# Patient Record
Sex: Female | Born: 1935
Health system: Southern US, Community
[De-identification: ages and names within clinical notes are randomized; demographics above are authoritative.]

## PROBLEM LIST (undated history)

## (undated) DIAGNOSIS — IMO0002 Reserved for concepts with insufficient information to code with codable children: Secondary | ICD-10-CM

## (undated) DIAGNOSIS — E079 Disorder of thyroid, unspecified: Secondary | ICD-10-CM

## (undated) DIAGNOSIS — H409 Unspecified glaucoma: Secondary | ICD-10-CM

## (undated) DIAGNOSIS — B019 Varicella without complication: Secondary | ICD-10-CM

## (undated) DIAGNOSIS — I10 Essential (primary) hypertension: Secondary | ICD-10-CM

## (undated) DIAGNOSIS — G709 Myoneural disorder, unspecified: Secondary | ICD-10-CM

## (undated) DIAGNOSIS — H35039 Hypertensive retinopathy, unspecified eye: Secondary | ICD-10-CM

## (undated) DIAGNOSIS — E119 Type 2 diabetes mellitus without complications: Secondary | ICD-10-CM

## (undated) HISTORY — PX: CATARACT EXTRACTION: SUR2

## (undated) HISTORY — DX: Hypertensive retinopathy, unspecified eye: H35.039

## (undated) HISTORY — PX: EYE SURGERY: SHX253

## (undated) HISTORY — DX: Essential (primary) hypertension: I10

## (undated) HISTORY — DX: Unspecified glaucoma: H40.9

## (undated) HISTORY — DX: Varicella without complication: B01.9

## (undated) HISTORY — DX: Reserved for concepts with insufficient information to code with codable children: IMO0002

## (undated) HISTORY — DX: Type 2 diabetes mellitus without complications: E11.9

## (undated) HISTORY — DX: Myoneural disorder, unspecified: G70.9

## (undated) HISTORY — PX: OOPHORECTOMY: SHX86

## (undated) HISTORY — DX: Disorder of thyroid, unspecified: E07.9

## (undated) HISTORY — PX: OTHER SURGICAL HISTORY: SHX169

---

## 1988-12-28 HISTORY — PX: ABDOMINAL HYSTERECTOMY: SHX81

## 1998-07-17 ENCOUNTER — Other Ambulatory Visit: Admission: RE | Admit: 1998-07-17 | Discharge: 1998-07-17 | Payer: Self-pay | Admitting: Internal Medicine

## 1998-09-24 ENCOUNTER — Other Ambulatory Visit: Admission: RE | Admit: 1998-09-24 | Discharge: 1998-09-24 | Payer: Self-pay | Admitting: Obstetrics and Gynecology

## 1999-09-26 ENCOUNTER — Other Ambulatory Visit: Admission: RE | Admit: 1999-09-26 | Discharge: 1999-09-26 | Payer: Self-pay | Admitting: Obstetrics and Gynecology

## 2000-10-07 ENCOUNTER — Other Ambulatory Visit: Admission: RE | Admit: 2000-10-07 | Discharge: 2000-10-07 | Payer: Self-pay | Admitting: Obstetrics and Gynecology

## 2001-06-23 ENCOUNTER — Ambulatory Visit (HOSPITAL_COMMUNITY): Admission: RE | Admit: 2001-06-23 | Discharge: 2001-06-23 | Payer: Self-pay | Admitting: Internal Medicine

## 2001-06-23 ENCOUNTER — Encounter: Payer: Self-pay | Admitting: Internal Medicine

## 2001-10-20 ENCOUNTER — Other Ambulatory Visit: Admission: RE | Admit: 2001-10-20 | Discharge: 2001-10-20 | Payer: Self-pay | Admitting: Obstetrics and Gynecology

## 2002-06-01 ENCOUNTER — Emergency Department (HOSPITAL_COMMUNITY): Admission: EM | Admit: 2002-06-01 | Discharge: 2002-06-01 | Payer: Self-pay | Admitting: Emergency Medicine

## 2002-09-25 ENCOUNTER — Encounter: Payer: Self-pay | Admitting: Internal Medicine

## 2002-09-25 ENCOUNTER — Ambulatory Visit (HOSPITAL_COMMUNITY): Admission: RE | Admit: 2002-09-25 | Discharge: 2002-09-25 | Payer: Self-pay | Admitting: Internal Medicine

## 2002-10-23 ENCOUNTER — Other Ambulatory Visit: Admission: RE | Admit: 2002-10-23 | Discharge: 2002-10-23 | Payer: Self-pay | Admitting: Obstetrics and Gynecology

## 2003-10-31 ENCOUNTER — Ambulatory Visit (HOSPITAL_COMMUNITY): Admission: RE | Admit: 2003-10-31 | Discharge: 2003-10-31 | Payer: Self-pay | Admitting: Internal Medicine

## 2005-01-07 ENCOUNTER — Other Ambulatory Visit: Admission: RE | Admit: 2005-01-07 | Discharge: 2005-01-07 | Payer: Self-pay | Admitting: Obstetrics and Gynecology

## 2006-08-17 ENCOUNTER — Ambulatory Visit (HOSPITAL_COMMUNITY): Admission: RE | Admit: 2006-08-17 | Discharge: 2006-08-17 | Payer: Self-pay | Admitting: Internal Medicine

## 2008-01-04 ENCOUNTER — Other Ambulatory Visit: Admission: RE | Admit: 2008-01-04 | Discharge: 2008-01-04 | Payer: Self-pay | Admitting: Obstetrics and Gynecology

## 2009-04-29 ENCOUNTER — Ambulatory Visit: Payer: Self-pay | Admitting: Obstetrics and Gynecology

## 2009-09-19 ENCOUNTER — Emergency Department (HOSPITAL_BASED_OUTPATIENT_CLINIC_OR_DEPARTMENT_OTHER): Admission: EM | Admit: 2009-09-19 | Discharge: 2009-09-19 | Payer: Self-pay | Admitting: Emergency Medicine

## 2009-09-19 ENCOUNTER — Ambulatory Visit: Payer: Self-pay | Admitting: Radiology

## 2010-01-24 ENCOUNTER — Ambulatory Visit (HOSPITAL_COMMUNITY): Admission: RE | Admit: 2010-01-24 | Discharge: 2010-01-24 | Payer: Self-pay | Admitting: Internal Medicine

## 2010-05-07 ENCOUNTER — Ambulatory Visit: Payer: Self-pay | Admitting: Obstetrics and Gynecology

## 2010-05-07 ENCOUNTER — Other Ambulatory Visit: Admission: RE | Admit: 2010-05-07 | Discharge: 2010-05-07 | Payer: Self-pay | Admitting: Obstetrics and Gynecology

## 2011-04-03 LAB — URINE MICROSCOPIC-ADD ON

## 2011-04-03 LAB — URINALYSIS, ROUTINE W REFLEX MICROSCOPIC
Glucose, UA: NEGATIVE mg/dL
Hgb urine dipstick: NEGATIVE
Protein, ur: 100 mg/dL — AB
Specific Gravity, Urine: 1.015 (ref 1.005–1.030)
Urobilinogen, UA: 0.2 mg/dL (ref 0.0–1.0)

## 2011-04-03 LAB — BASIC METABOLIC PANEL
BUN: 14 mg/dL (ref 6–23)
Calcium: 10.3 mg/dL (ref 8.4–10.5)
Creatinine, Ser: 0.9 mg/dL (ref 0.4–1.2)
GFR calc Af Amer: >60 mL/min (ref >60)

## 2011-04-03 LAB — DIFFERENTIAL
Basophils Relative: 1 % (ref 0–1)
Eosinophils Absolute: 0.1 10*3/uL (ref 0.0–0.7)
Lymphs Abs: 1.8 10*3/uL (ref 0.7–4.0)
Monocytes Relative: 4 % (ref 3–12)
Neutro Abs: 8.4 10*3/uL — ABNORMAL HIGH (ref 1.7–7.7)
Neutrophils Relative %: 77 % (ref 43–77)

## 2011-04-03 LAB — CBC
Platelets: 166 10*3/uL (ref 150–400)
RBC: 5.21 MIL/uL — ABNORMAL HIGH (ref 3.87–5.11)
WBC: 10.8 10*3/uL — ABNORMAL HIGH (ref 4.0–10.5)

## 2011-05-28 ENCOUNTER — Encounter (INDEPENDENT_AMBULATORY_CARE_PROVIDER_SITE_OTHER): Payer: Medicare Other | Admitting: Obstetrics and Gynecology

## 2011-05-28 DIAGNOSIS — R82998 Other abnormal findings in urine: Secondary | ICD-10-CM

## 2011-05-28 DIAGNOSIS — N8111 Cystocele, midline: Secondary | ICD-10-CM

## 2011-05-28 DIAGNOSIS — N952 Postmenopausal atrophic vaginitis: Secondary | ICD-10-CM

## 2011-05-28 DIAGNOSIS — R350 Frequency of micturition: Secondary | ICD-10-CM

## 2011-06-30 ENCOUNTER — Encounter: Payer: Self-pay | Admitting: *Deleted

## 2012-01-11 ENCOUNTER — Encounter: Payer: Self-pay | Admitting: Obstetrics and Gynecology

## 2012-03-15 ENCOUNTER — Ambulatory Visit (INDEPENDENT_AMBULATORY_CARE_PROVIDER_SITE_OTHER): Payer: Medicare Other | Admitting: Obstetrics and Gynecology

## 2012-03-15 DIAGNOSIS — N898 Other specified noninflammatory disorders of vagina: Secondary | ICD-10-CM

## 2012-03-15 DIAGNOSIS — D4959 Neoplasm of unspecified behavior of other genitourinary organ: Secondary | ICD-10-CM

## 2012-03-15 NOTE — Progress Notes (Signed)
Patient came to see me today with a two-day history of brown spotting from her vagina. She noticed it when she wiped. She is status post TAH/BSO. She does not take HRT.  Exam: Kennon Portela present. External: Within normal limits. BUS: Within normal limits. Vaginal exam: There is a large 3 cm lesion attached to the top of the vagina on the right lateral wall. It is attached by a pedicle. It is brownish and almost looks like an old blood clot but is clearly a lesion which is firmly attached. Cervix and uterus are absent. Bimanual fails to reveal masses.  Assessment: The lesion was grasped with a sponge stick and put on tension. There is really no way to anesthetize the pedicle. The pedicle was cut with a scissors. The patient had no discomfort. There was bleeding. It was controlled with Monsel's and pressure. The lesion was sent to pathology for tissue diagnosis. We will call the patient with the results.

## 2012-06-07 ENCOUNTER — Ambulatory Visit (INDEPENDENT_AMBULATORY_CARE_PROVIDER_SITE_OTHER): Payer: Medicare Other | Admitting: Obstetrics and Gynecology

## 2012-06-07 ENCOUNTER — Encounter: Payer: Self-pay | Admitting: Obstetrics and Gynecology

## 2012-06-07 VITALS — BP 130/78 | Ht 63.0 in | Wt 142.0 lb

## 2012-06-07 DIAGNOSIS — D219 Benign neoplasm of connective and other soft tissue, unspecified: Secondary | ICD-10-CM | POA: Insufficient documentation

## 2012-06-07 DIAGNOSIS — I10 Essential (primary) hypertension: Secondary | ICD-10-CM | POA: Insufficient documentation

## 2012-06-07 DIAGNOSIS — E079 Disorder of thyroid, unspecified: Secondary | ICD-10-CM | POA: Insufficient documentation

## 2012-06-07 DIAGNOSIS — Z780 Asymptomatic menopausal state: Secondary | ICD-10-CM

## 2012-06-07 DIAGNOSIS — M8580 Other specified disorders of bone density and structure, unspecified site: Secondary | ICD-10-CM | POA: Insufficient documentation

## 2012-06-07 DIAGNOSIS — N8111 Cystocele, midline: Secondary | ICD-10-CM

## 2012-06-07 DIAGNOSIS — M899 Disorder of bone, unspecified: Secondary | ICD-10-CM

## 2012-06-07 DIAGNOSIS — R3915 Urgency of urination: Secondary | ICD-10-CM

## 2012-06-07 DIAGNOSIS — IMO0002 Reserved for concepts with insufficient information to code with codable children: Secondary | ICD-10-CM

## 2012-06-07 DIAGNOSIS — R351 Nocturia: Secondary | ICD-10-CM

## 2012-06-07 NOTE — Progress Notes (Signed)
Patient came to see me today for further followup. We have been watching her with a large cystocele. She is aware of it but does not feel require surgical intervention. She does have some urgency of urination. She does have nocturia x2. She does not have incontinence. She does not have dysuria. She still gets hot flashes. She feels there are acceptable without treatment. She is having no vaginal bleeding. She is having no pelvic pain. She does have atrophic vaginitis but once again does not need intervention. She is up-to-date on her mammograms. She does her lab through PCP. She does have osteopenia. Her last bone density was stable without an elevated FRAX risk. She has had no fractures.  ROS: 12 system review done. Pertinent positives above. Other positives are Hypothyroidism and hypertension.  HEENT: Within normal limits. Neck: No masses. Supraclavicular lymph nodes: Not enlarged. Breasts: Examined in both sitting and lying position. Symmetrical without skin changes or masses. Abdomen: Soft no masses guarding or rebound. No hernias. Pelvic: External within normal limits. BUS within normal limits. Vaginal examination shows poor estrogen effect, 2 and a half degree cystocele. Cervix and uterus absent. Adnexa within normal limits. Rectovaginal confirmatory. Extremities within normal limits. Skin: many moles on anterior chest wall. One is elevated and black.  Assessment: #1. Menopausal symptoms #2. Osteopenia #3. Cystocele #4. Atrophic vaginitis #5. Urinary urgency and frequency. #6.moles  of chest wall.  Plan: Continue yearly mammograms. Bone density. Observation of bladder and bladder symptoms. Asked patient to see a dermatologist for moles.

## 2013-01-10 ENCOUNTER — Ambulatory Visit (INDEPENDENT_AMBULATORY_CARE_PROVIDER_SITE_OTHER): Payer: Medicare Other | Admitting: Gynecology

## 2013-01-10 ENCOUNTER — Encounter: Payer: Self-pay | Admitting: Gynecology

## 2013-01-10 DIAGNOSIS — N952 Postmenopausal atrophic vaginitis: Secondary | ICD-10-CM

## 2013-01-10 DIAGNOSIS — N8111 Cystocele, midline: Secondary | ICD-10-CM

## 2013-01-10 NOTE — Patient Instructions (Signed)
Monitor symptoms. If acceptable then follow up in June when you're due for your annual exam. If unacceptable then follow up sooner to consider surgery or pessary use.

## 2013-01-10 NOTE — Progress Notes (Signed)
Patient presents complaining of something bulging from her vagina. Notes that over the last month or so. Does not seem to be getting worse. She does note that she is helping to lift her 120 pound granddaughter and needs help at home due to her sickle cell crises and that seems to coincide with this. No urinary symptoms such as incontinence frequency dysuria or urgency.  She does have noted in her chart from Dr. Eda Paschal a cystocele initially in 2012 and again in 2013 were it's described as a second degree. Per her history she has never really discussed this with Dr. Eda Paschal before.  Exam with kim assistant External BUS vagina with second degree cystocele to the introital opening. Vaginal cuff appears well supported. First degree rectocele. Generalized atrophic changes noted. Bimanual without masses or tenderness. Rectovaginal exam confirms above.  Assessment and plan: Cystocele symptomatic to the patient for the last month following episodes of heavy lifting. I reviewed the pictures with involved with a cystocele. Cuff appears well supported. Does have mild rectocele. Options for management include observation, pessary, surgical repair possible mesh discussed. At this point recommended observation as her symptoms are recent and we'll see how she does over the next month or 2. If she wants to consider pessary she'll represent for fitting. If she's interested in surgery I'm going to refer to urology to consider mesh. She does not have associated urinary symptoms such as incontinence or urgency.

## 2013-06-29 ENCOUNTER — Ambulatory Visit (INDEPENDENT_AMBULATORY_CARE_PROVIDER_SITE_OTHER): Payer: Medicare Other | Admitting: Gynecology

## 2013-06-29 ENCOUNTER — Encounter: Payer: Self-pay | Admitting: Gynecology

## 2013-06-29 VITALS — BP 120/70 | Ht 64.0 in | Wt 136.0 lb

## 2013-06-29 DIAGNOSIS — N816 Rectocele: Secondary | ICD-10-CM

## 2013-06-29 DIAGNOSIS — M8580 Other specified disorders of bone density and structure, unspecified site: Secondary | ICD-10-CM

## 2013-06-29 DIAGNOSIS — M899 Disorder of bone, unspecified: Secondary | ICD-10-CM

## 2013-06-29 DIAGNOSIS — N8111 Cystocele, midline: Secondary | ICD-10-CM

## 2013-06-29 NOTE — Patient Instructions (Signed)
Followup for bone density as scheduled. Call me if you want to pursue treatment for the cystocele.

## 2013-06-29 NOTE — Progress Notes (Signed)
Alejandra Davis 11-10-1936 [id number]        77 y.o.  [id number] for followup exam.    Past medical history,surgical history, medications, allergies, family history and social history were all reviewed and documented in the EPIC chart.  ROS:  Performed and pertinent positives and negatives are included in the history, assessment and plan .  Exam: Kim assistant Filed Vitals:   06/29/13 1439  BP: 120/70  Height: 5\' 4"  (1.626 m)  Weight: 136 lb (61.689 kg)   General appearance  Normal Skin grossly normal Head/Neck normal with no cervical or supraclavicular adenopathy thyroid normal Lungs  clear Cardiac RR, without RMG Abdominal  soft, nontender, without masses, organomegaly or hernia Breasts  examined lying and sitting without masses, retractions, discharge or axillary adenopathy. Pelvic  Ext/BUS/vagina  with atrophic changes. Large cystocele 2 introital opening. Vaginal cuff appears well supported. Slight rectocele on digital exam.  Adnexa  Without masses or tenderness    Anus and perineum  normal   Rectovaginal  normal sphincter tone without palpated masses or tenderness.    Assessment/Plan:  77 y.o. [id number] female for followup exam.   1. Cystocele. Involving the whole anterior vaginal wall. The level of the introitus. Does not appear significantly worse than last exam. Patient still is bothered somewhat with it but not overly bothered. Not having significant urinary symptoms. I again reviewed options of observation, pessary trial and surgery. Recommendation to be evaluated for possible mesh with referral to another physician also discussed. Patient wants to wait and observe at this time. She will call me she was to pursue either pessary or surgical referral. 2. Atrophic vaginal changes. Asymptomatic from an irritated standpoint or dyspareunia. We'll continue to monitor. 3. Osteopenia. DEXA 2008 with T score -1.5. FRAX 6.1%/0.6%. Recommend repeat DEXA now she agrees to schedule. Increase  calcium and vitamin D discussed. 4. Mammography reported January 2014. We do not have a copy of that report and we'll try to get one. Patient clearly murmurs having it done this year. SBE monthly reviewed. Continue with annual mammography. 5. Pap smear 2011. No Pap smear done today. No history of significant abnormal Pap smears. Reviewed current screening guidelines. She's at age 57 and status post hysterectomy for benign indications we both agree on stop screening and she is comfortable with this. 6. Colonoscopy 6 years ago. Plan repeat at 10 year interval. 7. Health maintenance. No blood work done this is all done through her primary physician's office. Followup for bone density. Followup if she wants to pursue treatment of her cystocele. Otherwise followup in 1 year.  Note: This document was prepared with digital dictation and possible smart phrase technology. Any transcriptional errors that result from this process are unintentional.   Dara Lords MD, 3:12 PM 06/29/2013   Her

## 2013-06-30 LAB — URINALYSIS W MICROSCOPIC + REFLEX CULTURE
Bacteria, UA: NONE SEEN
Bilirubin Urine: NEGATIVE
Glucose, UA: NEGATIVE mg/dL
Hgb urine dipstick: NEGATIVE
Protein, ur: NEGATIVE mg/dL
Urobilinogen, UA: 1 mg/dL (ref 0.0–1.0)

## 2013-07-11 ENCOUNTER — Encounter: Payer: Self-pay | Admitting: Gynecology

## 2013-07-11 ENCOUNTER — Ambulatory Visit (INDEPENDENT_AMBULATORY_CARE_PROVIDER_SITE_OTHER): Payer: Medicare Other

## 2013-07-11 DIAGNOSIS — M8580 Other specified disorders of bone density and structure, unspecified site: Secondary | ICD-10-CM

## 2013-07-11 DIAGNOSIS — Z780 Asymptomatic menopausal state: Secondary | ICD-10-CM

## 2013-07-26 ENCOUNTER — Telehealth: Payer: Self-pay | Admitting: *Deleted

## 2013-07-26 NOTE — Telephone Encounter (Signed)
Pt called stating she would like # of urologist per TF note # given for [facility] for pt to make appt. I faxed notes to office.

## 2013-12-18 ENCOUNTER — Other Ambulatory Visit: Payer: Self-pay | Admitting: Gastroenterology

## 2013-12-18 DIAGNOSIS — R109 Unspecified abdominal pain: Secondary | ICD-10-CM

## 2013-12-19 ENCOUNTER — Ambulatory Visit
Admission: RE | Admit: 2013-12-19 | Discharge: 2013-12-19 | Disposition: A | Discharge status: Home / Self Care | Payer: Medicare Other | Source: Ambulatory Visit | Attending: Gastroenterology | Admitting: Gastroenterology

## 2013-12-19 DIAGNOSIS — R109 Unspecified abdominal pain: Secondary | ICD-10-CM

## 2014-08-17 ENCOUNTER — Ambulatory Visit (INDEPENDENT_AMBULATORY_CARE_PROVIDER_SITE_OTHER): Payer: Medicare Other | Admitting: Family Medicine

## 2014-08-17 VITALS — BP 122/80 | HR 64 | Temp 97.4°F | Resp 16 | Ht 63.0 in | Wt 142.4 lb

## 2014-08-17 DIAGNOSIS — R040 Epistaxis: Secondary | ICD-10-CM

## 2014-08-17 LAB — POCT CBC
GRANULOCYTE PERCENT: 71.3 % (ref 37–80)
HCT, POC: 45.3 % (ref 37.7–47.9)
Hemoglobin: 14.6 g/dL (ref 12.2–16.2)
Lymph, poc: 1.8 (ref 0.6–3.4)
MCH, POC: 26.6 pg — AB (ref 27–31.2)
MCHC: 32.2 g/dL (ref 31.8–35.4)
MCV: 82.6 fL (ref 80–97)
MID (CBC): 0.3 (ref 0–0.9)
MPV: 7.8 fL (ref 0–99.8)
PLATELET COUNT, POC: 247 10*3/uL (ref 142–424)
POC Granulocyte: 5.2 (ref 2–6.9)
POC LYMPH PERCENT: 25 %L (ref 10–50)
POC MID %: 3.7 % (ref 0–12)
RBC: 5.49 M/uL — AB (ref 4.04–5.48)
RDW, POC: 16.1 %
WBC: 7.3 10*3/uL (ref 4.6–10.2)

## 2014-08-17 NOTE — Progress Notes (Signed)
Subjective:  Pleasant 78 year old lady who lives alone. Today she had picked up a few branches in the yard. She was indoors getting right to clean her bathroom, had not yet started bending over or anything. She began to notice her nose was running, and then realized it was blood coming from the left nostril. She put some wet tissue up in the nose, and after 5 or 10 minutes the bleeding ceased. She is on high blood pressure medication which she takes regularly. She is not taking aspirin.  Objective: Blood pressure is good. TMs normal. Nose clear except for tiny specks of red on the left septum, no apparent site of bleeding could be noted. Throat clear. Chest clear. Heart regular without murmurs.  Assessment: Left-sided epistaxis History of hypertension, well-controlled  Plan: A couple times daily for a few days. Return if further bleeding. CBC Results for orders placed in visit on 08/17/14  POCT CBC      Result Value Ref Range   WBC 7.3  4.6 - 10.2 K/uL   Lymph, poc 1.8  0.6 - 3.4   POC LYMPH PERCENT 25.0  10 - 50 %L   MID (cbc) 0.3  0 - 0.9   POC MID % 3.7  0 - 12 %M   POC Granulocyte 5.2  2 - 6.9   Granulocyte percent 71.3  37 - 80 %G   RBC 5.49 (*) 4.04 - 5.48 M/uL   Hemoglobin 14.6  12.2 - 16.2 g/dL   HCT, POC 45.3  37.7 - 47.9 %   MCV 82.6  80 - 97 fL   MCH, POC 26.6 (*) 27 - 31.2 pg   MCHC 32.2  31.8 - 35.4 g/dL   RDW, POC 16.1     Platelet Count, POC 247  142 - 424 K/uL   MPV 7.8  0 - 99.8 fL   Platelets were normal. Patient reassured

## 2014-08-17 NOTE — Patient Instructions (Signed)
In the event of bleeding of the nose apply pressure pinching the nose as demonstrated. If bleeding persists after 10 minutes, then consider getting checked.  Avoid aspirin because of the blood thinner effect  Use a tiny bit of Vaseline in the medicines as directed    Nosebleed Nosebleeds can be caused by many conditions, including trauma, infections, polyps, foreign bodies, dry mucous membranes or climate, medicines, and air conditioning. Most nosebleeds occur in the front of the nose. Because of this location, most nosebleeds can be controlled by pinching the nostrils gently and continuously for at least 10 to 20 minutes. The long, continuous pressure allows enough time for the blood to clot. If pressure is released during that 10 to 20 minute time period, the process may have to be started again. The nosebleed may stop by itself or quit with pressure, or it may need concentrated heating (cautery) or pressure from packing. HOME CARE INSTRUCTIONS   If your nose was packed, try to maintain the pack inside until your health care provider removes it. If a gauze pack was used and it starts to fall out, gently replace it or cut the end off. Do not cut if a balloon catheter was used to pack the nose. Otherwise, do not remove unless instructed.  Avoid blowing your nose for 12 hours after treatment. This could dislodge the pack or clot and start the bleeding again.  If the bleeding starts again, sit up and bend forward, gently pinching the front half of your nose continuously for 20 minutes.  If bleeding was caused by dry mucous membranes, use over-the-counter saline nasal spray or gel. This will keep the mucous membranes moist and allow them to heal. If you must use a lubricant, choose the water-soluble variety. Use it only sparingly and not within several hours of lying down.  Do not use petroleum jelly or mineral oil, as these may drip into the lungs and cause serious problems.  Maintain humidity in  your home by using less air conditioning or by using a humidifier.  Do not use aspirin or medicines which make bleeding more likely. Your health care provider can give you recommendations on this.  Resume normal activities as you are able, but try to avoid straining, lifting, or bending at the waist for several days.  If the nosebleeds become recurrent and the cause is unknown, your health care provider may suggest laboratory tests. SEEK MEDICAL CARE IF: You have a fever. SEEK IMMEDIATE MEDICAL CARE IF:   Bleeding recurs and cannot be controlled.  There is unusual bleeding from or bruising on other parts of the body.  Nosebleeds continue.  There is any worsening of the condition which originally brought you in.  You become light-headed, feel faint, become sweaty, or vomit blood. MAKE SURE YOU:   Understand these instructions.  Will watch your condition.  Will get help right away if you are not doing well or get worse. Document Released: 09/23/2005 Document Revised: 04/30/2014 Document Reviewed: 11/14/2009 [facility] Patient Information 2015 [place], [place]. This information is not intended to replace advice given to you by your health care provider. Make sure you discuss any questions you have with your health care provider.

## 2014-09-17 ENCOUNTER — Other Ambulatory Visit: Payer: Self-pay | Admitting: Internal Medicine

## 2014-09-17 DIAGNOSIS — R109 Unspecified abdominal pain: Secondary | ICD-10-CM

## 2014-09-21 ENCOUNTER — Ambulatory Visit (HOSPITAL_COMMUNITY): Payer: Medicare Other

## 2014-09-24 ENCOUNTER — Ambulatory Visit (HOSPITAL_COMMUNITY)
Admission: RE | Admit: 2014-09-24 | Discharge: 2014-09-24 | Disposition: A | Discharge status: Home / Self Care | Payer: Medicare Other | Source: Ambulatory Visit | Attending: Diagnostic Radiology | Admitting: Diagnostic Radiology

## 2014-09-24 DIAGNOSIS — R109 Unspecified abdominal pain: Secondary | ICD-10-CM | POA: Insufficient documentation

## 2014-09-24 DIAGNOSIS — Q618 Other cystic kidney diseases: Secondary | ICD-10-CM | POA: Diagnosis not present

## 2014-10-05 ENCOUNTER — Encounter: Payer: Self-pay | Admitting: Gynecology

## 2014-10-05 ENCOUNTER — Ambulatory Visit (INDEPENDENT_AMBULATORY_CARE_PROVIDER_SITE_OTHER): Payer: Medicare Other | Admitting: Gynecology

## 2014-10-05 VITALS — BP 124/80 | Ht 63.0 in | Wt 143.0 lb

## 2014-10-05 DIAGNOSIS — N8111 Cystocele, midline: Secondary | ICD-10-CM

## 2014-10-05 DIAGNOSIS — N952 Postmenopausal atrophic vaginitis: Secondary | ICD-10-CM

## 2014-10-05 DIAGNOSIS — M8580 Other specified disorders of bone density and structure, unspecified site: Secondary | ICD-10-CM

## 2014-10-05 NOTE — Progress Notes (Signed)
Alejandra Davis 1936-11-06 [id number]        77 y.o.  [id number] for follow up exam. Several issues noted below.  Past medical history,surgical history, problem list, medications, allergies, family history and social history were all reviewed and documented as reviewed in the EPIC chart.  ROS:  12 system ROS performed with pertinent positives and negatives included in the history, assessment and plan.   Additional significant findings :  none   Exam: Kim Counsellor Vitals:   10/05/14 0953  BP: 124/80  Height: 5\' 3"  (1.6 m)  Weight: 143 lb (64.864 kg)   General appearance:  Normal affect, orientation and appearance. Skin: Grossly normal HEENT: Without gross lesions.  No cervical or supraclavicular adenopathy. Thyroid normal.  Lungs:  Clear without wheezing, rales or rhonchi Cardiac: RR, without RMG Abdominal:  Soft, nontender, without masses, guarding, rebound, organomegaly or hernia Breasts:  Examined lying and sitting without masses, retractions, discharge or axillary adenopathy. Pelvic:  Ext/BUS/vagina with generalized atrophic changes. Second degree cystocele to the level of the introitus. Cuff well supported. No significant rectocele  Adnexa  Without masses or tenderness    Anus and perineum  Normal   Rectovaginal  Normal sphincter tone without palpated masses or tenderness.    Assessment/Plan:  78 y.o. [id number] female for follow up exam.   1. Postmenopausal/atrophic genital changes. Status post TAH/BSO.  Patient without significant symptoms of hot flushes, night sweats, vaginal dryness. Not sexually active.  Continue to monitor. 2. Cystocele. Second degree cystocele stable over serial exams. Patient not overly bothered by this. Options of observation versus pessary versus surgery reviewed. Patient not interested in intervention at this time. We'll follow up becomes an issue. 3. Lower abdominal discomfort times several days. No nausea vomiting diarrhea constipation. No urinary  symptoms such as frequency dysuria or urgency. Exam is normal. Recommended patient follow up with her primary physician if this pain continues for further evaluation. She is status post TAH/BSO and do not feel further GYN evaluation necessary.  Check urinalysis today. 4. Osteopenia. History of osteopenia with DEXA 2008 T score -1.5. Follow up DEXA 2014 normal. Plan expectant management with repeat DEXA at 3-5 year interval. 5. Pap smear 2011. No Pap smear done today. No history of abnormal Pap smears. Status post TAH for benign indications. Over the age of 55. We both agree to stop screening and she is comfortable with this per current screening guidelines. 6. Colonoscopy 8 years ago.  Recommended repeat interval reported 10 years. 7. Mammography 2013.  Strongly recommended patient schedule now and she agrees to do so. SBE monthly reviewed. 8. Health maintenance. No routine blood work done as she reports this done through her primary physician's office. Follow up one year, sooner as needed.     Anastasio Auerbach MD, 10:18 AM 10/05/2014

## 2014-10-05 NOTE — Patient Instructions (Signed)
Call to Schedule your mammogram  Facilities in [place]: 1)  [facility], [address] Phone: [phone] 2)  [facility]. [facility], [address] Phone: [phone] 3)  Dr[facility] Bertrand at [facility]  [address] Phone: [phone]     Mammogram A mammogram is an X-ray test to find changes in a woman's breast. You should get a mammogram if:  You are 40 years of age or older  You have risk factors.   Your doctor recommends that you have one.  BEFORE THE TEST  Do not schedule the test the week before your period, especially if your breasts are sore during this time.  On the day of your mammogram:  Wash your breasts and armpits well. After washing, do not put on any deodorant or talcum powder on until after your test.   Eat and drink as you usually do.   Take your medicines as usual.   If you are diabetic and take insulin, make sure you:   Eat before coming for your test.   Take your insulin as usual.   If you cannot keep your appointment, call before the appointment to cancel. Schedule another appointment.  TEST  You will need to undress from the waist up. You will put on a hospital gown.   Your breast will be put on the mammogram machine, and it will press firmly on your breast with a piece of plastic called a compression paddle. This will make your breast flatter so that the machine can X-ray all parts of your breast.   Both breasts will be X-rayed. Each breast will be X-rayed from above and from the side. An X-ray might need to be taken again if the picture is not good enough.   The mammogram will last about 15 to 30 minutes.  AFTER THE TEST Finding out the results of your test Ask when your test results will be ready. Make sure you get your test results.  Document Released: 03/12/2009 Document Revised: 12/03/2011 Document Reviewed: 03/12/2009 Exit[employer] Patient  Information 2012 [employer].  You may obtain a copy of any labs that were done today by logging onto MyChart as outlined in the instructions provided with your AVS (after visit summary). The office will not call with normal lab results but certainly if there are any significant abnormalities then we will contact you.   Health Maintenance, Female A healthy lifestyle and preventative care can promote health and wellness.  Maintain regular health, dental, and eye exams.  Eat a healthy diet. Foods like vegetables, fruits, whole grains, low-fat dairy products, and lean protein foods contain the nutrients you need without too many calories. Decrease your intake of foods high in solid fats, added sugars, and salt. Get information about a proper diet from your caregiver, if necessary.  Regular physical exercise is one of the most important things you can do for your health. Most adults should get at least 150 minutes of moderate-intensity exercise (any activity that increases your heart rate and causes you to sweat) each week. In addition, most adults need muscle-strengthening exercises on 2 or more days a week.   Maintain a healthy weight. The body mass index (BMI) is a screening tool to identify possible weight problems. It provides an estimate of body fat based on height and weight. Your caregiver can help determine your BMI, and can help you achieve or maintain a healthy weight. For adults   20 years and older:  A BMI below 18.5 is considered underweight.  A BMI of 18.5 to 24.9 is normal.  A BMI of 25 to 29.9 is considered overweight.  A BMI of 30 and above is considered obese.  Maintain normal blood lipids and cholesterol by exercising and minimizing your intake of saturated fat. Eat a balanced diet with plenty of fruits and vegetables. Blood tests for lipids and cholesterol should begin at age 20 and be repeated every 5 years. If your lipid or cholesterol levels are high, you are over 50, or  you are a high risk for heart disease, you may need your cholesterol levels checked more frequently.Ongoing high lipid and cholesterol levels should be treated with medicines if diet and exercise are not effective.  If you smoke, find out from your caregiver how to quit. If you do not use tobacco, do not start.  Lung cancer screening is recommended for adults aged 55 80 years who are at high risk for developing lung cancer because of a history of smoking. Yearly low-dose computed tomography (CT) is recommended for people who have at least a 30-pack-year history of smoking and are a current smoker or have quit within the past 15 years. A pack year of smoking is smoking an average of 1 pack of cigarettes a day for 1 year (for example: 1 pack a day for 30 years or 2 packs a day for 15 years). Yearly screening should continue until the smoker has stopped smoking for at least 15 years. Yearly screening should also be stopped for people who develop a health problem that would prevent them from having lung cancer treatment.  If you are pregnant, do not drink alcohol. If you are breastfeeding, be very cautious about drinking alcohol. If you are not pregnant and choose to drink alcohol, do not exceed 1 drink per day. One drink is considered to be 12 ounces (355 mL) of beer, 5 ounces (148 mL) of wine, or 1.5 ounces (44 mL) of liquor.  Avoid use of street drugs. Do not share needles with anyone. Ask for help if you need support or instructions about stopping the use of drugs.  High blood pressure causes heart disease and increases the risk of stroke. Blood pressure should be checked at least every 1 to 2 years. Ongoing high blood pressure should be treated with medicines, if weight loss and exercise are not effective.  If you are 55 to 76 years old, ask your caregiver if you should take aspirin to prevent strokes.  Diabetes screening involves taking a blood sample to check your fasting blood sugar level. This  should be done once every 3 years, after age 45, if you are within normal weight and without risk factors for diabetes. Testing should be considered at a younger age or be carried out more frequently if you are overweight and have at least 1 risk factor for diabetes.  Breast cancer screening is essential preventative care for women. You should practice "breast self-awareness." This means understanding the normal appearance and feel of your breasts and may include breast self-examination. Any changes detected, no matter how small, should be reported to a caregiver. Women in their 20s and 30s should have a clinical breast exam (CBE) by a caregiver as part of a regular health exam every 1 to 3 years. After age 40, women should have a CBE every year. Starting at age 40, women should consider having a mammogram (breast X-ray) every year. Women who have a   family history of breast cancer should talk to their caregiver about genetic screening. Women at a high risk of breast cancer should talk to their caregiver about having an MRI and a mammogram every year.  Breast cancer gene (BRCA)-related cancer risk assessment is recommended for women who have family members with BRCA-related cancers. BRCA-related cancers include breast, ovarian, tubal, and peritoneal cancers. Having family members with these cancers may be associated with an increased risk for harmful changes (mutations) in the breast cancer genes BRCA1 and BRCA2. Results of the assessment will determine the need for genetic counseling and BRCA1 and BRCA2 testing.  The Pap test is a screening test for cervical cancer. Women should have a Pap test starting at age 21. Between ages 21 and 29, Pap tests should be repeated every 2 years. Beginning at age 30, you should have a Pap test every 3 years as long as the past 3 Pap tests have been normal. If you had a hysterectomy for a problem that was not cancer or a condition that could lead to cancer, then you no longer  need Pap tests. If you are between ages 65 and 70, and you have had normal Pap tests going back 10 years, you no longer need Pap tests. If you have had past treatment for cervical cancer or a condition that could lead to cancer, you need Pap tests and screening for cancer for at least 20 years after your treatment. If Pap tests have been discontinued, risk factors (such as a new sexual partner) need to be reassessed to determine if screening should be resumed. Some women have medical problems that increase the chance of getting cervical cancer. In these cases, your caregiver may recommend more frequent screening and Pap tests.  The human papillomavirus (HPV) test is an additional test that may be used for cervical cancer screening. The HPV test looks for the virus that can cause the cell changes on the cervix. The cells collected during the Pap test can be tested for HPV. The HPV test could be used to screen women aged 30 years and older, and should be used in women of any age who have unclear Pap test results. After the age of 30, women should have HPV testing at the same frequency as a Pap test.  Colorectal cancer can be detected and often prevented. Most routine colorectal cancer screening begins at the age of 50 and continues through age 75. However, your caregiver may recommend screening at an earlier age if you have risk factors for colon cancer. On a yearly basis, your caregiver may provide home test kits to check for hidden blood in the stool. Use of a small camera at the end of a tube, to directly examine the colon (sigmoidoscopy or colonoscopy), can detect the earliest forms of colorectal cancer. Talk to your caregiver about this at age 50, when routine screening begins. Direct examination of the colon should be repeated every 5 to 10 years through age 75, unless early forms of pre-cancerous polyps or small growths are found.  Hepatitis C blood testing is recommended for all people born from 1945  through 1965 and any individual with known risks for hepatitis C.  Practice safe sex. Use condoms and avoid high-risk sexual practices to reduce the spread of sexually transmitted infections (STIs). Sexually active women aged 25 and younger should be checked for Chlamydia, which is a common sexually transmitted infection. Older women with new or multiple partners should also be tested for Chlamydia. Testing for   other STIs is recommended if you are sexually active and at increased risk.  Osteoporosis is a disease in which the bones lose minerals and strength with aging. This can result in serious bone fractures. The risk of osteoporosis can be identified using a bone density scan. Women ages 65 and over and women at risk for fractures or osteoporosis should discuss screening with their caregivers. Ask your caregiver whether you should be taking a calcium supplement or vitamin D to reduce the rate of osteoporosis.  Menopause can be associated with physical symptoms and risks. Hormone replacement therapy is available to decrease symptoms and risks. You should talk to your caregiver about whether hormone replacement therapy is right for you.  Use sunscreen. Apply sunscreen liberally and repeatedly throughout the day. You should seek shade when your shadow is shorter than you. Protect yourself by wearing long sleeves, pants, a wide-brimmed hat, and sunglasses year round, whenever you are outdoors.  Notify your caregiver of new moles or changes in moles, especially if there is a change in shape or color. Also notify your caregiver if a mole is larger than the size of a pencil eraser.  Stay current with your immunizations. Document Released: 06/29/2011 Document Revised: 04/10/2013 Document Reviewed: 06/29/2011 Exit[employer] Patient Information 2014 [employer].   

## 2014-10-06 LAB — URINALYSIS W MICROSCOPIC + REFLEX CULTURE
BACTERIA UA: NONE SEEN
Bilirubin Urine: NEGATIVE
CRYSTALS: NONE SEEN
Casts: NONE SEEN
Glucose, UA: NEGATIVE mg/dL
Hgb urine dipstick: NEGATIVE
KETONES UR: NEGATIVE mg/dL
Leukocytes, UA: NEGATIVE
NITRITE: NEGATIVE
Protein, ur: NEGATIVE mg/dL
SPECIFIC GRAVITY, URINE: 1.011 (ref 1.005–1.030)
UROBILINOGEN UA: 0.2 mg/dL (ref 0.0–1.0)
pH: 6 (ref 5.0–8.0)

## 2014-10-19 ENCOUNTER — Other Ambulatory Visit: Payer: Self-pay | Admitting: Otolaryngology

## 2014-10-29 ENCOUNTER — Encounter: Payer: Self-pay | Admitting: Gynecology

## 2015-01-11 ENCOUNTER — Ambulatory Visit (INDEPENDENT_AMBULATORY_CARE_PROVIDER_SITE_OTHER): Payer: Medicare Other | Admitting: Women's Health

## 2015-01-11 ENCOUNTER — Encounter: Payer: Self-pay | Admitting: Women's Health

## 2015-01-11 VITALS — BP 134/70 | Ht 63.0 in | Wt 142.0 lb

## 2015-01-11 DIAGNOSIS — N898 Other specified noninflammatory disorders of vagina: Secondary | ICD-10-CM

## 2015-01-11 DIAGNOSIS — B3731 Acute candidiasis of vulva and vagina: Secondary | ICD-10-CM

## 2015-01-11 DIAGNOSIS — R350 Frequency of micturition: Secondary | ICD-10-CM

## 2015-01-11 DIAGNOSIS — B373 Candidiasis of vulva and vagina: Secondary | ICD-10-CM

## 2015-01-11 LAB — WET PREP FOR TRICH, YEAST, CLUE
CLUE CELLS WET PREP: NONE SEEN
TRICH WET PREP: NONE SEEN

## 2015-01-11 MED ORDER — FLUCONAZOLE 150 MG PO TABS: 150.0000 mg | ORAL_TABLET | Freq: Once | ORAL | Status: DC

## 2015-01-11 NOTE — Progress Notes (Signed)
Patient ID: Alejandra Davis, female   DOB: [birth year], 79 y.o.   MRN: [id number] Presents with complaint of increased vaginal discharge with itching. Reports slight burning upon initiation of urination, denies pain at end of stream of urination or increased frequency. Denies abdominal pain or fever . TAH with BSO.  Exam: Appears well. External genitalia +2 cystocele, reports mostly asymptomatic. Wet prep positive for yeast.  Yeast vaginitis Cystocele  Plan: Diflucan 150 by mouth 1 dose prescription, proper use given and reviewed. Briefly reviewed pessaries, declines. Instructed to call if no relief of discharge with itching. UA pending.

## 2015-01-11 NOTE — Patient Instructions (Signed)

## 2015-01-12 LAB — URINALYSIS W MICROSCOPIC + REFLEX CULTURE
BILIRUBIN URINE: NEGATIVE
CRYSTALS: NONE SEEN
Casts: NONE SEEN
GLUCOSE, UA: NEGATIVE mg/dL
HGB URINE DIPSTICK: NEGATIVE
KETONES UR: NEGATIVE mg/dL
NITRITE: NEGATIVE
PROTEIN: NEGATIVE mg/dL
SPECIFIC GRAVITY, URINE: 1.015 (ref 1.005–1.030)
UROBILINOGEN UA: 0.2 mg/dL (ref 0.0–1.0)
pH: 5.5 (ref 5.0–8.0)

## 2015-01-13 LAB — URINE CULTURE

## 2015-08-23 ENCOUNTER — Other Ambulatory Visit: Payer: Self-pay | Admitting: Gastroenterology

## 2015-11-11 ENCOUNTER — Encounter: Payer: Medicare Other | Admitting: Gynecology

## 2016-05-30 ENCOUNTER — Encounter (HOSPITAL_COMMUNITY): Payer: Self-pay | Admitting: *Deleted

## 2016-05-30 ENCOUNTER — Ambulatory Visit (HOSPITAL_COMMUNITY)
Admission: EM | Admit: 2016-05-30 | Discharge: 2016-05-30 | Disposition: A | Discharge status: Home / Self Care | Payer: Medicare Other | Attending: Family Medicine | Admitting: Family Medicine

## 2016-05-30 DIAGNOSIS — T148 Other injury of unspecified body region: Secondary | ICD-10-CM

## 2016-05-30 DIAGNOSIS — W57XXXA Bitten or stung by nonvenomous insect and other nonvenomous arthropods, initial encounter: Secondary | ICD-10-CM | POA: Diagnosis not present

## 2016-05-30 MED ORDER — FLUTICASONE PROPIONATE 0.05 % EX CREA: TOPICAL_CREAM | Freq: Two times a day (BID) | CUTANEOUS | Status: DC

## 2016-05-30 NOTE — ED Notes (Signed)
Assessment per Dr. Kindl. 

## 2016-05-30 NOTE — ED Provider Notes (Signed)
CSN: [id number]     Arrival date & time 05/30/16  1844 History   First MD Initiated Contact with Patient 05/30/16 1900     Chief Complaint  Patient presents with  . Rash   (Consider location/radiation/quality/duration/timing/severity/associated sxs/prior Treatment) Patient is a 80 y.o. female presenting with rash. The history is provided by the patient and a relative.  Rash Location:  Shoulder/arm Shoulder/arm rash location:  L wrist, R wrist, L forearm and R forearm Quality: blistering and itchiness   Severity:  Mild Onset quality:  Sudden Duration:  5 hours Progression:  Spreading Chronicity:  New Context: insect bite/sting   Relieved by:  None tried Worsened by:  Nothing tried Ineffective treatments:  None tried Associated symptoms: no fever     Past Medical History  Diagnosis Date  . Cystocele   . Thyroid disease     hypothyroidism  . Hypertension    Past Surgical History  Procedure Laterality Date  . Abdominal hysterectomy  1990    TAH BSO  . Oophorectomy      BSO  . Vaginal bx      Papilloma   Family History  Problem Relation Age of Onset  . Hypertension Mother   . Cancer Father     LIVER  . Heart disease Brother   . Cancer Brother     STOMACH  . Sickle cell anemia Other    Social History  Substance Use Topics  . Smoking status: Never Smoker   . Smokeless tobacco: None  . Alcohol Use: No   OB History    Gravida Para Term Preterm AB TAB SAB Ectopic Multiple Living   3 2 2  1  1   2      Review of Systems  Constitutional: Negative.  Negative for fever.  Skin: Positive for rash.  All other systems reviewed and are negative.   Allergies  Review of patient's allergies indicates no known allergies.  Home Medications   Prior to Admission medications   Medication Sig Start Date End Date Taking? Authorizing Provider  amLODipine (NORVASC) 10 MG tablet Take 10 mg by mouth daily.     Yes Historical Provider, MD  aspirin 81 MG tablet Take 81 mg by  mouth daily.   Yes Historical Provider, MD  Cholecalciferol (VITAMIN D PO) Take 1 tablet by mouth daily.    Yes Historical Provider, MD  levothyroxine (SYNTHROID, LEVOTHROID) 75 MCG tablet Take 75 mcg by mouth daily.     Yes Historical Provider, MD  metoprolol succinate (TOPROL-XL) 50 MG 24 hr tablet Take 1 tablet by mouth daily. 12/13/14  Yes Historical Provider, MD  Multiple Vitamin (MULTIVITAMIN) tablet Take 1 tablet by mouth daily.     Yes Historical Provider, MD  spironolactone (ALDACTONE) 25 MG tablet Take 25 mg by mouth daily.   Yes Historical Provider, MD  fluconazole (DIFLUCAN) 150 MG tablet Take 1 tablet (150 mg total) by mouth once. 01/11/15   Huel Cote, NP  fluticasone (CUTIVATE) 0.05 % cream Apply topically 2 (two) times daily. 05/30/16   Billy Fischer, MD  omeprazole (PRILOSEC) 40 MG capsule Take 40 mg by mouth daily.    Historical Provider, MD   Meds Ordered and Administered this Visit  Medications - No data to display  BP 167/88 mmHg  Pulse 65  Temp(Src) 98.2 F (36.8 C) (Oral)  SpO2 97% No data found.   Physical Exam  Constitutional: She appears well-developed and well-nourished. No distress.  Skin: Skin is warm and  dry. Rash noted.  Scattered papulovesicular lesions on bilat wrists/forearms.nonpustular, nontender.  Nursing note and vitals reviewed.   ED Course  Procedures (including critical care time)  Labs Review Labs Reviewed - No data to display  Imaging Review No results found.   Visual Acuity Review  Right Eye Distance:   Left Eye Distance:   Bilateral Distance:    Right Eye Near:   Left Eye Near:    Bilateral Near:         MDM   1. Multiple insect bites        Billy Fischer, MD 05/30/16 [phone]

## 2016-11-10 ENCOUNTER — Encounter: Payer: Self-pay | Admitting: *Deleted

## 2016-11-10 ENCOUNTER — Encounter: Payer: Medicare Other | Attending: Internal Medicine | Admitting: *Deleted

## 2016-11-10 DIAGNOSIS — Z713 Dietary counseling and surveillance: Secondary | ICD-10-CM | POA: Diagnosis not present

## 2016-11-10 DIAGNOSIS — I10 Essential (primary) hypertension: Secondary | ICD-10-CM

## 2016-11-10 DIAGNOSIS — E119 Type 2 diabetes mellitus without complications: Secondary | ICD-10-CM | POA: Diagnosis not present

## 2016-11-10 NOTE — Progress Notes (Signed)
Diabetes Self-Management Education  Visit Type: First/Initial  Appt. Start Time: 0900 Appt. End Time: 1000  11/10/2016  [name], identified by name and date of birth, is a 80 y.o. female with a diagnosis of Diabetes: Type 2.   ASSESSMENT       Diabetes Self-Management Education - 11/10/16 0904      Visit Information   Visit Type First/Initial     Initial Visit   Diabetes Type Type 2   Are you currently following a meal plan? No   Are you taking your medications as prescribed? Yes     Health Coping   How would you rate your overall health? Good     Psychosocial Assessment   Patient Belief/Attitude about Diabetes Motivated to manage diabetes   Self-care barriers None   Self-management support Doctor's office   Other persons present Patient   Patient Concerns Nutrition/Meal planning;Medication;Monitoring;Glycemic Control;Support   Special Needs None   Preferred Learning Style No preference indicated   Learning Readiness Ready     Pre-Education Assessment   Patient understands the diabetes disease and treatment process. Needs Instruction   Patient understands incorporating nutritional management into lifestyle. Needs Instruction   Patient undertands incorporating physical activity into lifestyle. Needs Instruction   Patient understands using medications safely. Needs Instruction   Patient understands monitoring blood glucose, interpreting and using results Needs Instruction   Patient understands prevention, detection, and treatment of acute complications. Needs Instruction   Patient understands prevention, detection, and treatment of chronic complications. Needs Instruction   Patient understands how to develop strategies to address psychosocial issues. Needs Instruction   Patient understands how to develop strategies to promote health/change behavior. Needs Instruction     Complications   Last HgB A1C per patient/outside source 7.8 %   How often do you check  your blood sugar? 0 times/day (not testing)   Number of hypoglycemic episodes per month 0   Have you had a dilated eye exam in the past 12 months? No   Have you had a dental exam in the past 12 months? No   Are you checking your feet? No     Dietary Intake   Breakfast bacon and eggs, toast, coffee, OJ   Lunch chick fil a sandwich, lemonade   Dinner green beans, rice, ribs     Exercise   Exercise Type ADL's     Patient Education   Previous Diabetes Education No   Disease state  Definition of diabetes, type 1 and 2, and the diagnosis of diabetes;Factors that contribute to the development of diabetes;Explored patient's options for treatment of their diabetes   Nutrition management  Role of diet in the treatment of diabetes and the relationship between the three main macronutrients and blood glucose level;Reviewed blood glucose goals for pre and post meals and how to evaluate the patients' food intake on their blood glucose level.   Physical activity and exercise  Role of exercise on diabetes management, blood pressure control and cardiac health.   Medications Other (comment)  role of medication   Monitoring Purpose and frequency of SMBG.;Taught/discussed recording of test results and interpretation of SMBG.;Identified appropriate SMBG and/or A1C goals.;Daily foot exams;Yearly dilated eye exam   Acute complications Taught treatment of hypoglycemia - the 15 rule.;Covered sick day management with medication and food.   Chronic complications Relationship between chronic complications and blood glucose control;Assessed and discussed foot care and prevention of foot problems;Retinopathy and reason for yearly dilated eye exams;Reviewed with patient heart disease,  higher risk of, and prevention;Lipid levels, blood glucose control and heart disease;Dental care   Psychosocial adjustment Role of stress on diabetes     Individualized Goals (developed by patient)   Nutrition General guidelines for healthy  choices and portions discussed   Physical Activity Exercise 3-5 times per week   Monitoring  test my blood glucose as discussed   Reducing Risk examine blood glucose patterns     Post-Education Assessment   Patient understands the diabetes disease and treatment process. Needs Review   Patient understands incorporating nutritional management into lifestyle. Needs Review   Patient undertands incorporating physical activity into lifestyle. Needs Review   Patient understands using medications safely. Needs Review   Patient understands monitoring blood glucose, interpreting and using results Needs Review   Patient understands prevention, detection, and treatment of acute complications. Needs Review   Patient understands prevention, detection, and treatment of chronic complications. Needs Review   Patient understands how to develop strategies to address psychosocial issues. Needs Review   Patient understands how to develop strategies to promote health/change behavior. Needs Review     Outcomes   Expected Outcomes Demonstrated interest in learning. Expect positive outcomes   Future DMSE PRN   Program Status Completed      Individualized Plan for Diabetes Self-Management Training:   Learning Objective:  Patient will have a greater understanding of diabetes self-management. Patient education plan is to attend individual and/or group sessions per assessed needs and concerns.   Plan:   There are no Patient Instructions on file for this visit.  Expected Outcomes:  Demonstrated interest in learning. Expect positive outcomes  Education material provided: Living Well with Diabetes and Meal plan card  If problems or questions, patient to contact team via:  Phone  Future DSME appointment: PRN

## 2016-12-03 ENCOUNTER — Encounter: Payer: Self-pay | Admitting: Internal Medicine

## 2017-07-12 ENCOUNTER — Ambulatory Visit (HOSPITAL_COMMUNITY)
Admission: EM | Admit: 2017-07-12 | Discharge: 2017-07-12 | Disposition: A | Discharge status: Home / Self Care | Payer: Medicare Other | Attending: Internal Medicine | Admitting: Internal Medicine

## 2017-07-12 ENCOUNTER — Encounter (HOSPITAL_COMMUNITY): Payer: Self-pay | Admitting: *Deleted

## 2017-07-12 DIAGNOSIS — B029 Zoster without complications: Secondary | ICD-10-CM | POA: Diagnosis not present

## 2017-07-12 MED ORDER — ACYCLOVIR 800 MG PO TABS: 800.0000 mg | ORAL_TABLET | Freq: Every day | ORAL | 0 refills | Status: DC

## 2017-07-12 NOTE — ED Triage Notes (Signed)
Pt  Has  A  Rash  On l  Upper  Chest   That is  Not  painfull        Perhaps  A  Slight itch      Pt  Is  In no  Acute   Distress

## 2017-07-12 NOTE — Discharge Instructions (Signed)
Take one tablet of acyclovir 5 times a day for 1 week. If you start having any pain, contact us as soon as possible. If your rash persists, follow up with your regular doctor in one to two weeks.

## 2017-07-12 NOTE — ED Provider Notes (Signed)
CSN: [id number]     Arrival date & time 07/12/17  1631 History   First MD Initiated Contact with Patient 07/12/17 1719     Chief Complaint  Patient presents with  . Rash   (Consider location/radiation/quality/duration/timing/severity/associated sxs/prior Treatment) The history is provided by the patient.  Rash  Location:  Shoulder/arm Shoulder/arm rash location:  L shoulder Quality: blistering, burning (slightly burning and tingling), itchiness and redness   Quality: not painful   Severity:  Mild Onset quality:  Gradual (woke up with the rash this morning) Duration:  1 day Timing:  Constant Progression:  Unchanged Chronicity:  New Context: not animal contact, not exposure to similar rash, not hot tub use, not insect bite/sting, not new detergent/soap, not sick contacts and not sun exposure   Relieved by:  None tried Worsened by:  Nothing Ineffective treatments:  None tried Associated symptoms: no fatigue, no fever, no myalgias, no nausea, no sore throat and no URI     Past Medical History:  Diagnosis Date  . Cystocele   . Hypertension   . Thyroid disease    hypothyroidism   Past Surgical History:  Procedure Laterality Date  . ABDOMINAL HYSTERECTOMY  1990   TAH BSO  . OOPHORECTOMY     BSO  . Vaginal Bx     Papilloma   Family History  Problem Relation Age of Onset  . Sickle cell anemia Other   . Hypertension Mother   . Cancer Father        LIVER  . Heart disease Brother   . Cancer Brother        STOMACH   Social History  Substance Use Topics  . Smoking status: Never Smoker  . Smokeless tobacco: Not on file  . Alcohol use No   OB History    Gravida Para Term Preterm AB Living   3 2 2   1 2    SAB TAB Ectopic Multiple Live Births   1             Review of Systems  Constitutional: Negative for fatigue and fever.  HENT: Negative for congestion, sinus pain, sinus pressure and sore throat.   Respiratory: Negative.   Cardiovascular: Negative.     Gastrointestinal: Negative for nausea.  Musculoskeletal: Negative for myalgias.  Skin: Positive for rash.  Neurological: Negative.     Allergies  Patient has no known allergies.  Home Medications   Prior to Admission medications   Medication Sig Start Date End Date Taking? Authorizing Provider  acyclovir (ZOVIRAX) 800 MG tablet Take 1 tablet (800 mg total) by mouth 5 (five) times daily. 07/12/17   Barnet Glasgow, NP  amLODipine (NORVASC) 10 MG tablet Take 10 mg by mouth daily.      [provider]  aspirin 81 MG tablet Take 81 mg by mouth daily.    [provider]  Cholecalciferol (VITAMIN D PO) Take 1 tablet by mouth daily.     [provider]  levothyroxine (SYNTHROID, LEVOTHROID) 75 MCG tablet Take 75 mcg by mouth daily.      [provider]  metFORMIN (GLUCOPHAGE) 500 MG tablet Take by mouth 2 (two) times daily with a meal.    [provider]  metoprolol succinate (TOPROL-XL) 50 MG 24 hr tablet Take 1 tablet by mouth daily. 12/13/14   [provider]  Multiple Vitamin (MULTIVITAMIN) tablet Take 1 tablet by mouth daily.      [provider]  omeprazole (PRILOSEC) 40 MG capsule Take  40 mg by mouth daily.    [provider]  spironolactone (ALDACTONE) 25 MG tablet Take 25 mg by mouth daily.    [provider]   Meds Ordered and Administered this Visit  Medications - No data to display  BP (!) 147/73 (BP Location: Left Arm) Comment: rn notified  Pulse 81   Temp 98.5 F (36.9 C) (Oral)   Resp 14   SpO2 98%  No data found.   Physical Exam  Constitutional: She is oriented to person, place, and time. She appears well-developed and well-nourished. No distress.  HENT:  Head: Normocephalic and atraumatic.  Right Ear: External ear normal.  Left Ear: External ear normal.  Eyes: Conjunctivae are normal.  Cardiovascular: Normal rate and regular rhythm.   Pulmonary/Chest: Effort normal and breath  sounds normal.  Neurological: She is alert and oriented to person, place, and time.  Skin: Skin is warm and dry. Capillary refill takes less than 2 seconds. Rash noted. Rash is vesicular. She is not diaphoretic.     3 distinct patches of vesicular, erythemic lesions each patch ranging 5-7 vesicular lesions.  Psychiatric: She has a normal mood and affect. Her behavior is normal.  Nursing note and vitals reviewed.   Urgent Care Course     Procedures (including critical care time)  Labs Review Labs Reviewed - No data to display  Imaging Review No results found.     MDM   1. Herpes zoster without complication    Starting on acyclovir, follow-up with primary care in 1-2 weeks as needed if rash persists, otherwise return to clinic as needed.     Barnet Glasgow, NP 07/12/17 1736

## 2017-08-31 ENCOUNTER — Other Ambulatory Visit: Payer: Self-pay | Admitting: Gastroenterology

## 2017-08-31 DIAGNOSIS — R1033 Periumbilical pain: Secondary | ICD-10-CM

## 2017-08-31 DIAGNOSIS — R1030 Lower abdominal pain, unspecified: Secondary | ICD-10-CM

## 2017-09-02 ENCOUNTER — Ambulatory Visit
Admission: RE | Admit: 2017-09-02 | Discharge: 2017-09-02 | Disposition: A | Discharge status: Home / Self Care | Payer: Medicare Other | Source: Ambulatory Visit | Attending: Gastroenterology | Admitting: Gastroenterology

## 2017-09-02 DIAGNOSIS — R1033 Periumbilical pain: Secondary | ICD-10-CM

## 2017-09-02 DIAGNOSIS — R1030 Lower abdominal pain, unspecified: Secondary | ICD-10-CM

## 2017-09-02 MED ORDER — IOPAMIDOL (ISOVUE-300) INJECTION 61%
100.0000 mL | Freq: Once | INTRAVENOUS | Status: AC | PRN
  Administered 2017-09-02: 100 mL via INTRAVENOUS

## 2018-01-16 ENCOUNTER — Emergency Department (HOSPITAL_COMMUNITY)
Admission: EM | Admit: 2018-01-16 | Discharge: 2018-01-16 | Disposition: A | Discharge status: Home / Self Care | Payer: Medicare Other | Attending: Emergency Medicine | Admitting: Emergency Medicine

## 2018-01-16 ENCOUNTER — Encounter (HOSPITAL_COMMUNITY): Payer: Self-pay | Admitting: Emergency Medicine

## 2018-01-16 DIAGNOSIS — I10 Essential (primary) hypertension: Secondary | ICD-10-CM | POA: Diagnosis not present

## 2018-01-16 DIAGNOSIS — Y999 Unspecified external cause status: Secondary | ICD-10-CM | POA: Insufficient documentation

## 2018-01-16 DIAGNOSIS — R51 Headache: Secondary | ICD-10-CM | POA: Diagnosis not present

## 2018-01-16 DIAGNOSIS — Y929 Unspecified place or not applicable: Secondary | ICD-10-CM | POA: Diagnosis not present

## 2018-01-16 DIAGNOSIS — Z7984 Long term (current) use of oral hypoglycemic drugs: Secondary | ICD-10-CM | POA: Diagnosis not present

## 2018-01-16 DIAGNOSIS — S0990XA Unspecified injury of head, initial encounter: Secondary | ICD-10-CM | POA: Diagnosis present

## 2018-01-16 DIAGNOSIS — Z7982 Long term (current) use of aspirin: Secondary | ICD-10-CM | POA: Insufficient documentation

## 2018-01-16 DIAGNOSIS — Z79899 Other long term (current) drug therapy: Secondary | ICD-10-CM | POA: Diagnosis not present

## 2018-01-16 DIAGNOSIS — Y9301 Activity, walking, marching and hiking: Secondary | ICD-10-CM | POA: Insufficient documentation

## 2018-01-16 DIAGNOSIS — W2209XA Striking against other stationary object, initial encounter: Secondary | ICD-10-CM | POA: Insufficient documentation

## 2018-01-16 NOTE — ED Provider Notes (Signed)
[facility] EMERGENCY DEPARTMENT Provider Note   CSN: [id number] Arrival date & time: 01/16/18  1615     History   Chief Complaint Chief Complaint  Patient presents with  . Head Injury    HPI Alejandra Davis is a 82 y.o. female.  HPI chief complaint is hit head  82 year old female was walking back from her neighbors house when she struck her head against the metal flat edge of a carport.  She states that she had to put her hands on her knees for a second but then was fine.  She walked to her house.  She has a mild frontal headache.  Is not anticoagulated.  Did not fall to ground.  No symptoms remain.  Past Medical History:  Diagnosis Date  . Cystocele   . Hypertension   . Thyroid disease    hypothyroidism    Patient Active Problem List   Diagnosis Date Noted  . Cystocele   . Osteopenia   . Thyroid disease   . Hypertension     Past Surgical History:  Procedure Laterality Date  . ABDOMINAL HYSTERECTOMY  1990   TAH BSO  . OOPHORECTOMY     BSO  . Vaginal Bx     Papilloma    OB History    Gravida Para Term Preterm AB Living   3 2 2   1 2    SAB TAB Ectopic Multiple Live Births   1               Home Medications    Prior to Admission medications   Medication Sig Start Date End Date Taking? Authorizing Provider  acyclovir (ZOVIRAX) 800 MG tablet Take 1 tablet (800 mg total) by mouth 5 (five) times daily. 07/12/17   Barnet Glasgow, NP  amLODipine (NORVASC) 10 MG tablet Take 10 mg by mouth daily.      [provider]  aspirin 81 MG tablet Take 81 mg by mouth daily.    [provider]  Cholecalciferol (VITAMIN D PO) Take 1 tablet by mouth daily.     [provider]  levothyroxine (SYNTHROID, LEVOTHROID) 75 MCG tablet Take 75 mcg by mouth daily.      [provider]  metFORMIN (GLUCOPHAGE) 500 MG tablet Take by mouth 2 (two) times daily with a meal.    [provider]  metoprolol succinate  (TOPROL-XL) 50 MG 24 hr tablet Take 1 tablet by mouth daily. 12/13/14   [provider]  Multiple Vitamin (MULTIVITAMIN) tablet Take 1 tablet by mouth daily.      [provider]  omeprazole (PRILOSEC) 40 MG capsule Take 40 mg by mouth daily.    [provider]  spironolactone (ALDACTONE) 25 MG tablet Take 25 mg by mouth daily.    [provider]    Family History Family History  Problem Relation Age of Onset  . Sickle cell anemia Other   . Hypertension Mother   . Cancer Father        LIVER  . Heart disease Brother   . Cancer Brother        STOMACH    Social History Social History   Tobacco Use  . Smoking status: Never Smoker  Substance Use Topics  . Alcohol use: No  . Drug use: No     Allergies   Patient has no known allergies.   Review of Systems Review of Systems  Constitutional: Negative for appetite change, chills, diaphoresis, fatigue and fever.  HENT: Negative for mouth sores, sore throat and trouble swallowing.   Eyes: Negative for visual disturbance.  Respiratory: Negative for cough, chest tightness, shortness of breath and wheezing.   Cardiovascular: Negative for chest pain.  Gastrointestinal: Negative for abdominal distention, abdominal pain, diarrhea, nausea and vomiting.  Endocrine: Negative for polydipsia, polyphagia and polyuria.  Genitourinary: Negative for dysuria, frequency and hematuria.  Musculoskeletal: Negative for gait problem.  Skin: Negative for color change, pallor and rash.  Neurological: Positive for headaches. Negative for dizziness, syncope and light-headedness.  Hematological: Does not bruise/bleed easily.  Psychiatric/Behavioral: Negative for behavioral problems and confusion.     Physical Exam Updated Vital Signs BP (!) 172/80   Pulse 64   Temp 98 F (36.7 C)   Resp 18   SpO2 100%   Physical Exam  Constitutional: She is oriented to person, place, and time. She appears well-developed and  well-nourished. No distress.  HENT:  Head: Normocephalic.  Points to her mid forehead as area of impact.  No erythema soft tissue swelling pain or tenderness.  No blood of the TMs, mastoids, or from ears nose or mouth.  Extraocular movements full and intact normal reactive pupils.  No proptosis or enophthalmos.  No septal hematoma nontender the remainder of the nasal bones and normal sensation to the face V1 to V3.  Eyes: Conjunctivae are normal. Pupils are equal, round, and reactive to light. No scleral icterus.  Neck: Normal range of motion. Neck supple. No thyromegaly present.  Cardiovascular: Normal rate and regular rhythm. Exam reveals no gallop and no friction rub.  No murmur heard. Pulmonary/Chest: Effort normal and breath sounds normal. No respiratory distress. She has no wheezes. She has no rales.  Abdominal: Soft. Bowel sounds are normal. She exhibits no distension. There is no tenderness. There is no rebound.  Musculoskeletal: Normal range of motion.  Neurological: She is alert and oriented to person, place, and time.  Skin: Skin is warm and dry. No rash noted.  Psychiatric: She has a normal mood and affect. Her behavior is normal.     ED Treatments / Results  Labs (all labs ordered are listed, but only abnormal results are displayed) Labs Reviewed - No data to display  EKG  EKG Interpretation None       Radiology No results found.  Procedures Procedures (including critical care time)  Medications Ordered in ED Medications - No data to display   Initial Impression / Assessment and Plan / ED Course  I have reviewed the triage vital signs and the nursing notes.  Pertinent labs & imaging results that were available during my care of the patient were reviewed by me and considered in my medical decision making (see chart for details).     Not anticoagulated.  No indications for imaging.  No symptoms.  Plan discharge home.  Expectant management.  Ice and Tylenol as  needed.  Final Clinical Impressions(s) / ED Diagnoses   Final diagnoses:  Injury of head, initial encounter    ED Discharge Orders    None       Tanna Furry, MD 01/16/18 [phone]

## 2018-01-16 NOTE — ED Triage Notes (Addendum)
Pt states she was walking in the car port when she hit her head on an "aluminum" bar. Pt did not lose consciousness, did not have visual changes, does not currently have a headache. Did not fall. Did drop to her knees. No knee pain. Ambulatory, no acute distress. Pt is not on blood thinners. Alert and oriented.

## 2018-01-16 NOTE — Discharge Instructions (Signed)
Tylenol and/or ice pack as needed for headache.

## 2018-01-27 NOTE — ED Notes (Signed)
Pt. Called for follow-up information, Questions answered, pt. Verbalized understanding and very grateful for the call.  01/27/2018, 17:41

## 2018-02-01 ENCOUNTER — Ambulatory Visit: Payer: Medicare Other | Admitting: Family Medicine

## 2018-02-07 ENCOUNTER — Ambulatory Visit (INDEPENDENT_AMBULATORY_CARE_PROVIDER_SITE_OTHER): Payer: Medicare Other | Admitting: Family Medicine

## 2018-02-07 ENCOUNTER — Encounter: Payer: Self-pay | Admitting: Family Medicine

## 2018-02-07 VITALS — BP 128/76 | HR 71 | Ht 63.0 in | Wt 130.0 lb

## 2018-02-07 DIAGNOSIS — E119 Type 2 diabetes mellitus without complications: Secondary | ICD-10-CM

## 2018-02-07 DIAGNOSIS — Z0000 Encounter for general adult medical examination without abnormal findings: Secondary | ICD-10-CM | POA: Diagnosis not present

## 2018-02-07 DIAGNOSIS — M8580 Other specified disorders of bone density and structure, unspecified site: Secondary | ICD-10-CM

## 2018-02-07 DIAGNOSIS — E079 Disorder of thyroid, unspecified: Secondary | ICD-10-CM

## 2018-02-07 DIAGNOSIS — I10 Essential (primary) hypertension: Secondary | ICD-10-CM

## 2018-02-07 NOTE — Progress Notes (Signed)
Subjective:  Patient ID: Alejandra Davis, female    DOB: [birth year]  Age: 82 y.o. MRN: [id number]  CC: Establish Care   HPI Alejandra Davis presents for establishment of care and follow-up of her diabetes hypertension and hypothyroidism.  She is accompanied by her's son and daughter.  Her son lives close by but her daughter lives in [place]  She currently lives with her 29 year old granddaughter who has sickle cell disease.  She is divorced and her ex lives in [place].  She is retired from [employer] where she worked in [employer].  She is living independently and is managing all of her activities of daily living.  This includes her checkbook and the upkeep of her home.  She is active in her church and volunteers there often.  Chart review shows that she saw her GYN provider last year.  She had a colonoscopy 8 years ago and was told that everything normal.  She is having no symptoms referrable to the gastrointestinal tract.  Last hemoglobin A1c on record was 7.82 years ago.  Chart review also shows that she has osteopenia.  She has not seen an eye doctor since she can remember.  She will scheduled to see a dentist soon.  She is nonfasting today.  She has not been exercising recently because she says I am just too lazy right now.  She does not smoke, drink alcohol or use illicit drugs. History Alejandra Davis has a past medical history of Chicken pox, Cystocele, Hypertension, and Thyroid disease.   She has a past surgical history that includes Abdominal hysterectomy (1990); Oophorectomy; and Vaginal Bx.   Her family history includes Cancer in her brother and father; Heart disease in her brother; Hypertension in her mother; Sickle cell anemia in her other.She reports that  has never smoked. she has never used smokeless tobacco. She reports that she does not drink alcohol or use drugs.  Outpatient Medications Prior to Visit  Medication Sig Dispense Refill  . amLODipine (NORVASC) 10 MG tablet Take 10 mg by  mouth daily.      Marland Kitchen aspirin 81 MG tablet Take 81 mg by mouth daily.    . Cholecalciferol (VITAMIN D PO) Take 1 tablet by mouth daily.     Marland Kitchen levothyroxine (SYNTHROID, LEVOTHROID) 75 MCG tablet Take 75 mcg by mouth daily.      . metFORMIN (GLUCOPHAGE) 500 MG tablet Take 500 mg by mouth daily with breakfast.     . metoprolol succinate (TOPROL-XL) 50 MG 24 hr tablet Take 1 tablet by mouth daily.  0  . spironolactone (ALDACTONE) 25 MG tablet Take 25 mg by mouth daily.    Marland Kitchen acyclovir (ZOVIRAX) 800 MG tablet Take 1 tablet (800 mg total) by mouth 5 (five) times daily. 35 tablet 0  . Multiple Vitamin (MULTIVITAMIN) tablet Take 1 tablet by mouth daily.      Marland Kitchen omeprazole (PRILOSEC) 40 MG capsule Take 40 mg by mouth daily.     No facility-administered medications prior to visit.     ROS Review of Systems  Constitutional: Negative.   HENT: Negative.   Eyes: Negative.   Respiratory: Negative.   Cardiovascular: Negative.   Gastrointestinal: Negative.   Endocrine: Negative for polyphagia and polyuria.  Genitourinary: Negative.   Skin: Negative.   Allergic/Immunologic: Negative for immunocompromised state.  Neurological: Negative.   Hematological: Negative.   Psychiatric/Behavioral: Negative.     Objective:  BP 128/76 (BP Location: Left Arm, Patient Position: Sitting, Cuff Size:  Normal)   Pulse 71   Ht 5\' 3"  (1.6 m)   Wt 130 lb (59 kg)   SpO2 98%   BMI 23.03 kg/m   Physical Exam  Constitutional: She is oriented to person, place, and time. She appears well-developed and well-nourished. No distress.  HENT:  Head: Normocephalic and atraumatic.  Right Ear: External ear normal.  Left Ear: External ear normal.  Mouth/Throat: Oropharynx is clear and moist. No oropharyngeal exudate.  Eyes: Conjunctivae and EOM are normal. Pupils are equal, round, and reactive to light. Right eye exhibits no discharge. Left eye exhibits no discharge. No scleral icterus.  Neck: Neck supple. No JVD present. No  tracheal deviation present. No thyromegaly present.  Cardiovascular: Normal rate, regular rhythm and normal heart sounds.  Pulmonary/Chest: Effort normal and breath sounds normal. No stridor.  Abdominal: Soft. Bowel sounds are normal. She exhibits no distension and no mass. There is no tenderness. There is no rebound and no guarding.  Lymphadenopathy:    She has no cervical adenopathy.  Neurological: She is alert and oriented to person, place, and time.  Skin: Skin is warm and dry. She is not diaphoretic.  Psychiatric: She has a normal mood and affect. Her behavior is normal.      Assessment & Plan:   [name] was seen today for establish care.  Diagnoses and all orders for this visit:  Essential hypertension -     CBC; Future -     Comprehensive metabolic panel; Future -     Urinalysis, Routine w reflex microscopic; Future  Thyroid disease -     TSH; Future  Osteopenia, unspecified location -     VITAMIN D 25 Hydroxy (Vit-D Deficiency, Fractures); Future  Health care maintenance -     CBC; Future -     Comprehensive metabolic panel; Future -     Hepatitis C antibody; Future -     HIV antibody; Future -     Lipid panel; Future -     Ambulatory referral to Ophthalmology  Controlled type 2 diabetes mellitus without complication, without long-term current use of insulin (HCC) -     CBC; Future -     Comprehensive metabolic panel; Future -     Urinalysis, Routine w reflex microscopic; Future -     Microalbumin / creatinine urine ratio; Future -     Hemoglobin A1c; Future -     Ambulatory referral to Ophthalmology   I have discontinued Alejandra Davis. Alejandra Davis's multivitamin, omeprazole, and acyclovir. I am also having her maintain her amLODipine, levothyroxine, spironolactone, Cholecalciferol (VITAMIN D PO), aspirin, metoprolol succinate, and metFORMIN.  No orders of the defined types were placed in this encounter.  This patient go to the pharmacy get herself shingles vaccine.  She  will return fasting for above ordered blood work.  The four of [place] agreed to reconvene shortly after that blood work.    Follow-up: No Follow-up on file.  Libby Maw, MD

## 2018-02-08 ENCOUNTER — Ambulatory Visit: Payer: Medicare Other | Admitting: Family Medicine

## 2018-02-08 ENCOUNTER — Ambulatory Visit (INDEPENDENT_AMBULATORY_CARE_PROVIDER_SITE_OTHER): Payer: Medicare Other | Admitting: Family Medicine

## 2018-02-08 ENCOUNTER — Encounter: Payer: Self-pay | Admitting: Family Medicine

## 2018-02-08 VITALS — BP 128/78 | HR 70 | Temp 97.8°F | Ht 63.0 in | Wt 130.0 lb

## 2018-02-08 DIAGNOSIS — T50Z95A Adverse effect of other vaccines and biological substances, initial encounter: Secondary | ICD-10-CM | POA: Diagnosis not present

## 2018-02-08 DIAGNOSIS — L858 Other specified epidermal thickening: Secondary | ICD-10-CM | POA: Insufficient documentation

## 2018-02-08 NOTE — Progress Notes (Signed)
Subjective:  Patient ID: Alejandra Davis, female    DOB: 09-15-1936  Age: 82 y.o. MRN: [id number]  CC: back itching   HPI Alejandra Davis presents for evaluation of a possible rash and itching in her upper back status post receiving her first shingles vaccine yesterday.  Patient is here with her son who tells me that his sister has been concerned about a rash on her upper back that was there before the vaccine.  She has no history of reactions to vaccines.  She has never had hives.  She denies swelling in her throat or shortness of breath.  History Alejandra Davis has a past medical history of Chicken pox, Cystocele, Hypertension, and Thyroid disease.   She has a past surgical history that includes Abdominal hysterectomy (1990); Oophorectomy; and Vaginal Bx.   Her family history includes Cancer in her brother and father; Heart disease in her brother; Hypertension in her mother; Sickle cell anemia in her other.She reports that  has never smoked. she has never used smokeless tobacco. She reports that she does not drink alcohol or use drugs.  Outpatient Medications Prior to Visit  Medication Sig Dispense Refill  . amLODipine (NORVASC) 10 MG tablet Take 10 mg by mouth daily.      Marland Kitchen aspirin 81 MG tablet Take 81 mg by mouth daily.    . Cholecalciferol (VITAMIN D PO) Take 1 tablet by mouth daily.     Marland Kitchen levothyroxine (SYNTHROID, LEVOTHROID) 75 MCG tablet Take 75 mcg by mouth daily.      . metFORMIN (GLUCOPHAGE) 500 MG tablet Take 500 mg by mouth daily with breakfast.     . metoprolol succinate (TOPROL-XL) 50 MG 24 hr tablet Take 1 tablet by mouth daily.  0  . spironolactone (ALDACTONE) 25 MG tablet Take 25 mg by mouth daily.     No facility-administered medications prior to visit.     ROS Review of Systems  Constitutional: Negative.   HENT: Negative for congestion, trouble swallowing and voice change.   Respiratory: Negative for chest tightness, shortness of breath and wheezing.   Cardiovascular:  Negative.   Gastrointestinal: Negative.   Skin: Positive for rash. Negative for color change and wound.  Neurological: Negative for headaches.  Psychiatric/Behavioral: Negative.     Objective:  BP 128/78 (BP Location: Right Arm, Patient Position: Sitting, Cuff Size: Normal)   Pulse 70   Temp 97.8 F (36.6 C) (Oral)   Ht 5\' 3"  (1.6 m)   Wt 130 lb (59 kg)   SpO2 100%   BMI 23.03 kg/m   Physical Exam  Constitutional: She is oriented to person, place, and time. She appears well-developed and well-nourished. No distress.  HENT:  Head: Normocephalic and atraumatic.  Right Ear: External ear normal.  Left Ear: External ear normal.  Mouth/Throat: Oropharynx is clear and moist. No oropharyngeal exudate.  Eyes: Conjunctivae are normal. Pupils are equal, round, and reactive to light. Right eye exhibits no discharge. Left eye exhibits no discharge. No scleral icterus.  Neck: Neck supple. No JVD present. No tracheal deviation present. No thyromegaly present.  Cardiovascular: Normal rate, regular rhythm and normal heart sounds.  Pulmonary/Chest: Effort normal and breath sounds normal. No stridor.  Abdominal: Bowel sounds are normal.  Lymphadenopathy:    She has no cervical adenopathy.  Neurological: She is alert and oriented to person, place, and time.  Skin: Skin is warm and dry. No rash noted. She is not diaphoretic. No erythema. No pallor.  Upper back skin with  xerosis. Somewhat pronounced dermal papillae. Occasional waxy stuck on lesions.   Psychiatric: She has a normal mood and affect. Her behavior is normal.      Assessment & Plan:   Zaiah was seen today for back itching.  Diagnoses and all orders for this visit:  Adverse effect of vaccine, initial encounter  Keratosis pilaris   I am having Alejandra Davis. Camp maintain her amLODipine, levothyroxine, spironolactone, Cholecalciferol (VITAMIN D PO), aspirin, metoprolol succinate, and metFORMIN.  No orders of the defined types were  placed in this encounter.  I do not believe that patient is having a reaction to the vaccine.  Keratosis pilaris would be a consideration.  She will return for fasting blood work and to see me in 2 weeks.  Follow-up: Return if symptoms worsen or fail to improve.  Libby Maw, MD

## 2018-02-25 ENCOUNTER — Other Ambulatory Visit (INDEPENDENT_AMBULATORY_CARE_PROVIDER_SITE_OTHER): Payer: Medicare Other

## 2018-02-25 ENCOUNTER — Encounter: Payer: Self-pay | Admitting: Family Medicine

## 2018-02-25 ENCOUNTER — Other Ambulatory Visit: Payer: Medicare Other

## 2018-02-25 ENCOUNTER — Ambulatory Visit (INDEPENDENT_AMBULATORY_CARE_PROVIDER_SITE_OTHER): Payer: Medicare Other | Admitting: Family Medicine

## 2018-02-25 VITALS — BP 110/80 | HR 59 | Wt 132.4 lb

## 2018-02-25 DIAGNOSIS — I10 Essential (primary) hypertension: Secondary | ICD-10-CM | POA: Diagnosis not present

## 2018-02-25 DIAGNOSIS — L853 Xerosis cutis: Secondary | ICD-10-CM | POA: Diagnosis not present

## 2018-02-25 DIAGNOSIS — E119 Type 2 diabetes mellitus without complications: Secondary | ICD-10-CM

## 2018-02-25 DIAGNOSIS — E079 Disorder of thyroid, unspecified: Secondary | ICD-10-CM

## 2018-02-25 DIAGNOSIS — M8580 Other specified disorders of bone density and structure, unspecified site: Secondary | ICD-10-CM

## 2018-02-25 DIAGNOSIS — Z0000 Encounter for general adult medical examination without abnormal findings: Secondary | ICD-10-CM | POA: Diagnosis not present

## 2018-02-25 LAB — URINALYSIS, ROUTINE W REFLEX MICROSCOPIC
BILIRUBIN URINE: NEGATIVE
Hgb urine dipstick: NEGATIVE
Ketones, ur: NEGATIVE
Leukocytes, UA: NEGATIVE
NITRITE: NEGATIVE
PH: 6 (ref 5.0–8.0)
RBC / HPF: NONE SEEN (ref 0–?)
Specific Gravity, Urine: 1.015 (ref 1.000–1.030)
Total Protein, Urine: NEGATIVE
Urine Glucose: NEGATIVE
Urobilinogen, UA: 0.2 (ref 0.0–1.0)

## 2018-02-25 LAB — COMPREHENSIVE METABOLIC PANEL
ALT: 12 U/L (ref 0–35)
AST: 17 U/L (ref 0–37)
Albumin: 3.9 g/dL (ref 3.5–5.2)
Alkaline Phosphatase: 50 U/L (ref 39–117)
BUN: 19 mg/dL (ref 6–23)
CHLORIDE: 105 meq/L (ref 96–112)
CO2: 28 meq/L (ref 19–32)
Calcium: 10.4 mg/dL (ref 8.4–10.5)
Creatinine, Ser: 1.16 mg/dL (ref 0.40–1.20)
GFR: 57.9 mL/min — AB (ref >60.00)
GLUCOSE: 120 mg/dL — AB (ref 70–99)
POTASSIUM: 4.8 meq/L (ref 3.5–5.1)
SODIUM: 141 meq/L (ref 135–145)
TOTAL PROTEIN: 7.9 g/dL (ref 6.0–8.3)
Total Bilirubin: 0.4 mg/dL (ref 0.2–1.2)

## 2018-02-25 LAB — VITAMIN D 25 HYDROXY (VIT D DEFICIENCY, FRACTURES): VITD: 48.68 ng/mL (ref 30.00–100.00)

## 2018-02-25 LAB — HEMOGLOBIN A1C: HEMOGLOBIN A1C: 6.5 % (ref 4.6–6.5)

## 2018-02-25 LAB — LIPID PANEL
CHOL/HDL RATIO: 3
Cholesterol: 197 mg/dL (ref 0–200)
HDL: 62.8 mg/dL (ref >39.00)
LDL CALC: 118 mg/dL — AB (ref 0–99)
NONHDL: 134.66
Triglycerides: 82 mg/dL (ref 0.0–149.0)
VLDL: 16.4 mg/dL (ref 0.0–40.0)

## 2018-02-25 LAB — MICROALBUMIN / CREATININE URINE RATIO
CREATININE, U: 83.5 mg/dL
MICROALB/CREAT RATIO: 3.5 mg/g (ref 0.0–30.0)
Microalb, Ur: 2.9 mg/dL — ABNORMAL HIGH (ref 0.0–1.9)

## 2018-02-25 LAB — TSH: TSH: 1.84 u[IU]/mL (ref 0.35–4.50)

## 2018-02-25 MED ORDER — METFORMIN HCL ER 500 MG PO TB24: 500.0000 mg | ORAL_TABLET | Freq: Every day | ORAL | 1 refills | Status: DC

## 2018-02-25 MED ORDER — CALCIUM CARBONATE-VITAMIN D 500-200 MG-UNIT PO TABS: 1.0000 | ORAL_TABLET | Freq: Two times a day (BID) | ORAL | 1 refills | Status: DC

## 2018-02-25 NOTE — Progress Notes (Signed)
Subjective:  Patient ID: Alejandra Davis, female    DOB: [birth year]  Age: 82 y.o. MRN: [id number]  CC: Follow-up   HPI Alejandra Davis presents for follow-up of her hypertension, hypothyroidism diabetes osteopenia and dry skin.  These remain well controlled on given medicines.  Vitamin D levels are good but she is not taking any calcium.  We will switch her over to Os-Cal today.  She is exercising by walking.  She is accompanied today by her son and niece.  She continues to live with her granddaughter who has sickle cell and is studying to become a CMA.  She is taking her Glucophage in the morning with breakfast.  Outpatient Medications Prior to Visit  Medication Sig Dispense Refill  . amLODipine (NORVASC) 10 MG tablet Take 10 mg by mouth daily.      Marland Kitchen aspirin 81 MG tablet Take 81 mg by mouth daily.    Marland Kitchen levothyroxine (SYNTHROID, LEVOTHROID) 75 MCG tablet Take 75 mcg by mouth daily.      . metoprolol succinate (TOPROL-XL) 50 MG 24 hr tablet Take 1 tablet by mouth daily.  0  . spironolactone (ALDACTONE) 25 MG tablet Take 25 mg by mouth daily.    . Cholecalciferol (VITAMIN D PO) Take 1 tablet by mouth daily.     . metFORMIN (GLUCOPHAGE) 500 MG tablet Take 500 mg by mouth daily with breakfast.      No facility-administered medications prior to visit.     ROS Review of Systems  Constitutional: Negative.   HENT: Negative.   Eyes: Negative.   Respiratory: Negative.   Cardiovascular: Negative.   Gastrointestinal: Negative.   Endocrine: Negative for polyphagia and polyuria.  Genitourinary: Negative.   Skin: Negative for color change and rash.  Allergic/Immunologic: Negative for immunocompromised state.  Neurological: Negative for weakness and headaches.  Hematological: Does not bruise/bleed easily.  Psychiatric/Behavioral: Negative.     Objective:  BP 110/80 (BP Location: Right Arm, Patient Position: Sitting, Cuff Size: Normal)   Pulse (!) 59   Wt 132 lb 6.4 oz (60.1 kg)   BMI 23.45  kg/m   BP Readings from Last 3 Encounters:  02/25/18 110/80  02/08/18 128/78  02/07/18 128/76    Wt Readings from Last 3 Encounters:  02/25/18 132 lb 6.4 oz (60.1 kg)  02/08/18 130 lb (59 kg)  02/07/18 130 lb (59 kg)    Physical Exam  Constitutional: She is oriented to person, place, and time. She appears well-developed and well-nourished. No distress.  HENT:  Head: Normocephalic and atraumatic.  Right Ear: External ear normal.  Left Ear: External ear normal.  Eyes: Right eye exhibits no discharge. Left eye exhibits no discharge. No scleral icterus.  Neck: No JVD present. No tracheal deviation present.  Pulmonary/Chest: Effort normal. No stridor.  Neurological: She is alert and oriented to person, place, and time.  Skin: Skin is warm and dry. She is not diaphoretic.     Psychiatric: She has a normal mood and affect. Her behavior is normal.    Lab Results  Component Value Date   WBC 7.3 08/17/2014   HGB 14.6 08/17/2014   HCT 45.3 08/17/2014   PLT 166 09/19/2009   GLUCOSE 120 (H) 02/25/2018   CHOL 197 02/25/2018   TRIG 82.0 02/25/2018   HDL 62.80 02/25/2018   LDLCALC 118 (H) 02/25/2018   ALT 12 02/25/2018   AST 17 02/25/2018   NA 141 02/25/2018   K 4.8 02/25/2018   CL 105 02/25/2018  CREATININE 1.16 02/25/2018   BUN 19 02/25/2018   CO2 28 02/25/2018   TSH 1.84 02/25/2018   HGBA1C 6.5 02/25/2018   MICROALBUR 2.9 (H) 02/25/2018    No results found.  Assessment & Plan:   [name] was seen today for follow-up.  Diagnoses and all orders for this visit:  Essential hypertension  Thyroid disease  Osteopenia, unspecified location -     calcium-vitamin D (OSCAL 500/200 D-3) 500-200 MG-UNIT tablet; Take 1 tablet by mouth 2 (two) times daily.  Controlled type 2 diabetes mellitus without complication, without long-term current use of insulin (HCC) -     metFORMIN (GLUCOPHAGE-XR) 500 MG 24 hr tablet; Take 1 tablet (500 mg total) by mouth at bedtime.  Xerosis  cutis   I have discontinued Alejandra Davis. Alejandra Davis's Cholecalciferol (VITAMIN D PO) and metFORMIN. I am also having her start on calcium-vitamin D and metFORMIN. Additionally, I am having her maintain her amLODipine, levothyroxine, spironolactone, aspirin, and metoprolol succinate.  Meds ordered this encounter  Medications  . calcium-vitamin D (OSCAL 500/200 D-3) 500-200 MG-UNIT tablet    Sig: Take 1 tablet by mouth 2 (two) times daily.    Dispense:  180 tablet    Refill:  1  . metFORMIN (GLUCOPHAGE-XR) 500 MG 24 hr tablet    Sig: Take 1 tablet (500 mg total) by mouth at bedtime.    Dispense:  90 tablet    Refill:  1     Follow-up: Return in about 6 months (around 08/28/2018), or if symptoms worsen or fail to improve. Encouraged continued use of Dove.  Suggested that she purchase Lubriderm or Eucerin and apply to skin liberally.  She will take the Glucophage at night.  Have switched her to Os-Cal with vitamin D needed to be taken twice a day.  We discontinued her current dose of vitamin D.  Follow-up in 6 months or as needed. Libby Maw, MD

## 2018-02-25 NOTE — Patient Instructions (Addendum)
Diabetes Mellitus and Skin Care Diabetes (diabetes mellitus) can lead to health problems over time, including skin problems. People with diabetes have a higher risk for many types of skin complications. This is because having poorly controlled blood sugar (glucose) levels can:  Damage nerves and blood vessels. This can result in decreased feeling in your legs and feet, which means you may not notice minor skin injuries that could lead to serious problems.  Reduce blood flow (circulation), which makes wounds heal more slowly and increases your risk of infection.  Cause areas of skin to become thick or discolored.  What are some common skin conditions that affect people with diabetes? Diabetes often causes dry skin. It can also cause the skin on the feet to get thinner, break more easily, and heal more slowly. There are certain skin conditions that commonly affect people who have diabetes, such as:  Bacterial skin infections, such as styes, boils, infected hair follicles, and infections of the skin around the nails.  Fungal skin infections. These are most common in areas where skin rubs together, such as in the armpits or under the breasts.  Open sores, especially on the feet.  Tissue death (gangrene). This can happen on your feet if a serious infection does not heal properly. Gangrene can cause the need for a foot or leg to be surgically removed (amputated).  Diabetes can also cause the skin to change. You may develop:  Dark, velvety markings on the skin that usually appear on the face, neck, armpits, inner thighs, and groin (acanthosis nigricans). This typically affects people of African-American and American-Indian descent.  Red, raised, scar-like tissue that may itch, feel painful, or develop into a wound (necrobiosis lipoidica).  Blisters on feet, toes, hands, or fingers.  Thickened, wax-like areas of skin that usually occur on the hands, forehead, or toes (digital sclerosis).  Brown  or red ring-shaped or half-ring-shaped patches of skin on the ears or fingers (disseminated granuloma).  Pea-shaped yellow bumps that may be itchy and surrounded by a red ring (eruptive xanthomatosis). This usually affects the arms, feet, buttocks, and the top of the hands.  Round, discolored patches of tan skin that do not hurt or itch (diabetic dermopathy). These may look like age spots.  What do I need to know about itchy skin? It is common for people with diabetes to have itchy skin caused by dryness. Frequent high blood glucose levels can cause itchiness, and poor circulation and certain skin infections can make dry, itchy skin worse. If you have itchy skin that is red or covered in a rash, this could be a sign of an allergic reaction to a medicine. If you have a rash or if your skin is very itchy, contact your health care provider. You may need help to manage your diabetes better, or you may need treatment for an infection. How can I prevent skin breakdown? When you have diabetes and you get a badly infected ulcer or sore that does not heal, your skin can break down, especially if you have poor circulation or are on bed rest. To prevent skin breakdown:  Keep your skin clean and dry. Wash your skin often. Do not use hot water.  Do not use any products that contain nicotine or tobacco, such as cigarettes and e-cigarettes. Smoking affects the body's ability to heal. If you need help quitting, ask your health care provider.  Check your skin every day for cuts, bruises, redness, blisters, or sores, especially on your feet. Tell your  health care provider about any cuts, wounds, or sores you have, especially if they are healing slowly.  If you are on bed rest, try to change positions often.  What else do I need to know about taking care of my skin?   To relieve dry skin and itching: ? Limit baths and showers to 5-10 minutes. ? Bathe with lukewarm water instead of hot water. ? Use mild soap  and gentle skin cleansers. Do not use soap that is perfumed or harsh or dries your skin. ? Put on lotion as soon as you finish bathing.  Make sure that your health care provider performs a visual foot exam at every medical visit.  Schedule a foot exam with your health care provider once every year. This exam includes an inspection of the structure and skin of your feet.  If you get a skin injury, such as a cut, blister, or sore, check the area every day for signs of infection. Check for: ? More redness, swelling, or pain. ? More fluid or blood. ? Warmth. ? Pus or a bad smell. Contact a health care provider if:  You develop a cut or sore, especially on your feet.  You develop signs of infection after a skin injury.  Your blood glucose level is higher than 240 mg/dL (13.3 mmol/L) for 2 days in a row.  You have itchy skin that develops redness or a rash.  You have discolored areas of skin.  You have areas where your skin is changing, such as thickening or appearing shiny. This information is not intended to replace advice given to you by your health care provider. Make sure you discuss any questions you have with your health care provider. Document Released: 05/26/2016 Document Revised: 07/03/2016 Document Reviewed: 05/26/2016 Elsevier Interactive Patient Education  2018 [employer].  Type 2 Diabetes Mellitus, Self Care, Adult When you have type 2 diabetes (type 2 diabetes mellitus), you must keep your blood sugar (glucose) under control. You can do this with:  Nutrition.  Exercise.  Lifestyle changes.  Medicines or insulin, if needed.  Support from your doctors and others.  How do I manage my blood sugar?  Check your blood sugar level every day, as often as told.  Call your doctor if your blood sugar is above your goal numbers for 2 tests in a row.  Have your A1c (hemoglobin A1c) level checked at least two times a year. Have it checked more often if your doctor tells  you to. Your doctor will set treatment goals for you. Generally, you should have these blood sugar levels:  Before meals (preprandial): 80-130 mg/dL (4.4-7.2 mmol/L).  After meals (postprandial): lower than 180 mg/dL (10 mmol/L).  A1c level: less than 7%.  What do I need to know about high blood sugar? High blood sugar is called hyperglycemia. Know the signs of high blood sugar. Signs may include:  Feeling: ? Thirsty. ? Hungry. ? Very tired.  Needing to pee (urinate) more than usual.  Blurry vision.  What do I need to know about low blood sugar? Low blood sugar is called hypoglycemia. This is when blood sugar is at or below 70 mg/dL (3.9 mmol/L). Symptoms may include:  Feeling: ? Hungry. ? Worried or nervous (anxious). ? Sweaty and clammy. ? Confused. ? Dizzy. ? Sleepy. ? Sick to your stomach (nauseous).  Having: ? A fast heartbeat (palpitations). ? A headache. ? A change in your vision. ? Jerky movements that you cannot control (seizure). ? Nightmares. ?  Tingling or no feeling (numbness) around the mouth, lips, or tongue.  Having trouble with: ? Talking. ? Paying attention (concentrating). ? Moving (coordination). ? Sleeping.  Shaking.  Passing out (fainting).  Getting upset easily (irritability).  Treating low blood sugar  To treat low blood sugar, eat or drink something sugary right away. If you can think clearly and swallow safely, follow the 15:15 rule:  Take 15 grams of a fast-acting carb (carbohydrate). Some fast-acting carbs are: ? 1 tube of glucose gel. ? 3 sugar tablets (glucose pills). ? 6-8 pieces of hard candy. ? 4 oz (120 mL) of fruit juice. ? 4 oz (120 mL) regular (not diet) soda.  Check your blood sugar 15 minutes after you take the carb.  If your blood sugar is still at or below 70 mg/dL (3.9 mmol/L), take 15 grams of a carb again.  If your blood sugar does not go above 70 mg/dL (3.9 mmol/L) after 3 tries, get help right  away.  After your blood sugar goes back to normal, eat a meal or a snack within 1 hour.  Treating very low blood sugar If your blood sugar is at or below 54 mg/dL (3 mmol/L), you have very low blood sugar (severe hypoglycemia). This is an emergency. Do not wait to see if the symptoms will go away. Get medical help right away. Call your local emergency services (911 in the [place] Do not drive yourself to the hospital. If you have very low blood sugar and you cannot eat or drink, you may need a glucagon shot (injection). A family member or friend should learn how to check your blood sugar and how to give you a glucagon shot. Ask your doctor if you need to have a glucagon shot kit at home. What else is important to manage my diabetes? Medicine Follow these instructions about insulin and diabetes medicines:  Take them as told by your doctor.  Adjust them as told by your doctor.  Do not run out of them.  Having diabetes can raise your risk for other long-term conditions. These include heart or kidney disease. Your doctor may prescribe medicines to help prevent problems from diabetes. Food   Make healthy food choices. These include: ? Chicken, fish, egg whites, and beans. ? Oats, whole wheat, bulgur, brown rice, quinoa, and millet. ? Fresh fruits and vegetables. ? Low-fat dairy products. ? Nuts, avocado, olive oil, and canola oil.  Make a food plan with a specialist (dietitian).  Follow instructions from your doctor about what you cannot eat or drink.  Drink enough fluid to keep your pee (urine) clear or pale yellow.  Eat healthy snacks between healthy meals.  Keep track of carbs that you eat. Read food labels. Learn food serving sizes.  Follow your sick day plan when you cannot eat or drink normally. Make this plan with your doctor so it is ready to use. Activity  Exercise at least 3 times a week.  Do not go more than 2 days without exercising.  Talk with your doctor before  you start a new exercise. Your doctor may need to adjust your insulin, medicines, or food. Lifestyle   Do not use any tobacco products. These include cigarettes, chewing tobacco, and e-cigarettes.If you need help quitting, ask your doctor.  Ask your doctor how much alcohol is safe for you.  Learn to deal with stress. If you need help with this, ask your doctor. Body care  Stay up to date with your shots (immunizations).  Have your eyes and feet checked by a doctor as often as told.  Check your skin and feet every day. Check for cuts, bruises, redness, blisters, or sores.  Brush your teeth and gums two times a day.  Floss at least one time a day.  Go to the dentist least one time every 6 months.  Stay at a healthy weight. General instructions   Take over-the-counter and prescription medicines only as told by your doctor.  Share your diabetes care plan with: ? Your work or school. ? People you live with.  Check your pee (urine) for ketones: ? When you are sick. ? As told by your doctor.  Carry a card or wear jewelry that says that you have diabetes.  Ask your doctor: ? Do I need to meet with a diabetes educator? ? Where can I find a support group for people with diabetes?  Keep all follow-up visits as told by your doctor. This is important. Where to find more information: To learn more about diabetes, visit:  American Diabetes Association: www.diabetes.org  American Association of Diabetes Educators: www.diabeteseducator.org/patient-resources  This information is not intended to replace advice given to you by your health care provider. Make sure you discuss any questions you have with your health care provider. Document Released: 04/06/2016 Document Revised: 05/21/2016 Document Reviewed: 01/17/2016 Elsevier Interactive Patient Education  [employer].

## 2018-02-26 LAB — HIV ANTIBODY (ROUTINE TESTING W REFLEX): HIV 1&2 Ab, 4th Generation: NONREACTIVE

## 2018-02-26 LAB — CBC
HCT: 41.8 % (ref 35.0–45.0)
HEMOGLOBIN: 13.4 g/dL (ref 11.7–15.5)
MCH: 26.4 pg — AB (ref 27.0–33.0)
MCHC: 32.1 g/dL (ref 32.0–36.0)
MCV: 82.4 fL (ref 80.0–100.0)
MPV: 10.4 fL (ref 7.5–12.5)
Platelets: 286 10*3/uL (ref 140–400)
RBC: 5.07 10*6/uL (ref 3.80–5.10)
RDW: 14.7 % (ref 11.0–15.0)
WBC: 6.1 10*3/uL (ref 3.8–10.8)

## 2018-02-26 LAB — HEPATITIS C ANTIBODY
HEP C AB: NONREACTIVE
SIGNAL TO CUT-OFF: 0.14 (ref <1.00)

## 2018-03-04 NOTE — Progress Notes (Signed)
[facility] Note  03/07/2018     CHIEF COMPLAINT Patient presents for Diabetic Eye Exam (Pt referred for DEE by [facility].  LEE x 3 yrs.  Current glasses x 4 yrs.  Vision constantly blurred.  Nothing makes it better.  Denies pain, flashes, floaters.  No hx of ocular disease or sx.  Pt states she is borderline diabetic, but does not check blood sugar.  Unsure of last HgA1C.)   HISTORY OF PRESENT ILLNESS: Alejandra Davis is a 82 y.o. female who presents to the clinic today for:   HPI    Diabetic Eye Exam    Vision is blurred for distance.  Associated Symptoms Glare.  Negative for Flashes, Distortion, Pain, Photophobia, Trauma, Jaw Claudication, Fever, Fatigue, Weight Loss, Shoulder/Hip pain, Scalp Tenderness, Redness, Blind Spot and Floaters.  Diabetes characteristics include controlled with diet and taking oral medications.  Blood sugar level is controlled.  I, the attending physician,  performed the HPI with the patient and updated documentation appropriately. Additional comments: Pt referred for DEE by [facility].  LEE x 3 yrs.  Current glasses x 4 yrs.  Vision constantly blurred.  Nothing makes it better.  Denies pain, flashes, floaters.  No hx of ocular disease or sx.  Pt states she is borderline diabetic, but does not check blood sugar.  Unsure of last HgA1C.          Comments    Pt referred for DEE by [facility].  LEE x 3 yrs.  Glasses x 4 yrs.  C/o constant blur ou x 2 wks.  Nothing makes vision better.  Denies pain, flashes, floaters, but c/o occassional glare OU.  Pt is borderling diabetic, and does not check blood sugar.  Unaware of last HgA1C.       Last edited by [name], MD on 03/07/2018 12:52 PM. (History)    Pt states she is not having any issues with OU VA; Pt states she is slightly diabetic; Pt reports she does not have a regular eye doctor; Pt denies any ocular sx, denies floaters, denies flashes;   Lab  Results  Component Value Date   HGBA1C 6.5 02/25/2018     Referring physician: Libby Maw, MD [place],  [id number]  HISTORICAL INFORMATION:   Selected notes from the MEDICAL RECORD NUMBER Referred by [name] for DM exam LEE-  Ocular Hx-  PMH- HTN, thyroid disease, DM (A1C- 6.5)    CURRENT MEDICATIONS: No current outpatient medications on file. (Ophthalmic Drugs)   No current facility-administered medications for this visit.  (Ophthalmic Drugs)   Current Outpatient Medications (Other)  Medication Sig  . amLODipine (NORVASC) 10 MG tablet Take 10 mg by mouth daily.    Marland Kitchen aspirin 81 MG tablet Take 81 mg by mouth daily.  . calcium-vitamin D (OSCAL 500/200 D-3) 500-200 MG-UNIT tablet Take 1 tablet by mouth 2 (two) times daily.  Marland Kitchen levothyroxine (SYNTHROID, LEVOTHROID) 75 MCG tablet Take 75 mcg by mouth daily.    . metFORMIN (GLUCOPHAGE-XR) 500 MG 24 hr tablet Take 1 tablet (500 mg total) by mouth at bedtime.  . metoprolol succinate (TOPROL-XL) 50 MG 24 hr tablet Take 1 tablet by mouth daily.  Marland Kitchen spironolactone (ALDACTONE) 25 MG tablet Take 25 mg by mouth daily.   No current facility-administered medications for this visit.  (Other)      REVIEW OF SYSTEMS: ROS    Positive for: Eyes  Last edited by Roselee Nova D on 03/07/2018 10:46 AM. (History)       ALLERGIES No Known Allergies  PAST MEDICAL HISTORY Past Medical History:  Diagnosis Date  . Chicken pox   . Cystocele   . Hypertension   . Thyroid disease    hypothyroidism   Past Surgical History:  Procedure Laterality Date  . ABDOMINAL HYSTERECTOMY  1990   TAH BSO  . OOPHORECTOMY     BSO  . Vaginal Bx     Papilloma    FAMILY HISTORY Family History  Problem Relation Age of Onset  . Sickle cell anemia Other   . Hypertension Mother   . Cancer Father        LIVER  . Heart disease Brother   . Cancer Brother        STOMACH    SOCIAL HISTORY Social History    Tobacco Use  . Smoking status: Never Smoker  . Smokeless tobacco: Never Used  Substance Use Topics  . Alcohol use: No  . Drug use: No         OPHTHALMIC EXAM:  Base Eye Exam    Visual Acuity (Snellen - Linear)      Right Left   Dist cc 20/30 -1 20/40 -2   Dist ph cc NI 20/30   Correction:  Glasses       Tonometry (Tonopen, 10:38 AM)      Right Left   Pressure 27 20       Pupils      Dark Light Shape React APD   Right 3 2 Round Brisk None   Left 3 2 Round Brisk None       Visual Fields (Counting fingers)      Left Right    Full Full       Extraocular Movement      Right Left    Full, Ortho Full, Ortho       Neuro/Psych    Oriented x3:  Yes   Mood/Affect:  Normal       Dilation    Both eyes:  1.0% Mydriacyl, 2.5% Phenylephrine @ 10:25 AM        Slit Lamp and Fundus Exam    Slit Lamp Exam      Right Left   Lids/Lashes Dermatochalasis - upper lid, Meibomian gland dysfunction Dermatochalasis - upper lid, Meibomian gland dysfunction   Conjunctiva/Sclera Melanosis Melanosis   Cornea Arcus, 1+ Punctate epithelial erosions Arcus, 1+ Punctate epithelial erosions   Anterior Chamber Moderate depth Moderate depth   Iris Round and dilated, No NVI Round and dilated, No NVI   Lens 2+ Nuclear sclerosis, 3-4+ Cortical cataract 3+ Nuclear sclerosis, 3-4+ Cortical cataract   Vitreous Vitreous syneresis Vitreous syneresis       Fundus Exam      Right Left   Disc Normal Normal   C/D Ratio 0.3 0.4   Macula Good foveal reflex, No heme or edema Good foveal reflex, No heme or edema   Vessels Mild Vascular attenuation Vascular attenuation   Periphery Attached, Temporal Lattice degeneration Attached, scattered Reticular degeneration        Refraction    Wearing Rx      Sphere Cylinder Axis Add   Right -0.75 +1.25 170 +2.50   Left -0.75 +1.00 003 +2.50   Age:  4 yrs   Type:  PAL       Manifest Refraction      Sphere Cylinder Axis Dist VA Add  Right -0.75  +0.25 030 20/30 +2.50   Left -1.00 +2.00 005 20/30 +2.50          IMAGING AND PROCEDURES  Imaging and Procedures for 03/07/18  OCT, Retina - OU - Both Eyes     Right Eye Quality was good. Central Foveal Thickness: 262. Progression has no prior data. Findings include abnormal foveal contour, no SRF, epiretinal membrane, intraretinal fluid.   Left Eye Quality was good. Central Foveal Thickness: 252. Progression has no prior data. Findings include abnormal foveal contour, epiretinal membrane, no IRF, no SRF.   Notes *Images captured and stored on drive  Diagnosis / Impression:  ERM OU OD: with abnormal contour and IRF OS: with mild cystic changes  Clinical management:  See below  Abbreviations: NFP - Normal foveal profile. CME - cystoid macular edema. PED - pigment epithelial detachment. IRF - intraretinal fluid. SRF - subretinal fluid. EZ - ellipsoid zone. ERM - epiretinal membrane. ORA - outer retinal atrophy. ORT - outer retinal tubulation. SRHM - subretinal hyper-reflective material                  ASSESSMENT/PLAN:    ICD-10-CM   1. Diabetes mellitus type 2 without retinopathy ([facility]) E11.9   2. Epiretinal membrane (ERM) of both eyes H35.373   3. Retinal edema H35.81 OCT, Retina - OU - Both Eyes  4. Combined forms of age-related cataract of both eyes H25.813     1. Diabetes mellitus, type 2 without retinopathy - The incidence, risk factors for progression, natural history and treatment options for diabetic retinopathy  were discussed with patient.   - The need for close monitoring of blood glucose, blood pressure, and serum lipids, avoiding cigarette or any type of tobacco, and the need for long term follow up was also discussed with patient. - f/u in 1 year, sooner prn  2,3. Epiretinal membrane, OU- The natural history, anatomy, potential for loss of vision, and treatment options including vitrectomy techniques and the complications of endophthalmitis, retinal  detachment, vitreous hemorrhage, cataract progression and permanent vision loss discussed with the patient. - relatively mild ERM OU -- blunting central foveal depression - OD with mild cystic changes; OS without cystic changes or edema - discussed findings and prognosis - recommend monitoring for now -- biggest issue is cataracts OU - F/u 4 months  3. Combined form age-related cataract OU-  - The symptoms of cataract, surgical options, and treatments and risks were discussed with patient. - discussed diagnosis and progression - visually significant -- reports significant glare symptoms and trouble driving at night - will refer to Dr. Eulas Post at [facility] for cataract evaluation   Ophthalmic Meds Ordered this visit:  No orders of the defined types were placed in this encounter.      Return in about 4 months (around 07/07/2018) for F/U ERM OU.  There are no Patient Instructions on file for this visit.   Explained the diagnoses, plan, and follow up with the patient and they expressed understanding.  Patient expressed understanding of the importance of proper follow up care.   This document serves as a record of services personally performed by Gardiner Sleeper, MD, PhD. It was created on their behalf by Catha Brow, [place], a certified ophthalmic assistant. The creation of this record is the provider's dictation and/or activities during the visit.  Electronically signed by: Catha Brow, [place]  03/07/18 12:53 PM   Gardiner Sleeper, M.D., Ph.D. Diseases & Surgery of the Retina and  Vitreous [facility] 03/07/18   I have reviewed the above documentation for accuracy and completeness, and I agree with the above. Gardiner Sleeper, M.D., Ph.D. 03/07/18 12:53 PM     Abbreviations: M myopia (nearsighted); A astigmatism; H hyperopia (farsighted); P presbyopia; Mrx spectacle prescription;  CTL contact lenses; OD right eye; OS left eye; OU both eyes  XT  exotropia; ET esotropia; PEK punctate epithelial keratitis; PEE punctate epithelial erosions; DES dry eye syndrome; MGD meibomian gland dysfunction; ATs artificial tears; PFAT's preservative free artificial tears; [place] nuclear sclerotic cataract; PSC posterior subcapsular cataract; ERM epi-retinal membrane; PVD posterior vitreous detachment; RD retinal detachment; DM diabetes mellitus; DR diabetic retinopathy; NPDR non-proliferative diabetic retinopathy; PDR proliferative diabetic retinopathy; CSME clinically significant macular edema; DME diabetic macular edema; dbh dot blot hemorrhages; CWS cotton wool spot; POAG primary open angle glaucoma; C/D cup-to-disc ratio; HVF humphrey visual field; GVF goldmann visual field; OCT optical coherence tomography; IOP intraocular pressure; BRVO Branch retinal vein occlusion; CRVO central retinal vein occlusion; CRAO central retinal artery occlusion; BRAO branch retinal artery occlusion; RT retinal tear; SB scleral buckle; PPV pars plana vitrectomy; VH Vitreous hemorrhage; PRP panretinal laser photocoagulation; IVK intravitreal kenalog; VMT vitreomacular traction; MH Macular hole;  NVD neovascularization of the disc; NVE neovascularization elsewhere; AREDS age related eye disease study; ARMD age related macular degeneration; POAG primary open angle glaucoma; EBMD epithelial/anterior basement membrane dystrophy; ACIOL anterior chamber intraocular lens; IOL intraocular lens; PCIOL posterior chamber intraocular lens; Phaco/IOL phacoemulsification with intraocular lens placement; [place] photorefractive keratectomy; LASIK laser assisted in situ keratomileusis; HTN hypertension; DM diabetes mellitus; COPD chronic obstructive pulmonary disease

## 2018-03-07 ENCOUNTER — Ambulatory Visit (INDEPENDENT_AMBULATORY_CARE_PROVIDER_SITE_OTHER): Payer: Medicare Other | Admitting: Ophthalmology

## 2018-03-07 ENCOUNTER — Encounter (INDEPENDENT_AMBULATORY_CARE_PROVIDER_SITE_OTHER): Payer: Self-pay | Admitting: Ophthalmology

## 2018-03-07 DIAGNOSIS — H25813 Combined forms of age-related cataract, bilateral: Secondary | ICD-10-CM | POA: Diagnosis not present

## 2018-03-07 DIAGNOSIS — H35373 Puckering of macula, bilateral: Secondary | ICD-10-CM | POA: Diagnosis not present

## 2018-03-07 DIAGNOSIS — H3581 Retinal edema: Secondary | ICD-10-CM

## 2018-03-07 DIAGNOSIS — E119 Type 2 diabetes mellitus without complications: Secondary | ICD-10-CM

## 2018-03-22 ENCOUNTER — Other Ambulatory Visit: Payer: Self-pay

## 2018-03-22 MED ORDER — METOPROLOL SUCCINATE ER 50 MG PO TB24: 50.0000 mg | ORAL_TABLET | Freq: Every day | ORAL | 1 refills | Status: DC

## 2018-03-31 ENCOUNTER — Ambulatory Visit (INDEPENDENT_AMBULATORY_CARE_PROVIDER_SITE_OTHER): Payer: Medicare Other | Admitting: Family Medicine

## 2018-03-31 ENCOUNTER — Encounter: Payer: Self-pay | Admitting: Family Medicine

## 2018-03-31 VITALS — BP 130/70 | HR 92 | Ht 63.0 in | Wt 130.2 lb

## 2018-03-31 DIAGNOSIS — L853 Xerosis cutis: Secondary | ICD-10-CM

## 2018-03-31 DIAGNOSIS — L309 Dermatitis, unspecified: Secondary | ICD-10-CM | POA: Insufficient documentation

## 2018-03-31 DIAGNOSIS — E079 Disorder of thyroid, unspecified: Secondary | ICD-10-CM | POA: Diagnosis not present

## 2018-03-31 DIAGNOSIS — E119 Type 2 diabetes mellitus without complications: Secondary | ICD-10-CM

## 2018-03-31 DIAGNOSIS — L659 Nonscarring hair loss, unspecified: Secondary | ICD-10-CM

## 2018-03-31 NOTE — Progress Notes (Signed)
Subjective:  Patient ID: Alejandra Davis, female    DOB: 1936-10-26  Age: 82 y.o. MRN: [id number]  CC: breaking out on arms/back (x a week)   HPI Alejandra Davis presents for evaluation of an itchy scalp with signet hair loss over the last several months.  She is compliant with her levothyroxine and TSH has been normal.  She is also compliant with her spironolactone.  She denies significant stressful events over the last year.  She denies a past medical history of eczema or asthma.  She continues to have the eruption of mostly asymptomatic scattered firm papules on her arms and legs.  These do not drain or become inflamed.  They do not seem to be migratory.  Outpatient Medications Prior to Visit  Medication Sig Dispense Refill  . amLODipine (NORVASC) 10 MG tablet Take 10 mg by mouth daily.      . calcium-vitamin D (OSCAL 500/200 D-3) 500-200 MG-UNIT tablet Take 1 tablet by mouth 2 (two) times daily. 180 tablet 1  . levothyroxine (SYNTHROID, LEVOTHROID) 75 MCG tablet Take 75 mcg by mouth daily.      . metFORMIN (GLUCOPHAGE-XR) 500 MG 24 hr tablet Take 1 tablet (500 mg total) by mouth at bedtime. 90 tablet 1  . metoprolol succinate (TOPROL-XL) 50 MG 24 hr tablet Take 1 tablet (50 mg total) by mouth daily. 90 tablet 1  . spironolactone (ALDACTONE) 25 MG tablet Take 25 mg by mouth daily.    Marland Kitchen aspirin 81 MG tablet Take 81 mg by mouth daily.     No facility-administered medications prior to visit.     ROS Review of Systems  Constitutional: Negative for chills, fatigue and fever.  HENT: Negative.   Eyes: Negative.   Respiratory: Negative.   Cardiovascular: Negative.   Gastrointestinal: Negative.   Endocrine: Negative for cold intolerance and heat intolerance.  Skin: Positive for rash. Negative for color change, pallor and wound.  Allergic/Immunologic: Negative for immunocompromised state.  Hematological: Does not bruise/bleed easily.  Psychiatric/Behavioral: Negative.     Objective:  BP  130/70 (BP Location: Right Arm, Patient Position: Sitting, Cuff Size: Normal)   Pulse 92   Ht 5\' 3"  (1.6 m)   Wt 130 lb 4 oz (59.1 kg)   SpO2 99%   BMI 23.07 kg/m   BP Readings from Last 3 Encounters:  03/31/18 130/70  02/25/18 110/80  02/08/18 128/78    Wt Readings from Last 3 Encounters:  03/31/18 130 lb 4 oz (59.1 kg)  02/25/18 132 lb 6.4 oz (60.1 kg)  02/08/18 130 lb (59 kg)    Physical Exam  Constitutional: She is oriented to person, place, and time. She appears well-developed and well-nourished. No distress.  HENT:  Head: Normocephalic and atraumatic.  Right Ear: External ear normal.  Left Ear: External ear normal.  Eyes: Pupils are equal, round, and reactive to light. Right eye exhibits no discharge. Left eye exhibits no discharge. No scleral icterus.  Neck: Neck supple. No JVD present. No tracheal deviation present. No thyromegaly present.  Pulmonary/Chest: Effort normal. No stridor.  Lymphadenopathy:    She has no cervical adenopathy.  Neurological: She is alert and oriented to person, place, and time.  Skin: She is not diaphoretic.  Scaling circular patch in anterior scalp.   Few scattered 2 -3 mm firm papules without umbilication on arms and legs.   Psychiatric: She has a normal mood and affect. Her behavior is normal.    Lab Results  Component Value Date  WBC 6.1 02/25/2018   HGB 13.4 02/25/2018   HCT 41.8 02/25/2018   PLT 286 02/25/2018   GLUCOSE 120 (H) 02/25/2018   CHOL 197 02/25/2018   TRIG 82.0 02/25/2018   HDL 62.80 02/25/2018   LDLCALC 118 (H) 02/25/2018   ALT 12 02/25/2018   AST 17 02/25/2018   NA 141 02/25/2018   K 4.8 02/25/2018   CL 105 02/25/2018   CREATININE 1.16 02/25/2018   BUN 19 02/25/2018   CO2 28 02/25/2018   TSH 1.84 02/25/2018   HGBA1C 6.5 02/25/2018   MICROALBUR 2.9 (H) 02/25/2018    No results found.  Assessment & Plan:   Alejandra Davis was seen today for breaking out on arms/back.  Diagnoses and all orders for this  visit:  Thyroid disease  Controlled type 2 diabetes mellitus without complication, without long-term current use of insulin ([place])  Xerosis cutis -     Ambulatory referral to Dermatology  Eczema of scalp -     Ambulatory referral to Dermatology -     RPR  Hair loss disorder -     Ambulatory referral to Dermatology -     RPR   I have discontinued Alejandra Eden. Davis's aspirin. I am also having her maintain her amLODipine, levothyroxine, spironolactone, calcium-vitamin D, metFORMIN, and metoprolol succinate.  No orders of the defined types were placed in this encounter.  Recommended continued therapy for her skin with moisturizers.  Also suggested the use of DHS shampoo or [place] Blue shampoo for scalp.  She can consider minoxidil for women.  Thyroid and diabetes remained under control.  She is compliant with her levothyroxine and spironolactone.  Follow-up: No follow-ups on file.  Alejandra Maw, MD

## 2018-04-01 ENCOUNTER — Encounter: Payer: Self-pay | Admitting: Family Medicine

## 2018-04-05 ENCOUNTER — Other Ambulatory Visit: Payer: Self-pay | Admitting: Family Medicine

## 2018-04-05 MED ORDER — LEVOTHYROXINE SODIUM 75 MCG PO TABS: 75.0000 ug | ORAL_TABLET | Freq: Every day | ORAL | 1 refills | Status: DC

## 2018-04-05 NOTE — Telephone Encounter (Signed)
Copied from [place] [phone]. Topic: Quick Communication - Rx Refill/Question >> Apr 05, 2018 11:38 AM Lennox Solders wrote: Medication new rx levothyroxine .075 mcg.#90  [facility] [address] [phone]

## 2018-04-05 NOTE — Telephone Encounter (Signed)
Rx has been sent in. 

## 2018-05-04 ENCOUNTER — Telehealth: Payer: Self-pay | Admitting: Family Medicine

## 2018-05-04 NOTE — Telephone Encounter (Signed)
Copied from [place] [phone]. Topic: Quick Communication - See Telephone Encounter >> May 04, 2018  4:53 PM Bea Graff, NT wrote: CRM for notification. See Telephone encounter for: 05/04/18. Pts daughter calling and would like to speak with Dr. Ethelene Hal regarding her moms blood work. She would like the results and the explanation for her moms short term memory loss. And does her mom need to have her thyroid rechecked and if so what are the steps for that? Also, pt went to the dermatologist that was referred by Dr. Ethelene Hal and that dr wrote an rx for iron and they don't know why. Is her mom anemic?

## 2018-05-05 NOTE — Telephone Encounter (Signed)
Noted, thank you

## 2018-05-05 NOTE — Telephone Encounter (Signed)
° °  Daughter scheduled an appt for 06/07/18

## 2018-05-05 NOTE — Telephone Encounter (Signed)
I left a voicemail for patient's daughter to schedule an appointment at a time & date that would work for both her & the patient.

## 2018-05-05 NOTE — Telephone Encounter (Signed)
Have patient rtc with concerned family member.

## 2018-05-31 ENCOUNTER — Ambulatory Visit: Payer: Medicare Other | Admitting: Family Medicine

## 2018-05-31 ENCOUNTER — Encounter: Payer: Self-pay | Admitting: Family Medicine

## 2018-05-31 VITALS — BP 122/80 | HR 70 | Ht 63.0 in | Wt 130.0 lb

## 2018-05-31 DIAGNOSIS — L309 Dermatitis, unspecified: Secondary | ICD-10-CM

## 2018-05-31 DIAGNOSIS — L853 Xerosis cutis: Secondary | ICD-10-CM

## 2018-05-31 NOTE — Progress Notes (Signed)
Subjective:  Patient ID: Alejandra Davis, female    DOB: 1936-04-22  Age: 82 y.o. MRN: [id number]  CC: Rash (received a shot last week, started to break out with rash )   HPI Alejandra Davis presents for follow-up of her dry skin and itchy scalp.  She was able to see dermatology and they prescribed 2% Nizoral shampoo to be used daily for 2 weeks.  She then is to apply fluorinated lotion to her scalp after showering.  She has been instructed to use a skin moisturizer promptly directly after finishing her shower.  She is accompanied by her son.  Her granddaughter continues to live with her.  I asked her about her vision.  Her son said that she does have cataracts and extraction has been recommended.  Outpatient Medications Prior to Visit  Medication Sig Dispense Refill  . amLODipine (NORVASC) 10 MG tablet Take 10 mg by mouth daily.      . calcium-vitamin D (OSCAL 500/200 D-3) 500-200 MG-UNIT tablet Take 1 tablet by mouth 2 (two) times daily. 180 tablet 1  . levothyroxine (SYNTHROID, LEVOTHROID) 75 MCG tablet Take 1 tablet (75 mcg total) by mouth daily. 90 tablet 1  . metFORMIN (GLUCOPHAGE-XR) 500 MG 24 hr tablet Take 1 tablet (500 mg total) by mouth at bedtime. 90 tablet 1  . metoprolol succinate (TOPROL-XL) 50 MG 24 hr tablet Take 1 tablet (50 mg total) by mouth daily. 90 tablet 1  . spironolactone (ALDACTONE) 25 MG tablet Take 25 mg by mouth daily.     No facility-administered medications prior to visit.     ROS Review of Systems  Constitutional: Negative.   Eyes: Positive for visual disturbance.  Respiratory: Negative.   Cardiovascular: Negative.   Gastrointestinal: Negative.   Endocrine: Negative for cold intolerance.  Genitourinary: Negative.   Skin: Positive for rash. Negative for color change, pallor and wound.  Allergic/Immunologic: Negative for immunocompromised state.  Neurological: Negative for weakness and headaches.  Hematological: Does not bruise/bleed easily.    Psychiatric/Behavioral: Negative.     Objective:  BP 122/80   Pulse 70   Ht 5\' 3"  (1.6 m)   Wt 130 lb (59 kg)   SpO2 99%   BMI 23.03 kg/m   BP Readings from Last 3 Encounters:  05/31/18 122/80  03/31/18 130/70  02/25/18 110/80    Wt Readings from Last 3 Encounters:  05/31/18 130 lb (59 kg)  03/31/18 130 lb 4 oz (59.1 kg)  02/25/18 132 lb 6.4 oz (60.1 kg)    Physical Exam  Lab Results  Component Value Date   WBC 6.1 02/25/2018   HGB 13.4 02/25/2018   HCT 41.8 02/25/2018   PLT 286 02/25/2018   GLUCOSE 120 (H) 02/25/2018   CHOL 197 02/25/2018   TRIG 82.0 02/25/2018   HDL 62.80 02/25/2018   LDLCALC 118 (H) 02/25/2018   ALT 12 02/25/2018   AST 17 02/25/2018   NA 141 02/25/2018   K 4.8 02/25/2018   CL 105 02/25/2018   CREATININE 1.16 02/25/2018   BUN 19 02/25/2018   CO2 28 02/25/2018   TSH 1.84 02/25/2018   HGBA1C 6.5 02/25/2018   MICROALBUR 2.9 (H) 02/25/2018    No results found.  Assessment & Plan:   [name] was seen today for rash.  Diagnoses and all orders for this visit:  Xerosis cutis  Eczema of scalp   I am having Alejandra Davis. [name] maintain her amLODipine, spironolactone, calcium-vitamin D, metFORMIN, metoprolol succinate, and levothyroxine.  No orders of the defined types were placed in this encounter.  Stressed the importance of applying moisturizer directly after showering.  Also encouraged her to use gentle soap such as dove or [place].  Continue using Nizoral shampoo and fluorinated lotion on her scalp as directed. Encouraged fu with opthalmology.  Follow-up: Return if symptoms worsen or fail to improve.  Libby Maw, MD

## 2018-06-07 ENCOUNTER — Ambulatory Visit: Payer: Medicare Other | Admitting: Family Medicine

## 2018-06-07 ENCOUNTER — Encounter: Payer: Self-pay | Admitting: Family Medicine

## 2018-06-07 VITALS — BP 126/80 | HR 76 | Ht 63.0 in | Wt 130.0 lb

## 2018-06-07 DIAGNOSIS — R413 Other amnesia: Secondary | ICD-10-CM

## 2018-06-07 DIAGNOSIS — L650 Telogen effluvium: Secondary | ICD-10-CM

## 2018-06-07 DIAGNOSIS — L309 Dermatitis, unspecified: Secondary | ICD-10-CM

## 2018-06-07 DIAGNOSIS — L858 Other specified epidermal thickening: Secondary | ICD-10-CM | POA: Diagnosis not present

## 2018-06-07 DIAGNOSIS — L853 Xerosis cutis: Secondary | ICD-10-CM | POA: Diagnosis not present

## 2018-06-07 NOTE — Progress Notes (Signed)
Subjective:  Patient ID: Alejandra Davis, female    DOB: August 16, 1936  Age: 82 y.o. MRN: [id number]  CC: No chief complaint on file.   HPI CHIYO FAY presents for follow-up of her xerosis cutis and eczema of scalp status post visit with dermatology.  She is here with her daughter who does not feel that these conditions are improving with the treatment that she had received.  This daughter is also concerned about her mother short-term memory loss that she feels is worsening.  Outpatient Medications Prior to Visit  Medication Sig Dispense Refill  . amLODipine (NORVASC) 10 MG tablet Take 10 mg by mouth daily.      . calcium-vitamin D (OSCAL 500/200 D-3) 500-200 MG-UNIT tablet Take 1 tablet by mouth 2 (two) times daily. 180 tablet 1  . levothyroxine (SYNTHROID, LEVOTHROID) 75 MCG tablet Take 1 tablet (75 mcg total) by mouth daily. 90 tablet 1  . metFORMIN (GLUCOPHAGE-XR) 500 MG 24 hr tablet Take 1 tablet (500 mg total) by mouth at bedtime. 90 tablet 1  . metoprolol succinate (TOPROL-XL) 50 MG 24 hr tablet Take 1 tablet (50 mg total) by mouth daily. 90 tablet 1  . spironolactone (ALDACTONE) 25 MG tablet Take 25 mg by mouth daily.     No facility-administered medications prior to visit.     ROS Review of Systems  Constitutional: Negative for chills, fatigue, fever and unexpected weight change.  HENT: Negative.   Respiratory: Negative.   Cardiovascular: Negative.   Gastrointestinal: Negative.   Endocrine: Negative for cold intolerance, heat intolerance, polyphagia and polyuria.  Skin: Positive for color change and rash. Negative for pallor.  Neurological: Negative for weakness and headaches.  Hematological: Does not bruise/bleed easily.  Psychiatric/Behavioral: Negative for agitation and behavioral problems.    Objective:  BP 126/80   Pulse 76   Ht 5\' 3"  (1.6 m)   Wt 130 lb (59 kg)   SpO2 99%   BMI 23.03 kg/m   BP Readings from Last 3 Encounters:  06/07/18 126/80  05/31/18  122/80  03/31/18 130/70    Wt Readings from Last 3 Encounters:  06/07/18 130 lb (59 kg)  05/31/18 130 lb (59 kg)  03/31/18 130 lb 4 oz (59.1 kg)    Physical Exam  Constitutional: She appears well-developed and well-nourished. No distress.  HENT:  Head: Normocephalic and atraumatic.  Right Ear: External ear normal.  Left Ear: External ear normal.  Eyes: Right eye exhibits no discharge. Left eye exhibits no discharge. No scleral icterus.  Neck: No JVD present. No tracheal deviation present.  Pulmonary/Chest: Effort normal.  Skin: Rash noted. She is not diaphoretic. No erythema. No pallor.  Psychiatric: She has a normal mood and affect. Her behavior is normal.    Lab Results  Component Value Date   WBC 6.1 02/25/2018   HGB 13.4 02/25/2018   HCT 41.8 02/25/2018   PLT 286 02/25/2018   GLUCOSE 120 (H) 02/25/2018   CHOL 197 02/25/2018   TRIG 82.0 02/25/2018   HDL 62.80 02/25/2018   LDLCALC 118 (H) 02/25/2018   ALT 12 02/25/2018   AST 17 02/25/2018   NA 141 02/25/2018   K 4.8 02/25/2018   CL 105 02/25/2018   CREATININE 1.16 02/25/2018   BUN 19 02/25/2018   CO2 28 02/25/2018   TSH 1.84 02/25/2018   HGBA1C 6.5 02/25/2018   MICROALBUR 2.9 (H) 02/25/2018    No results found.  Assessment & Plan:   Diagnoses and all orders for  this visit:  Eczema of scalp -     Ambulatory referral to Dermatology  Keratosis pilaris  Xerosis cutis -     Ambulatory referral to Dermatology  Telogen effluvium  Short-term memory loss -     Ambulatory referral to Neurology   I am having Hans Eden. Alvelo maintain her amLODipine, spironolactone, calcium-vitamin D, metFORMIN, metoprolol succinate, and levothyroxine.  No orders of the defined types were placed in this encounter.  Daughter requests referral to another dermatologist for second opinion.  I stressed the importance of using a gentle soap like Dove [place] and immediately applying moisturizer after drying after showering.  I  suggested that they  consider changing laundry detergents.  Discussed in importance of taking her thyroid medicine on a fasting stomach.  Her TSH has been in the normal range.  Do not believe that hypothyroidism is a cause of her dry skin or hair loss.  Discussed the nature of telogen effluvium that the hair loss can come up to 6 months after a stressful event.  An RPR is being drawn today. Follow-up: Return in about 3 months (around 09/07/2018).  Libby Maw, MD

## 2018-06-08 LAB — RPR: RPR Ser Ql: NONREACTIVE

## 2018-06-29 ENCOUNTER — Ambulatory Visit: Payer: Medicare Other | Admitting: Family Medicine

## 2018-06-29 ENCOUNTER — Encounter: Payer: Self-pay | Admitting: Family Medicine

## 2018-06-29 VITALS — BP 110/70 | HR 65 | Temp 98.2°F | Ht 63.0 in | Wt 132.0 lb

## 2018-06-29 DIAGNOSIS — L300 Nummular dermatitis: Secondary | ICD-10-CM

## 2018-06-29 MED ORDER — TRIAMCINOLONE ACETONIDE 0.1 % EX OINT: 1.0000 "application " | TOPICAL_OINTMENT | Freq: Two times a day (BID) | CUTANEOUS | 0 refills | Status: DC

## 2018-06-29 NOTE — Progress Notes (Signed)
Subjective:  Patient ID: Alejandra Davis, female    DOB: 08-22-1936  Age: 82 y.o. MRN: [id number]  CC: bumps on inner thighs   HPI Alejandra Davis presents for evaluation of bumps on her thighs that have erupted over the last week or so.  They are intermittently pruritic.  There is been no discharge streaking or pain.  Outpatient Medications Prior to Visit  Medication Sig Dispense Refill  . amLODipine (NORVASC) 10 MG tablet Take 10 mg by mouth daily.      . calcium-vitamin D (OSCAL 500/200 D-3) 500-200 MG-UNIT tablet Take 1 tablet by mouth 2 (two) times daily. 180 tablet 1  . levothyroxine (SYNTHROID, LEVOTHROID) 75 MCG tablet Take 1 tablet (75 mcg total) by mouth daily. 90 tablet 1  . metFORMIN (GLUCOPHAGE-XR) 500 MG 24 hr tablet Take 1 tablet (500 mg total) by mouth at bedtime. 90 tablet 1  . metoprolol succinate (TOPROL-XL) 50 MG 24 hr tablet Take 1 tablet (50 mg total) by mouth daily. 90 tablet 1  . spironolactone (ALDACTONE) 25 MG tablet Take 25 mg by mouth daily.     No facility-administered medications prior to visit.     ROS Review of Systems  Constitutional: Negative for chills, fatigue, fever and unexpected weight change.  HENT: Negative.   Eyes: Negative.   Respiratory: Negative.   Cardiovascular: Negative.   Gastrointestinal: Negative.   Genitourinary: Negative.   Skin: Positive for rash. Negative for color change and wound.  Hematological: Negative for adenopathy.    Objective:  BP 110/70   Pulse 65   Temp 98.2 F (36.8 C)   Ht 5\' 3"  (1.6 m)   Wt 132 lb (59.9 kg)   SpO2 97%   BMI 23.38 kg/m   BP Readings from Last 3 Encounters:  06/29/18 110/70  06/07/18 126/80  05/31/18 122/80    Wt Readings from Last 3 Encounters:  06/29/18 132 lb (59.9 kg)  06/07/18 130 lb (59 kg)  05/31/18 130 lb (59 kg)    Physical Exam  Constitutional: She is oriented to person, place, and time. She appears well-developed and well-nourished. No distress.  HENT:  Head:  Normocephalic and atraumatic.  Right Ear: External ear normal.  Left Ear: External ear normal.  Eyes: Right eye exhibits no discharge. Left eye exhibits no discharge. No scleral icterus.  Neck: No JVD present. No tracheal deviation present.  Pulmonary/Chest: Effort normal.  Neurological: She is alert and oriented to person, place, and time.  Skin: Skin is warm and dry. Rash noted. She is not diaphoretic.     Psychiatric: She has a normal mood and affect. Her behavior is normal.    Lab Results  Component Value Date   WBC 6.1 02/25/2018   HGB 13.4 02/25/2018   HCT 41.8 02/25/2018   PLT 286 02/25/2018   GLUCOSE 120 (H) 02/25/2018   CHOL 197 02/25/2018   TRIG 82.0 02/25/2018   HDL 62.80 02/25/2018   LDLCALC 118 (H) 02/25/2018   ALT 12 02/25/2018   AST 17 02/25/2018   NA 141 02/25/2018   K 4.8 02/25/2018   CL 105 02/25/2018   CREATININE 1.16 02/25/2018   BUN 19 02/25/2018   CO2 28 02/25/2018   TSH 1.84 02/25/2018   HGBA1C 6.5 02/25/2018   MICROALBUR 2.9 (H) 02/25/2018    No results found.  Assessment & Plan:   Shandel was seen today for bumps on inner thighs.  Diagnoses and all orders for this visit:  Nummular eczema -  Discontinue: triamcinolone ointment (KENALOG) 0.1 %; Apply 1 application topically 2 (two) times daily. -     triamcinolone ointment (KENALOG) 0.1 %; Apply 1 application topically 2 (two) times daily. For rash on thighs and arms.   I have changed Hans Eden. Bissonnette's triamcinolone ointment. I am also having her maintain her amLODipine, spironolactone, calcium-vitamin D, metFORMIN, metoprolol succinate, and levothyroxine.  Meds ordered this encounter  Medications  . DISCONTD: triamcinolone ointment (KENALOG) 0.1 %    Sig: Apply 1 application topically 2 (two) times daily.    Dispense:  30 g    Refill:  0  . triamcinolone ointment (KENALOG) 0.1 %    Sig: Apply 1 application topically 2 (two) times daily. For rash on thighs and arms.    Dispense:  30 g     Refill:  0     Follow-up: Return in about 2 weeks (around 07/13/2018).  Libby Maw, MD

## 2018-06-29 NOTE — Patient Instructions (Signed)
Nummular Eczema Nummular eczema is a common disease that causes red, circular, crusted (plaque) lesions that may be itchy. It most commonly affects the lower legs and the backs of hands. Men tend to get their first outbreak between 7 and 80 years of age, and women tend to get their first outbreak during their teen or young adult years. What are the causes? The cause of this condition is not known. It may be related to certain skin sensitivities, such as sensitivity to:  Metals such as nickel and, rarely, mercury.  Formaldehyde.  Antibiotic medicine that is applied to the skin, such as neomycin.  What increases the risk? You are more likely to develop this condition if:  You have very dry skin.  You live in a place with dry and cold weather.  You have a personal or family history of eczema, asthma, or allergies.  You drink alcohol.  You have poor blood flow (circulation).  What are the signs or symptoms? Symptoms most commonly affect the lower legs, but may also affect the hands, torso, arms, or feet. Symptoms include:  Groups of tiny, red spots.  Blister-like sores that leak fluid. These sores may grow together and form circular patches. After a long time, they may become crusty and then scaly.  Well-defined patches of pink, red, or brown skin.  Itchiness and burning, ranging from mild to severe. Itchiness may be worse at night and may cause trouble sleeping. Scratching lesions can cause bleeding.  How is this diagnosed? This condition is diagnosed based on a physical exam and your medical history. Usually more tests are not needed, but you may need a swab test to check for skin infection. This test involves swabbing an affected area and testing the sample for bacteria (culture). You may work with a health care provider who specializes in the skin (dermatologist) to help diagnose and treat this condition. How is this treated? There is no cure for this condition, but treatment  can help relieve symptoms. Depending on how severe your symptoms are, your healthcare provider may suggest:  Medicine applied to the skin to reduce swelling and irritation (topical corticosteroids).  Medicine taken by mouth to reduce itching (oral antihistamines).  Antibiotic medicine to take by mouth (oral antibiotic) or to apply to your skin (topical antibiotic), if you have a skin infection.  Light therapy (phototherapy). This involves shining ultraviolet (UV) light on affected skin to reduce itchiness and inflammation.  Soaking in a bath that contains a type of salt that dries out blisters (potassium permanganate soaks).  Follow these instructions at home: Medicines  Take over-the-counter and prescription medicines only as told by your health care provider.  If you were prescribed an antibiotic, take or apply it as told by your health care provider. Do not stop using the antibiotic even if you start to feel better. Skin Care  Keep your fingernails short to avoid breaking open the skin when you scratch.  Wash your hands with mild soap and water to avoid infection.  Pat your skin dry after bathing or washing your hands. Avoid rubbing your skin.  Keep your skin hydrated. To do this: ? Avoid very hot water. Take lukewarm baths or showers. ? Apply moisturizer within three minutes of bathing. This locks in moisture. ? Use a humidifier when you have the heating or air conditioning on. This will add moisture to the air.  Identify and avoid things that trigger symptoms or irritate your skin. Triggers may include taking long, hot showers  or baths, or not using creams or ointments to moisturize. Certain soaps may also trigger this condition. General instructions  Dress in clothes made of cotton or cotton blends. Avoid wearing clothes with wool fabric.  Avoid activities that may cause skin injury. Wear protective clothing when doing outdoor activities such as gardening or hiking. Cuts,  scrapes, and insect bites can make symptoms worse.  Keep all follow-up visits as told by your health care provider. This is important. Contact a health care provider if:  You develop a yellowish crust on an area of affected skin.  You have symptoms that do not go away with treatment or home care methods. Get help right away if:  You have more redness, pain, pus, or swelling. Summary  Nummular eczema is a common disease that causes red, circular, crusted (plaque) lesions that may be itchy.  The cause of this condition is not known. It may be related to certain skin sensitivities.  Treatments may include medicines to reduce swelling and irritation, avoiding triggers, and keeping your skin hydrated. This information is not intended to replace advice given to you by your health care provider. Make sure you discuss any questions you have with your health care provider. Document Released: 04/29/2017 Document Revised: 04/29/2017 Document Reviewed: 04/29/2017 Elsevier Interactive Patient Education  2018 [employer].

## 2018-07-05 NOTE — Progress Notes (Signed)
[facility] Note  07/06/2018     CHIEF COMPLAINT Patient presents for Retina Follow Up   HISTORY OF PRESENT ILLNESS: Alejandra Davis is a 82 y.o. female who presents to the clinic today for:   HPI    Retina Follow Up    Patient presents with  Diabetic Retinopathy.  In both eyes.  Severity is moderate.  Duration of 4 months.  Since onset it is stable.  I, the attending physician,  performed the HPI with the patient and updated documentation appropriately.          Comments    Pt presents for DM exam, pt states VA has been stable since last visit, pt denies flashes, floaters, pain or wavy vision, pt denies the use of gtts, pt does not check blood sugar at home and does not know last A1C       Last edited by [name], MD on 07/06/2018  1:34 PM. (History)      Lab Results  Component Value Date   HGBA1C 6.5 02/25/2018     Referring physician: Libby Maw, MD [place], [place] [address]  HISTORICAL INFORMATION:   Selected notes from the MEDICAL RECORD NUMBER Referred by [name] for DM exam LEE-  Ocular Hx-  PMH- HTN, thyroid disease, DM (A1C- 6.5)    CURRENT MEDICATIONS: Current Outpatient Medications (Ophthalmic Drugs)  Medication Sig  . dorzolamide-timolol (COSOPT) 22.3-6.8 MG/ML ophthalmic solution Place 1 drop into the right eye 2 (two) times daily.   No current facility-administered medications for this visit.  (Ophthalmic Drugs)   Current Outpatient Medications (Other)  Medication Sig  . amLODipine (NORVASC) 10 MG tablet Take 10 mg by mouth daily.    . calcium-vitamin D (OSCAL 500/200 D-3) 500-200 MG-UNIT tablet Take 1 tablet by mouth 2 (two) times daily.  Marland Kitchen levothyroxine (SYNTHROID, LEVOTHROID) 75 MCG tablet Take 1 tablet (75 mcg total) by mouth daily.  . metFORMIN (GLUCOPHAGE-XR) 500 MG 24 hr tablet Take 1 tablet (500 mg total) by mouth at bedtime.  . metoprolol succinate (TOPROL-XL) 50 MG  24 hr tablet Take 1 tablet (50 mg total) by mouth daily.  Marland Kitchen spironolactone (ALDACTONE) 25 MG tablet Take 25 mg by mouth daily.  Marland Kitchen triamcinolone ointment (KENALOG) 0.1 % Apply 1 application topically 2 (two) times daily. For rash on thighs and arms.   No current facility-administered medications for this visit.  (Other)      REVIEW OF SYSTEMS: ROS    Positive for: Endocrine, Cardiovascular, Eyes   Negative for: Constitutional, Gastrointestinal, Neurological, Skin, Genitourinary, Musculoskeletal, HENT, Respiratory, Psychiatric, Allergic/Imm, Heme/Lymph   Last edited by Debbrah Alar, COT on 07/06/2018  1:15 PM. (History)       ALLERGIES No Known Allergies  PAST MEDICAL HISTORY Past Medical History:  Diagnosis Date  . Chicken pox   . Cystocele   . Diabetes mellitus without complication ([place])   . Hypertension   . Thyroid disease    hypothyroidism   Past Surgical History:  Procedure Laterality Date  . ABDOMINAL HYSTERECTOMY  1990   TAH BSO  . OOPHORECTOMY     BSO  . Vaginal Bx     Papilloma    FAMILY HISTORY Family History  Problem Relation Age of Onset  . Sickle cell anemia Other   . Hypertension Mother   . Cancer Father        LIVER  . Heart disease Brother   .  Cancer Brother        STOMACH    SOCIAL HISTORY Social History   Tobacco Use  . Smoking status: Never Smoker  . Smokeless tobacco: Never Used  Substance Use Topics  . Alcohol use: No  . Drug use: No         OPHTHALMIC EXAM:  Base Eye Exam    Visual Acuity (Snellen - Linear)      Right Left   Dist [place] 20/50 -1 20/60   Dist ph [place] 20/40 -2 20/30 -1       Tonometry (Tonopen, 1:22 PM)      Right Left   Pressure 26 20       Tonometry #2 (Tonopen, 1:49 PM)      Right Left   Pressure 24        Pupils      Dark Light Shape React APD   Right 3 2 Round Slow None   Left 3 2 Round Slow None       Visual Fields (Counting fingers)      Left Right    Full Full       Extraocular  Movement      Right Left    Full, Ortho Full, Ortho       Neuro/Psych    Oriented x3:  Yes   Mood/Affect:  Normal       Dilation    Both eyes:  1.0% Mydriacyl, 2.5% Phenylephrine @ 1:22 PM        Slit Lamp and Fundus Exam    Slit Lamp Exam      Right Left   Lids/Lashes Dermatochalasis - upper lid, Meibomian gland dysfunction Dermatochalasis - upper lid, Meibomian gland dysfunction   Conjunctiva/Sclera Melanosis Melanosis   Cornea Arcus, 1+ Punctate epithelial erosions, mild Anterior basement membrane dystrophy Arcus, Inferior 1+ Punctate epithelial erosions   Anterior Chamber Moderate depth Moderate depth   Iris Round and dilated, No NVI Round and dilated, No NVI   Lens 2-3+ Nuclear sclerosis, 3-4+ Cortical cataract 3+ Nuclear sclerosis, 3-4+ Cortical cataract   Vitreous Vitreous syneresis Vitreous syneresis       Fundus Exam      Right Left   Disc Sharp rim, inferior notch Normal   C/D Ratio 0.5 0.5   Macula Blunted foveal reflex, single Microaneurysms superior to fovea, Epiretinal membrane Blunted foveal reflex, Epiretinal membrane, No heme or edema   Vessels Mild Vascular attenuation Vascular attenuation   Periphery Attached, Temporal Lattice degeneration Attached, scattered Reticular degeneration          IMAGING AND PROCEDURES  Imaging and Procedures for 03/07/18  OCT, Retina - OU - Both Eyes       Right Eye Quality was good. Central Foveal Thickness: 264. Progression has been stable. Findings include abnormal foveal contour, no SRF, epiretinal membrane, intraretinal fluid (Stable ERM with cystic changes).   Left Eye Quality was good. Central Foveal Thickness: 253. Progression has been stable. Findings include abnormal foveal contour, epiretinal membrane, no IRF, no SRF (Stable ERM).   Notes *Images captured and stored on drive  Diagnosis / Impression:  ERM OU [id number] ERM with cystic changes OS: interval improvement in cystic changes  Clinical  management:  See below  Abbreviations: NFP - Normal foveal profile. CME - cystoid macular edema. PED - pigment epithelial detachment. IRF - intraretinal fluid. SRF - subretinal fluid. EZ - ellipsoid zone. ERM - epiretinal membrane. ORA - outer retinal atrophy. ORT - outer retinal tubulation. SRHM -  subretinal hyper-reflective material                  ASSESSMENT/PLAN:    ICD-10-CM   1. Diabetes mellitus type 2 without retinopathy ([place]) E11.9   2. Epiretinal membrane (ERM) of both eyes H35.373   3. Retinal edema H35.81 OCT, Retina - OU - Both Eyes  4. Combined forms of age-related cataract of both eyes H25.813   5. Ocular hypertension of right eye H40.051   6. Glaucoma suspect of right eye H40.001     1. Diabetes mellitus, type 2 without retinopathy - The incidence, risk factors for progression, natural history and treatment options for diabetic retinopathy  were discussed with patient.   - The need for close monitoring of blood glucose, blood pressure, and serum lipids, avoiding cigarette or any type of tobacco, and the need for long term follow up was also discussed with patient. - f/u in 1 year  2,3. Epiretinal membrane, OU- The natural history, anatomy, potential for loss of vision, and treatment options including vitrectomy techniques and the complications of endophthalmitis, retinal detachment, vitreous hemorrhage, cataract progression and permanent vision loss discussed with the patient. - relatively mild ERM OU -- blunted central foveal depression - OD with mild cystic changes; OS without cystic changes or edema - discussed findings and prognosis - recommend monitoring for now -- biggest issue is cataracts OU - F/U 3-4 months  4. Combined form age-related cataract OU-  - The symptoms of cataract, surgical options, and treatments and risks were discussed with patient. - discussed diagnosis and progression - visually significant -- reports significant glare symptoms and  trouble driving at night - will refer to Dr. [name] for cataract eval  5, 6. Ocular hypertension / Glaucoma suspect OD>OS-  - IOP ~25 OD, 20 OS at both clinic visits - denies any family hx of glaucoma - start cosopt BID OU - will refer to Dr. [name] for full glaucoma evaluation and management  Ophthalmic Meds Ordered this visit:  Meds ordered this encounter  Medications  . dorzolamide-timolol (COSOPT) 22.3-6.8 MG/ML ophthalmic solution    Sig: Place 1 drop into the right eye 2 (two) times daily.    Dispense:  10 mL    Refill:  1       Return in about 4 months (around 11/06/2018) for F/U ERM OU, DFE, OCT.  There are no Patient Instructions on file for this visit.   Explained the diagnoses, plan, and follow up with the patient and they expressed understanding.  Patient expressed understanding of the importance of proper follow up care.   This document serves as a record of services personally performed by Gardiner Sleeper, MD, PhD. It was created on their behalf by Catha Brow, [place], a certified ophthalmic assistant. The creation of this record is the provider's dictation and/or activities during the visit.  Electronically signed by: Catha Brow, COA  07.10.19 10:53 PM    Gardiner Sleeper, M.D., Ph.D. Diseases & Surgery of the Retina and Vitreous [facility]   I have reviewed the above documentation for accuracy and completeness, and I agree with the above. Gardiner Sleeper, M.D., Ph.D. 07/06/18 10:57 PM    Abbreviations: M myopia (nearsighted); A astigmatism; H hyperopia (farsighted); P presbyopia; Mrx spectacle prescription;  CTL contact lenses; OD right eye; OS left eye; OU both eyes  XT exotropia; ET esotropia; PEK punctate epithelial keratitis; PEE punctate epithelial erosions; DES dry eye syndrome; MGD meibomian gland dysfunction; ATs  artificial tears; PFAT's preservative free artificial tears; [place] nuclear sclerotic cataract; PSC posterior  subcapsular cataract; ERM epi-retinal membrane; PVD posterior vitreous detachment; RD retinal detachment; DM diabetes mellitus; DR diabetic retinopathy; NPDR non-proliferative diabetic retinopathy; PDR proliferative diabetic retinopathy; CSME clinically significant macular edema; DME diabetic macular edema; dbh dot blot hemorrhages; CWS cotton wool spot; POAG primary open angle glaucoma; C/D cup-to-disc ratio; HVF humphrey visual field; GVF goldmann visual field; OCT optical coherence tomography; IOP intraocular pressure; BRVO Branch retinal vein occlusion; CRVO central retinal vein occlusion; CRAO central retinal artery occlusion; BRAO branch retinal artery occlusion; RT retinal tear; SB scleral buckle; PPV pars plana vitrectomy; VH Vitreous hemorrhage; PRP panretinal laser photocoagulation; IVK intravitreal kenalog; VMT vitreomacular traction; MH Macular hole;  NVD neovascularization of the disc; NVE neovascularization elsewhere; AREDS age related eye disease study; ARMD age related macular degeneration; POAG primary open angle glaucoma; EBMD epithelial/anterior basement membrane dystrophy; ACIOL anterior chamber intraocular lens; IOL intraocular lens; PCIOL posterior chamber intraocular lens; Phaco/IOL phacoemulsification with intraocular lens placement; [place] photorefractive keratectomy; LASIK laser assisted in situ keratomileusis; HTN hypertension; DM diabetes mellitus; COPD chronic obstructive pulmonary disease

## 2018-07-06 ENCOUNTER — Encounter (INDEPENDENT_AMBULATORY_CARE_PROVIDER_SITE_OTHER): Payer: Self-pay | Admitting: Ophthalmology

## 2018-07-06 ENCOUNTER — Ambulatory Visit (INDEPENDENT_AMBULATORY_CARE_PROVIDER_SITE_OTHER): Payer: Medicare Other | Admitting: Ophthalmology

## 2018-07-06 DIAGNOSIS — H3581 Retinal edema: Secondary | ICD-10-CM | POA: Diagnosis not present

## 2018-07-06 DIAGNOSIS — H35373 Puckering of macula, bilateral: Secondary | ICD-10-CM | POA: Diagnosis not present

## 2018-07-06 DIAGNOSIS — H40051 Ocular hypertension, right eye: Secondary | ICD-10-CM

## 2018-07-06 DIAGNOSIS — H25813 Combined forms of age-related cataract, bilateral: Secondary | ICD-10-CM | POA: Diagnosis not present

## 2018-07-06 DIAGNOSIS — E119 Type 2 diabetes mellitus without complications: Secondary | ICD-10-CM

## 2018-07-06 DIAGNOSIS — H40001 Preglaucoma, unspecified, right eye: Secondary | ICD-10-CM

## 2018-07-06 MED ORDER — DORZOLAMIDE HCL-TIMOLOL MAL 2-0.5 % OP SOLN: 1.0000 [drp] | Freq: Two times a day (BID) | OPHTHALMIC | 1 refills | Status: DC

## 2018-08-01 ENCOUNTER — Other Ambulatory Visit: Payer: Self-pay | Admitting: Family Medicine

## 2018-08-17 ENCOUNTER — Ambulatory Visit: Payer: Medicare Other | Admitting: Neurology

## 2018-08-28 ENCOUNTER — Encounter (INDEPENDENT_AMBULATORY_CARE_PROVIDER_SITE_OTHER): Payer: Self-pay | Admitting: Ophthalmology

## 2018-08-31 ENCOUNTER — Ambulatory Visit: Payer: Medicare Other | Admitting: Family Medicine

## 2018-09-01 ENCOUNTER — Ambulatory Visit: Payer: Medicare Other | Admitting: Family Medicine

## 2018-09-01 ENCOUNTER — Encounter: Payer: Self-pay | Admitting: Family Medicine

## 2018-09-01 VITALS — BP 128/90 | HR 73 | Ht 63.0 in | Wt 130.0 lb

## 2018-09-01 DIAGNOSIS — E119 Type 2 diabetes mellitus without complications: Secondary | ICD-10-CM | POA: Diagnosis not present

## 2018-09-01 DIAGNOSIS — M8580 Other specified disorders of bone density and structure, unspecified site: Secondary | ICD-10-CM | POA: Diagnosis not present

## 2018-09-01 DIAGNOSIS — E079 Disorder of thyroid, unspecified: Secondary | ICD-10-CM

## 2018-09-01 DIAGNOSIS — R011 Cardiac murmur, unspecified: Secondary | ICD-10-CM | POA: Insufficient documentation

## 2018-09-01 DIAGNOSIS — I10 Essential (primary) hypertension: Secondary | ICD-10-CM | POA: Diagnosis not present

## 2018-09-01 DIAGNOSIS — R413 Other amnesia: Secondary | ICD-10-CM

## 2018-09-01 MED ORDER — CALCIUM CARBONATE-VITAMIN D 500-200 MG-UNIT PO TABS: 1.0000 | ORAL_TABLET | Freq: Two times a day (BID) | ORAL | 1 refills | Status: DC

## 2018-09-01 MED ORDER — AMLODIPINE BESYLATE 10 MG PO TABS: 10.0000 mg | ORAL_TABLET | Freq: Every day | ORAL | 1 refills | Status: DC

## 2018-09-01 MED ORDER — LEVOTHYROXINE SODIUM 75 MCG PO TABS: 75.0000 ug | ORAL_TABLET | Freq: Every day | ORAL | 1 refills | Status: DC

## 2018-09-01 MED ORDER — METFORMIN HCL ER 500 MG PO TB24: 500.0000 mg | ORAL_TABLET | Freq: Every day | ORAL | 1 refills | Status: DC

## 2018-09-01 MED ORDER — METOPROLOL SUCCINATE ER 50 MG PO TB24: 50.0000 mg | ORAL_TABLET | Freq: Every day | ORAL | 1 refills | Status: DC

## 2018-09-01 MED ORDER — SPIRONOLACTONE 25 MG PO TABS: 25.0000 mg | ORAL_TABLET | Freq: Every day | ORAL | 0 refills | Status: DC

## 2018-09-01 NOTE — Patient Instructions (Signed)
Heart Murmur A heart murmur is an extra sound that is caused by chaotic blood flow. The murmur can be heard as a "hum" or "whoosh" sound when blood flows through the heart. The heart has four areas called chambers. Valves separate the upper and lower chambers from each other (tricuspid valve and mitral valve) and separate the lower chambers of the heart from pathways that lead away from the heart (aortic valve and pulmonary valve). Normally, the valves open to let blood flow through or out of your heart, and then they shut to keep the blood from flowing backward. There are two types of heart murmurs:  Innocent murmurs. Most people with this type of heart murmur do not have a heart problem. Many children have innocent heart murmurs. Your health care provider may suggest some basic testing to find out whether your murmur is an innocent murmur. If an innocent heart murmur is found, there is no need for further tests or treatment and no need to restrict activities or stop playing sports.  Abnormal murmurs. These types of murmurs can occur in children and adults. Abnormal murmurs may be a sign of a more serious heart condition, such as a heart defect present at birth (congenital defect) or heart valve disease.  What are the causes? This condition is caused by heart valves that are not working properly. In children, abnormal heart murmurs are typically caused by congenital defects. In adults, abnormal murmurs are usually from heart valve problems caused by disease, infection, or aging. Three types of heart valve defects can cause a murmur:  Regurgitation. This is when blood leaks back through the valve in the wrong direction.  Mitral valve prolapse. This is when the mitral valve of the heart has a loose flap and does not close tightly.  Stenosis. This is when a valve does not open enough and blocks blood flow.  This condition may also be caused by:  Pregnancy.  Fever.  Overactive thyroid  gland.  Anemia.  Exercise.  Rapid growth spurts (in children).  What are the signs or symptoms? Innocent murmurs do not cause symptoms, and many people with abnormal murmurs may or may not have symptoms. If symptoms do develop, they may include:  Shortness of breath.  Blue coloring of the skin, especially on the fingertips.  Chest pain.  Palpitations, or feeling a fluttering or skipped heartbeat.  Fainting.  Persistent cough.  Getting tired much faster than expected.  Swelling in the abdomen, feet, or ankles.  How is this diagnosed? This condition may be diagnosed during a routine physical or other exam. If your health care provider hears a murmur with a stethoscope, he or she will listen for:  Where the murmur is located in your heart.  How long the murmur lasts (duration).  When the murmur is heard during the heartbeat.  How loud the murmur is. This may help the health care provider figure out what is causing the murmur.  You may be referred to a heart specialist (cardiologist). You may also have other tests, including:  Electrocardiogram (ECG or EKG). This test measures the electrical activity of your heart.  Echocardiogram. This test uses high frequency sound waves to make pictures of your heart.  MRI or chest X-ray.  Cardiac catheterization. This test looks at blood flow through the heart.  For children and adults who have an abnormal heart murmur and want to stay active, it is important to complete testing, review test results, and receive recommendations from your health care   provider. If heart disease is present, it may not be safe to play or be active. How is this treated? Heart murmurs themselves do not need treatment. In some cases, a heart murmur may go away on its own. If an underlying problem or disease is causing the murmur, you may need treatment. If treatment is needed, it will depend on the type and severity of the disease or heart problem causing  the murmur. Treatment may include:  Medicine.  Surgery.  Dietary and lifestyle changes.  Follow these instructions at home:  Talk with your health care provider before participating in sports or other activities that require a lot of effort and energy (are strenuous).  Learn as much as possible about your condition and any related diseases. Ask your health care provider if you may at risk for any medical emergencies.  Talk with your health care provider about what symptoms you should look out for.  It is up to you to get your test results. Ask your health care provider, or the department that is doing the test, when your results will be ready.  Keep all follow-up visits as told by your health care provider. This is important. Contact a health care provider if:  You feel light-headed.  You are frequently short of breath.  You feel more tired than usual.  You are having a hard time keeping up with normal activities or fitness routines.  You have swelling in your ankles or feet.  You have chest pain.  You notice that your heart often beats irregularly.  You develop any new symptoms. Get help right away if:  You develop severe chest pain.  You are having trouble breathing.  You have fainting spells.  Your symptoms suddenly get worse. These symptoms may represent a serious problem that is an emergency. Do not wait to see if the symptoms will go away. Get medical help right away. Call your local emergency services (911 in the [place] Do not drive yourself to the hospital. Summary  Normally, the heart valves open to let blood flow through or out of your heart, and then they shut to keep the blood from flowing backward.  Heart murmur is caused by heart valves that are not working properly.  You may need treatment if an underlying problem or disease is causing the heart murmur. Treatment may include medicine, surgery, or dietary and lifestyle changes.  Talk with your  health care provider before participating in sports or other activities that require a lot of effort and energy (are strenuous).  Talk with your health care provider about what symptoms you should watch out for. This information is not intended to replace advice given to you by your health care provider. Make sure you discuss any questions you have with your health care provider. Document Released: 01/21/2005 Document Revised: 12/02/2016 Document Reviewed: 12/02/2016 Elsevier Interactive Patient Education  2018 [employer]  

## 2018-09-01 NOTE — Progress Notes (Signed)
Subjective:  Patient ID: Alejandra Davis, female    DOB: 1936/03/03  Age: 82 y.o. MRN: [id number]  CC: Follow-up   HPI Alejandra Davis presents for follow-up of her hypertension, hypothyroidism and type 2 diabetes.  Blood pressure is well controlled with her metoprolol Aldactone and amlodipine.  She is having no issues with any of these medicines.  Diabetes is controlled is well with low-dose metformin taken at bedtime.  She is status post retina check.  She has been referred for cataract extraction.  Has upcoming appointment with neurology for memory assessment.  She is taking her Synthroid on a fasting stomach an hour before he eating morning.  She is nonfasting today.  She is accompanied by her daughter.  He has no history of heart murmur  Outpatient Medications Prior to Visit  Medication Sig Dispense Refill  . dorzolamide-timolol (COSOPT) 22.3-6.8 MG/ML ophthalmic solution Place 1 drop into the right eye 2 (two) times daily. 10 mL 1  . triamcinolone ointment (KENALOG) 0.1 % Apply 1 application topically 2 (two) times daily. For rash on thighs and arms. 30 g 0  . amLODipine (NORVASC) 10 MG tablet TAKE 1 TABLET BY MOUTH ONCE DAILY 90 tablet 1  . calcium-vitamin D (OSCAL 500/200 D-3) 500-200 MG-UNIT tablet Take 1 tablet by mouth 2 (two) times daily. 180 tablet 1  . levothyroxine (SYNTHROID, LEVOTHROID) 75 MCG tablet Take 1 tablet (75 mcg total) by mouth daily. 90 tablet 1  . metFORMIN (GLUCOPHAGE-XR) 500 MG 24 hr tablet Take 1 tablet (500 mg total) by mouth at bedtime. 90 tablet 1  . metoprolol succinate (TOPROL-XL) 50 MG 24 hr tablet Take 1 tablet (50 mg total) by mouth daily. 90 tablet 1  . spironolactone (ALDACTONE) 25 MG tablet Take 25 mg by mouth daily.     No facility-administered medications prior to visit.     ROS Review of Systems  Constitutional: Negative.   HENT: Negative.   Eyes: Negative for photophobia.  Respiratory: Negative.  Negative for choking and shortness of breath.     Cardiovascular: Negative.  Negative for chest pain.  Gastrointestinal: Negative.   Endocrine: Negative for polyphagia and polyuria.  Genitourinary: Negative.   Skin: Negative for pallor and wound.  Allergic/Immunologic: Negative for immunocompromised state.  Neurological: Negative for weakness and headaches.  Hematological: Does not bruise/bleed easily.  Psychiatric/Behavioral: Negative.     Objective:  BP 128/90   Pulse 73   Ht 5\' 3"  (1.6 m)   Wt 130 lb (59 kg)   SpO2 98%   BMI 23.03 kg/m   BP Readings from Last 3 Encounters:  09/01/18 128/90  06/29/18 110/70  06/07/18 126/80    Wt Readings from Last 3 Encounters:  09/01/18 130 lb (59 kg)  06/29/18 132 lb (59.9 kg)  06/07/18 130 lb (59 kg)    Physical Exam  Constitutional: She is oriented to person, place, and time. She appears well-developed and well-nourished. No distress.  HENT:  Head: Normocephalic and atraumatic.  Right Ear: External ear normal.  Left Ear: External ear normal.  Mouth/Throat: Oropharynx is clear and moist.  Eyes: Pupils are equal, round, and reactive to light. Conjunctivae and EOM are normal. Right eye exhibits no discharge. Left eye exhibits no discharge. No scleral icterus.  Neck: Neck supple. No JVD present. No tracheal deviation present. No thyromegaly present.  Cardiovascular: Normal rate and regular rhythm.  Murmur heard.  Systolic murmur is present with a grade of 2/6. Pulmonary/Chest: Effort normal and breath sounds  normal.  Abdominal: Bowel sounds are normal.  Lymphadenopathy:    She has no cervical adenopathy.  Neurological: She is alert and oriented to person, place, and time.  Skin: Skin is warm and dry. She is not diaphoretic.  Psychiatric: She has a normal mood and affect. Her behavior is normal.    Lab Results  Component Value Date   WBC 6.1 02/25/2018   HGB 13.4 02/25/2018   HCT 41.8 02/25/2018   PLT 286 02/25/2018   GLUCOSE 120 (H) 02/25/2018   CHOL 197 02/25/2018    TRIG 82.0 02/25/2018   HDL 62.80 02/25/2018   LDLCALC 118 (H) 02/25/2018   ALT 12 02/25/2018   AST 17 02/25/2018   NA 141 02/25/2018   K 4.8 02/25/2018   CL 105 02/25/2018   CREATININE 1.16 02/25/2018   BUN 19 02/25/2018   CO2 28 02/25/2018   TSH 1.84 02/25/2018   HGBA1C 6.5 02/25/2018   MICROALBUR 2.9 (H) 02/25/2018   Flowsheets    09/01/18 1421  Last Filed Value        Three Word Registration  Was the patient able to repeat memory words in 3 tries? yes yes  Which version was used? Version1 : banana, sunrise, chair Version1 : banana, sunrise, chair  Clock Drawing  Clock numbers correct? yes yes  Clock time correct (11:10)? yes yes  Normal clock drawing test? no no  Three Word Recall   How many words correct? 2 2  Which version was used? Version 1: banana, sunrise, chair Version 1: banana, sunrise, chair  Mini-Cog Scoring  Mini-Cog Scoring 2 (calculated) 2 (calculated)      No results found.  Assessment & Plan:   Alejandra Davis was seen today for follow-up.  Diagnoses and all orders for this visit:  Essential hypertension -     Cancel: Basic metabolic panel -     Cancel: Urinalysis, Routine w reflex microscopic -     amLODipine (NORVASC) 10 MG tablet; Take 1 tablet (10 mg total) by mouth daily. -     metoprolol succinate (TOPROL-XL) 50 MG 24 hr tablet; Take 1 tablet (50 mg total) by mouth daily. -     spironolactone (ALDACTONE) 25 MG tablet; Take 1 tablet (25 mg total) by mouth daily. -     CBC; Future -     Comprehensive metabolic panel; Future -     Urinalysis, Routine w reflex microscopic; Future -     Microalbumin / creatinine urine ratio; Future  Thyroid disease -     Cancel: TSH -     levothyroxine (SYNTHROID, LEVOTHROID) 75 MCG tablet; Take 1 tablet (75 mcg total) by mouth daily. -     CBC; Future -     TSH; Future  Controlled type 2 diabetes mellitus without complication, without long-term current use of insulin (HCC) -     Cancel: Microalbumin /  creatinine urine ratio -     Cancel: Hemoglobin A1c -     metFORMIN (GLUCOPHAGE-XR) 500 MG 24 hr tablet; Take 1 tablet (500 mg total) by mouth at bedtime. -     CBC; Future -     Comprehensive metabolic panel; Future -     Hemoglobin A1c; Future -     Lipid panel; Future -     Urinalysis, Routine w reflex microscopic; Future -     Microalbumin / creatinine urine ratio; Future  Osteopenia, unspecified location -     calcium-vitamin D (OSCAL 500/200 D-3) 500-200 MG-UNIT tablet; Take 1  tablet by mouth 2 (two) times daily.  Short-term memory loss  Heart murmur -     Ambulatory referral to Cardiology   I have changed Hans Eden. Devore's amLODipine and spironolactone. I am also having her maintain her triamcinolone ointment, dorzolamide-timolol, calcium-vitamin D, levothyroxine, metFORMIN, and metoprolol succinate.  Meds ordered this encounter  Medications  . amLODipine (NORVASC) 10 MG tablet    Sig: Take 1 tablet (10 mg total) by mouth daily.    Dispense:  90 tablet    Refill:  1  . calcium-vitamin D (OSCAL 500/200 D-3) 500-200 MG-UNIT tablet    Sig: Take 1 tablet by mouth 2 (two) times daily.    Dispense:  180 tablet    Refill:  1  . levothyroxine (SYNTHROID, LEVOTHROID) 75 MCG tablet    Sig: Take 1 tablet (75 mcg total) by mouth daily.    Dispense:  90 tablet    Refill:  1  . metFORMIN (GLUCOPHAGE-XR) 500 MG 24 hr tablet    Sig: Take 1 tablet (500 mg total) by mouth at bedtime.    Dispense:  90 tablet    Refill:  1  . metoprolol succinate (TOPROL-XL) 50 MG 24 hr tablet    Sig: Take 1 tablet (50 mg total) by mouth daily.    Dispense:  90 tablet    Refill:  1  . spironolactone (ALDACTONE) 25 MG tablet    Sig: Take 1 tablet (25 mg total) by mouth daily.    Dispense:  90 tablet    Refill:  0   Labs are pending.  Cardiology referral for echocardiogram to assess systolic murmur.  Follow-up will be in 6 months pending stable lab values.  Follow-up: Return in about 6 months  (around 03/02/2019), or if symptoms worsen or fail to improve.  Libby Maw, MD

## 2018-09-06 ENCOUNTER — Other Ambulatory Visit (INDEPENDENT_AMBULATORY_CARE_PROVIDER_SITE_OTHER): Payer: Medicare Other

## 2018-09-06 DIAGNOSIS — I10 Essential (primary) hypertension: Secondary | ICD-10-CM

## 2018-09-06 DIAGNOSIS — E079 Disorder of thyroid, unspecified: Secondary | ICD-10-CM | POA: Diagnosis not present

## 2018-09-06 DIAGNOSIS — E119 Type 2 diabetes mellitus without complications: Secondary | ICD-10-CM

## 2018-09-06 LAB — URINALYSIS, ROUTINE W REFLEX MICROSCOPIC
BILIRUBIN URINE: NEGATIVE
Hgb urine dipstick: NEGATIVE
Ketones, ur: NEGATIVE
Nitrite: NEGATIVE
PH: 7 (ref 5.0–8.0)
RBC / HPF: NONE SEEN (ref 0–?)
Specific Gravity, Urine: 1.01 (ref 1.000–1.030)
URINE GLUCOSE: NEGATIVE
Urobilinogen, UA: 0.2 (ref 0.0–1.0)

## 2018-09-06 LAB — LIPID PANEL
Cholesterol: 180 mg/dL (ref 0–200)
HDL: 74.6 mg/dL (ref >39.00)
LDL CALC: 90 mg/dL (ref 0–99)
NonHDL: 105.29
Total CHOL/HDL Ratio: 2
Triglycerides: 76 mg/dL (ref 0.0–149.0)
VLDL: 15.2 mg/dL (ref 0.0–40.0)

## 2018-09-06 LAB — COMPREHENSIVE METABOLIC PANEL
ALK PHOS: 54 U/L (ref 39–117)
ALT: 12 U/L (ref 0–35)
AST: 17 U/L (ref 0–37)
Albumin: 4.1 g/dL (ref 3.5–5.2)
BUN: 12 mg/dL (ref 6–23)
CHLORIDE: 105 meq/L (ref 96–112)
CO2: 28 mEq/L (ref 19–32)
CREATININE: 0.99 mg/dL (ref 0.40–1.20)
Calcium: 10.1 mg/dL (ref 8.4–10.5)
GFR: 69.43 mL/min (ref >60.00)
GLUCOSE: 132 mg/dL — AB (ref 70–99)
POTASSIUM: 4.9 meq/L (ref 3.5–5.1)
SODIUM: 141 meq/L (ref 135–145)
TOTAL PROTEIN: 7.8 g/dL (ref 6.0–8.3)
Total Bilirubin: 0.5 mg/dL (ref 0.2–1.2)

## 2018-09-06 LAB — CBC
HEMATOCRIT: 40.3 % (ref 36.0–46.0)
Hemoglobin: 13.2 g/dL (ref 12.0–15.0)
MCHC: 32.8 g/dL (ref 30.0–36.0)
MCV: 81.7 fl (ref 78.0–100.0)
Platelets: 263 10*3/uL (ref 150.0–400.0)
RBC: 4.93 Mil/uL (ref 3.87–5.11)
RDW: 15.9 % — AB (ref 11.5–15.5)
WBC: 5.4 10*3/uL (ref 4.0–10.5)

## 2018-09-06 LAB — HEMOGLOBIN A1C: HEMOGLOBIN A1C: 6.5 % (ref 4.6–6.5)

## 2018-09-06 LAB — TSH: TSH: 2.29 u[IU]/mL (ref 0.35–4.50)

## 2018-09-07 LAB — MICROALBUMIN / CREATININE URINE RATIO
CREATININE, U: 61.2 mg/dL
MICROALB/CREAT RATIO: 18.3 mg/g (ref 0.0–30.0)
Microalb, Ur: 11.2 mg/dL — ABNORMAL HIGH (ref 0.0–1.9)

## 2018-09-22 ENCOUNTER — Ambulatory Visit: Payer: Medicare Other | Admitting: Internal Medicine

## 2018-09-22 ENCOUNTER — Encounter: Payer: Self-pay | Admitting: Family Medicine

## 2018-09-22 ENCOUNTER — Encounter: Payer: Self-pay | Admitting: Internal Medicine

## 2018-09-22 VITALS — BP 151/79 | HR 66 | Ht 62.5 in | Wt 128.6 lb

## 2018-09-22 DIAGNOSIS — I10 Essential (primary) hypertension: Secondary | ICD-10-CM

## 2018-09-22 DIAGNOSIS — E119 Type 2 diabetes mellitus without complications: Secondary | ICD-10-CM

## 2018-09-22 DIAGNOSIS — R011 Cardiac murmur, unspecified: Secondary | ICD-10-CM

## 2018-09-22 NOTE — Patient Instructions (Signed)
Medication Instructions:   Continue current medications  Labwork:  None   Testing/Procedures:  Your physician has requested that you have an echocardiogram. Echocardiography is a painless test that uses sound waves to create images of your heart. It provides your doctor with information about the size and shape of your heart and how well your heart's chambers and valves are working. This procedure takes approximately one hour. There are no restrictions for this procedure. -- done at [address] - [address]  Follow-Up:  Your physician recommends that you schedule a follow-up appointment with Dr. Debara Pickett after your echo (first available)    If you need a refill on your cardiac medications before your next appointment, please call your pharmacy.  Any Other Special Instructions Will Be Listed Below (If Applicable).

## 2018-09-22 NOTE — Progress Notes (Signed)
OFFICE CONSULT NOTE  Chief Complaint:  Number  Primary Care Physician: Libby Maw, MD  HPI:  Alejandra Davis is a 82 y.o. female who is being seen today for the evaluation of murmur at the request of Libby Maw,*.  This is a pleasant 82 year old female was kindly referred to me for evaluation of murmur.  She has a past medical history significant for type 2 diabetes, hypertension and hypothyroidism.  She has been asymptomatic and it was apparently noted that she had murmur on a recent physical exam.  She denies cardiac history.  Family history is not significant for heart disease in her parents however hypertension is in the family and she does have a brother who had heart disease.  No significant valvular heart disease is known.  She denies chest pain or worsening shortness of breath.  PMHx:  Past Medical History:  Diagnosis Date  . Chicken pox   . Cystocele   . Diabetes mellitus without complication ([place])   . Hypertension   . Thyroid disease    hypothyroidism    Past Surgical History:  Procedure Laterality Date  . ABDOMINAL HYSTERECTOMY  1990   TAH BSO  . OOPHORECTOMY     BSO  . Vaginal Bx     Papilloma    FAMHx:  Family History  Problem Relation Age of Onset  . Sickle cell anemia Other   . Hypertension Mother   . Cancer Father        LIVER  . Heart disease Brother   . Cancer Brother        STOMACH    SOCHx:   reports that she has never smoked. She has never used smokeless tobacco. She reports that she does not drink alcohol or use drugs.  ALLERGIES:  No Known Allergies  ROS: Pertinent items noted in HPI and remainder of comprehensive ROS otherwise negative.  HOME MEDS: Current Outpatient Medications on File Prior to Visit  Medication Sig Dispense Refill  . amLODipine (NORVASC) 10 MG tablet Take 1 tablet (10 mg total) by mouth daily. 90 tablet 1  . calcium-vitamin D (OSCAL 500/200 D-3) 500-200 MG-UNIT tablet Take 1 tablet by mouth 2  (two) times daily. 180 tablet 1  . dorzolamide-timolol (COSOPT) 22.3-6.8 MG/ML ophthalmic solution Place 1 drop into the right eye 2 (two) times daily. 10 mL 1  . levothyroxine (SYNTHROID, LEVOTHROID) 75 MCG tablet Take 1 tablet (75 mcg total) by mouth daily. 90 tablet 1  . metFORMIN (GLUCOPHAGE-XR) 500 MG 24 hr tablet Take 1 tablet (500 mg total) by mouth at bedtime. 90 tablet 1  . metoprolol succinate (TOPROL-XL) 50 MG 24 hr tablet Take 1 tablet (50 mg total) by mouth daily. 90 tablet 1  . spironolactone (ALDACTONE) 25 MG tablet Take 1 tablet (25 mg total) by mouth daily. 90 tablet 0  . triamcinolone ointment (KENALOG) 0.1 % Apply 1 application topically 2 (two) times daily. For rash on thighs and arms. 30 g 0   No current facility-administered medications on file prior to visit.     LABS/IMAGING: No results found for this or any previous visit (from the past 48 hour(s)). No results found.  LIPID PANEL:    Component Value Date/Time   CHOL 180 09/06/2018 0935   TRIG 76.0 09/06/2018 0935   HDL 74.60 09/06/2018 0935   CHOLHDL 2 09/06/2018 0935   VLDL 15.2 09/06/2018 0935   LDLCALC 90 09/06/2018 0935    WEIGHTS: Wt Readings from Last 3 Encounters:  09/22/18 128 lb 9.6 oz (58.3 kg)  09/01/18 130 lb (59 kg)  06/29/18 132 lb (59.9 kg)    VITALS: BP (!) 151/79   Pulse 66   Ht 5' 2.5" (1.588 m)   Wt 128 lb 9.6 oz (58.3 kg)   BMI 23.15 kg/m   EXAM: General appearance: alert and no distress Neck: no carotid bruit, no JVD and thyroid not enlarged, symmetric, no tenderness/mass/nodules Lungs: clear to auscultation bilaterally Heart: regular rate and rhythm, S1, S2 normal and systolic murmur: systolic ejection 3/6, crescendo at 2nd right intercostal space Abdomen: soft, non-tender; bowel sounds normal; no masses,  no organomegaly Extremities: extremities normal, atraumatic, no cyanosis or edema Pulses: 2+ and symmetric Skin: Skin color, texture, turgor normal. No rashes or  lesions Neurologic: Grossly normal : Pleasant  EKG: Sinus rhythm with PACs at 66- personally reviewed  ASSESSMENT: 1. Murmur 2. Hypertension 3. Type 2 diabetes  PLAN: 1.   [name] has a systolic murmur which could suggest aortic sclerosis or mild aortic stenosis.  It is early peaking.  I like to get an echocardiogram to further differentiate this and service a baseline study.  Blood pressure was elevated a little today but is generally been well controlled.  She is advised to monitor that at home and we will reassess it when she returns to discuss her echo findings.  Thanks again for the kind referral.  Pixie Casino, MD, FACC, [place] Director of the [facility] Lip[facility]  Cardiovascular Risk Reduction [facility] Diplomate of the American Board of Clinical Lipidology Attending Cardiologist  Direct Dial: [phone]  Fax: [phone]  Website:  [email or web address] 09/22/2018, 4:27 PM

## 2018-09-29 ENCOUNTER — Telehealth: Payer: Self-pay | Admitting: Family Medicine

## 2018-09-29 NOTE — Telephone Encounter (Signed)
Copied from [place] [id number]. Topic: Quick Communication - See Telephone Encounter >> Sep 29, 2018  3:28 PM Rutherford Nail, NT wrote: CRM for notification. See Telephone encounter for: 09/29/18. Diane, RN with [facility] calling and states the following: 1) quantoflo study that showed moderate Periferal vascular disease in both left and right foot. Anything greater that 0.99 shows there is an issue. Left foot was 0.87. Right foot was 0.72. 2) failed mini cognitive test. Very concerned about her memory. States that patient kept repeating that she did not know or did not remember. May need further work up for dementia. CB#:[phone]

## 2018-09-30 ENCOUNTER — Other Ambulatory Visit: Payer: Self-pay

## 2018-09-30 ENCOUNTER — Telehealth: Payer: Self-pay | Admitting: Emergency Medicine

## 2018-09-30 ENCOUNTER — Ambulatory Visit (HOSPITAL_COMMUNITY): Payer: Medicare Other | Attending: Cardiology

## 2018-09-30 DIAGNOSIS — R011 Cardiac murmur, unspecified: Secondary | ICD-10-CM | POA: Diagnosis present

## 2018-09-30 DIAGNOSIS — E119 Type 2 diabetes mellitus without complications: Secondary | ICD-10-CM | POA: Diagnosis not present

## 2018-09-30 DIAGNOSIS — I361 Nonrheumatic tricuspid (valve) insufficiency: Secondary | ICD-10-CM | POA: Diagnosis not present

## 2018-09-30 DIAGNOSIS — I10 Essential (primary) hypertension: Secondary | ICD-10-CM | POA: Insufficient documentation

## 2018-09-30 NOTE — Telephone Encounter (Signed)
Copied from [place] [id number]. Topic: General - Call Back - No Documentation >> Sep 30, 2018  1:02 PM Selinda Flavin B, NT wrote: Reason for CRM: Patient received a phone call. Unsure what it was regarding. No voicemail left. Please advise.

## 2018-10-03 NOTE — Telephone Encounter (Signed)
Has appointment with neurology.

## 2018-10-03 NOTE — Telephone Encounter (Signed)
I called and made patient aware that I do not see where we contacted her or why we would have contacted her. Patient verbalized understanding and did not remember calling us. We went over her future appointments and patient verbalized understanding.

## 2018-10-04 ENCOUNTER — Encounter: Payer: Self-pay | Admitting: Family Medicine

## 2018-10-11 ENCOUNTER — Encounter: Payer: Self-pay | Admitting: Internal Medicine

## 2018-10-11 ENCOUNTER — Ambulatory Visit: Payer: Medicare Other | Admitting: Internal Medicine

## 2018-10-11 VITALS — BP 156/85 | HR 63 | Ht 62.0 in | Wt 131.8 lb

## 2018-10-11 DIAGNOSIS — E119 Type 2 diabetes mellitus without complications: Secondary | ICD-10-CM

## 2018-10-11 DIAGNOSIS — I10 Essential (primary) hypertension: Secondary | ICD-10-CM

## 2018-10-11 DIAGNOSIS — R011 Cardiac murmur, unspecified: Secondary | ICD-10-CM | POA: Diagnosis not present

## 2018-10-11 NOTE — Progress Notes (Signed)
OFFICE CONSULT NOTE  Chief Complaint:  Follow-up echo  Primary Care Physician: Libby Maw, MD  HPI:  Alejandra Davis is a 82 y.o. female who is being seen today for the evaluation of murmur at the request of Libby Maw,*.  This is a pleasant 82 year old female was kindly referred to me for evaluation of murmur.  She has a past medical history significant for type 2 diabetes, hypertension and hypothyroidism.  She has been asymptomatic and it was apparently noted that she had murmur on a recent physical exam.  She denies cardiac history.  Family history is not significant for heart disease in her parents however hypertension is in the family and she does have a brother who had heart disease.  No significant valvular heart disease is known.  She denies chest pain or worsening shortness of breath.  10/11/2018  Alejandra Davis returns today for follow-up of her echo.  Please report that she does have some mild aortic sclerosis but no significant stenosis with normal systolic function.  Based on these findings and less concerned that she will have progressive worsening valvular heart disease however one should consider repeat study if she does developing worsening murmur, shortness of breath or at least a repeat study in 3 years.  PMHx:  Past Medical History:  Diagnosis Date  . Chicken pox   . Cystocele   . Diabetes mellitus without complication ([place])   . Hypertension   . Thyroid disease    hypothyroidism    Past Surgical History:  Procedure Laterality Date  . ABDOMINAL HYSTERECTOMY  1990   TAH BSO  . OOPHORECTOMY     BSO  . Vaginal Bx     Papilloma    FAMHx:  Family History  Problem Relation Age of Onset  . Sickle cell anemia Other   . Hypertension Mother   . Cancer Father        LIVER  . Heart disease Brother   . Cancer Brother        STOMACH    SOCHx:   reports that she has never smoked. She has never used smokeless tobacco. She reports that she does  not drink alcohol or use drugs.  ALLERGIES:  No Known Allergies  ROS: Pertinent items noted in HPI and remainder of comprehensive ROS otherwise negative.  HOME MEDS: Current Outpatient Medications on File Prior to Visit  Medication Sig Dispense Refill  . amLODipine (NORVASC) 10 MG tablet Take 1 tablet (10 mg total) by mouth daily. 90 tablet 1  . calcium-vitamin D (OSCAL 500/200 D-3) 500-200 MG-UNIT tablet Take 1 tablet by mouth 2 (two) times daily. 180 tablet 1  . dorzolamide-timolol (COSOPT) 22.3-6.8 MG/ML ophthalmic solution Place 1 drop into the right eye 2 (two) times daily. 10 mL 1  . levothyroxine (SYNTHROID, LEVOTHROID) 75 MCG tablet Take 1 tablet (75 mcg total) by mouth daily. 90 tablet 1  . metFORMIN (GLUCOPHAGE-XR) 500 MG 24 hr tablet Take 1 tablet (500 mg total) by mouth at bedtime. 90 tablet 1  . metoprolol succinate (TOPROL-XL) 50 MG 24 hr tablet Take 1 tablet (50 mg total) by mouth daily. 90 tablet 1  . triamcinolone ointment (KENALOG) 0.1 % Apply 1 application topically 2 (two) times daily. For rash on thighs and arms. 30 g 0   No current facility-administered medications on file prior to visit.     LABS/IMAGING: No results found for this or any previous visit (from the past 48 hour(s)). No results found.  LIPID PANEL:    Component Value Date/Time   CHOL 180 09/06/2018 0935   TRIG 76.0 09/06/2018 0935   HDL 74.60 09/06/2018 0935   CHOLHDL 2 09/06/2018 0935   VLDL 15.2 09/06/2018 0935   LDLCALC 90 09/06/2018 0935    WEIGHTS: Wt Readings from Last 3 Encounters:  10/11/18 131 lb 12.8 oz (59.8 kg)  09/22/18 128 lb 9.6 oz (58.3 kg)  09/01/18 130 lb (59 kg)    VITALS: BP (!) 156/85   Pulse 63   Ht 5\' 2"  (1.575 m)   Wt 131 lb 12.8 oz (59.8 kg)   BMI 24.11 kg/m   EXAM: Deferred  EKG: Deferred  ASSESSMENT: 1. Murmur - echo shows aortic sclerosis, EF 60-65% (09/2018) 2. Hypertension 3. Type 2 diabetes  PLAN: 1.   Alejandra Davis has aortic sclerosis but  no stenosis on her echo.  EF is normal.  Blood pressure is well controlled at home.  She should continue aggressive risk factor modification including good control of diabetes and blood pressure as well as cholesterol management.  Otherwise I am happy to see her back as needed.  Her PCP could consider follow-up echo in 3 years to assess for any changes in valve architecture or if she becomes symptomatic or has a louder murmur.  Thanks again for the kind referral.  Pixie Casino, MD, FACC, [place] Director of the Advanced Lipid Disorders &  Cardiovascular Risk Reduction Clinic Diplomate of the American Board of Clinical Lipidology Attending Cardiologist  Direct Dial: [phone]  Fax: [phone]  Website:  [email or web address] 10/11/2018, 10:24 AM

## 2018-10-11 NOTE — Patient Instructions (Signed)
Your physician recommends that you schedule a follow-up appointment as needed with Dr. Hilty.  

## 2018-10-13 NOTE — Progress Notes (Addendum)
[facility] Note  10/14/2018     CHIEF COMPLAINT Patient presents for Retina Follow Up   HISTORY OF PRESENT ILLNESS: Alejandra Davis is a 82 y.o. female who presents to the clinic today for:   HPI    Retina Follow Up    Patient presents with  Other.  In both eyes.  This started 8 months ago.  Severity is mild.  Since onset it is stable.  I, the attending physician,  performed the HPI with the patient and updated documentation appropriately.          Comments    F/U DM 2 w/o retinopathy. Patient states she is for cataract sx clearance scheduled for November and December.Pt states she is having occasional blurred VA, denies floaters,flashes and ocular pain. Pt is using CoSopt gtt's OU Bid. BS (unknown),denies Hypo/hyperglycemic episodes.        Last edited by [name], MD on 10/14/2018  1:43 PM. (History)      Lab Results  Component Value Date   HGBA1C 6.5 09/06/2018     Referring physician: Libby Maw, MD [place], [place] [address]  HISTORICAL INFORMATION:   Selected notes from the MEDICAL RECORD NUMBER Referred by [name] for DM exam LEE-  Ocular Hx-  PMH- HTN, thyroid disease, DM (A1C- 6.5)    CURRENT MEDICATIONS: Current Outpatient Medications (Ophthalmic Drugs)  Medication Sig  . dorzolamide-timolol (COSOPT) 22.3-6.8 MG/ML ophthalmic solution Place 1 drop into the right eye 2 (two) times daily.   No current facility-administered medications for this visit.  (Ophthalmic Drugs)   Current Outpatient Medications (Other)  Medication Sig  . amLODipine (NORVASC) 10 MG tablet Take 1 tablet (10 mg total) by mouth daily.  . calcium-vitamin D (OSCAL 500/200 D-3) 500-200 MG-UNIT tablet Take 1 tablet by mouth 2 (two) times daily.  Marland Kitchen levothyroxine (SYNTHROID, LEVOTHROID) 75 MCG tablet Take 1 tablet (75 mcg total) by mouth daily.  . metFORMIN (GLUCOPHAGE-XR) 500 MG 24 hr tablet Take 1 tablet (500 mg  total) by mouth at bedtime.  . metoprolol succinate (TOPROL-XL) 50 MG 24 hr tablet Take 1 tablet (50 mg total) by mouth daily.  Marland Kitchen triamcinolone ointment (KENALOG) 0.1 % Apply 1 application topically 2 (two) times daily. For rash on thighs and arms.   Current Facility-Administered Medications (Other)  Medication Route  . Bevacizumab (AVASTIN) SOLN 1.25 mg Intravitreal      REVIEW OF SYSTEMS: ROS    Positive for: Endocrine, Eyes   Negative for: Constitutional, Gastrointestinal, Neurological, Skin, Genitourinary, Musculoskeletal, HENT, Cardiovascular, Respiratory, Psychiatric, Allergic/Imm, Heme/Lymph   Last edited by Zenovia Jordan, LPN on 63/12/6008  9:32 PM. (History)       ALLERGIES No Known Allergies  PAST MEDICAL HISTORY Past Medical History:  Diagnosis Date  . Chicken pox   . Cystocele   . Diabetes mellitus without complication ([place])   . Hypertension   . Thyroid disease    hypothyroidism   Past Surgical History:  Procedure Laterality Date  . ABDOMINAL HYSTERECTOMY  1990   TAH BSO  . OOPHORECTOMY     BSO  . Vaginal Bx     Papilloma    FAMILY HISTORY Family History  Problem Relation Age of Onset  . Sickle cell anemia Other   . Hypertension Mother   . Cancer Father        LIVER  . Heart disease Brother   . Cancer Brother  STOMACH    SOCIAL HISTORY Social History   Tobacco Use  . Smoking status: Never Smoker  . Smokeless tobacco: Never Used  Substance Use Topics  . Alcohol use: No  . Drug use: No         OPHTHALMIC EXAM:  Base Eye Exam    Visual Acuity (Snellen - Linear)      Right Left   Dist cc 20/60 +2 20/40   Dist ph cc 20/60 +1 20/30 +1       Tonometry (Tonopen, 1:18 PM)      Right Left   Pressure 15 13       Pupils      Dark Light Shape React APD   Right 4 3 Round Brisk None   Left 4 3 Round Brisk None       Visual Fields (Counting fingers)      Left Right    Full Full       Extraocular Movement      Right  Left    Full, Ortho Full, Ortho       Neuro/Psych    Oriented x3:  Yes   Mood/Affect:  Normal       Dilation    Both eyes:  1.0% Mydriacyl, 2.5% Phenylephrine @ 1:18 PM        Slit Lamp and Fundus Exam    Slit Lamp Exam      Right Left   Lids/Lashes Dermatochalasis - upper lid, Meibomian gland dysfunction Dermatochalasis - upper lid, Meibomian gland dysfunction   Conjunctiva/Sclera Melanosis Melanosis   Cornea Arcus, 1+ Punctate epithelial erosions, mild Anterior basement membrane dystrophy Arcus, Inferior 1+ Punctate epithelial erosions   Anterior Chamber Moderate depth, narrow angle Moderate depth   Iris Round and dilated, No NVI Round and dilated, No NVI   Lens 2-3+ Nuclear sclerosis, 3-4+ Cortical cataract 3+ Nuclear sclerosis, 3-4+ Cortical cataract   Vitreous Vitreous syneresis Vitreous syneresis       Fundus Exam      Right Left   Disc Sharp rim, inferior notch, 1+ pallor, inf. Rim thinning Normal   C/D Ratio 0.7 0.6   Macula Blunted foveal reflex, Microaneurysms superior to fovea, Epiretinal membrane , positive edema, dense IRH's superior macula Blunted foveal reflex, flat, RPE mottling, Epiretinal membrane, No heme or edema   Vessels Mild Vascular attenuation, copper wiring, dilated superior veins, positive A/V Crossing changes. Superior BVO OD. Vascular attenuation, AV crossing changes   Periphery Attached, Temporal Lattice degeneration, scattered IRH superior hemisphere.  Attached, scattered Reticular degeneration          IMAGING AND PROCEDURES  Imaging and Procedures for 03/07/18  OCT, Retina - OU - Both Eyes       Right Eye Quality was good. Central Foveal Thickness: 352. Progression has worsened. Findings include abnormal foveal contour, no SRF, epiretinal membrane, intraretinal fluid, intraretinal hyper-reflective material (New onset IRF and IRHM in superior macula. Consistent with BRVO. ).   Left Eye Quality was good. Central Foveal Thickness: 258.  Progression has been stable. Findings include abnormal foveal contour, epiretinal membrane, no IRF, no SRF (Blunted foveal reflex).   Notes *Images captured and stored on drive  Diagnosis / Impression:  ERM OU OD:New onset IRF and IRHM in superior macula with CME. Consistent with BRVO.  OS: very mild ERM with blunted foveal reflex  Clinical management:  See below  Abbreviations: NFP - Normal foveal profile. CME - cystoid macular edema. PED - pigment epithelial detachment. IRF -  intraretinal fluid. SRF - subretinal fluid. EZ - ellipsoid zone. ERM - epiretinal membrane. ORA - outer retinal atrophy. ORT - outer retinal tubulation. SRHM - subretinal hyper-reflective material         Intravitreal Injection, Pharmacologic Agent - OD - Right Eye       Time Out 10/14/2018. 2:06 PM. Confirmed correct patient, procedure, site, and patient consented.   Anesthesia Topical anesthesia was used. Anesthetic medications included Lidocaine 2%, Tetracaine 0.5%.   Procedure Preparation included 5% betadine to ocular surface. A 30 gauge needle was used.   Injection:  1.25 mg Bevacizumab 1.25mg /0.27ml   NDC: 50242-060-01, Lot: [id number] , Expiration date: 12/08/2018   Route: Intravitreal, Site: Right Eye, Waste: 0 mL  Post-op Post injection exam found visual acuity of at least counting fingers. The patient tolerated the procedure well. There were no complications. The patient received written and verbal post procedure care education.                 ASSESSMENT/PLAN:    ICD-10-CM   1. Branch retinal vein occlusion of right eye with macular edema H34.8310 Intravitreal Injection, Pharmacologic Agent - OD - Right Eye    Bevacizumab (AVASTIN) SOLN 1.25 mg  2. Diabetes mellitus type 2 without retinopathy ([place]) E11.9   3. Epiretinal membrane (ERM) of both eyes H35.373   4. Retinal edema H35.81 OCT, Retina - OU - Both Eyes  5. Combined forms of age-related cataract of both eyes H25.813    6. Ocular hypertension of right eye H40.051   7. Glaucoma suspect of right eye H40.001    1. BRVO with CME OD - The natural history of retinal vein occlusion and macular edema and treatment options including observation, laser photocoagulation, and intravitreal antiVEGF injection with Avastin and Lucentis and Eylea and intravitreal injection of steroids with triamcinolone and Ozurdex and the complications of these procedures including loss of vision, infection, cataract, glaucoma, and retinal detachment were discussed with patient. - Specifically discussed findings from [place] / [place] study regarding patient stabilization with anti-VEGF agents and increased potential for visual improvements.  Also discussed need for frequent follow up and potentially multiple injections given the chronic nature of the disease process - BCVA 20/60 OD -- down from 20/30 - OCT shows CME superior macula - recommend IVA OD #1 today, 10.18.19 - RBA of procedure discussed, questions answered - informed consent obtained and signed - see procedure note - F/U 4 weeks -- DFE/OCT/possible injection  2. Diabetes mellitus, type 2 without retinopathy - The incidence, risk factors for progression, natural history and treatment options for diabetic retinopathy  were discussed with patient.   - The need for close monitoring of blood glucose, blood pressure, and serum lipids, avoiding cigarette or any type of tobacco, and the need for long term follow up was also discussed with patient. - f/u in 1 year  3,4. Epiretinal membrane, OU- The natural history, anatomy, potential for loss of vision, and treatment options including vitrectomy techniques and the complications of endophthalmitis, retinal detachment, vitreous hemorrhage, cataract progression and permanent vision loss discussed with the patient. - relatively mild ERM OU -- blunted central foveal depression - OD with mild cystic changes 2/2 BRVO as above; OS without cystic  changes or edema - discussed findings and prognosis - recommend monitoring for now -- biggest issue is cataracts OU - F/U 3-4 months  5. Combined form age-related cataract OU-  - The symptoms of cataract, surgical options, and treatments and risks were discussed with patient. -  discussed diagnosis and progression - visually significant -- reports significant glare symptoms and trouble driving at night - clear from a retina standpoint to proceed with cataract surgery OS -- OD will be cleared once BRVO w/ CME a little more stable - recommend timing anti-VEGF injection 1-2 wks prior to cataract surgery OD  6,7. Ocular hypertension / Glaucoma suspect OD>OS-  - IOP ~25 OD, 20 OS at both prior clinic visits - today IOP improved to 15 and 13 on cosopt BID OU - denies any family hx of glaucoma - under the expert care of Dr. [name] who is actively managing with possible goniotomy - cont cosopt BID OU  Ophthalmic Meds Ordered this visit:  Meds ordered this encounter  Medications  . Bevacizumab (AVASTIN) SOLN 1.25 mg       Return in about 4 weeks (around 11/11/2018) for F/u BRVO OD: DFE, OCT.  There are no Patient Instructions on file for this visit.   Explained the diagnoses, plan, and follow up with the patient and they expressed understanding.  Patient expressed understanding of the importance of proper follow up care.   This document serves as a record of services personally performed by Gardiner Sleeper, MD, PhD. It was created on their behalf by Ernest Mallick, OA, an ophthalmic assistant. The creation of this record is the provider's dictation and/or activities during the visit.    Electronically signed by: Ernest Mallick, OA  10.17.19 11:36 PM    Gardiner Sleeper, M.D., Ph.D. Diseases & Surgery of the Retina and Vitreous [facility]   I have reviewed the above documentation for accuracy and completeness, and I agree with the above. Gardiner Sleeper, M.D.,  Ph.D. 10/16/18 11:36 PM    Abbreviations: M myopia (nearsighted); A astigmatism; H hyperopia (farsighted); P presbyopia; Mrx spectacle prescription;  CTL contact lenses; OD right eye; OS left eye; OU both eyes  XT exotropia; ET esotropia; PEK punctate epithelial keratitis; PEE punctate epithelial erosions; DES dry eye syndrome; MGD meibomian gland dysfunction; ATs artificial tears; PFAT's preservative free artificial tears; [place] nuclear sclerotic cataract; PSC posterior subcapsular cataract; ERM epi-retinal membrane; PVD posterior vitreous detachment; RD retinal detachment; DM diabetes mellitus; DR diabetic retinopathy; NPDR non-proliferative diabetic retinopathy; PDR proliferative diabetic retinopathy; CSME clinically significant macular edema; DME diabetic macular edema; dbh dot blot hemorrhages; CWS cotton wool spot; POAG primary open angle glaucoma; C/D cup-to-disc ratio; HVF humphrey visual field; GVF goldmann visual field; OCT optical coherence tomography; IOP intraocular pressure; BRVO Branch retinal vein occlusion; CRVO central retinal vein occlusion; CRAO central retinal artery occlusion; BRAO branch retinal artery occlusion; RT retinal tear; SB scleral buckle; PPV pars plana vitrectomy; VH Vitreous hemorrhage; PRP panretinal laser photocoagulation; IVK intravitreal kenalog; VMT vitreomacular traction; MH Macular hole;  NVD neovascularization of the disc; NVE neovascularization elsewhere; AREDS age related eye disease study; ARMD age related macular degeneration; POAG primary open angle glaucoma; EBMD epithelial/anterior basement membrane dystrophy; ACIOL anterior chamber intraocular lens; IOL intraocular lens; PCIOL posterior chamber intraocular lens; Phaco/IOL phacoemulsification with intraocular lens placement; [place] photorefractive keratectomy; LASIK laser assisted in situ keratomileusis; HTN hypertension; DM diabetes mellitus; COPD chronic obstructive pulmonary disease

## 2018-10-14 ENCOUNTER — Encounter (INDEPENDENT_AMBULATORY_CARE_PROVIDER_SITE_OTHER): Payer: Self-pay | Admitting: Ophthalmology

## 2018-10-14 ENCOUNTER — Ambulatory Visit (INDEPENDENT_AMBULATORY_CARE_PROVIDER_SITE_OTHER): Payer: Medicare Other | Admitting: Ophthalmology

## 2018-10-14 DIAGNOSIS — E119 Type 2 diabetes mellitus without complications: Secondary | ICD-10-CM

## 2018-10-14 DIAGNOSIS — H25813 Combined forms of age-related cataract, bilateral: Secondary | ICD-10-CM

## 2018-10-14 DIAGNOSIS — H35373 Puckering of macula, bilateral: Secondary | ICD-10-CM

## 2018-10-14 DIAGNOSIS — H40001 Preglaucoma, unspecified, right eye: Secondary | ICD-10-CM

## 2018-10-14 DIAGNOSIS — H34831 Tributary (branch) retinal vein occlusion, right eye, with macular edema: Secondary | ICD-10-CM | POA: Diagnosis not present

## 2018-10-14 DIAGNOSIS — H3581 Retinal edema: Secondary | ICD-10-CM | POA: Diagnosis not present

## 2018-10-14 DIAGNOSIS — H40051 Ocular hypertension, right eye: Secondary | ICD-10-CM

## 2018-10-16 DIAGNOSIS — H34831 Tributary (branch) retinal vein occlusion, right eye, with macular edema: Secondary | ICD-10-CM | POA: Diagnosis not present

## 2018-10-16 MED ORDER — BEVACIZUMAB CHEMO INJECTION 1.25MG/0.05ML SYRINGE FOR KALEIDOSCOPE
1.2500 mg | INTRAVITREAL | Status: DC
  Administered 2018-10-16: 1.25 mg via INTRAVITREAL

## 2018-10-31 ENCOUNTER — Encounter (INDEPENDENT_AMBULATORY_CARE_PROVIDER_SITE_OTHER): Payer: Self-pay | Admitting: Ophthalmology

## 2018-10-31 ENCOUNTER — Encounter: Payer: Self-pay | Admitting: Family Medicine

## 2018-11-01 ENCOUNTER — Encounter (INDEPENDENT_AMBULATORY_CARE_PROVIDER_SITE_OTHER): Payer: Self-pay | Admitting: Ophthalmology

## 2018-11-03 ENCOUNTER — Telehealth: Payer: Self-pay | Admitting: Family Medicine

## 2018-11-03 NOTE — Telephone Encounter (Signed)
Alejandra Davis came by and dropped off DMV forms for patient. Please call Robbie Nangle at [phone] and she will have Alejandra Davis to come and to pick forms up once they are ready.

## 2018-11-03 NOTE — Telephone Encounter (Signed)
Copied from [place] [id number]. Topic: Quick Communication - See Telephone Encounter >> Nov 03, 2018  4:05 PM Blase Mess A wrote: CRM for notification. See Telephone encounter for: 11/03/18.  Patient's niece is calling to drop off a document from the [facility] She will will drop it off.  The patient was last in the office 09/01/18.

## 2018-11-03 NOTE — Telephone Encounter (Signed)
Noted  

## 2018-11-04 NOTE — Telephone Encounter (Signed)
PCP portion completed & placed on provider's desk to be signed. Will notify Sherlyn Hay once paperwork is signed.

## 2018-11-07 ENCOUNTER — Encounter (INDEPENDENT_AMBULATORY_CARE_PROVIDER_SITE_OTHER): Payer: Self-pay | Admitting: Ophthalmology

## 2018-11-08 NOTE — Telephone Encounter (Signed)
Alejandra Davis is aware that papers are ready and up front for pick up.

## 2018-11-10 ENCOUNTER — Encounter (INDEPENDENT_AMBULATORY_CARE_PROVIDER_SITE_OTHER): Payer: Self-pay | Admitting: Ophthalmology

## 2018-11-14 NOTE — Progress Notes (Addendum)
[facility] Note  11/15/2018     CHIEF COMPLAINT Patient presents for Retina Follow Up   HISTORY OF PRESENT ILLNESS: Alejandra Davis is a 82 y.o. female who presents to the clinic today for:   HPI    Retina Follow Up    Patient presents with  CRVO/BRVO.  In right eye.  This started 4 months ago.  Severity is mild.  Since onset it is stable.  I, the attending physician,  performed the HPI with the patient and updated documentation appropriately.          Comments    F/UBRVO OD. Patient states her vision is "about the same", denies new visual onsets/issues. Patient is using Cosopt gtt's as directed. Pt is ready for [place] today if indicted.        Last edited by [name], MD on 11/15/2018  8:39 AM. (History)    Underwent cataract surgery OS with Dr. [name] last Wednesday 11.13.19. States taking blue and yellow cap drops.    Lab Results  Component Value Date   HGBA1C 6.5 09/06/2018     Referring physician: Libby Maw, MD [place], [place] [address]  HISTORICAL INFORMATION:   Selected notes from the MEDICAL RECORD NUMBER Referred by [name] for DM exam LEE-  Ocular Hx-  PMH- HTN, thyroid disease, DM (A1C- 6.5)    CURRENT MEDICATIONS: Current Outpatient Medications (Ophthalmic Drugs)  Medication Sig  . dorzolamide-timolol (COSOPT) 22.3-6.8 MG/ML ophthalmic solution Place 1 drop into the right eye 2 (two) times daily.   No current facility-administered medications for this visit.  (Ophthalmic Drugs)   Current Outpatient Medications (Other)  Medication Sig  . amLODipine (NORVASC) 10 MG tablet Take 1 tablet (10 mg total) by mouth daily.  . calcium-vitamin D (OSCAL 500/200 D-3) 500-200 MG-UNIT tablet Take 1 tablet by mouth 2 (two) times daily.  Marland Kitchen levothyroxine (SYNTHROID, LEVOTHROID) 75 MCG tablet Take 1 tablet (75 mcg total) by mouth daily.  . metFORMIN (GLUCOPHAGE-XR) 500 MG 24 hr tablet Take 1  tablet (500 mg total) by mouth at bedtime.  . metoprolol succinate (TOPROL-XL) 50 MG 24 hr tablet Take 1 tablet (50 mg total) by mouth daily.  Marland Kitchen triamcinolone ointment (KENALOG) 0.1 % Apply 1 application topically 2 (two) times daily. For rash on thighs and arms.   Current Facility-Administered Medications (Other)  Medication Route  . Bevacizumab (AVASTIN) SOLN 1.25 mg Intravitreal  . Bevacizumab (AVASTIN) SOLN 1.25 mg Intravitreal      REVIEW OF SYSTEMS: ROS    Positive for: Eyes   Negative for: Constitutional, Gastrointestinal, Neurological, Skin, Genitourinary, Musculoskeletal, HENT, Endocrine, Cardiovascular, Respiratory, Psychiatric, Allergic/Imm, Heme/Lymph   Last edited by Zenovia Jordan, LPN on 74/25/9563  8:75 AM. (History)       ALLERGIES No Known Allergies  PAST MEDICAL HISTORY Past Medical History:  Diagnosis Date  . Chicken pox   . Cystocele   . Diabetes mellitus without complication ([place])   . Hypertension   . Thyroid disease    hypothyroidism   Past Surgical History:  Procedure Laterality Date  . ABDOMINAL HYSTERECTOMY  1990   TAH BSO  . OOPHORECTOMY     BSO  . Vaginal Bx     Papilloma    FAMILY HISTORY Family History  Problem Relation Age of Onset  . Sickle cell anemia Other   . Hypertension Mother   . Cancer Father        LIVER  .  Heart disease Brother   . Cancer Brother        STOMACH    SOCIAL HISTORY Social History   Tobacco Use  . Smoking status: Never Smoker  . Smokeless tobacco: Never Used  Substance Use Topics  . Alcohol use: No  . Drug use: No         OPHTHALMIC EXAM:  Base Eye Exam    Visual Acuity (Snellen - Linear)      Right Left   Dist cc 20/50 20/40   Dist ph cc NI NI   Correction:  Glasses       Tonometry (Tonopen, 8:10 AM)      Right Left   Pressure 16 15       Pupils      Dark Light Shape React APD   Right 3 2 Round Brisk None   Left 3 2 Round Brisk None       Visual Fields (Counting fingers)       Left Right    Full Full       Extraocular Movement      Right Left    Full, Ortho Full, Ortho       Neuro/Psych    Oriented x3:  Yes   Mood/Affect:  Normal       Dilation    Both eyes:  1.0% Mydriacyl, 2.5% Phenylephrine @ 8:09 AM        Slit Lamp and Fundus Exam    Slit Lamp Exam      Right Left   Lids/Lashes Dermatochalasis - upper lid, Meibomian gland dysfunction Dermatochalasis - upper lid, Meibomian gland dysfunction   Conjunctiva/Sclera Melanosis Melanosis   Cornea Arcus, 1+ Punctate epithelial erosions, mild Anterior basement membrane dystrophy Arcus, Inferior 1+ Punctate epithelial erosions; 2-3+ microcystic edema temporal with 2+ descemet folds   Anterior Chamber Moderate depth, narrow angle Deep; 3+ cell/pigment   Iris Round and dilated, No NVI Round and dilated, No NVI   Lens 2-3+ Nuclear sclerosis, 3-4+ Cortical cataract Posterior chamber intraocular lens in good position   Vitreous Vitreous syneresis Vitreous syneresis       Fundus Exam      Right Left   Disc Sharp rim, inferior notch, 1+ pallor, inf. Rim thinning Normal   C/D Ratio 0.7 0.6   Macula Blunted foveal reflex, Microaneurysms superior to fovea, Epiretinal membrane, edema improved, IRHs superior macula improving Blunted foveal reflex, flat, RPE mottling, Epiretinal membrane, No heme or edema   Vessels Mild Vascular attenuation, copper wiring, dilated superior veins, positive A/V Crossing changes. Superior BVO OD. Vascular attenuation, AV crossing changes   Periphery Attached, Temporal Lattice degeneration, scattered IRH superior hemisphere.  Attached, scattered Reticular degeneration          IMAGING AND PROCEDURES  Imaging and Procedures for 03/07/18  OCT, Retina - OU - Both Eyes       Right Eye Quality was good. Central Foveal Thickness: 267. Progression has improved. Findings include abnormal foveal contour, no SRF, epiretinal membrane, intraretinal fluid (Interval improvement in IRF  and IRHM in superior macula. ).   Left Eye Quality was good. Central Foveal Thickness: 265. Progression has been stable. Findings include abnormal foveal contour, epiretinal membrane, no IRF, no SRF, vitreomacular adhesion  (Blunted foveal reflex).   Notes *Images captured and stored on drive  Diagnosis / Impression:  ERM OU OD: Interval improvement IRF and IRHM in superior macula--now trace cystic changes OS: very mild ERM with blunted foveal reflex -- stable; mild  VMA  Clinical management:  See below  Abbreviations: NFP - Normal foveal profile. CME - cystoid macular edema. PED - pigment epithelial detachment. IRF - intraretinal fluid. SRF - subretinal fluid. EZ - ellipsoid zone. ERM - epiretinal membrane. ORA - outer retinal atrophy. ORT - outer retinal tubulation. SRHM - subretinal hyper-reflective material         Intravitreal Injection, Pharmacologic Agent - OD - Right Eye       Time Out 11/15/2018. 9:00 AM. Confirmed correct patient, procedure, site, and patient consented.   Anesthesia Topical anesthesia was used. Anesthetic medications included Lidocaine 2%, Proparacaine 0.5%.   Procedure Preparation included 5% betadine to ocular surface, eyelid speculum. A supplied needle was used.   Injection:  1.25 mg Bevacizumab 1.25mg /0.46ml   NDC: 50242-060-01, Lot: [id number] , Expiration date: 12/08/2018   Route: Intravitreal, Site: Right Eye, Waste: 0 mg  Post-op Post injection exam found visual acuity of at least counting fingers. The patient tolerated the procedure well. There were no complications. The patient received written and verbal post procedure care education.                 ASSESSMENT/PLAN:    ICD-10-CM   1. Branch retinal vein occlusion of right eye with macular edema H34.8310 Intravitreal Injection, Pharmacologic Agent - OD - Right Eye    Bevacizumab (AVASTIN) SOLN 1.25 mg  2. Diabetes mellitus type 2 without retinopathy ([place]) E11.9   3.  Epiretinal membrane (ERM) of both eyes H35.373   4. Retinal edema H35.81 OCT, Retina - OU - Both Eyes  5. Combined forms of age-related cataract of both eyes H25.813   6. Ocular hypertension of right eye H40.051   7. Glaucoma suspect of right eye H40.001    1. BRVO with CME OD - at presentation, BCVA 20/60 OD -- down from 20/30 - initial OCT w/ CME superior macula - s/p IVA OD #1 (10.18.19) - today, BCVA 20/50 OD and OCT w/ interval improvement in IRF - recommend IVA OD #2 today, 11.19.19 - RBA of procedure discussed, questions answered - informed consent obtained and signed - see procedure note - F/U 4 weeks -- DFE/OCT/possible injection  2. Diabetes mellitus, type 2 without retinopathy - The incidence, risk factors for progression, natural history and treatment options for diabetic retinopathy  were discussed with patient.   - The need for close monitoring of blood glucose, blood pressure, and serum lipids, avoiding cigarette or any type of tobacco, and the need for long term follow up was also discussed with patient. - f/u in 1 year  3,4. Epiretinal membrane, OU- The natural history, anatomy, potential for loss of vision, and treatment options including vitrectomy techniques and the complications of endophthalmitis, retinal detachment, vitreous hemorrhage, cataract progression and permanent vision loss discussed with the patient. - relatively mild ERM OU -- blunted central foveal depression - OD with mild cystic changes 2/2 BRVO as above; OS without cystic changes or edema - discussed findings and prognosis - recommend monitoring for now  5. Pseudophakia OS  - s/p CE/IOL 11.13.19 w/ Dr. 11.26.19  - beautiful surgery -- IOL in perfect position  - mild corneal edema and 3+ cell OS  - continue post op drops per [name]   6. Combined form age-related cataract OD-  - The symptoms of cataract, surgical options, and treatments and risks were discussed with patient. - discussed diagnosis  and progression - visually significant -- reports significant glare symptoms and trouble driving at night - clear from a  retina standpoint to proceed with cataract surgery OD - recommend timing anti-VEGF injection 1-2 wks prior to cataract surgery OD  6,7. Ocular hypertension / Glaucoma suspect OD>OS-  - IOP ~25 OD, 20 OS at both prior clinic visits - today IOP improved to 16 and 15 on cosopt BID OU - denies any family hx of glaucoma - under the expert care of Dr. [name] who is actively managing with possible goniotomy - cont cosopt BID OU  Ophthalmic Meds Ordered this visit:  Meds ordered this encounter  Medications  . Bevacizumab (AVASTIN) SOLN 1.25 mg       Return in about 4 weeks (around 12/13/2018) for f/u BRVO OD -- Dilated Exam, OCT, Possible Injxn -.  There are no Patient Instructions on file for this visit.   Explained the diagnoses, plan, and follow up with the patient and they expressed understanding.  Patient expressed understanding of the importance of proper follow up care.   This document serves as a record of services personally performed by Gardiner Sleeper, MD, PhD. It was created on their behalf by Ernest Mallick, OA, an ophthalmic assistant. The creation of this record is the provider's dictation and/or activities during the visit.    Electronically signed by: Ernest Mallick, OA  11.18.19 8:30 AM    Gardiner Sleeper, M.D., Ph.D. Diseases & Surgery of the Retina and Vitreous [facility]   I have reviewed the above documentation for accuracy and completeness, and I agree with the above. Gardiner Sleeper, M.D., Ph.D. 11/16/18 8:34 AM     Abbreviations: M myopia (nearsighted); A astigmatism; H hyperopia (farsighted); P presbyopia; Mrx spectacle prescription;  CTL contact lenses; OD right eye; OS left eye; OU both eyes  XT exotropia; ET esotropia; PEK punctate epithelial keratitis; PEE punctate epithelial erosions; DES dry eye syndrome; MGD  meibomian gland dysfunction; ATs artificial tears; PFAT's preservative free artificial tears; [place] nuclear sclerotic cataract; PSC posterior subcapsular cataract; ERM epi-retinal membrane; PVD posterior vitreous detachment; RD retinal detachment; DM diabetes mellitus; DR diabetic retinopathy; NPDR non-proliferative diabetic retinopathy; PDR proliferative diabetic retinopathy; CSME clinically significant macular edema; DME diabetic macular edema; dbh dot blot hemorrhages; CWS cotton wool spot; POAG primary open angle glaucoma; C/D cup-to-disc ratio; HVF humphrey visual field; GVF goldmann visual field; OCT optical coherence tomography; IOP intraocular pressure; BRVO Branch retinal vein occlusion; CRVO central retinal vein occlusion; CRAO central retinal artery occlusion; BRAO branch retinal artery occlusion; RT retinal tear; SB scleral buckle; PPV pars plana vitrectomy; VH Vitreous hemorrhage; PRP panretinal laser photocoagulation; IVK intravitreal kenalog; VMT vitreomacular traction; MH Macular hole;  NVD neovascularization of the disc; NVE neovascularization elsewhere; AREDS age related eye disease study; ARMD age related macular degeneration; POAG primary open angle glaucoma; EBMD epithelial/anterior basement membrane dystrophy; ACIOL anterior chamber intraocular lens; IOL intraocular lens; PCIOL posterior chamber intraocular lens; Phaco/IOL phacoemulsification with intraocular lens placement; [place] photorefractive keratectomy; LASIK laser assisted in situ keratomileusis; HTN hypertension; DM diabetes mellitus; COPD chronic obstructive pulmonary disease

## 2018-11-15 ENCOUNTER — Ambulatory Visit (INDEPENDENT_AMBULATORY_CARE_PROVIDER_SITE_OTHER): Payer: Medicare Other | Admitting: Ophthalmology

## 2018-11-15 ENCOUNTER — Encounter (INDEPENDENT_AMBULATORY_CARE_PROVIDER_SITE_OTHER): Payer: Self-pay | Admitting: Ophthalmology

## 2018-11-15 DIAGNOSIS — H34831 Tributary (branch) retinal vein occlusion, right eye, with macular edema: Secondary | ICD-10-CM

## 2018-11-15 DIAGNOSIS — H40001 Preglaucoma, unspecified, right eye: Secondary | ICD-10-CM

## 2018-11-15 DIAGNOSIS — H35373 Puckering of macula, bilateral: Secondary | ICD-10-CM

## 2018-11-15 DIAGNOSIS — E119 Type 2 diabetes mellitus without complications: Secondary | ICD-10-CM

## 2018-11-15 DIAGNOSIS — H25813 Combined forms of age-related cataract, bilateral: Secondary | ICD-10-CM

## 2018-11-15 DIAGNOSIS — H40051 Ocular hypertension, right eye: Secondary | ICD-10-CM

## 2018-11-15 DIAGNOSIS — H3581 Retinal edema: Secondary | ICD-10-CM | POA: Diagnosis not present

## 2018-11-15 MED ORDER — BEVACIZUMAB CHEMO INJECTION 1.25MG/0.05ML SYRINGE FOR KALEIDOSCOPE
1.2500 mg | INTRAVITREAL | Status: DC
  Administered 2018-11-15: 1.25 mg via INTRAVITREAL

## 2018-11-16 ENCOUNTER — Encounter (INDEPENDENT_AMBULATORY_CARE_PROVIDER_SITE_OTHER): Payer: Self-pay | Admitting: Ophthalmology

## 2018-12-12 NOTE — Progress Notes (Signed)
[facility] Note  12/13/2018     CHIEF COMPLAINT Patient presents for Retina Follow Up   HISTORY OF PRESENT ILLNESS: Alejandra Davis is a 82 y.o. female who presents to the clinic today for:   HPI    Retina Follow Up    Patient presents with  Other.  In right eye.  Severity is moderate.  Duration of 4 weeks.  Since onset it is stable.  I, the attending physician,  performed the HPI with the patient and updated documentation appropriately.          Comments    4 week Retina follow up for Brvo/Cme OD, Patient states vision is about the same.        Last edited by [name], MD on 12/13/2018  3:08 PM. (History)        Lab Results  Component Value Date   HGBA1C 6.5 09/06/2018   Pt doing well.  Has had cat sx OS, and having cat sx OD in the near future.  Doing well with injections.   Referring physician: Libby Maw, MD [place], [place] [address]  HISTORICAL INFORMATION:   Selected notes from the MEDICAL RECORD NUMBER Referred by [name] for DM exam LEE-  Ocular Hx-  PMH- HTN, thyroid disease, DM (A1C- 6.5)    CURRENT MEDICATIONS: Current Outpatient Medications (Ophthalmic Drugs)  Medication Sig  . dorzolamide-timolol (COSOPT) 22.3-6.8 MG/ML ophthalmic solution Place 1 drop into the right eye 2 (two) times daily.   No current facility-administered medications for this visit.  (Ophthalmic Drugs)   Current Outpatient Medications (Other)  Medication Sig  . amLODipine (NORVASC) 10 MG tablet Take 1 tablet (10 mg total) by mouth daily.  . calcium-vitamin D (OSCAL 500/200 D-3) 500-200 MG-UNIT tablet Take 1 tablet by mouth 2 (two) times daily.  Marland Kitchen levothyroxine (SYNTHROID, LEVOTHROID) 75 MCG tablet Take 1 tablet (75 mcg total) by mouth daily.  . metFORMIN (GLUCOPHAGE-XR) 500 MG 24 hr tablet Take 1 tablet (500 mg total) by mouth at bedtime.  . metoprolol succinate (TOPROL-XL) 50 MG 24 hr tablet Take 1  tablet (50 mg total) by mouth daily.  Marland Kitchen triamcinolone ointment (KENALOG) 0.1 % Apply 1 application topically 2 (two) times daily. For rash on thighs and arms.   Current Facility-Administered Medications (Other)  Medication Route  . Bevacizumab (AVASTIN) SOLN 1.25 mg Intravitreal  . Bevacizumab (AVASTIN) SOLN 1.25 mg Intravitreal  . Bevacizumab (AVASTIN) SOLN 1.25 mg Intravitreal      REVIEW OF SYSTEMS: ROS    Positive for: Eyes   Negative for: Constitutional, Gastrointestinal, Neurological, Skin, Genitourinary, Musculoskeletal, HENT, Endocrine, Cardiovascular, Respiratory, Psychiatric, Allergic/Imm, Heme/Lymph   Last edited by Elmore Guise on 12/13/2018  2:42 PM. (History)       ALLERGIES No Known Allergies  PAST MEDICAL HISTORY Past Medical History:  Diagnosis Date  . Chicken pox   . Cystocele   . Diabetes mellitus without complication ([place])   . Hypertension   . Thyroid disease    hypothyroidism   Past Surgical History:  Procedure Laterality Date  . ABDOMINAL HYSTERECTOMY  1990   TAH BSO  . OOPHORECTOMY     BSO  . Vaginal Bx     Papilloma    FAMILY HISTORY Family History  Problem Relation Age of Onset  . Sickle cell anemia Other   . Hypertension Mother   . Cancer Father        LIVER  .  Heart disease Brother   . Cancer Brother        STOMACH    SOCIAL HISTORY Social History   Tobacco Use  . Smoking status: Never Smoker  . Smokeless tobacco: Never Used  Substance Use Topics  . Alcohol use: No  . Drug use: No         OPHTHALMIC EXAM:  Base Eye Exam    Visual Acuity (Snellen - Linear)      Right Left   Dist cc 20/50 20/40   Dist ph cc 20/40-3 20/25-2       Tonometry (Tonopen, 2:45 PM)      Right Left   Pressure 21 14       Pupils      Dark Light Shape React APD   Right 3 2 Round Brisk None   Left 3 2 Round Brisk None       Visual Fields (Counting fingers)      Left Right    Full Full       Extraocular Movement      Right  Left    Full, Ortho Full, Ortho       Neuro/Psych    Oriented x3:  Yes   Mood/Affect:  Normal       Dilation    Both eyes:  1.0% Mydriacyl, 2.5% Phenylephrine @ 2:45 PM        Slit Lamp and Fundus Exam    Slit Lamp Exam      Right Left   Lids/Lashes Dermatochalasis - upper lid, Meibomian gland dysfunction Dermatochalasis - upper lid, Meibomian gland dysfunction   Conjunctiva/Sclera Melanosis Melanosis   Cornea Arcus, 2+ Punctate epithelial erosions, mild Anterior basement membrane dystrophy Arcus, Inferior 1+ Punctate epithelial erosions; 2-3+ microcystic edema temporal with 2+ descemet folds, Keratic precipitates.  Well healed temporal cat sx incision.   Anterior Chamber Moderate depth, narrow angle Deep; 1/2+ pigment; no cell   Iris Round and dilated, No NVI Round and dilated, No NVI   Lens 2-3+ Nuclear sclerosis, 3-4+ Cortical cataract Posterior chamber intraocular lens in good position.  1+ PCO.   Vitreous Vitreous syneresis Vitreous syneresis       Fundus Exam      Right Left   Disc Sharp rim, inferior notch, 1+ pallor, inf. Rim thinning Mild pallor.   C/D Ratio 0.7 0.6   Macula Blunted foveal reflex, Microaneurysms superior to fovea, Epiretinal membrane, edema improved, IRHs superior macula improving Blunted foveal reflex, flat, RPE mottling, Epiretinal membrane, No heme or edema   Vessels Severe vascular attenuation superiorly, copper wiring, dilated superior veins, positive A/V Crossing changes. Superior BVO OD. Mild vascular attenuation, AV crossing changes   Periphery Attached, Temporal Lattice degeneration, scattered IRH superior hemisphere.  Scattered MAs and DBHs superiorly. Attached, scattered Reticular degeneration        Refraction    Wearing Rx      Sphere Cylinder Axis Add   Right -0.75 +1.25 170 +2.50   Left -0.75 +1.00 003 +2.50   Type:  PAL          IMAGING AND PROCEDURES  Imaging and Procedures for 03/07/18  OCT, Retina - OU - Both Eyes        Right Eye Quality was good. Central Foveal Thickness: 266. Progression has been stable. Findings include abnormal foveal contour, no SRF, epiretinal membrane, intraretinal fluid (Interval improvement in IRF and IRHM in superior macula. ).   Left Eye Quality was good. Central Foveal Thickness: 259. Progression  has been stable. Findings include abnormal foveal contour, epiretinal membrane, no IRF, no SRF, vitreomacular adhesion  (Blunted foveal reflex).   Notes *Images captured and stored on drive  Diagnosis / Impression:  ERM OU OD: Stable interval improvement in IRF and IRHM in superior macula (BRVO)--stable, slightly improved trace cystic changes. OS: very mild ERM with blunted foveal reflex -- stable; mild VMA  Clinical management:  See below  Abbreviations: NFP - Normal foveal profile. CME - cystoid macular edema. PED - pigment epithelial detachment. IRF - intraretinal fluid. SRF - subretinal fluid. EZ - ellipsoid zone. ERM - epiretinal membrane. ORA - outer retinal atrophy. ORT - outer retinal tubulation. SRHM - subretinal hyper-reflective material         Intravitreal Injection, Pharmacologic Agent - OD - Right Eye       Time Out 12/13/2018. 3:42 PM. Confirmed correct patient, procedure, site, and patient consented.   Anesthesia Topical anesthesia was used. Anesthetic medications included Lidocaine 2%, Proparacaine 0.5%.   Procedure Preparation included 5% betadine to ocular surface, eyelid speculum. A supplied needle was used.   Injection:  1.25 mg Bevacizumab (AVASTIN) SOLN   NDC: 73419-379-02, Lot: [id number] , Expiration date: 10/27/2019   Route: Intravitreal, Site: Right Eye, Waste: 0 mL  Post-op Post injection exam found visual acuity of at least counting fingers. The patient tolerated the procedure well. There were no complications. The patient received written and verbal post procedure care education.                 ASSESSMENT/PLAN:     ICD-10-CM   1. Branch retinal vein occlusion of right eye with macular edema H34.8310 Intravitreal Injection, Pharmacologic Agent - OD - Right Eye    Bevacizumab (AVASTIN) SOLN 1.25 mg  2. Diabetes mellitus type 2 without retinopathy ([place]) E11.9   3. Epiretinal membrane (ERM) of both eyes H35.373   4. Retinal edema H35.81 OCT, Retina - OU - Both Eyes  5. Combined forms of age-related cataract of both eyes H25.813   6. Ocular hypertension of right eye H40.051   7. Glaucoma suspect of right eye H40.001    1. BRVO with CME OD - at presentation, BCVA 20/60 OD -- down from 20/30 - initial OCT w/ CME superior macula - s/p IVA OD #1 (10.18.19), #2 (11.19.19) - good response to medications - today, BCVA 20/40-3 OD and OCT w/ stable improvement in IRF - recommend IVA OD #3 today, 12.17.19, with extension of interval to 5-6 wks - RBA of procedure discussed, questions answered - informed consent obtained and signed - see procedure note - F/U 5-6 weeks -- DFE/OCT/possible injection  2. Diabetes mellitus, type 2 without retinopathy - The incidence, risk factors for progression, natural history and treatment options for diabetic retinopathy  were discussed with patient.   - The need for close monitoring of blood glucose, blood pressure, and serum lipids, avoiding cigarette or any type of tobacco, and the need for long term follow up was also discussed with patient. - f/u in 1 year  3,4. Epiretinal membrane, OU- The natural history, anatomy, potential for loss of vision, and treatment options including vitrectomy techniques and the complications of endophthalmitis, retinal detachment, vitreous hemorrhage, cataract progression and permanent vision loss discussed with the patient. - relatively mild ERM OU -- blunted central foveal depression - OD with mild cystic changes 2/2 BRVO as above; OS without cystic changes or edema - discussed findings and prognosis - recommend monitoring for now  5.  Pseudophakia OS  -  s/p CE/IOL 11.13.19 w/ Dr. [name]  - beautiful surgery -- IOL in perfect position  - healing well post-operatively  - continue post op drops per [name]   6. Combined form age-related cataract OD-  - The symptoms of cataract, surgical options, and treatments and risks were discussed with patient. - discussed diagnosis and progression - visually significant -- reports significant glare symptoms and trouble driving at night - clear from a retina standpoint to proceed with cataract surgery OD - recommend timing anti-VEGF injection 1-2 wks prior to cataract surgery OD  6,7. Ocular hypertension / Glaucoma suspect OD>OS-  - IOP ~25 OD, 20 OS at both prior clinic visits - today IOP improved to 15 OS, OD is 21-- on cosopt BID OU - denies any family hx of glaucoma - under the expert care of Dr. [name] who is actively managing with possible goniotomy - cont cosopt BID OU  Ophthalmic Meds Ordered this visit:  Meds ordered this encounter  Medications  . Bevacizumab (AVASTIN) SOLN 1.25 mg       Return in about 5 weeks (around 01/17/2019) for 5-6 wk f/u for BRVO w/CME OD.  DFE, OCT OU.  Possible injection..  There are no Patient Instructions on file for this visit.   Explained the diagnoses, plan, and follow up with the patient and they expressed understanding.  Patient expressed understanding of the importance of proper follow up care.   This document serves as a record of services personally performed by Gardiner Sleeper, MD, PhD. It was created on their behalf by Ernest Mallick, OA, an ophthalmic assistant. The creation of this record is the provider's dictation and/or activities during the visit.    Electronically signed by: Ernest Mallick, OA  12.16.19 3:42 PM    Gardiner Sleeper, M.D., Ph.D. Diseases & Surgery of the Retina and Vitreous [facility]  I have reviewed the above documentation for accuracy and completeness, and I agree with the above.  Gardiner Sleeper, M.D., Ph.D. 12/13/18 3:45 PM   Abbreviations: M myopia (nearsighted); A astigmatism; H hyperopia (farsighted); P presbyopia; Mrx spectacle prescription;  CTL contact lenses; OD right eye; OS left eye; OU both eyes  XT exotropia; ET esotropia; PEK punctate epithelial keratitis; PEE punctate epithelial erosions; DES dry eye syndrome; MGD meibomian gland dysfunction; ATs artificial tears; PFAT's preservative free artificial tears; [place] nuclear sclerotic cataract; PSC posterior subcapsular cataract; ERM epi-retinal membrane; PVD posterior vitreous detachment; RD retinal detachment; DM diabetes mellitus; DR diabetic retinopathy; NPDR non-proliferative diabetic retinopathy; PDR proliferative diabetic retinopathy; CSME clinically significant macular edema; DME diabetic macular edema; dbh dot blot hemorrhages; CWS cotton wool spot; POAG primary open angle glaucoma; C/D cup-to-disc ratio; HVF humphrey visual field; GVF goldmann visual field; OCT optical coherence tomography; IOP intraocular pressure; BRVO Branch retinal vein occlusion; CRVO central retinal vein occlusion; CRAO central retinal artery occlusion; BRAO branch retinal artery occlusion; RT retinal tear; SB scleral buckle; PPV pars plana vitrectomy; VH Vitreous hemorrhage; PRP panretinal laser photocoagulation; IVK intravitreal kenalog; VMT vitreomacular traction; MH Macular hole;  NVD neovascularization of the disc; NVE neovascularization elsewhere; AREDS age related eye disease study; ARMD age related macular degeneration; POAG primary open angle glaucoma; EBMD epithelial/anterior basement membrane dystrophy; ACIOL anterior chamber intraocular lens; IOL intraocular lens; PCIOL posterior chamber intraocular lens; Phaco/IOL phacoemulsification with intraocular lens placement; [place] photorefractive keratectomy; LASIK laser assisted in situ keratomileusis; HTN hypertension; DM diabetes mellitus; COPD chronic obstructive pulmonary disease

## 2018-12-13 ENCOUNTER — Ambulatory Visit (INDEPENDENT_AMBULATORY_CARE_PROVIDER_SITE_OTHER): Payer: Medicare Other | Admitting: Ophthalmology

## 2018-12-13 ENCOUNTER — Encounter (INDEPENDENT_AMBULATORY_CARE_PROVIDER_SITE_OTHER): Payer: Self-pay | Admitting: Ophthalmology

## 2018-12-13 DIAGNOSIS — H3581 Retinal edema: Secondary | ICD-10-CM | POA: Diagnosis not present

## 2018-12-13 DIAGNOSIS — H34831 Tributary (branch) retinal vein occlusion, right eye, with macular edema: Secondary | ICD-10-CM

## 2018-12-13 DIAGNOSIS — H35373 Puckering of macula, bilateral: Secondary | ICD-10-CM

## 2018-12-13 DIAGNOSIS — E119 Type 2 diabetes mellitus without complications: Secondary | ICD-10-CM

## 2018-12-13 DIAGNOSIS — H25813 Combined forms of age-related cataract, bilateral: Secondary | ICD-10-CM

## 2018-12-13 DIAGNOSIS — H40001 Preglaucoma, unspecified, right eye: Secondary | ICD-10-CM

## 2018-12-13 DIAGNOSIS — H40051 Ocular hypertension, right eye: Secondary | ICD-10-CM

## 2018-12-13 MED ORDER — BEVACIZUMAB CHEMO INJECTION 1.25MG/0.05ML SYRINGE FOR KALEIDOSCOPE
1.2500 mg | INTRAVITREAL | Status: DC
  Administered 2018-12-13: 1.25 mg via INTRAVITREAL

## 2018-12-29 ENCOUNTER — Other Ambulatory Visit: Payer: Self-pay | Admitting: Family Medicine

## 2018-12-29 DIAGNOSIS — I10 Essential (primary) hypertension: Secondary | ICD-10-CM

## 2019-01-01 ENCOUNTER — Other Ambulatory Visit: Payer: Self-pay | Admitting: Family Medicine

## 2019-01-01 DIAGNOSIS — E079 Disorder of thyroid, unspecified: Secondary | ICD-10-CM

## 2019-01-09 ENCOUNTER — Encounter: Payer: Self-pay | Admitting: Family Medicine

## 2019-01-09 DIAGNOSIS — R413 Other amnesia: Secondary | ICD-10-CM

## 2019-01-16 NOTE — Progress Notes (Signed)
[facility] Note  01/17/2019     CHIEF COMPLAINT Patient presents for Retina Follow Up   HISTORY OF PRESENT ILLNESS: Alejandra Davis is a 83 y.o. female who presents to the clinic today for:   HPI    Retina Follow Up    Patient presents with  Other.  In right eye.  This started 5 weeks ago.  Severity is moderate.  Duration of 5 weeks.  Since onset it is stable.  I, the attending physician,  performed the HPI with the patient and updated documentation appropriately.          Comments    Patient here for 5 weeks follow up for BRVO with CME OD. Patient states Vision doing ok. No eye pain. Without glasses eye blurry then clears up.  Patient saw Dr [name] this morning. He saw swelling in eye. Eye pressure better. Eyes a little dry. Recommended to use lubricating drops. He said healing pretty good.        Last edited by [name], MD on 01/20/2019  4:43 PM. (History)    pt states she had cataract sx on January 16th,     Lab Results  Component Value Date   HGBA1C 6.5 09/06/2018     Referring physician: Libby Maw, MD [place], [place] [address]  HISTORICAL INFORMATION:   Selected notes from the MEDICAL RECORD NUMBER Referred by [name] for DM exam LEE-  Ocular Hx-  PMH- HTN, thyroid disease, DM (A1C- 6.5)    CURRENT MEDICATIONS: Current Outpatient Medications (Ophthalmic Drugs)  Medication Sig  . dorzolamide-timolol (COSOPT) 22.3-6.8 MG/ML ophthalmic solution Place 1 drop into the right eye 2 (two) times daily.   No current facility-administered medications for this visit.  (Ophthalmic Drugs)   Current Outpatient Medications (Other)  Medication Sig  . amLODipine (NORVASC) 10 MG tablet TAKE 1 TABLET BY MOUTH ONCE DAILY  . calcium-vitamin D (OSCAL 500/200 D-3) 500-200 MG-UNIT tablet Take 1 tablet by mouth 2 (two) times daily.  Marland Kitchen levothyroxine (SYNTHROID, LEVOTHROID) 75 MCG tablet TAKE 1 TABLET(75 MCG)  BY MOUTH DAILY  . metFORMIN (GLUCOPHAGE-XR) 500 MG 24 hr tablet Take 1 tablet (500 mg total) by mouth at bedtime.  . metoprolol succinate (TOPROL-XL) 50 MG 24 hr tablet Take 1 tablet (50 mg total) by mouth daily.  Marland Kitchen triamcinolone ointment (KENALOG) 0.1 % Apply 1 application topically 2 (two) times daily. For rash on thighs and arms.   Current Facility-Administered Medications (Other)  Medication Route  . Bevacizumab (AVASTIN) SOLN 1.25 mg Intravitreal  . Bevacizumab (AVASTIN) SOLN 1.25 mg Intravitreal  . Bevacizumab (AVASTIN) SOLN 1.25 mg Intravitreal      REVIEW OF SYSTEMS: ROS    Positive for: Endocrine, Cardiovascular, Eyes   Negative for: Constitutional, Gastrointestinal, Neurological, Skin, Genitourinary, Musculoskeletal, HENT, Respiratory, Psychiatric, Allergic/Imm, Heme/Lymph   Last edited by [name] on 01/17/2019  3:23 PM. (History)       ALLERGIES No Known Allergies  PAST MEDICAL HISTORY Past Medical History:  Diagnosis Date  . Chicken pox   . Cystocele   . Diabetes mellitus without complication ([place])   . Hypertension   . Thyroid disease    hypothyroidism   Past Surgical History:  Procedure Laterality Date  . ABDOMINAL HYSTERECTOMY  1990   TAH BSO  . OOPHORECTOMY     BSO  . Vaginal Bx     Papilloma    FAMILY HISTORY Family History  Problem  Relation Age of Onset  . Sickle cell anemia Other   . Hypertension Mother   . Cancer Father        LIVER  . Heart disease Brother   . Cancer Brother        STOMACH    SOCIAL HISTORY Social History   Tobacco Use  . Smoking status: Never Smoker  . Smokeless tobacco: Never Used  Substance Use Topics  . Alcohol use: No  . Drug use: No         OPHTHALMIC EXAM:  Base Eye Exam    Visual Acuity (Snellen - Linear)      Right Left   Dist cc 20/40 20/30   Dist ph cc NI 20/20 -1   Correction:  Glasses       Tonometry (Tonopen, 3:17 PM)      Right Left   Pressure 14 11       Pupils       Dark Light Shape React APD   Right 3 2 Round Brisk None   Left 3 2 Round Brisk None       Visual Fields (Counting fingers)      Left Right    Full Full       Extraocular Movement      Right Left    Full, Ortho Full, Ortho       Neuro/Psych    Oriented x3:  Yes   Mood/Affect:  Normal       Dilation    Both eyes:  1.0% Mydriacyl, 2.5% Phenylephrine @ 3:17 PM        Slit Lamp and Fundus Exam    Slit Lamp Exam      Right Left   Lids/Lashes Dermatochalasis - upper lid, Meibomian gland dysfunction Dermatochalasis - upper lid, Meibomian gland dysfunction   Conjunctiva/Sclera Melanosis Melanosis   Cornea Arcus, 2-3+ Punctate epithelial erosions, mild Anterior basement membrane dystrophy, Well healed cataract wounds, 1+ Descemet's folds tempoally Arcus, Inferior 1+ Punctate epithelial erosions; 2-3+ microcystic edema temporal with 2+ descemet folds, Keratic precipitates.  Well healed temporal cat sx incision.   Anterior Chamber Deep, 1+cell/pigment Deep; 1/2+ pigment; no cell   Iris Round and dilated, No NVI Round and dilated, No NVI   Lens PC IOL in good position Posterior chamber intraocular lens in good position.  1+ PCO.   Vitreous Vitreous syneresis Vitreous syneresis       Fundus Exam      Right Left   Disc Sharp rim, inferior notch, 1+ pallor, inf. Rim thinning Mild pallor.   C/D Ratio 0.7 0.6   Macula Blunted foveal reflex, scattered DBH S and T macula -- improved from prior Blunted foveal reflex, flat, RPE mottling, Epiretinal membrane, No heme or edema   Vessels Severe vascular attenuation superiorly, copper wiring, dilated superior veins, positive A/V Crossing changes. Superior BVO OD. Mild vascular attenuation, AV crossing changes   Periphery Attached, superior DBH Attached, scattered Reticular degeneration        Refraction    Wearing Rx      Sphere Cylinder Axis Add   Right -0.75 +1.25 170 +2.50   Left -0.75 +1.00 003 +2.50   Type:  PAL          IMAGING  AND PROCEDURES  Imaging and Procedures for 03/07/18  OCT, Retina - OU - Both Eyes       Right Eye Quality was borderline. Central Foveal Thickness: 258. Progression has been stable. Findings include abnormal foveal contour,  no SRF, epiretinal membrane, intraretinal fluid (Blunted foveal reflex -- trace cystic changes v schisis temporal macula caught on widefield).   Left Eye Quality was borderline. Central Foveal Thickness: 260. Progression has been stable. Findings include abnormal foveal contour, epiretinal membrane, no IRF, no SRF, vitreomacular adhesion  (Blunted foveal reflex).   Notes *Images captured and stored on drive  Diagnosis / Impression:  ERM OU OD: Blunted foveal reflex -- trace cystic changes v schisis temporal macula caught on widefield OS: very mild ERM with blunted foveal reflex -- stable; mild VMA  Clinical management:  See below  Abbreviations: NFP - Normal foveal profile. CME - cystoid macular edema. PED - pigment epithelial detachment. IRF - intraretinal fluid. SRF - subretinal fluid. EZ - ellipsoid zone. ERM - epiretinal membrane. ORA - outer retinal atrophy. ORT - outer retinal tubulation. SRHM - subretinal hyper-reflective material                  ASSESSMENT/PLAN:    ICD-10-CM   1. Branch retinal vein occlusion of right eye with macular edema H34.8310 CANCELED: Intravitreal Injection, Pharmacologic Agent - OD - Right Eye  2. Diabetes mellitus type 2 without retinopathy ([place]) E11.9   3. Epiretinal membrane (ERM) of both eyes H35.373   4. Retinal edema H35.81 OCT, Retina - OU - Both Eyes  5. Pseudophakia of both eyes Z96.1   6. Ocular hypertension of right eye H40.051   7. Glaucoma suspect of right eye H40.001    1. BRVO with CME OD - at presentation, BCVA 20/60 OD -- down from 20/30 - initial OCT w/ CME superior macula - s/p IVA OD #1 (10.18.19), #2 (11.19.19), #3 (12.17.19) - good response to medications - today, BCVA 20/40 OD and OCT w/  stable improvement in IRF - recommend holding off on injection today, 01.21.20 as just had cataract surgery OD last week - F/U 4 weeks -- DFE/OCT/possible injection  2. Diabetes mellitus, type 2 without retinopathy - The incidence, risk factors for progression, natural history and treatment options for diabetic retinopathy  were discussed with patient.   - The need for close monitoring of blood glucose, blood pressure, and serum lipids, avoiding cigarette or any type of tobacco, and the need for long term follow up was also discussed with patient.  3,4. Epiretinal membrane, OU- The natural history, anatomy, potential for loss of vision, and treatment options including vitrectomy techniques and the complications of endophthalmitis, retinal detachment, vitreous hemorrhage, cataract progression and permanent vision loss discussed with the patient. - relatively mild ERM OU -- blunted central foveal depression - OD with mild cystic changes 2/2 BRVO as above; OS without cystic changes or edema - discussed findings and prognosis - recommend monitoring for now  5. Pseudophakia OU  - s/p CE/IOL OS 11.13.19 w/ Dr. [name]  - s/p CE/IOL OD 01.16.20 w/ Dr. [name]  - beautiful surgeries -- IOLs in perfect position  - healing well post-operatively  - continue post op drops per [name]  6,7. Ocular hypertension / Glaucoma suspect OD>OS-  - IOP ~25 OD, 20 OS at both prior clinic visits - today IOP improved to 11 OS, OD is 14-- on cosopt BID OU - denies any family hx of glaucoma - under the expert care of Dr. [name] who is actively managing - cont cosopt BID OU  Ophthalmic Meds Ordered this visit:  No orders of the defined types were placed in this encounter.      Return in about 4 weeks (around 02/14/2019)  for f/u BRVO OD, DFE, OCT.  There are no Patient Instructions on file for this visit.   Explained the diagnoses, plan, and follow up with the patient and they expressed understanding.  Patient  expressed understanding of the importance of proper follow up care.   This document serves as a record of services personally performed by Gardiner Sleeper, MD, PhD. It was created on their behalf by Ernest Mallick, OA, an ophthalmic assistant. The creation of this record is the provider's dictation and/or activities during the visit.    Electronically signed by: Ernest Mallick, OA  01.20.2020 4:44 PM     Gardiner Sleeper, M.D., Ph.D. Diseases & Surgery of the Retina and Vitreous [facility]  I have reviewed the above documentation for accuracy and completeness, and I agree with the above. Gardiner Sleeper, M.D., Ph.D. 01/20/19 4:46 PM     Abbreviations: M myopia (nearsighted); A astigmatism; H hyperopia (farsighted); P presbyopia; Mrx spectacle prescription;  CTL contact lenses; OD right eye; OS left eye; OU both eyes  XT exotropia; ET esotropia; PEK punctate epithelial keratitis; PEE punctate epithelial erosions; DES dry eye syndrome; MGD meibomian gland dysfunction; ATs artificial tears; PFAT's preservative free artificial tears; [place] nuclear sclerotic cataract; PSC posterior subcapsular cataract; ERM epi-retinal membrane; PVD posterior vitreous detachment; RD retinal detachment; DM diabetes mellitus; DR diabetic retinopathy; NPDR non-proliferative diabetic retinopathy; PDR proliferative diabetic retinopathy; CSME clinically significant macular edema; DME diabetic macular edema; dbh dot blot hemorrhages; CWS cotton wool spot; POAG primary open angle glaucoma; C/D cup-to-disc ratio; HVF humphrey visual field; GVF goldmann visual field; OCT optical coherence tomography; IOP intraocular pressure; BRVO Branch retinal vein occlusion; CRVO central retinal vein occlusion; CRAO central retinal artery occlusion; BRAO branch retinal artery occlusion; RT retinal tear; SB scleral buckle; PPV pars plana vitrectomy; VH Vitreous hemorrhage; PRP panretinal laser photocoagulation; IVK intravitreal  kenalog; VMT vitreomacular traction; MH Macular hole;  NVD neovascularization of the disc; NVE neovascularization elsewhere; AREDS age related eye disease study; ARMD age related macular degeneration; POAG primary open angle glaucoma; EBMD epithelial/anterior basement membrane dystrophy; ACIOL anterior chamber intraocular lens; IOL intraocular lens; PCIOL posterior chamber intraocular lens; Phaco/IOL phacoemulsification with intraocular lens placement; [place] photorefractive keratectomy; LASIK laser assisted in situ keratomileusis; HTN hypertension; DM diabetes mellitus; COPD chronic obstructive pulmonary disease

## 2019-01-17 ENCOUNTER — Ambulatory Visit (INDEPENDENT_AMBULATORY_CARE_PROVIDER_SITE_OTHER): Payer: Medicare Other | Admitting: Ophthalmology

## 2019-01-17 DIAGNOSIS — H34831 Tributary (branch) retinal vein occlusion, right eye, with macular edema: Secondary | ICD-10-CM

## 2019-01-17 DIAGNOSIS — H3581 Retinal edema: Secondary | ICD-10-CM | POA: Diagnosis not present

## 2019-01-17 DIAGNOSIS — E119 Type 2 diabetes mellitus without complications: Secondary | ICD-10-CM

## 2019-01-17 DIAGNOSIS — H40051 Ocular hypertension, right eye: Secondary | ICD-10-CM

## 2019-01-17 DIAGNOSIS — H35373 Puckering of macula, bilateral: Secondary | ICD-10-CM | POA: Diagnosis not present

## 2019-01-17 DIAGNOSIS — Z961 Presence of intraocular lens: Secondary | ICD-10-CM

## 2019-01-17 DIAGNOSIS — H40001 Preglaucoma, unspecified, right eye: Secondary | ICD-10-CM

## 2019-01-20 ENCOUNTER — Encounter: Payer: Self-pay | Admitting: Family Medicine

## 2019-01-20 ENCOUNTER — Encounter (INDEPENDENT_AMBULATORY_CARE_PROVIDER_SITE_OTHER): Payer: Self-pay | Admitting: Ophthalmology

## 2019-02-04 ENCOUNTER — Encounter: Payer: Self-pay | Admitting: Family Medicine

## 2019-02-05 ENCOUNTER — Encounter: Payer: Self-pay | Admitting: Family Medicine

## 2019-02-14 NOTE — Progress Notes (Signed)
[facility] Note  02/15/2019     CHIEF COMPLAINT Patient presents for Retina Follow Up   HISTORY OF PRESENT ILLNESS: Alejandra Davis is a 83 y.o. female who presents to the clinic today for:   HPI    Retina Follow Up    Patient presents with  CRVO/BRVO.  In right eye.  This started 4 weeks ago.  Severity is moderate.  Duration of 4 weeks.  Since onset it is stable.  I, the attending physician,  performed the HPI with the patient and updated documentation appropriately.          Comments    Patient here for 4 weeks follow up for BRVO with CME OD. Patient states vision coming along. No eye pain       Last edited by [name], MD on 02/15/2019  4:11 PM. (History)    pt states she had cataract sx on January 16th,     Lab Results  Component Value Date   HGBA1C 6.5 09/06/2018     Referring physician: Libby Maw, MD [place], [place] [address]  HISTORICAL INFORMATION:   Selected notes from the MEDICAL RECORD NUMBER Referred by [name] for DM exam LEE-  Ocular Hx-  PMH- HTN, thyroid disease, DM (A1C- 6.5)    CURRENT MEDICATIONS: Current Outpatient Medications (Ophthalmic Drugs)  Medication Sig  . dorzolamide-timolol (COSOPT) 22.3-6.8 MG/ML ophthalmic solution Place 1 drop into the right eye 2 (two) times daily.   No current facility-administered medications for this visit.  (Ophthalmic Drugs)   Current Outpatient Medications (Other)  Medication Sig  . amLODipine (NORVASC) 10 MG tablet TAKE 1 TABLET BY MOUTH ONCE DAILY  . calcium-vitamin D (OSCAL 500/200 D-3) 500-200 MG-UNIT tablet Take 1 tablet by mouth 2 (two) times daily.  Marland Kitchen levothyroxine (SYNTHROID, LEVOTHROID) 75 MCG tablet TAKE 1 TABLET(75 MCG) BY MOUTH DAILY  . metFORMIN (GLUCOPHAGE-XR) 500 MG 24 hr tablet Take 1 tablet (500 mg total) by mouth at bedtime.  . metoprolol succinate (TOPROL-XL) 50 MG 24 hr tablet Take 1 tablet (50 mg total) by  mouth daily.  Marland Kitchen triamcinolone ointment (KENALOG) 0.1 % Apply 1 application topically 2 (two) times daily. For rash on thighs and arms.   Current Facility-Administered Medications (Other)  Medication Route  . Bevacizumab (AVASTIN) SOLN 1.25 mg Intravitreal  . Bevacizumab (AVASTIN) SOLN 1.25 mg Intravitreal  . Bevacizumab (AVASTIN) SOLN 1.25 mg Intravitreal  . Bevacizumab (AVASTIN) SOLN 1.25 mg Intravitreal      REVIEW OF SYSTEMS: ROS    Positive for: Endocrine, Cardiovascular, Eyes   Negative for: Constitutional, Gastrointestinal, Neurological, Skin, Genitourinary, Musculoskeletal, HENT, Respiratory, Psychiatric, Allergic/Imm, Heme/Lymph   Last edited by [name] on 02/15/2019  3:16 PM. (History)       ALLERGIES No Known Allergies  PAST MEDICAL HISTORY Past Medical History:  Diagnosis Date  . Chicken pox   . Cystocele   . Diabetes mellitus without complication ([place])   . Hypertension   . Thyroid disease    hypothyroidism   Past Surgical History:  Procedure Laterality Date  . ABDOMINAL HYSTERECTOMY  1990   TAH BSO  . OOPHORECTOMY     BSO  . Vaginal Bx     Papilloma    FAMILY HISTORY Family History  Problem Relation Age of Onset  . Sickle cell anemia Other   . Hypertension Mother   . Cancer Father        LIVER  .  Heart disease Brother   . Cancer Brother        STOMACH    SOCIAL HISTORY Social History   Tobacco Use  . Smoking status: Never Smoker  . Smokeless tobacco: Never Used  Substance Use Topics  . Alcohol use: No  . Drug use: No         OPHTHALMIC EXAM:  Base Eye Exam    Visual Acuity (Snellen - Linear)      Right Left   Dist cc 20/40 -1 20/25   Dist ph cc NI 20/20 -1   Correction:  Glasses       Tonometry (Tonopen, 3:14 PM)      Right Left   Pressure 12 11       Pupils      Dark Light Shape React APD   Right 3 2 Round Brisk None   Left 3 2 Round Brisk None       Visual Fields (Counting fingers)      Left Right     Full Full       Extraocular Movement      Right Left    Full, Ortho Full, Ortho       Neuro/Psych    Oriented x3:  Yes   Mood/Affect:  Normal       Dilation    Both eyes:  1.0% Mydriacyl, 2.5% Phenylephrine @ 3:14 PM        Slit Lamp and Fundus Exam    Slit Lamp Exam      Right Left   Lids/Lashes Dermatochalasis - upper lid, Meibomian gland dysfunction Dermatochalasis - upper lid, Meibomian gland dysfunction   Conjunctiva/Sclera Melanosis Melanosis   Cornea Arcus, 2-3+ Punctate epithelial erosions, mild Anterior basement membrane dystrophy, Well healed cataract wounds, 1+ Descemet's folds tempoally Arcus, Inferior 1+ Punctate epithelial erosions; 2-3+ microcystic edema temporal with 2+ descemet folds, Keratic precipitates.  Well healed temporal cat sx incision.   Anterior Chamber Deep, no cell or flare Deep;  no cell or flare   Iris Round and dilated, No NVI Round and dilated, No NVI   Lens PC IOL in good position Posterior chamber intraocular lens in good position.  1+ PCO.   Vitreous Vitreous syneresis Vitreous syneresis       Fundus Exam      Right Left   Disc Sharp rim, inferior notch, 1+ pallor, inf. Rim thinning, mild disc heme at 10:30 trace pallor   C/D Ratio 0.7 0.6   Macula Blunted foveal reflex, scattered DBH S and T macula -- improved from prior, interval recurrence of central IRF Blunted foveal reflex, flat, RPE mottling, Epiretinal membrane, No heme or edema   Vessels Severe vascular attenuation superiorly, copper wiring, dilated superior veins, positive A/V Crossing changes. Superior BVO OD. Mild vascular attenuation, AV crossing changes   Periphery Attached, superior DBH Attached, scattered Reticular degeneration        Refraction    Wearing Rx      Sphere Cylinder Axis Add   Right -0.75 +1.25 170 +2.50   Left -0.75 +1.00 003 +2.50   Type:  PAL          IMAGING AND PROCEDURES  Imaging and Procedures for 03/07/18  OCT, Retina - OU - Both Eyes        Right Eye Quality was borderline. Central Foveal Thickness: 278. Progression has worsened. Findings include abnormal foveal contour, no SRF, epiretinal membrane, intraretinal fluid (Interval increase in central IRF).   Left Eye Quality  was borderline. Central Foveal Thickness: 267. Progression has been stable. Findings include abnormal foveal contour, epiretinal membrane, no IRF, no SRF, vitreomacular adhesion  (Blunted foveal reflex).   Notes *Images captured and stored on drive  Diagnosis / Impression:  ERM OU OD: interval increase in central IRF OS: very mild ERM with blunted foveal reflex -- stable; mild VMA  Clinical management:  See below  Abbreviations: NFP - Normal foveal profile. CME - cystoid macular edema. PED - pigment epithelial detachment. IRF - intraretinal fluid. SRF - subretinal fluid. EZ - ellipsoid zone. ERM - epiretinal membrane. ORA - outer retinal atrophy. ORT - outer retinal tubulation. SRHM - subretinal hyper-reflective material         Intravitreal Injection, Pharmacologic Agent - OD - Right Eye       Time Out 02/15/2019. 4:54 PM. Confirmed correct patient, procedure, site, and patient consented.   Anesthesia Topical anesthesia was used. Anesthetic medications included Lidocaine 2%, Proparacaine 0.5%.   Procedure Preparation included 5% betadine to ocular surface, eyelid speculum. A supplied needle was used.   Injection:  1.25 mg Bevacizumab (AVASTIN) SOLN   NDC: 54656-812-75, Lot: [id number] , Expiration date: 04/19/2019   Route: Intravitreal, Site: Right Eye, Waste: 0 mg  Post-op Post injection exam found visual acuity of at least counting fingers. The patient tolerated the procedure well. There were no complications. The patient received written and verbal post procedure care education.                 ASSESSMENT/PLAN:    ICD-10-CM   1. Branch retinal vein occlusion of right eye with macular edema H34.8310 Intravitreal Injection,  Pharmacologic Agent - OD - Right Eye    Bevacizumab (AVASTIN) SOLN 1.25 mg  2. Diabetes mellitus type 2 without retinopathy ([place]) E11.9   3. Epiretinal membrane (ERM) of both eyes H35.373   4. Retinal edema H35.81 OCT, Retina - OU - Both Eyes  5. Pseudophakia of both eyes Z96.1   6. Ocular hypertension of right eye H40.051   7. Glaucoma suspect of right eye H40.001   8. Combined forms of age-related cataract of both eyes H25.813    1. BRVO with CME OD - at presentation, BCVA 20/60 OD -- down from 20/30 - initial OCT w/ CME superior macula - s/p IVA OD #1 (10.18.19), #2 (11.19.19), #3 (12.17.19) - good response to medications, but held IVA in January due to recent cataract surgery - today, BCVA 20/40 OD and OCT w/ interval increase in central IRF - recommend IVA OD #4 today (02/15/2019) - RBA of procedure discussed, questions answered - informed consent obtained and signed - see procedure note - F/U 4 -5 weeks -- DFE/OCT/possible injection  2. Diabetes mellitus, type 2 without retinopathy - The incidence, risk factors for progression, natural history and treatment options for diabetic retinopathy  were discussed with patient.   - The need for close monitoring of blood glucose, blood pressure, and serum lipids, avoiding cigarette or any type of tobacco, and the need for long term follow up was also discussed with patient.  3,4. Epiretinal membrane, OU- The natural history, anatomy, potential for loss of vision, and treatment options including vitrectomy techniques and the complications of endophthalmitis, retinal detachment, vitreous hemorrhage, cataract progression and permanent vision loss discussed with the patient. - relatively mild ERM OU -- blunted central foveal depression - OD with mild cystic changes 2/2 BRVO as above; OS without cystic changes or edema - discussed findings and prognosis - recommend monitoring for now  5. Pseudophakia OU  - s/p CE/IOL OS 11.13.19 w/ Dr.  [name]  - s/p CE/IOL OD 01.16.20 w/ Dr. [name]  - beautiful surgeries -- IOLs in perfect position  - healing well post-operatively  - completing post op drops OD per [name]  6,7. Ocular hypertension / Glaucoma suspect OD>OS-  - IOP ~25 OD, 20 OS at both prior clinic visits - today IOP improved to 11 OS, OD is 14-- on cosopt BID OU - denies any family hx of glaucoma - under the expert care of Dr. [name] who is actively managing - cont cosopt BID OU  Ophthalmic Meds Ordered this visit:  Meds ordered this encounter  Medications  . Bevacizumab (AVASTIN) SOLN 1.25 mg       Return 4-5 weeks, for DFE, OCT.  There are no Patient Instructions on file for this visit.   Explained the diagnoses, plan, and follow up with the patient and they expressed understanding.  Patient expressed understanding of the importance of proper follow up care.   This document serves as a record of services personally performed by Gardiner Sleeper, MD, PhD. It was created on their behalf by Ernest Mallick, OA, an ophthalmic assistant. The creation of this record is the provider's dictation and/or activities during the visit.    Electronically signed by: Ernest Mallick, OA  02.18.2020 10:05 AM    Gardiner Sleeper, M.D., Ph.D. Diseases & Surgery of the Retina and Vitreous [facility]  I have reviewed the above documentation for accuracy and completeness, and I agree with the above. Gardiner Sleeper, M.D., Ph.D. 02/16/19 10:08 AM      Abbreviations: M myopia (nearsighted); A astigmatism; H hyperopia (farsighted); P presbyopia; Mrx spectacle prescription;  CTL contact lenses; OD right eye; OS left eye; OU both eyes  XT exotropia; ET esotropia; PEK punctate epithelial keratitis; PEE punctate epithelial erosions; DES dry eye syndrome; MGD meibomian gland dysfunction; ATs artificial tears; PFAT's preservative free artificial tears; Arnold nuclear sclerotic cataract; PSC posterior subcapsular  cataract; ERM epi-retinal membrane; PVD posterior vitreous detachment; RD retinal detachment; DM diabetes mellitus; DR diabetic retinopathy; NPDR non-proliferative diabetic retinopathy; PDR proliferative diabetic retinopathy; CSME clinically significant macular edema; DME diabetic macular edema; dbh dot blot hemorrhages; CWS cotton wool spot; POAG primary open angle glaucoma; C/D cup-to-disc ratio; HVF humphrey visual field; GVF goldmann visual field; OCT optical coherence tomography; IOP intraocular pressure; BRVO Branch retinal vein occlusion; CRVO central retinal vein occlusion; CRAO central retinal artery occlusion; BRAO branch retinal artery occlusion; RT retinal tear; SB scleral buckle; PPV pars plana vitrectomy; VH Vitreous hemorrhage; PRP panretinal laser photocoagulation; IVK intravitreal kenalog; VMT vitreomacular traction; MH Macular hole;  NVD neovascularization of the disc; NVE neovascularization elsewhere; AREDS age related eye disease study; ARMD age related macular degeneration; POAG primary open angle glaucoma; EBMD epithelial/anterior basement membrane dystrophy; ACIOL anterior chamber intraocular lens; IOL intraocular lens; PCIOL posterior chamber intraocular lens; Phaco/IOL phacoemulsification with intraocular lens placement; Trigg photorefractive keratectomy; LASIK laser assisted in situ keratomileusis; HTN hypertension; DM diabetes mellitus; COPD chronic obstructive pulmonary disease

## 2019-02-15 ENCOUNTER — Ambulatory Visit (INDEPENDENT_AMBULATORY_CARE_PROVIDER_SITE_OTHER): Payer: Medicare Other | Admitting: Ophthalmology

## 2019-02-15 ENCOUNTER — Encounter (INDEPENDENT_AMBULATORY_CARE_PROVIDER_SITE_OTHER): Payer: Self-pay | Admitting: Ophthalmology

## 2019-02-15 DIAGNOSIS — H3581 Retinal edema: Secondary | ICD-10-CM

## 2019-02-15 DIAGNOSIS — H40051 Ocular hypertension, right eye: Secondary | ICD-10-CM

## 2019-02-15 DIAGNOSIS — H34831 Tributary (branch) retinal vein occlusion, right eye, with macular edema: Secondary | ICD-10-CM | POA: Diagnosis not present

## 2019-02-15 DIAGNOSIS — H35373 Puckering of macula, bilateral: Secondary | ICD-10-CM

## 2019-02-15 DIAGNOSIS — E119 Type 2 diabetes mellitus without complications: Secondary | ICD-10-CM | POA: Diagnosis not present

## 2019-02-15 DIAGNOSIS — H40001 Preglaucoma, unspecified, right eye: Secondary | ICD-10-CM

## 2019-02-15 DIAGNOSIS — Z961 Presence of intraocular lens: Secondary | ICD-10-CM

## 2019-02-15 MED ORDER — BEVACIZUMAB CHEMO INJECTION 1.25MG/0.05ML SYRINGE FOR KALEIDOSCOPE
1.2500 mg | INTRAVITREAL | Status: DC
  Administered 2019-02-15: 1.25 mg via INTRAVITREAL

## 2019-02-28 ENCOUNTER — Encounter: Payer: Self-pay | Admitting: Family Medicine

## 2019-03-13 ENCOUNTER — Ambulatory Visit: Payer: Medicare Other | Admitting: Neurology

## 2019-03-13 ENCOUNTER — Other Ambulatory Visit: Payer: Self-pay

## 2019-03-13 ENCOUNTER — Ambulatory Visit: Payer: Medicare Other | Admitting: Family Medicine

## 2019-03-13 ENCOUNTER — Encounter: Payer: Self-pay | Admitting: Family Medicine

## 2019-03-13 ENCOUNTER — Telehealth: Payer: Self-pay

## 2019-03-13 VITALS — BP 120/78 | HR 63 | Temp 97.4°F | Ht 62.0 in | Wt 129.5 lb

## 2019-03-13 DIAGNOSIS — E039 Hypothyroidism, unspecified: Secondary | ICD-10-CM | POA: Diagnosis not present

## 2019-03-13 DIAGNOSIS — E079 Disorder of thyroid, unspecified: Secondary | ICD-10-CM | POA: Diagnosis not present

## 2019-03-13 DIAGNOSIS — E119 Type 2 diabetes mellitus without complications: Secondary | ICD-10-CM

## 2019-03-13 DIAGNOSIS — I10 Essential (primary) hypertension: Secondary | ICD-10-CM | POA: Diagnosis not present

## 2019-03-13 LAB — URINALYSIS, ROUTINE W REFLEX MICROSCOPIC
Bilirubin Urine: NEGATIVE
HGB URINE DIPSTICK: NEGATIVE
Ketones, ur: NEGATIVE
Leukocytes,Ua: NEGATIVE
Nitrite: NEGATIVE
RBC / HPF: NONE SEEN (ref 0–?)
Specific Gravity, Urine: 1.015 (ref 1.000–1.030)
Total Protein, Urine: 30 — AB
Urine Glucose: NEGATIVE
Urobilinogen, UA: 0.2 (ref 0.0–1.0)
pH: 8 (ref 5.0–8.0)

## 2019-03-13 LAB — COMPREHENSIVE METABOLIC PANEL
ALBUMIN: 4.2 g/dL (ref 3.5–5.2)
ALT: 13 U/L (ref 0–35)
AST: 18 U/L (ref 0–37)
Alkaline Phosphatase: 71 U/L (ref 39–117)
BUN: 18 mg/dL (ref 6–23)
CO2: 27 mEq/L (ref 19–32)
CREATININE: 1.18 mg/dL (ref 0.40–1.20)
Calcium: 10.2 mg/dL (ref 8.4–10.5)
Chloride: 103 mEq/L (ref 96–112)
GFR: 53.27 mL/min — ABNORMAL LOW (ref >60.00)
Glucose, Bld: 138 mg/dL — ABNORMAL HIGH (ref 70–99)
Potassium: 5 mEq/L (ref 3.5–5.1)
Sodium: 140 mEq/L (ref 135–145)
Total Bilirubin: 0.3 mg/dL (ref 0.2–1.2)
Total Protein: 7.9 g/dL (ref 6.0–8.3)

## 2019-03-13 LAB — CBC
HCT: 41.6 % (ref 36.0–46.0)
Hemoglobin: 13.7 g/dL (ref 12.0–15.0)
MCHC: 33 g/dL (ref 30.0–36.0)
MCV: 81.8 fl (ref 78.0–100.0)
Platelets: 263 10*3/uL (ref 150.0–400.0)
RBC: 5.09 Mil/uL (ref 3.87–5.11)
RDW: 16.8 % — ABNORMAL HIGH (ref 11.5–15.5)
WBC: 5.9 10*3/uL (ref 4.0–10.5)

## 2019-03-13 LAB — HEMOGLOBIN A1C: Hgb A1c MFr Bld: 6.9 % — ABNORMAL HIGH (ref 4.6–6.5)

## 2019-03-13 LAB — TSH: TSH: 1.32 u[IU]/mL (ref 0.35–4.50)

## 2019-03-13 MED ORDER — METFORMIN HCL ER 500 MG PO TB24: 500.0000 mg | ORAL_TABLET | Freq: Every day | ORAL | 1 refills | Status: DC

## 2019-03-13 MED ORDER — LEVOTHYROXINE SODIUM 75 MCG PO TABS: ORAL_TABLET | ORAL | 1 refills | Status: DC

## 2019-03-13 MED ORDER — AMLODIPINE BESYLATE 10 MG PO TABS: 10.0000 mg | ORAL_TABLET | Freq: Every day | ORAL | 1 refills | Status: DC

## 2019-03-13 MED ORDER — METOPROLOL SUCCINATE ER 50 MG PO TB24: 50.0000 mg | ORAL_TABLET | Freq: Every day | ORAL | 1 refills | Status: DC

## 2019-03-13 NOTE — Progress Notes (Signed)
Established Patient Office Visit  Subjective:  Patient ID: Alejandra Davis, female    DOB: [birth year]  Age: 83 y.o. MRN: [id number]  CC:  Chief Complaint  Patient presents with  . Follow-up    HPI Alejandra Davis presents for follow-up of her hypertension is been controlled well with the amlodipine and metoprolol.  Continues to take Glucophage with excellent control of her diabetes.  Hypothyroidism controlled with levothyroid 75 mcg.  Patient is nonfasting today.  [facility]  Past Medical History:  Diagnosis Date  . Chicken pox   . Cystocele   . Diabetes mellitus without complication ([place])   . Hypertension   . Thyroid disease    hypothyroidism    Past Surgical History:  Procedure Laterality Date  . ABDOMINAL HYSTERECTOMY  1990   TAH BSO  . OOPHORECTOMY     BSO  . Vaginal Bx     Papilloma    Family History  Problem Relation Age of Onset  . Sickle cell anemia Other   . Hypertension Mother   . Cancer Father        LIVER  . Heart disease Brother   . Cancer Brother        STOMACH    Social History   Socioeconomic History  . Marital status: Divorced    Spouse name: Not on file  . Number of children: Not on file  . Years of education: Not on file  . Highest education level: Not on file  Occupational History  . Not on file  Social Needs  . Financial resource strain: Not on file  . Food insecurity:    Worry: Not on file    Inability: Not on file  . Transportation needs:    Medical: Not on file    Non-medical: Not on file  Tobacco Use  . Smoking status: Never Smoker  . Smokeless tobacco: Never Used  Substance and Sexual Activity  . Alcohol use: No  . Drug use: No  . Sexual activity: Not on file  Lifestyle  . Physical activity:    Days per week: Not on file    Minutes per session: Not on file  . Stress: Not on file  Relationships  . Social connections:    Talks on phone: Not on file    Gets together: Not on  file    Attends religious service: Not on file    Active member of club or organization: Not on file    Attends meetings of clubs or organizations: Not on file    Relationship status: Not on file  . Intimate partner violence:    Fear of current or ex partner: Not on file    Emotionally abused: Not on file    Physically abused: Not on file    Forced sexual activity: Not on file  Other Topics Concern  . Not on file  Social History Narrative  . Not on file    Outpatient Medications Prior to Visit  Medication Sig Dispense Refill  . calcium-vitamin D (OSCAL 500/200 D-3) 500-200 MG-UNIT tablet Take 1 tablet by mouth 2 (two) times daily. 180 tablet 1  . dorzolamide-timolol (COSOPT) 22.3-6.8 MG/ML ophthalmic solution Place 1 drop into the right eye 2 (two) times daily. 10 mL 1  . triamcinolone ointment (KENALOG) 0.1 % Apply 1 application topically 2 (two) times daily. For rash on thighs and arms. 30 g 0  . amLODipine (NORVASC) 10 MG  tablet TAKE 1 TABLET BY MOUTH ONCE DAILY 90 tablet 1  . levothyroxine (SYNTHROID, LEVOTHROID) 75 MCG tablet TAKE 1 TABLET(75 MCG) BY MOUTH DAILY 90 tablet 1  . metFORMIN (GLUCOPHAGE-XR) 500 MG 24 hr tablet Take 1 tablet (500 mg total) by mouth at bedtime. 90 tablet 1  . metoprolol succinate (TOPROL-XL) 50 MG 24 hr tablet Take 1 tablet (50 mg total) by mouth daily. 90 tablet 1   Facility-Administered Medications Prior to Visit  Medication Dose Route Frequency Provider Last Rate Last Dose  . Bevacizumab (AVASTIN) SOLN 1.25 mg  1.25 mg Intravitreal  [name], MD   1.25 mg at 10/16/18 2335  . Bevacizumab (AVASTIN) SOLN 1.25 mg  1.25 mg Intravitreal  [name], MD   1.25 mg at 11/15/18 0939  . Bevacizumab (AVASTIN) SOLN 1.25 mg  1.25 mg Intravitreal  [name], MD   1.25 mg at 12/13/18 1542  . Bevacizumab (AVASTIN) SOLN 1.25 mg  1.25 mg Intravitreal  [name], MD   1.25 mg at 02/15/19 1725    No Known Allergies  ROS Review of Systems   Constitutional: Negative.   HENT: Negative.   Eyes: Negative for photophobia and visual disturbance.  Respiratory: Negative.   Cardiovascular: Negative.   Gastrointestinal: Negative.   Endocrine: Negative for polyphagia and polyuria.  Genitourinary: Negative.   Musculoskeletal: Negative for gait problem and joint swelling.  Skin: Negative for pallor and rash.  Allergic/Immunologic: Negative for immunocompromised state.  Neurological: Negative for light-headedness and headaches.  Hematological: Does not bruise/bleed easily.  Psychiatric/Behavioral: Negative.       Objective:    Physical Exam  Constitutional: She is oriented to person, place, and time. She appears well-developed and well-nourished. No distress.  HENT:  Head: Normocephalic and atraumatic.  Right Ear: External ear normal.  Left Ear: External ear normal.  Mouth/Throat: Oropharynx is clear and moist. No oropharyngeal exudate.  Eyes: Pupils are equal, round, and reactive to light. Conjunctivae are normal. Right eye exhibits no discharge. Left eye exhibits no discharge. No scleral icterus.  Neck: Neck supple. No JVD present. No tracheal deviation present. No thyromegaly present.  Cardiovascular: Normal rate, regular rhythm and normal heart sounds.  Pulmonary/Chest: Effort normal and breath sounds normal. No stridor.  Lymphadenopathy:    She has no cervical adenopathy.  Neurological: She is alert and oriented to person, place, and time.  Skin: Skin is warm and dry. She is not diaphoretic.  Psychiatric: She has a normal mood and affect. Her behavior is normal.    BP 120/78   Pulse 63   Temp (!) 97.4 F (36.3 C) (Oral)   Ht 5\' 2"  (1.575 m)   Wt 129 lb 8 oz (58.7 kg)   SpO2 98%   BMI 23.69 kg/m  Wt Readings from Last 3 Encounters:  03/13/19 129 lb 8 oz (58.7 kg)  10/11/18 131 lb 12.8 oz (59.8 kg)  09/22/18 128 lb 9.6 oz (58.3 kg)   BP Readings from Last 3 Encounters:  03/13/19 120/78  10/11/18 (!) 156/85   09/22/18 (!) 151/79   Guideline developer:  UpToDate (see UpToDate for funding source) Date Released: June 2014  Health Maintenance Due  Topic Date Due  . FOOT EXAM  10/22/1948  . OPHTHALMOLOGY EXAM  10/22/1948  . TETANUS/TDAP  10/22/1957  . PNA vac Low Risk Adult (1 of 2 - PCV13) 10/23/2003  . INFLUENZA VACCINE  07/28/2018  . HEMOGLOBIN A1C  03/07/2019    There are no preventive care reminders to  display for this patient.  Lab Results  Component Value Date   TSH 2.29 09/06/2018   Lab Results  Component Value Date   WBC 5.4 09/06/2018   HGB 13.2 09/06/2018   HCT 40.3 09/06/2018   MCV 81.7 09/06/2018   PLT 263.0 09/06/2018   Lab Results  Component Value Date   NA 141 09/06/2018   K 4.9 09/06/2018   CO2 28 09/06/2018   GLUCOSE 132 (H) 09/06/2018   BUN 12 09/06/2018   CREATININE 0.99 09/06/2018   BILITOT 0.5 09/06/2018   ALKPHOS 54 09/06/2018   AST 17 09/06/2018   ALT 12 09/06/2018   PROT 7.8 09/06/2018   ALBUMIN 4.1 09/06/2018   CALCIUM 10.1 09/06/2018   GFR 69.43 09/06/2018   Lab Results  Component Value Date   CHOL 180 09/06/2018   Lab Results  Component Value Date   HDL 74.60 09/06/2018   Lab Results  Component Value Date   LDLCALC 90 09/06/2018   Lab Results  Component Value Date   TRIG 76.0 09/06/2018   Lab Results  Component Value Date   CHOLHDL 2 09/06/2018   Lab Results  Component Value Date   HGBA1C 6.5 09/06/2018      Assessment & Plan:   Problem List Items Addressed This Visit      Cardiovascular and Mediastinum   Hypertension - Primary   Relevant Medications   amLODipine (NORVASC) 10 MG tablet   metoprolol succinate (TOPROL-XL) 50 MG 24 hr tablet   Other Relevant Orders   CBC   Comprehensive metabolic panel   Urinalysis, Routine w reflex microscopic     Endocrine   Thyroid disease   Relevant Medications   levothyroxine (SYNTHROID, LEVOTHROID) 75 MCG tablet   metoprolol succinate (TOPROL-XL) 50 MG 24 hr tablet    Controlled type 2 diabetes mellitus without complication, without long-term current use of insulin ([facility])   Relevant Medications   metFORMIN (GLUCOPHAGE-XR) 500 MG 24 hr tablet   Other Relevant Orders   Comprehensive metabolic panel   Hemoglobin A1c   Urinalysis, Routine w reflex microscopic   Acquired hypothyroidism   Relevant Medications   levothyroxine (SYNTHROID, LEVOTHROID) 75 MCG tablet   metoprolol succinate (TOPROL-XL) 50 MG 24 hr tablet   Other Relevant Orders   TSH      Meds ordered this encounter  Medications  . amLODipine (NORVASC) 10 MG tablet    Sig: Take 1 tablet (10 mg total) by mouth daily.    Dispense:  90 tablet    Refill:  1  . levothyroxine (SYNTHROID, LEVOTHROID) 75 MCG tablet    Sig: TAKE 1 TABLET(75 MCG) BY MOUTH DAILY    Dispense:  90 tablet    Refill:  1  . metFORMIN (GLUCOPHAGE-XR) 500 MG 24 hr tablet    Sig: Take 1 tablet (500 mg total) by mouth at bedtime.    Dispense:  90 tablet    Refill:  1  . metoprolol succinate (TOPROL-XL) 50 MG 24 hr tablet    Sig: Take 1 tablet (50 mg total) by mouth daily.    Dispense:  90 tablet    Refill:  1    Follow-up: Return in about 6 months (around 09/13/2019).

## 2019-03-13 NOTE — Telephone Encounter (Signed)
I called pt yesterday and advised her that our office will be closed today and we will call her back to r/s when we are open. Pt verbalized understanding.

## 2019-03-13 NOTE — Telephone Encounter (Signed)
I called pt to get her rescheduled for a later day, possibly May, no answer on home line, just keeps ringing, will try again later. Mobile number is incorrect.

## 2019-03-14 ENCOUNTER — Encounter: Payer: Self-pay | Admitting: Family Medicine

## 2019-03-15 ENCOUNTER — Encounter: Payer: Self-pay | Admitting: Neurology

## 2019-03-15 NOTE — Telephone Encounter (Signed)
I called pt to get her rescheduled from 03/13/19. Pt is agreeable to an appt on 05/09/2019 at 10:00am, check in at 9:30am. Pt verbalized understanding of new appt date and time.

## 2019-03-19 NOTE — Progress Notes (Signed)
[facility] Note  03/20/2019     CHIEF COMPLAINT Patient presents for Retina Follow Up   HISTORY OF PRESENT ILLNESS: Alejandra Davis is a 83 y.o. female who presents to the clinic today for:   HPI    Retina Follow Up    Patient presents with  CRVO/BRVO.  In right eye.  Severity is moderate.  Duration of 4.5 weeks.  Since onset it is gradually improving.  I, the attending physician,  performed the HPI with the patient and updated documentation appropriately.          Comments    Patient states vision still blurred OD, but some overall improvement. Hasn't checked BS recently.        Last edited by [name], MD on 03/20/2019 10:35 PM. (History)    pt states she has not been using any drops for the past week    Lab Results  Component Value Date   HGBA1C 6.9 (H) 03/13/2019     Referring physician: Libby Maw, MD [place], [place] [id number]  HISTORICAL INFORMATION:   Selected notes from the MEDICAL RECORD NUMBER Referred by [name] for DM exam LEE-  Ocular Hx-  PMH- HTN, thyroid disease, DM (A1C- 6.5)    CURRENT MEDICATIONS: Current Outpatient Medications (Ophthalmic Drugs)  Medication Sig  . dorzolamide-timolol (COSOPT) 22.3-6.8 MG/ML ophthalmic solution Place 1 drop into the right eye 2 (two) times daily.  Marland Kitchen ketorolac (ACULAR) 0.5 % ophthalmic solution Place 1 drop into the right eye 4 (four) times daily.  . prednisoLONE acetate (PRED FORTE) 1 % ophthalmic suspension Place 1 drop into the right eye 4 (four) times daily.   No current facility-administered medications for this visit.  (Ophthalmic Drugs)   Current Outpatient Medications (Other)  Medication Sig  . amLODipine (NORVASC) 10 MG tablet Take 1 tablet (10 mg total) by mouth daily.  . calcium-vitamin D (OSCAL 500/200 D-3) 500-200 MG-UNIT tablet Take 1 tablet by mouth 2 (two) times daily.  Marland Kitchen levothyroxine (SYNTHROID, LEVOTHROID) 75 MCG  tablet TAKE 1 TABLET(75 MCG) BY MOUTH DAILY  . metFORMIN (GLUCOPHAGE-XR) 500 MG 24 hr tablet Take 1 tablet (500 mg total) by mouth at bedtime.  . metoprolol succinate (TOPROL-XL) 50 MG 24 hr tablet Take 1 tablet (50 mg total) by mouth daily.  Marland Kitchen triamcinolone ointment (KENALOG) 0.1 % Apply 1 application topically 2 (two) times daily. For rash on thighs and arms.   Current Facility-Administered Medications (Other)  Medication Route  . Bevacizumab (AVASTIN) SOLN 1.25 mg Intravitreal  . Bevacizumab (AVASTIN) SOLN 1.25 mg Intravitreal  . Bevacizumab (AVASTIN) SOLN 1.25 mg Intravitreal  . Bevacizumab (AVASTIN) SOLN 1.25 mg Intravitreal      REVIEW OF SYSTEMS: ROS    Positive for: Endocrine, Cardiovascular, Eyes   Negative for: Constitutional, Gastrointestinal, Neurological, Skin, Genitourinary, Musculoskeletal, HENT, Respiratory, Psychiatric, Allergic/Imm, Heme/Lymph   Last edited by Roselee Nova D on 03/20/2019  9:14 AM. (History)       ALLERGIES No Known Allergies  PAST MEDICAL HISTORY Past Medical History:  Diagnosis Date  . Chicken pox   . Cystocele   . Diabetes mellitus without complication ([place])   . Hypertension   . Thyroid disease    hypothyroidism   Past Surgical History:  Procedure Laterality Date  . ABDOMINAL HYSTERECTOMY  1990   TAH BSO  . OOPHORECTOMY     BSO  . Vaginal Bx     Papilloma  FAMILY HISTORY Family History  Problem Relation Age of Onset  . Sickle cell anemia Other   . Hypertension Mother   . Cancer Father        LIVER  . Heart disease Brother   . Cancer Brother        STOMACH    SOCIAL HISTORY Social History   Tobacco Use  . Smoking status: Never Smoker  . Smokeless tobacco: Never Used  Substance Use Topics  . Alcohol use: No  . Drug use: No         OPHTHALMIC EXAM:  Base Eye Exam    Visual Acuity (Snellen - Linear)      Right Left   Dist cc 20/50 -2 20/30 -2   Dist ph cc 20/30 -2 20/25   Correction:  Glasses        Tonometry (Tonopen, 9:25 AM)      Right Left   Pressure 17 13       Pupils      Dark Light Shape React APD   Right 3 2 Round Brisk None   Left 3 2 Round Brisk None       Visual Fields (Counting fingers)      Left Right    Full Full       Extraocular Movement      Right Left    Full, Ortho Full, Ortho       Neuro/Psych    Oriented x3:  Yes   Mood/Affect:  Normal       Dilation    Both eyes:  1.0% Mydriacyl, 2.5% Phenylephrine @ 9:25 AM        Slit Lamp and Fundus Exam    Slit Lamp Exam      Right Left   Lids/Lashes Dermatochalasis - upper lid, Meibomian gland dysfunction Dermatochalasis - upper lid, Meibomian gland dysfunction   Conjunctiva/Sclera Melanosis Melanosis   Cornea Arcus, 2+ Punctate epithelial erosions, mild Anterior basement membrane dystrophy, Well healed cataract wounds, tr Descemet's folds tempoally Arcus, Inferior 1+ Punctate epithelial erosions; 2-3+ microcystic edema temporal with 2+ descemet folds, Keratic precipitates.  Well healed temporal cat sx incision.   Anterior Chamber Deep, no cell or flare Deep;  no cell or flare   Iris Round and dilated, No NVI Round and dilated, No NVI   Lens PC IOL in good position Posterior chamber intraocular lens in good position.  1+ PCO.   Vitreous Vitreous syneresis Vitreous syneresis       Fundus Exam      Right Left   Disc Sharp rim, inferior notch, 1+ pallor, inf. Rim thinning, mild disc heme at 10:30 trace pallor   C/D Ratio 0.7 0.6   Macula Blunted foveal reflex, IRH superior macula Blunted foveal reflex, flat, RPE mottling, Epiretinal membrane, No heme or edema   Vessels Severe vascular attenuation superiorly, copper wiring, dilated superior veins, positive A/V Crossing changes. Superior BVO OD. Mild vascular attenuation, AV crossing changes   Periphery Attached, superior DBH Attached, scattered Reticular degeneration          IMAGING AND PROCEDURES  Imaging and Procedures for 03/07/18  OCT, Retina  - OU - Both Eyes       Right Eye Quality was borderline. Central Foveal Thickness: 324. Progression has worsened. Findings include abnormal foveal contour, no SRF, epiretinal membrane, intraretinal fluid (Interval increase in IRF).   Left Eye Quality was borderline. Central Foveal Thickness: 262. Progression has been stable. Findings include abnormal foveal contour, epiretinal membrane,  no IRF, no SRF, vitreomacular adhesion  (Blunted foveal reflex).   Notes *Images captured and stored on drive  Diagnosis / Impression:  ERM OU OD: interval increase in central IRF/CME OS: very mild ERM with blunted foveal reflex -- stable; mild VMA  Clinical management:  See below  Abbreviations: NFP - Normal foveal profile. CME - cystoid macular edema. PED - pigment epithelial detachment. IRF - intraretinal fluid. SRF - subretinal fluid. EZ - ellipsoid zone. ERM - epiretinal membrane. ORA - outer retinal atrophy. ORT - outer retinal tubulation. SRHM - subretinal hyper-reflective material         Intravitreal Injection, Pharmacologic Agent - OD - Right Eye       Time Out 03/20/2019. 10:42 AM. Confirmed correct patient, procedure, site, and patient consented.   Anesthesia Topical anesthesia was used. Anesthetic medications included Tetracaine 0.5%, Lidocaine 2%.   Procedure Preparation included 5% betadine to ocular surface, eyelid speculum. A 30 gauge needle was used.   Injection:  1.25 mg Bevacizumab (AVASTIN) SOLN   NDC: 09323-557-32, Lot: [id number] , Expiration date: 04/26/2019   Route: Intravitreal, Site: Right Eye, Waste: 0 mL  Post-op Post injection exam found visual acuity of at least counting fingers. The patient tolerated the procedure well. There were no complications. The patient received written and verbal post procedure care education.                 ASSESSMENT/PLAN:    ICD-10-CM   1. Branch retinal vein occlusion of right eye with macular edema H34.8310  Intravitreal Injection, Pharmacologic Agent - OD - Right Eye    Bevacizumab (AVASTIN) SOLN 1.25 mg  2. Diabetes mellitus type 2 without retinopathy ([place]) E11.9   3. Epiretinal membrane (ERM) of both eyes H35.373   4. Retinal edema H35.81 OCT, Retina - OU - Both Eyes  5. Pseudophakia of both eyes Z96.1   6. Ocular hypertension of right eye H40.051   7. Glaucoma suspect of right eye H40.001    1. BRVO with CME OD - at presentation, BCVA 20/60 OD -- down from 20/30 - initial OCT w/ CME superior macula - s/p IVA OD #1 (10.18.19), #2 (11.19.19), #3 (12.17.19), #4 (02.19.20) - good response to medications, but held IVA in January due to recent cataract surgery - today, BCVA 20/30 OD and OCT w/ interval increase in central IRF - recommend IVA OD #5 today (03.23.2020) - RBA of procedure discussed, questions answered - informed consent obtained and signed - see procedure note - F/U 4 -5 weeks -- DFE/OCT/possible injection  2. Diabetes mellitus, type 2 without retinopathy - The incidence, risk factors for progression, natural history and treatment options for diabetic retinopathy  were discussed with patient.   - The need for close monitoring of blood glucose, blood pressure, and serum lipids, avoiding cigarette or any type of tobacco, and the need for long term follow up was also discussed with patient.  3,4. Epiretinal membrane, OU- The natural history, anatomy, potential for loss of vision, and treatment options including vitrectomy techniques and the complications of endophthalmitis, retinal detachment, vitreous hemorrhage, cataract progression and permanent vision loss discussed with the patient. - relatively mild ERM OU -- blunted central foveal depression - OD with mild cystic changes 2/2 BRVO as above; OS without cystic changes or edema - discussed findings and prognosis - recommend monitoring for now  5. Pseudophakia OU  - s/p CE/IOL OS 11.13.19 w/ Dr. 11.26.19  - s/p CE/IOL OD  01.16.20 w/ Dr. 01.29.20  - beautiful  surgeries -- IOLs in perfect position  - healing well post-operatively  - increase in IRF/CME OD today may be partly due to post op CME ([place]) in addition to BRVO  - recommend restarting PF and ketorolac QID OD  6,7. Ocular hypertension / Glaucoma suspect OD>OS-  - IOP ~25 OD, 20 OS at both prior clinic visits - today IOP improved to 11 OS, OD is 17-- on cosopt BID OU - denies any family hx of glaucoma - under the expert care of Dr. [name] who is actively managing - cont Cosopt BID OU  Ophthalmic Meds Ordered this visit:  Meds ordered this encounter  Medications  . prednisoLONE acetate (PRED FORTE) 1 % ophthalmic suspension    Sig: Place 1 drop into the right eye 4 (four) times daily.    Dispense:  10 mL    Refill:  2  . ketorolac (ACULAR) 0.5 % ophthalmic solution    Sig: Place 1 drop into the right eye 4 (four) times daily.    Dispense:  10 mL    Refill:  2  . Bevacizumab (AVASTIN) SOLN 1.25 mg       Return in about 4 weeks (around 04/17/2019) for f/u BRVO OD, DFE, OCT.  There are no Patient Instructions on file for this visit.   Explained the diagnoses, plan, and follow up with the patient and they expressed understanding.  Patient expressed understanding of the importance of proper follow up care.   This document serves as a record of services personally performed by Gardiner Sleeper, MD, PhD. It was created on their behalf by Ernest Mallick, OA, an ophthalmic assistant. The creation of this record is the provider's dictation and/or activities during the visit.    Electronically signed by: Ernest Mallick, OA  03.22.2020 10:42 PM    Gardiner Sleeper, M.D., Ph.D. Diseases & Surgery of the Retina and Vitreous [facility]  I have reviewed the above documentation for accuracy and completeness, and I agree with the above. Gardiner Sleeper, M.D., Ph.D. 03/20/19 10:42 PM    Abbreviations: M myopia (nearsighted); A  astigmatism; H hyperopia (farsighted); P presbyopia; Mrx spectacle prescription;  CTL contact lenses; OD right eye; OS left eye; OU both eyes  XT exotropia; ET esotropia; PEK punctate epithelial keratitis; PEE punctate epithelial erosions; DES dry eye syndrome; MGD meibomian gland dysfunction; ATs artificial tears; PFAT's preservative free artificial tears; [place] nuclear sclerotic cataract; PSC posterior subcapsular cataract; ERM epi-retinal membrane; PVD posterior vitreous detachment; RD retinal detachment; DM diabetes mellitus; DR diabetic retinopathy; NPDR non-proliferative diabetic retinopathy; PDR proliferative diabetic retinopathy; CSME clinically significant macular edema; DME diabetic macular edema; dbh dot blot hemorrhages; CWS cotton wool spot; POAG primary open angle glaucoma; C/D cup-to-disc ratio; HVF humphrey visual field; GVF goldmann visual field; OCT optical coherence tomography; IOP intraocular pressure; BRVO Branch retinal vein occlusion; CRVO central retinal vein occlusion; CRAO central retinal artery occlusion; BRAO branch retinal artery occlusion; RT retinal tear; SB scleral buckle; PPV pars plana vitrectomy; VH Vitreous hemorrhage; PRP panretinal laser photocoagulation; IVK intravitreal kenalog; VMT vitreomacular traction; MH Macular hole;  NVD neovascularization of the disc; NVE neovascularization elsewhere; AREDS age related eye disease study; ARMD age related macular degeneration; POAG primary open angle glaucoma; EBMD epithelial/anterior basement membrane dystrophy; ACIOL anterior chamber intraocular lens; IOL intraocular lens; PCIOL posterior chamber intraocular lens; Phaco/IOL phacoemulsification with intraocular lens placement; [place] photorefractive keratectomy; LASIK laser assisted in situ keratomileusis; HTN hypertension; DM diabetes mellitus; COPD chronic obstructive pulmonary  disease

## 2019-03-20 ENCOUNTER — Ambulatory Visit (INDEPENDENT_AMBULATORY_CARE_PROVIDER_SITE_OTHER): Payer: Medicare Other | Admitting: Ophthalmology

## 2019-03-20 ENCOUNTER — Other Ambulatory Visit: Payer: Self-pay

## 2019-03-20 ENCOUNTER — Encounter (INDEPENDENT_AMBULATORY_CARE_PROVIDER_SITE_OTHER): Payer: Medicare Other | Admitting: Ophthalmology

## 2019-03-20 ENCOUNTER — Encounter (INDEPENDENT_AMBULATORY_CARE_PROVIDER_SITE_OTHER): Payer: Self-pay | Admitting: Ophthalmology

## 2019-03-20 DIAGNOSIS — H35373 Puckering of macula, bilateral: Secondary | ICD-10-CM | POA: Diagnosis not present

## 2019-03-20 DIAGNOSIS — E119 Type 2 diabetes mellitus without complications: Secondary | ICD-10-CM | POA: Diagnosis not present

## 2019-03-20 DIAGNOSIS — H40051 Ocular hypertension, right eye: Secondary | ICD-10-CM

## 2019-03-20 DIAGNOSIS — H3581 Retinal edema: Secondary | ICD-10-CM

## 2019-03-20 DIAGNOSIS — H34831 Tributary (branch) retinal vein occlusion, right eye, with macular edema: Secondary | ICD-10-CM

## 2019-03-20 DIAGNOSIS — Z961 Presence of intraocular lens: Secondary | ICD-10-CM

## 2019-03-20 DIAGNOSIS — H40001 Preglaucoma, unspecified, right eye: Secondary | ICD-10-CM

## 2019-03-20 MED ORDER — BEVACIZUMAB CHEMO INJECTION 1.25MG/0.05ML SYRINGE FOR KALEIDOSCOPE
1.2500 mg | INTRAVITREAL | Status: AC | PRN
  Administered 2019-03-20: 1.25 mg via INTRAVITREAL

## 2019-03-20 MED ORDER — PREDNISOLONE ACETATE 1 % OP SUSP: 1.0000 [drp] | Freq: Four times a day (QID) | OPHTHALMIC | 2 refills | Status: DC

## 2019-03-20 MED ORDER — KETOROLAC TROMETHAMINE 0.5 % OP SOLN: 1.0000 [drp] | Freq: Four times a day (QID) | OPHTHALMIC | 2 refills | Status: DC

## 2019-03-31 ENCOUNTER — Ambulatory Visit (INDEPENDENT_AMBULATORY_CARE_PROVIDER_SITE_OTHER): Payer: Medicare Other | Admitting: Family Medicine

## 2019-03-31 ENCOUNTER — Other Ambulatory Visit: Payer: Self-pay

## 2019-03-31 ENCOUNTER — Encounter: Payer: Self-pay | Admitting: Family Medicine

## 2019-03-31 ENCOUNTER — Telehealth: Payer: Self-pay | Admitting: Behavioral Health

## 2019-03-31 VITALS — BP 140/86 | HR 80 | Temp 97.9°F | Ht 62.0 in | Wt 131.0 lb

## 2019-03-31 DIAGNOSIS — R600 Localized edema: Secondary | ICD-10-CM | POA: Diagnosis not present

## 2019-03-31 DIAGNOSIS — R238 Other skin changes: Secondary | ICD-10-CM | POA: Diagnosis not present

## 2019-03-31 NOTE — Progress Notes (Signed)
Alejandra Davis is a 83 y.o. female  Chief Complaint  Patient presents with  . Cyst    lower back found knot hard/ found yesterday    HPI: Alejandra Davis is a 83 y.o. female who is a patient of Dr. Ethelene Hal and complains of a "small hard knot" on her lower back that she noticed yesterday. She is unsure of how long it has been there. No pain. No itch. No fever, chills. No drainage from area that she is aware of.   She also notes some swelling in both feet/ankles x 2-3 days. No SOB, CP, palpitations, DOE, orthopnea. She states she has not been walking her normal 3-4x/wk at the [facility] like she usually does/ she admits to sitting and watching tv much more than normal and has been eating more soup (higher sodium) than she normally does. No pain. No redness. No warmth. No issues walking.  Past Medical History:  Diagnosis Date  . Chicken pox   . Cystocele   . Diabetes mellitus without complication ([place])   . Hypertension   . Thyroid disease    hypothyroidism    Past Surgical History:  Procedure Laterality Date  . ABDOMINAL HYSTERECTOMY  1990   TAH BSO  . OOPHORECTOMY     BSO  . Vaginal Bx     Papilloma    Social History   Socioeconomic History  . Marital status: Divorced    Spouse name: Not on file  . Number of children: Not on file  . Years of education: Not on file  . Highest education level: Not on file  Occupational History  . Not on file  Social Needs  . Financial resource strain: Not on file  . Food insecurity:    Worry: Not on file    Inability: Not on file  . Transportation needs:    Medical: Not on file    Non-medical: Not on file  Tobacco Use  . Smoking status: Never Smoker  . Smokeless tobacco: Never Used  Substance and Sexual Activity  . Alcohol use: No  . Drug use: No  . Sexual activity: Not on file  Lifestyle  . Physical activity:    Days per week: Not on file    Minutes per session: Not on file  . Stress: Not on file  Relationships  . Social  connections:    Talks on phone: Not on file    Gets together: Not on file    Attends religious service: Not on file    Active member of club or organization: Not on file    Attends meetings of clubs or organizations: Not on file    Relationship status: Not on file  . Intimate partner violence:    Fear of current or ex partner: Not on file    Emotionally abused: Not on file    Physically abused: Not on file    Forced sexual activity: Not on file  Other Topics Concern  . Not on file  Social History Narrative  . Not on file    Family History  Problem Relation Age of Onset  . Sickle cell anemia Other   . Hypertension Mother   . Cancer Father        LIVER  . Heart disease Brother   . Cancer Brother        STOMACH     Immunization History  Administered Date(s) Administered  . Influenza-Unspecified 12/20/2017  . Zoster Recombinat (Shingrix) 02/07/2018    Outpatient  Encounter Medications as of 03/31/2019  Medication Sig  . amLODipine (NORVASC) 10 MG tablet Take 1 tablet (10 mg total) by mouth daily.  . calcium-vitamin D (OSCAL 500/200 D-3) 500-200 MG-UNIT tablet Take 1 tablet by mouth 2 (two) times daily.  . dorzolamide-timolol (COSOPT) 22.3-6.8 MG/ML ophthalmic solution Place 1 drop into the right eye 2 (two) times daily.  Marland Kitchen ketorolac (ACULAR) 0.5 % ophthalmic solution Place 1 drop into the right eye 4 (four) times daily.  Marland Kitchen levothyroxine (SYNTHROID, LEVOTHROID) 75 MCG tablet TAKE 1 TABLET(75 MCG) BY MOUTH DAILY  . metFORMIN (GLUCOPHAGE-XR) 500 MG 24 hr tablet Take 1 tablet (500 mg total) by mouth at bedtime.  . metoprolol succinate (TOPROL-XL) 50 MG 24 hr tablet Take 1 tablet (50 mg total) by mouth daily.  . prednisoLONE acetate (PRED FORTE) 1 % ophthalmic suspension Place 1 drop into the right eye 4 (four) times daily.  Marland Kitchen triamcinolone ointment (KENALOG) 0.1 % Apply 1 application topically 2 (two) times daily. For rash on thighs and arms.   Facility-Administered Encounter  Medications as of 03/31/2019  Medication  . Bevacizumab (AVASTIN) SOLN 1.25 mg  . Bevacizumab (AVASTIN) SOLN 1.25 mg  . Bevacizumab (AVASTIN) SOLN 1.25 mg  . Bevacizumab (AVASTIN) SOLN 1.25 mg     ROS: Pertinent positives and negatives noted in HPI. Remainder of ROS non-contributory    No Known Allergies  BP 140/86   Pulse 80   Temp 97.9 F (36.6 C) (Oral)   Ht 5\' 2"  (1.575 m)   Wt 131 lb (59.4 kg)   SpO2 97%   BMI 23.96 kg/m   BP Readings from Last 3 Encounters:  03/31/19 140/86  03/13/19 120/78  10/11/18 (!) 156/85     Physical Exam  Constitutional: She is oriented to person, place, and time. She appears well-developed and well-nourished. She appears distressed.  Neck: Neck supple. No JVD present.  Cardiovascular: Normal rate and normal heart sounds.  Pulmonary/Chest: Effort normal and breath sounds normal. She has no wheezes. She has no rhonchi. She has no rales.  Musculoskeletal:        General: Edema (trace b/l pedal edema) present. No tenderness.  Neurological: She is alert and oriented to person, place, and time.  Skin:  Lt lower back with solitary, small papule; non-tender, no erythema, no warmth to touch, no fluctuance or induration. Measures approx 0.4cm x 0.8cm.   Psychiatric: She has a normal mood and affect. Her behavior is normal.     A/P:  1. Papule of skin - no sign of infection, no concerning findings on exam - no erythema, TTP, warmth, drainage - looks like a small pimple or similar that will resolve spontaneously  - advised pt to use warm compress BID x a few days  - f/u if symptoms worsen or do not improve/resolve in 2 wks  2. Bilateral lower extremity edema - trace pedal edema - advised pt to keep feet/legs elevated, decrease sodium intake, walk outside (she was previously walking 3-4x/wk at the [facility] but has not been able to x 2 wks) - f/u if swelling worsens pt will f/u with PCP or myself

## 2019-03-31 NOTE — Patient Instructions (Signed)
For your back, you can do nothing or can use a warm compress 2x/day for 10-15 min for a few days  For your feet, try to get back to walking more and also keep your feet elevated when sitting and watching tv and cut back on salt/sodium intake

## 2019-03-31 NOTE — Telephone Encounter (Signed)
Questions for Screening COVID-19  Symptom onset: None  Travel or Contacts: No recent travel  During this illness, did/does the patient experience any of the following symptoms? Fever >100.40F []   Yes [x]   No []   Unknown Subjective fever (felt feverish) []   Yes [x]   No []   Unknown Chills []   Yes [x]   No []   Unknown Muscle aches (myalgia) []   Yes [x]   No []   Unknown Runny nose (rhinorrhea) []   Yes [x]   No []   Unknown Sore throat []   Yes [x]   No []   Unknown Cough (new onset or worsening of chronic cough) []   Yes [x]   No []   Unknown Shortness of breath (dyspnea) []   Yes [x]   No []   Unknown Nausea or vomiting []   Yes [x]   No []   Unknown Headache []   Yes []   No [x]   Unknown Abdominal pain  []   Yes [x]   No []   Unknown Diarrhea (?3 loose/looser than normal stools/24hr period) []   Yes [x]   No []   Unknown Other, specify:  Patient risk factors: Smoker? []   Current []   Former []   Never If female, currently pregnant? []   Yes [x]   No  Patient Active Problem List   Diagnosis Date Noted  . Acquired hypothyroidism 03/13/2019  . Murmur 09/01/2018  . Telogen effluvium 06/07/2018  . Eczema of scalp 03/31/2018  . Xerosis cutis 02/25/2018  . Immunization reaction 02/08/2018  . Keratosis pilaris 02/08/2018  . Health care maintenance 02/07/2018  . Controlled type 2 diabetes mellitus without complication, without long-term current use of insulin ([place]) 02/07/2018  . Cystocele   . Osteopenia   . Thyroid disease   . Hypertension     Plan:  []   High risk for COVID-19 with red flags go to ED (with CP, SOB, weak/lightheaded, or fever > 101.5). Call ahead.  []   High risk for COVID-19 but stable. Inform provider and coordinate time for [facility] visit.   []   No red flags but URI signs or symptoms okay for [facility] visit.

## 2019-04-01 ENCOUNTER — Encounter (HOSPITAL_COMMUNITY): Payer: Self-pay | Admitting: Emergency Medicine

## 2019-04-01 ENCOUNTER — Emergency Department (HOSPITAL_COMMUNITY)
Admission: EM | Admit: 2019-04-01 | Discharge: 2019-04-01 | Disposition: A | Discharge status: Home / Self Care | Payer: Medicare Other | Attending: Emergency Medicine | Admitting: Emergency Medicine

## 2019-04-01 ENCOUNTER — Other Ambulatory Visit: Payer: Self-pay

## 2019-04-01 DIAGNOSIS — J392 Other diseases of pharynx: Secondary | ICD-10-CM | POA: Diagnosis not present

## 2019-04-01 DIAGNOSIS — I10 Essential (primary) hypertension: Secondary | ICD-10-CM | POA: Insufficient documentation

## 2019-04-01 DIAGNOSIS — J029 Acute pharyngitis, unspecified: Secondary | ICD-10-CM | POA: Diagnosis present

## 2019-04-01 DIAGNOSIS — Z79899 Other long term (current) drug therapy: Secondary | ICD-10-CM | POA: Insufficient documentation

## 2019-04-01 DIAGNOSIS — E119 Type 2 diabetes mellitus without complications: Secondary | ICD-10-CM | POA: Diagnosis not present

## 2019-04-01 DIAGNOSIS — Z7984 Long term (current) use of oral hypoglycemic drugs: Secondary | ICD-10-CM | POA: Insufficient documentation

## 2019-04-01 DIAGNOSIS — E039 Hypothyroidism, unspecified: Secondary | ICD-10-CM | POA: Insufficient documentation

## 2019-04-01 NOTE — ED Triage Notes (Signed)
Pt has had sore throat for 1 hour while working in yard. Denies fever SOB or sick contacts.

## 2019-04-01 NOTE — ED Notes (Signed)
Patient verbalizes understanding of discharge instructions. Opportunity for questioning and answers were provided. Armband removed by staff, pt discharged from ED.  

## 2019-04-02 NOTE — ED Provider Notes (Signed)
[facility] EMERGENCY DEPARTMENT Provider Note   CSN: [id number] Arrival date & time: 04/01/19  1405    History   Chief Complaint Chief Complaint  Patient presents with   Sore Throat    HPI Alejandra Davis is a 83 y.o. female.     HPI   82yF with scratchy throat. Onset 1 hour ago. Not really painful. Was out in yard when she noticed it. No new exposures that she is aware of. No cough. No dyspnea. No fever.   Past Medical History:  Diagnosis Date   Chicken pox    Cystocele    Diabetes mellitus without complication ([place])    Hypertension    Thyroid disease    hypothyroidism    Patient Active Problem List   Diagnosis Date Noted   Acquired hypothyroidism 03/13/2019   Murmur 09/01/2018   Telogen effluvium 06/07/2018   Eczema of scalp 03/31/2018   Xerosis cutis 02/25/2018   Immunization reaction 02/08/2018   Keratosis pilaris 02/08/2018   Health care maintenance 02/07/2018   Controlled type 2 diabetes mellitus without complication, without long-term current use of insulin ([place]) 02/07/2018   Cystocele    Osteopenia    Thyroid disease    Hypertension     Past Surgical History:  Procedure Laterality Date   ABDOMINAL HYSTERECTOMY  1990   TAH BSO   OOPHORECTOMY     BSO   Vaginal Bx     Papilloma     OB History    Gravida  3   Para  2   Term  2   Preterm      AB  1   Living  2     SAB  1   TAB      Ectopic      Multiple      Live Births               Home Medications    Prior to Admission medications   Medication Sig Start Date End Date Taking? Authorizing Provider  amLODipine (NORVASC) 10 MG tablet Take 1 tablet (10 mg total) by mouth daily. 03/13/19   Libby Maw, MD  calcium-vitamin D (OSCAL 500/200 D-3) 500-200 MG-UNIT tablet Take 1 tablet by mouth 2 (two) times daily. 09/01/18   Libby Maw, MD  dorzolamide-timolol (COSOPT) 22.3-6.8 MG/ML ophthalmic solution Place 1 drop  into the right eye 2 (two) times daily. 07/06/18 07/06/19  [name], MD  ketorolac (ACULAR) 0.5 % ophthalmic solution Place 1 drop into the right eye 4 (four) times daily. 03/20/19   [name], MD  levothyroxine (SYNTHROID, LEVOTHROID) 75 MCG tablet TAKE 1 TABLET(75 MCG) BY MOUTH DAILY 03/13/19   Libby Maw, MD  metFORMIN (GLUCOPHAGE-XR) 500 MG 24 hr tablet Take 1 tablet (500 mg total) by mouth at bedtime. 03/13/19   Libby Maw, MD  metoprolol succinate (TOPROL-XL) 50 MG 24 hr tablet Take 1 tablet (50 mg total) by mouth daily. 03/13/19   Libby Maw, MD  prednisoLONE acetate (PRED FORTE) 1 % ophthalmic suspension Place 1 drop into the right eye 4 (four) times daily. 03/20/19   [name], MD  triamcinolone ointment (KENALOG) 0.1 % Apply 1 application topically 2 (two) times daily. For rash on thighs and arms. 06/29/18   Libby Maw, MD    Family History Family History  Problem Relation Age of Onset   Sickle cell anemia Other    Hypertension Mother    Cancer Father  LIVER   Heart disease Brother    Cancer Brother        STOMACH    Social History Social History   Tobacco Use   Smoking status: Never Smoker   Smokeless tobacco: Never Used  Substance Use Topics   Alcohol use: No   Drug use: No     Allergies   Patient has no known allergies.   Review of Systems Review of Systems  All systems reviewed and negative, other than as noted in HPI.  Physical Exam Updated Vital Signs BP (!) 159/110 (BP Location: Right Arm)    Pulse (!) 103    Temp 97.9 F (36.6 C) (Oral)    Resp 18    SpO2 99%   Physical Exam Vitals signs and nursing note reviewed.  Constitutional:      General: She is not in acute distress.    Appearance: She is well-developed.  HENT:     Head: Normocephalic and atraumatic.     Mouth/Throat:     Mouth: Mucous membranes are moist. No oral lesions.     Pharynx: Oropharynx is clear. Uvula midline.  No pharyngeal swelling, oropharyngeal exudate, posterior oropharyngeal erythema or uvula swelling.  Eyes:     General:        Right eye: No discharge.        Left eye: No discharge.     Conjunctiva/sclera: Conjunctivae normal.  Neck:     Musculoskeletal: Neck supple.  Cardiovascular:     Rate and Rhythm: Normal rate and regular rhythm.     Heart sounds: Normal heart sounds. No murmur. No friction rub. No gallop.   Pulmonary:     Effort: Pulmonary effort is normal. No respiratory distress.     Breath sounds: Normal breath sounds.  Abdominal:     General: There is no distension.     Palpations: Abdomen is soft.     Tenderness: There is no abdominal tenderness.  Musculoskeletal:        General: No tenderness.  Skin:    General: Skin is warm and dry.  Neurological:     Mental Status: She is alert.  Psychiatric:        Behavior: Behavior normal.        Thought Content: Thought content normal.      ED Treatments / Results  Labs (all labs ordered are listed, but only abnormal results are displayed) Labs Reviewed - No data to display  EKG None  Radiology No results found.  Procedures Procedures (including critical care time)  Medications Ordered in ED Medications - No data to display   Initial Impression / Assessment and Plan / ED Course  I have reviewed the triage vital signs and the nursing notes.  Pertinent labs & imaging results that were available during my care of the patient were reviewed by me and considered in my medical decision making (see chart for details).        82yF with scratchy throat. Exam unremarkable. She is concerned about COVID-19. Clinically I doubt. More likely seasonal allergies. Reassured. Return precautions discussed.   Final Clinical Impressions(s) / ED Diagnoses   Final diagnoses:  Throat irritation    ED Discharge Orders    None       Virgel Manifold, MD 04/02/19 1726

## 2019-04-06 ENCOUNTER — Telehealth: Payer: Self-pay | Admitting: Neurology

## 2019-04-06 NOTE — Telephone Encounter (Signed)
Called son and requested he call back to discuss video visit.

## 2019-04-06 NOTE — Telephone Encounter (Addendum)
Called patient and advised Due to current COVID 19 pandemic, our office is severely reducing in person visits in order to minimize the risk to our patients and healthcare providers. We recommend to convert your appointment to a video visit. She stated she would need assistance. She asked me to call her son, Alroy Dust. She gave number and asked to wait until she calls him to make him aware.

## 2019-04-06 NOTE — Telephone Encounter (Signed)
Since this patient is a new patient and Dr. Rexene Alberts would like to see this type of patient in office, could you call her and see if she would like to be scheduled with Dr. Leta Baptist?

## 2019-04-07 ENCOUNTER — Encounter: Payer: Self-pay | Admitting: Neurology

## 2019-04-10 NOTE — Telephone Encounter (Signed)
LVM for pt's son, Alroy Dust advising him the patient's current appt with [name] in May will be left as is. Hopefully she can be seen in the office at that time. Left number for any questions.

## 2019-04-13 NOTE — Progress Notes (Signed)
[facility] Note  04/18/2019     CHIEF COMPLAINT Patient presents for Retina Follow Up   HISTORY OF PRESENT ILLNESS: Alejandra Davis is a 83 y.o. female who presents to the clinic today for:   HPI    Retina Follow Up    Patient presents with  CRVO/BRVO.  In right eye.  This started 4 weeks ago.  Severity is moderate.  Duration of 4 weeks.  Since onset it is gradually improving.  I, the attending physician,  performed the HPI with the patient and updated documentation appropriately.          Comments    Patient here for 4 weeks retina follow up for BRVO with CME OD. Patient states vision doing ok. No eye pain.       Last edited by [name], MD on 04/18/2019 10:05 AM. (History)    pt states she feels like her vision is doing well, she states she sees okay    Lab Results  Component Value Date   HGBA1C 6.9 (H) 03/13/2019     Referring physician: Libby Maw, MD [place], [place] [address]  HISTORICAL INFORMATION:   Selected notes from the MEDICAL RECORD NUMBER Referred by [name] for DM exam LEE-  Ocular Hx-  PMH- HTN, thyroid disease, DM (A1C- 6.5)    CURRENT MEDICATIONS: Current Outpatient Medications (Ophthalmic Drugs)  Medication Sig  . dorzolamide-timolol (COSOPT) 22.3-6.8 MG/ML ophthalmic solution Place 1 drop into the right eye 2 (two) times daily.  Marland Kitchen ketorolac (ACULAR) 0.5 % ophthalmic solution Place 1 drop into the right eye 4 (four) times daily.  . prednisoLONE acetate (PRED FORTE) 1 % ophthalmic suspension Place 1 drop into the right eye 4 (four) times daily.   No current facility-administered medications for this visit.  (Ophthalmic Drugs)   Current Outpatient Medications (Other)  Medication Sig  . amLODipine (NORVASC) 10 MG tablet Take 1 tablet (10 mg total) by mouth daily.  . calcium-vitamin D (OSCAL 500/200 D-3) 500-200 MG-UNIT tablet Take 1 tablet by mouth 2 (two) times daily.  Marland Kitchen  levothyroxine (SYNTHROID, LEVOTHROID) 75 MCG tablet TAKE 1 TABLET(75 MCG) BY MOUTH DAILY  . metFORMIN (GLUCOPHAGE-XR) 500 MG 24 hr tablet Take 1 tablet (500 mg total) by mouth at bedtime.  . metoprolol succinate (TOPROL-XL) 50 MG 24 hr tablet Take 1 tablet (50 mg total) by mouth daily.  Marland Kitchen triamcinolone ointment (KENALOG) 0.1 % Apply 1 application topically 2 (two) times daily. For rash on thighs and arms.   Current Facility-Administered Medications (Other)  Medication Route  . Bevacizumab (AVASTIN) SOLN 1.25 mg Intravitreal  . Bevacizumab (AVASTIN) SOLN 1.25 mg Intravitreal  . Bevacizumab (AVASTIN) SOLN 1.25 mg Intravitreal  . Bevacizumab (AVASTIN) SOLN 1.25 mg Intravitreal      REVIEW OF SYSTEMS: ROS    Positive for: Endocrine, Cardiovascular, Eyes   Negative for: Constitutional, Gastrointestinal, Neurological, Skin, Genitourinary, Musculoskeletal, HENT, Respiratory, Psychiatric, Allergic/Imm, Heme/Lymph   Last edited by [name] on 04/18/2019  9:43 AM. (History)       ALLERGIES No Known Allergies  PAST MEDICAL HISTORY Past Medical History:  Diagnosis Date  . Chicken pox   . Cystocele   . Diabetes mellitus without complication ([place])   . Hypertension   . Thyroid disease    hypothyroidism   Past Surgical History:  Procedure Laterality Date  . ABDOMINAL HYSTERECTOMY  1990   TAH BSO  . OOPHORECTOMY  BSO  . Vaginal Bx     Papilloma    FAMILY HISTORY Family History  Problem Relation Age of Onset  . Sickle cell anemia Other   . Hypertension Mother   . Cancer Father        LIVER  . Heart disease Brother   . Cancer Brother        STOMACH    SOCIAL HISTORY Social History   Tobacco Use  . Smoking status: Never Smoker  . Smokeless tobacco: Never Used  Substance Use Topics  . Alcohol use: No  . Drug use: No         OPHTHALMIC EXAM:  Base Eye Exam    Visual Acuity (Snellen - Linear)      Right Left   Dist cc 20/40 -2 20/20 -2   Dist ph cc  NI    Correction:  Glasses       Tonometry (Tonopen, 9:40 AM)      Right Left   Pressure 15 13       Pupils      Dark Light Shape React APD   Right 3 2 Round Brisk None   Left 3 2 Round Brisk None       Visual Fields (Counting fingers)      Left Right    Full Full       Extraocular Movement      Right Left    Full, Ortho Full, Ortho       Neuro/Psych    Oriented x3:  Yes   Mood/Affect:  Normal       Dilation    Both eyes:  1.0% Mydriacyl, 2.5% Phenylephrine @ 9:40 AM        Slit Lamp and Fundus Exam    Slit Lamp Exam      Right Left   Lids/Lashes Dermatochalasis - upper lid, Meibomian gland dysfunction Dermatochalasis - upper lid, Meibomian gland dysfunction   Conjunctiva/Sclera Melanosis Melanosis   Cornea Arcus, 2+ Punctate epithelial erosions, mild Anterior basement membrane dystrophy, Well healed cataract wounds, tr Descemet's folds tempoally Arcus, Inferior 1+ Punctate epithelial erosions; 2-3+ microcystic edema temporal with 2+ descemet folds, Keratic precipitates.  Well healed temporal cat sx incision.   Anterior Chamber Deep, no cell or flare Deep;  no cell or flare   Iris Round and dilated, No NVI Round and dilated, No NVI   Lens PC IOL in good position Posterior chamber intraocular lens in good position.  1+ PCO.   Vitreous Vitreous syneresis Vitreous syneresis       Fundus Exam      Right Left   Disc Sharp rim, inferior notch, 1+ pallor, inf. Rim thinning, mild disc heme at 10:30 trace pallor   C/D Ratio 0.7 0.6   Macula Improved foveal reflex, IRH superior macula - improved Blunted foveal reflex, flat, RPE mottling, Epiretinal membrane, No heme or edema   Vessels Severe vascular attenuation superiorly, BRVO Mild vascular attenuation, AV crossing changes   Periphery Attached, superior DBH Attached, scattered Reticular degeneration        Refraction    Wearing Rx      Sphere Cylinder Axis Add   Right -0.75 +1.25 170 +2.50   Left -0.75 +1.00 003  +2.50   Type:  PAL          IMAGING AND PROCEDURES  Imaging and Procedures for 03/07/18  OCT, Retina - OU - Both Eyes       Right Eye Quality was good.  Central Foveal Thickness: 260. Progression has improved. Findings include abnormal foveal contour, no SRF, epiretinal membrane, normal foveal contour, no IRF (Interval improvement in IRF, trace cystic changes, blunted foveal contour).   Left Eye Quality was good. Central Foveal Thickness: 262. Progression has been stable. Findings include abnormal foveal contour, epiretinal membrane, no IRF, no SRF, vitreomacular adhesion  (Blunted foveal reflex).   Notes *Images captured and stored on drive  Diagnosis / Impression:  ERM OU OD: interval improvement in central IRF/CME OS: very mild ERM with blunted foveal reflex -- stable; mild VMA  Clinical management:  See below  Abbreviations: NFP - Normal foveal profile. CME - cystoid macular edema. PED - pigment epithelial detachment. IRF - intraretinal fluid. SRF - subretinal fluid. EZ - ellipsoid zone. ERM - epiretinal membrane. ORA - outer retinal atrophy. ORT - outer retinal tubulation. SRHM - subretinal hyper-reflective material         Intravitreal Injection, Pharmacologic Agent - OD - Right Eye       Time Out 04/18/2019. 11:18 AM. Confirmed correct patient, procedure, site, and patient consented.   Anesthesia Topical anesthesia was used. Anesthetic medications included Lidocaine 2%, Proparacaine 0.5%.   Procedure Preparation included 5% betadine to ocular surface, eyelid speculum. A supplied needle was used.   Injection:  1.25 mg Bevacizumab (AVASTIN) SOLN   NDC: 27035-009-38, Lot: [id number] , Expiration date: 05/17/2019   Route: Intravitreal, Site: Right Eye, Waste: 0 mL  Post-op Post injection exam found visual acuity of at least counting fingers. The patient tolerated the procedure well. There were no complications. The patient received written and verbal post  procedure care education.                 ASSESSMENT/PLAN:    ICD-10-CM   1. Branch retinal vein occlusion of right eye with macular edema H34.8310 Intravitreal Injection, Pharmacologic Agent - OD - Right Eye    Bevacizumab (AVASTIN) SOLN 1.25 mg  2. Diabetes mellitus type 2 without retinopathy ([place]) E11.9   3. Epiretinal membrane (ERM) of both eyes H35.373   4. Retinal edema H35.81 OCT, Retina - OU - Both Eyes  5. Pseudophakia of both eyes Z96.1   6. Ocular hypertension of right eye H40.051   7. Glaucoma suspect of right eye H40.001    1. BRVO with CME OD  - at presentation, BCVA 20/60 OD -- down from 20/30  - initial OCT w/ CME superior macula  - s/p IVA OD #1 (10.18.19), #2 (11.19.19), #3 (12.17.19), #4 (02.19.20), #5 (03.23.20)  - good response to medications, but held IVA in January due to recent cataract surgery  - today, BCVA 20/40 OD and OCT w/ interval improvement in IRF  - recommend IVA OD #6 today (04.21.2020) for maintenance with extension to 5-6 wks  - RBA of procedure discussed, questions answered  - informed consent obtained and signed  - see procedure note  - F/U 5-6 weeks -- DFE/OCT/possible injection  2. Diabetes mellitus, type 2 without retinopathy  - The incidence, risk factors for progression, natural history and treatment options for diabetic retinopathy  were discussed with patient.    - The need for close monitoring of blood glucose, blood pressure, and serum lipids, avoiding cigarette or any type of tobacco, and the need for long term follow up was also discussed with patient.  3,4. Epiretinal membrane, OU-  - relatively mild ERM OU -- blunted central foveal depression  - OD with mild cystic changes 2/2 BRVO as above; OS without cystic  changes or edema  - discussed findings and prognosis  - recommend monitoring for now  5. Pseudophakia OU  - s/p CE/IOL OS 11.13.19 w/ Dr. [name]  - s/p CE/IOL OD 01.16.20 w/ Dr. [name]  - beautiful surgeries --  IOLs in perfect position  - healing well post-operatively  - IRF/CME OD may have been partly due to post op CME ([place]) in addition to BRVO -- improved today  - stop ketorolac   - start PF taper -- 4,3,2,1 drops daily, decrease every 2 weeks  6,7. Ocular hypertension / Glaucoma suspect OD>OS-   - IOP ~25 OD, 20 OS at both prior clinic visits  - today IOP improved to 13 OS, OD is 15-- on cosopt BID OU  - denies any family hx of glaucoma  - under the expert care of Dr. [name] who is actively managing  - cont Cosopt BID OU  Ophthalmic Meds Ordered this visit:  Meds ordered this encounter  Medications  . Bevacizumab (AVASTIN) SOLN 1.25 mg       Return for f/u 5-6 weeks, BRVO, DFE, OCT.  There are no Patient Instructions on file for this visit.   Explained the diagnoses, plan, and follow up with the patient and they expressed understanding.  Patient expressed understanding of the importance of proper follow up care.   This document serves as a record of services personally performed by Gardiner Sleeper, MD, PhD. It was created on their behalf by Ernest Mallick, OA, an ophthalmic assistant. The creation of this record is the provider's dictation and/or activities during the visit.    Electronically signed by: Ernest Mallick, OA  04.16.2020 12:44 PM     Gardiner Sleeper, M.D., Ph.D. Diseases & Surgery of the Retina and Vitreous [facility]  I have reviewed the above documentation for accuracy and completeness, and I agree with the above. Gardiner Sleeper, M.D., Ph.D. 04/18/19 12:44 PM     Abbreviations: M myopia (nearsighted); A astigmatism; H hyperopia (farsighted); P presbyopia; Mrx spectacle prescription;  CTL contact lenses; OD right eye; OS left eye; OU both eyes  XT exotropia; ET esotropia; PEK punctate epithelial keratitis; PEE punctate epithelial erosions; DES dry eye syndrome; MGD meibomian gland dysfunction; ATs artificial tears; PFAT's  preservative free artificial tears; [place] nuclear sclerotic cataract; PSC posterior subcapsular cataract; ERM epi-retinal membrane; PVD posterior vitreous detachment; RD retinal detachment; DM diabetes mellitus; DR diabetic retinopathy; NPDR non-proliferative diabetic retinopathy; PDR proliferative diabetic retinopathy; CSME clinically significant macular edema; DME diabetic macular edema; dbh dot blot hemorrhages; CWS cotton wool spot; POAG primary open angle glaucoma; C/D cup-to-disc ratio; HVF humphrey visual field; GVF goldmann visual field; OCT optical coherence tomography; IOP intraocular pressure; BRVO Branch retinal vein occlusion; CRVO central retinal vein occlusion; CRAO central retinal artery occlusion; BRAO branch retinal artery occlusion; RT retinal tear; SB scleral buckle; PPV pars plana vitrectomy; VH Vitreous hemorrhage; PRP panretinal laser photocoagulation; IVK intravitreal kenalog; VMT vitreomacular traction; MH Macular hole;  NVD neovascularization of the disc; NVE neovascularization elsewhere; AREDS age related eye disease study; ARMD age related macular degeneration; POAG primary open angle glaucoma; EBMD epithelial/anterior basement membrane dystrophy; ACIOL anterior chamber intraocular lens; IOL intraocular lens; PCIOL posterior chamber intraocular lens; Phaco/IOL phacoemulsification with intraocular lens placement; [place] photorefractive keratectomy; LASIK laser assisted in situ keratomileusis; HTN hypertension; DM diabetes mellitus; COPD chronic obstructive pulmonary disease

## 2019-04-18 ENCOUNTER — Ambulatory Visit (INDEPENDENT_AMBULATORY_CARE_PROVIDER_SITE_OTHER): Payer: Medicare Other | Admitting: Ophthalmology

## 2019-04-18 ENCOUNTER — Other Ambulatory Visit: Payer: Self-pay

## 2019-04-18 ENCOUNTER — Encounter (INDEPENDENT_AMBULATORY_CARE_PROVIDER_SITE_OTHER): Payer: Self-pay | Admitting: Ophthalmology

## 2019-04-18 DIAGNOSIS — H40001 Preglaucoma, unspecified, right eye: Secondary | ICD-10-CM

## 2019-04-18 DIAGNOSIS — H35373 Puckering of macula, bilateral: Secondary | ICD-10-CM

## 2019-04-18 DIAGNOSIS — Z961 Presence of intraocular lens: Secondary | ICD-10-CM

## 2019-04-18 DIAGNOSIS — H3581 Retinal edema: Secondary | ICD-10-CM

## 2019-04-18 DIAGNOSIS — H40051 Ocular hypertension, right eye: Secondary | ICD-10-CM

## 2019-04-18 DIAGNOSIS — H34831 Tributary (branch) retinal vein occlusion, right eye, with macular edema: Secondary | ICD-10-CM

## 2019-04-18 DIAGNOSIS — E119 Type 2 diabetes mellitus without complications: Secondary | ICD-10-CM

## 2019-04-18 MED ORDER — BEVACIZUMAB CHEMO INJECTION 1.25MG/0.05ML SYRINGE FOR KALEIDOSCOPE
1.2500 mg | INTRAVITREAL | Status: AC | PRN
  Administered 2019-04-18: 1.25 mg via INTRAVITREAL

## 2019-04-27 ENCOUNTER — Ambulatory Visit: Payer: Medicare Other | Admitting: Neurology

## 2019-05-01 ENCOUNTER — Emergency Department (HOSPITAL_COMMUNITY): Payer: Medicare Other

## 2019-05-01 ENCOUNTER — Other Ambulatory Visit: Payer: Self-pay

## 2019-05-01 ENCOUNTER — Emergency Department (HOSPITAL_COMMUNITY)
Admission: EM | Admit: 2019-05-01 | Discharge: 2019-05-02 | Disposition: A | Discharge status: Home / Self Care | Payer: Medicare Other | Attending: Emergency Medicine | Admitting: Emergency Medicine

## 2019-05-01 DIAGNOSIS — R51 Headache: Secondary | ICD-10-CM | POA: Insufficient documentation

## 2019-05-01 DIAGNOSIS — E119 Type 2 diabetes mellitus without complications: Secondary | ICD-10-CM | POA: Diagnosis not present

## 2019-05-01 DIAGNOSIS — I10 Essential (primary) hypertension: Secondary | ICD-10-CM | POA: Diagnosis not present

## 2019-05-01 DIAGNOSIS — R519 Headache, unspecified: Secondary | ICD-10-CM

## 2019-05-01 DIAGNOSIS — Z79899 Other long term (current) drug therapy: Secondary | ICD-10-CM | POA: Diagnosis not present

## 2019-05-01 DIAGNOSIS — E039 Hypothyroidism, unspecified: Secondary | ICD-10-CM | POA: Diagnosis not present

## 2019-05-01 DIAGNOSIS — Z7984 Long term (current) use of oral hypoglycemic drugs: Secondary | ICD-10-CM | POA: Insufficient documentation

## 2019-05-01 LAB — DIFFERENTIAL
Abs Immature Granulocytes: 0.02 10*3/uL (ref 0.00–0.07)
Basophils Absolute: 0 10*3/uL (ref 0.0–0.1)
Basophils Relative: 1 %
Eosinophils Absolute: 0.4 10*3/uL (ref 0.0–0.5)
Eosinophils Relative: 8 %
Immature Granulocytes: 0 %
Lymphocytes Relative: 19 %
Lymphs Abs: 1.1 10*3/uL (ref 0.7–4.0)
Monocytes Absolute: 0.6 10*3/uL (ref 0.1–1.0)
Monocytes Relative: 10 %
Neutro Abs: 3.6 10*3/uL (ref 1.7–7.7)
Neutrophils Relative %: 62 %

## 2019-05-01 LAB — PROTIME-INR
INR: 1 (ref 0.8–1.2)
Prothrombin Time: 13.4 seconds (ref 11.4–15.2)

## 2019-05-01 LAB — COMPREHENSIVE METABOLIC PANEL
ALT: 17 U/L (ref 0–44)
AST: 21 U/L (ref 15–41)
Albumin: 4 g/dL (ref 3.5–5.0)
Alkaline Phosphatase: 69 U/L (ref 38–126)
Anion gap: 11 (ref 5–15)
BUN: 14 mg/dL (ref 8–23)
CO2: 25 mmol/L (ref 22–32)
Calcium: 9.9 mg/dL (ref 8.9–10.3)
Chloride: 104 mmol/L (ref 98–111)
Creatinine, Ser: 1.13 mg/dL — ABNORMAL HIGH (ref 0.44–1.00)
GFR calc Af Amer: 53 mL/min — ABNORMAL LOW (ref >60)
GFR calc non Af Amer: 46 mL/min — ABNORMAL LOW (ref >60)
Glucose, Bld: 115 mg/dL — ABNORMAL HIGH (ref 70–99)
Potassium: 3.8 mmol/L (ref 3.5–5.1)
Sodium: 140 mmol/L (ref 135–145)
Total Bilirubin: 0.6 mg/dL (ref 0.3–1.2)
Total Protein: 7.8 g/dL (ref 6.5–8.1)

## 2019-05-01 LAB — CBC
HCT: 42.2 % (ref 36.0–46.0)
Hemoglobin: 13.6 g/dL (ref 12.0–15.0)
MCH: 26.7 pg (ref 26.0–34.0)
MCHC: 32.2 g/dL (ref 30.0–36.0)
MCV: 82.9 fL (ref 80.0–100.0)
Platelets: 271 10*3/uL (ref 150–400)
RBC: 5.09 MIL/uL (ref 3.87–5.11)
RDW: 16.5 % — ABNORMAL HIGH (ref 11.5–15.5)
WBC: 5.7 10*3/uL (ref 4.0–10.5)
nRBC: 0 % (ref 0.0–0.2)

## 2019-05-01 LAB — APTT: aPTT: 27 seconds (ref 24–36)

## 2019-05-01 LAB — I-STAT CREATININE, ED: Creatinine, Ser: 1.1 mg/dL — ABNORMAL HIGH (ref 0.44–1.00)

## 2019-05-01 LAB — CBG MONITORING, ED: Glucose-Capillary: 114 mg/dL — ABNORMAL HIGH (ref 70–99)

## 2019-05-01 MED ORDER — TETRACAINE HCL 0.5 % OP SOLN
2.0000 [drp] | Freq: Once | OPHTHALMIC | Status: AC
  Administered 2019-05-01: 2 [drp] via OPHTHALMIC
  Filled 2019-05-01: qty 4

## 2019-05-01 MED ORDER — SODIUM CHLORIDE 0.9% FLUSH
3.0000 mL | Freq: Once | INTRAVENOUS | Status: DC
Start: 2019-05-01 — End: 2019-05-02

## 2019-05-01 MED ORDER — IBUPROFEN 400 MG PO TABS
600.0000 mg | ORAL_TABLET | Freq: Once | ORAL | Status: AC
  Administered 2019-05-01: 600 mg via ORAL
  Filled 2019-05-01: qty 1

## 2019-05-01 MED ORDER — ACETAMINOPHEN 325 MG PO TABS: 650.0000 mg | ORAL_TABLET | Freq: Once | ORAL | Status: DC

## 2019-05-01 MED ORDER — FLUORESCEIN SODIUM 1 MG OP STRP
1.0000 | ORAL_STRIP | Freq: Once | OPHTHALMIC | Status: AC
  Administered 2019-05-01: 1 via OPHTHALMIC
  Filled 2019-05-01: qty 1

## 2019-05-01 NOTE — ED Notes (Signed)
Patient transported to MRI 

## 2019-05-01 NOTE — ED Notes (Signed)
Patient transported to CT 

## 2019-05-01 NOTE — ED Triage Notes (Signed)
Pt reports sudden headache x 1 hour. Reports feeling dizzy. Denies any vision changes, A&O x4 but states she is having difficulty thinking.  Niece brought pt in.

## 2019-05-01 NOTE — ED Provider Notes (Signed)
[facility] EMERGENCY DEPARTMENT Provider Note   CSN: [id number] Arrival date & time: 05/01/19  2146    History   Chief Complaint No chief complaint on file.   HPI Alejandra Davis is a 83 y.o. female.     Patient with history of diabetes and hypertension, BRVO on the right presenting with gradual onset frontal headache that onset while she was resting at home tonight watching television.  She reports gradual onset headache in the front of her head that was associated with some nausea and some dizziness which is since resolved.  She did not take any for the headache at home.  States at home she had some spinning with position change and "did not feel quite right in her head" which is what concerned her made her want to come in.  She has had similar headaches in the past for which she takes ibuprofen.  She denies any focal weakness, numbness or tingling.  No difficulty speaking or difficulty swallowing.  No chest pain or shortness of breath.  No cough or fever.  Denies any nausea or photophobia.  No change in her vision, blurry vision or double vision.  No eye pain.  She reports the headache has resolved and she is feeling better at this time.  She denies any dizziness or vertigo currently.  The history is provided by the patient.    Past Medical History:  Diagnosis Date  . Chicken pox   . Cystocele   . Diabetes mellitus without complication ([place])   . Hypertension   . Thyroid disease    hypothyroidism    Patient Active Problem List   Diagnosis Date Noted  . Acquired hypothyroidism 03/13/2019  . Murmur 09/01/2018  . Telogen effluvium 06/07/2018  . Eczema of scalp 03/31/2018  . Xerosis cutis 02/25/2018  . Immunization reaction 02/08/2018  . Keratosis pilaris 02/08/2018  . Health care maintenance 02/07/2018  . Controlled type 2 diabetes mellitus without complication, without long-term current use of insulin ([place]) 02/07/2018  . Cystocele   . Osteopenia   .  Thyroid disease   . Hypertension     Past Surgical History:  Procedure Laterality Date  . ABDOMINAL HYSTERECTOMY  1990   TAH BSO  . OOPHORECTOMY     BSO  . Vaginal Bx     Papilloma     OB History    Gravida  3   Para  2   Term  2   Preterm      AB  1   Living  2     SAB  1   TAB      Ectopic      Multiple      Live Births               Home Medications    Prior to Admission medications   Medication Sig Start Date End Date Taking? Authorizing Provider  amLODipine (NORVASC) 10 MG tablet Take 1 tablet (10 mg total) by mouth daily. 03/13/19   Libby Maw, MD  calcium-vitamin D (OSCAL 500/200 D-3) 500-200 MG-UNIT tablet Take 1 tablet by mouth 2 (two) times daily. 09/01/18   Libby Maw, MD  dorzolamide-timolol (COSOPT) 22.3-6.8 MG/ML ophthalmic solution Place 1 drop into the right eye 2 (two) times daily. 07/06/18 07/06/19  [name], MD  ketorolac (ACULAR) 0.5 % ophthalmic solution Place 1 drop into the right eye 4 (four) times daily. 03/20/19   [name], MD  levothyroxine ([place], [place])  75 MCG tablet TAKE 1 TABLET(75 MCG) BY MOUTH DAILY 03/13/19   Libby Maw, MD  metFORMIN (GLUCOPHAGE-XR) 500 MG 24 hr tablet Take 1 tablet (500 mg total) by mouth at bedtime. 03/13/19   Libby Maw, MD  metoprolol succinate (TOPROL-XL) 50 MG 24 hr tablet Take 1 tablet (50 mg total) by mouth daily. 03/13/19   Libby Maw, MD  prednisoLONE acetate (PRED FORTE) 1 % ophthalmic suspension Place 1 drop into the right eye 4 (four) times daily. 03/20/19   [name], MD  triamcinolone ointment (KENALOG) 0.1 % Apply 1 application topically 2 (two) times daily. For rash on thighs and arms. 06/29/18   Libby Maw, MD    Family History Family History  Problem Relation Age of Onset  . Sickle cell anemia Other   . Hypertension Mother   . Cancer Father        LIVER  . Heart disease Brother   . Cancer Brother         STOMACH    Social History Social History   Tobacco Use  . Smoking status: Never Smoker  . Smokeless tobacco: Never Used  Substance Use Topics  . Alcohol use: No  . Drug use: No     Allergies   Patient has no known allergies.   Review of Systems Review of Systems  Constitutional: Negative for activity change, appetite change and fever.  HENT: Negative for congestion and rhinorrhea.   Eyes: Negative for photophobia, pain and visual disturbance.  Respiratory: Negative for cough, chest tightness and shortness of breath.   Cardiovascular: Negative for chest pain and leg swelling.  Gastrointestinal: Positive for nausea. Negative for vomiting.  Genitourinary: Negative for dysuria.  Musculoskeletal: Negative for arthralgias and myalgias.  Skin: Negative for rash.  Neurological: Positive for dizziness, light-headedness and headaches. Negative for speech difficulty, weakness and numbness.    all other systems are negative except as noted in the HPI and PMH.    Physical Exam Updated Vital Signs BP (!) 175/80   Pulse 70   Temp 98.6 F (37 C) (Oral)   Resp 18   Ht 5\' 3"  (1.6 m)   Wt 61.2 kg   SpO2 99%   BMI 23.91 kg/m   Physical Exam Vitals signs and nursing note reviewed.  Constitutional:      General: She is not in acute distress.    Appearance: She is well-developed.  HENT:     Head: Normocephalic and atraumatic.     Comments: No temporal artery tenderness    Mouth/Throat:     Pharynx: No oropharyngeal exudate.  Eyes:     Conjunctiva/sclera: Conjunctivae normal.     Pupils: Pupils are equal, round, and reactive to light.     Comments: No pain to palpation of her orbits bilaterally, no nystagmus  No temporal artery tenderness. Intraocular pressure OD 17, OS 18  Neck:     Musculoskeletal: Normal range of motion and neck supple.     Comments: No meningismus. Cardiovascular:     Rate and Rhythm: Normal rate and regular rhythm.     Heart sounds: Normal heart  sounds. No murmur.  Pulmonary:     Effort: Pulmonary effort is normal. No respiratory distress.     Breath sounds: Normal breath sounds.  Abdominal:     Palpations: Abdomen is soft.     Tenderness: There is no abdominal tenderness. There is no guarding or rebound.  Musculoskeletal: Normal range of motion.  General: No tenderness.  Skin:    General: Skin is warm.     Capillary Refill: Capillary refill takes less than 2 seconds.  Neurological:     General: No focal deficit present.     Mental Status: She is alert and oriented to person, place, and time. Mental status is at baseline.     Cranial Nerves: No cranial nerve deficit.     Motor: No abnormal muscle tone.     Coordination: Coordination normal.     Comments: CN 2-12 intact, no ataxia on finger to nose, no nystagmus, 5/5 strength throughout, no pronator drift, Romberg negative, normal gait.  No ataxia in finger-to-nose, no nystagmus, negative Romberg, normal gait  Psychiatric:        Behavior: Behavior normal.      ED Treatments / Results  Labs (all labs ordered are listed, but only abnormal results are displayed) Labs Reviewed  COMPREHENSIVE METABOLIC PANEL - Abnormal; Notable for the following components:      Result Value   Glucose, Bld 115 (*)    Creatinine, Ser 1.13 (*)    GFR calc non Af Amer 46 (*)    GFR calc Af Amer 53 (*)    All other components within normal limits  CBC - Abnormal; Notable for the following components:   RDW 16.5 (*)    All other components within normal limits  CBG MONITORING, ED - Abnormal; Notable for the following components:   Glucose-Capillary 114 (*)    All other components within normal limits  I-STAT CREATININE, ED - Abnormal; Notable for the following components:   Creatinine, Ser 1.10 (*)    All other components within normal limits  PROTIME-INR  APTT  DIFFERENTIAL  SEDIMENTATION RATE  CBG MONITORING, ED    EKG EKG Interpretation  Date/Time:  Monday May 01 2019  23:39:50 EDT Ventricular Rate:  64 PR Interval:    QRS Duration: 82 QT Interval:  395 QTC Calculation: 408 R Axis:   51 Text Interpretation:  Sinus rhythm Consider left atrial enlargement No significant change was found Confirmed by Ezequiel Essex [phone]) on 05/01/2019 11:44:09 PM   Radiology Ct Head Wo Contrast  Result Date: 05/01/2019 CLINICAL DATA:  Sudden headache, altered level of consciousness, dizziness EXAM: CT HEAD WITHOUT CONTRAST TECHNIQUE: Contiguous axial images were obtained from the base of the skull through the vertex without intravenous contrast. COMPARISON:  10/31/2003 FINDINGS: Brain: There is atrophy and chronic small vessel disease changes. No acute intracranial abnormality. Specifically, no hemorrhage, hydrocephalus, mass lesion, acute infarction, or significant intracranial injury. Vascular: No hyperdense vessel or unexpected calcification. Skull: No acute calvarial abnormality. Sinuses/Orbits: Visualized paranasal sinuses and mastoids clear. Orbital soft tissues unremarkable. Other: None IMPRESSION: Atrophy, chronic microvascular disease. No acute intracranial abnormality. Electronically Signed   By: Rolm Baptise M.D.   On: 05/01/2019 22:57   Mr Brain Wo Contrast  Result Date: 05/02/2019 CLINICAL DATA:  83 y/o  F; 1 hour of sudden headache.  Dizziness. EXAM: MRI HEAD WITHOUT CONTRAST TECHNIQUE: Multiplanar, multiecho pulse sequences of the brain and surrounding structures were obtained without intravenous contrast. COMPARISON:  05/01/2019 CT head. FINDINGS: Brain: No acute infarction, hemorrhage, hydrocephalus, extra-axial collection or mass lesion. Small chronic infarct in right hemi pons. Early confluent nonspecific T2 FLAIR hyperintensities in subcortical and periventricular white matter are compatible with moderate chronic microvascular ischemic changes. Moderate volume loss of the brain. Vascular: Normal flow voids. Skull and upper cervical spine: Normal marrow signal.  Sinuses/Orbits: Negative. Other: Bilateral intra-ocular  lens replacement. IMPRESSION: 1. No acute intracranial abnormality identified. 2. Moderate chronic microvascular ischemic changes and volume loss of the brain. Small chronic infarct in right hemi pons. Electronically Signed   By: Kristine Garbe M.D.   On: 05/02/2019 00:43    Procedures Procedures (including critical care time)  Medications Ordered in ED Medications  sodium chloride flush (NS) 0.9 % injection 3 mL (has no administration in time range)  tetracaine (PONTOCAINE) 0.5 % ophthalmic solution 2 drop (has no administration in time range)  fluorescein ophthalmic strip 1 strip (has no administration in time range)  ibuprofen (ADVIL) tablet 600 mg (has no administration in time range)     Initial Impression / Assessment and Plan / ED Course  I have reviewed the triage vital signs and the nursing notes.  Pertinent labs & imaging results that were available during my care of the patient were reviewed by me and considered in my medical decision making (see chart for details).       Gradual onset headache associated with dizziness and nausea which has since resolved.  Neurological exam is nonfocal.  CT head is negative within 6 hours of headache onset.  Doubt subarachnoid hemorrhage, meningitis, temporal arteritis.  Visual acuity is normal.  Intraocular pressure is normal.  Low suspicion for glaucoma.  Patient reports improvement of her headache.  Given her persistent dizziness and perceived difficulty walking, MRI was obtained which shows no acute infarct.  Blood pressure is slightly elevated with history of the same. Patient reports resolution of her headache and is tolerating p.o. and ambulatory.  She appears stable for discharge.  Discussed with her niece Santiago Glad by phone. Return precautions discussed.   Final Clinical Impressions(s) / ED Diagnoses   Final diagnoses:  Headache, unspecified headache type     ED Discharge Orders    None       Kendarius Vigen, Annie Main, MD 05/02/19 [phone]

## 2019-05-01 NOTE — ED Notes (Signed)
ED Provider at bedside. 

## 2019-05-01 NOTE — ED Notes (Signed)
Jossie Ng pts niece, in parking lot wants to know an update soon as possible @ [phone]

## 2019-05-02 ENCOUNTER — Encounter: Payer: Self-pay | Admitting: Family Medicine

## 2019-05-02 ENCOUNTER — Ambulatory Visit (INDEPENDENT_AMBULATORY_CARE_PROVIDER_SITE_OTHER): Payer: Medicare Other | Admitting: Family Medicine

## 2019-05-02 ENCOUNTER — Telehealth: Payer: Self-pay | Admitting: Neurology

## 2019-05-02 VITALS — Ht 63.0 in

## 2019-05-02 DIAGNOSIS — I10 Essential (primary) hypertension: Secondary | ICD-10-CM

## 2019-05-02 LAB — SEDIMENTATION RATE: Sed Rate: 16 mm/hr (ref 0–22)

## 2019-05-02 NOTE — ED Notes (Signed)
Patient ambulated in the hallway with no assitance and no obvious distress noted-Monique,RN

## 2019-05-02 NOTE — Progress Notes (Signed)
Virtual Visit via Telephone Note  I connected with Alejandra Davis on 05/02/19 at  3:30 PM EDT by telephone and verified that I am speaking with the correct person using two identifiers.  Location: Patient: home Provider:   I discussed the limitations, risks, security and privacy concerns of performing an evaluation and management service by telephone and the availability of in person appointments. I also discussed with the patient that there may be a patient responsible charge related to this service. The patient expressed understanding and agreed to proceed.  Interactive video and audio telecommunications were attempted between myself and the patient. However they failed due to the patient having technical difficulties or not having access to video capability. We continued and completed with audio only.   Established Patient Office Visit  Subjective:  Patient ID: Alejandra Davis, female    DOB: 28-Apr-1936  Age: 83 y.o. MRN: [id number]  CC:  Chief Complaint  Patient presents with  . Follow-up    HPI Alejandra Davis presents for follow-up status post ER visit last evening for acute frontal headache.  The headache resolved while the patient was seen in the emergency room.  Patient's vital signs were normal except for blood pressure was 175/80.  Intraocular pressures were checked and were normal.  CBC and sed rate were normal.  CMP showed a GFR of 53.  MRI did show an old infarction in the pons area and chronic vascular disease.  Patient is without headache today.  She is doing well today through her daughter [name]'s report.  Past Medical History:  Diagnosis Date  . Chicken pox   . Cystocele   . Diabetes mellitus without complication ([place])   . Hypertension   . Thyroid disease    hypothyroidism    Past Surgical History:  Procedure Laterality Date  . ABDOMINAL HYSTERECTOMY  1990   TAH BSO  . OOPHORECTOMY     BSO  . Vaginal Bx     Papilloma    Family History  Problem Relation Age  of Onset  . Sickle cell anemia Other   . Hypertension Mother   . Cancer Father        LIVER  . Heart disease Brother   . Cancer Brother        STOMACH    Social History   Socioeconomic History  . Marital status: Divorced    Spouse name: Not on file  . Number of children: Not on file  . Years of education: Not on file  . Highest education level: Not on file  Occupational History  . Not on file  Social Needs  . Financial resource strain: Not on file  . Food insecurity:    Worry: Not on file    Inability: Not on file  . Transportation needs:    Medical: Not on file    Non-medical: Not on file  Tobacco Use  . Smoking status: Never Smoker  . Smokeless tobacco: Never Used  Substance and Sexual Activity  . Alcohol use: No  . Drug use: No  . Sexual activity: Not on file  Lifestyle  . Physical activity:    Days per week: Not on file    Minutes per session: Not on file  . Stress: Not on file  Relationships  . Social connections:    Talks on phone: Not on file    Gets together: Not on file    Attends religious service: Not on file    Active member of club  or organization: Not on file    Attends meetings of clubs or organizations: Not on file    Relationship status: Not on file  . Intimate partner violence:    Fear of current or ex partner: Not on file    Emotionally abused: Not on file    Physically abused: Not on file    Forced sexual activity: Not on file  Other Topics Concern  . Not on file  Social History Narrative  . Not on file    Outpatient Medications Prior to Visit  Medication Sig Dispense Refill  . amLODipine (NORVASC) 10 MG tablet Take 1 tablet (10 mg total) by mouth daily. 90 tablet 1  . calcium-vitamin D (OSCAL 500/200 D-3) 500-200 MG-UNIT tablet Take 1 tablet by mouth 2 (two) times daily. 180 tablet 1  . dorzolamide-timolol (COSOPT) 22.3-6.8 MG/ML ophthalmic solution Place 1 drop into the right eye 2 (two) times daily. 10 mL 1  . ketorolac (ACULAR)  0.5 % ophthalmic solution Place 1 drop into the right eye 4 (four) times daily. 10 mL 2  . levothyroxine (SYNTHROID, LEVOTHROID) 75 MCG tablet TAKE 1 TABLET(75 MCG) BY MOUTH DAILY (Patient taking differently: Take 75 mcg by mouth daily before breakfast. ) 90 tablet 1  . metFORMIN (GLUCOPHAGE-XR) 500 MG 24 hr tablet Take 1 tablet (500 mg total) by mouth at bedtime. 90 tablet 1  . metoprolol succinate (TOPROL-XL) 50 MG 24 hr tablet Take 1 tablet (50 mg total) by mouth daily. 90 tablet 1  . prednisoLONE acetate (PRED FORTE) 1 % ophthalmic suspension Place 1 drop into the right eye 4 (four) times daily. 10 mL 2  . triamcinolone ointment (KENALOG) 0.1 % Apply 1 application topically 2 (two) times daily. For rash on thighs and arms. (Patient not taking: Reported on 05/02/2019) 30 g 0   Facility-Administered Medications Prior to Visit  Medication Dose Route Frequency Provider Last Rate Last Dose  . Bevacizumab (AVASTIN) SOLN 1.25 mg  1.25 mg Intravitreal  [name], MD   1.25 mg at 10/16/18 2335  . Bevacizumab (AVASTIN) SOLN 1.25 mg  1.25 mg Intravitreal  [name], MD   1.25 mg at 11/15/18 0939  . Bevacizumab (AVASTIN) SOLN 1.25 mg  1.25 mg Intravitreal  [name], MD   1.25 mg at 12/13/18 1542  . Bevacizumab (AVASTIN) SOLN 1.25 mg  1.25 mg Intravitreal  [name], MD   1.25 mg at 02/15/19 1725    No Known Allergies  ROS Review of Systems  Constitutional: Negative.   Neurological: Negative.   Hematological: Negative.   Psychiatric/Behavioral: Negative.       Objective:    Physical Exam  Constitutional: She is oriented to person, place, and time. No distress.  Neurological: She is alert and oriented to person, place, and time.  Psychiatric: She has a normal mood and affect. Her behavior is normal.    Ht 5\' 3"  (1.6 m)   BMI 23.91 kg/m  Wt Readings from Last 3 Encounters:  05/01/19 135 lb (61.2 kg)  03/31/19 131 lb (59.4 kg)  03/13/19 129 lb 8 oz (58.7 kg)     Health  Maintenance Due  Topic Date Due  . FOOT EXAM  10/22/1948  . OPHTHALMOLOGY EXAM  10/22/1948  . TETANUS/TDAP  10/22/1957  . PNA vac Low Risk Adult (1 of 2 - PCV13) 10/23/2003    There are no preventive care reminders to display for this patient.  Lab Results  Component Value Date   TSH 1.32 03/13/2019  Lab Results  Component Value Date   WBC 5.7 05/01/2019   HGB 13.6 05/01/2019   HCT 42.2 05/01/2019   MCV 82.9 05/01/2019   PLT 271 05/01/2019   Lab Results  Component Value Date   NA 140 05/01/2019   K 3.8 05/01/2019   CO2 25 05/01/2019   GLUCOSE 115 (H) 05/01/2019   BUN 14 05/01/2019   CREATININE 1.10 (H) 05/01/2019   BILITOT 0.6 05/01/2019   ALKPHOS 69 05/01/2019   AST 21 05/01/2019   ALT 17 05/01/2019   PROT 7.8 05/01/2019   ALBUMIN 4.0 05/01/2019   CALCIUM 9.9 05/01/2019   ANIONGAP 11 05/01/2019   GFR 53.27 (L) 03/13/2019   Lab Results  Component Value Date   CHOL 180 09/06/2018   Lab Results  Component Value Date   HDL 74.60 09/06/2018   Lab Results  Component Value Date   LDLCALC 90 09/06/2018   Lab Results  Component Value Date   TRIG 76.0 09/06/2018   Lab Results  Component Value Date   CHOLHDL 2 09/06/2018   Lab Results  Component Value Date   HGBA1C 6.9 (H) 03/13/2019      Assessment & Plan:   Problem List Items Addressed This Visit      Cardiovascular and Mediastinum   Hypertension - Primary      No orders of the defined types were placed in this encounter.   Follow-up: Return in about 1 week (around 05/09/2019).    Libby Maw, MD   History of Present Illness:    Observations/Objective:   Assessment and Plan:   Follow Up Instructions:    I discussed the assessment and treatment plan with the patient. The patient was provided an opportunity to ask questions and all were answered. The patient agreed with the plan and demonstrated an understanding of the instructions.   The patient was advised to call  back or seek an in-person evaluation if the symptoms worsen or if the condition fails to improve as anticipated.  I provided 15 minutes of non-face-to-face time during this encounter.   Spoke with patient's daughter Ashby Dawes today mostly well and interpretation of what occurred in the emergency room last evening.  We discussed this.  Patient is doing better today.  Only thing of note her blood pressure was elevated in the emergency room last night.  Explained and this would not be an unusual finding.  Family will check her pressure over the next week and I will have on the follow-up visit with the patient next week.

## 2019-05-02 NOTE — ED Notes (Signed)
ED Provider at bedside. 

## 2019-05-02 NOTE — Telephone Encounter (Signed)
Called patient back and had to LVM again. I left my name and direct number for her to call back.

## 2019-05-02 NOTE — Telephone Encounter (Signed)
Patient would like a call back about her apt.

## 2019-05-02 NOTE — Discharge Instructions (Addendum)
Your testing today shows no evidence of stroke.  Take your blood pressure medications as prescribed and follow-up with your primary doctor.  Return to the ED with difficulty speaking, difficulty swallowing, focal weakness, numbness, tingling or other concerns.

## 2019-05-03 NOTE — Telephone Encounter (Signed)
Due to current COVID 19 pandemic, our office is severely reducing in office visits until further notice, in order to minimize the risk to our patients and healthcare providers.   Patient returned my call today and I offered her an in office visit per Dr. Rexene Alberts for her 5/12 appointment. Patient accepted and I explained to her that we are taking precaution against COVID-19 and when she arrives for her appointment she will need to park under the [place] at the entrance and a staff member will assist her. I advised that her temperature will be taken and she will be asked screening questions, as well as the one person allowed to accompany her. She verbalized understanding.

## 2019-05-09 ENCOUNTER — Encounter: Payer: Self-pay | Admitting: Neurology

## 2019-05-09 ENCOUNTER — Other Ambulatory Visit: Payer: Self-pay

## 2019-05-09 ENCOUNTER — Ambulatory Visit (INDEPENDENT_AMBULATORY_CARE_PROVIDER_SITE_OTHER): Payer: Medicare Other | Admitting: Neurology

## 2019-05-09 VITALS — BP 159/69 | HR 70 | Ht 62.0 in | Wt 125.0 lb

## 2019-05-09 DIAGNOSIS — R51 Headache: Secondary | ICD-10-CM

## 2019-05-09 DIAGNOSIS — R519 Headache, unspecified: Secondary | ICD-10-CM

## 2019-05-09 DIAGNOSIS — R0683 Snoring: Secondary | ICD-10-CM

## 2019-05-09 DIAGNOSIS — R413 Other amnesia: Secondary | ICD-10-CM

## 2019-05-09 DIAGNOSIS — I6381 Other cerebral infarction due to occlusion or stenosis of small artery: Secondary | ICD-10-CM

## 2019-05-09 MED ORDER — DONEPEZIL HCL 10 MG PO TABS: 5.0000 mg | ORAL_TABLET | Freq: Every day | ORAL | 3 refills | Status: DC

## 2019-05-09 NOTE — Patient Instructions (Signed)
You have complaints of memory loss: memory loss or changes in cognitive function can have many reasons and does not always mean you have dementia. Conditions that can contribute to subjective or objective memory loss include: depression, stress, poor sleep from insomnia or sleep apnea, dehydration, fluctuation in blood sugar values, thyroid or electrolyte dysfunction and certain vitamin deficiencies. Dementia can be caused by stroke, brain atherosclerosis or brain vascular disease due to vascular risk factors (smoking, high blood pressure, high cholesterol, obesity and uncontrolled diabetes), certain degenerative brain disorders (including Parkinson's disease and Multiple sclerosis) and by Alzheimer's disease or other, more rare and sometimes hereditary causes. We will do some additional testing:   1. Sleep study.  2. You have had recent blood work.  3. You have had a recent MRI brain.   We will start medication: Aricept (generic name: donepezil) 10 mg: take 1/2 pill each evening. Common side effects may include dry eyes, dry mouth, confusion, low pulse, low blood pressure, also GI related side effects (nausea, vomiting, diarrhea, constipation), headaches; rare side effects may include hallucinations and seizures.   I am worried about your driving, please have your family monitor it and I would suggest at this point only local roads, familiar routes, no nighttime and no highway driving.   Please start wearing your hearing aid or aids.  If you have sleep apnea, I will recommend treatment for this, likely in the form of CPAP.

## 2019-05-09 NOTE — Progress Notes (Signed)
Subjective:    Patient ID: Alejandra Davis is a 83 y.o. female.  HPI     Star Age, MD, PhD [facility] [address] [place], [place] [address]  Dear Dr. Ethelene Hal,  I saw your patient, Alejandra Davis, upon your kind request in the neurologic clinic today for initial consultation of her memory loss.  Patient is accompanied by her daughter today.  As you know, Alejandra Davis is an 83 year old right-handed woman with an underlying medical history of diabetes, hypertension, and hypothyroidism, who reports forgetfulness for the past few years.  Her daughter reports that she has had memory loss for the past 8 years, it started gradually, was slowly progressive but more noticeable in the past 2 years.  She has not had any major confusion or hallucinations or personality or mood changes.  She lives alone, she is divorced, has 1 son who lives close by and one niece who lives close by, daughter lives in [place].  She is a non-smoker and drinks caffeine and limitation, typically 1 cup of coffee in the morning, occasional tea, occasional soda, typically no milk, some orange juice with breakfast.  She does snore, she may have had some breathing pauses while asleep as witnessed by her daughter when they were on vacation once and shared room.  Patient denies any significant daytime somnolence.  She does not have night to night nocturia, does have recurrent headaches but is not sure if she wakes up with headaches in the middle of the night or first thing in the morning.  The first symptoms with regard to her memory have been repeating herself for asking the same questions over again.  This was first noticed by her niece during a family gathering some 8 years ago.  She has some family history of memory loss, mom had memory issues in her late years, died at late 9s, father lived also to be in the late 38s, may have had some minor memory issues but patient's daughter does not recall  grandfather to have major memory issues, she feels he was quite sharp.  She does remember that patient's mom had some memory issues.  Patient also reports maternal grandmother had memory loss.  Paternal grandmother and paternal aunt may have had some memory issues as well.  No actual diagnosis of Alzheimer's disease in the family.  She lives alone, she still drives, daughter is somewhat worried about her driving but patient limits her driving typically to local routes and familiar places.  She has not fallen recently.  She had a prior head CT with and without contrast on 10/31/2003 and the daughter brought a report.  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  I reviewed your office note from September 2019.  She has had recent blood work which I reviewed, she had CBC, CMP, INR, ESR on 05/01/2019 with largely unremarkable results.  She had a TSH and urinalysis on 03/13/2019, with unremarkable results, A1c in March 2020 is slightly higher than before but still below 7 at 6.9.  She had a recent brain MRI without contrast on 05/02/2019 and I reviewed the results: IMPRESSION: 1. No acute intracranial abnormality identified. 2. Moderate chronic microvascular ischemic changes and volume loss of the brain. Small chronic infarct in right hemi pons.  She had a head CT without contrast on 05/01/2019 and I reviewed the results: IMPRESSION: Atrophy, chronic microvascular disease.   No acute intracranial abnormality. Of note, she presented to the emergency room  on 05/01/2019 with new onset headache, has had a prior headache similar to the 1.  She was treated symptomatically, she had a head CT and brain MRI.  She had an elevated blood pressure.  Her Past Medical History Is Significant For: Past Medical History:  Diagnosis Date  . Chicken pox   . Cystocele   . Diabetes mellitus without complication ([place])   . Hypertension   . Thyroid disease    hypothyroidism    Her Past Surgical History Is  Significant For: Past Surgical History:  Procedure Laterality Date  . ABDOMINAL HYSTERECTOMY  1990   TAH BSO  . OOPHORECTOMY     BSO  . Vaginal Bx     Papilloma    Her Family History Is Significant For: Family History  Problem Relation Age of Onset  . Sickle cell anemia Other   . Hypertension Mother   . Cancer Father        LIVER  . Heart disease Brother   . Cancer Brother        STOMACH    Her Social History Is Significant For: Social History   Socioeconomic History  . Marital status: Divorced    Spouse name: Not on file  . Number of children: Not on file  . Years of education: Not on file  . Highest education level: Not on file  Occupational History  . Not on file  Social Needs  . Financial resource strain: Not on file  . Food insecurity:    Worry: Not on file    Inability: Not on file  . Transportation needs:    Medical: Not on file    Non-medical: Not on file  Tobacco Use  . Smoking status: Never Smoker  . Smokeless tobacco: Never Used  Substance and Sexual Activity  . Alcohol use: No  . Drug use: No  . Sexual activity: Not on file  Lifestyle  . Physical activity:    Days per week: Not on file    Minutes per session: Not on file  . Stress: Not on file  Relationships  . Social connections:    Talks on phone: Not on file    Gets together: Not on file    Attends religious service: Not on file    Active member of club or organization: Not on file    Attends meetings of clubs or organizations: Not on file    Relationship status: Not on file  Other Topics Concern  . Not on file  Social History Narrative  . Not on file    Her Allergies Are:  No Known Allergies:   Her Current Medications Are:  Outpatient Encounter Medications as of 05/09/2019  Medication Sig  . amLODipine (NORVASC) 10 MG tablet Take 1 tablet (10 mg total) by mouth daily.  . calcium-vitamin D (OSCAL 500/200 D-3) 500-200 MG-UNIT tablet Take 1 tablet by mouth 2 (two) times daily.  .  dorzolamide-timolol (COSOPT) 22.3-6.8 MG/ML ophthalmic solution Place 1 drop into the right eye 2 (two) times daily.  Marland Kitchen ketorolac (ACULAR) 0.5 % ophthalmic solution Place 1 drop into the right eye 4 (four) times daily.  Marland Kitchen levothyroxine (SYNTHROID, LEVOTHROID) 75 MCG tablet TAKE 1 TABLET(75 MCG) BY MOUTH DAILY (Patient taking differently: Take 75 mcg by mouth daily before breakfast. )  . metFORMIN (GLUCOPHAGE-XR) 500 MG 24 hr tablet Take 1 tablet (500 mg total) by mouth at bedtime.  . metoprolol succinate (TOPROL-XL) 50 MG 24 hr tablet Take 1 tablet (50 mg total)  by mouth daily.  . prednisoLONE acetate (PRED FORTE) 1 % ophthalmic suspension Place 1 drop into the right eye 4 (four) times daily.  Marland Kitchen triamcinolone ointment (KENALOG) 0.1 % Apply 1 application topically 2 (two) times daily. For rash on thighs and arms.   Facility-Administered Encounter Medications as of 05/09/2019  Medication  . Bevacizumab (AVASTIN) SOLN 1.25 mg  . Bevacizumab (AVASTIN) SOLN 1.25 mg  . Bevacizumab (AVASTIN) SOLN 1.25 mg  . Bevacizumab (AVASTIN) SOLN 1.25 mg  :   Review of Systems:  Out of a complete 14 point review of systems, all are reviewed and negative with the exception of these symptoms as listed below:   Review of Systems  Neurological:       Pt presents today to discuss her memory and headaches. Pt had imaging recently.     Objective:  Neurological Exam  Physical Exam Physical Examination:   Vitals:   05/09/19 0935  BP: (!) 159/69  Pulse: 70    General Examination: The patient is a very pleasant 83 y.o. female in no acute distress. She appears well-developed and well-nourished and well groomed.   HEENT: Normocephalic, atraumatic, pupils are equal, round and reactive to light and accommodation. She is status post bilateral cataract surgeries. Extraocular tracking is good without limitation to gaze excursion or nystagmus noted. Normal smooth pursuit is noted. Hearing is impaired, no hearing  aids. Face is symmetric with normal facial animation and normal facial sensation. Speech is clear with no dysarthria noted. There is no hypophonia. There is no lip, neck/head, jaw or voice tremor. Neck is supple with full range of passive and active motion. There are no carotid bruits on auscultation. Oropharynx exam reveals: mild mouth dryness, adequate dental hygiene and moderate airway crowding, due to Wider uvula, longer uvula, small airway entry, elongated tongue.  Tongue protrudes centrally in palate elevates symmetrically. Mallampati is class I. Tongue protrudes centrally and palate elevates symmetrically. Tonsils are not seen, ?absent, she has not had a TE. Neck size is 13 5/8 inches.   Chest: Clear to auscultation without wheezing, rhonchi or crackles noted.  Heart: S1+S2+0, regular and normal without murmurs, rubs or gallops noted.   Abdomen: Soft, non-tender and non-distended with normal bowel sounds appreciated on auscultation.  Extremities: There is no pitting edema in the distal lower extremities bilaterally.   Skin: Warm and dry without trophic changes noted.  Musculoskeletal: exam reveals no obvious joint deformities, tenderness or joint swelling or erythema.   Neurologically:  Mental status: The patient is awake, alert and paying good attention, on orientation, she is not correct with the exact date and month and "doctor".  Her memory, particularly her short-term memory is impaired.  On 05/09/2019: MMSE: 20/30, CDT: 4/4, AFT: 6/min.  There is no evidence of aphasia, agnosia, apraxia or anomia. Speech is clear with normal prosody and enunciation. Thought process is linear. Mood is normal and affect is normal.  Cranial nerves II - XII are as described above under HEENT exam. In addition: shoulder shrug is normal with equal shoulder height noted. Motor exam: Normal bulk, strength and tone is noted. There is no drift, tremor or rebound. Romberg is neg. Reflexes are 1+ throughout. Fine  motor skills and coordination: intact with normal finger taps, normal hand movements, normal rapid alternating patting, normal foot taps and normal foot agility.  Cerebellar testing: No dysmetria or intention tremor. There is no truncal or gait ataxia.  Sensory exam: intact to light touch.  Gait, station and balance: She  stands easily. No veering to one side is noted. No leaning to one side is noted. Posture is age-appropriate and stance is narrow based. Gait shows normal stride length and normal pace. No problems turning are noted.               Assessment and Plan:   In summary, Alejandra Davis is a very pleasant 83 y.o.-year old female with an underlying medical history of diabetes, hypertension, and hypothyroidism, Who presents for evaluation of her memory loss.  She has a several year history of forgetfulness primarily.  She has a moderately abnormal memory score today, her history and brain imaging results would support concern for vascular dementia, nevertheless, she also gives a family history of memory loss which would possibly put her higher risk for Alzheimer's disease, she may have a combination of issues.  We talked about the diagnosis of dementia, memory loss in general, the different types of dementia quite a bit today.  We had an extended visit.  We also talked about contributors to vascular disease, she has had a lacunar stroke in the past but no telltale symptoms in the past.  She would benefit from sleep evaluation for concern for underlying obstructive sleep apnea which can be a contributor to vascular disease and put her at higher risk for vascular dementia as well.  Therefore, we will proceed with sleep study testing.  She has had a recent brain scan and also lab work and other diagnostic tests are not warranted quite yet.  We talked about different treatment options and supportive measures, healthy lifestyle, good nutrition, good hydration, good sleep, exercise.  We talked about the  importance of making plans for long-term for her living situation as she is currently living alone.  We talked about the importance of monitoring driving.  I am somewhat worried about her driving.  Her daughter is encouraged to sit with her and monitor her driving to her local grocery store etc. I stressed the importance of regular exercise, within of course the patient's own mobility limitations. I encouraged the patient to keep up with current events by reading the news paper or watching the news and to do word puzzles, or if feasible, to go on [email or web address].   As far as further diagnostic testing is concerned, I suggested the following: sleep study. We may pursue formal memory testing in the form of neuropsychological evaluation.  As far as medications are concerned, I recommended the following at this time: start Aricept.I talked about different medication options, I suggested we start with low-dose Aricept, generic, 5 mg daily, we talked about expectations, limitations, benefits and side effects.  I answered all their questions today and the patient and Sherlyn Hay  were in agreement with the above outlined plan. I would like to see the patient back in 3 months, sooner if the need arises and encouraged them to call with any interim questions, concerns, problems, updates and refill requests and/or test results.   Thank you very much for allowing me to participate in the care of this nice patient. If I can be of any further assistance to you please do not hesitate to call me at [phone].  Sincerely,   Star Age, MD, PhD  I spent 60 minutes in total face-to-face time with the patient, more than 50% of which was spent in counseling and coordination of care, reviewing test results, reviewing medication and discussing or reviewing the diagnosis of dementia, its prognosis and treatment options. Pertinent laboratory and imaging test  results that were available during this visit with the patient were  reviewed by me and considered in my medical decision making (see chart for details).

## 2019-05-10 ENCOUNTER — Ambulatory Visit: Payer: Medicare Other | Admitting: Family Medicine

## 2019-05-11 ENCOUNTER — Encounter: Payer: Self-pay | Admitting: Family Medicine

## 2019-05-11 ENCOUNTER — Ambulatory Visit (INDEPENDENT_AMBULATORY_CARE_PROVIDER_SITE_OTHER): Payer: Medicare Other | Admitting: Family Medicine

## 2019-05-11 VITALS — BP 110/76 | HR 65 | Temp 97.5°F | Ht 62.0 in | Wt 129.2 lb

## 2019-05-11 DIAGNOSIS — H9113 Presbycusis, bilateral: Secondary | ICD-10-CM | POA: Diagnosis not present

## 2019-05-11 NOTE — Progress Notes (Signed)
Established Patient Office Visit  Subjective:  Patient ID: Alejandra Davis, female    DOB: Apr 05, 1936  Age: 83 y.o. MRN: [id number]  CC: No chief complaint on file.   HPI Alejandra Davis presents for follow-up of her near emergency room visit for headache and elevated blood pressure.  Headache has since resolved and her blood pressure is much improved today.  Patient's medication continue to be laid out in a pillbox and there is reason to believe the patient has been compliant with her medications.  She presents with 1 of her other daughters who is concerned about the patient's reticence to wear her hearing aids and the effect that that may be having on her memory and ability to live independently.  Patient does not always hear the phone ringing when they are calling to check on her.  Patient continues to work crossword puzzles and has been compliant with her Aricept.  Past Medical History:  Diagnosis Date  . Chicken pox   . Cystocele   . Diabetes mellitus without complication ([place])   . Hypertension   . Thyroid disease    hypothyroidism    Past Surgical History:  Procedure Laterality Date  . ABDOMINAL HYSTERECTOMY  1990   TAH BSO  . OOPHORECTOMY     BSO  . Vaginal Bx     Papilloma    Family History  Problem Relation Age of Onset  . Sickle cell anemia Other   . Hypertension Mother   . Cancer Father        LIVER  . Heart disease Brother   . Cancer Brother        STOMACH    Social History   Socioeconomic History  . Marital status: Divorced    Spouse name: Not on file  . Number of children: Not on file  . Years of education: Not on file  . Highest education level: Not on file  Occupational History  . Not on file  Social Needs  . Financial resource strain: Not on file  . Food insecurity:    Worry: Not on file    Inability: Not on file  . Transportation needs:    Medical: Not on file    Non-medical: Not on file  Tobacco Use  . Smoking status: Never Smoker  .  Smokeless tobacco: Never Used  Substance and Sexual Activity  . Alcohol use: No  . Drug use: No  . Sexual activity: Not on file  Lifestyle  . Physical activity:    Days per week: Not on file    Minutes per session: Not on file  . Stress: Not on file  Relationships  . Social connections:    Talks on phone: Not on file    Gets together: Not on file    Attends religious service: Not on file    Active member of club or organization: Not on file    Attends meetings of clubs or organizations: Not on file    Relationship status: Not on file  . Intimate partner violence:    Fear of current or ex partner: Not on file    Emotionally abused: Not on file    Physically abused: Not on file    Forced sexual activity: Not on file  Other Topics Concern  . Not on file  Social History Narrative  . Not on file    Outpatient Medications Prior to Visit  Medication Sig Dispense Refill  . amLODipine (NORVASC) 10 MG tablet Take 1  tablet (10 mg total) by mouth daily. 90 tablet 1  . calcium-vitamin D (OSCAL 500/200 D-3) 500-200 MG-UNIT tablet Take 1 tablet by mouth 2 (two) times daily. 180 tablet 1  . donepezil (ARICEPT) 10 MG tablet Take 0.5 tablets (5 mg total) by mouth at bedtime. 30 tablet 3  . dorzolamide-timolol (COSOPT) 22.3-6.8 MG/ML ophthalmic solution Place 1 drop into the right eye 2 (two) times daily. 10 mL 1  . ketorolac (ACULAR) 0.5 % ophthalmic solution Place 1 drop into the right eye 4 (four) times daily. 10 mL 2  . levothyroxine (SYNTHROID, LEVOTHROID) 75 MCG tablet TAKE 1 TABLET(75 MCG) BY MOUTH DAILY (Patient taking differently: Take 75 mcg by mouth daily before breakfast. ) 90 tablet 1  . metFORMIN (GLUCOPHAGE-XR) 500 MG 24 hr tablet Take 1 tablet (500 mg total) by mouth at bedtime. 90 tablet 1  . metoprolol succinate (TOPROL-XL) 50 MG 24 hr tablet Take 1 tablet (50 mg total) by mouth daily. 90 tablet 1  . prednisoLONE acetate (PRED FORTE) 1 % ophthalmic suspension Place 1 drop into  the right eye 4 (four) times daily. 10 mL 2  . triamcinolone ointment (KENALOG) 0.1 % Apply 1 application topically 2 (two) times daily. For rash on thighs and arms. 30 g 0   Facility-Administered Medications Prior to Visit  Medication Dose Route Frequency Provider Last Rate Last Dose  . Bevacizumab (AVASTIN) SOLN 1.25 mg  1.25 mg Intravitreal  [name], MD   1.25 mg at 10/16/18 2335  . Bevacizumab (AVASTIN) SOLN 1.25 mg  1.25 mg Intravitreal  [name], MD   1.25 mg at 11/15/18 0939  . Bevacizumab (AVASTIN) SOLN 1.25 mg  1.25 mg Intravitreal  [name], MD   1.25 mg at 12/13/18 1542  . Bevacizumab (AVASTIN) SOLN 1.25 mg  1.25 mg Intravitreal  [name], MD   1.25 mg at 02/15/19 1725    No Known Allergies  ROS Review of Systems  Constitutional: Negative.   HENT: Positive for hearing loss. Negative for ear discharge, ear pain and facial swelling.   Respiratory: Negative.   Cardiovascular: Negative.   Gastrointestinal: Negative.   Psychiatric/Behavioral: Negative.  Negative for agitation, behavioral problems and confusion.      Objective:    Physical Exam  Constitutional: She is oriented to person, place, and time. She appears well-developed and well-nourished. No distress.  HENT:  Head: Normocephalic and atraumatic.  Right Ear: External ear normal.  Left Ear: External ear normal.  Mouth/Throat: Oropharynx is clear and moist. No oropharyngeal exudate.  Eyes: Conjunctivae are normal. Right eye exhibits no discharge. Left eye exhibits no discharge. No scleral icterus.  Neck: Neck supple. No JVD present. No tracheal deviation present. No thyromegaly present.  Cardiovascular: Normal rate, regular rhythm and normal heart sounds.  Pulmonary/Chest: Effort normal and breath sounds normal. No stridor.  Lymphadenopathy:    She has no cervical adenopathy.  Neurological: She is alert and oriented to person, place, and time.  Skin: Skin is warm and dry. She is not  diaphoretic.  Psychiatric: She has a normal mood and affect. Her behavior is normal.    There were no vitals taken for this visit. Wt Readings from Last 3 Encounters:  05/09/19 125 lb (56.7 kg)  05/01/19 135 lb (61.2 kg)  03/31/19 131 lb (59.4 kg)   BP Readings from Last 3 Encounters:  05/09/19 (!) 159/69  05/01/19 (!) 179/90  04/01/19 (!) 159/110   Guideline developer:  UpToDate (see UpToDate for funding source)  Date Released: June 2014  Health Maintenance Due  Topic Date Due  . FOOT EXAM  10/22/1948  . OPHTHALMOLOGY EXAM  10/22/1948  . TETANUS/TDAP  10/22/1957  . PNA vac Low Risk Adult (1 of 2 - PCV13) 10/23/2003    There are no preventive care reminders to display for this patient.  Lab Results  Component Value Date   TSH 1.32 03/13/2019   Lab Results  Component Value Date   WBC 5.7 05/01/2019   HGB 13.6 05/01/2019   HCT 42.2 05/01/2019   MCV 82.9 05/01/2019   PLT 271 05/01/2019   Lab Results  Component Value Date   NA 140 05/01/2019   K 3.8 05/01/2019   CO2 25 05/01/2019   GLUCOSE 115 (H) 05/01/2019   BUN 14 05/01/2019   CREATININE 1.10 (H) 05/01/2019   BILITOT 0.6 05/01/2019   ALKPHOS 69 05/01/2019   AST 21 05/01/2019   ALT 17 05/01/2019   PROT 7.8 05/01/2019   ALBUMIN 4.0 05/01/2019   CALCIUM 9.9 05/01/2019   ANIONGAP 11 05/01/2019   GFR 53.27 (L) 03/13/2019   Lab Results  Component Value Date   CHOL 180 09/06/2018   Lab Results  Component Value Date   HDL 74.60 09/06/2018   Lab Results  Component Value Date   LDLCALC 90 09/06/2018   Lab Results  Component Value Date   TRIG 76.0 09/06/2018   Lab Results  Component Value Date   CHOLHDL 2 09/06/2018   Lab Results  Component Value Date   HGBA1C 6.9 (H) 03/13/2019      Assessment & Plan:   Problem List Items Addressed This Visit    None      No orders of the defined types were placed in this encounter.   Follow-up: No follow-ups on file.   Encouraged patient to wear her  hearing aids for her own safety and the peace of mind of her daughters to call frequently to check on her.  Continue all medicines as above.  Follow-up for previously scheduled visit.

## 2019-05-23 NOTE — Progress Notes (Addendum)
[facility] Note  05/24/2019     CHIEF COMPLAINT Patient presents for Retina Evaluation   HISTORY OF PRESENT ILLNESS: Alejandra Davis is a 83 y.o. female who presents to the clinic today for:   HPI    Retina Evaluation    In right eye.  This started weeks ago.  Duration of weeks.  Associated Symptoms Pain.  Context:  distance vision.  I, the attending physician,  performed the HPI with the patient and updated documentation appropriately.          Comments    Patient states her vision is doing well.  Patient complains of occasional aching pain in her right eye.  She denies any new or worsening floaters or fol.       Last edited by [name], MD on 05/24/2019 10:44 AM. (History)       Lab Results  Component Value Date   HGBA1C 6.9 (H) 03/13/2019     Referring physician: Libby Maw, MD [place], [place] [id number]  HISTORICAL INFORMATION:   Selected notes from the MEDICAL RECORD NUMBER Referred by [name] for DM exam LEE-  Ocular Hx-  PMH- HTN, thyroid disease, DM (A1C- 6.5)    CURRENT MEDICATIONS: Current Outpatient Medications (Ophthalmic Drugs)  Medication Sig  . dorzolamide-timolol (COSOPT) 22.3-6.8 MG/ML ophthalmic solution Place 1 drop into the right eye 2 (two) times daily.  Marland Kitchen ketorolac (ACULAR) 0.5 % ophthalmic solution Place 1 drop into the right eye 4 (four) times daily.  . prednisoLONE acetate (PRED FORTE) 1 % ophthalmic suspension Place 1 drop into the right eye 4 (four) times daily.   No current facility-administered medications for this visit.  (Ophthalmic Drugs)   Current Outpatient Medications (Other)  Medication Sig  . amLODipine (NORVASC) 10 MG tablet Take 1 tablet (10 mg total) by mouth daily.  . calcium-vitamin D (OSCAL 500/200 D-3) 500-200 MG-UNIT tablet Take 1 tablet by mouth 2 (two) times daily.  Marland Kitchen donepezil (ARICEPT) 10 MG tablet Take 0.5 tablets (5 mg total) by mouth at  bedtime.  Marland Kitchen levothyroxine (SYNTHROID, LEVOTHROID) 75 MCG tablet TAKE 1 TABLET(75 MCG) BY MOUTH DAILY (Patient taking differently: Take 75 mcg by mouth daily before breakfast. )  . metFORMIN (GLUCOPHAGE-XR) 500 MG 24 hr tablet Take 1 tablet (500 mg total) by mouth at bedtime.  . metoprolol succinate (TOPROL-XL) 50 MG 24 hr tablet Take 1 tablet (50 mg total) by mouth daily.  Marland Kitchen triamcinolone ointment (KENALOG) 0.1 % Apply 1 application topically 2 (two) times daily. For rash on thighs and arms.   Current Facility-Administered Medications (Other)  Medication Route  . Bevacizumab (AVASTIN) SOLN 1.25 mg Intravitreal  . Bevacizumab (AVASTIN) SOLN 1.25 mg Intravitreal  . Bevacizumab (AVASTIN) SOLN 1.25 mg Intravitreal  . Bevacizumab (AVASTIN) SOLN 1.25 mg Intravitreal      REVIEW OF SYSTEMS: ROS    Positive for: Endocrine, Cardiovascular, Eyes   Negative for: Constitutional, Gastrointestinal, Neurological, Skin, Genitourinary, Musculoskeletal, HENT, Respiratory, Psychiatric, Allergic/Imm, Heme/Lymph   Last edited by Doneen Poisson on 05/24/2019 10:10 AM. (History)       ALLERGIES No Known Allergies  PAST MEDICAL HISTORY Past Medical History:  Diagnosis Date  . Chicken pox   . Cystocele   . Diabetes mellitus without complication ([place])   . Hypertension   . Thyroid disease    hypothyroidism   Past Surgical History:  Procedure Laterality Date  . ABDOMINAL HYSTERECTOMY  1990  TAH BSO  . OOPHORECTOMY     BSO  . Vaginal Bx     Papilloma    FAMILY HISTORY Family History  Problem Relation Age of Onset  . Sickle cell anemia Other   . Hypertension Mother   . Cancer Father        LIVER  . Heart disease Brother   . Cancer Brother        STOMACH    SOCIAL HISTORY Social History   Tobacco Use  . Smoking status: Never Smoker  . Smokeless tobacco: Never Used  Substance Use Topics  . Alcohol use: No  . Drug use: No         OPHTHALMIC EXAM:  Base Eye Exam     Visual Acuity (Snellen - Linear)      Right Left   Dist cc 20/40 -2 20/25 -2   Dist ph cc 20/25 -3 NI       Tonometry (Tonopen, 10:14 AM)      Right Left   Pressure 15 14       Pupils      Dark Light Shape React APD   Right 3 2 Round Minimal 0   Left 3 2 Round Minimal 0       Extraocular Movement      Right Left    Full Full       Neuro/Psych    Oriented x3:  Yes   Mood/Affect:  Normal       Dilation    Both eyes:  1.0% Mydriacyl, 2.5% Phenylephrine @ 10:14 AM        Slit Lamp and Fundus Exam    Slit Lamp Exam      Right Left   Lids/Lashes Dermatochalasis - upper lid, Meibomian gland dysfunction Dermatochalasis - upper lid, Meibomian gland dysfunction   Conjunctiva/Sclera Melanosis Melanosis   Cornea Arcus, 2+ Punctate epithelial erosions, mild Anterior basement membrane dystrophy, Well healed cataract wounds, tr Descemet's folds tempoally Arcus, Inferior 1+ Punctate epithelial erosions; 2-3+ microcystic edema temporal with 2+ descemet folds, Keratic precipitates.  Well healed temporal cat sx incision.   Anterior Chamber Deep, no cell or flare Deep;  no cell or flare   Iris Round and dilated, No NVI Round and dilated, No NVI   Lens PC IOL in good position Posterior chamber intraocular lens in good position.  1+ PCO.   Vitreous Vitreous syneresis Vitreous syneresis       Fundus Exam      Right Left   Disc Sharp rim, inferior notch, 1+ pallor, inf. Rim thinning, mild disc heme at 10:30 trace pallor   C/D Ratio 0.7 0.6   Macula Improved foveal reflex, IRH superior macula - improved, residual dot hemes ST macula, trace cystic changes centrally good foveal reflex, flat, RPE mottling, Epiretinal membrane, No heme or edema   Vessels Severe vascular attenuation superiorly, BRVO Mild vascular attenuation, AV crossing changes   Periphery Attached, superior DBH Attached, scattered Reticular degeneration        Refraction    Wearing Rx      Sphere Cylinder Axis Add    Right -0.75 +1.25 170 +2.50   Left -0.75 +1.00 003 +2.50   Type:  PAL          IMAGING AND PROCEDURES  Imaging and Procedures for 03/07/18  OCT, Retina - OU - Both Eyes       Right Eye Quality was good. Central Foveal Thickness: 263. Progression has worsened. Findings include abnormal  foveal contour, no SRF, epiretinal membrane, normal foveal contour, intraretinal fluid (Mild Interval increase in IRF, trace cystic changes, blunted foveal contour).   Left Eye Quality was good. Central Foveal Thickness: 266. Progression has been stable. Findings include abnormal foveal contour, epiretinal membrane, no IRF, no SRF, vitreomacular adhesion  (Blunted foveal reflex).   Notes *Images captured and stored on drive  Diagnosis / Impression:  ERM OU OD: mild interval increase in central IRF/CME OS: very mild ERM with blunted foveal reflex -- stable; mild VMA  Clinical management:  See below  Abbreviations: NFP - Normal foveal profile. CME - cystoid macular edema. PED - pigment epithelial detachment. IRF - intraretinal fluid. SRF - subretinal fluid. EZ - ellipsoid zone. ERM - epiretinal membrane. ORA - outer retinal atrophy. ORT - outer retinal tubulation. SRHM - subretinal hyper-reflective material         Intravitreal Injection, Pharmacologic Agent - OD - Right Eye       Time Out 05/24/2019. 10:50 AM. Confirmed correct patient, procedure, site, and patient consented.   Anesthesia Topical anesthesia was used. Anesthetic medications included Lidocaine 2%, Tetracaine 0.5%.   Procedure Preparation included 5% betadine to ocular surface. A 30 gauge needle was used.   Injection:  1.25 mg Bevacizumab (AVASTIN) SOLN   NDC: 16109-604-54, Lot: [id number] , Expiration date: 06/09/2019   Route: Intravitreal, Site: Right Eye, Waste: 0 mL  Post-op Post injection exam found visual acuity of at least counting fingers. The patient tolerated the procedure well. There were no  complications. The patient received written and verbal post procedure care education.                 ASSESSMENT/PLAN:    ICD-10-CM   1. Branch retinal vein occlusion of right eye with macular edema H34.8310 Intravitreal Injection, Pharmacologic Agent - OD - Right Eye    Bevacizumab (AVASTIN) SOLN 1.25 mg  2. Diabetes mellitus type 2 without retinopathy ([place]) E11.9   3. Epiretinal membrane (ERM) of both eyes H35.373   4. Retinal edema H35.81 OCT, Retina - OU - Both Eyes  5. Pseudophakia of both eyes Z96.1   6. Ocular hypertension of right eye H40.051   7. Glaucoma suspect of right eye H40.001    1. BRVO with CME OD  - at presentation, BCVA 20/60 OD -- down from 20/30  - initial OCT w/ CME superior macula  - s/p IVA OD #1 (10.18.19), #2 (11.19.19), #3 (12.17.19), #4 (02.19.20), #5 (03.23.20), #6 (04.21.20)  - good response to medications, but held IVA in January due to recent cataract surgery  - today, BCVA 20/25 OD, OCT w/ interval increase in IRF/cystic changes  - recommend IVA OD #7 today (05.27.20)    - RBA of procedure discussed, questions answered  - informed consent obtained and signed  - see procedure note  - F/U 5 weeks -- DFE/OCT/possible injection  2. Diabetes mellitus, type 2 without retinopathy  - The incidence, risk factors for progression, natural history and treatment options for diabetic retinopathy  were discussed with patient.    - The need for close monitoring of blood glucose, blood pressure, and serum lipids, avoiding cigarette or any type of tobacco, and the need for long term follow up was also discussed with patient.  3,4. Epiretinal membrane, OU  - relatively mild ERM OU -- blunted central foveal depression  - OD with mild cystic changes 2/2 BRVO as above; OS without cystic changes or edema  - discussed findings and prognosis  - recommend  monitoring for now  5. Pseudophakia OU  - s/p CE/IOL OS 11.13.19 w/ Dr. [name]  - s/p CE/IOL OD 01.16.20 w/  Dr. [name]  - beautiful surgeries -- IOLs in perfect position  - healing well post-operatively  - IRF/CME OD may have been partly due to post op CME ([place]) in addition to BRVO  - cont PF taper -- 1 drop daily for 2 weeks, then stop  6,7. Ocular hypertension / Glaucoma suspect OD>OS-   - IOP ~25 OD, 20 OS at both prior clinic visits  - today IOP improved to 14 OS, OD is 15-- on cosopt BID OU  - denies any family hx of glaucoma  - under the expert care of Dr. [name] who is actively managing  - cont Cosopt BID OU  Ophthalmic Meds Ordered this visit:  Meds ordered this encounter  Medications  . dorzolamide-timolol (COSOPT) 22.3-6.8 MG/ML ophthalmic solution    Sig: Place 1 drop into the right eye 2 (two) times daily.    Dispense:  10 mL    Refill:  4  . Bevacizumab (AVASTIN) SOLN 1.25 mg       Return in about 5 weeks (around 06/28/2019) for f/u BRVO OD, DFE, OCT.  There are no Patient Instructions on file for this visit.   Explained the diagnoses, plan, and follow up with the patient and they expressed understanding.  Patient expressed understanding of the importance of proper follow up care.   This document serves as a record of services personally performed by Gardiner Sleeper, MD, PhD. It was created on their behalf by Ernest Mallick, OA, an ophthalmic assistant. The creation of this record is the provider's dictation and/or activities during the visit.    Electronically signed by: Ernest Mallick, OA  05.26.2020 1:30 PM     Gardiner Sleeper, M.D., Ph.D. Diseases & Surgery of the Retina and Vitreous [facility]  I have reviewed the above documentation for accuracy and completeness, and I agree with the above. Gardiner Sleeper, M.D., Ph.D. 05/24/19 1:30 PM   Abbreviations: M myopia (nearsighted); A astigmatism; H hyperopia (farsighted); P presbyopia; Mrx spectacle prescription;  CTL contact lenses; OD right eye; OS left eye; OU both eyes  XT exotropia;  ET esotropia; PEK punctate epithelial keratitis; PEE punctate epithelial erosions; DES dry eye syndrome; MGD meibomian gland dysfunction; ATs artificial tears; PFAT's preservative free artificial tears; [place] nuclear sclerotic cataract; PSC posterior subcapsular cataract; ERM epi-retinal membrane; PVD posterior vitreous detachment; RD retinal detachment; DM diabetes mellitus; DR diabetic retinopathy; NPDR non-proliferative diabetic retinopathy; PDR proliferative diabetic retinopathy; CSME clinically significant macular edema; DME diabetic macular edema; dbh dot blot hemorrhages; CWS cotton wool spot; POAG primary open angle glaucoma; C/D cup-to-disc ratio; HVF humphrey visual field; GVF goldmann visual field; OCT optical coherence tomography; IOP intraocular pressure; BRVO Branch retinal vein occlusion; CRVO central retinal vein occlusion; CRAO central retinal artery occlusion; BRAO branch retinal artery occlusion; RT retinal tear; SB scleral buckle; PPV pars plana vitrectomy; VH Vitreous hemorrhage; PRP panretinal laser photocoagulation; IVK intravitreal kenalog; VMT vitreomacular traction; MH Macular hole;  NVD neovascularization of the disc; NVE neovascularization elsewhere; AREDS age related eye disease study; ARMD age related macular degeneration; POAG primary open angle glaucoma; EBMD epithelial/anterior basement membrane dystrophy; ACIOL anterior chamber intraocular lens; IOL intraocular lens; PCIOL posterior chamber intraocular lens; Phaco/IOL phacoemulsification with intraocular lens placement; [place] photorefractive keratectomy; LASIK laser assisted in situ keratomileusis; HTN hypertension; DM diabetes mellitus; COPD chronic obstructive pulmonary disease

## 2019-05-24 ENCOUNTER — Telehealth: Payer: Self-pay

## 2019-05-24 ENCOUNTER — Other Ambulatory Visit: Payer: Self-pay

## 2019-05-24 ENCOUNTER — Encounter (INDEPENDENT_AMBULATORY_CARE_PROVIDER_SITE_OTHER): Payer: Self-pay | Admitting: Ophthalmology

## 2019-05-24 ENCOUNTER — Ambulatory Visit (INDEPENDENT_AMBULATORY_CARE_PROVIDER_SITE_OTHER): Payer: Medicare Other | Admitting: Ophthalmology

## 2019-05-24 DIAGNOSIS — H35373 Puckering of macula, bilateral: Secondary | ICD-10-CM

## 2019-05-24 DIAGNOSIS — H34831 Tributary (branch) retinal vein occlusion, right eye, with macular edema: Secondary | ICD-10-CM

## 2019-05-24 DIAGNOSIS — E119 Type 2 diabetes mellitus without complications: Secondary | ICD-10-CM | POA: Diagnosis not present

## 2019-05-24 DIAGNOSIS — H40001 Preglaucoma, unspecified, right eye: Secondary | ICD-10-CM

## 2019-05-24 DIAGNOSIS — H3581 Retinal edema: Secondary | ICD-10-CM

## 2019-05-24 DIAGNOSIS — H40051 Ocular hypertension, right eye: Secondary | ICD-10-CM

## 2019-05-24 DIAGNOSIS — Z961 Presence of intraocular lens: Secondary | ICD-10-CM

## 2019-05-24 MED ORDER — DORZOLAMIDE HCL-TIMOLOL MAL 2-0.5 % OP SOLN: 1.0000 [drp] | Freq: Two times a day (BID) | OPHTHALMIC | 4 refills | Status: DC

## 2019-05-24 MED ORDER — BEVACIZUMAB CHEMO INJECTION 1.25MG/0.05ML SYRINGE FOR KALEIDOSCOPE
1.2500 mg | INTRAVITREAL | Status: AC | PRN
  Administered 2019-05-24: 1.25 mg via INTRAVITREAL

## 2019-05-24 NOTE — Telephone Encounter (Signed)
Spoke with daughter, Sherlyn Hay, about scheduling patient for in lab sleep study. She states that they want to hold off scheduling right now, that it is not an emergency. They will wait for next visit with MD to talk about scheduling.

## 2019-05-30 ENCOUNTER — Encounter: Payer: Self-pay | Admitting: Family Medicine

## 2019-05-30 ENCOUNTER — Ambulatory Visit (INDEPENDENT_AMBULATORY_CARE_PROVIDER_SITE_OTHER): Payer: Medicare Other | Admitting: Family Medicine

## 2019-05-30 DIAGNOSIS — L2089 Other atopic dermatitis: Secondary | ICD-10-CM | POA: Diagnosis not present

## 2019-05-30 DIAGNOSIS — L300 Nummular dermatitis: Secondary | ICD-10-CM

## 2019-05-30 MED ORDER — TRIAMCINOLONE ACETONIDE 0.1 % EX OINT: 1.0000 "application " | TOPICAL_OINTMENT | Freq: Two times a day (BID) | CUTANEOUS | 0 refills | Status: DC

## 2019-05-30 NOTE — Progress Notes (Signed)
Virtual Visit via Telephone Note  I connected with Alejandra Davis on 05/30/19 at  2:30 PM EDT by telephone and verified that I am speaking with the correct person using two identifiers.  Location: Patient: home Provider:    Established Patient Office Visit  Subjective:  Patient ID: Alejandra Davis, female    DOB: 1936-05-04  Age: 83 y.o. MRN: [id number]  CC:  Chief Complaint  Patient presents with  . Rash    on right arm & back    HPI Alejandra Davis presents for evaluation and treatment of a 2 to 3-day history of palpable, pruritic, fixed and nonpainful small bumps on the volar surface of her right forearm.  Denies fever chills antecedent pain, chemical exposure or recent insect bites.  Predominant symptom is pruritus.   Past Medical History:  Diagnosis Date  . Chicken pox   . Cystocele   . Diabetes mellitus without complication ([place])   . Hypertension   . Thyroid disease    hypothyroidism    Past Surgical History:  Procedure Laterality Date  . ABDOMINAL HYSTERECTOMY  1990   TAH BSO  . OOPHORECTOMY     BSO  . Vaginal Bx     Papilloma    Family History  Problem Relation Age of Onset  . Sickle cell anemia Other   . Hypertension Mother   . Cancer Father        LIVER  . Heart disease Brother   . Cancer Brother        STOMACH    Social History   Socioeconomic History  . Marital status: Divorced    Spouse name: Not on file  . Number of children: Not on file  . Years of education: Not on file  . Highest education level: Not on file  Occupational History  . Not on file  Social Needs  . Financial resource strain: Not on file  . Food insecurity:    Worry: Not on file    Inability: Not on file  . Transportation needs:    Medical: Not on file    Non-medical: Not on file  Tobacco Use  . Smoking status: Never Smoker  . Smokeless tobacco: Never Used  Substance and Sexual Activity  . Alcohol use: No  . Drug use: No  . Sexual activity: Not on file  Lifestyle   . Physical activity:    Days per week: Not on file    Minutes per session: Not on file  . Stress: Not on file  Relationships  . Social connections:    Talks on phone: Not on file    Gets together: Not on file    Attends religious service: Not on file    Active member of club or organization: Not on file    Attends meetings of clubs or organizations: Not on file    Relationship status: Not on file  . Intimate partner violence:    Fear of current or ex partner: Not on file    Emotionally abused: Not on file    Physically abused: Not on file    Forced sexual activity: Not on file  Other Topics Concern  . Not on file  Social History Narrative  . Not on file    Outpatient Medications Prior to Visit  Medication Sig Dispense Refill  . amLODipine (NORVASC) 10 MG tablet Take 1 tablet (10 mg total) by mouth daily. 90 tablet 1  . calcium-vitamin D (OSCAL 500/200 D-3) 500-200 MG-UNIT tablet Take  1 tablet by mouth 2 (two) times daily. 180 tablet 1  . donepezil (ARICEPT) 10 MG tablet Take 0.5 tablets (5 mg total) by mouth at bedtime. 30 tablet 3  . dorzolamide-timolol (COSOPT) 22.3-6.8 MG/ML ophthalmic solution Place 1 drop into the right eye 2 (two) times daily. 10 mL 4  . ketorolac (ACULAR) 0.5 % ophthalmic solution Place 1 drop into the right eye 4 (four) times daily. 10 mL 2  . levothyroxine (SYNTHROID, LEVOTHROID) 75 MCG tablet TAKE 1 TABLET(75 MCG) BY MOUTH DAILY (Patient taking differently: Take 75 mcg by mouth daily before breakfast. ) 90 tablet 1  . metFORMIN (GLUCOPHAGE-XR) 500 MG 24 hr tablet Take 1 tablet (500 mg total) by mouth at bedtime. 90 tablet 1  . metoprolol succinate (TOPROL-XL) 50 MG 24 hr tablet Take 1 tablet (50 mg total) by mouth daily. 90 tablet 1  . prednisoLONE acetate (PRED FORTE) 1 % ophthalmic suspension Place 1 drop into the right eye 4 (four) times daily. 10 mL 2  . triamcinolone ointment (KENALOG) 0.1 % Apply 1 application topically 2 (two) times daily. For  rash on thighs and arms. 30 g 0   Facility-Administered Medications Prior to Visit  Medication Dose Route Frequency Provider Last Rate Last Dose  . Bevacizumab (AVASTIN) SOLN 1.25 mg  1.25 mg Intravitreal  [name], MD   1.25 mg at 10/16/18 2335  . Bevacizumab (AVASTIN) SOLN 1.25 mg  1.25 mg Intravitreal  [name], MD   1.25 mg at 11/15/18 0939  . Bevacizumab (AVASTIN) SOLN 1.25 mg  1.25 mg Intravitreal  [name], MD   1.25 mg at 12/13/18 1542  . Bevacizumab (AVASTIN) SOLN 1.25 mg  1.25 mg Intravitreal  [name], MD   1.25 mg at 02/15/19 1725    No Known Allergies  ROS Review of Systems  Constitutional: Negative for diaphoresis, fatigue, fever and unexpected weight change.  HENT: Negative.   Eyes: Negative for photophobia and visual disturbance.  Respiratory: Negative.   Cardiovascular: Negative.   Gastrointestinal: Negative.   Musculoskeletal: Negative for arthralgias and myalgias.  Skin: Positive for rash.  Neurological: Negative for weakness, numbness and headaches.  Hematological: Does not bruise/bleed easily.  Psychiatric/Behavioral: Negative.       Objective:    Physical Exam  Constitutional: She is oriented to person, place, and time. No distress.  Pulmonary/Chest: Effort normal.  Neurological: She is alert and oriented to person, place, and time.  Psychiatric: She has a normal mood and affect. Her behavior is normal.    There were no vitals taken for this visit. Wt Readings from Last 3 Encounters:  05/11/19 129 lb 4 oz (58.6 kg)  05/09/19 125 lb (56.7 kg)  05/01/19 135 lb (61.2 kg)     Health Maintenance Due  Topic Date Due  . FOOT EXAM  10/22/1948  . OPHTHALMOLOGY EXAM  10/22/1948  . TETANUS/TDAP  10/22/1957  . PNA vac Low Risk Adult (1 of 2 - PCV13) 10/23/2003    There are no preventive care reminders to display for this patient.  Lab Results  Component Value Date   TSH 1.32 03/13/2019   Lab Results  Component Value Date   WBC  5.7 05/01/2019   HGB 13.6 05/01/2019   HCT 42.2 05/01/2019   MCV 82.9 05/01/2019   PLT 271 05/01/2019   Lab Results  Component Value Date   NA 140 05/01/2019   K 3.8 05/01/2019   CO2 25 05/01/2019   GLUCOSE 115 (H) 05/01/2019  BUN 14 05/01/2019   CREATININE 1.10 (H) 05/01/2019   BILITOT 0.6 05/01/2019   ALKPHOS 69 05/01/2019   AST 21 05/01/2019   ALT 17 05/01/2019   PROT 7.8 05/01/2019   ALBUMIN 4.0 05/01/2019   CALCIUM 9.9 05/01/2019   ANIONGAP 11 05/01/2019   GFR 53.27 (L) 03/13/2019   Lab Results  Component Value Date   CHOL 180 09/06/2018   Lab Results  Component Value Date   HDL 74.60 09/06/2018   Lab Results  Component Value Date   LDLCALC 90 09/06/2018   Lab Results  Component Value Date   TRIG 76.0 09/06/2018   Lab Results  Component Value Date   CHOLHDL 2 09/06/2018   Lab Results  Component Value Date   HGBA1C 6.9 (H) 03/13/2019      Assessment & Plan:   Problem List Items Addressed This Visit      Musculoskeletal and Integument   Flexural atopic dermatitis - Primary   Relevant Medications   triamcinolone ointment (KENALOG) 0.1 %    Other Visit Diagnoses    Nummular eczema          Meds ordered this encounter  Medications  . triamcinolone ointment (KENALOG) 0.1 %    Sig: Apply 1 application topically 2 (two) times daily. For rash on thighs and arms.    Dispense:  30 g    Refill:  0    Follow-up: Return in about 1 week (around 06/06/2019), or if symptoms worsen or fail to improve.    Libby Maw, MD   I discussed the limitations, risks, security and privacy concerns of performing an evaluation and management service by telephone and the availability of in person appointments. I also discussed with the patient that there may be a patient responsible charge related to this service. The patient expressed understanding and agreed to proceed.  Interactive video and audio telecommunications were attempted between myself and the  patient. However they failed due to the patient having technical difficulties or not having access to video capability. We continued and completed with audio only.   History of Present Illness:    Observations/Objective:   Assessment and Plan:   Follow Up Instructions:    I discussed the assessment and treatment plan with the patient. The patient was provided an opportunity to ask questions and all were answered. The patient agreed with the plan and demonstrated an understanding of the instructions.   The patient was advised to call back or seek an in-person evaluation if the symptoms worsen or if the condition fails to improve as anticipated.  I provided 17 minutes of non-face-to-face time during this encounter.

## 2019-06-19 ENCOUNTER — Encounter: Payer: Self-pay | Admitting: Family Medicine

## 2019-06-19 ENCOUNTER — Ambulatory Visit (INDEPENDENT_AMBULATORY_CARE_PROVIDER_SITE_OTHER): Payer: Medicare Other | Admitting: Family Medicine

## 2019-06-19 DIAGNOSIS — R51 Headache: Secondary | ICD-10-CM

## 2019-06-19 DIAGNOSIS — H5711 Ocular pain, right eye: Secondary | ICD-10-CM

## 2019-06-19 DIAGNOSIS — R519 Headache, unspecified: Secondary | ICD-10-CM

## 2019-06-19 NOTE — Progress Notes (Signed)
Established Patient Office Visit  Subjective:  Patient ID: Alejandra Davis, female    DOB: 1936/11/01  Age: 83 y.o. MRN: [id number]  CC:  Chief Complaint  Patient presents with  . Headache    since Sunday    HPI Alejandra Davis presents for evaluation of a 2-day history of headache around her right eye.  There is been some mild pain in the eye.  and mildly blurred vision.  There is been mild photosensitivity.  Patient denies injury.  There is been no fever chills, stuffy nose, drainage, sinus pressure or cough.  Headache  to has responded toA dvil.  Headache is improved today.  There is some lingering blurred vision.  Patient denies showering of lights.  She has a history of headaches.  She had a similar episode 2 weeks ago.  Last eye check was 2 years ago.  There is been no redness of the eye or discharge.  Past Medical History:  Diagnosis Date  . Chicken pox   . Cystocele   . Diabetes mellitus without complication ([employer])   . Hypertension   . Thyroid disease    hypothyroidism    Past Surgical History:  Procedure Laterality Date  . ABDOMINAL HYSTERECTOMY  1990   TAH BSO  . OOPHORECTOMY     BSO  . Vaginal Bx     Papilloma    Family History  Problem Relation Age of Onset  . Sickle cell anemia Other   . Hypertension Mother   . Cancer Father        LIVER  . Heart disease Brother   . Cancer Brother        STOMACH    Social History   Socioeconomic History  . Marital status: Divorced    Spouse name: Not on file  . Number of children: Not on file  . Years of education: Not on file  . Highest education level: Not on file  Occupational History  . Not on file  Social Needs  . Financial resource strain: Not on file  . Food insecurity    Worry: Not on file    Inability: Not on file  . Transportation needs    Medical: Not on file    Non-medical: Not on file  Tobacco Use  . Smoking status: Never Smoker  . Smokeless tobacco: Never Used  Substance and Sexual Activity   . Alcohol use: No  . Drug use: No  . Sexual activity: Not on file  Lifestyle  . Physical activity    Days per week: Not on file    Minutes per session: Not on file  . Stress: Not on file  Relationships  . Social Herbalist on phone: Not on file    Gets together: Not on file    Attends religious service: Not on file    Active member of club or organization: Not on file    Attends meetings of clubs or organizations: Not on file    Relationship status: Not on file  . Intimate partner violence    Fear of current or ex partner: Not on file    Emotionally abused: Not on file    Physically abused: Not on file    Forced sexual activity: Not on file  Other Topics Concern  . Not on file  Social History Narrative  . Not on file    Outpatient Medications Prior to Visit  Medication Sig Dispense Refill  . amLODipine (NORVASC) 10 MG  tablet Take 1 tablet (10 mg total) by mouth daily. 90 tablet 1  . calcium-vitamin D (OSCAL 500/200 D-3) 500-200 MG-UNIT tablet Take 1 tablet by mouth 2 (two) times daily. 180 tablet 1  . donepezil (ARICEPT) 10 MG tablet Take 0.5 tablets (5 mg total) by mouth at bedtime. 30 tablet 3  . dorzolamide-timolol (COSOPT) 22.3-6.8 MG/ML ophthalmic solution Place 1 drop into the right eye 2 (two) times daily. 10 mL 4  . ketorolac (ACULAR) 0.5 % ophthalmic solution Place 1 drop into the right eye 4 (four) times daily. 10 mL 2  . levothyroxine (SYNTHROID, LEVOTHROID) 75 MCG tablet TAKE 1 TABLET(75 MCG) BY MOUTH DAILY (Patient taking differently: Take 75 mcg by mouth daily before breakfast. ) 90 tablet 1  . metFORMIN (GLUCOPHAGE-XR) 500 MG 24 hr tablet Take 1 tablet (500 mg total) by mouth at bedtime. 90 tablet 1  . metoprolol succinate (TOPROL-XL) 50 MG 24 hr tablet Take 1 tablet (50 mg total) by mouth daily. 90 tablet 1  . prednisoLONE acetate (PRED FORTE) 1 % ophthalmic suspension Place 1 drop into the right eye 4 (four) times daily. 10 mL 2  . triamcinolone  ointment (KENALOG) 0.1 % Apply 1 application topically 2 (two) times daily. For rash on thighs and arms. 30 g 0   Facility-Administered Medications Prior to Visit  Medication Dose Route Frequency Provider Last Rate Last Dose  . Bevacizumab (AVASTIN) SOLN 1.25 mg  1.25 mg Intravitreal  [name], MD   1.25 mg at 10/16/18 2335  . Bevacizumab (AVASTIN) SOLN 1.25 mg  1.25 mg Intravitreal  [name], MD   1.25 mg at 11/15/18 0939  . Bevacizumab (AVASTIN) SOLN 1.25 mg  1.25 mg Intravitreal  [name], MD   1.25 mg at 12/13/18 1542  . Bevacizumab (AVASTIN) SOLN 1.25 mg  1.25 mg Intravitreal  [name], MD   1.25 mg at 02/15/19 1725    No Known Allergies  ROS Review of Systems  Constitutional: Negative for chills, diaphoresis, fatigue, fever and unexpected weight change.  HENT: Negative.   Eyes: Positive for photophobia and visual disturbance. Negative for redness.  Respiratory: Negative.   Cardiovascular: Negative.   Genitourinary: Negative.   Skin: Negative for pallor and rash.  Neurological: Positive for headaches. Negative for dizziness, facial asymmetry, speech difficulty, weakness, light-headedness and numbness.  Hematological: Does not bruise/bleed easily.  Psychiatric/Behavioral: Negative.       Objective:    Physical Exam  Constitutional: She is oriented to person, place, and time. No distress.  Pulmonary/Chest: Effort normal.  Neurological: She is alert and oriented to person, place, and time.  Psychiatric: She has a normal mood and affect. Her behavior is normal.    There were no vitals taken for this visit. Wt Readings from Last 3 Encounters:  05/11/19 129 lb 4 oz (58.6 kg)  05/09/19 125 lb (56.7 kg)  05/01/19 135 lb (61.2 kg)   BP Readings from Last 3 Encounters:  05/11/19 110/76  05/09/19 (!) 159/69  05/01/19 (!) 179/90   Guideline developer:  UpToDate (see UpToDate for funding source) Date Released: June 2014  Health Maintenance Due  Topic  Date Due  . FOOT EXAM  10/22/1948  . OPHTHALMOLOGY EXAM  10/22/1948  . TETANUS/TDAP  10/22/1957  . PNA vac Low Risk Adult (1 of 2 - PCV13) 10/23/2003    There are no preventive care reminders to display for this patient.  Lab Results  Component Value Date   TSH 1.32 03/13/2019  Lab Results  Component Value Date   WBC 5.7 05/01/2019   HGB 13.6 05/01/2019   HCT 42.2 05/01/2019   MCV 82.9 05/01/2019   PLT 271 05/01/2019   Lab Results  Component Value Date   NA 140 05/01/2019   K 3.8 05/01/2019   CO2 25 05/01/2019   GLUCOSE 115 (H) 05/01/2019   BUN 14 05/01/2019   CREATININE 1.10 (H) 05/01/2019   BILITOT 0.6 05/01/2019   ALKPHOS 69 05/01/2019   AST 21 05/01/2019   ALT 17 05/01/2019   PROT 7.8 05/01/2019   ALBUMIN 4.0 05/01/2019   CALCIUM 9.9 05/01/2019   ANIONGAP 11 05/01/2019   GFR 53.27 (L) 03/13/2019   Lab Results  Component Value Date   CHOL 180 09/06/2018   Lab Results  Component Value Date   HDL 74.60 09/06/2018   Lab Results  Component Value Date   LDLCALC 90 09/06/2018   Lab Results  Component Value Date   TRIG 76.0 09/06/2018   Lab Results  Component Value Date   CHOLHDL 2 09/06/2018   Lab Results  Component Value Date   HGBA1C 6.9 (H) 03/13/2019      Assessment & Plan:   Problem List Items Addressed This Visit    None    Visit Diagnoses    Headache above the eye region    -  Primary   Pain of right eye       Relevant Orders   Ambulatory referral to Ophthalmology      No orders of the defined types were placed in this encounter.   Follow-up: No follow-ups on file.   Have requested an urgent referral to her eye doctor.  She is to go to the emergency room if anything changes for the worse.  This was discussed also with her son.  Virtual Visit via Video Note  I connected with [name] on 06/19/19 at  1:00 PM EDT by a video enabled telemedicine application and verified that I am speaking with the correct person using two  identifiers.  Location: Patient: home Provider:   I discussed the limitations of evaluation and management by telemedicine and the availability of in person appointments. The patient expressed understanding and agreed to proceed.  History of Present Illness:    Observations/Objective:   Assessment and Plan:   Follow Up Instructions:    I discussed the assessment and treatment plan with the patient. The patient was provided an opportunity to ask questions and all were answered. The patient agreed with the plan and demonstrated an understanding of the instructions.   The patient was advised to call back or seek an in-person evaluation if the symptoms worsen or if the condition fails to improve as anticipated.  Interactive video and audio telecommunications were attempted between myself and the patient. However they failed due to the patient having technical difficulties or not having access to video capability. We continued and completed with audio only.  I provided 23 minutes of non-face-to-face time during this encounter.   Libby Maw, MD

## 2019-06-20 ENCOUNTER — Encounter (INDEPENDENT_AMBULATORY_CARE_PROVIDER_SITE_OTHER): Payer: Self-pay | Admitting: Ophthalmology

## 2019-06-20 ENCOUNTER — Telehealth: Payer: Self-pay

## 2019-06-20 NOTE — Telephone Encounter (Signed)
Copied from [place] [phone]. Topic: General - Inquiry >> Jun 20, 2019 11:32 AM Mathis Bud wrote: Reason for CRM: Patient's daughter called stating she did not know about her mothers referral to Ambulatory referral to Ophthalmology.  Daughter states patient has early stages of dementia and cannot remember why she called and made appt with PCP yesterday 6/22.  Patients daughter Sherlyn Hay is requesting pcp or nurse to call her to discuss what went on yesterday during telephone visit.    Shareka Casale  [phone]

## 2019-06-20 NOTE — Telephone Encounter (Signed)
Patient has been referred to opthamology for an eye check. She was advised to go to the er if worse. This was discussed also with her son.

## 2019-06-20 NOTE — Telephone Encounter (Signed)
I spoke with pt's daughter. We went over information below. Pt already has an eye doctor, so pt's daughter will make an appointment with them.

## 2019-06-21 ENCOUNTER — Ambulatory Visit (INDEPENDENT_AMBULATORY_CARE_PROVIDER_SITE_OTHER): Payer: Medicare Other | Admitting: Family Medicine

## 2019-06-21 ENCOUNTER — Other Ambulatory Visit: Payer: Self-pay

## 2019-06-21 ENCOUNTER — Encounter: Payer: Self-pay | Admitting: Family Medicine

## 2019-06-21 ENCOUNTER — Encounter (INDEPENDENT_AMBULATORY_CARE_PROVIDER_SITE_OTHER): Payer: Self-pay | Admitting: Ophthalmology

## 2019-06-21 ENCOUNTER — Ambulatory Visit (INDEPENDENT_AMBULATORY_CARE_PROVIDER_SITE_OTHER): Payer: Medicare Other | Admitting: Ophthalmology

## 2019-06-21 ENCOUNTER — Other Ambulatory Visit: Payer: Self-pay | Admitting: Family Medicine

## 2019-06-21 VITALS — BP 201/100

## 2019-06-21 DIAGNOSIS — H3581 Retinal edema: Secondary | ICD-10-CM

## 2019-06-21 DIAGNOSIS — H35033 Hypertensive retinopathy, bilateral: Secondary | ICD-10-CM

## 2019-06-21 DIAGNOSIS — H34831 Tributary (branch) retinal vein occlusion, right eye, with macular edema: Secondary | ICD-10-CM | POA: Diagnosis not present

## 2019-06-21 DIAGNOSIS — H40051 Ocular hypertension, right eye: Secondary | ICD-10-CM

## 2019-06-21 DIAGNOSIS — H40001 Preglaucoma, unspecified, right eye: Secondary | ICD-10-CM

## 2019-06-21 DIAGNOSIS — R519 Headache, unspecified: Secondary | ICD-10-CM

## 2019-06-21 DIAGNOSIS — R51 Headache: Secondary | ICD-10-CM

## 2019-06-21 DIAGNOSIS — Z961 Presence of intraocular lens: Secondary | ICD-10-CM

## 2019-06-21 DIAGNOSIS — I10 Essential (primary) hypertension: Secondary | ICD-10-CM

## 2019-06-21 DIAGNOSIS — E119 Type 2 diabetes mellitus without complications: Secondary | ICD-10-CM

## 2019-06-21 DIAGNOSIS — H35373 Puckering of macula, bilateral: Secondary | ICD-10-CM | POA: Diagnosis not present

## 2019-06-21 MED ORDER — CHLORTHALIDONE 25 MG PO TABS: 12.5000 mg | ORAL_TABLET | Freq: Every day | ORAL | 0 refills | Status: DC

## 2019-06-21 MED ORDER — BRIMONIDINE TARTRATE 0.2 % OP SOLN: 1.0000 [drp] | Freq: Two times a day (BID) | OPHTHALMIC | 3 refills | Status: DC

## 2019-06-21 NOTE — Patient Instructions (Signed)
DASH Eating Plan  DASH stands for "Dietary Approaches to Stop Hypertension." The DASH eating plan is a healthy eating plan that has been shown to reduce high blood pressure (hypertension). It may also reduce your risk for type 2 diabetes, heart disease, and stroke. The DASH eating plan may also help with weight loss.  What are tips for following this plan?    General guidelines   Avoid eating more than 2,300 mg (milligrams) of salt (sodium) a day. If you have hypertension, you may need to reduce your sodium intake to 1,500 mg a day.   Limit alcohol intake to no more than 1 drink a day for nonpregnant women and 2 drinks a day for men. One drink equals 12 oz of beer, 5 oz of wine, or 1 oz of hard liquor.   Work with your health care provider to maintain a healthy body weight or to lose weight. Ask what an ideal weight is for you.   Get at least 30 minutes of exercise that causes your heart to beat faster (aerobic exercise) most days of the week. Activities may include walking, swimming, or biking.   Work with your health care provider or diet and nutrition specialist (dietitian) to adjust your eating plan to your individual calorie needs.  Reading food labels     Check food labels for the amount of sodium per serving. Choose foods with less than 5 percent of the Daily Value of sodium. Generally, foods with less than 300 mg of sodium per serving fit into this eating plan.   To find whole grains, look for the word "whole" as the first word in the ingredient list.  Shopping   Buy products labeled as "low-sodium" or "no salt added."   Buy fresh foods. Avoid canned foods and premade or frozen meals.  Cooking   Avoid adding salt when cooking. Use salt-free seasonings or herbs instead of table salt or sea salt. Check with your health care provider or pharmacist before using salt substitutes.   Do not fry foods. Cook foods using healthy methods such as baking, boiling, grilling, and broiling instead.   Cook with  heart-healthy oils, such as olive, canola, soybean, or sunflower oil.  Meal planning   Eat a balanced diet that includes:  ? 5 or more servings of fruits and vegetables each day. At each meal, try to fill half of your plate with fruits and vegetables.  ? Up to 6-8 servings of whole grains each day.  ? Less than 6 oz of lean meat, poultry, or fish each day. A 3-oz serving of meat is about the same size as a deck of cards. One egg equals 1 oz.  ? 2 servings of low-fat dairy each day.  ? A serving of nuts, seeds, or beans 5 times each week.  ? Heart-healthy fats. Healthy fats called Omega-3 fatty acids are found in foods such as flaxseeds and coldwater fish, like sardines, salmon, and mackerel.   Limit how much you eat of the following:  ? Canned or prepackaged foods.  ? Food that is high in trans fat, such as fried foods.  ? Food that is high in saturated fat, such as fatty meat.  ? Sweets, desserts, sugary drinks, and other foods with added sugar.  ? Full-fat dairy products.   Do not salt foods before eating.   Try to eat at least 2 vegetarian meals each week.   Eat more home-cooked food and less restaurant, buffet, and fast food.     When eating at a restaurant, ask that your food be prepared with less salt or no salt, if possible.  What foods are recommended?  The items listed may not be a complete list. Talk with your dietitian about what dietary choices are best for you.  Grains  Whole-grain or whole-wheat bread. Whole-grain or whole-wheat pasta. Brown rice. Oatmeal. Quinoa. Bulgur. Whole-grain and low-sodium cereals. Pita bread. Low-fat, low-sodium crackers. Whole-wheat flour tortillas.  Vegetables  Fresh or frozen vegetables (raw, steamed, roasted, or grilled). Low-sodium or reduced-sodium tomato and vegetable juice. Low-sodium or reduced-sodium tomato sauce and tomato paste. Low-sodium or reduced-sodium canned vegetables.  Fruits  All fresh, dried, or frozen fruit. Canned fruit in natural juice (without  added sugar).  Meat and other protein foods  Skinless chicken or turkey. Ground chicken or turkey. Pork with fat trimmed off. Fish and seafood. Egg whites. Dried beans, peas, or lentils. Unsalted nuts, nut butters, and seeds. Unsalted canned beans. Lean cuts of beef with fat trimmed off. Low-sodium, lean deli meat.  Dairy  Low-fat (1%) or fat-free (skim) milk. Fat-free, low-fat, or reduced-fat cheeses. Nonfat, low-sodium ricotta or cottage cheese. Low-fat or nonfat yogurt. Low-fat, low-sodium cheese.  Fats and oils  Soft margarine without trans fats. Vegetable oil. Low-fat, reduced-fat, or light mayonnaise and salad dressings (reduced-sodium). Canola, safflower, olive, soybean, and sunflower oils. Avocado.  Seasoning and other foods  Herbs. Spices. Seasoning mixes without salt. Unsalted popcorn and pretzels. Fat-free sweets.  What foods are not recommended?  The items listed may not be a complete list. Talk with your dietitian about what dietary choices are best for you.  Grains  Baked goods made with fat, such as croissants, muffins, or some breads. Dry pasta or rice meal packs.  Vegetables  Creamed or fried vegetables. Vegetables in a cheese sauce. Regular canned vegetables (not low-sodium or reduced-sodium). Regular canned tomato sauce and paste (not low-sodium or reduced-sodium). Regular tomato and vegetable juice (not low-sodium or reduced-sodium). Pickles. Olives.  Fruits  Canned fruit in a light or heavy syrup. Fried fruit. Fruit in cream or butter sauce.  Meat and other protein foods  Fatty cuts of meat. Ribs. Fried meat. Bacon. Sausage. Bologna and other processed lunch meats. Salami. Fatback. Hotdogs. Bratwurst. Salted nuts and seeds. Canned beans with added salt. Canned or smoked fish. Whole eggs or egg yolks. Chicken or turkey with skin.  Dairy  Whole or 2% milk, cream, and half-and-half. Whole or full-fat cream cheese. Whole-fat or sweetened yogurt. Full-fat cheese. Nondairy creamers. Whipped toppings.  Processed cheese and cheese spreads.  Fats and oils  Butter. Stick margarine. Lard. Shortening. Ghee. Bacon fat. Tropical oils, such as coconut, palm kernel, or palm oil.  Seasoning and other foods  Salted popcorn and pretzels. Onion salt, garlic salt, seasoned salt, table salt, and sea salt. Worcestershire sauce. Tartar sauce. Barbecue sauce. Teriyaki sauce. Soy sauce, including reduced-sodium. Steak sauce. Canned and packaged gravies. Fish sauce. Oyster sauce. Cocktail sauce. Horseradish that you find on the shelf. Ketchup. Mustard. Meat flavorings and tenderizers. Bouillon cubes. Hot sauce and Tabasco sauce. Premade or packaged marinades. Premade or packaged taco seasonings. Relishes. Regular salad dressings.  Where to find more information:   National Heart, Lung, and Blood Institute: www.nhlbi.nih.gov   American Heart Association: www.heart.org  Summary   The DASH eating plan is a healthy eating plan that has been shown to reduce high blood pressure (hypertension). It may also reduce your risk for type 2 diabetes, heart disease, and stroke.   With the   DASH eating plan, you should limit salt (sodium) intake to 2,300 mg a day. If you have hypertension, you may need to reduce your sodium intake to 1,500 mg a day.   When on the DASH eating plan, aim to eat more fresh fruits and vegetables, whole grains, lean proteins, low-fat dairy, and heart-healthy fats.   Work with your health care provider or diet and nutrition specialist (dietitian) to adjust your eating plan to your individual calorie needs.  This information is not intended to replace advice given to you by your health care provider. Make sure you discuss any questions you have with your health care provider.  Document Released: 12/03/2011 Document Revised: 12/07/2016 Document Reviewed: 12/07/2016  Elsevier Interactive Patient Education  2019 [employer]

## 2019-06-21 NOTE — Assessment & Plan Note (Signed)
-  BP remains elevated today, will add chlorthalidone 12.5mg  daily and have her f/u with Dr. Ethelene Hal in about 1 week for BP check and updated BMP.   -Reviewed DASH diet with her and her niece who primarily prepares her meals.  -Reviewed red flag symptoms and when to call 911 -Murmur noted on exam today as well, unsure if noted previously however echo from last year reviewed showing mild aortic sclerosis.

## 2019-06-21 NOTE — Progress Notes (Addendum)
[facility] Note  06/21/2019     CHIEF COMPLAINT Patient presents for Eye Pain   HISTORY OF PRESENT ILLNESS: Alejandra Davis is a 83 y.o. female who presents to the clinic today for:   HPI    Eye Pain      In right eye.  Characterized as aching.  Pain was noted as 8/10.  Occurring intermittently.  Since onset it is stable.  I, the attending physician,  performed the HPI with the patient and updated documentation appropriately.          Comments    Patient states her right eye started aching a few days ago and states the pain comes and goes.  Patient denies any pain or discomfort in the left eye.  Patient states her vision is intermittently blurry during times that her right eye is hurting but states for the most part, her vision is stable.  Patient denies any new or worsening floaters or fol OU.       Last edited by [name], MD on 06/21/2019 11:34 AM. (History)    pt states yesterday she had a headache that started above her right eye that radiated down through the back of her head, she states she is using Pred Forte and Cosopt as directed, pt states she spoke to her PCP yesterday and they referred her to Dr. Jerline Pain, but pts daughter preferred to come here instead   Lab Results  Component Value Date   HGBA1C 6.9 (H) 03/13/2019    Patient states  Referring physician: Libby Maw, MD [place],  [place] [address]  HISTORICAL INFORMATION:   Selected notes from the MEDICAL RECORD NUMBER Referred by [name] for DM exam LEE-  Ocular Hx-  PMH- HTN, thyroid disease, DM (A1C- 6.5)    CURRENT MEDICATIONS: Current Outpatient Medications (Ophthalmic Drugs)  Medication Sig  . brimonidine (ALPHAGAN) 0.2 % ophthalmic solution Place 1 drop into both eyes 2 (two) times a day.  . dorzolamide-timolol (COSOPT) 22.3-6.8 MG/ML ophthalmic solution Place 1 drop into the right eye 2 (two) times daily.  Marland Kitchen ketorolac (ACULAR)  0.5 % ophthalmic solution Place 1 drop into the right eye 4 (four) times daily.  . prednisoLONE acetate (PRED FORTE) 1 % ophthalmic suspension Place 1 drop into the right eye 4 (four) times daily.   No current facility-administered medications for this visit.  (Ophthalmic Drugs)   Current Outpatient Medications (Other)  Medication Sig  . amLODipine (NORVASC) 10 MG tablet Take 1 tablet (10 mg total) by mouth daily.  . calcium-vitamin D (OSCAL 500/200 D-3) 500-200 MG-UNIT tablet Take 1 tablet by mouth 2 (two) times daily.  Marland Kitchen donepezil (ARICEPT) 10 MG tablet Take 0.5 tablets (5 mg total) by mouth at bedtime.  Marland Kitchen levothyroxine (SYNTHROID, LEVOTHROID) 75 MCG tablet TAKE 1 TABLET(75 MCG) BY MOUTH DAILY (Patient taking differently: Take 75 mcg by mouth daily before breakfast. )  . metFORMIN (GLUCOPHAGE-XR) 500 MG 24 hr tablet Take 1 tablet (500 mg total) by mouth at bedtime.  . metoprolol succinate (TOPROL-XL) 50 MG 24 hr tablet Take 1 tablet (50 mg total) by mouth daily.  Marland Kitchen triamcinolone ointment (KENALOG) 0.1 % Apply 1 application topically 2 (two) times daily. For rash on thighs and arms.   Current Facility-Administered Medications (Other)  Medication Route  . Bevacizumab (AVASTIN) SOLN 1.25 mg Intravitreal  . Bevacizumab (AVASTIN) SOLN 1.25 mg Intravitreal  . Bevacizumab (AVASTIN) SOLN 1.25 mg Intravitreal  .  Bevacizumab (AVASTIN) SOLN 1.25 mg Intravitreal      REVIEW OF SYSTEMS: ROS    Positive for: Endocrine, Cardiovascular, Eyes   Negative for: Constitutional, Gastrointestinal, Neurological, Skin, Genitourinary, Musculoskeletal, HENT, Respiratory, Psychiatric, Allergic/Imm, Heme/Lymph   Last edited by Doneen Poisson on 06/21/2019  9:53 AM. (History)       ALLERGIES No Known Allergies  PAST MEDICAL HISTORY Past Medical History:  Diagnosis Date  . Chicken pox   . Cystocele   . Diabetes mellitus without complication ([place])   . Hypertension   . Thyroid disease     hypothyroidism   Past Surgical History:  Procedure Laterality Date  . ABDOMINAL HYSTERECTOMY  1990   TAH BSO  . OOPHORECTOMY     BSO  . Vaginal Bx     Papilloma    FAMILY HISTORY Family History  Problem Relation Age of Onset  . Sickle cell anemia Other   . Hypertension Mother   . Cancer Father        LIVER  . Heart disease Brother   . Cancer Brother        STOMACH    SOCIAL HISTORY Social History   Tobacco Use  . Smoking status: Never Smoker  . Smokeless tobacco: Never Used  Substance Use Topics  . Alcohol use: No  . Drug use: No         OPHTHALMIC EXAM:  Base Eye Exam    Visual Acuity (Snellen - Linear)      Right Left   Dist cc 20/30 -2 20/25 -2   Dist ph cc NI 20/25 -1   Correction: Glasses       Tonometry (Tonopen, 9:56 AM)      Right Left   Pressure 20 23       Pupils      Dark Light Shape React APD   Right 3 2 Round Minimal 0   Left 3 2 Round Minimal 0       Extraocular Movement      Right Left    Full Full       Neuro/Psych    Oriented x3: Yes   Mood/Affect: Normal       Dilation    Both eyes: 1.0% Mydriacyl, 2.5% Phenylephrine @ 9:56 AM        Slit Lamp and Fundus Exam    Slit Lamp Exam      Right Left   Lids/Lashes Dermatochalasis - upper lid, Meibomian gland dysfunction Dermatochalasis - upper lid, Meibomian gland dysfunction   Conjunctiva/Sclera Melanosis, no injection Melanosis   Cornea Arcus, 1+ Punctate epithelial erosions, mild Anterior basement membrane dystrophy, Well healed cataract wounds, tr Descemet's folds tempoally Arcus, Inferior 1+ Punctate epithelial erosions; 2-3+ microcystic edema temporal with 2+ descemet folds, Keratic precipitates.  Well healed temporal cat sx incision.   Anterior Chamber Deep, no cell or flare Deep;  no cell or flare   Iris Round and dilated, No NVI Round and dilated, No NVI   Lens PC IOL in good position, trace Posterior capsular opacification Posterior chamber intraocular lens in good  position.  1+ PCO.   Vitreous Vitreous syneresis Vitreous syneresis       Fundus Exam      Right Left   Disc Sharp rim, inferior notch, 1+ pallor, inf. Rim thinning, mild disc heme at 10:30, attenuated vessels superiorly, superior hyperemia trace pallor   C/D Ratio 0.7 0.6   Macula Flat, blunted foveal reflex, DBH superior macula good foveal reflex, flat,  RPE mottling, Epiretinal membrane, No heme or edema   Vessels Severe vascular attenuation superiorly, BRVO Mild vascular attenuation, AV crossing changes   Periphery Attached, superior DBH Attached, scattered Reticular degeneration        Refraction    Wearing Rx      Sphere Cylinder Axis Add   Right -0.75 +1.25 170 +2.50   Left -0.75 +1.00 003 +2.50   Type: PAL          IMAGING AND PROCEDURES  Imaging and Procedures for 03/07/18  OCT, Retina - OU - Both Eyes       Right Eye Quality was good. Central Foveal Thickness: 261. Progression has improved. Findings include no SRF, epiretinal membrane, normal foveal contour, no IRF (Blunted foveal reflex; Interval resolution of IRF).   Left Eye Quality was good. Central Foveal Thickness: 266. Progression has been stable. Findings include abnormal foveal contour, epiretinal membrane, no IRF, no SRF, vitreomacular adhesion  (Blunted foveal reflex).   Notes *Images captured and stored on drive  Diagnosis / Impression:  ERM OU OD: interval resolution of IRF/CME OS: very mild ERM with blunted foveal reflex -- stable; mild VMA  Clinical management:  See below  Abbreviations: NFP - Normal foveal profile. CME - cystoid macular edema. PED - pigment epithelial detachment. IRF - intraretinal fluid. SRF - subretinal fluid. EZ - ellipsoid zone. ERM - epiretinal membrane. ORA - outer retinal atrophy. ORT - outer retinal tubulation. SRHM - subretinal hyper-reflective material                  ASSESSMENT/PLAN:    ICD-10-CM   1. Branch retinal vein occlusion of right eye with  macular edema  H34.8310   2. Diabetes mellitus type 2 without retinopathy ([place])  E11.9   3. Epiretinal membrane (ERM) of both eyes  H35.373   4. Retinal edema  H35.81 OCT, Retina - OU - Both Eyes  5. Pseudophakia of both eyes  Z96.1   6. Ocular hypertension of right eye  H40.051   7. Glaucoma suspect of right eye  H40.001   8. Essential hypertension  I10   9. Hypertensive retinopathy of both eyes  H35.033   10. Headache above the eye region  R51     1. BRVO with CME OD  - at presentation, BCVA 20/60 OD -- down from 20/30  - initial OCT w/ CME superior macula  - s/p IVA OD #1 (10.18.19), #2 (11.19.19), #3 (12.17.19), #4 (02.19.20), #5 (03.23.20), #6 (04.21.20), #7 (05.27.20)  - good response to medications, but held IVA in January due to recent cataract surgery  - today, BCVA 20/30 OD, OCT w/ interval resolution of IRF/cystic changes  - F/U as scheduled on July 7 -- DFE/OCT/possible injection  2. Diabetes mellitus, type 2 without retinopathy  - The incidence, risk factors for progression, natural history and treatment options for diabetic retinopathy  were discussed with patient.    - The need for close monitoring of blood glucose, blood pressure, and serum lipids, avoiding cigarette or any type of tobacco, and the need for long term follow up was also discussed with patient.  3,4. Epiretinal membrane, OU  - relatively mild ERM OU -- blunted central foveal depression  - OD with mild cystic changes 2/2 BRVO as above; OS without cystic changes or edema  - discussed findings and prognosis  - recommend monitoring for now  5. Pseudophakia OU  - s/p CE/IOL OS 11.13.19 w/ Dr. [name]  - s/p CE/IOL OD 01.16.20  w/ Dr. [name]  - beautiful surgeries -- IOLs in perfect position  - healing well post-operatively  - IRF/CME OD may have been partly due to post op CME ([place]) in addition to BRVO  - cont PF taper -- 1 drop daily for 2 weeks, then stop  6,7. Ocular hypertension / Glaucoma  suspect OD>OS-   - IOP ~25 OD, 20 OS at both prior clinic visits  - today IOP 23 OS, OD is 20 -- on cosopt BID OU  - denies any family hx of glaucoma  - under the expert care of Dr. [name] who is actively managing  - cont Cosopt BID OU  - add brimonidine BID OU  8-10. Hypertensive retinopathy OU w/ symptomatic headache  - pt presents urgently for right sided headache above right eye with extension to occiput  - discussed importance of tight BP control  - BP in office today, 201/100, left arm  - suspect pt's headache is related to elevated BP  - recommend immediate evaluation with PCP  - pt called and set up appt with Dr. Bebe Shaggy office for immediate evaluation this afternoon   Ophthalmic Meds Ordered this visit:  Meds ordered this encounter  Medications  . brimonidine (ALPHAGAN) 0.2 % ophthalmic solution    Sig: Place 1 drop into both eyes 2 (two) times a day.    Dispense:  10 mL    Refill:  3       Return for f/u July 7 as scheduled.  There are no Patient Instructions on file for this visit.   Explained the diagnoses, plan, and follow up with the patient and they expressed understanding.  Patient expressed understanding of the importance of proper follow up care.   This document serves as a record of services personally performed by Gardiner Sleeper, MD, PhD. It was created on their behalf by Ernest Mallick, OA, an ophthalmic assistant. The creation of this record is the provider's dictation and/or activities during the visit.    Electronically signed by: Ernest Mallick, OA  06.24.2020 12:30 PM      Gardiner Sleeper, M.D., Ph.D. Diseases & Surgery of the Retina and Vitreous [facility]  I have reviewed the above documentation for accuracy and completeness, and I agree with the above. Gardiner Sleeper, M.D., Ph.D. 06/21/19 12:30 PM    Abbreviations: M myopia (nearsighted); A astigmatism; H hyperopia (farsighted); P presbyopia; Mrx spectacle  prescription;  CTL contact lenses; OD right eye; OS left eye; OU both eyes  XT exotropia; ET esotropia; PEK punctate epithelial keratitis; PEE punctate epithelial erosions; DES dry eye syndrome; MGD meibomian gland dysfunction; ATs artificial tears; PFAT's preservative free artificial tears; [place] nuclear sclerotic cataract; PSC posterior subcapsular cataract; ERM epi-retinal membrane; PVD posterior vitreous detachment; RD retinal detachment; DM diabetes mellitus; DR diabetic retinopathy; NPDR non-proliferative diabetic retinopathy; PDR proliferative diabetic retinopathy; CSME clinically significant macular edema; DME diabetic macular edema; dbh dot blot hemorrhages; CWS cotton wool spot; POAG primary open angle glaucoma; C/D cup-to-disc ratio; HVF humphrey visual field; GVF goldmann visual field; OCT optical coherence tomography; IOP intraocular pressure; BRVO Branch retinal vein occlusion; CRVO central retinal vein occlusion; CRAO central retinal artery occlusion; BRAO branch retinal artery occlusion; RT retinal tear; SB scleral buckle; PPV pars plana vitrectomy; VH Vitreous hemorrhage; PRP panretinal laser photocoagulation; IVK intravitreal kenalog; VMT vitreomacular traction; MH Macular hole;  NVD neovascularization of the disc; NVE neovascularization elsewhere; AREDS age related eye disease study; ARMD age related macular degeneration;  POAG primary open angle glaucoma; EBMD epithelial/anterior basement membrane dystrophy; ACIOL anterior chamber intraocular lens; IOL intraocular lens; PCIOL posterior chamber intraocular lens; Phaco/IOL phacoemulsification with intraocular lens placement; [place] photorefractive keratectomy; LASIK laser assisted in situ keratomileusis; HTN hypertension; DM diabetes mellitus; COPD chronic obstructive pulmonary disease

## 2019-06-21 NOTE — Progress Notes (Signed)
Alejandra Davis - 83 y.o. female MRN [id number]  Date of birth: [birth year]  Subjective Chief Complaint  Patient presents with  . Hypertension    Eye Dr appt BP 200/100,     HPI Alejandra Davis is a 83 y.o. female being seen today for hypertension.  She is accompanied by her niece today.  Reports having headache for several days.  Seen via virtual visit with Dr. Ethelene Hal on 6/22 and thought to be eye related.  Seen by ophthalmologist today with BP of 200/100 and continued headache.  Thought to be related to hypertension by ophthalmologist she was referred back to [place] for urgent visit this afternoon.  She reports she is feeling a better now, headache improved compared to a couple of days ago.  Pain located just above R eye.  Denies temporal tenderness or jaw claudication symptoms.  She has not had any other symptoms related to hypertension including chest pain, shortness of breath, edema, weakness/numbness/tingling, slurred speech or difficulty swallowing.   ROS:  A comprehensive ROS was completed and negative except as noted per HPI  No Known Allergies  Past Medical History:  Diagnosis Date  . Chicken pox   . Cystocele   . Diabetes mellitus without complication ([place])   . Hypertension   . Thyroid disease    hypothyroidism    Past Surgical History:  Procedure Laterality Date  . ABDOMINAL HYSTERECTOMY  1990   TAH BSO  . OOPHORECTOMY     BSO  . Vaginal Bx     Papilloma    Social History   Socioeconomic History  . Marital status: Divorced    Spouse name: Not on file  . Number of children: Not on file  . Years of education: Not on file  . Highest education level: Not on file  Occupational History  . Not on file  Social Needs  . Financial resource strain: Not on file  . Food insecurity    Worry: Not on file    Inability: Not on file  . Transportation needs    Medical: Not on file    Non-medical: Not on file  Tobacco Use  . Smoking status: Never Smoker  . Smokeless tobacco:  Never Used  Substance and Sexual Activity  . Alcohol use: No  . Drug use: No  . Sexual activity: Not on file  Lifestyle  . Physical activity    Days per week: Not on file    Minutes per session: Not on file  . Stress: Not on file  Relationships  . Social Herbalist on phone: Not on file    Gets together: Not on file    Attends religious service: Not on file    Active member of club or organization: Not on file    Attends meetings of clubs or organizations: Not on file    Relationship status: Not on file  Other Topics Concern  . Not on file  Social History Narrative  . Not on file    Family History  Problem Relation Age of Onset  . Sickle cell anemia Other   . Hypertension Mother   . Cancer Father        LIVER  . Heart disease Brother   . Cancer Brother        STOMACH    Health Maintenance  Topic Date Due  . FOOT EXAM  10/22/1948  . OPHTHALMOLOGY EXAM  10/22/1948  . TETANUS/TDAP  10/22/1957  . PNA vac Low Risk  Adult (1 of 2 - PCV13) 10/23/2003  . INFLUENZA VACCINE  07/29/2019  . URINE MICROALBUMIN  09/07/2019  . HEMOGLOBIN A1C  09/13/2019  . DEXA SCAN  Completed    ----------------------------------------------------------------------------------------------------------------------------------------------------------------------------------------------------------------- Physical Exam BP (!) 174/88 (BP Location: Left Arm, Patient Position: Sitting, Cuff Size: Normal)   Pulse 64   Temp 97.7 F (36.5 C) (Oral)   Resp 16   Ht 5\' 2"  (1.575 m)   Wt 122 lb 9.6 oz (55.6 kg)   SpO2 97%   BMI 22.42 kg/m   Physical Exam Constitutional:      Appearance: Normal appearance.  HENT:     Head: Normocephalic and atraumatic.     Mouth/Throat:     Mouth: Mucous membranes are moist.  Eyes:     General: No scleral icterus. Neck:     Musculoskeletal: Neck supple.  Cardiovascular:     Rate and Rhythm: Normal rate and regular rhythm.     Heart sounds: Murmur  (2-3/6 SEM) present.  Pulmonary:     Effort: Pulmonary effort is normal.     Breath sounds: Normal breath sounds.  Skin:    General: Skin is warm and dry.     Findings: No rash.  Neurological:     General: No focal deficit present.     Mental Status: She is alert.     Cranial Nerves: No cranial nerve deficit.     Sensory: No sensory deficit.     Gait: Gait normal.  Psychiatric:        Mood and Affect: Mood normal.        Behavior: Behavior normal.     ------------------------------------------------------------------------------------------------------------------------------------------------------------------------------------------------------------------- Assessment and Plan  Hypertension -BP remains elevated today, will add chlorthalidone 12.5mg  daily and have her f/u with Dr. Ethelene Hal in about 1 week for BP check and updated BMP.   -Reviewed DASH diet with her and her niece who primarily prepares her meals.  -Reviewed red flag symptoms and when to call 911 -Murmur noted on exam today as well, unsure if noted previously however echo from last year reviewed showing mild aortic sclerosis.

## 2019-06-26 ENCOUNTER — Telehealth: Payer: Self-pay | Admitting: Behavioral Health

## 2019-06-26 NOTE — Telephone Encounter (Signed)

## 2019-06-27 ENCOUNTER — Encounter: Payer: Self-pay | Admitting: Family Medicine

## 2019-06-27 ENCOUNTER — Ambulatory Visit (INDEPENDENT_AMBULATORY_CARE_PROVIDER_SITE_OTHER): Payer: Medicare Other | Admitting: Family Medicine

## 2019-06-27 VITALS — BP 128/80 | HR 73 | Ht 62.0 in | Wt 120.1 lb

## 2019-06-27 DIAGNOSIS — I10 Essential (primary) hypertension: Secondary | ICD-10-CM | POA: Diagnosis not present

## 2019-06-27 NOTE — Progress Notes (Signed)
Established Patient Office Visit  Subjective:  Patient ID: Alejandra Davis, female    DOB: [birth year]  Age: 83 y.o. MRN: [id number]  CC:  Chief Complaint  Patient presents with  . Follow-up    HPI Alejandra Davis presents for follow-up of her hypertension.  Blood pressures under better control.  Her niece is with her.  She is actually staying with her niece who is ensuring that patient has been taking her medicine.  They are using a pillbox.  Assures compliance with amlodipine 10 mg, metoprolol succinate 50 mg and 12.5 chlorthalidone.  Blood pressure with home cuff recently purchased runs systolic [id number].  Recently started Aricept.  Past Medical History:  Diagnosis Date  . Chicken pox   . Cystocele   . Diabetes mellitus without complication ([place])   . Hypertension   . Thyroid disease    hypothyroidism    Past Surgical History:  Procedure Laterality Date  . ABDOMINAL HYSTERECTOMY  1990   TAH BSO  . OOPHORECTOMY     BSO  . Vaginal Bx     Papilloma    Family History  Problem Relation Age of Onset  . Sickle cell anemia Other   . Hypertension Mother   . Cancer Father        LIVER  . Heart disease Brother   . Cancer Brother        STOMACH    Social History   Socioeconomic History  . Marital status: Divorced    Spouse name: Not on file  . Number of children: Not on file  . Years of education: Not on file  . Highest education level: Not on file  Occupational History  . Not on file  Social Needs  . Financial resource strain: Not on file  . Food insecurity    Worry: Not on file    Inability: Not on file  . Transportation needs    Medical: Not on file    Non-medical: Not on file  Tobacco Use  . Smoking status: Never Smoker  . Smokeless tobacco: Never Used  Substance and Sexual Activity  . Alcohol use: No  . Drug use: No  . Sexual activity: Not on file  Lifestyle  . Physical activity    Days per week: Not on file    Minutes per session: Not on file   . Stress: Not on file  Relationships  . Social Herbalist on phone: Not on file    Gets together: Not on file    Attends religious service: Not on file    Active member of club or organization: Not on file    Attends meetings of clubs or organizations: Not on file    Relationship status: Not on file  . Intimate partner violence    Fear of current or ex partner: Not on file    Emotionally abused: Not on file    Physically abused: Not on file    Forced sexual activity: Not on file  Other Topics Concern  . Not on file  Social History Narrative  . Not on file    Outpatient Medications Prior to Visit  Medication Sig Dispense Refill  . amLODipine (NORVASC) 10 MG tablet Take 1 tablet (10 mg total) by mouth daily. 90 tablet 1  . brimonidine (ALPHAGAN) 0.2 % ophthalmic solution Place 1 drop into both eyes 2 (two) times a day. 10 mL 3  . calcium-vitamin D (OSCAL 500/200 D-3) 500-200 MG-UNIT tablet Take 1  tablet by mouth 2 (two) times daily. 180 tablet 1  . chlorthalidone (HYGROTON) 25 MG tablet TAKE 1/2 TABLET(12.5 MG) BY MOUTH DAILY 45 tablet 0  . donepezil (ARICEPT) 10 MG tablet Take 0.5 tablets (5 mg total) by mouth at bedtime. 30 tablet 3  . dorzolamide-timolol (COSOPT) 22.3-6.8 MG/ML ophthalmic solution Place 1 drop into the right eye 2 (two) times daily. 10 mL 4  . ketorolac (ACULAR) 0.5 % ophthalmic solution Place 1 drop into the right eye 4 (four) times daily. 10 mL 2  . levothyroxine (SYNTHROID, LEVOTHROID) 75 MCG tablet TAKE 1 TABLET(75 MCG) BY MOUTH DAILY (Patient taking differently: Take 75 mcg by mouth daily before breakfast. ) 90 tablet 1  . metFORMIN (GLUCOPHAGE-XR) 500 MG 24 hr tablet Take 1 tablet (500 mg total) by mouth at bedtime. 90 tablet 1  . metoprolol succinate (TOPROL-XL) 50 MG 24 hr tablet Take 1 tablet (50 mg total) by mouth daily. 90 tablet 1  . prednisoLONE acetate (PRED FORTE) 1 % ophthalmic suspension Place 1 drop into the right eye 4 (four) times  daily. 10 mL 2  . triamcinolone ointment (KENALOG) 0.1 % Apply 1 application topically 2 (two) times daily. For rash on thighs and arms. 30 g 0   Facility-Administered Medications Prior to Visit  Medication Dose Route Frequency Provider Last Rate Last Dose  . Bevacizumab (AVASTIN) SOLN 1.25 mg  1.25 mg Intravitreal  [name], MD   1.25 mg at 10/16/18 2335  . Bevacizumab (AVASTIN) SOLN 1.25 mg  1.25 mg Intravitreal  [name], MD   1.25 mg at 11/15/18 0939  . Bevacizumab (AVASTIN) SOLN 1.25 mg  1.25 mg Intravitreal  [name], MD   1.25 mg at 12/13/18 1542  . Bevacizumab (AVASTIN) SOLN 1.25 mg  1.25 mg Intravitreal  [name], MD   1.25 mg at 02/15/19 1725    No Known Allergies  ROS Review of Systems    Objective:    Physical Exam  Constitutional: She is oriented to person, place, and time. She appears well-developed and well-nourished. No distress.  HENT:  Head: Normocephalic and atraumatic.  Right Ear: External ear normal.  Left Ear: External ear normal.  Eyes: Conjunctivae are normal. Right eye exhibits no discharge. Left eye exhibits no discharge. No scleral icterus.  Neck: No JVD present. No tracheal deviation present.  Cardiovascular: Normal rate, regular rhythm and normal heart sounds.  Pulmonary/Chest: Effort normal and breath sounds normal. No stridor.  Neurological: She is alert and oriented to person, place, and time.  Skin: Skin is warm and dry. She is not diaphoretic.  Psychiatric: She has a normal mood and affect. Her behavior is normal.    BP 128/80   Pulse 73   Ht 5\' 2"  (1.575 m)   Wt 120 lb 2 oz (54.5 kg)   SpO2 99%   BMI 21.97 kg/m  Wt Readings from Last 3 Encounters:  06/27/19 120 lb 2 oz (54.5 kg)  06/21/19 122 lb 9.6 oz (55.6 kg)  05/11/19 129 lb 4 oz (58.6 kg)   BP Readings from Last 3 Encounters:  06/27/19 128/80  06/21/19 (!) 174/88  06/21/19 (!) 201/100   Guideline developer:  UpToDate (see UpToDate for funding source) Date  Released: June 2014  Health Maintenance Due  Topic Date Due  . FOOT EXAM  10/22/1948  . OPHTHALMOLOGY EXAM  10/22/1948  . TETANUS/TDAP  10/22/1957  . PNA vac Low Risk Adult (1 of 2 - PCV13) 10/23/2003    There  are no preventive care reminders to display for this patient.  Lab Results  Component Value Date   TSH 1.32 03/13/2019   Lab Results  Component Value Date   WBC 5.7 05/01/2019   HGB 13.6 05/01/2019   HCT 42.2 05/01/2019   MCV 82.9 05/01/2019   PLT 271 05/01/2019   Lab Results  Component Value Date   NA 140 05/01/2019   K 3.8 05/01/2019   CO2 25 05/01/2019   GLUCOSE 115 (H) 05/01/2019   BUN 14 05/01/2019   CREATININE 1.10 (H) 05/01/2019   BILITOT 0.6 05/01/2019   ALKPHOS 69 05/01/2019   AST 21 05/01/2019   ALT 17 05/01/2019   PROT 7.8 05/01/2019   ALBUMIN 4.0 05/01/2019   CALCIUM 9.9 05/01/2019   ANIONGAP 11 05/01/2019   GFR 53.27 (L) 03/13/2019   Lab Results  Component Value Date   CHOL 180 09/06/2018   Lab Results  Component Value Date   HDL 74.60 09/06/2018   Lab Results  Component Value Date   LDLCALC 90 09/06/2018   Lab Results  Component Value Date   TRIG 76.0 09/06/2018   Lab Results  Component Value Date   CHOLHDL 2 09/06/2018   Lab Results  Component Value Date   HGBA1C 6.9 (H) 03/13/2019      Assessment & Plan:   Problem List Items Addressed This Visit      Cardiovascular and Mediastinum   Hypertension - Primary     Patient will continue her 3 drug regimen.  Return in 2 weeks with her blood pressure cuff to compare with our wall unit. No orders of the defined types were placed in this encounter.   Follow-up: Return in about 2 weeks (around 07/11/2019).

## 2019-07-03 NOTE — Progress Notes (Signed)
[facility] Note  07/04/2019     CHIEF COMPLAINT Patient presents for Retina Evaluation   HISTORY OF PRESENT ILLNESS: Alejandra Davis is a 83 y.o. female who presents to the clinic today for:   HPI    Retina Evaluation    In right eye.  This started weeks ago.  Duration of weeks.  Context:  distance vision.  I, the attending physician,  performed the HPI with the patient and updated documentation appropriately.          Comments    Patient states her vision is stable in both eyes.  Patient complains of the feeling of swelling in the right eye, along with redness OD.  Patient denies any new or worsening floaters or fol OU.       Last edited by [name], MD on 07/04/2019  4:04 PM. (History)    pts niece states that they have had 2 appts with pts PCP, and go back next week to have her blood pressure cuff checked, pt states her headache has improved with the improvement of her blood pressure, she states she had a headache yesterday, but it went away on it's own, she states her right eye seems blurry and like it "sags" and won't open. Niece reports that she is now living with pt to assist with medications and health care.   Lab Results  Component Value Date   HGBA1C 6.9 (H) 03/13/2019    Referring physician: Libby Maw, MD [place],  [place] [id number]  HISTORICAL INFORMATION:   Selected notes from the MEDICAL RECORD NUMBER Referred by [name] for DM exam LEE-  Ocular Hx-  PMH- HTN, thyroid disease, DM (A1C- 6.5)    CURRENT MEDICATIONS: Current Outpatient Medications (Ophthalmic Drugs)  Medication Sig  . brimonidine (ALPHAGAN) 0.2 % ophthalmic solution Place 1 drop into both eyes 2 (two) times a day.  . dorzolamide-timolol (COSOPT) 22.3-6.8 MG/ML ophthalmic solution Place 1 drop into the right eye 2 (two) times daily.  Marland Kitchen ketorolac (ACULAR) 0.5 % ophthalmic solution Place 1 drop into the right eye 4 (four)  times daily.  . prednisoLONE acetate (PRED FORTE) 1 % ophthalmic suspension Place 1 drop into the right eye 4 (four) times daily.   No current facility-administered medications for this visit.  (Ophthalmic Drugs)   Current Outpatient Medications (Other)  Medication Sig  . amLODipine (NORVASC) 10 MG tablet Take 1 tablet (10 mg total) by mouth daily.  . calcium-vitamin D (OSCAL 500/200 D-3) 500-200 MG-UNIT tablet Take 1 tablet by mouth 2 (two) times daily.  . chlorthalidone (HYGROTON) 25 MG tablet TAKE 1/2 TABLET(12.5 MG) BY MOUTH DAILY  . donepezil (ARICEPT) 10 MG tablet Take 0.5 tablets (5 mg total) by mouth at bedtime.  Marland Kitchen levothyroxine (SYNTHROID, LEVOTHROID) 75 MCG tablet TAKE 1 TABLET(75 MCG) BY MOUTH DAILY (Patient taking differently: Take 75 mcg by mouth daily before breakfast. )  . metFORMIN (GLUCOPHAGE-XR) 500 MG 24 hr tablet Take 1 tablet (500 mg total) by mouth at bedtime.  . metoprolol succinate (TOPROL-XL) 50 MG 24 hr tablet Take 1 tablet (50 mg total) by mouth daily.  Marland Kitchen triamcinolone ointment (KENALOG) 0.1 % Apply 1 application topically 2 (two) times daily. For rash on thighs and arms.   Current Facility-Administered Medications (Other)  Medication Route  . Bevacizumab (AVASTIN) SOLN 1.25 mg Intravitreal  . Bevacizumab (AVASTIN) SOLN 1.25 mg Intravitreal  . Bevacizumab (AVASTIN) SOLN 1.25 mg Intravitreal  .  Bevacizumab (AVASTIN) SOLN 1.25 mg Intravitreal      REVIEW OF SYSTEMS: ROS    Positive for: Endocrine, Cardiovascular, Eyes   Negative for: Constitutional, Gastrointestinal, Neurological, Skin, Genitourinary, Musculoskeletal, HENT, Respiratory, Psychiatric, Allergic/Imm, Heme/Lymph   Last edited by Doneen Poisson on 07/04/2019  2:54 PM. (History)       ALLERGIES No Known Allergies  PAST MEDICAL HISTORY Past Medical History:  Diagnosis Date  . Chicken pox   . Cystocele   . Diabetes mellitus without complication ([place])   . Hypertension   . Thyroid disease     hypothyroidism   Past Surgical History:  Procedure Laterality Date  . ABDOMINAL HYSTERECTOMY  1990   TAH BSO  . OOPHORECTOMY     BSO  . Vaginal Bx     Papilloma    FAMILY HISTORY Family History  Problem Relation Age of Onset  . Sickle cell anemia Other   . Hypertension Mother   . Cancer Father        LIVER  . Heart disease Brother   . Cancer Brother        STOMACH    SOCIAL HISTORY Social History   Tobacco Use  . Smoking status: Never Smoker  . Smokeless tobacco: Never Used  Substance Use Topics  . Alcohol use: No  . Drug use: No         OPHTHALMIC EXAM:  Base Eye Exam    Visual Acuity (Snellen - Linear)      Right Left   Dist cc 20/25 20/25 -3   Dist ph cc NI 20/25 +1   Correction: Glasses       Tonometry (Tonopen, 2:57 PM)      Right Left   Pressure 11 14       Extraocular Movement      Right Left    Full Full       Neuro/Psych    Oriented x3: Yes   Mood/Affect: Normal       Dilation    Both eyes: 1.0% Mydriacyl, 2.5% Phenylephrine @ 2:57 PM        Slit Lamp and Fundus Exam    Slit Lamp Exam      Right Left   Lids/Lashes Dermatochalasis - upper lid, Meibomian gland dysfunction Dermatochalasis - upper lid, Meibomian gland dysfunction   Conjunctiva/Sclera Melanosis, no injection Melanosis   Cornea Arcus, 1+ Punctate epithelial erosions, mild Anterior basement membrane dystrophy, Well healed cataract wounds, tr Descemet's folds tempoally Arcus, Inferior 1+ Punctate epithelial erosions; 2-3+ microcystic edema temporal with 2+ descemet folds, Keratic precipitates.  Well healed temporal cat sx incision.   Anterior Chamber Deep, no cell or flare Deep;  no cell or flare   Iris Round and dilated, No NVI Round and dilated, No NVI   Lens PC IOL in good position, trace Posterior capsular opacification Posterior chamber intraocular lens in good position.  1+ PCO.   Vitreous Vitreous syneresis Vitreous syneresis       Fundus Exam      Right Left    Disc Sharp rim, inferior notch, 1+ pallor, inf. Rim thinning, mild disc heme at 10:30, attenuated vessels superiorly, superior hyperemia trace pallor   C/D Ratio 0.7 0.6   Macula Flat, blunted foveal reflex, DBH temporal/superior macula good foveal reflex, flat, RPE mottling, Epiretinal membrane, No heme or edema   Vessels Severe vascular attenuation superiorly, BRVO Mild vascular attenuation, AV crossing changes   Periphery Attached, superior DBH-persistent, but improved Attached, scattered Reticular degeneration  Refraction    Wearing Rx      Sphere Cylinder Axis Add   Right -0.75 +1.25 170 +2.50   Left -0.75 +1.00 003 +2.50   Type: PAL          IMAGING AND PROCEDURES  Imaging and Procedures for 03/07/18  OCT, Retina - OU - Both Eyes       Right Eye Quality was good. Central Foveal Thickness: 262. Progression has been stable. Findings include no SRF, epiretinal membrane, normal foveal contour, no IRF (Blunted foveal reflex; stable resolution of IRF).   Left Eye Quality was good. Central Foveal Thickness: 267. Progression has been stable. Findings include abnormal foveal contour, epiretinal membrane, no IRF, no SRF, vitreomacular adhesion  (Blunted foveal reflex).   Notes *Images captured and stored on drive  Diagnosis / Impression:  ERM OU OD: stable resolution of IRF/CME OS: very mild ERM with blunted foveal reflex -- stable; mild VMA  Clinical management:  See below  Abbreviations: NFP - Normal foveal profile. CME - cystoid macular edema. PED - pigment epithelial detachment. IRF - intraretinal fluid. SRF - subretinal fluid. EZ - ellipsoid zone. ERM - epiretinal membrane. ORA - outer retinal atrophy. ORT - outer retinal tubulation. SRHM - subretinal hyper-reflective material         Intravitreal Injection, Pharmacologic Agent - OD - Right Eye       Time Out 07/04/2019. 4:10 PM. Confirmed correct patient, procedure, site, and patient consented.    Anesthesia Topical anesthesia was used. Anesthetic medications included Lidocaine 2%, Proparacaine 0.5%.   Procedure Preparation included 5% betadine to ocular surface, eyelid speculum. A supplied needle was used.   Injection:  1.25 mg Bevacizumab (AVASTIN) SOLN   NDC: 40347-425-95, Lot: [id number] , Expiration date: 09/06/2019   Route: Intravitreal, Site: Right Eye, Waste: 0 mL  Post-op Post injection exam found visual acuity of at least counting fingers. The patient tolerated the procedure well. There were no complications. The patient received written and verbal post procedure care education.                 ASSESSMENT/PLAN:    ICD-10-CM   1. Branch retinal vein occlusion of right eye with macular edema  H34.8310 Intravitreal Injection, Pharmacologic Agent - OD - Right Eye    Bevacizumab (AVASTIN) SOLN 1.25 mg  2. Diabetes mellitus type 2 without retinopathy ([place])  E11.9   3. Epiretinal membrane (ERM) of both eyes  H35.373   4. Retinal edema  H35.81 OCT, Retina - OU - Both Eyes  5. Pseudophakia of both eyes  Z96.1   6. Ocular hypertension of right eye  H40.051   7. Glaucoma suspect of right eye  H40.001   8. Essential hypertension  I10   9. Hypertensive retinopathy of both eyes  H35.033   10. Headache above the eye region  R51   11. Combined forms of age-related cataract of both eyes  H25.813     1. BRVO with CME OD  - at presentation, BCVA 20/60 OD -- down from 20/30  - initial OCT w/ CME superior macula  - s/p IVA OD #1 (10.18.19), #2 (11.19.19), #3 (12.17.19), #4 (02.19.20), #5 (03.23.20), #6 (04.21.20), #7 (05.27.20)  - good response to medications, but held IVA in January due to recent cataract surgery  - today, BCVA 20/25 OD, OCT w/ interval resolution of IRF/cystic changes  - recommend IVA OD #8 today, 07.07.20  - pt wishes to proceed  - RBA of procedure discussed, questions answered  -  informed consent obtained and signed  - see procedure note  - f/u 6  weeks -- DFE/OCT/possible injection  2. Diabetes mellitus, type 2 without retinopathy  - The incidence, risk factors for progression, natural history and treatment options for diabetic retinopathy  were discussed with patient.    - The need for close monitoring of blood glucose, blood pressure, and serum lipids, avoiding cigarette or any type of tobacco, and the need for long term follow up was also discussed with patient.  3,4. Epiretinal membrane, OU  - relatively mild ERM OU -- blunted central foveal depression  - OD with mild cystic changes 2/2 BRVO as above; OS without cystic changes or edema  - discussed findings and prognosis  - recommend monitoring for now  5. Pseudophakia OU  - s/p CE/IOL OS 11.13.19 w/ Dr. [name]  - s/p CE/IOL OD 01.16.20 w/ Dr. [name]  - beautiful surgeries -- IOLs in perfect position  - healing well post-operatively  - IRF/CME OD may have been partly due to post op CME ([place]) in addition to BRVO  - cont PF - now Qdaily  6,7. Ocular hypertension / Glaucoma suspect OD>OS  - IOP ~25 OD, 20 OS at both prior clinic visits  - today IOP 14 OS, OD is 11 -- on cosopt BID OU  - denies any family hx of glaucoma  - under the expert care of Dr. [name] who is actively managing  - cont Cosopt BID OU  - cont brimonidine BID OU  8-10. Hypertensive retinopathy OU w/ symptomatic headache  - pt presented to 6.24.2020 visit urgently for right sided headache above right eye with extension to occiput  - BP at that time was 201/100 -- pt was sent to primary care clinic for urgent evaluation and meds were adjusted  - BP now under better control and headaches improved  - discussed importance of tight BP control   Ophthalmic Meds Ordered this visit:  Meds ordered this encounter  Medications  . Bevacizumab (AVASTIN) SOLN 1.25 mg       Return in about 6 weeks (around 08/15/2019) for f/u BRVO OD, Dilated Exam, OCT, Possible Injxn.  There are no Patient Instructions  on file for this visit.   Explained the diagnoses, plan, and follow up with the patient and they expressed understanding.  Patient expressed understanding of the importance of proper follow up care.   This document serves as a record of services personally performed by Gardiner Sleeper, MD, PhD. It was created on their behalf by Ernest Mallick, OA, an ophthalmic assistant. The creation of this record is the provider's dictation and/or activities during the visit.    Electronically signed by: Ernest Mallick, OA  07.06.2020 10:23 PM     Gardiner Sleeper, M.D., Ph.D. Diseases & Surgery of the Retina and Vitreous [facility]  I have reviewed the above documentation for accuracy and completeness, and I agree with the above. Gardiner Sleeper, M.D., Ph.D. 07/04/19 10:23 PM     Abbreviations: M myopia (nearsighted); A astigmatism; H hyperopia (farsighted); P presbyopia; Mrx spectacle prescription;  CTL contact lenses; OD right eye; OS left eye; OU both eyes  XT exotropia; ET esotropia; PEK punctate epithelial keratitis; PEE punctate epithelial erosions; DES dry eye syndrome; MGD meibomian gland dysfunction; ATs artificial tears; PFAT's preservative free artificial tears; [place] nuclear sclerotic cataract; PSC posterior subcapsular cataract; ERM epi-retinal membrane; PVD posterior vitreous detachment; RD retinal detachment; DM diabetes mellitus; DR diabetic retinopathy; NPDR  non-proliferative diabetic retinopathy; PDR proliferative diabetic retinopathy; CSME clinically significant macular edema; DME diabetic macular edema; dbh dot blot hemorrhages; CWS cotton wool spot; POAG primary open angle glaucoma; C/D cup-to-disc ratio; HVF humphrey visual field; GVF goldmann visual field; OCT optical coherence tomography; IOP intraocular pressure; BRVO Branch retinal vein occlusion; CRVO central retinal vein occlusion; CRAO central retinal artery occlusion; BRAO branch retinal artery occlusion; RT  retinal tear; SB scleral buckle; PPV pars plana vitrectomy; VH Vitreous hemorrhage; PRP panretinal laser photocoagulation; IVK intravitreal kenalog; VMT vitreomacular traction; MH Macular hole;  NVD neovascularization of the disc; NVE neovascularization elsewhere; AREDS age related eye disease study; ARMD age related macular degeneration; POAG primary open angle glaucoma; EBMD epithelial/anterior basement membrane dystrophy; ACIOL anterior chamber intraocular lens; IOL intraocular lens; PCIOL posterior chamber intraocular lens; Phaco/IOL phacoemulsification with intraocular lens placement; [place] photorefractive keratectomy; LASIK laser assisted in situ keratomileusis; HTN hypertension; DM diabetes mellitus; COPD chronic obstructive pulmonary disease

## 2019-07-04 ENCOUNTER — Other Ambulatory Visit: Payer: Self-pay

## 2019-07-04 ENCOUNTER — Ambulatory Visit (INDEPENDENT_AMBULATORY_CARE_PROVIDER_SITE_OTHER): Payer: Medicare Other | Admitting: Ophthalmology

## 2019-07-04 ENCOUNTER — Encounter (INDEPENDENT_AMBULATORY_CARE_PROVIDER_SITE_OTHER): Payer: Self-pay | Admitting: Ophthalmology

## 2019-07-04 DIAGNOSIS — H35373 Puckering of macula, bilateral: Secondary | ICD-10-CM | POA: Diagnosis not present

## 2019-07-04 DIAGNOSIS — H3581 Retinal edema: Secondary | ICD-10-CM

## 2019-07-04 DIAGNOSIS — E119 Type 2 diabetes mellitus without complications: Secondary | ICD-10-CM

## 2019-07-04 DIAGNOSIS — R519 Headache, unspecified: Secondary | ICD-10-CM

## 2019-07-04 DIAGNOSIS — H34831 Tributary (branch) retinal vein occlusion, right eye, with macular edema: Secondary | ICD-10-CM | POA: Diagnosis not present

## 2019-07-04 DIAGNOSIS — R51 Headache: Secondary | ICD-10-CM

## 2019-07-04 DIAGNOSIS — H40001 Preglaucoma, unspecified, right eye: Secondary | ICD-10-CM

## 2019-07-04 DIAGNOSIS — Z961 Presence of intraocular lens: Secondary | ICD-10-CM

## 2019-07-04 DIAGNOSIS — I10 Essential (primary) hypertension: Secondary | ICD-10-CM

## 2019-07-04 DIAGNOSIS — H25813 Combined forms of age-related cataract, bilateral: Secondary | ICD-10-CM

## 2019-07-04 DIAGNOSIS — H40051 Ocular hypertension, right eye: Secondary | ICD-10-CM

## 2019-07-04 DIAGNOSIS — H35033 Hypertensive retinopathy, bilateral: Secondary | ICD-10-CM

## 2019-07-04 MED ORDER — BEVACIZUMAB CHEMO INJECTION 1.25MG/0.05ML SYRINGE FOR KALEIDOSCOPE
1.2500 mg | INTRAVITREAL | Status: AC | PRN
  Administered 2019-07-04: 1.25 mg via INTRAVITREAL

## 2019-07-13 ENCOUNTER — Telehealth: Payer: Self-pay

## 2019-07-13 NOTE — Telephone Encounter (Signed)

## 2019-07-14 ENCOUNTER — Encounter: Payer: Self-pay | Admitting: Family Medicine

## 2019-07-14 ENCOUNTER — Ambulatory Visit: Payer: Medicare Other | Admitting: Family Medicine

## 2019-07-14 VITALS — BP 120/76 | HR 73 | Ht 62.0 in | Wt 117.4 lb

## 2019-07-14 DIAGNOSIS — E119 Type 2 diabetes mellitus without complications: Secondary | ICD-10-CM | POA: Diagnosis not present

## 2019-07-14 DIAGNOSIS — I10 Essential (primary) hypertension: Secondary | ICD-10-CM

## 2019-07-14 DIAGNOSIS — R413 Other amnesia: Secondary | ICD-10-CM | POA: Diagnosis not present

## 2019-07-14 DIAGNOSIS — E039 Hypothyroidism, unspecified: Secondary | ICD-10-CM

## 2019-07-14 NOTE — Progress Notes (Signed)
Established Patient Office Visit  Subjective:  Patient ID: Alejandra Davis, female    DOB: 1936-07-07  Age: 83 y.o. MRN: [id number]  CC:  Chief Complaint  Patient presents with  . Follow-up    HPI Alejandra Davis presents for follow-up of her hypertension.  Brings in a home cuff that seems to be running a bit higher than what we get here at the clinic.  Blood pressure at home per home cuff is been running in the 1 20-1 50 range over 70- 80s.  Patient denies lightheadedness or dizziness.  She is having no problems taking the medicines.  She is currently living with her niece.  Still able to perform most of her activities of daily living to include paying bills and toileting and so forth.  Past Medical History:  Diagnosis Date  . Chicken pox   . Cystocele   . Diabetes mellitus without complication ([place])   . Hypertension   . Thyroid disease    hypothyroidism    Past Surgical History:  Procedure Laterality Date  . ABDOMINAL HYSTERECTOMY  1990   TAH BSO  . OOPHORECTOMY     BSO  . Vaginal Bx     Papilloma    Family History  Problem Relation Age of Onset  . Sickle cell anemia Other   . Hypertension Mother   . Cancer Father        LIVER  . Heart disease Brother   . Cancer Brother        STOMACH    Social History   Socioeconomic History  . Marital status: Divorced    Spouse name: Not on file  . Number of children: Not on file  . Years of education: Not on file  . Highest education level: Not on file  Occupational History  . Not on file  Social Needs  . Financial resource strain: Not on file  . Food insecurity    Worry: Not on file    Inability: Not on file  . Transportation needs    Medical: Not on file    Non-medical: Not on file  Tobacco Use  . Smoking status: Never Smoker  . Smokeless tobacco: Never Used  Substance and Sexual Activity  . Alcohol use: No  . Drug use: No  . Sexual activity: Not on file  Lifestyle  . Physical activity    Days per week:  Not on file    Minutes per session: Not on file  . Stress: Not on file  Relationships  . Social Herbalist on phone: Not on file    Gets together: Not on file    Attends religious service: Not on file    Active member of club or organization: Not on file    Attends meetings of clubs or organizations: Not on file    Relationship status: Not on file  . Intimate partner violence    Fear of current or ex partner: Not on file    Emotionally abused: Not on file    Physically abused: Not on file    Forced sexual activity: Not on file  Other Topics Concern  . Not on file  Social History Narrative  . Not on file    Outpatient Medications Prior to Visit  Medication Sig Dispense Refill  . amLODipine (NORVASC) 10 MG tablet Take 1 tablet (10 mg total) by mouth daily. 90 tablet 1  . brimonidine (ALPHAGAN) 0.2 % ophthalmic solution Place 1 drop into both  eyes 2 (two) times a day. 10 mL 3  . calcium-vitamin D (OSCAL 500/200 D-3) 500-200 MG-UNIT tablet Take 1 tablet by mouth 2 (two) times daily. 180 tablet 1  . chlorthalidone (HYGROTON) 25 MG tablet TAKE 1/2 TABLET(12.5 MG) BY MOUTH DAILY 45 tablet 0  . donepezil (ARICEPT) 10 MG tablet Take 0.5 tablets (5 mg total) by mouth at bedtime. 30 tablet 3  . dorzolamide-timolol (COSOPT) 22.3-6.8 MG/ML ophthalmic solution Place 1 drop into the right eye 2 (two) times daily. 10 mL 4  . ketorolac (ACULAR) 0.5 % ophthalmic solution Place 1 drop into the right eye 4 (four) times daily. 10 mL 2  . levothyroxine (SYNTHROID, LEVOTHROID) 75 MCG tablet TAKE 1 TABLET(75 MCG) BY MOUTH DAILY (Patient taking differently: Take 75 mcg by mouth daily before breakfast. ) 90 tablet 1  . metFORMIN (GLUCOPHAGE-XR) 500 MG 24 hr tablet Take 1 tablet (500 mg total) by mouth at bedtime. 90 tablet 1  . metoprolol succinate (TOPROL-XL) 50 MG 24 hr tablet Take 1 tablet (50 mg total) by mouth daily. 90 tablet 1  . prednisoLONE acetate (PRED FORTE) 1 % ophthalmic suspension  Place 1 drop into the right eye 4 (four) times daily. 10 mL 2  . triamcinolone ointment (KENALOG) 0.1 % Apply 1 application topically 2 (two) times daily. For rash on thighs and arms. 30 g 0   Facility-Administered Medications Prior to Visit  Medication Dose Route Frequency Provider Last Rate Last Dose  . Bevacizumab (AVASTIN) SOLN 1.25 mg  1.25 mg Intravitreal  [name], MD   1.25 mg at 10/16/18 2335  . Bevacizumab (AVASTIN) SOLN 1.25 mg  1.25 mg Intravitreal  [name], MD   1.25 mg at 11/15/18 0939  . Bevacizumab (AVASTIN) SOLN 1.25 mg  1.25 mg Intravitreal  [name], MD   1.25 mg at 12/13/18 1542  . Bevacizumab (AVASTIN) SOLN 1.25 mg  1.25 mg Intravitreal  [name], MD   1.25 mg at 02/15/19 1725    No Known Allergies  ROS Review of Systems  Constitutional: Negative.   Respiratory: Negative.   Cardiovascular: Negative.   Gastrointestinal: Negative.   Neurological: Negative for light-headedness.  Psychiatric/Behavioral: Positive for confusion. Negative for behavioral problems and hallucinations.      Objective:    Physical Exam  Constitutional: She is oriented to person, place, and time. She appears well-developed and well-nourished. No distress.  HENT:  Head: Normocephalic and atraumatic.  Right Ear: External ear normal.  Left Ear: External ear normal.  Eyes: Right eye exhibits no discharge. Left eye exhibits no discharge. No scleral icterus.  Cardiovascular: Normal rate, regular rhythm and normal heart sounds.  Pulmonary/Chest: Effort normal and breath sounds normal.  Musculoskeletal:        General: No edema.  Neurological: She is alert and oriented to person, place, and time.  Skin: Skin is warm and dry. She is not diaphoretic.  Psychiatric: She has a normal mood and affect. Her behavior is normal.    BP 120/76   Pulse 73   Ht 5\' 2"  (1.575 m)   Wt 117 lb 6 oz (53.2 kg)   SpO2 98%   BMI 21.47 kg/m  Wt Readings from Last 3 Encounters:  07/14/19  117 lb 6 oz (53.2 kg)  06/27/19 120 lb 2 oz (54.5 kg)  06/21/19 122 lb 9.6 oz (55.6 kg)   BP Readings from Last 3 Encounters:  07/14/19 120/76  06/27/19 128/80  06/21/19 (!) 174/88   Guideline developer:  UpToDate (see UpToDate for funding source) Date Released: June 2014  Health Maintenance Due  Topic Date Due  . FOOT EXAM  10/22/1948  . OPHTHALMOLOGY EXAM  10/22/1948  . TETANUS/TDAP  10/22/1957  . PNA vac Low Risk Adult (1 of 2 - PCV13) 10/23/2003  . URINE MICROALBUMIN  09/07/2019    There are no preventive care reminders to display for this patient.  Lab Results  Component Value Date   TSH 1.32 03/13/2019   Lab Results  Component Value Date   WBC 5.7 05/01/2019   HGB 13.6 05/01/2019   HCT 42.2 05/01/2019   MCV 82.9 05/01/2019   PLT 271 05/01/2019   Lab Results  Component Value Date   NA 140 05/01/2019   K 3.8 05/01/2019   CO2 25 05/01/2019   GLUCOSE 115 (H) 05/01/2019   BUN 14 05/01/2019   CREATININE 1.10 (H) 05/01/2019   BILITOT 0.6 05/01/2019   ALKPHOS 69 05/01/2019   AST 21 05/01/2019   ALT 17 05/01/2019   PROT 7.8 05/01/2019   ALBUMIN 4.0 05/01/2019   CALCIUM 9.9 05/01/2019   ANIONGAP 11 05/01/2019   GFR 53.27 (L) 03/13/2019   Lab Results  Component Value Date   CHOL 180 09/06/2018   Lab Results  Component Value Date   HDL 74.60 09/06/2018   Lab Results  Component Value Date   LDLCALC 90 09/06/2018   Lab Results  Component Value Date   TRIG 76.0 09/06/2018   Lab Results  Component Value Date   CHOLHDL 2 09/06/2018   Lab Results  Component Value Date   HGBA1C 6.9 (H) 03/13/2019      Assessment & Plan:   Problem List Items Addressed This Visit      Cardiovascular and Mediastinum   Hypertension - Primary     Endocrine   Controlled type 2 diabetes mellitus without complication, without long-term current use of insulin ([place])   Acquired hypothyroidism    Other Visit Diagnoses    Short-term memory loss          No orders  of the defined types were placed in this encounter.   Follow-up: Return in about 3 months (around 10/14/2019).   Continue all medicines as above.  Follow-up in 3 months.

## 2019-07-16 ENCOUNTER — Encounter: Payer: Self-pay | Admitting: Family Medicine

## 2019-07-17 ENCOUNTER — Other Ambulatory Visit: Payer: Self-pay

## 2019-07-17 DIAGNOSIS — Z20822 Contact with and (suspected) exposure to covid-19: Secondary | ICD-10-CM

## 2019-07-17 DIAGNOSIS — Z20828 Contact with and (suspected) exposure to other viral communicable diseases: Secondary | ICD-10-CM

## 2019-07-20 LAB — NOVEL CORONAVIRUS, NAA: SARS-CoV-2, NAA: NOT DETECTED

## 2019-08-09 ENCOUNTER — Ambulatory Visit: Payer: Medicare Other | Admitting: Neurology

## 2019-08-21 NOTE — Progress Notes (Addendum)
[facility] Note  08/22/2019     CHIEF COMPLAINT Patient presents for Retina Follow Up   HISTORY OF PRESENT ILLNESS: Alejandra Davis is a 83 y.o. female who presents to the clinic today for:   HPI    Retina Follow Up    Patient presents with  CRVO/BRVO.  In right eye.  This started 6 weeks ago.  Severity is moderate.  I, the attending physician,  performed the HPI with the patient and updated documentation appropriately.          Comments    Patient here for 6 weeks retina follow up for BRVO OD. Patient states vision seems to be doing ok. No eye pain.       Last edited by [name], MD on 08/23/2019  8:10 PM. (History)    pts is slightly delayed to f\u up from 6 weeks to 7, pts niece is with her today  Lab Results  Component Value Date   HGBA1C 6.9 (H) 03/13/2019    Referring physician: Libby Maw, MD [place],  [place] [address]  HISTORICAL INFORMATION:   Selected notes from the MEDICAL RECORD NUMBER Referred by [name] for DM exam LEE-  Ocular Hx-  PMH- HTN, thyroid disease, DM (A1C- 6.5)    CURRENT MEDICATIONS: Current Outpatient Medications (Ophthalmic Drugs)  Medication Sig  . brimonidine (ALPHAGAN) 0.2 % ophthalmic solution Place 1 drop into both eyes 2 (two) times a day.  . dorzolamide-timolol (COSOPT) 22.3-6.8 MG/ML ophthalmic solution Place 1 drop into the right eye 2 (two) times daily.  Marland Kitchen ketorolac (ACULAR) 0.5 % ophthalmic solution Place 1 drop into the right eye 4 (four) times daily.  . prednisoLONE acetate (PRED FORTE) 1 % ophthalmic suspension Place 1 drop into the right eye 4 (four) times daily.   No current facility-administered medications for this visit.  (Ophthalmic Drugs)   Current Outpatient Medications (Other)  Medication Sig  . amLODipine (NORVASC) 10 MG tablet Take 1 tablet (10 mg total) by mouth daily.  . calcium-vitamin D (OSCAL 500/200 D-3) 500-200 MG-UNIT tablet Take  1 tablet by mouth 2 (two) times daily.  . chlorthalidone (HYGROTON) 25 MG tablet TAKE 1/2 TABLET(12.5 MG) BY MOUTH DAILY  . donepezil (ARICEPT) 10 MG tablet Take 0.5 tablets (5 mg total) by mouth at bedtime.  Marland Kitchen levothyroxine (SYNTHROID, LEVOTHROID) 75 MCG tablet TAKE 1 TABLET(75 MCG) BY MOUTH DAILY (Patient taking differently: Take 75 mcg by mouth daily before breakfast. )  . metFORMIN (GLUCOPHAGE-XR) 500 MG 24 hr tablet Take 1 tablet (500 mg total) by mouth at bedtime.  . metoprolol succinate (TOPROL-XL) 50 MG 24 hr tablet Take 1 tablet (50 mg total) by mouth daily.  Marland Kitchen triamcinolone ointment (KENALOG) 0.1 % Apply 1 application topically 2 (two) times daily. For rash on thighs and arms.   Current Facility-Administered Medications (Other)  Medication Route  . Bevacizumab (AVASTIN) SOLN 1.25 mg Intravitreal  . Bevacizumab (AVASTIN) SOLN 1.25 mg Intravitreal  . Bevacizumab (AVASTIN) SOLN 1.25 mg Intravitreal  . Bevacizumab (AVASTIN) SOLN 1.25 mg Intravitreal      REVIEW OF SYSTEMS: ROS    Positive for: Endocrine, Cardiovascular, Eyes   Negative for: Constitutional, Gastrointestinal, Neurological, Skin, Genitourinary, Musculoskeletal, HENT, Respiratory, Psychiatric, Allergic/Imm, Heme/Lymph   Last edited by [name], COA on 08/22/2019 10:51 AM. (History)       ALLERGIES No Known Allergies  PAST MEDICAL HISTORY Past Medical History:  Diagnosis Date  .  Chicken pox   . Cystocele   . Diabetes mellitus without complication ([place])   . Hypertension   . Thyroid disease    hypothyroidism   Past Surgical History:  Procedure Laterality Date  . ABDOMINAL HYSTERECTOMY  1990   TAH BSO  . OOPHORECTOMY     BSO  . Vaginal Bx     Papilloma    FAMILY HISTORY Family History  Problem Relation Age of Onset  . Sickle cell anemia Other   . Hypertension Mother   . Cancer Father        LIVER  . Heart disease Brother   . Cancer Brother        STOMACH    SOCIAL HISTORY Social  History   Tobacco Use  . Smoking status: Never Smoker  . Smokeless tobacco: Never Used  Substance Use Topics  . Alcohol use: No  . Drug use: No         OPHTHALMIC EXAM:  Base Eye Exam    Visual Acuity (Snellen - Linear)      Right Left   Dist cc 20/40 -2 20/40   Dist ph cc NI 20/25 +2   Correction: Glasses       Tonometry (Tonopen, 10:48 AM)      Right Left   Pressure 10 10       Pupils      Dark Light Shape React APD   Right 3 2 Round Minimal None   Left 3 2 Round Minimal None       Visual Fields (Counting fingers)      Left Right    Full Full       Extraocular Movement      Right Left    Full, Ortho Full, Ortho       Neuro/Psych    Oriented x3: Yes   Mood/Affect: Normal       Dilation    Both eyes: 1.0% Mydriacyl, 2.5% Phenylephrine @ 10:48 AM        Slit Lamp and Fundus Exam    Slit Lamp Exam      Right Left   Lids/Lashes Dermatochalasis - upper lid, Meibomian gland dysfunction Dermatochalasis - upper lid, Meibomian gland dysfunction   Conjunctiva/Sclera Melanosis, no injection Melanosis   Cornea Arcus, 1+ Punctate epithelial erosions, mild Anterior basement membrane dystrophy, Well healed cataract wounds, tr Descemet's folds tempoally Arcus, Inferior 1+ Punctate epithelial erosions; 2-3+ microcystic edema temporal with 2+ descemet folds, Keratic precipitates.  Well healed temporal cat sx incision.   Anterior Chamber Deep, no cell or flare Deep;  no cell or flare   Iris Round and dilated, No NVI Round and dilated, No NVI   Lens PC IOL in good position, trace Posterior capsular opacification Posterior chamber intraocular lens in good position.  1+ PCO.   Vitreous Vitreous syneresis Vitreous syneresis       Fundus Exam      Right Left   Disc Sharp rim, inferior notch, 2-3+ pallor, inf. Rim thinning, mild disc heme at 11:00, attenuated vessels superiorly, superior hyperemia 1+ pallor   C/D Ratio 0.7 0.6   Macula Flat, good foveal reflex, DBH  temporal/superior macula - improved, Cystic changes good foveal reflex, flat, RPE mottling, Epiretinal membrane, No heme or edema   Vessels Severe vascular attenuation superiorly, BRVO Mild vascular attenuation, AV crossing changes   Periphery Attached, superior DBH-persistent, but improved Attached, scattered Reticular degeneration        Refraction    Wearing Rx  Sphere Cylinder Axis Add   Right -0.75 +1.25 170 +2.50   Left -0.75 +1.00 003 +2.50   Type: PAL          IMAGING AND PROCEDURES  Imaging and Procedures for 03/07/18  OCT, Retina - OU - Both Eyes       Right Eye Quality was good. Central Foveal Thickness: 281. Progression has been stable. Findings include no SRF, epiretinal membrane, normal foveal contour, intraretinal fluid (Blunted foveal depression; mild interval increase in IRF).   Left Eye Quality was good. Central Foveal Thickness: 266. Progression has been stable. Findings include abnormal foveal contour, epiretinal membrane, no IRF, no SRF, vitreomacular adhesion  (Blunted foveal depression).   Notes *Images captured and stored on drive  Diagnosis / Impression:  ERM OU OD: Blunted foveal depression; mild interval increase in IRF OS: very mild ERM with blunted foveal depression -- stable; mild VMA  Clinical management:  See below  Abbreviations: NFP - Normal foveal profile. CME - cystoid macular edema. PED - pigment epithelial detachment. IRF - intraretinal fluid. SRF - subretinal fluid. EZ - ellipsoid zone. ERM - epiretinal membrane. ORA - outer retinal atrophy. ORT - outer retinal tubulation. SRHM - subretinal hyper-reflective material         Intravitreal Injection, Pharmacologic Agent - OD - Right Eye       Time Out 08/22/2019. 10:07 AM. Confirmed correct patient, procedure, site, and patient consented.   Anesthesia Topical anesthesia was used. Anesthetic medications included Lidocaine 2%, Proparacaine 0.5%.   Procedure Preparation  included 5% betadine to ocular surface, eyelid speculum. A supplied needle was used.   Injection:  1.25 mg Bevacizumab (AVASTIN) SOLN   NDC: [id number], Lot: [id number] , Expiration date: 10/11/2019   Route: Intravitreal, Site: Right Eye, Waste: 0 mL  Post-op Post injection exam found visual acuity of at least counting fingers. The patient tolerated the procedure well. There were no complications. The patient received written and verbal post procedure care education.                 ASSESSMENT/PLAN:    ICD-10-CM   1. Branch retinal vein occlusion of right eye with macular edema  H34.8310 Intravitreal Injection, Pharmacologic Agent - OD - Right Eye    Bevacizumab (AVASTIN) SOLN 1.25 mg  2. Diabetes mellitus type 2 without retinopathy ([place])  E11.9   3. Epiretinal membrane (ERM) of both eyes  H35.373   4. Retinal edema  H35.81 OCT, Retina - OU - Both Eyes  5. Pseudophakia of both eyes  Z96.1   6. Ocular hypertension of right eye  H40.051   7. Glaucoma suspect of right eye  H40.001   8. Essential hypertension  I10   9. Hypertensive retinopathy of both eyes  H35.033   10. Headache above the eye region  R51   11. Combined forms of age-related cataract of both eyes  H25.813     1. BRVO with CME OD  - at presentation, BCVA 20/60 OD -- down from 20/30  - initial OCT w/ CME superior macula  - s/p IVA OD #1 (10.18.19), #2 (11.19.19), #3 (12.17.19), #4 (02.19.20), #5 (03.23.20), #6 (04.21.20), #7 (05.27.20), #8 (07.07.20)  - good response to medications, but held IVA in January due to cataract surgery  - today, BCVA 20/40 from 20/25 OD, OCT w/ interval increase in IRF  - recommend IVA OD #9 today, 08.25.20 w/ decrease in interval to 5 wks  - pt wishes to proceed  - RBA of procedure discussed,  questions answered  - informed consent obtained and signed  - see procedure note  - f/u 5 weeks -- DFE/OCT/possible injection/possible tapering of drops  2. Diabetes mellitus, type 2  without retinopathy  - The incidence, risk factors for progression, natural history and treatment options for diabetic retinopathy  were discussed with patient.    - The need for close monitoring of blood glucose, blood pressure, and serum lipids, avoiding cigarette or any type of tobacco, and the need for long term follow up was also discussed with patient.  3,4. Epiretinal membrane, OU  - relatively mild ERM OU -- blunted central foveal depression  - OD with mild cystic changes 2/2 BRVO as above; OS without cystic changes or edema  - discussed findings and prognosis  - recommend monitoring for now  5. Pseudophakia OU  - s/p CE/IOL OS 11.13.19 w/ Dr. [name]  - s/p CE/IOL OD 01.16.20 w/ Dr. [name]  - beautiful surgeries -- IOLs in perfect position  - healing well post-operatively  - IRF/CME OD may have been partly due to post op CME ([place]) in addition to BRVO  - cont PF - now Qdaily OD  6,7. Ocular hypertension / Glaucoma suspect OD>OS  - IOP ~25 OD, 20 OS at prior clinic visits  - today IOP 10 OS, OD is 10 -- on cosopt BID OU  - denies any family hx of glaucoma  - under the expert care of Dr. [name] who is actively managing  - cont Cosopt BID OD  - cont brimonidine QDaily OD  8-10. Hypertensive retinopathy OU w/ symptomatic headache  - pt presented to 6.24.2020 visit urgently for right sided headache above right eye with extension to occiput  - BP at that time was 201/100 -- pt was sent to primary care clinic for urgent evaluation and meds were adjusted  - BP now under better control and headaches improved  - discussed importance of tight BP control   Ophthalmic Meds Ordered this visit:  Meds ordered this encounter  Medications  . Bevacizumab (AVASTIN) SOLN 1.25 mg       Return in about 5 weeks (around 09/26/2019) for f/u BRVO OD, DFE, OCT.  There are no Patient Instructions on file for this visit.   Explained the diagnoses, plan, and follow up with the patient  and they expressed understanding.  Patient expressed understanding of the importance of proper follow up care.   This document serves as a record of services personally performed by Gardiner Sleeper, MD, PhD. It was created on their behalf by Ernest Mallick, OA, an ophthalmic assistant. The creation of this record is the provider's dictation and/or activities during the visit.    Electronically signed by: Ernest Mallick, OA 08.24.2020  8:16 PM      Gardiner Sleeper, M.D., Ph.D. Diseases & Surgery of the Retina and Vitreous [facility]  I have reviewed the above documentation for accuracy and completeness, and I agree with the above. Gardiner Sleeper, M.D., Ph.D. 08/23/19 10:28 PM      Abbreviations: M myopia (nearsighted); A astigmatism; H hyperopia (farsighted); P presbyopia; Mrx spectacle prescription;  CTL contact lenses; OD right eye; OS left eye; OU both eyes  XT exotropia; ET esotropia; PEK punctate epithelial keratitis; PEE punctate epithelial erosions; DES dry eye syndrome; MGD meibomian gland dysfunction; ATs artificial tears; PFAT's preservative free artificial tears; [place] nuclear sclerotic cataract; PSC posterior subcapsular cataract; ERM epi-retinal membrane; PVD posterior vitreous detachment; RD retinal detachment; DM  diabetes mellitus; DR diabetic retinopathy; NPDR non-proliferative diabetic retinopathy; PDR proliferative diabetic retinopathy; CSME clinically significant macular edema; DME diabetic macular edema; dbh dot blot hemorrhages; CWS cotton wool spot; POAG primary open angle glaucoma; C/D cup-to-disc ratio; HVF humphrey visual field; GVF goldmann visual field; OCT optical coherence tomography; IOP intraocular pressure; BRVO Branch retinal vein occlusion; CRVO central retinal vein occlusion; CRAO central retinal artery occlusion; BRAO branch retinal artery occlusion; RT retinal tear; SB scleral buckle; PPV pars plana vitrectomy; VH Vitreous hemorrhage; PRP  panretinal laser photocoagulation; IVK intravitreal kenalog; VMT vitreomacular traction; MH Macular hole;  NVD neovascularization of the disc; NVE neovascularization elsewhere; AREDS age related eye disease study; ARMD age related macular degeneration; POAG primary open angle glaucoma; EBMD epithelial/anterior basement membrane dystrophy; ACIOL anterior chamber intraocular lens; IOL intraocular lens; PCIOL posterior chamber intraocular lens; Phaco/IOL phacoemulsification with intraocular lens placement; [place] photorefractive keratectomy; LASIK laser assisted in situ keratomileusis; HTN hypertension; DM diabetes mellitus; COPD chronic obstructive pulmonary disease

## 2019-08-22 ENCOUNTER — Ambulatory Visit (INDEPENDENT_AMBULATORY_CARE_PROVIDER_SITE_OTHER): Payer: Medicare Other | Admitting: Ophthalmology

## 2019-08-22 ENCOUNTER — Encounter (INDEPENDENT_AMBULATORY_CARE_PROVIDER_SITE_OTHER): Payer: Self-pay | Admitting: Ophthalmology

## 2019-08-22 ENCOUNTER — Other Ambulatory Visit: Payer: Self-pay

## 2019-08-22 DIAGNOSIS — H3581 Retinal edema: Secondary | ICD-10-CM | POA: Diagnosis not present

## 2019-08-22 DIAGNOSIS — E119 Type 2 diabetes mellitus without complications: Secondary | ICD-10-CM

## 2019-08-22 DIAGNOSIS — H35373 Puckering of macula, bilateral: Secondary | ICD-10-CM

## 2019-08-22 DIAGNOSIS — H34831 Tributary (branch) retinal vein occlusion, right eye, with macular edema: Secondary | ICD-10-CM

## 2019-08-22 DIAGNOSIS — I10 Essential (primary) hypertension: Secondary | ICD-10-CM

## 2019-08-22 DIAGNOSIS — H40051 Ocular hypertension, right eye: Secondary | ICD-10-CM

## 2019-08-22 DIAGNOSIS — H40001 Preglaucoma, unspecified, right eye: Secondary | ICD-10-CM

## 2019-08-22 DIAGNOSIS — R519 Headache, unspecified: Secondary | ICD-10-CM

## 2019-08-22 DIAGNOSIS — H35033 Hypertensive retinopathy, bilateral: Secondary | ICD-10-CM

## 2019-08-22 DIAGNOSIS — R51 Headache: Secondary | ICD-10-CM

## 2019-08-22 DIAGNOSIS — Z961 Presence of intraocular lens: Secondary | ICD-10-CM

## 2019-08-23 ENCOUNTER — Encounter (INDEPENDENT_AMBULATORY_CARE_PROVIDER_SITE_OTHER): Payer: Self-pay | Admitting: Ophthalmology

## 2019-08-23 MED ORDER — BEVACIZUMAB CHEMO INJECTION 1.25MG/0.05ML SYRINGE FOR KALEIDOSCOPE
1.2500 mg | INTRAVITREAL | Status: AC | PRN
  Administered 2019-08-23: 1.25 mg via INTRAVITREAL

## 2019-08-29 ENCOUNTER — Encounter: Payer: Self-pay | Admitting: Neurology

## 2019-08-29 ENCOUNTER — Ambulatory Visit: Payer: Medicare Other | Admitting: Neurology

## 2019-08-29 ENCOUNTER — Other Ambulatory Visit: Payer: Self-pay

## 2019-08-29 VITALS — BP 128/63 | HR 63 | Ht 62.0 in | Wt 115.0 lb

## 2019-08-29 DIAGNOSIS — R413 Other amnesia: Secondary | ICD-10-CM | POA: Diagnosis not present

## 2019-08-29 MED ORDER — DONEPEZIL HCL 10 MG PO TABS: 10.0000 mg | ORAL_TABLET | Freq: Every day | ORAL | 5 refills | Status: DC

## 2019-08-29 NOTE — Patient Instructions (Signed)
Please follow-up with your primary care physician regarding your weight loss.  You have lost over 10 pounds since we first met.  Please try to eat well and drink plenty of water, 6 cups/day if possible.  We will increase your memory medication called donepezil to 1 pill daily which is 10 mg daily.  Please try to wear your hearing aids which will help you with communication and is an important part of memory management, to be able to hear well. Please follow-up in 3 months with the nurse practitioner.

## 2019-08-29 NOTE — Progress Notes (Signed)
Subjective:    Patient ID: Alejandra Davis is a 83 y.o. female.  HPI     Interim history:   Alejandra Davis is an 83 year old right-handed woman with an underlying medical history of diabetes, hypertension, and hypothyroidism, who presents for follow-up consultation of her memory loss.  The patient is accompanied by her niece, Alejandra Davis, today.  I first met her on 05/09/2019 at the request of her primary care physician, at which time her daughter reported that patient had memory loss for the past 8 years with gradual progression, more noticeable in the past 2 years. Her MMSE was 20 out of 30 at the time, she had a recent brain MRI and we discussed the findings.  She was advised to proceed with a sleep study.  I also suggested she start low-dose generic Alejandra Davis 5 mg daily.  She has had recent labs in March and May 2020.  She did not have a sleep study.  Today, 08/29/2019: She reports doing okay, does not provide a whole lot of details of her history, her niece reports that there has been loss of weight and loss of appetite.  She seems to tolerate the Alejandra Davis 5 mg, she has had the occasional GI complaints including stomach cramping and nausea but no vomiting.  No diarrhea reported.  She does not drink water or any fluids very well.  She decided not to pursue her sleep study.  She is not wearing her hearing aids.  Her family is encouraging its but she has not been wearing them.  The patient's allergies, current medications, family history, past medical history, past social history, past surgical history and problem list were reviewed and updated as appropriate.   Previously:   05/09/2019: (She) reports forgetfulness for the past few years.  Her daughter reports that she has had memory loss for the past 8 years, it started gradually, was slowly progressive but more noticeable in the past 2 years.  She has not had any major confusion or hallucinations or personality or mood changes.  She lives alone, she is  divorced, has 1 son who lives close by and one niece who lives close by, daughter lives in [place].  She is a non-smoker and drinks caffeine and limitation, typically 1 cup of coffee in the morning, occasional tea, occasional soda, typically no milk, some orange juice with breakfast.  She does snore, she may have had some breathing pauses while asleep as witnessed by her daughter when they were on vacation once and shared room.  Patient denies any significant daytime somnolence.  She does not have night to night nocturia, does have recurrent headaches but is not sure if she wakes up with headaches in the middle of the night or first thing in the morning.  The first symptoms with regard to her memory have been repeating herself for asking the same questions over again.  This was first noticed by her niece during a family gathering some 8 years ago.  She has some family history of memory loss, mom had memory issues in her late years, died at late 68s, father lived also to be in the late 69s, may have had some minor memory issues but patient's daughter does not recall grandfather to have major memory issues, she feels he was quite sharp.  She does remember that patient's mom had some memory issues.  Patient also reports maternal grandmother had memory loss.  Paternal grandmother and paternal aunt may have had some memory issues as well.  No actual diagnosis of Alzheimer's disease in the family.  She lives alone, she still drives, daughter is somewhat worried about her driving but patient limits her driving typically to local routes and familiar places.  She has not fallen recently.  She had a prior head CT with and without contrast on 10/31/2003 and the daughter brought a report.  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  I reviewed your office note from September 2019.  She has had recent blood work which I reviewed, she had CBC, CMP, INR, ESR on 05/01/2019 with largely unremarkable results.  She  had a TSH and urinalysis on 03/13/2019, with unremarkable results, A1c in March 2020 is slightly higher than before but still below 7 at 6.9.  She had a recent brain MRI without contrast on 05/02/2019 and I reviewed the results: IMPRESSION: 1. No acute intracranial abnormality identified. 2. Moderate chronic microvascular ischemic changes and volume loss of the brain. Small chronic infarct in right hemi pons.   She had a head CT without contrast on 05/01/2019 and I reviewed the results: IMPRESSION: Atrophy, chronic microvascular disease.   No acute intracranial abnormality. Of note, she presented to the emergency room on 05/01/2019 with new onset headache, has had a prior headache similar to the 1.  She was treated symptomatically, she had a head CT and brain MRI.  She had an elevated blood pressure.  Her Past Medical History Is Significant For: Past Medical History:  Diagnosis Date  . Chicken pox   . Cystocele   . Diabetes mellitus without complication ([place])   . Hypertension   . Thyroid disease    hypothyroidism    Her Past Surgical History Is Significant For: Past Surgical History:  Procedure Laterality Date  . ABDOMINAL HYSTERECTOMY  1990   TAH BSO  . OOPHORECTOMY     BSO  . Vaginal Bx     Papilloma    Her Family History Is Significant For: Family History  Problem Relation Age of Onset  . Sickle cell anemia Other   . Hypertension Mother   . Cancer Father        LIVER  . Heart disease Brother   . Cancer Brother        STOMACH    Her Social History Is Significant For: Social History   Socioeconomic History  . Marital status: Divorced    Spouse name: Not on file  . Number of children: Not on file  . Years of education: Not on file  . Highest education level: Not on file  Occupational History  . Not on file  Social Needs  . Financial resource strain: Not on file  . Food insecurity    Worry: Not on file    Inability: Not on file  . Transportation needs    Medical:  Not on file    Non-medical: Not on file  Tobacco Use  . Smoking status: Never Smoker  . Smokeless tobacco: Never Used  Substance and Sexual Activity  . Alcohol use: No  . Drug use: No  . Sexual activity: Not on file  Lifestyle  . Physical activity    Days per week: Not on file    Minutes per session: Not on file  . Stress: Not on file  Relationships  . Social Herbalist on phone: Not on file    Gets together: Not on file    Attends religious service: Not on file    Active member of club  or organization: Not on file    Attends meetings of clubs or organizations: Not on file    Relationship status: Not on file  Other Topics Concern  . Not on file  Social History Narrative  . Not on file    Her Allergies Are:  No Known Allergies:   Her Current Medications Are:  Outpatient Encounter Medications as of 08/29/2019  Medication Sig  . amLODipine (NORVASC) 10 MG tablet Take 1 tablet (10 mg total) by mouth daily.  . brimonidine (ALPHAGAN) 0.2 % ophthalmic solution Place 1 drop into both eyes 2 (two) times a day.  . calcium-vitamin D (OSCAL 500/200 D-3) 500-200 MG-UNIT tablet Take 1 tablet by mouth 2 (two) times daily.  Marland Kitchen donepezil (Alejandra Davis) 10 MG tablet Take 0.5 tablets (5 mg total) by mouth at bedtime.  . dorzolamide-timolol (COSOPT) 22.3-6.8 MG/ML ophthalmic solution Place 1 drop into the right eye 2 (two) times daily.  Marland Kitchen ketorolac (ACULAR) 0.5 % ophthalmic solution Place 1 drop into the right eye 4 (four) times daily.  Marland Kitchen levothyroxine (SYNTHROID, LEVOTHROID) 75 MCG tablet TAKE 1 TABLET(75 MCG) BY MOUTH DAILY (Patient taking differently: Take 75 mcg by mouth daily before breakfast. )  . metFORMIN (GLUCOPHAGE-XR) 500 MG 24 hr tablet Take 1 tablet (500 mg total) by mouth at bedtime.  . metoprolol succinate (TOPROL-XL) 50 MG 24 hr tablet Take 1 tablet (50 mg total) by mouth daily.  . prednisoLONE acetate (PRED FORTE) 1 % ophthalmic suspension Place 1 drop into the right eye 4  (four) times daily.  . [DISCONTINUED] triamcinolone ointment (KENALOG) 0.1 % Apply 1 application topically 2 (two) times daily. For rash on thighs and arms.  . chlorthalidone (HYGROTON) 25 MG tablet TAKE 1/2 TABLET(12.5 MG) BY MOUTH DAILY (Patient not taking: Reported on 08/29/2019)   Facility-Administered Encounter Medications as of 08/29/2019  Medication  . Bevacizumab (AVASTIN) SOLN 1.25 mg  . Bevacizumab (AVASTIN) SOLN 1.25 mg  . Bevacizumab (AVASTIN) SOLN 1.25 mg  . Bevacizumab (AVASTIN) SOLN 1.25 mg  :  Review of Systems:  Out of a complete 14 point review of systems, all are reviewed and negative with the exception of these symptoms as listed below: Review of Systems  Neurological:       Pt presents today for follow up. She declined a sleep study. Pt is taking Alejandra Davis 74m QHS and feels that it is tolerated well.    Objective:  Neurological Exam  Physical Exam Physical Examination:   Vitals:   08/29/19 1449  BP: 128/63  Pulse: 63    General Examination: The patient is a very pleasant 83y.o. female in no acute distress. She appears thin, well groomed.   HEENT: Normocephalic, atraumatic, pupils are equal, round and reactive to light and accommodation. She is status post bilateral cataract surgeries. Extraocular tracking is good without limitation to gaze excursion or nystagmus noted. Normal smooth pursuit is noted. Hearing is impaired, no hearing aids. Face is symmetric with normal facial animation. Speech is clear with no dysarthria noted, speech is scant. There is no lip, neck/head, jaw or voice tremor. Neck is supple with full range of passive and active motion. Oropharynx exam reveals: mild to moderate mouth dryness, adequate dental hygiene and moderate airway crowding, due to Wider uvula. Tongue protrudes centrally and palate elevates symmetrically.   Chest: Clear to auscultation without wheezing, rhonchi or crackles noted.  Heart: S1+S2+0, regular and normal without  murmurs, rubs or gallops noted.   Abdomen: Soft, non-tender and non-distended with normal  bowel sounds appreciated on auscultation.  Extremities: There is no pitting edema in the distal lower extremities bilaterally.   Skin: Warm and dry without trophic changes noted.  Musculoskeletal: exam reveals no obvious joint deformities, tenderness or joint swelling or erythema.   Neurologically:  Mental status: The patient is awake, alert and paying good attention.  She is not able to provide her own history in any detail.  Her history is primarily provided by her niece.  The patient is able to answer simple questions in short answers. Her memory, particularly her short-term memory is impaired.  (On 05/09/2019: MMSE: 20/30, CDT: 4/4, AFT: 6/min.)   Thought process: She has a tendency to repeatedly asked the same question such as what her memory medication looks like and what it is for, And what the name of it is. Mood is normal and affect is normal.  Cranial nerves II - XII are as described above under HEENT exam.  Motor exam: Thin built, global strength of 4+ out of 5, no tremor noted, reflexes are 1+.  Fine motor skills grossly intact for age.  Cerebellar testing: No dysmetria or intention tremor. There is no truncal or gait ataxia.  Sensory exam: intact to light touch.  Gait, station and balance: She stands easily. No veering to one side is noted. No leaning to one side is noted. Posture is age-appropriate and stance is narrow based. Gait shows normal stride length and normal pace. No problems turning are noted.               Assessment and Plan:   In summary, PHEOBE SANDIFORD is a very pleasant 83 year old female with an underlying medical history of diabetes, hypertension, and hypothyroidism, who presents for Follow-up consultation of her memory loss.  She has a full year history of forgetfulness and repeating herself.  She had a brain MRI in the interim in 2020 which showed moderate changes  in keeping with white matter changes, and moderate volume loss.  She does give a history of memory loss in her family, she may have a combination of Alzheimer's dementia and vascular dementia.  She does have vascular risk factors.  She has so far declined a sleep study evaluation, think about it, we can always order a sleep study down the road.  She has been on low-dose donepezil since May 2020 and seems to tolerate it okay.  She did have some appetite loss and weight loss.  She is encouraged to follow-up with her primary care physician regarding unwanted weight loss.  She may need further work-up.  We will monitor for side effects with the donepezil but I did encourage her to increase it to a whole pill daily which is 10 mg daily. I discussed with the patient and her niece the importance of keeping up with good hydration, good nutrition and getting enough rest. The patient is advised to follow-up in 3 months with 1 of our nurse practitioners.  I answered all their questions today and the patient and Santiago Glad were in agreement. I spent 25 minutes in total face-to-face time with the patient, more than 50% of which was spent in counseling and coordination of care, reviewing test results, reviewing medication and discussing or reviewing the diagnosis of dementia, its prognosis and treatment options. Pertinent laboratory and imaging test results that were available during this visit with the patient were reviewed by me and considered in my medical decision making (see chart for details).

## 2019-09-12 ENCOUNTER — Other Ambulatory Visit: Payer: Self-pay

## 2019-09-12 ENCOUNTER — Other Ambulatory Visit: Payer: Self-pay | Admitting: Family Medicine

## 2019-09-12 DIAGNOSIS — I10 Essential (primary) hypertension: Secondary | ICD-10-CM

## 2019-09-12 DIAGNOSIS — E079 Disorder of thyroid, unspecified: Secondary | ICD-10-CM

## 2019-09-12 DIAGNOSIS — E119 Type 2 diabetes mellitus without complications: Secondary | ICD-10-CM

## 2019-09-12 MED ORDER — AMLODIPINE BESYLATE 10 MG PO TABS: 10.0000 mg | ORAL_TABLET | Freq: Every day | ORAL | 1 refills | Status: DC

## 2019-09-12 MED ORDER — LEVOTHYROXINE SODIUM 75 MCG PO TABS: ORAL_TABLET | ORAL | 1 refills | Status: DC

## 2019-09-13 ENCOUNTER — Ambulatory Visit: Payer: Medicare Other | Admitting: Family Medicine

## 2019-09-16 ENCOUNTER — Other Ambulatory Visit: Payer: Self-pay

## 2019-09-16 ENCOUNTER — Emergency Department (HOSPITAL_COMMUNITY): Payer: Medicare Other

## 2019-09-16 ENCOUNTER — Encounter (HOSPITAL_COMMUNITY): Payer: Self-pay

## 2019-09-16 ENCOUNTER — Emergency Department (HOSPITAL_COMMUNITY)
Admission: EM | Admit: 2019-09-16 | Discharge: 2019-09-16 | Disposition: A | Discharge status: Home / Self Care | Payer: Medicare Other | Attending: Emergency Medicine | Admitting: Emergency Medicine

## 2019-09-16 DIAGNOSIS — Z79899 Other long term (current) drug therapy: Secondary | ICD-10-CM | POA: Diagnosis not present

## 2019-09-16 DIAGNOSIS — N390 Urinary tract infection, site not specified: Secondary | ICD-10-CM | POA: Insufficient documentation

## 2019-09-16 DIAGNOSIS — Z7984 Long term (current) use of oral hypoglycemic drugs: Secondary | ICD-10-CM | POA: Diagnosis not present

## 2019-09-16 DIAGNOSIS — R634 Abnormal weight loss: Secondary | ICD-10-CM

## 2019-09-16 DIAGNOSIS — I10 Essential (primary) hypertension: Secondary | ICD-10-CM | POA: Diagnosis not present

## 2019-09-16 DIAGNOSIS — R531 Weakness: Secondary | ICD-10-CM | POA: Diagnosis not present

## 2019-09-16 DIAGNOSIS — R1013 Epigastric pain: Secondary | ICD-10-CM | POA: Insufficient documentation

## 2019-09-16 DIAGNOSIS — E119 Type 2 diabetes mellitus without complications: Secondary | ICD-10-CM | POA: Insufficient documentation

## 2019-09-16 DIAGNOSIS — R630 Anorexia: Secondary | ICD-10-CM | POA: Diagnosis present

## 2019-09-16 LAB — URINALYSIS, ROUTINE W REFLEX MICROSCOPIC
Bilirubin Urine: NEGATIVE
Glucose, UA: NEGATIVE mg/dL
Hgb urine dipstick: NEGATIVE
Ketones, ur: 5 mg/dL — AB
Nitrite: NEGATIVE
Protein, ur: 30 mg/dL — AB
Specific Gravity, Urine: 1.021 (ref 1.005–1.030)
pH: 6 (ref 5.0–8.0)

## 2019-09-16 LAB — BASIC METABOLIC PANEL
Anion gap: 10 (ref 5–15)
BUN: 13 mg/dL (ref 8–23)
CO2: 25 mmol/L (ref 22–32)
Calcium: 9.6 mg/dL (ref 8.9–10.3)
Chloride: 103 mmol/L (ref 98–111)
Creatinine, Ser: 0.94 mg/dL (ref 0.44–1.00)
GFR calc Af Amer: >60 mL/min (ref >60)
GFR calc non Af Amer: 57 mL/min — ABNORMAL LOW (ref >60)
Glucose, Bld: 146 mg/dL — ABNORMAL HIGH (ref 70–99)
Potassium: 4.2 mmol/L (ref 3.5–5.1)
Sodium: 138 mmol/L (ref 135–145)

## 2019-09-16 LAB — HEPATIC FUNCTION PANEL
ALT: 17 U/L (ref 0–44)
AST: 20 U/L (ref 15–41)
Albumin: 3.9 g/dL (ref 3.5–5.0)
Alkaline Phosphatase: 63 U/L (ref 38–126)
Bilirubin, Direct: <0.1 mg/dL (ref 0.0–0.2)
Total Bilirubin: 0.8 mg/dL (ref 0.3–1.2)
Total Protein: 7.9 g/dL (ref 6.5–8.1)

## 2019-09-16 LAB — CBC
HCT: 42 % (ref 36.0–46.0)
Hemoglobin: 13.8 g/dL (ref 12.0–15.0)
MCH: 27.4 pg (ref 26.0–34.0)
MCHC: 32.9 g/dL (ref 30.0–36.0)
MCV: 83.5 fL (ref 80.0–100.0)
Platelets: 245 10*3/uL (ref 150–400)
RBC: 5.03 MIL/uL (ref 3.87–5.11)
RDW: 17.6 % — ABNORMAL HIGH (ref 11.5–15.5)
WBC: 6.9 10*3/uL (ref 4.0–10.5)
nRBC: 0 % (ref 0.0–0.2)

## 2019-09-16 LAB — LIPASE, BLOOD: Lipase: 24 U/L (ref 11–51)

## 2019-09-16 LAB — TROPONIN I (HIGH SENSITIVITY)
Troponin I (High Sensitivity): 5 ng/L (ref <18)
Troponin I (High Sensitivity): 6 ng/L (ref <18)

## 2019-09-16 MED ORDER — IOHEXOL 300 MG/ML  SOLN
100.0000 mL | Freq: Once | INTRAMUSCULAR | Status: AC | PRN
  Administered 2019-09-16: 100 mL via INTRAVENOUS

## 2019-09-16 MED ORDER — CEPHALEXIN 500 MG PO CAPS: 500.0000 mg | ORAL_CAPSULE | Freq: Two times a day (BID) | ORAL | 0 refills | Status: DC

## 2019-09-16 NOTE — Discharge Instructions (Addendum)
Take antibiotics as directed.  Stay well hydrated. Have your blood pressure rechecked in 1 week. See a clinician if you develop fevers, blood in stool, severe back pain or new concerns.

## 2019-09-16 NOTE — ED Provider Notes (Signed)
[facility] EMERGENCY DEPARTMENT Provider Note   CSN: [id number] Arrival date & time: 09/16/19  1045     History   Chief Complaint Chief Complaint  Patient presents with   Weakness    HPI Alejandra Davis is a 83 y.o. female.     HPI  Last night began to have stomach acting up, not feeling right. Decreased appetite.  Went to bed and woke up this AM and stomach was worse than it was last night.  Afraid to drink water. Pain epigastric area, couldn't eat anything. Feels like have an appetite but when eat having worsening pain.  Yesterday was the first episode like this, more severe. Hungry but afraid to eat.  Has had episodes of pain over last month per daughter but pt reports it started last night. Not every time she eats.  Some nausea.  Last night also had nausea. No vomiting.  Small amount of diarrhea, started last night, 3-4 times. No black or bloody stools.  No urinary symptoms. No fever. No CP/dyspnea, no cough.  Has had 13lb weight loss in the last month.  Had been eating well recently per daughter until yesterday.  Weight loss still there and concerning.   Past Medical History:  Diagnosis Date   Chicken pox    Cystocele    Diabetes mellitus without complication ([place])    Hypertension    Thyroid disease    hypothyroidism    Patient Active Problem List   Diagnosis Date Noted   Flexural atopic dermatitis 05/30/2019   Presbycusis of both ears 05/11/2019   Acquired hypothyroidism 03/13/2019   Murmur 09/01/2018   Telogen effluvium 06/07/2018   Eczema of scalp 03/31/2018   Xerosis cutis 02/25/2018   Immunization reaction 02/08/2018   Keratosis pilaris 02/08/2018   Health care maintenance 02/07/2018   Controlled type 2 diabetes mellitus without complication, without long-term current use of insulin ([place]) 02/07/2018   Cystocele    Osteopenia    Thyroid disease    Hypertension     Past Surgical History:  Procedure Laterality Date     ABDOMINAL HYSTERECTOMY  1990   TAH BSO   OOPHORECTOMY     BSO   Vaginal Bx     Papilloma     OB History    Gravida  3   Para  2   Term  2   Preterm      AB  1   Living  2     SAB  1   TAB      Ectopic      Multiple      Live Births               Home Medications    Prior to Admission medications   Medication Sig Start Date End Date Taking? Authorizing Provider  acetaminophen (TYLENOL) 325 MG tablet Take 325-650 mg by mouth every 6 (six) hours as needed for headache (pain).   Yes [provider]  amLODipine (NORVASC) 10 MG tablet Take 1 tablet (10 mg total) by mouth daily. 09/12/19  Yes Libby Maw, MD  bevacizumab (AVASTIN) 1.25 mg/0.1 mL SOLN 1.25 mg by Intravitreal route every 30 (thirty) days. Administered by Dr. Coralyn Pear   Yes [provider]  brimonidine (ALPHAGAN) 0.2 % ophthalmic solution Place 1 drop into both eyes 2 (two) times a day. Patient taking differently: Place 1 drop into both eyes daily.  06/21/19  Yes [name], MD  calcium-vitamin D (OSCAL 500/200  D-3) 500-200 MG-UNIT tablet Take 1 tablet by mouth 2 (two) times daily. 09/01/18  Yes Libby Maw, MD  donepezil (ARICEPT) 10 MG tablet Take 1 tablet (10 mg total) by mouth at bedtime. Patient taking differently: Take 10 mg by mouth daily.  08/29/19  Yes Star Age, MD  dorzolamide-timolol (COSOPT) 22.3-6.8 MG/ML ophthalmic solution Place 1 drop into the right eye 2 (two) times daily. 05/24/19 05/23/20 Yes [name], MD  levothyroxine (SYNTHROID) 75 MCG tablet TAKE 1 TABLET(75 MCG) BY MOUTH DAILY Patient taking differently: Take 75 mcg by mouth daily.  09/12/19  Yes Libby Maw, MD  metFORMIN (GLUCOPHAGE-XR) 500 MG 24 hr tablet TAKE 1 TABLET(500 MG) BY MOUTH AT BEDTIME Patient taking differently: Take 500 mg by mouth daily.  09/12/19  Yes Libby Maw, MD  metoprolol succinate (TOPROL-XL) 50 MG 24 hr tablet TAKE 1 TABLET(50 MG) BY  MOUTH DAILY Patient taking differently: Take 50 mg by mouth daily.  09/13/19  Yes Libby Maw, MD  prednisoLONE acetate (PRED FORTE) 1 % ophthalmic suspension Place 1 drop into the right eye 4 (four) times daily. Patient taking differently: Place 1 drop into the right eye daily.  03/20/19  Yes [name], MD  cephALEXin (KEFLEX) 500 MG capsule Take 1 capsule (500 mg total) by mouth 2 (two) times daily. 09/16/19   Elnora Morrison, MD  chlorthalidone (HYGROTON) 25 MG tablet TAKE 1/2 TABLET(12.5 MG) BY MOUTH DAILY Patient not taking: Reported on 08/29/2019 06/21/19   Luetta Nutting, DO  ketorolac (ACULAR) 0.5 % ophthalmic solution Place 1 drop into the right eye 4 (four) times daily. Patient not taking: Reported on 09/16/2019 03/20/19   [name], MD    Family History Family History  Problem Relation Age of Onset   Sickle cell anemia Other    Hypertension Mother    Cancer Father        LIVER   Heart disease Brother    Cancer Brother        STOMACH    Social History Social History   Tobacco Use   Smoking status: Never Smoker   Smokeless tobacco: Never Used  Substance Use Topics   Alcohol use: No   Drug use: No     Allergies   Patient has no known allergies.   Review of Systems Review of Systems  Constitutional: Positive for fatigue and unexpected weight change. Negative for fever.  Respiratory: Negative for cough.   Gastrointestinal: Positive for abdominal pain, diarrhea and nausea. Negative for vomiting.  Genitourinary: Negative for difficulty urinating and dysuria.  Musculoskeletal: Negative for arthralgias.  Skin: Negative for rash.  Neurological: Negative for headaches.     Physical Exam Updated Vital Signs BP (!) 145/85 (BP Location: Right Arm)    Pulse (!) 101    Temp (!) 97.1 F (36.2 C) (Oral)    Resp 16    SpO2 97%   Physical Exam Vitals signs and nursing note reviewed.  Constitutional:      General: She is not in acute distress.     Appearance: She is well-developed. She is not diaphoretic.  HENT:     Head: Normocephalic and atraumatic.  Eyes:     Conjunctiva/sclera: Conjunctivae normal.  Neck:     Musculoskeletal: Normal range of motion.  Cardiovascular:     Rate and Rhythm: Normal rate and regular rhythm.  Pulmonary:     Effort: Pulmonary effort is normal. No respiratory distress.  Abdominal:     General: There is  no distension.     Palpations: Abdomen is soft.     Tenderness: There is abdominal tenderness (RUQ, epigastric). There is no guarding.  Musculoskeletal:        General: No tenderness.  Skin:    General: Skin is warm and dry.     Findings: No erythema or rash.  Neurological:     Mental Status: She is alert and oriented to person, place, and time.      ED Treatments / Results  Labs (all labs ordered are listed, but only abnormal results are displayed) Labs Reviewed  BASIC METABOLIC PANEL - Abnormal; Notable for the following components:      Result Value   Glucose, Bld 146 (*)    GFR calc non Af Amer 57 (*)    All other components within normal limits  CBC - Abnormal; Notable for the following components:   RDW 17.6 (*)    All other components within normal limits  URINALYSIS, ROUTINE W REFLEX MICROSCOPIC - Abnormal; Notable for the following components:   APPearance HAZY (*)    Ketones, ur 5 (*)    Protein, ur 30 (*)    Leukocytes,Ua MODERATE (*)    Bacteria, UA MANY (*)    All other components within normal limits  URINE CULTURE  HEPATIC FUNCTION PANEL  LIPASE, BLOOD  TROPONIN I (HIGH SENSITIVITY)  TROPONIN I (HIGH SENSITIVITY)    EKG EKG Interpretation  Date/Time:  Saturday September 16 2019 12:52:10 EDT Ventricular Rate:  73 PR Interval:    QRS Duration: 75 QT Interval:  390 QTC Calculation: 430 R Axis:   74 Text Interpretation:  Sinus rhythm Biatrial enlargement No significant change since last tracing Confirmed by Gareth Morgan [phone]) on 09/16/2019 1:13:46  PM   Radiology Ct Abdomen Pelvis W Contrast  Result Date: 09/16/2019 CLINICAL DATA:  Bowel a suction, low-grade. Loss of appetite and fatigue for 2 days. EXAM: CT ABDOMEN AND PELVIS WITH CONTRAST TECHNIQUE: Multidetector CT imaging of the abdomen and pelvis was performed using the standard protocol following bolus administration of intravenous contrast. CONTRAST:  11mL OMNIPAQUE IOHEXOL 300 MG/ML  SOLN COMPARISON:  CT abdomen dated 09/02/2017. FINDINGS: Lower chest: No acute abnormality. Ulcerative atherosclerotic plaque within the lower portion of the descending thoracic aorta. Hepatobiliary: Small layering stone within the otherwise normal-appearing gallbladder. No focal liver abnormality is seen. No bile duct dilatation appreciated. Pancreas: Unremarkable. No pancreatic ductal dilatation or surrounding inflammatory changes. Spleen: Normal in size without focal abnormality. Adrenals/Urinary Tract: Adrenal glands appear normal. Bilateral renal cysts. Kidneys otherwise unremarkable without suspicious mass, stone or hydronephrosis. No ureteral or bladder calculi identified. Bladder diverticulum, near the LEFT UVJ. Bladder otherwise unremarkable. Stomach/Bowel: No dilated large or small bowel loops. Diverticulosis throughout the colon, most pronounced within the LEFT colon, but no focal inflammatory change to suggest acute diverticulitis. Stomach is unremarkable, partially decompressed. No evidence of bowel wall inflammation. Appendix appears grossly normal. Vascular/Lymphatic: Aortic atherosclerosis. No enlarged lymph nodes seen in the abdomen or pelvis. Reproductive: Stable prominence of the vaginal cuff. No adnexal mass or free fluid seen. Other: No free fluid or abscess collection. No free intraperitoneal air. Musculoskeletal: No acute or suspicious osseous finding. Mild degenerative spondylosis of the lumbar spine. IMPRESSION: 1. No acute findings within the abdomen or pelvis. No bowel obstruction or  evidence of bowel wall inflammation. No evidence of acute solid organ abnormality. No renal or ureteral calculi. 2. Colonic diverticulosis without evidence of acute diverticulitis. 3. Cholelithiasis without evidence of acute cholecystitis.  4. Aortic atherosclerosis. 5. Additional chronic/incidental findings detailed above. Aortic Atherosclerosis (ICD10-I70.0). Electronically Signed   By: Franki Cabot M.D.   On: 09/16/2019 15:54    Procedures Procedures (including critical care time)  Medications Ordered in ED Medications  iohexol (OMNIPAQUE) 300 MG/ML solution 100 mL (100 mLs Intravenous Contrast Given 09/16/19 1515)     Initial Impression / Assessment and Plan / ED Course  I have reviewed the triage vital signs and the nursing notes.  Pertinent labs & imaging results that were available during my care of the patient were reviewed by me and considered in my medical decision making (see chart for details).        83yo female with history of htn, DM, thyroid disease, presents with concern for epigastric pain, fatigue, unintentional weight loss.   Labs show no sign of pancreatitis or hepatitis.  CT ordered to evaluate for signs of appendicitis/cholecystitis/obstruction/diverticulitis.  Troponin negative, doubt ACS given abdominal tenderness on exam, no significant troponin elevation or EKG changes.  CT and UA pending at time of transfer of care.  Discussed if negative, would recommend further PCP follow up, consider outpt RUQ [place] for cholelithiasis if no sign of this on CT.  Signed out with testing pending to Dr. Reather Converse.  Final Clinical Impressions(s) / ED Diagnoses   Final diagnoses:  Epigastric abdominal pain  Unintentional weight loss  Urinary tract infection without hematuria, site unspecified    ED Discharge Orders         Ordered    cephALEXin (KEFLEX) 500 MG capsule  2 times daily     09/16/19 [id number]           Gareth Morgan, MD 09/16/19 2130

## 2019-09-16 NOTE — ED Triage Notes (Signed)
Pt endorses loss of appetite and loss of energy x 2 days, denies exposure to illness. VSS

## 2019-09-16 NOTE — ED Provider Notes (Signed)
Patient CARE signed out to follow-up results and reassess.  Patient feels better on reassessment.  Patient has had loss of appetite, weight loss and lower abdominal discomfort.  Patient is well-appearing on exam.  CT scan no acute findings revealed with patient that she does have diverticulosis and gallstones and need outpatient follow-up if she develops symptoms.  Discussed antibiotics for urine infection.  Patient and family member comfortable with outpatient follow-up.  Golda Acre, MD 09/16/19 [phone]

## 2019-09-16 NOTE — ED Notes (Signed)
Patient verbalizes understanding of discharge instructions. Opportunity for questioning and answers were provided. Armband removed by staff, pt discharged from ED ambulatory.   

## 2019-09-17 LAB — URINE CULTURE: Culture: NO GROWTH

## 2019-09-18 ENCOUNTER — Encounter: Payer: Self-pay | Admitting: Neurology

## 2019-09-18 ENCOUNTER — Telehealth: Payer: Self-pay | Admitting: Neurology

## 2019-09-18 ENCOUNTER — Telehealth: Payer: Self-pay

## 2019-09-18 NOTE — Telephone Encounter (Signed)
I called pt's daughter, Sherlyn Hay, per DPR and discussed Dr. [name] recommendations. Pt's daughter is agreeable to this plan and will call us back in 1-2 weeks when her symptoms have resolved.

## 2019-09-18 NOTE — Telephone Encounter (Signed)
Please call patient or her daughter regarding side effects of the donepezil.  Please also see my chart email for reference.  She is having nausea and diarrhea.  Please ask them to reduce the donepezil to half a pill once daily for the next 3 days and then stop it altogether, give [place] a follow-up by phone call in about 1 to 2 weeks to see if symptoms have improved after being off of the medication, we can consider another medication once her symptoms have resolved as far as the side effects.

## 2019-09-18 NOTE — Telephone Encounter (Signed)
I called pt's daughter Sherlyn Hay, per DPR, to discuss. No answer, left a message asking her to call me back.

## 2019-09-18 NOTE — Telephone Encounter (Signed)
See telephone note from 09/18/19.

## 2019-09-25 ENCOUNTER — Other Ambulatory Visit: Payer: Self-pay

## 2019-09-25 DIAGNOSIS — I10 Essential (primary) hypertension: Secondary | ICD-10-CM

## 2019-09-25 MED ORDER — METOPROLOL SUCCINATE ER 50 MG PO TB24: ORAL_TABLET | ORAL | 1 refills | Status: DC

## 2019-09-25 MED ORDER — AMLODIPINE BESYLATE 10 MG PO TABS: 10.0000 mg | ORAL_TABLET | Freq: Every day | ORAL | 1 refills | Status: DC

## 2019-09-26 ENCOUNTER — Other Ambulatory Visit: Payer: Self-pay

## 2019-09-26 ENCOUNTER — Ambulatory Visit (INDEPENDENT_AMBULATORY_CARE_PROVIDER_SITE_OTHER): Payer: Medicare Other | Admitting: Ophthalmology

## 2019-09-26 ENCOUNTER — Encounter (INDEPENDENT_AMBULATORY_CARE_PROVIDER_SITE_OTHER): Payer: Self-pay | Admitting: Ophthalmology

## 2019-09-26 DIAGNOSIS — H34831 Tributary (branch) retinal vein occlusion, right eye, with macular edema: Secondary | ICD-10-CM

## 2019-09-26 DIAGNOSIS — H35033 Hypertensive retinopathy, bilateral: Secondary | ICD-10-CM

## 2019-09-26 DIAGNOSIS — H40051 Ocular hypertension, right eye: Secondary | ICD-10-CM

## 2019-09-26 DIAGNOSIS — Z961 Presence of intraocular lens: Secondary | ICD-10-CM

## 2019-09-26 DIAGNOSIS — H35373 Puckering of macula, bilateral: Secondary | ICD-10-CM

## 2019-09-26 DIAGNOSIS — E119 Type 2 diabetes mellitus without complications: Secondary | ICD-10-CM | POA: Diagnosis not present

## 2019-09-26 DIAGNOSIS — H3581 Retinal edema: Secondary | ICD-10-CM | POA: Diagnosis not present

## 2019-09-26 DIAGNOSIS — I10 Essential (primary) hypertension: Secondary | ICD-10-CM

## 2019-09-26 DIAGNOSIS — H40001 Preglaucoma, unspecified, right eye: Secondary | ICD-10-CM

## 2019-09-26 DIAGNOSIS — R51 Headache: Secondary | ICD-10-CM

## 2019-09-26 DIAGNOSIS — R519 Headache, unspecified: Secondary | ICD-10-CM

## 2019-09-26 MED ORDER — BEVACIZUMAB CHEMO INJECTION 1.25MG/0.05ML SYRINGE FOR KALEIDOSCOPE
1.2500 mg | INTRAVITREAL | Status: AC | PRN
  Administered 2019-09-26: 1.25 mg via INTRAVITREAL

## 2019-09-26 NOTE — Progress Notes (Signed)
[facility] Note  09/26/2019     CHIEF COMPLAINT Patient presents for Retina Follow Up   HISTORY OF PRESENT ILLNESS: Alejandra Davis is a 83 y.o. female who presents to the clinic today for:   HPI    Retina Follow Up    Patient presents with  CRVO/BRVO.  In right eye.  Severity is moderate.  Duration of 4.5 weeks.  Since onset it is stable.  I, the attending physician,  performed the HPI with the patient and updated documentation appropriately.          Comments    Patient states vision the same OU. Doesn't check BS. Last a1c was 6.9 on 03.16.20.       Last edited by [name], MD on 09/26/2019 11:13 AM. (History)    pts is slightly delayed to f\u up from 6 weeks to 7, pts niece is with her today  Lab Results  Component Value Date   HGBA1C 6.9 (H) 03/13/2019    Referring physician: Hortencia Pilar, MD [address],  [place] [address]  HISTORICAL INFORMATION:   Selected notes from the MEDICAL RECORD NUMBER Referred by [name] for DM exam LEE-  Ocular Hx-  PMH- HTN, thyroid disease, DM (A1C- 6.5)    CURRENT MEDICATIONS: Current Outpatient Medications (Ophthalmic Drugs)  Medication Sig  . brimonidine (ALPHAGAN) 0.2 % ophthalmic solution Place 1 drop into both eyes 2 (two) times a day. (Patient taking differently: Place 1 drop into the right eye daily. )  . dorzolamide-timolol (COSOPT) 22.3-6.8 MG/ML ophthalmic solution Place 1 drop into the right eye 2 (two) times daily.  Marland Kitchen ketorolac (ACULAR) 0.5 % ophthalmic solution Place 1 drop into the right eye 4 (four) times daily. (Patient not taking: Reported on 09/16/2019)  . prednisoLONE acetate (PRED FORTE) 1 % ophthalmic suspension Place 1 drop into the right eye 4 (four) times daily. (Patient not taking: Reported on 09/26/2019)   No current facility-administered medications for this visit.  (Ophthalmic Drugs)   Current Outpatient Medications (Other)  Medication Sig   . acetaminophen (TYLENOL) 325 MG tablet Take 325-650 mg by mouth every 6 (six) hours as needed for headache (pain).  Marland Kitchen amLODipine (NORVASC) 10 MG tablet Take 1 tablet (10 mg total) by mouth daily.  . calcium-vitamin D (OSCAL 500/200 D-3) 500-200 MG-UNIT tablet Take 1 tablet by mouth 2 (two) times daily.  . cephALEXin (KEFLEX) 500 MG capsule Take 1 capsule (500 mg total) by mouth 2 (two) times daily.  Marland Kitchen donepezil (ARICEPT) 10 MG tablet Take 1 tablet (10 mg total) by mouth at bedtime.  Marland Kitchen levothyroxine (SYNTHROID) 75 MCG tablet TAKE 1 TABLET(75 MCG) BY MOUTH DAILY (Patient taking differently: Take 75 mcg by mouth daily. )  . metFORMIN (GLUCOPHAGE-XR) 500 MG 24 hr tablet TAKE 1 TABLET(500 MG) BY MOUTH AT BEDTIME (Patient taking differently: Take 500 mg by mouth daily. )  . metoprolol succinate (TOPROL-XL) 50 MG 24 hr tablet TAKE 1 TABLET(50 MG) BY MOUTH DAILY  . bevacizumab (AVASTIN) 1.25 mg/0.1 mL SOLN 1.25 mg by Intravitreal route every 30 (thirty) days. Administered by Dr. Coralyn Pear  . chlorthalidone (HYGROTON) 25 MG tablet TAKE 1/2 TABLET(12.5 MG) BY MOUTH DAILY (Patient not taking: Reported on 08/29/2019)   Current Facility-Administered Medications (Other)  Medication Route  . Bevacizumab (AVASTIN) SOLN 1.25 mg Intravitreal  . Bevacizumab (AVASTIN) SOLN 1.25 mg Intravitreal  . Bevacizumab (AVASTIN) SOLN 1.25 mg Intravitreal  . Bevacizumab (AVASTIN)  SOLN 1.25 mg Intravitreal      REVIEW OF SYSTEMS: ROS    Positive for: Endocrine, Cardiovascular, Eyes   Negative for: Constitutional, Gastrointestinal, Neurological, Skin, Genitourinary, Musculoskeletal, HENT, Respiratory, Psychiatric, Allergic/Imm, Heme/Lymph   Last edited by Roselee Nova D, COT on 09/26/2019  9:43 AM. (History)       ALLERGIES No Known Allergies  PAST MEDICAL HISTORY Past Medical History:  Diagnosis Date  . Chicken pox   . Cystocele   . Diabetes mellitus without complication ([place])   . Hypertension   . Thyroid  disease    hypothyroidism   Past Surgical History:  Procedure Laterality Date  . ABDOMINAL HYSTERECTOMY  1990   TAH BSO  . OOPHORECTOMY     BSO  . Vaginal Bx     Papilloma    FAMILY HISTORY Family History  Problem Relation Age of Onset  . Sickle cell anemia Other   . Hypertension Mother   . Cancer Father        LIVER  . Heart disease Brother   . Cancer Brother        STOMACH    SOCIAL HISTORY Social History   Tobacco Use  . Smoking status: Never Smoker  . Smokeless tobacco: Never Used  Substance Use Topics  . Alcohol use: No  . Drug use: No         OPHTHALMIC EXAM:  Base Eye Exam    Visual Acuity (Snellen - Linear)      Right Left   Dist cc 20/40 20/25 -2   Dist ph cc NI NI   Correction: Glasses       Tonometry (Tonopen, 9:59 AM)      Right Left   Pressure 10 11       Pupils      Dark Light Shape React APD   Right 2 1 Round Slow None   Left 2 1 Round Slow None       Visual Fields (Counting fingers)      Left Right    Full Full       Extraocular Movement      Right Left    Full, Ortho Full, Ortho       Neuro/Psych    Oriented x3: Yes   Mood/Affect: Normal       Dilation    Both eyes: 1.0% Mydriacyl, 2.5% Phenylephrine @ 9:59 AM        Slit Lamp and Fundus Exam    Slit Lamp Exam      Right Left   Lids/Lashes Dermatochalasis - upper lid, Meibomian gland dysfunction Dermatochalasis - upper lid, Meibomian gland dysfunction   Conjunctiva/Sclera Melanosis, no injection Melanosis   Cornea Arcus, 1+ Punctate epithelial erosions, mild Anterior basement membrane dystrophy, Well healed cataract wounds, tr Descemet's folds tempoally Arcus, Inferior 1+ Punctate epithelial erosions; 2-3+ microcystic edema temporal with 2+ descemet folds, Keratic precipitates.  Well healed temporal cat sx incision.   Anterior Chamber Deep, no cell or flare Deep;  no cell or flare   Iris Round and dilated, No NVI Round and dilated, No NVI   Lens PC IOL in good  position, trace Posterior capsular opacification Posterior chamber intraocular lens in good position.  1+ PCO.   Vitreous Vitreous syneresis Vitreous syneresis       Fundus Exam      Right Left   Disc Sharp rim, inferior notch, 2-3+ pallor, inf. Rim thinning, mild disc heme at 11:00, attenuated vessels superiorly, superior hyperemia--improved 1+  pallor   C/D Ratio 0.7 0.6   Macula Flat, good foveal reflex, DBH temporal/superior macula - improved, Cystic changes--improved good foveal reflex, flat, RPE mottling, Epiretinal membrane, No heme or edema   Vessels Severe vascular attenuation superiorly, BRVO Mild vascular attenuation, AV crossing changes   Periphery Attached, superior DBH-persistent, but improved Attached, scattered Reticular degeneration        Refraction    Wearing Rx      Sphere Cylinder Axis Add   Right -0.75 +1.25 170 +2.50   Left -0.75 +1.00 003 +2.50   Type: PAL          IMAGING AND PROCEDURES  Imaging and Procedures for 03/07/18  Intravitreal Injection, Pharmacologic Agent - OD - Right Eye       Time Out 09/26/2019. 10:47 AM. Confirmed correct patient, procedure, site, and patient consented.   Anesthesia Topical anesthesia was used. Anesthetic medications included Lidocaine 2%, Proparacaine 0.5%.   Procedure Preparation included 5% betadine to ocular surface, eyelid speculum. A 30 gauge needle was used.   Injection:  1.25 mg Bevacizumab (AVASTIN) SOLN   NDC: [id number], Lot: [id number] , Expiration date: 12/14/2019   Route: Intravitreal, Site: Right Eye, Waste: 0 mL  Post-op Post injection exam found visual acuity of at least counting fingers. The patient tolerated the procedure well. There were no complications. The patient received written and verbal post procedure care education.        OCT, Retina - OU - Both Eyes       Right Eye Quality was good. Central Foveal Thickness: 265. Progression has improved. Findings include no SRF,  epiretinal membrane, normal foveal contour, intraretinal fluid (Blunted foveal depression; mild interval improvement in cystic changes).   Left Eye Quality was good. Central Foveal Thickness: 266. Progression has been stable. Findings include abnormal foveal contour, epiretinal membrane, no IRF, no SRF, vitreomacular adhesion  (Blunted foveal depression).   Notes *Images captured and stored on drive  Diagnosis / Impression:  ERM OU OD: Blunted foveal depression; mild interval improvement in cystic changes OS: very mild ERM with blunted foveal depression -- stable; mild VMA  Clinical management:  See below  Abbreviations: NFP - Normal foveal profile. CME - cystoid macular edema. PED - pigment epithelial detachment. IRF - intraretinal fluid. SRF - subretinal fluid. EZ - ellipsoid zone. ERM - epiretinal membrane. ORA - outer retinal atrophy. ORT - outer retinal tubulation. SRHM - subretinal hyper-reflective material                  ASSESSMENT/PLAN:    ICD-10-CM   1. Branch retinal vein occlusion of right eye with macular edema  H34.8310 Intravitreal Injection, Pharmacologic Agent - OD - Right Eye    Bevacizumab (AVASTIN) SOLN 1.25 mg  2. Diabetes mellitus type 2 without retinopathy ([place])  E11.9   3. Epiretinal membrane (ERM) of both eyes  H35.373   4. Retinal edema  H35.81 OCT, Retina - OU - Both Eyes  5. Pseudophakia of both eyes  Z96.1   6. Ocular hypertension of right eye  H40.051   7. Glaucoma suspect of right eye  H40.001   8. Essential hypertension  I10   9. Hypertensive retinopathy of both eyes  H35.033   10. Headache above the eye region  R51     1. BRVO with CME OD  - at presentation, BCVA 20/60 OD -- down from 20/30  - initial OCT w/ CME superior macula  - s/p IVA OD #1 (10.18.19), #2 (11.19.19), #3 (12.17.19), #  4 (02.19.20), #5 (03.23.20), #6 (04.21.20), #7 (05.27.20), #8 (07.07.20), #9 (08.25.20)  - good response to medications, but held IVA in January due to  cataract surgery  - today, BCVA stable at 20/40 today, OCT shows mild interval improvement in cystic changes   - recommend IVA OD #10 today, 09.29.20 w/ increase in interval to 5-6 wks  - pt wishes to proceed  - RBA of procedure discussed, questions answered  - informed consent obtained and signed  - see procedure note  - f/u 5-6 weeks -- DFE/OCT  2. Diabetes mellitus, type 2 without retinopathy  - The incidence, risk factors for progression, natural history and treatment options for diabetic retinopathy  were discussed with patient.    - The need for close monitoring of blood glucose, blood pressure, and serum lipids, avoiding cigarette or any type of tobacco, and the need for long term follow up was also discussed with patient.  3,4. Epiretinal membrane, OU  - relatively mild ERM OU -- blunted central foveal depression  - OD with mild cystic changes 2/2 BRVO as above; OS without cystic changes or edema  - discussed findings and prognosis  - recommend monitoring for now  5. Pseudophakia OU  - s/p CE/IOL OS 11.13.19 w/ Dr. [name]  - s/p CE/IOL OD 01.16.20 w/ Dr. [name]  - beautiful surgeries -- IOLs in perfect position  - healing well post-operatively  - IRF/CME OD may have been partly due to post op CME ([place]) in addition to BRVO  - okay to stop PF  6,7. Ocular hypertension / Glaucoma suspect OD>OS  - IOP ~25 OD, 20 OS at prior clinic visits  - today IOP 10 OS, OD is 11 -- on cosopt BID OU  - denies any family hx of glaucoma  - under the expert care of Dr. [name]  - cont Cosopt BID OD  - cont brimonidine QDaily OD  - pt and family state that no f/u appt scheduled with Dr. [name]  8-10. Hypertensive retinopathy OU w/ symptomatic headache  - pt presented to 6.24.2020 visit urgently for right sided headache above right eye with extension to occiput  - BP at that time was 201/100 -- pt was sent to primary care clinic for urgent evaluation and meds were adjusted  - BP now  under better control and headaches improved  - discussed importance of tight BP control   Ophthalmic Meds Ordered this visit:  Meds ordered this encounter  Medications  . Bevacizumab (AVASTIN) SOLN 1.25 mg       Return 5-6 weeks, for DFE, OCT.  There are no Patient Instructions on file for this visit.   Explained the diagnoses, plan, and follow up with the patient and they expressed understanding.  Patient expressed understanding of the importance of proper follow up care.   This document serves as a record of services personally performed by Gardiner Sleeper, MD, PhD. It was created on their behalf by Ernest Mallick, OA, an ophthalmic assistant. The creation of this record is the provider's dictation and/or activities during the visit.    Electronically signed by: Ernest Mallick, OA 09.29.2020 1:19 PM    Gardiner Sleeper, M.D., Ph.D. Diseases & Surgery of the Retina and Vitreous [facility]  I have reviewed the above documentation for accuracy and completeness, and I agree with the above. Gardiner Sleeper, M.D., Ph.D. 09/26/19 1:19 PM    Abbreviations: M myopia (nearsighted); A astigmatism; H hyperopia (farsighted); P presbyopia; Mrx  spectacle prescription;  CTL contact lenses; OD right eye; OS left eye; OU both eyes  XT exotropia; ET esotropia; PEK punctate epithelial keratitis; PEE punctate epithelial erosions; DES dry eye syndrome; MGD meibomian gland dysfunction; ATs artificial tears; PFAT's preservative free artificial tears; [place] nuclear sclerotic cataract; PSC posterior subcapsular cataract; ERM epi-retinal membrane; PVD posterior vitreous detachment; RD retinal detachment; DM diabetes mellitus; DR diabetic retinopathy; NPDR non-proliferative diabetic retinopathy; PDR proliferative diabetic retinopathy; CSME clinically significant macular edema; DME diabetic macular edema; dbh dot blot hemorrhages; CWS cotton wool spot; POAG primary open angle glaucoma; C/D  cup-to-disc ratio; HVF humphrey visual field; GVF goldmann visual field; OCT optical coherence tomography; IOP intraocular pressure; BRVO Branch retinal vein occlusion; CRVO central retinal vein occlusion; CRAO central retinal artery occlusion; BRAO branch retinal artery occlusion; RT retinal tear; SB scleral buckle; PPV pars plana vitrectomy; VH Vitreous hemorrhage; PRP panretinal laser photocoagulation; IVK intravitreal kenalog; VMT vitreomacular traction; MH Macular hole;  NVD neovascularization of the disc; NVE neovascularization elsewhere; AREDS age related eye disease study; ARMD age related macular degeneration; POAG primary open angle glaucoma; EBMD epithelial/anterior basement membrane dystrophy; ACIOL anterior chamber intraocular lens; IOL intraocular lens; PCIOL posterior chamber intraocular lens; Phaco/IOL phacoemulsification with intraocular lens placement; [place] photorefractive keratectomy; LASIK laser assisted in situ keratomileusis; HTN hypertension; DM diabetes mellitus; COPD chronic obstructive pulmonary disease

## 2019-09-27 ENCOUNTER — Telehealth (INDEPENDENT_AMBULATORY_CARE_PROVIDER_SITE_OTHER): Payer: Self-pay

## 2019-10-05 ENCOUNTER — Ambulatory Visit: Payer: Medicare Other | Admitting: Adult Health

## 2019-10-05 ENCOUNTER — Encounter: Payer: Self-pay | Admitting: Family Medicine

## 2019-10-05 ENCOUNTER — Encounter: Payer: Self-pay | Admitting: Gynecology

## 2019-10-06 ENCOUNTER — Other Ambulatory Visit (INDEPENDENT_AMBULATORY_CARE_PROVIDER_SITE_OTHER): Payer: Self-pay | Admitting: Ophthalmology

## 2019-10-06 MED ORDER — DORZOLAMIDE HCL-TIMOLOL MAL 2-0.5 % OP SOLN: 1.0000 [drp] | Freq: Two times a day (BID) | OPHTHALMIC | 4 refills | Status: DC

## 2019-10-13 ENCOUNTER — Ambulatory Visit: Payer: Medicare Other | Admitting: Family Medicine

## 2019-10-30 ENCOUNTER — Ambulatory Visit: Payer: Medicare Other | Admitting: Family

## 2019-10-31 ENCOUNTER — Encounter (INDEPENDENT_AMBULATORY_CARE_PROVIDER_SITE_OTHER): Payer: Medicare Other | Admitting: Ophthalmology

## 2019-11-06 ENCOUNTER — Ambulatory Visit: Payer: Medicare Other | Admitting: Family

## 2019-11-09 NOTE — Progress Notes (Addendum)
[facility] Note  11/14/2019     CHIEF COMPLAINT Patient presents for Retina Follow Up   HISTORY OF PRESENT ILLNESS: Alejandra Davis is a 83 y.o. female who presents to the clinic today for:   HPI    Retina Follow Up    Patient presents with  CRVO/BRVO.  In right eye.  This started months ago.  Severity is moderate.  Duration of 6 weeks.  Since onset it is stable.  I, the attending physician,  performed the HPI with the patient and updated documentation appropriately.          Comments    83 y/o female pt here for 6 wk f/u for BRVO OD.  Feels VA OU may be slightly improved.  Denies pain, flashes, floaters.  Uses a "blue top" gtt QD OD.  BS unknown.  A1C 6.9.       Last edited by [name], MD on 11/14/2019 10:51 AM. (History)      Lab Results  Component Value Date   HGBA1C 6.9 (H) 03/13/2019  pt is delayed to follow up bc she saw Dr. [name] last week, pts daughter states that Dr. [name] was very pleased with her pressure, pt is currently using Refresh and Cosopt QD OD only, pt has changed PCP, she is now seeing Dr. Sabra Heck, pt has been taken off metformin,   Referring physician: Libby Maw, MD [place],  [place] [id number]  HISTORICAL INFORMATION:   Selected notes from the MEDICAL RECORD NUMBER Referred by [name] for DM exam    CURRENT MEDICATIONS: Current Outpatient Medications (Ophthalmic Drugs)  Medication Sig  . dorzolamide-timolol (COSOPT) 22.3-6.8 MG/ML ophthalmic solution Place 1 drop into the right eye 2 (two) times daily.  . brimonidine (ALPHAGAN) 0.2 % ophthalmic solution Place 1 drop into both eyes 2 (two) times a day. (Patient not taking: Reported on 11/14/2019)  . ketorolac (ACULAR) 0.5 % ophthalmic solution Place 1 drop into the right eye 4 (four) times daily. (Patient not taking: Reported on 11/14/2019)  . prednisoLONE acetate (PRED FORTE) 1 % ophthalmic suspension Place 1 drop into the  right eye 4 (four) times daily. (Patient not taking: Reported on 11/14/2019)   No current facility-administered medications for this visit.  (Ophthalmic Drugs)   Current Outpatient Medications (Other)  Medication Sig  . acetaminophen (TYLENOL) 325 MG tablet Take 325-650 mg by mouth every 6 (six) hours as needed for headache (pain).  Marland Kitchen amLODipine (NORVASC) 10 MG tablet Take 1 tablet (10 mg total) by mouth daily.  . bevacizumab (AVASTIN) 1.25 mg/0.1 mL SOLN 1.25 mg by Intravitreal route every 30 (thirty) days. Administered by Dr. Coralyn Pear  . calcium-vitamin D (OSCAL 500/200 D-3) 500-200 MG-UNIT tablet Take 1 tablet by mouth 2 (two) times daily.  . cephALEXin (KEFLEX) 500 MG capsule Take 1 capsule (500 mg total) by mouth 2 (two) times daily.  . chlorthalidone (HYGROTON) 25 MG tablet TAKE 1/2 TABLET(12.5 MG) BY MOUTH DAILY  . levothyroxine (SYNTHROID) 50 MCG tablet Take 50 mcg by mouth every morning.  Marland Kitchen levothyroxine (SYNTHROID) 75 MCG tablet TAKE 1 TABLET(75 MCG) BY MOUTH DAILY (Patient taking differently: Take 75 mcg by mouth daily. )  . metoprolol succinate (TOPROL-XL) 50 MG 24 hr tablet TAKE 1 TABLET(50 MG) BY MOUTH DAILY  . donepezil (ARICEPT) 10 MG tablet Take 1 tablet (10 mg total) by mouth at bedtime.  . metFORMIN (GLUCOPHAGE-XR) 500 MG 24 hr tablet TAKE 1 TABLET(500  MG) BY MOUTH AT BEDTIME (Patient taking differently: Take 500 mg by mouth daily. )   Current Facility-Administered Medications (Other)  Medication Route  . Bevacizumab (AVASTIN) SOLN 1.25 mg Intravitreal  . Bevacizumab (AVASTIN) SOLN 1.25 mg Intravitreal  . Bevacizumab (AVASTIN) SOLN 1.25 mg Intravitreal  . Bevacizumab (AVASTIN) SOLN 1.25 mg Intravitreal      REVIEW OF SYSTEMS: ROS    Positive for: Skin, Musculoskeletal, Endocrine, Cardiovascular, Eyes   Negative for: Constitutional, Gastrointestinal, Neurological, Genitourinary, HENT, Respiratory, Psychiatric, Allergic/Imm, Heme/Lymph   Last edited by Matthew Folks,  COA on 11/14/2019 10:12 AM. (History)       ALLERGIES No Known Allergies  PAST MEDICAL HISTORY Past Medical History:  Diagnosis Date  . Chicken pox   . Cystocele   . Diabetes mellitus without complication ([place])   . Hypertension   . Hypertensive retinopathy    OU  . Thyroid disease    hypothyroidism   Past Surgical History:  Procedure Laterality Date  . ABDOMINAL HYSTERECTOMY  1990   TAH BSO  . CATARACT EXTRACTION Bilateral    Dr. [name]  . EYE SURGERY Bilateral    Cat Sx OU  . OOPHORECTOMY     BSO  . Vaginal Bx     Papilloma    FAMILY HISTORY Family History  Problem Relation Age of Onset  . Sickle cell anemia Other   . Hypertension Mother   . Cancer Father        LIVER  . Heart disease Brother   . Cancer Brother        STOMACH    SOCIAL HISTORY Social History   Tobacco Use  . Smoking status: Never Smoker  . Smokeless tobacco: Never Used  Substance Use Topics  . Alcohol use: No  . Drug use: No         OPHTHALMIC EXAM:  Base Eye Exam    Visual Acuity (Snellen - Linear)      Right Left   Dist cc 20/40 +2 20/25 -2   Dist ph cc NI NI   Correction: Glasses       Tonometry (Tonopen, 10:16 AM)      Right Left   Pressure 10 12       Pupils      Dark Light Shape React APD   Right 3 2 Round Slow None   Left 3 2 Round Slow None       Visual Fields (Counting fingers)      Left Right    Full Full       Extraocular Movement      Right Left    Full, Ortho Full, Ortho       Neuro/Psych    Oriented x3: Yes   Mood/Affect: Normal       Dilation    Both eyes: 1.0% Mydriacyl, 2.5% Phenylephrine @ 10:17 AM        Slit Lamp and Fundus Exam    Slit Lamp Exam      Right Left   Lids/Lashes Dermatochalasis - upper lid, Meibomian gland dysfunction Dermatochalasis - upper lid, Meibomian gland dysfunction   Conjunctiva/Sclera Melanosis, no injection Melanosis   Cornea Arcus, 1+ Punctate epithelial erosions, mild Anterior basement membrane  dystrophy, Well healed cataract wounds, tr Descemet's folds tempoally Arcus, Inferior 1+ Punctate epithelial erosions; 2-3+ microcystic edema temporal with 2+ descemet folds, Keratic precipitates.  Well healed temporal cat sx incision.   Anterior Chamber Deep, no cell or flare Deep;  no cell or  flare   Iris Round and dilated, No NVI Round and dilated, No NVI   Lens PC IOL in good position, trace Posterior capsular opacification Posterior chamber intraocular lens in good position.  1+ PCO.   Vitreous Vitreous syneresis Vitreous syneresis       Fundus Exam      Right Left   Disc Sharp rim, inferior notch, 2-3+ pallor, inf. Rim thinning, mild disc heme at 11:00, attenuated vessels superiorly, superior hyperemia--improved 1+ pallor   C/D Ratio 0.7 0.6   Macula Flat, good foveal reflex, DBH temporal/superior macula - persistent, Cystic changes--slightly increased Flat, good foveal reflex, RPE mottling, Epiretinal membrane, No heme or edema   Vessels Severe vascular attenuation superiorly, BRVO Mild vascular attenuation, AV crossing changes   Periphery Attached, superior DBH-persistent, but improved Attached, scattered Reticular degeneration          IMAGING AND PROCEDURES  Imaging and Procedures for 03/07/18  OCT, Retina - OU - Both Eyes       Right Eye Quality was good. Central Foveal Thickness: 289. Progression has worsened. Findings include no SRF, epiretinal membrane, normal foveal contour, intraretinal fluid (Blunted foveal depression; mild interval increase in IRF).   Left Eye Quality was good. Central Foveal Thickness: 265. Progression has been stable. Findings include abnormal foveal contour, epiretinal membrane, no IRF, no SRF, vitreomacular adhesion  (Blunted foveal depression).   Notes *Images captured and stored on drive  Diagnosis / Impression:  ERM OU OD: Blunted foveal depression; mild interval increase in IRF  OS: very mild ERM with blunted foveal depression --  stable; mild VMA  Clinical management:  See below  Abbreviations: NFP - Normal foveal profile. CME - cystoid macular edema. PED - pigment epithelial detachment. IRF - intraretinal fluid. SRF - subretinal fluid. EZ - ellipsoid zone. ERM - epiretinal membrane. ORA - outer retinal atrophy. ORT - outer retinal tubulation. SRHM - subretinal hyper-reflective material         Intravitreal Injection, Pharmacologic Agent - OD - Right Eye       Time Out 11/14/2019. 10:16 AM. Confirmed correct patient, procedure, site, and patient consented.   Anesthesia Topical anesthesia was used. Anesthetic medications included Lidocaine 2%, Proparacaine 0.5%.   Procedure Preparation included 5% betadine to ocular surface, eyelid speculum. A 30 gauge needle was used.   Injection:  1.25 mg Bevacizumab (AVASTIN) SOLN   NDC: [id number], Lot: [id number] , Expiration date: 01/02/2020   Route: Intravitreal, Site: Right Eye, Waste: 0 mL  Post-op Post injection exam found visual acuity of at least counting fingers. The patient tolerated the procedure well. There were no complications. The patient received written and verbal post procedure care education.                 ASSESSMENT/PLAN:    ICD-10-CM   1. Branch retinal vein occlusion of right eye with macular edema  H34.8310 Intravitreal Injection, Pharmacologic Agent - OD - Right Eye    Bevacizumab (AVASTIN) SOLN 1.25 mg  2. Diabetes mellitus type 2 without retinopathy ([place])  E11.9   3. Epiretinal membrane (ERM) of both eyes  H35.373   4. Retinal edema  H35.81 OCT, Retina - OU - Both Eyes  5. Pseudophakia of both eyes  Z96.1   6. Ocular hypertension of right eye  H40.051   7. Glaucoma suspect of right eye  H40.001   8. Essential hypertension  I10   9. Hypertensive retinopathy of both eyes  H35.033   10. Headache above the eye region  R51.9     1. BRVO with CME OD  - at presentation, BCVA 20/60 OD -- down from 20/30  - initial OCT w/ CME  superior macula  - s/p IVA OD #1 (10.18.19), #2 (11.19.19), #3 (12.17.19), #4 (02.19.20), #5 (03.23.20), #6 (04.21.20), #7 (05.27.20), #8 (07.07.20), #9 (08.25.20), #10 (9.29.20)  - good response to medications, but held IVA in January due to cataract surgery  - today, BCVA stable at 20/40 today, OCT shows mild interval increase in cystic changes   - recommend IVA OD #11 today, 11.17.20  - pt wishes to proceed  - RBA of procedure discussed, questions answered  - informed consent obtained and signed  - see procedure note  - f/u 5-6 weeks -- DFE/OCT  2. Diabetes mellitus, type 2 without retinopathy  - The incidence, risk factors for progression, natural history and treatment options for diabetic retinopathy  were discussed with patient.    - The need for close monitoring of blood glucose, blood pressure, and serum lipids, avoiding cigarette or any type of tobacco, and the need for long term follow up was also discussed with patient.  3,4. Epiretinal membrane, OU  - relatively mild ERM OU -- blunted central foveal depression  - OD with mild cystic changes 2/2 BRVO as above; OS without cystic changes or edema  - discussed findings and prognosis  - recommend monitoring for now  5. Pseudophakia OU  - s/p CE/IOL OS 11.13.19 w/ Dr. [name]  - s/p CE/IOL OD 01.16.20 w/ Dr. [name]  - beautiful surgeries -- IOLs in perfect position  - healing well post-operatively  - IRF/CME OD may have been partly due to post op CME ([place]) in addition to BRVO  6,7. Ocular hypertension / Glaucoma suspect OD>OS  - IOP ~25 OD, 20 OS at prior clinic visits  - today IOP 10 OD, OS is 12 -- on cosopt BID OU  - denies any family hx of glaucoma  - under the expert care of Dr. [name]  - cont Cosopt BID OD  - pt had appt with Dr. [name] last week  8-10. Hypertensive retinopathy OU w/ symptomatic headache  - pt presented to 6.24.2020 visit urgently for right sided headache above right eye with extension to  occiput  - BP at that time was 201/100 -- pt was sent to primary care clinic for urgent evaluation and meds were adjusted  - BP now under better control and headaches improved  - discussed importance of tight BP control   Ophthalmic Meds Ordered this visit:  Meds ordered this encounter  Medications  . Bevacizumab (AVASTIN) SOLN 1.25 mg       Return for f/u 5-6 weeks BRVO OD, DFE, OCT.  There are no Patient Instructions on file for this visit.   Explained the diagnoses, plan, and follow up with the patient and they expressed understanding.  Patient expressed understanding of the importance of proper follow up care.   This document serves as a record of services personally performed by Gardiner Sleeper, MD, PhD. It was created on their behalf by Estill Bakes, COT an ophthalmic technician. The creation of this record is the provider's dictation and/or activities during the visit.    Electronically signed by: Estill Bakes, COT 11/09/19 @ 12:27 PM  Gardiner Sleeper, M.D., Ph.D. Diseases & Surgery of the Retina and [place] 11/14/2019   I have reviewed the above documentation for accuracy and completeness, and I agree with the above.  Gardiner Sleeper, M.D., Ph.D. 11/14/19 12:27 PM    Abbreviations: M myopia (nearsighted); A astigmatism; H hyperopia (farsighted); P presbyopia; Mrx spectacle prescription;  CTL contact lenses; OD right eye; OS left eye; OU both eyes  XT exotropia; ET esotropia; PEK punctate epithelial keratitis; PEE punctate epithelial erosions; DES dry eye syndrome; MGD meibomian gland dysfunction; ATs artificial tears; PFAT's preservative free artificial tears; [place] nuclear sclerotic cataract; PSC posterior subcapsular cataract; ERM epi-retinal membrane; PVD posterior vitreous detachment; RD retinal detachment; DM diabetes mellitus; DR diabetic retinopathy; NPDR non-proliferative diabetic retinopathy; PDR proliferative diabetic  retinopathy; CSME clinically significant macular edema; DME diabetic macular edema; dbh dot blot hemorrhages; CWS cotton wool spot; POAG primary open angle glaucoma; C/D cup-to-disc ratio; HVF humphrey visual field; GVF goldmann visual field; OCT optical coherence tomography; IOP intraocular pressure; BRVO Branch retinal vein occlusion; CRVO central retinal vein occlusion; CRAO central retinal artery occlusion; BRAO branch retinal artery occlusion; RT retinal tear; SB scleral buckle; PPV pars plana vitrectomy; VH Vitreous hemorrhage; PRP panretinal laser photocoagulation; IVK intravitreal kenalog; VMT vitreomacular traction; MH Macular hole;  NVD neovascularization of the disc; NVE neovascularization elsewhere; AREDS age related eye disease study; ARMD age related macular degeneration; POAG primary open angle glaucoma; EBMD epithelial/anterior basement membrane dystrophy; ACIOL anterior chamber intraocular lens; IOL intraocular lens; PCIOL posterior chamber intraocular lens; Phaco/IOL phacoemulsification with intraocular lens placement; [place] photorefractive keratectomy; LASIK laser assisted in situ keratomileusis; HTN hypertension; DM diabetes mellitus; COPD chronic obstructive pulmonary disease

## 2019-11-14 ENCOUNTER — Encounter (INDEPENDENT_AMBULATORY_CARE_PROVIDER_SITE_OTHER): Payer: Self-pay | Admitting: Ophthalmology

## 2019-11-14 ENCOUNTER — Other Ambulatory Visit: Payer: Self-pay

## 2019-11-14 ENCOUNTER — Ambulatory Visit (INDEPENDENT_AMBULATORY_CARE_PROVIDER_SITE_OTHER): Payer: Medicare Other | Admitting: Ophthalmology

## 2019-11-14 DIAGNOSIS — H35373 Puckering of macula, bilateral: Secondary | ICD-10-CM | POA: Diagnosis not present

## 2019-11-14 DIAGNOSIS — R519 Headache, unspecified: Secondary | ICD-10-CM

## 2019-11-14 DIAGNOSIS — H34831 Tributary (branch) retinal vein occlusion, right eye, with macular edema: Secondary | ICD-10-CM | POA: Diagnosis not present

## 2019-11-14 DIAGNOSIS — Z961 Presence of intraocular lens: Secondary | ICD-10-CM

## 2019-11-14 DIAGNOSIS — H3581 Retinal edema: Secondary | ICD-10-CM

## 2019-11-14 DIAGNOSIS — H35033 Hypertensive retinopathy, bilateral: Secondary | ICD-10-CM

## 2019-11-14 DIAGNOSIS — E119 Type 2 diabetes mellitus without complications: Secondary | ICD-10-CM | POA: Diagnosis not present

## 2019-11-14 DIAGNOSIS — I10 Essential (primary) hypertension: Secondary | ICD-10-CM

## 2019-11-14 DIAGNOSIS — H40051 Ocular hypertension, right eye: Secondary | ICD-10-CM

## 2019-11-14 DIAGNOSIS — H40001 Preglaucoma, unspecified, right eye: Secondary | ICD-10-CM

## 2019-11-14 MED ORDER — BEVACIZUMAB CHEMO INJECTION 1.25MG/0.05ML SYRINGE FOR KALEIDOSCOPE
1.2500 mg | INTRAVITREAL | Status: AC | PRN
  Administered 2019-11-14: 1.25 mg via INTRAVITREAL

## 2019-11-16 ENCOUNTER — Other Ambulatory Visit: Payer: Self-pay

## 2019-11-16 ENCOUNTER — Ambulatory Visit (INDEPENDENT_AMBULATORY_CARE_PROVIDER_SITE_OTHER): Payer: Medicare Other | Admitting: Family

## 2019-11-16 ENCOUNTER — Encounter: Payer: Self-pay | Admitting: Family

## 2019-11-16 VITALS — BP 136/84 | HR 65 | Temp 97.1°F | Ht 63.0 in | Wt 122.4 lb

## 2019-11-16 DIAGNOSIS — I10 Essential (primary) hypertension: Secondary | ICD-10-CM | POA: Diagnosis not present

## 2019-11-16 DIAGNOSIS — E039 Hypothyroidism, unspecified: Secondary | ICD-10-CM | POA: Diagnosis not present

## 2019-11-16 DIAGNOSIS — E119 Type 2 diabetes mellitus without complications: Secondary | ICD-10-CM

## 2019-11-16 DIAGNOSIS — F0390 Unspecified dementia without behavioral disturbance: Secondary | ICD-10-CM | POA: Diagnosis not present

## 2019-11-16 NOTE — Progress Notes (Signed)
Provider: Marlowe Sax FNP-C   Skylier Kretschmer, Nelda Bucks, NP  Patient Care Team: Zanovia Rotz, Nelda Bucks, NP as PCP - General (Family Medicine)  Extended Emergency Contact Information Primary Emergency Contact: Ladouceur,[name] Address: [address]          [place], [place] [address] [place] Phone: [phone] Relation: Daughter Secondary Emergency Contact: Brunet,Mitchell Address: [place]          [place] [address] [place] Mobile Phone: [phone] Relation: Son  Code Status: Full Code Goals of care: Advanced Directive information Advanced Directives 11/16/2019  Does Patient Have a Medical Advance Directive? Yes  Type of Advance Directive [place]  Does patient want to make changes to medical advance directive? No - Patient declined  Copy of [place] in Chart? No - copy requested  Would patient like information on creating a medical advance directive? -     Chief Complaint  Patient presents with  . Establish Care    New patient to establish care     HPI:  Pt is a 83 y.o. female seen today to establish care for medical management of chronic diseases.she is here with her Niece who assists her during the day.family alternates visit.she has a significant medical history of Hypertension,Type 2 DM,Hypothroidism,right eye Glaucoma,Dementia without behavioral issues among other conditions.she denies any acute issues during visit.she states recently had weight loss and her Aricept and metformin was discontinued by her primary care provider.she does not check her blood sugars at home.   Type 2 DM - metformin recently discontinued by PCP due to weight loss.Latest Hgb A1C 6.9 ( 03/13/2019).Niece states lab work was done recently by [facility] not on [employer] system.Will sign release of information to obtain records. She denies any signs of hypo/hyperglycemia. Follow up with ophthalmology but has not seen a  Podiatrist.Not on ASA or Statin.will wait for medical records since labs recent done by PCP. Also due for urine micro albumin.I've discussed with patient's Niece that patient can only have one PCP for insurance coverage.she will discuss this with her daughter.  Hypertension - does not check B/p at home.currently on metoprolol succinate 50 mg tablet daily and Amlodipine 10 mg tablet daily.she denies any dizziness,chest pain,palpitation or shortness of breath.she states has occasional headache but has improved since her eye drops were discontinued by eye doctor.  Hypothyroidism - on levothyroxine 50 mcg tablet daily.she denies any cold or heat intolerance.   Dementia - Aricept recently discontinued by PCP Dr.Miller due to weight loss.Niece reports no behavioral issues but states confused and having problems with her memory.she lives by herself but niece,grandaughter,son and her daughter alternate taking care of her at during the day and at night. No wondering behavior reported.   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  Health maintenance:  Influenza vaccine - niece states had flu shot recently. Due for Tdap,PNA vaccine but will wait for her medical records to up date this on record.      Past Medical History:  Diagnosis Date  . Chicken pox   . Cystocele   . Diabetes mellitus without complication ([place])   . Hypertension   . Hypertensive retinopathy    OU  . Thyroid disease    hypothyroidism   Past Surgical History:  Procedure Laterality Date  . ABDOMINAL HYSTERECTOMY  1990   TAH BSO  . CATARACT EXTRACTION Bilateral    Dr. [name]  . EYE SURGERY Bilateral    Cat Sx OU  . OOPHORECTOMY     BSO  . Vaginal Bx     Papilloma    Allergies  Allergen Reactions  . Donepezil Hcl     Allergies as of 11/16/2019      Reactions    Donepezil Hcl       Medication List       Accurate as of November 16, 2019 11:45 AM. If you have any questions, ask your nurse or doctor.        STOP taking these medications   acetaminophen 325 MG tablet Commonly known as: TYLENOL Stopped by: Nelda Bucks Mailen Newborn, NP   bevacizumab 1.25 mg/0.1 mL Soln Commonly known as: AVASTIN Stopped by: Nelda Bucks Jaymon Dudek, NP   brimonidine 0.2 % ophthalmic solution Commonly known as: ALPHAGAN Stopped by: [name], NP   calcium-vitamin D 500-200 MG-UNIT tablet Commonly known as: Oscal 500/200 D-3 Stopped by: Nelda Bucks Hosie Sharman, NP   cephALEXin 500 MG capsule Commonly known as: Keflex Stopped by: [name], NP   chlorthalidone 25 MG tablet Commonly known as: HYGROTON Stopped by: [name], NP   donepezil 10 MG tablet Commonly known as: ARICEPT Stopped by: [name], NP   ketorolac 0.5 % ophthalmic solution Commonly known as: ACULAR Stopped by: [name], NP   metFORMIN 500 MG 24 hr tablet Commonly known as: GLUCOPHAGE-XR Stopped by: [name], NP   prednisoLONE acetate 1 % ophthalmic suspension Commonly known as: PRED FORTE Stopped by: [name], NP     TAKE these medications   amLODipine 10 MG tablet Commonly known as: NORVASC Take 1 tablet (10 mg total) by mouth daily.   dorzolamide-timolol 22.3-6.8 MG/ML ophthalmic solution Commonly known as: COSOPT Place 1 drop into the right eye 2 (two) times daily.   levothyroxine 50 MCG tablet Commonly known as: SYNTHROID Take 50 mcg by mouth every morning. What changed: Another medication with the same name was removed. Continue taking this medication, and follow the directions you see here. Changed by: [name], NP   metoprolol succinate 50 MG 24 hr tablet Commonly known as: TOPROL-XL TAKE 1 TABLET(50 MG) BY MOUTH DAILY       Review of Systems  Constitutional: Negative for appetite change, chills, fatigue and fever.  HENT:  Positive for hearing loss. Negative for congestion, postnasal drip, rhinorrhea, sinus pressure, sinus pain, sneezing, sore throat, tinnitus and trouble swallowing.        Has hearing aids   Eyes: Positive for visual disturbance. Negative for pain, discharge, redness and itching.       S/p cataract surgery in 2019 follows up with opthalmology retina injection every one month   Respiratory: Negative for cough, chest tightness, shortness of breath and wheezing.   Cardiovascular: Negative for chest pain, palpitations and leg swelling.  Gastrointestinal: Negative for abdominal distention, abdominal pain, constipation, diarrhea, nausea and vomiting.  Endocrine: Negative for cold intolerance, heat intolerance, polydipsia, polyphagia and polyuria.  Genitourinary: Negative for difficulty urinating, dysuria, flank pain, frequency and urgency.  Musculoskeletal: Negative for arthralgias and gait problem.  Skin: Negative for color change, pallor, rash and wound.  Neurological: Negative for dizziness, speech difficulty, weakness, light-headedness and numbness.       Headache sometimes but improved with stopping of eye drops  Hematological: Does not bruise/bleed easily.  Psychiatric/Behavioral: Positive  for confusion. Negative for agitation and sleep disturbance. The patient is not nervous/anxious.     Immunization History  Administered Date(s) Administered  . Influenza, High Dose Seasonal PF 12/20/2017, 09/25/2019  . Influenza-Unspecified 12/20/2017  . Zoster Recombinat (Shingrix) 02/07/2018, 05/18/2018   Pertinent  Health Maintenance Due  Topic Date Due  . FOOT EXAM  10/22/1946  . OPHTHALMOLOGY EXAM  10/22/1946  . PNA vac Low Risk Adult (1 of 2 - PCV13) 10/23/2003  . INFLUENZA VACCINE  07/29/2019  . URINE MICROALBUMIN  09/07/2019  . HEMOGLOBIN A1C  09/13/2019  . DEXA SCAN  Completed   Fall Risk  11/16/2019 11/10/2016  Falls in the past year? 0 No  Number falls in past yr: 0 -  Injury with  Fall? 0 -     Vitals:   11/16/19 1112  BP: 136/84  Pulse: 65  Temp: (!) 97.1 F (36.2 C)  TempSrc: Temporal  SpO2: 97%  Weight: 122 lb 6.4 oz (55.5 kg)  Height: 5\' 3"  (1.6 m)   Body mass index is 21.68 kg/m. Physical Exam Vitals signs reviewed.  Constitutional:      General: She is not in acute distress.    Appearance: She is normal weight. She is not ill-appearing.  HENT:     Head: Normocephalic.     Right Ear: Tympanic membrane, ear canal and external ear normal. There is no impacted cerumen.     Left Ear: Tympanic membrane, ear canal and external ear normal. There is no impacted cerumen.     Nose: Nose normal. No congestion or rhinorrhea.     Mouth/Throat:     Mouth: Mucous membranes are moist.     Pharynx: Oropharynx is clear. No oropharyngeal exudate or posterior oropharyngeal erythema.  Eyes:     General: No scleral icterus.       Right eye: No discharge.        Left eye: No discharge.     Extraocular Movements: Extraocular movements intact.     Conjunctiva/sclera: Conjunctivae normal.     Pupils: Pupils are equal, round, and reactive to light.  Neck:     Musculoskeletal: Normal range of motion. No neck rigidity or muscular tenderness.     Vascular: No carotid bruit.  Cardiovascular:     Rate and Rhythm: Normal rate and regular rhythm.     Pulses: Normal pulses.     Heart sounds: Murmur present. No friction rub. No gallop.   Pulmonary:     Effort: Pulmonary effort is normal. No respiratory distress.     Breath sounds: Normal breath sounds. No wheezing, rhonchi or rales.  Chest:     Chest wall: No tenderness.  Abdominal:     General: Bowel sounds are normal. There is no distension.     Palpations: Abdomen is soft. There is no mass.     Tenderness: There is no abdominal tenderness. There is no right CVA tenderness, left CVA tenderness, guarding or rebound.     Hernia: No hernia is present.  Musculoskeletal: Normal range of motion.        General: No  swelling, tenderness or deformity.     Right lower leg: No edema.     Left lower leg: No edema.  Lymphadenopathy:     Cervical: No cervical adenopathy.  Skin:    General: Skin is warm and dry.     Coloration: Skin is not pale.     Findings: No bruising, erythema or rash.  Neurological:  Mental Status: She is alert.     Cranial Nerves: No cranial nerve deficit.     Sensory: No sensory deficit.     Motor: No weakness.     Coordination: Coordination normal.     Gait: Gait abnormal.     Comments: Alert and oriented to person and place.   Psychiatric:        Mood and Affect: Mood normal.        Speech: Speech normal.        Behavior: Behavior normal.        Thought Content: Thought content normal.        Cognition and Memory: Memory is impaired.        Judgment: Judgment normal.    Labs reviewed: Recent Labs    03/13/19 1345 05/01/19 2212 05/01/19 2231 09/16/19 1134  NA 140 140  --  138  K 5.0 3.8  --  4.2  CL 103 104  --  103  CO2 27 25  --  25  GLUCOSE 138* 115*  --  146*  BUN 18 14  --  13  CREATININE 1.18 1.13* 1.10* 0.94  CALCIUM 10.2 9.9  --  9.6   Recent Labs    03/13/19 1345 05/01/19 2212 09/16/19 1354  AST 18 21 20   ALT 13 17 17   ALKPHOS 71 69 63  BILITOT 0.3 0.6 0.8  PROT 7.9 7.8 7.9  ALBUMIN 4.2 4.0 3.9   Recent Labs    03/13/19 1345 05/01/19 2212 09/16/19 1134  WBC 5.9 5.7 6.9  NEUTROABS  --  3.6  --   HGB 13.7 13.6 13.8  HCT 41.6 42.2 42.0  MCV 81.8 82.9 83.5  PLT 263.0 271 245   Lab Results  Component Value Date   TSH 1.32 03/13/2019   Lab Results  Component Value Date   HGBA1C 6.9 (H) 03/13/2019   Lab Results  Component Value Date   CHOL 180 09/06/2018   HDL 74.60 09/06/2018   LDLCALC 90 09/06/2018   TRIG 76.0 09/06/2018   CHOLHDL 2 09/06/2018    Significant Diagnostic Results in last 30 days:  No results found.  Assessment/Plan 1. Essential hypertension No home B/p readings for review.B/p at goal this visit.continue  on metoprolol succinate 50 mg tablet daily and Amlodipine 10 mg tablet daily.Not on ASA and Statin possible due to her advance age.Had recent labs done by PCP no records for review.form given for release of records but Niece will take for daughter to sign.she does not drink alcohol or smoke.  2. Controlled type 2 diabetes mellitus without complication, without long-term current use of insulin ([place]) Off metformin due to weight loss.Latest Hgb A1C 6.9 ( 3/ 16/2020) though niece states labs recently done by PCP.No records for review.continue to follow up with Ophthalmology.No Retinopathy.continue on dietary and lifestyle modification.  - Ambulatory referral to Podiatry  3. Acquired hypothyroidism No recent TSH level for review.continue on Levothyroxine 50 mcg tablet daily on empty stomach.   4. Dementia without behavioral disturbance, unspecified dementia type ([place]) Having issues with her memory.Lives by herself and does her ADL.Has Family members around the clock for supportive care.off Aricept due to weight loss.    Family/ staff Communication: Reviewed plan of care with patient and Niece.  Labs/tests ordered: Per Niece labs done recently by PCP Dr.Miller no records for review.form given for release of records but Niece will take for daughter to sign.I've discussed with patient and Niece that Insurance covers for only one  PCP.   [name], NP

## 2019-11-27 NOTE — Progress Notes (Addendum)
PATIENT: Alejandra Davis DOB: 05-22-1936  REASON FOR VISIT: follow up HISTORY FROM: patient  Chief Complaint  Patient presents with   Follow-up    Room 5, with niece. Medication questions.     HISTORY OF PRESENT ILLNESS: Today 11/29/19 Alejandra Davis is a 83 y.o. female here today for follow up for memory loss. She has discontinued Aricept.  She continues to have GI distress with weight loss.  She was advised by PCP to discontinue Aricept.  Since, her family feels that she has done much better.  She has gained approximately 9 pounds since last being seen by [place] in September.  She feels that memory is fairly stable.  She continues to live alone.  She is driving short distances.  There have been no concerns of getting lost or accidents.  Her niece and granddaughter check in with her regularly.  She is able to perform all ADLs independently.  She is able to dose her own medications.  She is uncertain if she wishes to pursue a sleep study as advised by Dr. Rexene Alberts in May.  She will continue to discuss this with her family.  She is uncertain if she would use CPAP therapy.  Hearing loss concerns continue.  She is not wearing her hearing aids.  HISTORY: (copied from Dr [name] note on 08/29/2019)  Ms. Hirt is an79 year old right-handed woman with an underlying medical history of diabetes, hypertension, and hypothyroidism, who presents for follow-up consultation of her memory loss.  The patient is accompanied by her niece, Jossie Ng, today.  I first met her on 05/09/2019 at the request of her primary care physician, at which time her daughter reported that patient had memory loss for the past 8 years with gradual progression, more noticeable in the past 2 years. Her MMSE was 20 out of 30 at the time, she had a recent brain MRI and we discussed the findings.  She was advised to proceed with a sleep study.  I also suggested she start low-dose generic Aricept 5 mg daily.  She has had recent labs in March  and May 2020.  She did not have a sleep study.  Today, 08/29/2019: She reports doing okay, does not provide a whole lot of details of her history, her niece reports that there has been loss of weight and loss of appetite.  She seems to tolerate the Aricept 5 mg, she has had the occasional GI complaints including stomach cramping and nausea but no vomiting.  No diarrhea reported.  She does not drink water or any fluids very well.  She decided not to pursue her sleep study.  She is not wearing her hearing aids.  Her family is encouraging its but she has not been wearing them.  The patient's allergies, current medications, family history, past medical history, past social history, past surgical history and problem list were reviewed and updated as appropriate.   Previously:   05/09/2019: (She) reports forgetfulness for the past few years. Her daughter reports that she has had memory loss for the past 8 years, it started gradually, was slowly progressive but more noticeable in the past 2 years. She has not had any major confusion or hallucinations or personality or mood changes. She lives alone, she is divorced, has 1 son who lives close by and one niece who lives close by, daughter lives in [place]. She is a non-smoker and drinks caffeine and limitation, typically 1 cup of coffee in the morning, occasional tea, occasional soda,  typically no milk, some orange juice with breakfast. She does snore, she may have had some breathing pauses while asleep as witnessed by her daughter when they were on vacation once and shared room. Patient denies any significant daytime somnolence. She does not have night to night nocturia, does have recurrent headaches but is not sure if she wakes up with headaches in the middle of the night or first thing in the morning. The first symptoms with regard to her memory have been repeating herself for asking the same questions over again. This was first noticed by her niece  during a family gathering some 8 years ago. She has some family history of memory loss, mom had memory issues in her late years, died at late 63s, father lived also to be in the late 36s, may have had some minor memory issues but patient's daughter does not recall grandfather to have major memory issues, she feels he was quite sharp. She does remember that patient's mom had some memory issues. Patient also reports maternal grandmother had memory loss. Paternal grandmother and paternal aunt may have had some memory issues as well. No actual diagnosis of Alzheimer's disease in the family. She lives alone, she still drives, daughter is somewhat worried about her driving but patient limits her driving typically to local routes and familiar places. She has not fallen recently. She had a prior head CT with and without contrast on 10/31/2003 and the daughter brought a report. 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 I reviewed your office note from September 2019. She has had recent blood work which I reviewed, she had CBC, CMP, INR, ESR on 05/01/2019 with largely unremarkable results. She had a TSH and urinalysis on 03/13/2019, with unremarkable results, A1c in March 2020 is slightly higher than before but still below 7 at 6.9. She had a recent brain MRI without contrast on 05/02/2019 and I reviewed the results: IMPRESSION: 1. No acute intracranial abnormality identified. 2. Moderate chronic microvascular ischemic changes and volume loss of the brain. Small chronic infarct in right hemi pons.  She had a head CT without contrast on 05/01/2019 and I reviewed the results: IMPRESSION: Atrophy, chronic microvascular disease.  No acute intracranial abnormality. Of note, she presented to the emergency room on 05/01/2019 with new onset headache, has had a prior headache similar to the 1. She was treated symptomatically, she had a head CT and brain MRI. She had an elevated blood  pressure.   REVIEW OF SYSTEMS: Out of a complete 14 system review of symptoms, the patient complains only of the following symptoms, memory loss and all other reviewed systems are negative.  ALLERGIES: Allergies  Allergen Reactions   Donepezil Hcl     HOME MEDICATIONS: Outpatient Medications Prior to Visit  Medication Sig Dispense Refill   amLODipine (NORVASC) 10 MG tablet Take 1 tablet (10 mg total) by mouth daily. 90 tablet 1   dorzolamide-timolol (COSOPT) 22.3-6.8 MG/ML ophthalmic solution Place 1 drop into the right eye 2 (two) times daily. 10 mL 4   levothyroxine (SYNTHROID) 50 MCG tablet Take 50 mcg by mouth every morning.     metoprolol succinate (TOPROL-XL) 50 MG 24 hr tablet TAKE 1 TABLET(50 MG) BY MOUTH DAILY 90 tablet 1   Facility-Administered Medications Prior to Visit  Medication Dose Route Frequency Provider Last Rate Last Dose   Bevacizumab (AVASTIN) SOLN 1.25 mg  1.25 mg Intravitreal  [name], MD   1.25 mg at 10/16/18 2335   Bevacizumab (AVASTIN) SOLN  1.25 mg  1.25 mg Intravitreal  [name], MD   1.25 mg at 11/15/18 7322   Bevacizumab (AVASTIN) SOLN 1.25 mg  1.25 mg Intravitreal  [name], MD   1.25 mg at 12/13/18 1542   Bevacizumab (AVASTIN) SOLN 1.25 mg  1.25 mg Intravitreal  [name], MD   1.25 mg at 02/15/19 1725    PAST MEDICAL HISTORY: Past Medical History:  Diagnosis Date   Chicken pox    Cystocele    Diabetes mellitus without complication ([place])    Glaucoma    Hypertension    Hypertensive retinopathy    OU   Neuromuscular disorder ([place])    Dementia    Thyroid disease    hypothyroidism    PAST SURGICAL HISTORY: Past Surgical History:  Procedure Laterality Date   ABDOMINAL HYSTERECTOMY  1990   TAH BSO   CATARACT EXTRACTION Bilateral    Dr. [name]   EYE SURGERY Bilateral    Cat Sx OU   OOPHORECTOMY     BSO   Vaginal Bx     Papilloma    FAMILY HISTORY: Family History  Problem Relation Age of  Onset   Sickle cell anemia Other    Hypertension Mother    Cancer Father        LIVER   Heart disease Brother    Cancer Brother        STOMACH    SOCIAL HISTORY: Social History   Socioeconomic History   Marital status: Divorced    Spouse name: Not on file   Number of children: Not on file   Years of education: Not on file   Highest education level: Not on file  Occupational History   Not on file  Social Needs   Financial resource strain: Not on file   Food insecurity    Worry: Not on file    Inability: Not on file   Transportation needs    Medical: Not on file    Non-medical: Not on file  Tobacco Use   Smoking status: Never Smoker   Smokeless tobacco: Never Used  Substance and Sexual Activity   Alcohol use: No   Drug use: No   Sexual activity: Not on file  Lifestyle   Physical activity    Days per week: Not on file    Minutes per session: Not on file   Stress: Not on file  Relationships   Social connections    Talks on phone: Not on file    Gets together: Not on file    Attends religious service: Not on file    Active member of club or organization: Not on file    Attends meetings of clubs or organizations: Not on file    Relationship status: Not on file   Intimate partner violence    Fear of current or ex partner: Not on file    Emotionally abused: Not on file    Physically abused: Not on file    Forced sexual activity: Not on file  Other Topics Concern   Not on file  Social History Narrative   Social History      Diet? n/a      Do you drink/eat things with caffeine? Yes   Marital status?      Divorced                               What year were you married? [place]  Do you live in a house, apartment, assisted living, condo, trailer, etc.? house      Is it one or more stories? One story      How many persons live in your home? 1      Do you have any pets in your home? (please list) none       Highest level of education  completed? 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               sometimes                       Type & how often? Walking       Advanced Directives      Do you have a living will? No       Do you have a DNR form?                                  If not, do you want to discuss one?  No       Do you have signed POA/HPOA for forms?       Functional Status      Do you have difficulty bathing or dressing yourself? No       Do you have difficulty preparing food or eating? No       Do you have difficulty managing your medications? No       Do you have difficulty managing your finances? No       Do you have difficulty affording your medications? No          PHYSICAL EXAM  Vitals:   11/28/19 1537  BP: 136/78  Pulse: 71  Temp: (!) 97.2 F (36.2 C)  Weight: 124 lb 6.4 oz (56.4 kg)  Height: 5' 3"  (1.6 m)   Body mass index is 22.04 kg/m.  Generalized: Well developed, in no acute distress  Cardiology: normal rate and rhythm, no murmur noted Respiratory: Clear to auscultation bilaterally Neurological examination  Mentation: Alert oriented to time, place, history taking. Follows all commands speech and language fluent Cranial nerve II-XII: Pupils were equal round reactive to light. Extraocular movements were full, visual field were full on confrontational test.  Motor: The motor testing reveals 5 over 5 strength of all 4 extremities. Good symmetric motor tone is noted throughout.  Gait and station: Gait is normal.   DIAGNOSTIC DATA (LABS, IMAGING, TESTING) - I reviewed patient records, labs, notes, testing and imaging myself where available.  MMSE - Mini Mental State Exam 11/28/2019 05/09/2019  Orientation to time 2 3  Orientation to Place 4 4  Registration 3 3  Attention/ Calculation 5 2  Recall 0 0  Language- name 2 objects 2 2   Language- repeat 1 1  Language- follow 3 step command 3 3  Language- read & follow direction 1 1  Write a sentence 1 1  Copy design 1 0  Copy design-comments named 6 animals -  Total score 23 20     Lab Results  Component Value Date   WBC 6.9 09/16/2019   HGB 13.8 09/16/2019   HCT 42.0 09/16/2019   MCV 83.5 09/16/2019   PLT 245 09/16/2019      Component Value Date/Time  NA 138 09/16/2019 1134   K 4.2 09/16/2019 1134   CL 103 09/16/2019 1134   CO2 25 09/16/2019 1134   GLUCOSE 146 (H) 09/16/2019 1134   BUN 13 09/16/2019 1134   CREATININE 0.94 09/16/2019 1134   CALCIUM 9.6 09/16/2019 1134   PROT 7.9 09/16/2019 1354   ALBUMIN 3.9 09/16/2019 1354   AST 20 09/16/2019 1354   ALT 17 09/16/2019 1354   ALKPHOS 63 09/16/2019 1354   BILITOT 0.8 09/16/2019 1354   GFRNONAA 57 (L) 09/16/2019 1134   GFRAA >60 09/16/2019 1134   Lab Results  Component Value Date   CHOL 180 09/06/2018   HDL 74.60 09/06/2018   LDLCALC 90 09/06/2018   TRIG 76.0 09/06/2018   CHOLHDL 2 09/06/2018   Lab Results  Component Value Date   HGBA1C 6.9 (H) 03/13/2019   No results found for: VITAMINB12 Lab Results  Component Value Date   TSH 1.32 03/13/2019       ASSESSMENT AND PLAN 83 y.o. year old female  has a past medical history of Chicken pox, Cystocele, Diabetes mellitus without complication ([place]), Glaucoma, Hypertension, Hypertensive retinopathy, Neuromuscular disorder ([place]), and Thyroid disease. here with     ICD-10-CM   1. Memory loss  R41.3     Ms. Lamy is doing fairly well today.  GI distress and concerns of weight loss have resolved since discontinuing Aricept.  She has gained 9 pounds since September.  Family feels that memory is fairly stable.  MMSE today is 23/30, previously 20/30.  We have discussed considering Namenda.  Family is hesitant at this time due to concerns of side effects.  I have provided additional information in her AVS for family to discuss.  I have advised memory  compensation strategies, regular physical and mental exercise.  I have advised caution with driving.  We have discussed returning for sleep evaluation as recommended by Dr. Rexene Alberts.  Risk of untreated sleep apnea were reviewed.  Additional information provided in AVS.  She will call us should she change her mind.  Hearing aids recommended.  Continued follow-up with primary care advised.  She will follow-up with [place] in 3 to 6 months, sooner if needed.  She verbalizes understanding and agreement with this plan.   No orders of the defined types were placed in this encounter.    No orders of the defined types were placed in this encounter.     I spent 20 minutes with the patient. 50% of this time was spent counseling and educating patient on plan of care and medications.    Debbora Presto, FNP-C 11/29/2019, 9:56 AM [facility] [address] [place], [place] [address] [phone]  I reviewed the above note and documentation by the Nurse Practitioner and agree with the history, exam, assessment and plan as outlined above. I was available for consultation. Star Age, MD, PhD [facility])

## 2019-11-28 ENCOUNTER — Encounter: Payer: Self-pay | Admitting: Family Medicine

## 2019-11-28 ENCOUNTER — Other Ambulatory Visit: Payer: Self-pay

## 2019-11-28 ENCOUNTER — Ambulatory Visit: Payer: Medicare Other | Admitting: Family Medicine

## 2019-11-28 VITALS — BP 136/78 | HR 71 | Temp 97.2°F | Ht 63.0 in | Wt 124.4 lb

## 2019-11-28 DIAGNOSIS — R413 Other amnesia: Secondary | ICD-10-CM | POA: Diagnosis not present

## 2019-11-28 NOTE — Patient Instructions (Signed)
We will discontinue Aricept.   Consider sleep study for evaluation, consider Namenda for memory, consider working with PCP for hearing loss and anxiety. I would suggest wearing hearing aids if you are willing.    Follow up in 3-6 months     Memory Compensation Strategies  1. Use "WARM" strategy.  W= write it down  A= associate it  R= repeat it  M= make a mental note  2.   You can keep a Social worker.  Use a 3-ring notebook with sections for the following: calendar, important names and phone numbers,  medications, doctors' names/phone numbers, lists/reminders, and a section to journal what you did  each day.   3.    Use a calendar to write appointments down.  4.    Write yourself a schedule for the day.  This can be placed on the calendar or in a separate section of the Memory Notebook.  Keeping a  regular schedule can help memory.  5.    Use medication organizer with sections for each day or morning/evening pills.  You may need help loading it  6.    Keep a basket, or pegboard by the door.  Place items that you need to take out with you in the basket or on the pegboard.  You may also want to  include a message board for reminders.  7.    Use sticky notes.  Place sticky notes with reminders in a place where the task is performed.  For example: " turn off the  stove" placed by the stove, "lock the door" placed on the door at eye level, " take your medications" on  the bathroom mirror or by the place where you normally take your medications.  8.    Use alarms/timers.  Use while cooking to remind yourself to check on food or as a reminder to take your medicine, or as a  reminder to make a call, or as a reminder to perform another task, etc.   Memantine Tablets What is this medicine? MEMANTINE (MEM an teen) is used to treat dementia caused by Alzheimer's disease. This medicine may be used for other purposes; ask your health care provider or pharmacist if you have questions.  COMMON BRAND NAME(S): Namenda What should I tell my health care provider before I take this medicine? They need to know if you have any of these conditions:  difficulty passing urine  kidney disease  liver disease  seizures  an unusual or allergic reaction to memantine, other medicines, foods, dyes, or preservatives  pregnant or trying to get pregnant  breast-feeding How should I use this medicine? Take this medicine by mouth with a glass of water. Follow the directions on the prescription label. You may take this medicine with or without food. Take your doses at regular intervals. Do not take your medicine more often than directed. Continue to take your medicine even if you feel better. Do not stop taking except on the advice of your doctor or health care professional. Talk to your pediatrician regarding the use of this medicine in children. Special care may be needed. Overdosage: If you think you have taken too much of this medicine contact a poison control center or emergency room at once. NOTE: This medicine is only for you. Do not share this medicine with others. What if I miss a dose? If you miss a dose, take it as soon as you can. If it is almost time for your next dose, take only  that dose. Do not take double or extra doses. If you do not take your medicine for several days, contact your health care provider. Your dose may need to be changed. What may interact with this medicine?  acetazolamide  amantadine  cimetidine  dextromethorphan  dofetilide  hydrochlorothiazide  ketamine  metformin  methazolamide  quinidine  ranitidine  sodium bicarbonate  triamterene This list may not describe all possible interactions. Give your health care provider a list of all the medicines, herbs, non-prescription drugs, or dietary supplements you use. Also tell them if you smoke, drink alcohol, or use illegal drugs. Some items may interact with your medicine. What should I  watch for while using this medicine? Visit your doctor or health care professional for regular checks on your progress. Check with your doctor or health care professional if there is no improvement in your symptoms or if they get worse. You may get drowsy or dizzy. Do not drive, use machinery, or do anything that needs mental alertness until you know how this drug affects you. Do not stand or sit up quickly, especially if you are an older patient. This reduces the risk of dizzy or fainting spells. Alcohol can make you more drowsy and dizzy. Avoid alcoholic drinks. What side effects may I notice from receiving this medicine? Side effects that you should report to your doctor or health care professional as soon as possible:  allergic reactions like skin rash, itching or hives, swelling of the face, lips, or tongue  agitation or a feeling of restlessness  depressed mood  dizziness  hallucinations  redness, blistering, peeling or loosening of the skin, including inside the mouth  seizures  vomiting Side effects that usually do not require medical attention (report to your doctor or health care professional if they continue or are bothersome):  constipation  diarrhea  headache  nausea  trouble sleeping This list may not describe all possible side effects. Call your doctor for medical advice about side effects. You may report side effects to FDA at 1-800-FDA-1088. Where should I keep my medicine? Keep out of the reach of children. Store at room temperature between 15 degrees and 30 degrees C (59 degrees and 86 degrees F). Throw away any unused medicine after the expiration date. NOTE: This sheet is a summary. It may not cover all possible information. If you have questions about this medicine, talk to your doctor, pharmacist, or health care provider.  2020 Elsevier/Gold Standard (2013-10-02 14:10:42)   Dementia Caregiver Guide Dementia is a term used to describe a number of  symptoms that affect memory and thinking. The most common symptoms include:  Memory loss.  Trouble with language and communication.  Trouble concentrating.  Poor judgment.  Problems with reasoning.  Child-like behavior and language.  Extreme anxiety.  Angry outbursts.  Wandering from home or public places. Dementia usually gets worse slowly over time. In the early stages, people with dementia can stay independent and safe with some help. In later stages, they need help with daily tasks such as dressing, grooming, and using the bathroom. How to help the person with dementia cope Dementia can be frightening and confusing. Here are some tips to help the person with dementia cope with changes caused by the disease. General tips  Keep the person on track with his or her routine.  Try to identify areas where the person may need help.  Be supportive, patient, calm, and encouraging.  Gently remind the person that adjusting to changes takes time.  Help with the tasks that the person has asked for help with.  Keep the person involved in daily tasks and decisions as much as possible.  Encourage conversation, but try not to get frustrated or harried if the person struggles to find words or does not seem to appreciate your help. Communication tips  When the person is talking or seems frustrated, make eye contact and hold the person's hand.  Ask specific questions that need yes or no answers.  Use simple words, short sentences, and a calm voice. Only give one direction at a time.  When offering choices, limit them to just 1 or 2.  Avoid correcting the person in a negative way.  If the person is struggling to find the right words, gently try to help him or her. How to recognize symptoms of stress Symptoms of stress in caregivers include:  Feeling frustrated or angry with the person with dementia.  Denying that the person has dementia or that his or her symptoms will not  improve.  Feeling hopeless and unappreciated.  Difficulty sleeping.  Difficulty concentrating.  Feeling anxious, irritable, or depressed.  Developing stress-related health problems.  Feeling like you have too little time for your own life. Follow these instructions at home:   Make sure that you and the person you are caring for: ? Get regular sleep. ? Exercise regularly. ? Eat regular, nutritious meals. ? Drink enough fluid to keep your urine clear or pale yellow. ? Take over-the-counter and prescription medicines only as told by your health care providers. ? Attend all scheduled health care appointments.  Join a support group with others who are caregivers.  Ask about respite care resources so that you can have a regular break from the stress of caregiving.  Look for signs of stress in yourself and in the person you are caring for. If you notice signs of stress, take steps to manage it.  Consider any safety risks and take steps to avoid them.  Organize medications in a pill box for each day of the week.  Create a plan to handle any legal or financial matters. Get legal or financial advice if needed.  Keep a calendar in a central location to remind the person of appointments or other activities. Tips for reducing the risk of injury  Keep floors clear of clutter. Remove rugs, magazine racks, and floor lamps.  Keep hallways well lit, especially at night.  Put a handrail and nonslip mat in the bathtub or shower.  Put childproof locks on cabinets that contain dangerous items, such as medicines, alcohol, guns, toxic cleaning items, sharp tools or utensils, matches, and lighters.  Put the locks in places where the person cannot see or reach them easily. This will help ensure that the person does not wander out of the house and get lost.  Be prepared for emergencies. Keep a list of emergency phone numbers and addresses in a convenient area.  Remove car keys and lock garage  doors so that the person does not try to get in the car and drive.  Have the person wear a bracelet that tracks locations and identifies the person as having memory problems. This should be worn at all times for safety. Where to find support: Many individuals and organizations offer support. These include:  Support groups for people with dementia and for caregivers.  Counselors or therapists.  Home health care services.  Adult day care centers. Where to find more information Alzheimer[employer]: [employer] Contact a health care  provider if:  The person's health is rapidly getting worse.  You are no longer able to care for the person.  Caring for the person is affecting your physical and emotional health.  The person threatens himself or herself, you, or anyone else. Summary  Dementia is a term used to describe a number of symptoms that affect memory and thinking.  Dementia usually gets worse slowly over time.  Take steps to reduce the person's risk of injury, and to plan for future care.  Caregivers need support, relief from caregiving, and time for their own lives. This information is not intended to replace advice given to you by your health care provider. Make sure you discuss any questions you have with your health care provider. Document Released: 11/17/2016 Document Revised: 11/26/2017 Document Reviewed: 11/17/2016 Elsevier Patient Education  [place].   Sleep Apnea Sleep apnea affects breathing during sleep. It causes breathing to stop for a short time or to become shallow. It can also increase the risk of:  Heart attack.  Stroke.  Being very overweight (obese).  Diabetes.  Heart failure.  Irregular heartbeat. The goal of treatment is to help you breathe normally again. What are the causes? There are three kinds of sleep apnea:  Obstructive sleep apnea. This is caused by a blocked or collapsed airway.  Central sleep apnea. This happens  when the brain does not send the right signals to the muscles that control breathing.  Mixed sleep apnea. This is a combination of obstructive and central sleep apnea. The most common cause of this condition is a collapsed or blocked airway. This can happen if:  Your throat muscles are too relaxed.  Your tongue and tonsils are too large.  You are overweight.  Your airway is too small. What increases the risk?  Being overweight.  Smoking.  Having a small airway.  Being older.  Being female.  Drinking alcohol.  Taking medicines to calm yourself (sedatives or tranquilizers).  Having family members with the condition. What are the signs or symptoms?  Trouble staying asleep.  Being sleepy or tired during the day.  Getting angry a lot.  Loud snoring.  Headaches in the morning.  Not being able to focus your mind (concentrate).  Forgetting things.  Less interest in sex.  Mood swings.  Personality changes.  Feelings of sadness (depression).  Waking up a lot during the night to pee (urinate).  Dry mouth.  Sore throat. How is this diagnosed?  Your medical history.  A physical exam.  A test that is done when you are sleeping (sleep study). The test is most often done in a sleep lab but may also be done at home. How is this treated?   Sleeping on your side.  Using a medicine to get rid of mucus in your nose (decongestant).  Avoiding the use of alcohol, medicines to help you relax, or certain pain medicines (narcotics).  Losing weight, if needed.  Changing your diet.  Not smoking.  Using a machine to open your airway while you sleep, such as: ? An oral appliance. This is a mouthpiece that shifts your lower jaw forward. ? A CPAP device. This device blows air through a mask when you breathe out (exhale). ? An EPAP device. This has valves that you put in each nostril. ? A BPAP device. This device blows air through a mask when you breathe in (inhale)  and breathe out.  Having surgery if other treatments do not work. It is important  to get treatment for sleep apnea. Without treatment, it can lead to:  High blood pressure.  Coronary artery disease.  In men, not being able to have an erection (impotence).  Reduced thinking ability. Follow these instructions at home: Lifestyle  Make changes that your doctor recommends.  Eat a healthy diet.  Lose weight if needed.  Avoid alcohol, medicines to help you relax, and some pain medicines.  Do not use any products that contain nicotine or tobacco, such as cigarettes, e-cigarettes, and chewing tobacco. If you need help quitting, ask your doctor. General instructions  Take over-the-counter and prescription medicines only as told by your doctor.  If you were given a machine to use while you sleep, use it only as told by your doctor.  If you are having surgery, make sure to tell your doctor you have sleep apnea. You may need to bring your device with you.  Keep all follow-up visits as told by your doctor. This is important. Contact a doctor if:  The machine that you were given to use during sleep bothers you or does not seem to be working.  You do not get better.  You get worse. Get help right away if:  Your chest hurts.  You have trouble breathing in enough air.  You have an uncomfortable feeling in your back, arms, or stomach.  You have trouble talking.  One side of your body feels weak.  A part of your face is hanging down. These symptoms may be an emergency. Do not wait to see if the symptoms will go away. Get medical help right away. Call your local emergency services (911 in the [place] Do not drive yourself to the hospital. Summary  This condition affects breathing during sleep.  The most common cause is a collapsed or blocked airway.  The goal of treatment is to help you breathe normally while you sleep. This information is not intended to replace advice given  to you by your health care provider. Make sure you discuss any questions you have with your health care provider. Document Released: 09/22/2008 Document Revised: 09/30/2018 Document Reviewed: 08/09/2018 Elsevier Patient Education  2020 [employer].

## 2019-11-29 ENCOUNTER — Encounter: Payer: Self-pay | Admitting: Family Medicine

## 2019-12-07 ENCOUNTER — Other Ambulatory Visit: Payer: Self-pay | Admitting: Family Medicine

## 2019-12-07 DIAGNOSIS — E079 Disorder of thyroid, unspecified: Secondary | ICD-10-CM

## 2019-12-18 NOTE — Progress Notes (Signed)
[facility] Note  12/19/2019     CHIEF COMPLAINT Patient presents for Retina Follow Up   HISTORY OF PRESENT ILLNESS: Alejandra Davis is a 83 y.o. female who presents to the clinic today for:   HPI    Retina Follow Up    Patient presents with  CRVO/BRVO.  In right eye.  This started months ago.  Severity is moderate.  Duration of 6 weeks.  Since onset it is stable.  I, the attending physician,  performed the HPI with the patient and updated documentation appropriately.          Comments    83 y/o female pt here for 6 wk f/u for BRVO right eye.  No change in [place] in either eye.  Denies pain, flashes, floaters.  Cosopt BID right eye.  BS and A1C unknown.       Last edited by [name], MD on 12/20/2019  4:45 PM. (History)      Lab Results  Component Value Date   HGBA1C 6.9 (H) 03/13/2019  pt is doing well today  Referring physician: Libby Maw, MD [place],  [place] [id number]  HISTORICAL INFORMATION:   Selected notes from the MEDICAL RECORD NUMBER Referred by [name] for DM exam    CURRENT MEDICATIONS: Current Outpatient Medications (Ophthalmic Drugs)  Medication Sig  . dorzolamide-timolol (COSOPT) 22.3-6.8 MG/ML ophthalmic solution Place 1 drop into the right eye 2 (two) times daily.   No current facility-administered medications for this visit. (Ophthalmic Drugs)   Current Outpatient Medications (Other)  Medication Sig  . amLODipine (NORVASC) 10 MG tablet Take 1 tablet (10 mg total) by mouth daily.  Marland Kitchen levothyroxine (SYNTHROID) 50 MCG tablet Take 50 mcg by mouth every morning.  . metoprolol succinate (TOPROL-XL) 50 MG 24 hr tablet TAKE 1 TABLET(50 MG) BY MOUTH DAILY   Current Facility-Administered Medications (Other)  Medication Route  . Bevacizumab (AVASTIN) SOLN 1.25 mg Intravitreal  . Bevacizumab (AVASTIN) SOLN 1.25 mg Intravitreal  . Bevacizumab (AVASTIN) SOLN 1.25 mg Intravitreal  .  Bevacizumab (AVASTIN) SOLN 1.25 mg Intravitreal      REVIEW OF SYSTEMS: ROS    Positive for: Skin, Musculoskeletal, Endocrine, Eyes   Negative for: Constitutional, Gastrointestinal, Neurological, Genitourinary, HENT, Cardiovascular, Respiratory, Psychiatric, Allergic/Imm, Heme/Lymph   Last edited by Matthew Folks, COA on 12/19/2019 10:18 AM. (History)       ALLERGIES Allergies  Allergen Reactions  . Donepezil Hcl     PAST MEDICAL HISTORY Past Medical History:  Diagnosis Date  . Chicken pox   . Cystocele   . Diabetes mellitus without complication ([place])   . Glaucoma   . Hypertension   . Hypertensive retinopathy    OU  . Neuromuscular disorder ([place])    Dementia   . Thyroid disease    hypothyroidism   Past Surgical History:  Procedure Laterality Date  . ABDOMINAL HYSTERECTOMY  1990   TAH BSO  . CATARACT EXTRACTION Bilateral    Dr. [name]  . EYE SURGERY Bilateral    Cat Sx OU  . OOPHORECTOMY     BSO  . Vaginal Bx     Papilloma    FAMILY HISTORY Family History  Problem Relation Age of Onset  . Sickle cell anemia Other   . Hypertension Mother   . Cancer Father        LIVER  . Heart disease Brother   . Cancer Brother  STOMACH    SOCIAL HISTORY Social History   Tobacco Use  . Smoking status: Never Smoker  . Smokeless tobacco: Never Used  Substance Use Topics  . Alcohol use: No  . Drug use: No         OPHTHALMIC EXAM:  Base Eye Exam    Visual Acuity (Snellen - Linear)      Right Left   Dist cc 20/40 20/40   Dist ph cc NI 20/25 -2   Correction: Glasses       Tonometry (Tonopen, 10:20 AM)      Right Left   Pressure 14 12       Pupils      Dark Light Shape React APD   Right 3 2 Round Slow None   Left 3 2 Round Slow None       Visual Fields (Counting fingers)      Left Right    Full Full       Extraocular Movement      Right Left    Full, Ortho Full, Ortho       Neuro/Psych    Oriented x3: Yes   Mood/Affect: Normal        Dilation    Both eyes: 1.0% Mydriacyl, 2.5% Phenylephrine @ 10:20 AM        Slit Lamp and Fundus Exam    Slit Lamp Exam      Right Left   Lids/Lashes Dermatochalasis - upper lid, Meibomian gland dysfunction Dermatochalasis - upper lid, Meibomian gland dysfunction   Conjunctiva/Sclera Melanosis, no injection Melanosis   Cornea Arcus, 1+ Punctate epithelial erosions, mild Anterior basement membrane dystrophy, Well healed cataract wounds, tr Descemet's folds tempoally Arcus, Inferior 1+ Punctate epithelial erosions; 2-3+ microcystic edema temporal with 2+ descemet folds, Keratic precipitates.  Well healed temporal cat sx incision.   Anterior Chamber Deep, no cell or flare Deep;  no cell or flare   Iris Round and dilated, No NVI Round and dilated, No NVI   Lens PC IOL in good position, trace Posterior capsular opacification Posterior chamber intraocular lens in good position.  1+ PCO.   Vitreous Vitreous syneresis Vitreous syneresis       Fundus Exam      Right Left   Disc Sharp rim, inferior notch, 2-3+ pallor, inf. Rim thinning, mild disc heme at 11:00 (fading), attenuated vessels superiorly, superior hyperemia--improved 1+ pallor   C/D Ratio 0.7 0.6   Macula Flat, good foveal reflex, DBH temporal macula - persistent, Cystic changes--slightly improved Flat, good foveal reflex, RPE mottling, Epiretinal membrane, No heme or edema   Vessels Severe vascular attenuation superiorly, BRVO Mild vascular attenuation, AV crossing changes   Periphery Attached, scattered superior DBH-persistent Attached, scattered Reticular degeneration          IMAGING AND PROCEDURES  Imaging and Procedures for 03/07/18  OCT, Retina - OU - Both Eyes       Right Eye Quality was good. Central Foveal Thickness: 266. Progression has improved. Findings include no SRF, epiretinal membrane, normal foveal contour, intraretinal fluid (Blunted foveal depression; mild interval improvement in IRF).   Left  Eye Quality was good. Central Foveal Thickness: 259. Progression has been stable. Findings include abnormal foveal contour, epiretinal membrane, no IRF, no SRF, vitreomacular adhesion  (Blunted foveal depression).   Notes *Images captured and stored on drive  Diagnosis / Impression:  ERM OU OD: Blunted foveal depression; mild interval improvement in IRF  OS: very mild ERM with blunted foveal depression --  stable; mild VMA  Clinical management:  See below  Abbreviations: NFP - Normal foveal profile. CME - cystoid macular edema. PED - pigment epithelial detachment. IRF - intraretinal fluid. SRF - subretinal fluid. EZ - ellipsoid zone. ERM - epiretinal membrane. ORA - outer retinal atrophy. ORT - outer retinal tubulation. SRHM - subretinal hyper-reflective material         Intravitreal Injection, Pharmacologic Agent - OD - Right Eye       Time Out 12/19/2019. 10:00 AM. Confirmed correct patient, procedure, site, and patient consented.   Anesthesia Topical anesthesia was used. Anesthetic medications included Lidocaine 2%, Proparacaine 0.5%.   Procedure Preparation included 5% betadine to ocular surface, eyelid speculum. A supplied needle was used.   Injection:  1.25 mg Bevacizumab (AVASTIN) SOLN   NDC: [id number], Lot: [id number] , Expiration date: 01/31/2020   Route: Intravitreal, Site: Right Eye, Waste: 0 mL  Post-op Post injection exam found visual acuity of at least counting fingers. The patient tolerated the procedure well. There were no complications. The patient received written and verbal post procedure care education.                 ASSESSMENT/PLAN:    ICD-10-CM   1. Branch retinal vein occlusion of right eye with macular edema  H34.8310 Intravitreal Injection, Pharmacologic Agent - OD - Right Eye    Bevacizumab (AVASTIN) SOLN 1.25 mg  2. Diabetes mellitus type 2 without retinopathy ([place])  E11.9   3. Epiretinal membrane (ERM) of both eyes  H35.373   4.  Retinal edema  H35.81 OCT, Retina - OU - Both Eyes  5. Pseudophakia of both eyes  Z96.1   6. Ocular hypertension of right eye  H40.051   7. Glaucoma suspect of right eye  H40.001   8. Essential hypertension  I10   9. Hypertensive retinopathy of both eyes  H35.033   10. Headache above the eye region  R51.9     1. BRVO with CME OD  - at presentation, BCVA 20/60 OD -- down from 20/30  - initial OCT w/ CME superior macula  - s/p IVA OD #1 (10.18.19), #2 (11.19.19), #3 (12.17.19), #4 (02.19.20), #5 (03.23.20), #6 (04.21.20), #7 (05.27.20), #8 (07.07.20), #9 (08.25.20), #10 (9.29.20), #11 (11.17.20)  - good response to medications, but held IVA in January due to cataract surgery  - today, BCVA stable at 20/40 today, OCT shows mild interval improvement in cystic changes   - recommend IVA OD #12 today, 12.22.20  - pt wishes to proceed  - RBA of procedure discussed, questions answered  - informed consent obtained and signed  - see procedure note  - history of interval increase in IRF/cystic changes at 6+ weeks interval   - f/u 5 weeks -- DFE/OCT  2. Diabetes mellitus, type 2 without retinopathy  - The incidence, risk factors for progression, natural history and treatment options for diabetic retinopathy  were discussed with patient.    - The need for close monitoring of blood glucose, blood pressure, and serum lipids, avoiding cigarette or any type of tobacco, and the need for long term follow up was also discussed with patient.  3,4. Epiretinal membrane, OU  - relatively mild ERM OU -- blunted central foveal depression  - OD with mild cystic changes 2/2 BRVO as above; OS without cystic changes or edema  - discussed findings and prognosis  - recommend monitoring for now  5. Pseudophakia OU  - s/p CE/IOL OS 11.13.19 w/ Dr. [name]  - s/p CE/IOL  OD 01.16.20 w/ Dr. [name]  - beautiful surgeries -- IOLs in perfect position  - healing well post-operatively  - IRF/CME OD may have been partly  due to post op CME ([place]) in addition to BRVO  6,7. Ocular hypertension / Glaucoma suspect OD>OS  - IOP ~25 OD, 20 OS at prior clinic visits  - today IOP 14 OD, OS is 12 -- on cosopt BID OD  - denies any family hx of glaucoma  - under the expert care of Dr. [name]  - cont Cosopt BID OD  8-10. Hypertensive retinopathy OU w/ symptomatic headache  - pt presented to 6.24.2020 visit urgently for right sided headache above right eye with extension to occiput  - BP at that time was 201/100 -- pt was sent to primary care clinic for urgent evaluation and meds were adjusted  - BP now under better control and headaches improved  - discussed importance of tight BP control   Ophthalmic Meds Ordered this visit:  Meds ordered this encounter  Medications  . Bevacizumab (AVASTIN) SOLN 1.25 mg       Return in about 5 weeks (around 01/23/2020) for f/u BRVO OD, DFE, OCT.  There are no Patient Instructions on file for this visit.   Explained the diagnoses, plan, and follow up with the patient and they expressed understanding.  Patient expressed understanding of the importance of proper follow up care.   This document serves as a record of services personally performed by Gardiner Sleeper, MD, PhD. It was created on their behalf by Estill Bakes, COT an ophthalmic technician. The creation of this record is the provider's dictation and/or activities during the visit.    Electronically signed by: Estill Bakes, COT 12/18/19 @ 4:48 PM  Gardiner Sleeper, M.D., Ph.D. Diseases & Surgery of the Retina and [place] 12/19/2019   I have reviewed the above documentation for accuracy and completeness, and I agree with the above. Gardiner Sleeper, M.D., Ph.D. 12/20/19 4:48 PM    Abbreviations: M myopia (nearsighted); A astigmatism; H hyperopia (farsighted); P presbyopia; Mrx spectacle prescription;  CTL contact lenses; OD right eye; OS left eye; OU both eyes  XT  exotropia; ET esotropia; PEK punctate epithelial keratitis; PEE punctate epithelial erosions; DES dry eye syndrome; MGD meibomian gland dysfunction; ATs artificial tears; PFAT's preservative free artificial tears; [place] nuclear sclerotic cataract; PSC posterior subcapsular cataract; ERM epi-retinal membrane; PVD posterior vitreous detachment; RD retinal detachment; DM diabetes mellitus; DR diabetic retinopathy; NPDR non-proliferative diabetic retinopathy; PDR proliferative diabetic retinopathy; CSME clinically significant macular edema; DME diabetic macular edema; dbh dot blot hemorrhages; CWS cotton wool spot; POAG primary open angle glaucoma; C/D cup-to-disc ratio; HVF humphrey visual field; GVF goldmann visual field; OCT optical coherence tomography; IOP intraocular pressure; BRVO Branch retinal vein occlusion; CRVO central retinal vein occlusion; CRAO central retinal artery occlusion; BRAO branch retinal artery occlusion; RT retinal tear; SB scleral buckle; PPV pars plana vitrectomy; VH Vitreous hemorrhage; PRP panretinal laser photocoagulation; IVK intravitreal kenalog; VMT vitreomacular traction; MH Macular hole;  NVD neovascularization of the disc; NVE neovascularization elsewhere; AREDS age related eye disease study; ARMD age related macular degeneration; POAG primary open angle glaucoma; EBMD epithelial/anterior basement membrane dystrophy; ACIOL anterior chamber intraocular lens; IOL intraocular lens; PCIOL posterior chamber intraocular lens; Phaco/IOL phacoemulsification with intraocular lens placement; [place] photorefractive keratectomy; LASIK laser assisted in situ keratomileusis; HTN hypertension; DM diabetes mellitus; COPD chronic obstructive pulmonary disease

## 2019-12-19 ENCOUNTER — Encounter (INDEPENDENT_AMBULATORY_CARE_PROVIDER_SITE_OTHER): Payer: Self-pay | Admitting: Ophthalmology

## 2019-12-19 ENCOUNTER — Other Ambulatory Visit: Payer: Self-pay

## 2019-12-19 ENCOUNTER — Ambulatory Visit (INDEPENDENT_AMBULATORY_CARE_PROVIDER_SITE_OTHER): Payer: Medicare Other | Admitting: Ophthalmology

## 2019-12-19 DIAGNOSIS — H35033 Hypertensive retinopathy, bilateral: Secondary | ICD-10-CM

## 2019-12-19 DIAGNOSIS — E119 Type 2 diabetes mellitus without complications: Secondary | ICD-10-CM

## 2019-12-19 DIAGNOSIS — R519 Headache, unspecified: Secondary | ICD-10-CM

## 2019-12-19 DIAGNOSIS — H34831 Tributary (branch) retinal vein occlusion, right eye, with macular edema: Secondary | ICD-10-CM | POA: Diagnosis not present

## 2019-12-19 DIAGNOSIS — H35373 Puckering of macula, bilateral: Secondary | ICD-10-CM | POA: Diagnosis not present

## 2019-12-19 DIAGNOSIS — H40051 Ocular hypertension, right eye: Secondary | ICD-10-CM

## 2019-12-19 DIAGNOSIS — H3581 Retinal edema: Secondary | ICD-10-CM | POA: Diagnosis not present

## 2019-12-19 DIAGNOSIS — H40001 Preglaucoma, unspecified, right eye: Secondary | ICD-10-CM

## 2019-12-19 DIAGNOSIS — I10 Essential (primary) hypertension: Secondary | ICD-10-CM

## 2019-12-19 DIAGNOSIS — Z961 Presence of intraocular lens: Secondary | ICD-10-CM

## 2019-12-20 ENCOUNTER — Encounter (INDEPENDENT_AMBULATORY_CARE_PROVIDER_SITE_OTHER): Payer: Self-pay | Admitting: Ophthalmology

## 2019-12-20 MED ORDER — BEVACIZUMAB CHEMO INJECTION 1.25MG/0.05ML SYRINGE FOR KALEIDOSCOPE
1.2500 mg | INTRAVITREAL | Status: AC | PRN
  Administered 2019-12-20: 1.25 mg via INTRAVITREAL

## 2020-01-02 ENCOUNTER — Ambulatory Visit: Payer: Medicare PPO | Admitting: Family Medicine

## 2020-01-14 IMAGING — MR MRI HEAD WITHOUT CONTRAST
13 series · 48 of 48 positions shown · non-contrast
Comparison: 05/01/2019 CT head.

CLINICAL DATA: 82 y/o  F; 1 hour of sudden headache.  Dizziness.

EXAM:
MRI HEAD WITHOUT CONTRAST
TECHNIQUE: Multiplanar, multiecho pulse sequences of the brain and surrounding
structures were obtained without intravenous contrast.

[Series 5: DWI · axial · 3.0mm · 0.88mm/px · z∈[-105,+14]mm · 8 of 88 slices shown (1 of 4)]
[im 1/88]
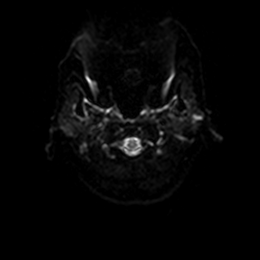
[im 13/88]
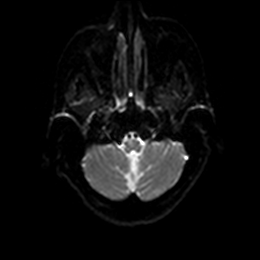
[im 25/88]
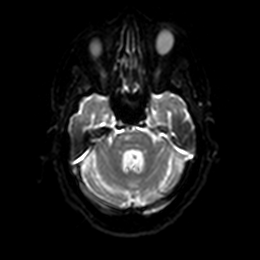
[im 38/88]
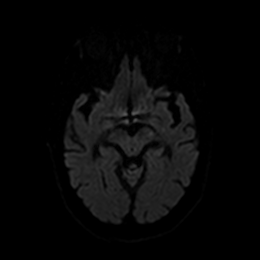
[im 50/88]
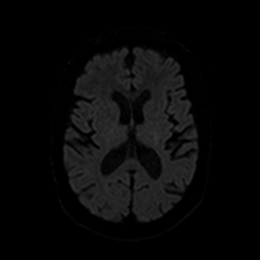
[im 63/88]
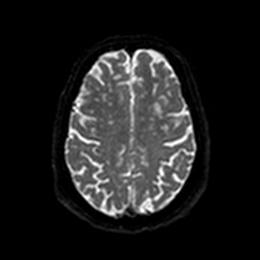
[im 75/88]
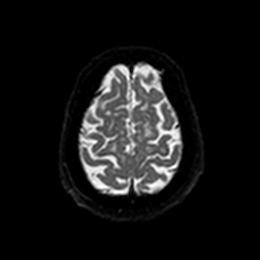
[im 88/88]
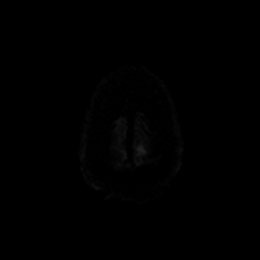

[Series 6: DWI · axial · 3.0mm · 0.88mm/px · z∈[-105,+14]mm · 4 of 44 slices shown (2 of 4)]
[im 1/44]
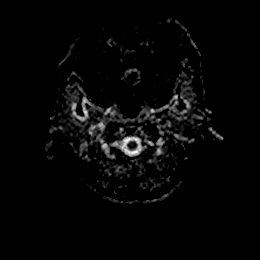
[im 15/44]
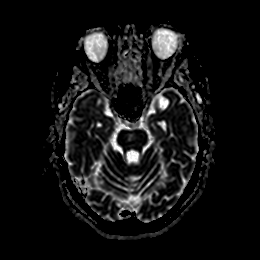
[im 29/44]
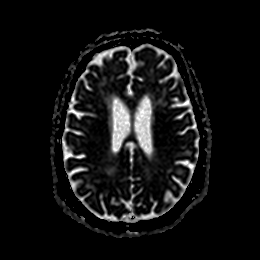
[im 44/44]
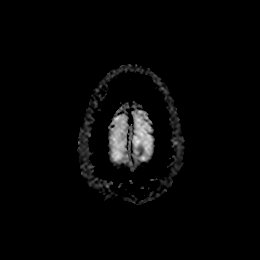

[Series 7: DWI · coronal · 4.0mm · 0.88mm/px · 5 of 66 slices shown (3 of 4)]
[im 1/66]
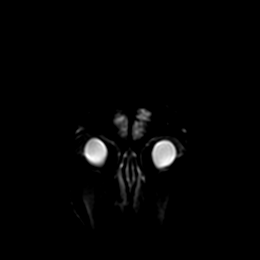
[im 17/66]
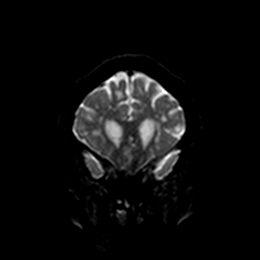
[im 33/66]
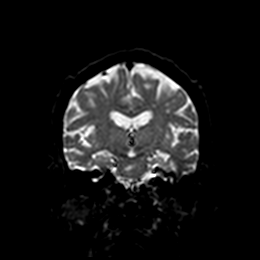
[im 49/66]
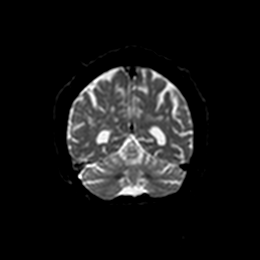
[im 66/66]
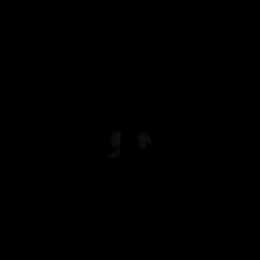

[Series 8: DWI · coronal · 4.0mm · 0.88mm/px · 3 of 33 slices shown (4 of 4)]
[im 1/33]
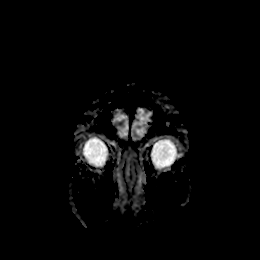
[im 17/33]
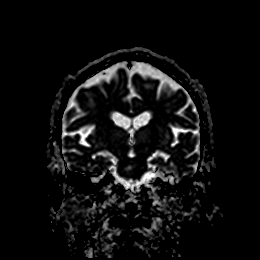
[im 33/33]
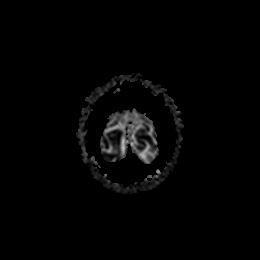

[Series 9: T1 · sagittal · 5.0mm · 0.75mm/px · 2 of 23 slices shown]
[im 1/23]
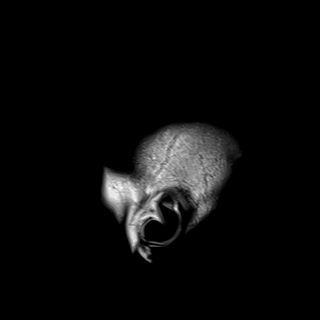
[im 23/23]
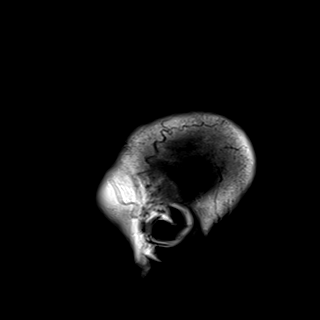

[Series 10: T2 · axial · 5.0mm · 0.72mm/px · z∈[-113,+20]mm · 2 of 25 slices shown (1 of 2)]
[im 1/25]
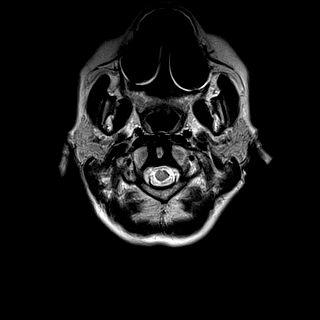
[im 25/25]
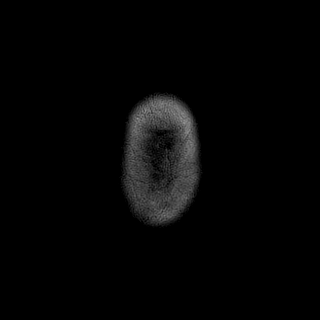

[Series 11: FLAIR · axial · 5.0mm · 0.90mm/px · z∈[-115,+18]mm · 2 of 25 slices shown (1 of 2)]
[im 1/25]
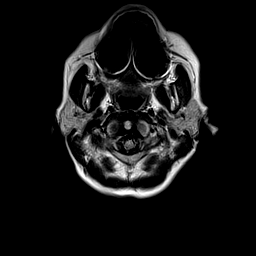
[im 25/25]
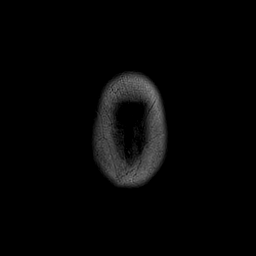

[Series 12: mag_images · axial · 3.0mm · 0.90mm/px · z∈[-122,+42]mm · 5 of 60 slices shown]
[im 1/60]
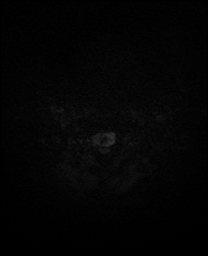
[im 15/60]
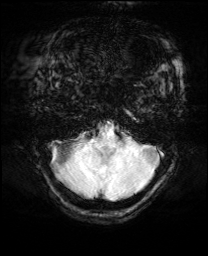
[im 30/60]
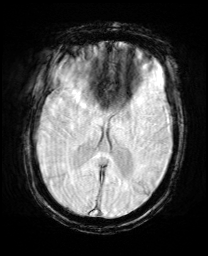
[im 45/60]
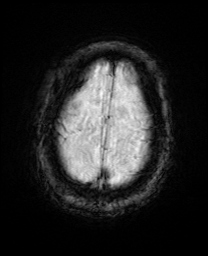
[im 60/60]
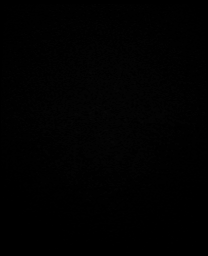

[Series 13: pha_images · axial · 3.0mm · 0.90mm/px · z∈[-122,+20]mm · 4 of 52 slices shown]
[im 1/52]
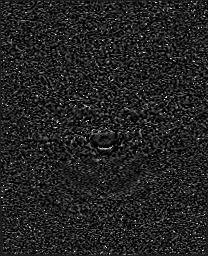
[im 18/52]
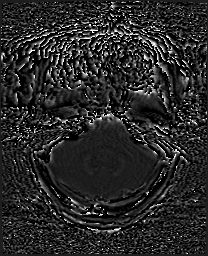
[im 35/52]
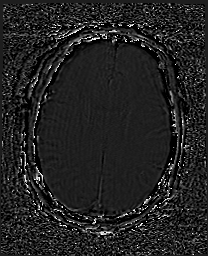
[im 52/52]
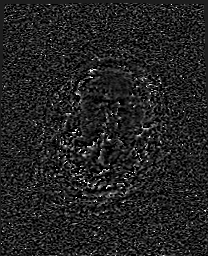

[Series 14: swi_images · axial · 3.0mm · 0.90mm/px · z∈[-122,+42]mm · 5 of 60 slices shown]
[im 1/60]
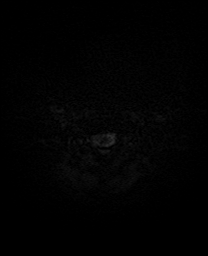
[im 15/60]
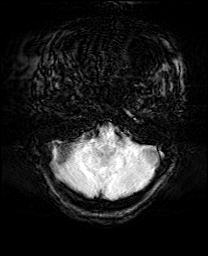
[im 30/60]
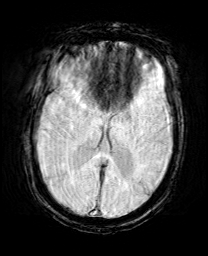
[im 45/60]
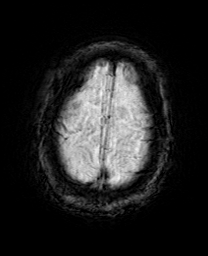
[im 60/60]
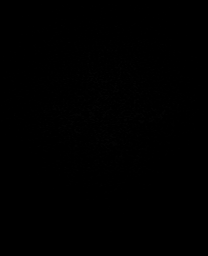

[Series 15: mip_images(sw) · axial · 24.0mm · 0.90mm/px · z∈[-112,+32]mm · 4 of 53 slices shown]
[im 1/53]
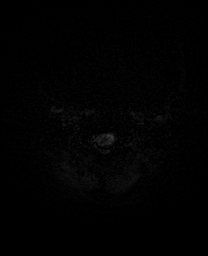
[im 18/53]
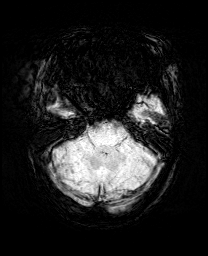
[im 35/53]
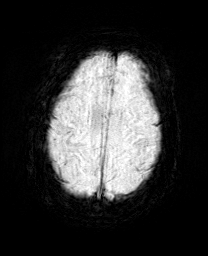
[im 53/53]
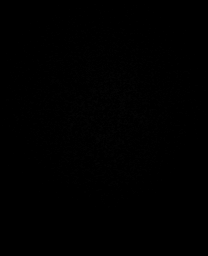

[Series 16: FLAIR · axial · 5.0mm · 0.90mm/px · z∈[-116,+17]mm · 2 of 25 slices shown (2 of 2)]
[im 1/25]
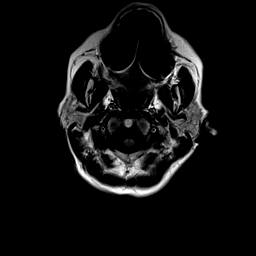
[im 25/25]
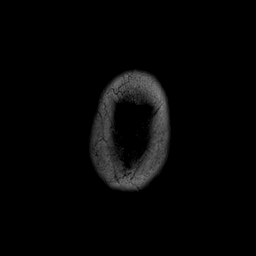

[Series 17: T2 · coronal · 5.0mm · 0.72mm/px · 2 of 27 slices shown (2 of 2)]
[im 1/27]
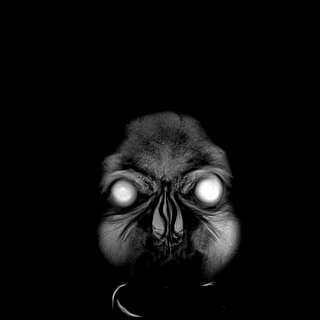
[im 27/27]
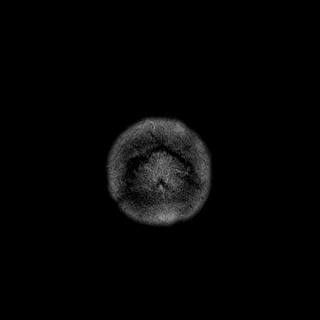

[48 of 48 positions shown; findings below may reference images not displayed]

FINDINGS: Brain: No acute infarction, hemorrhage, hydrocephalus, extra-axial
collection or mass lesion. Small chronic infarct in right hemi pons.
Early confluent nonspecific T2 FLAIR hyperintensities in subcortical
and periventricular white matter are compatible with moderate
chronic microvascular ischemic changes. Moderate volume loss of the
brain.

Vascular: Normal flow voids.

Skull and upper cervical spine: Normal marrow signal.

Sinuses/Orbits: Negative.

Other: Bilateral intra-ocular lens replacement.
IMPRESSION: 1. No acute intracranial abnormality identified.
2. Moderate chronic microvascular ischemic changes and volume loss
of the brain. Small chronic infarct in right hemi pons.

## 2020-01-22 NOTE — Progress Notes (Signed)
[facility] Note  01/24/2020     CHIEF COMPLAINT Patient presents for Retina Follow Up   HISTORY OF PRESENT ILLNESS: Alejandra Davis is a 84 y.o. female who presents to the clinic today for:   HPI    Retina Follow Up    Patient presents with  CRVO/BRVO.  In right eye.  Severity is moderate.  Duration of 5 weeks.  Since onset it is stable.  I, the attending physician,  performed the HPI with the patient and updated documentation appropriately.          Comments    Patient states vision the same OU. Was taking metformin, but taken off per doctor. Doesn't check BS at home. Hasn't checked BP recently. Using cosopt bid OD.        Last edited by [name], MD on 01/24/2020  1:15 PM. (History)       Referring physician: [name], NP [place],  [place] [address]  HISTORICAL INFORMATION:   Selected notes from the MEDICAL RECORD NUMBER Referred by [name] for DM exam    CURRENT MEDICATIONS: Current Outpatient Medications (Ophthalmic Drugs)  Medication Sig  . dorzolamide-timolol (COSOPT) 22.3-6.8 MG/ML ophthalmic solution Place 1 drop into the right eye 2 (two) times daily.   No current facility-administered medications for this visit. (Ophthalmic Drugs)   Current Outpatient Medications (Other)  Medication Sig  . amLODipine (NORVASC) 10 MG tablet Take 1 tablet (10 mg total) by mouth daily.  Marland Kitchen levothyroxine (SYNTHROID) 50 MCG tablet Take 50 mcg by mouth every morning.  . metoprolol succinate (TOPROL-XL) 50 MG 24 hr tablet TAKE 1 TABLET(50 MG) BY MOUTH DAILY  . metFORMIN (GLUCOPHAGE-XR) 500 MG 24 hr tablet Take 500 mg by mouth daily.   Current Facility-Administered Medications (Other)  Medication Route  . Bevacizumab (AVASTIN) SOLN 1.25 mg Intravitreal  . Bevacizumab (AVASTIN) SOLN 1.25 mg Intravitreal  . Bevacizumab (AVASTIN) SOLN 1.25 mg Intravitreal  . Bevacizumab (AVASTIN) SOLN 1.25 mg Intravitreal      REVIEW OF  SYSTEMS: ROS    Positive for: Skin, Musculoskeletal, Endocrine, Eyes   Negative for: Constitutional, Gastrointestinal, Neurological, Genitourinary, HENT, Cardiovascular, Respiratory, Psychiatric, Allergic/Imm, Heme/Lymph   Last edited by Roselee Nova D, COT on 01/24/2020  9:52 AM. (History)       ALLERGIES Allergies  Allergen Reactions  . Donepezil Hcl     PAST MEDICAL HISTORY Past Medical History:  Diagnosis Date  . Chicken pox   . Cystocele   . Diabetes mellitus without complication ([place])   . Glaucoma   . Hypertension   . Hypertensive retinopathy    OU  . Neuromuscular disorder ([place])    Dementia   . Thyroid disease    hypothyroidism   Past Surgical History:  Procedure Laterality Date  . ABDOMINAL HYSTERECTOMY  1990   TAH BSO  . CATARACT EXTRACTION Bilateral    Dr. [name]  . EYE SURGERY Bilateral    Cat Sx OU  . OOPHORECTOMY     BSO  . Vaginal Bx     Papilloma    FAMILY HISTORY Family History  Problem Relation Age of Onset  . Sickle cell anemia Other   . Hypertension Mother   . Cancer Father        LIVER  . Heart disease Brother   . Cancer Brother        STOMACH    SOCIAL HISTORY Social History   Tobacco Use  .  Smoking status: Never Smoker  . Smokeless tobacco: Never Used  Substance Use Topics  . Alcohol use: No  . Drug use: No         OPHTHALMIC EXAM:  Base Eye Exam    Visual Acuity (Snellen - Linear)      Right Left   Dist cc 20/40 +2 20/30   Dist ph cc NI 20/25 -2       Tonometry (Tonopen, 9:59 AM)      Right Left   Pressure 14 13       Pupils      Dark Light Shape React APD   Right 3 2 Round Slow None   Left 3 2 Round Slow None       Visual Fields (Counting fingers)      Left Right    Full Full       Extraocular Movement      Right Left    Full, Ortho Full, Ortho       Neuro/Psych    Oriented x3: Yes   Mood/Affect: Normal       Dilation    Both eyes: 1.0% Mydriacyl, 2.5% Phenylephrine @ 9:59 AM         Slit Lamp and Fundus Exam    Slit Lamp Exam      Right Left   Lids/Lashes Dermatochalasis - upper lid, Meibomian gland dysfunction Dermatochalasis - upper lid, Meibomian gland dysfunction   Conjunctiva/Sclera Melanosis, no injection Melanosis   Cornea Arcus, 1+ Punctate epithelial erosions, mild Anterior basement membrane dystrophy, Well healed cataract wounds, tr Descemet's folds tempoally Arcus, Inferior 1+ Punctate epithelial erosions; 2-3+ microcystic edema temporal with 2+ descemet folds, Keratic precipitates.  Well healed temporal cat sx incision.   Anterior Chamber Deep, no cell or flare Deep;  no cell or flare   Iris Round and dilated, No NVI Round and dilated, No NVI   Lens PC IOL in good position, trace Posterior capsular opacification Posterior chamber intraocular lens in good position.  1+ PCO.   Vitreous Vitreous syneresis Vitreous syneresis       Fundus Exam      Right Left   Disc Sharp rim, inferior notch, 2-3+ pallor, inf. Rim thinning, mild disc heme at 11:00 (fading), attenuated vessels superiorly, superior hyperemia--improved 1+ pallor   C/D Ratio 0.7 0.6   Macula Flat, good foveal reflex, DBH temporal macula - persistent, Cystic changes--slightly improved Flat, good foveal reflex, RPE mottling, Epiretinal membrane, No heme or edema   Vessels Severe vascular attenuation superior>inferior, BRVO Mild vascular attenuation, AV crossing changes   Periphery Attached, scattered superior DBH-persistent Attached, scattered Reticular degeneration        Refraction    Wearing Rx      Sphere Cylinder Axis Add   Right -0.75 +1.25 170 +2.50   Left -0.75 +1.00 003 +2.50   Type: PAL          IMAGING AND PROCEDURES  Imaging and Procedures for 03/07/18  OCT, Retina - OU - Both Eyes       Right Eye Quality was good. Central Foveal Thickness: 261. Progression has improved. Findings include no SRF, epiretinal membrane, normal foveal contour, intraretinal fluid (interval  improvement in IRF/cystic changes--temporal macula).   Left Eye Quality was good. Central Foveal Thickness: 260. Progression has been stable. Findings include abnormal foveal contour, epiretinal membrane, no IRF, no SRF, vitreomacular adhesion  (Blunted foveal depression).   Notes *Images captured and stored on drive  Diagnosis / Impression:  ERM OU OD: interval improvement in IRF/cystic changes--temporal macula OS: very mild ERM with blunted foveal depression -- stable; mild VMA  Clinical management:  See below  Abbreviations: NFP - Normal foveal profile. CME - cystoid macular edema. PED - pigment epithelial detachment. IRF - intraretinal fluid. SRF - subretinal fluid. EZ - ellipsoid zone. ERM - epiretinal membrane. ORA - outer retinal atrophy. ORT - outer retinal tubulation. SRHM - subretinal hyper-reflective material         Intravitreal Injection, Pharmacologic Agent - OD - Right Eye       Time Out 01/24/2020. 10:55 AM. Confirmed correct patient, procedure, site, and patient consented.   Anesthesia Topical anesthesia was used. Anesthetic medications included Lidocaine 2%, Proparacaine 0.5%.   Procedure Preparation included 5% betadine to ocular surface, eyelid speculum. A 30 gauge needle was used.   Injection:  1.25 mg Bevacizumab (AVASTIN) SOLN   NDC: [id number], Lot: [id number] , Expiration date: 03/28/2020   Route: Intravitreal, Site: Right Eye, Waste: 0 mL  Post-op Post injection exam found visual acuity of at least counting fingers. The patient tolerated the procedure well. There were no complications. The patient received written and verbal post procedure care education.                 ASSESSMENT/PLAN:    ICD-10-CM   1. Branch retinal vein occlusion of right eye with macular edema  H34.8310 Intravitreal Injection, Pharmacologic Agent - OD - Right Eye    Bevacizumab (AVASTIN) SOLN 1.25 mg  2. Diabetes mellitus type 2 without retinopathy ([place])  E11.9    3. Epiretinal membrane (ERM) of both eyes  H35.373   4. Retinal edema  H35.81 OCT, Retina - OU - Both Eyes  5. Pseudophakia of both eyes  Z96.1   6. Ocular hypertension of right eye  H40.051   7. Glaucoma suspect of right eye  H40.001   8. Essential hypertension  I10   9. Hypertensive retinopathy of both eyes  H35.033   10. Headache above the eye region  R51.9   11. Combined forms of age-related cataract of both eyes  H25.813     1. BRVO with CME OD  - at presentation, BCVA 20/60 OD -- down from 20/30  - initial OCT w/ CME superior macula  - s/p IVA OD #1 (10.18.19), #2 (11.19.19), #3 (12.17.19), #4 (02.19.20), #5 (03.23.20), #6 (04.21.20), #7 (05.27.20), #8 (07.07.20), #9 (08.25.20), #10 (9.29.20), #11 (11.17.20), #12 (12.22.20)  - good response to medications, but held IVA in January 2020 due to cataract surgery  - today, BCVA stable at 20/40, OCT shows mild interval improvement in cystic changes   - recommend IVA OD #13 today, 01.27.21  - pt wishes to proceed  - RBA of procedure discussed, questions answered  - informed consent obtained and signed  - see procedure note  - history of interval increase in IRF/cystic changes at 6+ weeks interval   - f/u 5 weeks -- DFE/OCT  2. Diabetes mellitus, type 2 without retinopathy  - The incidence, risk factors for progression, natural history and treatment options for diabetic retinopathy  were discussed with patient.    - The need for close monitoring of blood glucose, blood pressure, and serum lipids, avoiding cigarette or any type of tobacco, and the need for long term follow up was also discussed with patient.  3,4. Epiretinal membrane, OU  - relatively mild ERM OU -- blunted central foveal depression  - OD with mild cystic changes 2/2 BRVO as above; OS  without cystic changes or edema  - discussed findings and prognosis  - recommend monitoring for now  5. Pseudophakia OU  - s/p CE/IOL OS 11.13.19 w/ Dr. [name]  - s/p CE/IOL OD  01.16.20 w/ Dr. [name]  - beautiful surgeries -- IOLs in perfect position  - healing well post-operatively  - IRF/CME OD may have been partly due to post op CME ([place]) in addition to BRVO  6,7. Ocular hypertension / Glaucoma suspect OD>OS  - IOP ~25 OD, 20 OS at prior clinic visits  - today IOP 14 OD, OS is 13 -- on cosopt BID OD  - denies any family hx of glaucoma  - under the expert care of Dr. [name]  - cont Cosopt BID OD  8-10. Hypertensive retinopathy OU w/ symptomatic headache  - pt presented to 6.24.2020 visit urgently for right sided headache above right eye with extension to occiput  - BP at that time was 201/100 -- pt was sent to primary care clinic for urgent evaluation and meds were adjusted  - BP now under better control and headaches improved  - discussed importance of tight BP control   Ophthalmic Meds Ordered this visit:  Meds ordered this encounter  Medications  . Bevacizumab (AVASTIN) SOLN 1.25 mg       Return in about 5 weeks (around 02/28/2020) for Dilated exam, OCT.  There are no Patient Instructions on file for this visit.   This document serves as a record of services personally performed by Gardiner Sleeper, MD, PhD. It was created on their behalf by Roselee Nova, COMT. The creation of this record is the provider's dictation and/or activities during the visit.  Electronically signed by: Roselee Nova, COMT 01/24/20 1:18 PM   This document serves as a record of services personally performed by Gardiner Sleeper, MD, PhD. It was created on their behalf by Ernest Mallick, OA, an ophthalmic assistant. The creation of this record is the provider's dictation and/or activities during the visit.    Electronically signed by: Ernest Mallick, OA 01.27.2021 1:18 PM  Gardiner Sleeper, M.D., Ph.D. Diseases & Surgery of the Retina and [place] 01/24/2020   I have reviewed the above documentation for accuracy and completeness, and I  agree with the above. Gardiner Sleeper, M.D., Ph.D. 01/24/20 1:18 PM    Abbreviations: M myopia (nearsighted); A astigmatism; H hyperopia (farsighted); P presbyopia; Mrx spectacle prescription;  CTL contact lenses; OD right eye; OS left eye; OU both eyes  XT exotropia; ET esotropia; PEK punctate epithelial keratitis; PEE punctate epithelial erosions; DES dry eye syndrome; MGD meibomian gland dysfunction; ATs artificial tears; PFAT's preservative free artificial tears; [place] nuclear sclerotic cataract; PSC posterior subcapsular cataract; ERM epi-retinal membrane; PVD posterior vitreous detachment; RD retinal detachment; DM diabetes mellitus; DR diabetic retinopathy; NPDR non-proliferative diabetic retinopathy; PDR proliferative diabetic retinopathy; CSME clinically significant macular edema; DME diabetic macular edema; dbh dot blot hemorrhages; CWS cotton wool spot; POAG primary open angle glaucoma; C/D cup-to-disc ratio; HVF humphrey visual field; GVF goldmann visual field; OCT optical coherence tomography; IOP intraocular pressure; BRVO Branch retinal vein occlusion; CRVO central retinal vein occlusion; CRAO central retinal artery occlusion; BRAO branch retinal artery occlusion; RT retinal tear; SB scleral buckle; PPV pars plana vitrectomy; VH Vitreous hemorrhage; PRP panretinal laser photocoagulation; IVK intravitreal kenalog; VMT vitreomacular traction; MH Macular hole;  NVD neovascularization of the disc; NVE neovascularization elsewhere; AREDS age related eye disease study; ARMD age related macular degeneration; POAG  primary open angle glaucoma; EBMD epithelial/anterior basement membrane dystrophy; ACIOL anterior chamber intraocular lens; IOL intraocular lens; PCIOL posterior chamber intraocular lens; Phaco/IOL phacoemulsification with intraocular lens placement; [place] photorefractive keratectomy; LASIK laser assisted in situ keratomileusis; HTN hypertension; DM diabetes mellitus; COPD chronic obstructive  pulmonary disease

## 2020-01-23 ENCOUNTER — Encounter (INDEPENDENT_AMBULATORY_CARE_PROVIDER_SITE_OTHER): Payer: Medicare Other | Admitting: Ophthalmology

## 2020-01-24 ENCOUNTER — Other Ambulatory Visit: Payer: Self-pay

## 2020-01-24 ENCOUNTER — Encounter (INDEPENDENT_AMBULATORY_CARE_PROVIDER_SITE_OTHER): Payer: Self-pay | Admitting: Ophthalmology

## 2020-01-24 ENCOUNTER — Ambulatory Visit (INDEPENDENT_AMBULATORY_CARE_PROVIDER_SITE_OTHER): Payer: Medicare PPO | Admitting: Ophthalmology

## 2020-01-24 DIAGNOSIS — H25813 Combined forms of age-related cataract, bilateral: Secondary | ICD-10-CM

## 2020-01-24 DIAGNOSIS — R519 Headache, unspecified: Secondary | ICD-10-CM

## 2020-01-24 DIAGNOSIS — H35373 Puckering of macula, bilateral: Secondary | ICD-10-CM | POA: Diagnosis not present

## 2020-01-24 DIAGNOSIS — I10 Essential (primary) hypertension: Secondary | ICD-10-CM

## 2020-01-24 DIAGNOSIS — H40051 Ocular hypertension, right eye: Secondary | ICD-10-CM | POA: Diagnosis not present

## 2020-01-24 DIAGNOSIS — Z961 Presence of intraocular lens: Secondary | ICD-10-CM | POA: Diagnosis not present

## 2020-01-24 DIAGNOSIS — H35033 Hypertensive retinopathy, bilateral: Secondary | ICD-10-CM | POA: Diagnosis not present

## 2020-01-24 DIAGNOSIS — H40001 Preglaucoma, unspecified, right eye: Secondary | ICD-10-CM

## 2020-01-24 DIAGNOSIS — E119 Type 2 diabetes mellitus without complications: Secondary | ICD-10-CM

## 2020-01-24 DIAGNOSIS — H3581 Retinal edema: Secondary | ICD-10-CM | POA: Diagnosis not present

## 2020-01-24 DIAGNOSIS — H34831 Tributary (branch) retinal vein occlusion, right eye, with macular edema: Secondary | ICD-10-CM

## 2020-01-24 MED ORDER — BEVACIZUMAB CHEMO INJECTION 1.25MG/0.05ML SYRINGE FOR KALEIDOSCOPE
1.2500 mg | INTRAVITREAL | Status: AC | PRN
  Administered 2020-01-24: 1.25 mg via INTRAVITREAL

## 2020-02-21 NOTE — Telephone Encounter (Signed)
Alejandra Bloodgood, Alejandra Davis latest Hgb A1C was 6.9 consistent with Type 2 diabetes.she has an upcoming appointment 03/12/2020.I recommend fasting lab work prior to this visit since her appointment is 10:30 Am. Thank You, Dinah Ngetich FNP-C

## 2020-02-22 DIAGNOSIS — Z01419 Encounter for gynecological examination (general) (routine) without abnormal findings: Secondary | ICD-10-CM | POA: Diagnosis not present

## 2020-02-22 DIAGNOSIS — Z1231 Encounter for screening mammogram for malignant neoplasm of breast: Secondary | ICD-10-CM | POA: Diagnosis not present

## 2020-02-23 ENCOUNTER — Encounter: Payer: Self-pay | Admitting: Podiatry

## 2020-02-23 ENCOUNTER — Ambulatory Visit: Payer: Medicare PPO | Admitting: Podiatry

## 2020-02-23 ENCOUNTER — Other Ambulatory Visit: Payer: Self-pay

## 2020-02-23 VITALS — BP 146/74 | HR 60 | Temp 97.9°F

## 2020-02-23 DIAGNOSIS — E119 Type 2 diabetes mellitus without complications: Secondary | ICD-10-CM

## 2020-02-23 NOTE — Progress Notes (Signed)
This patient presents to the office with chief complaint of diabetic feet.  She was told to have her feet evaluated by her medical doctor.  She presents to the office with her daughter.  She is diabetic and is taking metformin.  She says she is having no foot problems  Today.  She presents for a diabetic foot exam.  Patient says she trims her own nails.  General Appearance  Alert, conversant and in no acute stress.  Vascular  Dorsalis pedis and posterior tibial  pulses are palpable  bilaterally.  Capillary return is within normal limits  bilaterally. Temperature is within normal limits  bilaterally.  Neurologic  Senn-Weinstein monofilament wire test within normal limits  bilaterally. Muscle power within normal limits bilaterally.  Nails Normal nails noted both feet.  Orthopedic  No limitations of motion of motion feet .  No crepitus or effusions noted.  No bony pathology or digital deformities noted.  HAV  B/L.  Skin  normotropic skin with no porokeratosis noted bilaterally.  No signs of infections or ulcers noted.     Onychomycosis  Diabetes with no foot complications  IE   A diabetic foot exam was performed and there is no evidence of any vascular or neurologic pathology.   RTC 1 year.   Gardiner Barefoot DPM

## 2020-02-27 ENCOUNTER — Encounter (INDEPENDENT_AMBULATORY_CARE_PROVIDER_SITE_OTHER): Payer: Medicare PPO | Admitting: Ophthalmology

## 2020-02-28 ENCOUNTER — Encounter (INDEPENDENT_AMBULATORY_CARE_PROVIDER_SITE_OTHER): Payer: Medicare PPO | Admitting: Ophthalmology

## 2020-03-01 NOTE — Progress Notes (Signed)
[facility] Note  03/06/2020     CHIEF COMPLAINT Patient presents for Retina Follow Up   HISTORY OF PRESENT ILLNESS: Alejandra Davis is a 84 y.o. female who presents to the clinic today for:   HPI    Retina Follow Up    Patient presents with  CRVO/BRVO.  In right eye.  This started weeks ago.  Severity is moderate.  Duration of weeks.  Since onset it is stable.  I, the attending physician,  performed the HPI with the patient and updated documentation appropriately.          Comments    Pt states vision is about the same OU.  Pt denies eye pain or discomfort and denies any new or worsening floaters or fol OU.       Last edited by [name], MD on 03/06/2020  3:42 PM. (History)       Referring physician: [name], NP [place],  [place] [address]  HISTORICAL INFORMATION:   Selected notes from the MEDICAL RECORD NUMBER Referred by [name] for DM exam    CURRENT MEDICATIONS: Current Outpatient Medications (Ophthalmic Drugs)  Medication Sig  . dorzolamide-timolol (COSOPT) 22.3-6.8 MG/ML ophthalmic solution Place 1 drop into the right eye 2 (two) times daily.   No current facility-administered medications for this visit. (Ophthalmic Drugs)   Current Outpatient Medications (Other)  Medication Sig  . amLODipine (NORVASC) 10 MG tablet Take 1 tablet (10 mg total) by mouth daily.  Marland Kitchen levothyroxine (SYNTHROID) 50 MCG tablet Take 50 mcg by mouth every morning.  . metFORMIN (GLUCOPHAGE-XR) 500 MG 24 hr tablet Take 500 mg by mouth daily.  . metoprolol succinate (TOPROL-XL) 50 MG 24 hr tablet TAKE 1 TABLET(50 MG) BY MOUTH DAILY   Current Facility-Administered Medications (Other)  Medication Route  . Bevacizumab (AVASTIN) SOLN 1.25 mg Intravitreal  . Bevacizumab (AVASTIN) SOLN 1.25 mg Intravitreal  . Bevacizumab (AVASTIN) SOLN 1.25 mg Intravitreal  . Bevacizumab (AVASTIN) SOLN 1.25 mg Intravitreal      REVIEW OF  SYSTEMS: ROS    Positive for: Skin, Musculoskeletal, Endocrine, Eyes   Negative for: Constitutional, Gastrointestinal, Neurological, Genitourinary, HENT, Cardiovascular, Respiratory, Psychiatric, Allergic/Imm, Heme/Lymph   Last edited by Doneen Poisson on 03/06/2020  2:38 PM. (History)       ALLERGIES Allergies  Allergen Reactions  . Donepezil Hcl     PAST MEDICAL HISTORY Past Medical History:  Diagnosis Date  . Chicken pox   . Cystocele   . Diabetes mellitus without complication ([place])   . Glaucoma   . Hypertension   . Hypertensive retinopathy    OU  . Neuromuscular disorder ([place])    Dementia   . Thyroid disease    hypothyroidism   Past Surgical History:  Procedure Laterality Date  . ABDOMINAL HYSTERECTOMY  1990   TAH BSO  . CATARACT EXTRACTION Bilateral    Dr. [name]  . EYE SURGERY Bilateral    Cat Sx OU  . OOPHORECTOMY     BSO  . Vaginal Bx     Papilloma    FAMILY HISTORY Family History  Problem Relation Age of Onset  . Sickle cell anemia Other   . Hypertension Mother   . Cancer Father        LIVER  . Heart disease Brother   . Cancer Brother        STOMACH    SOCIAL HISTORY Social History   Tobacco Use  .  Smoking status: Never Smoker  . Smokeless tobacco: Never Used  Substance Use Topics  . Alcohol use: No  . Drug use: No         OPHTHALMIC EXAM:  Base Eye Exam    Visual Acuity (Snellen - Linear)      Right Left   Dist cc 20/40 -1 20/30 -2   Dist ph cc NI 20/30 +1   Correction: Glasses       Tonometry (Tonopen, 2:40 PM)      Right Left   Pressure 15 16       Pupils      Dark Light Shape React APD   Right 3 2 Round Minimal 0   Left 3 2 Round Minimal 0       Visual Fields      Left Right    Full Full       Extraocular Movement      Right Left    Full Full       Neuro/Psych    Oriented x3: Yes   Mood/Affect: Normal       Dilation    Both eyes: 1.0% Mydriacyl, 2.5% Phenylephrine @ 2:40 PM        Slit Lamp  and Fundus Exam    Slit Lamp Exam      Right Left   Lids/Lashes Dermatochalasis - upper lid, Meibomian gland dysfunction Dermatochalasis - upper lid, Meibomian gland dysfunction   Conjunctiva/Sclera Melanosis, no injection Melanosis   Cornea Arcus, 1+ Punctate epithelial erosions, mild Anterior basement membrane dystrophy, Well healed cataract wounds, tr Descemet's folds tempoally Arcus, Inferior 1+ Punctate epithelial erosions; 2-3+ microcystic edema temporal with 2+ descemet folds, Keratic precipitates.  Well healed temporal cat sx incision.   Anterior Chamber Deep, no cell or flare Deep;  no cell or flare   Iris Round and dilated, No NVI Round and dilated, No NVI   Lens PC IOL in good position, trace Posterior capsular opacification Posterior chamber intraocular lens in good position.  1+ PCO.   Vitreous Vitreous syneresis Vitreous syneresis       Fundus Exam      Right Left   Disc Sharp rim, inferior notch, 2-3+ pallor, inf. Rim thinning, mild disc heme at 11:00 (fading), attenuated vessels superiorly, superior hyperemia--improved 1-2+ pallor   C/D Ratio 0.7 0.6   Macula Flat, blunted foveal reflex, DBH temporal macula - persistent, but improved; temporal Cystic changes--improved Flat, good foveal reflex, RPE mottling, Epiretinal membrane, No heme or edema   Vessels Severe vascular attenuation superior>inferior, BRVO Mild vascular attenuation, AV crossing changes   Periphery Attached, scattered superior DBH-persistent Attached, scattered Reticular degeneration        Refraction    Wearing Rx      Sphere Cylinder Axis Add   Right -0.75 +1.25 170 +2.50   Left -0.75 +1.00 003 +2.50   Type: PAL          IMAGING AND PROCEDURES  Imaging and Procedures for 03/07/18  OCT, Retina - OU - Both Eyes       Right Eye Quality was good. Central Foveal Thickness: 267. Progression has improved. Findings include no SRF, epiretinal membrane, normal foveal contour, intraretinal fluid (Blunted  foveal contour, interval improvement in IRF/cystic changes--temporal macula).   Left Eye Quality was good. Central Foveal Thickness: 264. Progression has been stable. Findings include abnormal foveal contour, epiretinal membrane, no IRF, no SRF, vitreomacular adhesion  (Blunted foveal depression).   Notes *Images captured and stored on  drive  Diagnosis / Impression:  ERM OU OD: blunted foveal contour, interval improvement in IRF/cystic changes--temporal macula OS: very mild ERM with blunted foveal depression -- stable; mild VMA  Clinical management:  See below  Abbreviations: NFP - Normal foveal profile. CME - cystoid macular edema. PED - pigment epithelial detachment. IRF - intraretinal fluid. SRF - subretinal fluid. EZ - ellipsoid zone. ERM - epiretinal membrane. ORA - outer retinal atrophy. ORT - outer retinal tubulation. SRHM - subretinal hyper-reflective material         Intravitreal Injection, Pharmacologic Agent - OD - Right Eye       Time Out 03/06/2020. 3:24 PM. Confirmed correct patient, procedure, site, and patient consented.   Anesthesia Topical anesthesia was used. Anesthetic medications included Lidocaine 2%, Proparacaine 0.5%.   Procedure Preparation included 5% betadine to ocular surface, eyelid speculum. A supplied needle was used.   Injection:  1.25 mg Bevacizumab (AVASTIN) SOLN   NDC: [id number], Lot: [id number] , Expiration date: 04/23/2020   Route: Intravitreal, Site: Right Eye, Waste: 0 mL  Post-op Post injection exam found visual acuity of at least counting fingers. The patient tolerated the procedure well. There were no complications. The patient received written and verbal post procedure care education.                 ASSESSMENT/PLAN:    ICD-10-CM   1. Branch retinal vein occlusion of right eye with macular edema  H34.8310 Intravitreal Injection, Pharmacologic Agent - OD - Right Eye    Bevacizumab (AVASTIN) SOLN 1.25 mg  2. Diabetes  mellitus type 2 without retinopathy ([place])  E11.9   3. Epiretinal membrane (ERM) of both eyes  H35.373   4. Retinal edema  H35.81 OCT, Retina - OU - Both Eyes  5. Pseudophakia of both eyes  Z96.1   6. Ocular hypertension of right eye  H40.051   7. Glaucoma suspect of right eye  H40.001   8. Essential hypertension  I10   9. Hypertensive retinopathy of both eyes  H35.033   10. Headache above the eye region  R51.9     1. BRVO with CME OD  - at presentation, BCVA 20/60 OD -- down from 20/30  - initial OCT w/ CME superior macula  - s/p IVA OD #1 (10.18.19), #2 (11.19.19), #3 (12.17.19), #4 (02.19.20), #5 (03.23.20), #6 (04.21.20), #7 (05.27.20), #8 (07.07.20), #9 (08.25.20), #10 (9.29.20), #11 (11.17.20), #12 (12.22.20), #13 (01.27.21)  - good response to medications, but held IVA in January 2020 due to cataract surgery  - today, BCVA stable at 20/40, OCT shows mild interval improvement in cystic changes   - recommend IVA OD #14 today, 03.10.21 with retry of extension to 6 weeks  - pt wishes to proceed  - RBA of procedure discussed, questions answered  - informed consent obtained   - Avastin informed consent form signed and scanned on 01.27.2021  - see procedure note  - history of interval increase in IRF/cystic changes at 6+ weeks interval, but will try again given today's improvement noted on exam and OCT   - f/u 6 weeks -- DFE/OCT, possible injection  2. Diabetes mellitus, type 2 without retinopathy  - The incidence, risk factors for progression, natural history and treatment options for diabetic retinopathy  were discussed with patient.    - The need for close monitoring of blood glucose, blood pressure, and serum lipids, avoiding cigarette or any type of tobacco, and the need for long term follow up was also discussed with patient.  3,4. Epiretinal membrane, OU  - relatively mild ERM OU -- blunted central foveal depression  - OD with mild cystic changes 2/2 BRVO as above; OS without  cystic changes or edema  - discussed findings and prognosis  - recommend monitoring for now  5. Pseudophakia OU  - s/p CE/IOL OS 11.13.19 w/ Dr. [name]  - s/p CE/IOL OD 01.16.20 w/ Dr. [name]  - beautiful surgeries -- IOLs in perfect position  - healing well post-operatively  - IRF/CME OD may have been partly due to post op CME ([place]) in addition to BRVO  6,7. Ocular hypertension / Glaucoma suspect OD>OS  - IOP ~25 OD, 20 OS at prior clinic visits  - today IOP 15 OD, OS is 16 -- on cosopt BID OD  - denies any family hx of glaucoma  - under the expert care of Dr. [name]  - cont Cosopt BID OD  8-10. Hypertensive retinopathy OU w/ symptomatic headache  - pt presented to 6.24.2020 visit urgently for right sided headache above right eye with extension to occiput  - BP at that time was 201/100 -- pt was sent to primary care clinic for urgent evaluation and meds were adjusted  - BP now under better control and headaches improved  - discussed importance of tight BP control   Ophthalmic Meds Ordered this visit:  Meds ordered this encounter  Medications  . Bevacizumab (AVASTIN) SOLN 1.25 mg       Return in about 6 weeks (around 04/17/2020) for f/u BRVO OD, DFE, OCT.  There are no Patient Instructions on file for this visit.  Gardiner Sleeper, M.D., Ph.D. Diseases & Surgery of the Retina and [place] 03/06/2020   I have reviewed the above documentation for accuracy and completeness, and I agree with the above. Gardiner Sleeper, M.D., Ph.D. 03/06/20 4:41 PM   Abbreviations: M myopia (nearsighted); A astigmatism; H hyperopia (farsighted); P presbyopia; Mrx spectacle prescription;  CTL contact lenses; OD right eye; OS left eye; OU both eyes  XT exotropia; ET esotropia; PEK punctate epithelial keratitis; PEE punctate epithelial erosions; DES dry eye syndrome; MGD meibomian gland dysfunction; ATs artificial tears; PFAT's preservative free  artificial tears; [place] nuclear sclerotic cataract; PSC posterior subcapsular cataract; ERM epi-retinal membrane; PVD posterior vitreous detachment; RD retinal detachment; DM diabetes mellitus; DR diabetic retinopathy; NPDR non-proliferative diabetic retinopathy; PDR proliferative diabetic retinopathy; CSME clinically significant macular edema; DME diabetic macular edema; dbh dot blot hemorrhages; CWS cotton wool spot; POAG primary open angle glaucoma; C/D cup-to-disc ratio; HVF humphrey visual field; GVF goldmann visual field; OCT optical coherence tomography; IOP intraocular pressure; BRVO Branch retinal vein occlusion; CRVO central retinal vein occlusion; CRAO central retinal artery occlusion; BRAO branch retinal artery occlusion; RT retinal tear; SB scleral buckle; PPV pars plana vitrectomy; VH Vitreous hemorrhage; PRP panretinal laser photocoagulation; IVK intravitreal kenalog; VMT vitreomacular traction; MH Macular hole;  NVD neovascularization of the disc; NVE neovascularization elsewhere; AREDS age related eye disease study; ARMD age related macular degeneration; POAG primary open angle glaucoma; EBMD epithelial/anterior basement membrane dystrophy; ACIOL anterior chamber intraocular lens; IOL intraocular lens; PCIOL posterior chamber intraocular lens; Phaco/IOL phacoemulsification with intraocular lens placement; [place] photorefractive keratectomy; LASIK laser assisted in situ keratomileusis; HTN hypertension; DM diabetes mellitus; COPD chronic obstructive pulmonary disease

## 2020-03-06 ENCOUNTER — Ambulatory Visit (INDEPENDENT_AMBULATORY_CARE_PROVIDER_SITE_OTHER): Payer: Medicare PPO | Admitting: Ophthalmology

## 2020-03-06 ENCOUNTER — Encounter (INDEPENDENT_AMBULATORY_CARE_PROVIDER_SITE_OTHER): Payer: Self-pay | Admitting: Ophthalmology

## 2020-03-06 DIAGNOSIS — R519 Headache, unspecified: Secondary | ICD-10-CM

## 2020-03-06 DIAGNOSIS — H35373 Puckering of macula, bilateral: Secondary | ICD-10-CM | POA: Diagnosis not present

## 2020-03-06 DIAGNOSIS — I10 Essential (primary) hypertension: Secondary | ICD-10-CM

## 2020-03-06 DIAGNOSIS — H34831 Tributary (branch) retinal vein occlusion, right eye, with macular edema: Secondary | ICD-10-CM | POA: Diagnosis not present

## 2020-03-06 DIAGNOSIS — E119 Type 2 diabetes mellitus without complications: Secondary | ICD-10-CM

## 2020-03-06 DIAGNOSIS — H35033 Hypertensive retinopathy, bilateral: Secondary | ICD-10-CM | POA: Diagnosis not present

## 2020-03-06 DIAGNOSIS — H40001 Preglaucoma, unspecified, right eye: Secondary | ICD-10-CM | POA: Diagnosis not present

## 2020-03-06 DIAGNOSIS — Z961 Presence of intraocular lens: Secondary | ICD-10-CM | POA: Diagnosis not present

## 2020-03-06 DIAGNOSIS — H40051 Ocular hypertension, right eye: Secondary | ICD-10-CM | POA: Diagnosis not present

## 2020-03-06 DIAGNOSIS — H3581 Retinal edema: Secondary | ICD-10-CM | POA: Diagnosis not present

## 2020-03-06 MED ORDER — BEVACIZUMAB CHEMO INJECTION 1.25MG/0.05ML SYRINGE FOR KALEIDOSCOPE
1.2500 mg | INTRAVITREAL | Status: AC | PRN
  Administered 2020-03-06: 1.25 mg via INTRAVITREAL

## 2020-03-12 ENCOUNTER — Ambulatory Visit: Payer: Medicare PPO | Admitting: Family

## 2020-03-13 ENCOUNTER — Encounter (INDEPENDENT_AMBULATORY_CARE_PROVIDER_SITE_OTHER): Payer: Self-pay | Admitting: Ophthalmology

## 2020-03-13 ENCOUNTER — Other Ambulatory Visit: Payer: Self-pay

## 2020-03-13 ENCOUNTER — Encounter: Payer: Self-pay | Admitting: Family

## 2020-03-13 ENCOUNTER — Ambulatory Visit (INDEPENDENT_AMBULATORY_CARE_PROVIDER_SITE_OTHER): Payer: Medicare PPO | Admitting: Family

## 2020-03-13 VITALS — BP 110/62 | HR 63 | Temp 97.7°F | Ht 63.0 in | Wt 123.0 lb

## 2020-03-13 DIAGNOSIS — F0390 Unspecified dementia without behavioral disturbance: Secondary | ICD-10-CM | POA: Diagnosis not present

## 2020-03-13 DIAGNOSIS — R600 Localized edema: Secondary | ICD-10-CM | POA: Diagnosis not present

## 2020-03-13 DIAGNOSIS — E119 Type 2 diabetes mellitus without complications: Secondary | ICD-10-CM

## 2020-03-13 DIAGNOSIS — I10 Essential (primary) hypertension: Secondary | ICD-10-CM

## 2020-03-13 DIAGNOSIS — E039 Hypothyroidism, unspecified: Secondary | ICD-10-CM | POA: Diagnosis not present

## 2020-03-13 DIAGNOSIS — Z23 Encounter for immunization: Secondary | ICD-10-CM

## 2020-03-13 MED ORDER — TETANUS-DIPHTH-ACELL PERTUSSIS 5-2.5-18.5 LF-MCG/0.5 IM SUSP: 0.5000 mL | Freq: Once | INTRAMUSCULAR | 0 refills | Status: AC

## 2020-03-13 NOTE — Progress Notes (Signed)
Provider: Marlowe Sax FNP-C    Groleau, Nelda Bucks, NP  Patient Care Team: Gaylen Pereira, Nelda Bucks, NP as PCP - General (Family Medicine)  Extended Emergency Contact Information Primary Emergency Contact: Maddix,[name] Address: [address]          [place], [place] [address] [place] Phone: [phone] Relation: Daughter Secondary Emergency Contact: Iwata,Mitchell Address: [address]          [place] [address] [place] Mobile Phone: [phone] Relation: Son  Code Status: Full Code  Goals of care: Advanced Directive information Advanced Directives 11/16/2019  Does Patient Have a Medical Advance Directive? Yes  Type of Advance Directive [place]  Does patient want to make changes to medical advance directive? No - Patient declined  Copy of Narka in Chart? No - copy requested  Would patient like information on creating a medical advance directive? -     Chief Complaint  Patient presents with  . Medical Management of Chronic Issues    Alejandra Davis follow-up    HPI:  Pt is a 84y.o. female seen today for medical management of chronic diseases.she is here with her daughter Alejandra Davis states recently had injection to her eye at the Ophthalmology woke today had some red spot on the right eye.Daughter has send message to the Ophthalmologist.she denies any headache,dizziness ,double vision,pain,itching or drainage from eye.Also denies any fever or chills.  Hypertension - does not check blood pressure at home.currently on metoprolol succinate 50 mg 24 Hr  Tablet daily and Amlodipine 10 mg tablet daily.she denies any signs of hypotension.  Hypothyroidism - takes levothyroxine 50 mcg tablet daily on empty stomach.denies any cold or heat intolerance.  Type 2 DM - latest labs reviewed Hgb A1C 6.9 diet controlled.she is off metformin.Daughter states not exercising.No weight gain.Diet and lifestyle  modification discussed.will try to walk as she used to. Recently seen 03/06/2020  by Ophthalmology Dr.Zamora BAaron Edelmanfor right eye Retinal vein occlusion with macular edema,diabetic retinopathy,Glaucoma,pseudophakia and ocular hypertension.Also saw Dr.Mayer GCaprice Red2/26/2021.Due for urine microAlbumin ratio check.Also due for labs today.     Dementia without behavioral disturbance- not on any memory medication.daughter states patient has no behavioral issues but confused sometimes especially in a new information.also tends to ask question then repeats same question after few minutes.states short memory worsening.she continues to perform own ADL's.Appetite described as good.Has had a one pound weight loss since last visit three months ago.    Health maintenance:  She is due for her Tdap vaccine and Pneumoccocal vaccine 23. Has completed x 2 doses of COVID-19 vaccine.continue to wear a facial mask.    Past Medical History:  Diagnosis Date  . Chicken pox   . Cystocele   . Diabetes mellitus without complication ([place]   . Glaucoma   . Hypertension   . Hypertensive retinopathy    OU  . Neuromuscular disorder ([place]    Dementia   . Thyroid disease    hypothyroidism   Past Surgical History:  Procedure Laterality Date  . ABDOMINAL HYSTERECTOMY  1990   TAH BSO  . CATARACT EXTRACTION Bilateral    Dr. WKathlen Mody . EYE SURGERY Bilateral    Cat Sx OU  . OOPHORECTOMY     BSO  . Vaginal Bx     Papilloma    Allergies  Allergen Reactions  . Donepezil Hcl     Allergies as of 03/13/2020      Reactions  Donepezil Hcl       Medication List       Accurate as of March 13, 2020  1:27 PM. If you have any questions, ask your nurse or doctor.        STOP taking these medications   metFORMIN 500 MG 24 hr tablet Commonly known as: GLUCOPHAGE-XR Stopped by: [name], NP     TAKE these medications   amLODipine 10 MG tablet Commonly known as: NORVASC Take 1 tablet (10 mg  total) by mouth daily.   dorzolamide-timolol 22.3-6.8 MG/ML ophthalmic solution Commonly known as: COSOPT Place 1 drop into the right eye 2 (two) times daily.   levothyroxine 50 MCG tablet Commonly known as: SYNTHROID Take 50 mcg by mouth every morning.   metoprolol succinate 50 MG 24 hr tablet Commonly known as: TOPROL-XL TAKE 1 TABLET(50 MG) BY MOUTH DAILY       Review of Systems  Constitutional: Negative for appetite change, chills, fatigue and fever.  HENT: Positive for hearing loss. Negative for congestion, ear discharge, ear pain, rhinorrhea, sinus pressure, sinus pain, sneezing, sore throat and trouble swallowing.        Wears upper dentures   Eyes: Positive for visual disturbance. Negative for pain, discharge, redness and itching.       Wears eye glasses follows up with Ophthalmology.  Respiratory: Negative for cough, chest tightness, shortness of breath and wheezing.   Cardiovascular: Negative for chest pain, palpitations and leg swelling.  Gastrointestinal: Negative for abdominal distention, abdominal pain, blood in stool, constipation, diarrhea, nausea and vomiting.  Endocrine: Negative for cold intolerance, heat intolerance, polydipsia, polyphagia and polyuria.  Genitourinary: Negative for difficulty urinating, dysuria, flank pain, frequency, hematuria, urgency and vaginal bleeding.  Musculoskeletal: Negative for arthralgias, gait problem and myalgias.  Skin: Negative for color change, pallor, rash and wound.  Neurological: Negative for dizziness, speech difficulty, weakness, light-headedness, numbness and headaches.  Hematological: Does not bruise/bleed easily.  Psychiatric/Behavioral: Negative for agitation, behavioral problems, hallucinations and sleep disturbance. The patient is not nervous/anxious.        Some confusion at times per daughter especially with new information. Repeats question frequently.    Immunization History  Administered Date(s) Administered   . Influenza, High Dose Seasonal PF 12/20/2017, 09/25/2019  . Influenza-Unspecified 12/20/2017  . PFIZER SARS-COV-2 Vaccination 01/05/2020, 02/05/2020  . Zoster Recombinat (Shingrix) 02/07/2018, 05/18/2018   Pertinent  Health Maintenance Due  Topic Date Due  . PNA vac Low Risk Adult (1 of 2 - PCV13) Never done  . URINE MICROALBUMIN  09/07/2019  . HEMOGLOBIN A1C  09/13/2019  . OPHTHALMOLOGY EXAM  12/18/2020  . FOOT EXAM  02/22/2021  . INFLUENZA VACCINE  Completed  . DEXA SCAN  Completed   Fall Risk  03/13/2020 11/16/2019 11/10/2016  Falls in the past year? 0 0 No  Number falls in past yr: 0 0 -  Injury with Fall? 0 0 -    Vitals:   03/13/20 1310  BP: 110/62  Pulse: 63  Temp: 97.7 F (36.5 C)  TempSrc: Tympanic  SpO2: 98%  Weight: 123 lb (55.8 kg)  Height: 5' 3"  (1.6 m)   Body mass index is 21.79 kg/m. Physical Exam Vitals reviewed.  Constitutional:      General: She is not in acute distress.    Appearance: She is normal weight. She is not ill-appearing or diaphoretic.  HENT:     Head: Normocephalic.     Right Ear: Tympanic membrane, ear canal and external ear normal.  There is no impacted cerumen.     Left Ear: Tympanic membrane, ear canal and external ear normal. There is no impacted cerumen.     Nose: Nose normal. No congestion or rhinorrhea.     Mouth/Throat:     Mouth: Mucous membranes are moist.     Pharynx: Oropharynx is clear. No oropharyngeal exudate or posterior oropharyngeal erythema.  Eyes:     General: No scleral icterus.       Right eye: No discharge.        Left eye: No discharge.     Extraocular Movements: Extraocular movements intact.     Conjunctiva/sclera: Conjunctivae normal.     Pupils: Pupils are equal, round, and reactive to light.  Neck:     Vascular: No carotid bruit.  Cardiovascular:     Rate and Rhythm: Normal rate and regular rhythm.     Pulses: Normal pulses.     Heart sounds: Murmur present. No friction rub. No gallop.    Pulmonary:     Effort: Pulmonary effort is normal. No respiratory distress.     Breath sounds: Normal breath sounds. No wheezing, rhonchi or rales.  Chest:     Chest wall: No tenderness.  Abdominal:     General: Bowel sounds are normal. There is no distension.     Palpations: Abdomen is soft. There is no mass.     Tenderness: There is no abdominal tenderness. There is no right CVA tenderness, left CVA tenderness or guarding.  Musculoskeletal:        General: No swelling or tenderness. Normal range of motion.     Cervical back: Normal range of motion. No rigidity or tenderness.     Comments: Bilateral lower extremities 1+ edema   Lymphadenopathy:     Cervical: No cervical adenopathy.  Skin:    General: Skin is warm and dry.     Coloration: Skin is not pale.     Findings: No bruising, erythema or rash.  Neurological:     Mental Status: She is alert. Mental status is at baseline.     Cranial Nerves: No cranial nerve deficit.     Sensory: No sensory deficit.     Motor: No weakness.     Coordination: Coordination normal.     Gait: Gait normal.  Psychiatric:        Mood and Affect: Mood normal.        Speech: Speech normal.        Behavior: Behavior normal.        Thought Content: Thought content normal.        Cognition and Memory: She exhibits impaired recent memory.        Judgment: Judgment normal.    Labs reviewed: Recent Labs    05/01/19 2212 05/01/19 2231 09/16/19 1134  NA 140  --  138  K 3.8  --  4.2  CL 104  --  103  CO2 25  --  25  GLUCOSE 115*  --  146*  BUN 14  --  13  CREATININE 1.13* 1.10* 0.94  CALCIUM 9.9  --  9.6   Recent Labs    05/01/19 2212 09/16/19 1354  AST 21 20  ALT 17 17  ALKPHOS 69 63  BILITOT 0.6 0.8  PROT 7.8 7.9  ALBUMIN 4.0 3.9   Recent Labs    05/01/19 2212 09/16/19 1134  WBC 5.7 6.9  NEUTROABS 3.6  --   HGB 13.6 13.8  HCT 42.2 42.0  MCV 82.9 83.5  PLT 271 245   Lab Results  Component Value Date   TSH 1.32 03/13/2019    Lab Results  Component Value Date   HGBA1C 6.9 (H) 03/13/2019   Lab Results  Component Value Date   CHOL 180 09/06/2018   HDL 74.60 09/06/2018   LDLCALC 90 09/06/2018   TRIG 76.0 09/06/2018   CHOLHDL 2 09/06/2018    Significant Diagnostic Results in last 30 days:  Intravitreal Injection, Pharmacologic Agent - OD - Right Eye  Result Date: 03/06/2020 Time Out 03/06/2020. 3:24 PM. Confirmed correct patient, procedure, site, and patient consented. Anesthesia Topical anesthesia was used. Anesthetic medications included Lidocaine 2%, Proparacaine 0.5%. Procedure Preparation included 5% betadine to ocular surface, eyelid speculum. A supplied needle was used. Injection: 1.25 mg Bevacizumab (AVASTIN) SOLN   NDC: 40981-191-47, Lot: [id number] , Expiration date: 04/23/2020   Route: Intravitreal, Site: Right Eye, Waste: 0 mL Post-op Post injection exam found visual acuity of at least counting fingers. The patient tolerated the procedure well. There were no complications. The patient received written and verbal post procedure care education.   OCT, Retina - OU - Both Eyes  Result Date: 03/06/2020 Right Eye Quality was good. Central Foveal Thickness: 267. Progression has improved. Findings include no SRF, epiretinal membrane, normal foveal contour, intraretinal fluid (Blunted foveal contour, interval improvement in IRF/cystic changes--temporal macula). Left Eye Quality was good. Central Foveal Thickness: 264. Progression has been stable. Findings include abnormal foveal contour, epiretinal membrane, no IRF, no SRF, vitreomacular adhesion  (Blunted foveal depression). Notes *Images captured and stored on drive Diagnosis / Impression: ERM OU OD: blunted foveal contour, interval improvement in IRF/cystic changes--temporal macula OS: very mild ERM with blunted foveal depression -- stable; mild VMA Clinical management: See below Abbreviations: NFP - Normal foveal profile. CME - cystoid macular edema. PED - pigment  epithelial detachment. IRF - intraretinal fluid. SRF - subretinal fluid. EZ - ellipsoid zone. ERM - epiretinal membrane. ORA - outer retinal atrophy. ORT - outer retinal tubulation. SRHM - subretinal hyper-reflective material   Assessment/Plan 1. Essential hypertension B/p at goal.continue on on metoprolol succinate 50 mg 24 Hr  Tablet daily and Amlodipine 10 mg tablet daily.Not on statin or ASA - Microalbumin/Creatinine Ratio, Urine - CBC with Differential/Platelet - CMP with eGFR([facility]) - Lipid Panel  2. Controlled type 2 diabetes mellitus without complication, without long-term current use of insulin (HCC) Lab Results  Component Value Date   HGBA1C 6.9 (H) 03/13/2019  Metformin XR 500 mg 24 Hr tablet discontinued by previous PCP due to weight loss.diet controlled. - encouraged dietary and exercise. - Annual eye and foot exam up to date.  - Microalbumin/Creatinine Ratio, Urine - Lipid Panel - Hemoglobin A1c  3. Acquired hypothyroidism Lab Results  Component Value Date   TSH 1.32 03/13/2019  continue on  levothyroxine 50 mcg tablet daily on empty stomach. - TSH  4. Dementia without behavioral disturbance, unspecified dementia type ([place]) No new behavioral issues.continue with supportive care.  5. Need for Tdap vaccination Last Tdap over 10 yrs.  - Advised to get Tdap vaccine at her pharmacy.  - Tdap (BOOSTRIX) 5-2.5-18.5 LF-MCG/0.5 injection; Inject 0.5 mLs into the muscle once for 1 dose.  Dispense: 0.5 mL; Refill: 0  6. Need for 23-polyvalent pneumococcal polysaccharide vaccine Has never had Pneu-vac.Afebrile.  - Pneumococcal polysaccharide vaccine 23-valent greater than or equal to 2yo subcutaneous/IM administered by CMA.   7. Bilateral leg edema Has 1+ edema to bilateral lower extremities.No cough,wheezing or shortness of  breath.No abrupt weight gain. - encouraged to keep legs elevated whenever she is seated. - Advised to wear knee high compression stockings on in the  morning and remove it at bedtime.  - Compression stockings  Family/ staff Communication: Reviewed plan of care with patient and daughter Daleysa Kristiansen verbalized understanding.   Labs/tests ordered:  - Microalbumin/Creatinine Ratio, Urine - CBC with Differential/Platelet - CMP with eGFR([facility]) - Lipid Panel  Next Appointment : 6 months for medical management of chronic issues.   [name], NP

## 2020-03-13 NOTE — Patient Instructions (Addendum)
-   Please get your tetanus vaccine at your pharmacy  - please wear compression stockings on in the morning and off at bedtime for leg swelling

## 2020-03-14 LAB — CBC WITH DIFFERENTIAL/PLATELET
Absolute Monocytes: 462 cells/uL (ref 200–950)
Basophils Absolute: 40 cells/uL (ref 0–200)
Basophils Relative: 0.7 %
Eosinophils Absolute: 268 cells/uL (ref 15–500)
Eosinophils Relative: 4.7 %
HCT: 42.9 % (ref 35.0–45.0)
Hemoglobin: 13.8 g/dL (ref 11.7–15.5)
Lymphs Abs: 1590 cells/uL (ref 850–3900)
MCH: 26.7 pg — ABNORMAL LOW (ref 27.0–33.0)
MCHC: 32.2 g/dL (ref 32.0–36.0)
MCV: 83.1 fL (ref 80.0–100.0)
MPV: 10.8 fL (ref 7.5–12.5)
Monocytes Relative: 8.1 %
Neutro Abs: 3340 cells/uL (ref 1500–7800)
Neutrophils Relative %: 58.6 %
Platelets: 148 10*3/uL (ref 140–400)
RBC: 5.16 10*6/uL — ABNORMAL HIGH (ref 3.80–5.10)
RDW: 15.7 % — ABNORMAL HIGH (ref 11.0–15.0)
Total Lymphocyte: 27.9 %
WBC: 5.7 10*3/uL (ref 3.8–10.8)

## 2020-03-14 LAB — MICROALBUMIN / CREATININE URINE RATIO
Creatinine, Urine: 76 mg/dL (ref 20–275)
Microalb Creat Ratio: 204 mcg/mg creat — ABNORMAL HIGH (ref <30)
Microalb, Ur: 15.5 mg/dL

## 2020-03-14 LAB — COMPLETE METABOLIC PANEL WITH GFR
AG Ratio: 1.2 (calc) (ref 1.0–2.5)
ALT: 12 U/L (ref 6–29)
AST: 19 U/L (ref 10–35)
Albumin: 4.3 g/dL (ref 3.6–5.1)
Alkaline phosphatase (APISO): 62 U/L (ref 37–153)
BUN/Creatinine Ratio: 17 (calc) (ref 6–22)
BUN: 20 mg/dL (ref 7–25)
CO2: 24 mmol/L (ref 20–32)
Calcium: 10.1 mg/dL (ref 8.6–10.4)
Chloride: 105 mmol/L (ref 98–110)
Creat: 1.18 mg/dL — ABNORMAL HIGH (ref 0.60–0.88)
GFR, Est African American: 50 mL/min/{1.73_m2} — ABNORMAL LOW (ref >=60)
GFR, Est Non African American: 43 mL/min/{1.73_m2} — ABNORMAL LOW (ref >=60)
Globulin: 3.5 g/dL (calc) (ref 1.9–3.7)
Glucose, Bld: 99 mg/dL (ref 65–99)
Potassium: 4.4 mmol/L (ref 3.5–5.3)
Sodium: 141 mmol/L (ref 135–146)
Total Bilirubin: 0.4 mg/dL (ref 0.2–1.2)
Total Protein: 7.8 g/dL (ref 6.1–8.1)

## 2020-03-14 LAB — LIPID PANEL
Cholesterol: 238 mg/dL — ABNORMAL HIGH (ref <200)
HDL: 85 mg/dL (ref >=50)
LDL Cholesterol (Calc): 131 mg/dL (calc) — ABNORMAL HIGH
Non-HDL Cholesterol (Calc): 153 mg/dL (calc) — ABNORMAL HIGH (ref <130)
Total CHOL/HDL Ratio: 2.8 (calc) (ref <5.0)
Triglycerides: 113 mg/dL (ref <150)

## 2020-03-14 LAB — HEMOGLOBIN A1C
Hgb A1c MFr Bld: 6.7 % of total Hgb — ABNORMAL HIGH (ref <5.7)
Mean Plasma Glucose: 146 (calc)
eAG (mmol/L): 8.1 (calc)

## 2020-03-14 LAB — TSH: TSH: 5 mIU/L — ABNORMAL HIGH (ref 0.40–4.50)

## 2020-03-15 ENCOUNTER — Ambulatory Visit: Payer: Medicare Other | Admitting: Family

## 2020-03-15 MED ORDER — LISINOPRIL 2.5 MG PO TABS: 2.5000 mg | ORAL_TABLET | Freq: Every day | ORAL | 0 refills | Status: DC

## 2020-03-15 NOTE — Telephone Encounter (Signed)
Janett Billow, this pt's daughter called and wanted a response to her mychart message, she has some concerns

## 2020-03-15 NOTE — Telephone Encounter (Signed)
Spoke with daughter and advised results. rx sent to pharmacy by e-script  

## 2020-03-18 NOTE — Telephone Encounter (Signed)
Good Morning [name], In-home care service for Alejandra Davis will be a good idea but will need to check with her  Insurance whether they cover for meal preparation and grocery shopping.some patient  Have had  to be pay out of pocket. There several home healthy Agencies that have Nurse assistant and personal care assistants to help with activity of daily living. There also day program for seniors. [facility] is one of the Agencies that provides a wide variety of care.we do have a brochure at our office or you can call them at [phone].  Thank You, Yissel Habermehl FNP-C

## 2020-03-20 DIAGNOSIS — I10 Essential (primary) hypertension: Secondary | ICD-10-CM

## 2020-03-20 MED ORDER — AMLODIPINE BESYLATE 10 MG PO TABS: 10.0000 mg | ORAL_TABLET | Freq: Every day | ORAL | 1 refills | Status: DC

## 2020-03-20 MED ORDER — LEVOTHYROXINE SODIUM 50 MCG PO TABS: 50.0000 ug | ORAL_TABLET | Freq: Every morning | ORAL | 1 refills | Status: DC

## 2020-04-09 DIAGNOSIS — H04123 Dry eye syndrome of bilateral lacrimal glands: Secondary | ICD-10-CM | POA: Diagnosis not present

## 2020-04-09 DIAGNOSIS — H401133 Primary open-angle glaucoma, bilateral, severe stage: Secondary | ICD-10-CM | POA: Diagnosis not present

## 2020-04-09 DIAGNOSIS — H26493 Other secondary cataract, bilateral: Secondary | ICD-10-CM | POA: Diagnosis not present

## 2020-04-15 NOTE — Progress Notes (Signed)
[facility] Note  04/17/2020     CHIEF COMPLAINT Patient presents for Retina Follow Up   HISTORY OF PRESENT ILLNESS: Alejandra Davis is a 84 y.o. female who presents to the clinic today for:   HPI    Retina Follow Up    Patient presents with  CRVO/BRVO.  In right eye.  This started months ago.  Severity is moderate.  Duration of 6 weeks.  Since onset it is gradually worsening.  I, the attending physician,  performed the HPI with the patient and updated documentation appropriately.          Comments    84 y/o female pt here for 6 wk f/u for BRVO w/mac edema OD.  Has not noticed any change in [place] OU.  Denies pain, FOL, floaters.  Cosopt BID OD.  BS and A1C unknown.       Last edited by [name], MD on 04/17/2020 10:35 AM. (History)    pt states she does not feel like there is any change in vision in her right eye   Referring physician: [name], NP [place],  [place] [address]  HISTORICAL INFORMATION:   Selected notes from the MEDICAL RECORD NUMBER Referred by [name] for DM exam    CURRENT MEDICATIONS: Current Outpatient Medications (Ophthalmic Drugs)  Medication Sig  . dorzolamide-timolol (COSOPT) 22.3-6.8 MG/ML ophthalmic solution Place 1 drop into the right eye 2 (two) times daily.   No current facility-administered medications for this visit. (Ophthalmic Drugs)   Current Outpatient Medications (Other)  Medication Sig  . amLODipine (NORVASC) 10 MG tablet Take 1 tablet (10 mg total) by mouth daily.  Marland Kitchen levothyroxine (SYNTHROID) 50 MCG tablet Take 1 tablet (50 mcg total) by mouth every morning.  Marland Kitchen lisinopril (ZESTRIL) 2.5 MG tablet Take 1 tablet (2.5 mg total) by mouth daily.  . metoprolol succinate (TOPROL-XL) 50 MG 24 hr tablet TAKE 1 TABLET(50 MG) BY MOUTH DAILY   No current facility-administered medications for this visit. (Other)      REVIEW OF SYSTEMS: ROS    Positive for: Skin, Musculoskeletal,  Endocrine, Eyes   Negative for: Constitutional, Gastrointestinal, Neurological, Genitourinary, HENT, Cardiovascular, Respiratory, Psychiatric, Allergic/Imm, Heme/Lymph   Last edited by Matthew Folks, COA on 04/17/2020  9:46 AM. (History)       ALLERGIES Allergies  Allergen Reactions  . Donepezil Hcl     PAST MEDICAL HISTORY Past Medical History:  Diagnosis Date  . Chicken pox   . Cystocele   . Diabetes mellitus without complication ([place])   . Glaucoma   . Hypertension   . Hypertensive retinopathy    OU  . Neuromuscular disorder ([place])    Dementia   . Thyroid disease    hypothyroidism   Past Surgical History:  Procedure Laterality Date  . ABDOMINAL HYSTERECTOMY  1990   TAH BSO  . CATARACT EXTRACTION Bilateral    Dr. [name]  . EYE SURGERY Bilateral    Cat Sx OU  . OOPHORECTOMY     BSO  . Vaginal Bx     Papilloma    FAMILY HISTORY Family History  Problem Relation Age of Onset  . Sickle cell anemia Other   . Hypertension Mother   . Cancer Father        LIVER  . Heart disease Brother   . Cancer Brother        STOMACH    SOCIAL HISTORY Social History  Tobacco Use  . Smoking status: Never Smoker  . Smokeless tobacco: Never Used  Substance Use Topics  . Alcohol use: No  . Drug use: No         OPHTHALMIC EXAM:  Base Eye Exam    Visual Acuity (Snellen - Linear)      Right Left   Dist cc 20/80 + 20/30 -2   Dist ph cc NI 20/25 -   Correction: Glasses       Tonometry (Tonopen, 9:48 AM)      Right Left   Pressure 13 15       Pupils      Dark Light Shape React APD   Right 3 2 Round Minimal None   Left 3 2 Round Minimal None       Visual Fields (Counting fingers)      Left Right    Full Full       Extraocular Movement      Right Left    Full, Ortho Full, Ortho       Neuro/Psych    Oriented x3: Yes   Mood/Affect: Normal       Dilation    Both eyes: 1.0% Mydriacyl, 2.5% Phenylephrine @ 9:48 AM        Slit Lamp and Fundus Exam     Slit Lamp Exam      Right Left   Lids/Lashes Dermatochalasis - upper lid, Meibomian gland dysfunction Dermatochalasis - upper lid, Meibomian gland dysfunction   Conjunctiva/Sclera Melanosis, no injection Melanosis   Cornea Arcus, 1+ Punctate epithelial erosions, mild Anterior basement membrane dystrophy, Well healed cataract wounds, tr Descemet's folds tempoally Arcus, Inferior 1+ Punctate epithelial erosions; 2-3+ microcystic edema temporal with 2+ descemet folds, Keratic precipitates.  Well healed temporal cat sx incision.   Anterior Chamber Deep, no cell or flare Deep;  no cell or flare   Iris Round and dilated, No NVI Round and dilated, No NVI   Lens PC IOL in good position, trace Posterior capsular opacification Posterior chamber intraocular lens in good position.  1+ PCO.   Vitreous Vitreous syneresis Vitreous syneresis       Fundus Exam      Right Left   Disc Sharp rim, inferior notch, 2-3+ pallor, inf. Rim thinning, attenuated vessels superiorly, superior hyperemia--improved 1-2+ pallor, Sharp rim   C/D Ratio 0.7 0.6   Macula Flat, good foveal reflex, scattered IRH Flat, good foveal reflex, mild RPE mottling and clumping, Epiretinal membrane, No heme or edema   Vessels Severe vascular attenuation superior>inferior, BRVO Mild vascular attenuation, AV crossing changes   Periphery Attached, scattered IRH superior hemisphere Attached, mild, scattered Reticular degeneration        Refraction    Manifest Refraction      Sphere Cylinder Axis Dist VA   Right -0.75 +1.25 170 20/80+   Left              IMAGING AND PROCEDURES  Imaging and Procedures for 03/07/18  OCT, Retina - OU - Both Eyes       Right Eye Quality was good. Central Foveal Thickness: 266. Progression has worsened. Findings include no SRF, epiretinal membrane, normal foveal contour, intraretinal fluid (Blunted foveal contour, mild interval increase in cystic changes).   Left Eye Quality was good. Central  Foveal Thickness: 257. Progression has been stable. Findings include abnormal foveal contour, epiretinal membrane, no IRF, no SRF, vitreomacular adhesion  (Blunted foveal depression).   Notes *Images captured and stored on drive  Diagnosis / Impression:  ERM OU [id number] foveal contour, mild interval increase in cystic changes  OS: very mild ERM with blunted foveal depression -- stable; mild VMA  Clinical management:  See below  Abbreviations: NFP - Normal foveal profile. CME - cystoid macular edema. PED - pigment epithelial detachment. IRF - intraretinal fluid. SRF - subretinal fluid. EZ - ellipsoid zone. ERM - epiretinal membrane. ORA - outer retinal atrophy. ORT - outer retinal tubulation. SRHM - subretinal hyper-reflective material         Intravitreal Injection, Pharmacologic Agent - OD - Right Eye       Time Out 04/17/2020. 9:37 AM. Confirmed correct patient, procedure, site, and patient consented.   Anesthesia Topical anesthesia was used. Anesthetic medications included Lidocaine 2%, Proparacaine 0.5%.   Procedure Preparation included 5% betadine to ocular surface, eyelid speculum. A supplied needle was used.   Injection:  1.25 mg Bevacizumab (AVASTIN) SOLN   NDC: 40981-191-47, Lot: [id number] , Expiration date: 05/30/2020   Route: Intravitreal, Site: Right Eye, Waste: 0 mL  Post-op Post injection exam found visual acuity of at least counting fingers. The patient tolerated the procedure well. There were no complications. The patient received written and verbal post procedure care education.                 ASSESSMENT/PLAN:    ICD-10-CM   1. Branch retinal vein occlusion of right eye with macular edema  H34.8310 Intravitreal Injection, Pharmacologic Agent - OD - Right Eye    Bevacizumab (AVASTIN) SOLN 1.25 mg  2. Diabetes mellitus type 2 without retinopathy ([place])  E11.9   3. Epiretinal membrane (ERM) of both eyes  H35.373   4. Retinal edema  H35.81 OCT,  Retina - OU - Both Eyes  5. Pseudophakia of both eyes  Z96.1   6. Ocular hypertension of right eye  H40.051   7. Glaucoma suspect of right eye  H40.001   8. Essential hypertension  I10   9. Hypertensive retinopathy of both eyes  H35.033   10. Headache above the eye region  R51.9     1. BRVO with CME OD  - at presentation, BCVA 20/60 OD -- down from 20/30  - initial OCT w/ CME superior macula  - s/p IVA OD #1 (10.18.19), #2 (11.19.19), #3 (12.17.19), #4 (02.19.20), #5 (03.23.20), #6 (04.21.20), #7 (05.27.20), #8 (07.07.20), #9 (08.25.20), #10 (9.29.20), #11 (11.17.20), #12 (12.22.20), #13 (01.27.21), #14 (03.10.21)  - good response to medications, but held IVA in January 2020 due to cataract surgery  - today, BCVA decreased to 20/80 from 20/40, OCT shows mild interval increase in cystic changes at 6 wk interval again  - of note, dec in BCVA out of proportion to OCT changes   - recommend IVA OD #15 today, 04.21.21   - pt wishes to proceed  - RBA of procedure discussed, questions answered  - informed consent obtained   - Avastin informed consent form signed and scanned on 01.27.2021  - see procedure note  - history of interval increase in IRF/cystic changes at 6+ weeks interval -- once again, BCVA is decreased and IRF is increased on OCT at 6 week interval   - will probably need to maintain 5 wk interval long term for maintenance  - f/u 5 weeks -- DFE/OCT, possible injection  2. Diabetes mellitus, type 2 without retinopathy  - The incidence, risk factors for progression, natural history and treatment options for diabetic retinopathy  were discussed with patient.    - The  need for close monitoring of blood glucose, blood pressure, and serum lipids, avoiding cigarette or any type of tobacco, and the need for long term follow up was also discussed with patient.  3,4. Epiretinal membrane, OU  - relatively mild ERM OU -- blunted central foveal depression  - OD with mild cystic changes 2/2  BRVO as above; OS without cystic changes or edema  - discussed findings and prognosis  - recommend monitoring for now  5. Pseudophakia OU  - s/p CE/IOL OS 11.13.19 w/ Dr. [name]  - s/p CE/IOL OD 01.16.20 w/ Dr. [name]  - beautiful surgeries -- IOLs in perfect position  - healing well post-operatively  - IRF/CME OD may have been partly due to post op CME ([place]) in addition to BRVO  6,7. Ocular hypertension / Glaucoma suspect OD>OS  - IOP ~25 OD, 20 OS at prior clinic visits  - today IOP 13 OD, OS is 15 -- on cosopt BID OD  - denies any family hx of glaucoma  - under the expert care of Dr. [name]  - cont Cosopt BID OD  8-10. Hypertensive retinopathy OU w/ symptomatic headache  - pt presented to 6.24.2020 visit urgently for right sided headache above right eye with extension to occiput  - BP at that time was 201/100 -- pt was sent to primary care clinic for urgent evaluation and meds were adjusted  - BP now under better control and headaches improved  - discussed importance of tight BP control   Ophthalmic Meds Ordered this visit:  Meds ordered this encounter  Medications  . Bevacizumab (AVASTIN) SOLN 1.25 mg       Return in about 5 weeks (around 05/22/2020) for f/u BRVO with CME OD, DFE, OCT.  There are no Patient Instructions on file for this visit.  Gardiner Sleeper, M.D., Ph.D. Diseases & Surgery of the Retina and [place] 04/17/2020   I have reviewed the above documentation for accuracy and completeness, and I agree with the above. Gardiner Sleeper, M.D., Ph.D. 04/17/20 11:46 AM    Abbreviations: M myopia (nearsighted); A astigmatism; H hyperopia (farsighted); P presbyopia; Mrx spectacle prescription;  CTL contact lenses; OD right eye; OS left eye; OU both eyes  XT exotropia; ET esotropia; PEK punctate epithelial keratitis; PEE punctate epithelial erosions; DES dry eye syndrome; MGD meibomian gland dysfunction; ATs artificial  tears; PFAT's preservative free artificial tears; [place] nuclear sclerotic cataract; PSC posterior subcapsular cataract; ERM epi-retinal membrane; PVD posterior vitreous detachment; RD retinal detachment; DM diabetes mellitus; DR diabetic retinopathy; NPDR non-proliferative diabetic retinopathy; PDR proliferative diabetic retinopathy; CSME clinically significant macular edema; DME diabetic macular edema; dbh dot blot hemorrhages; CWS cotton wool spot; POAG primary open angle glaucoma; C/D cup-to-disc ratio; HVF humphrey visual field; GVF goldmann visual field; OCT optical coherence tomography; IOP intraocular pressure; BRVO Branch retinal vein occlusion; CRVO central retinal vein occlusion; CRAO central retinal artery occlusion; BRAO branch retinal artery occlusion; RT retinal tear; SB scleral buckle; PPV pars plana vitrectomy; VH Vitreous hemorrhage; PRP panretinal laser photocoagulation; IVK intravitreal kenalog; VMT vitreomacular traction; MH Macular hole;  NVD neovascularization of the disc; NVE neovascularization elsewhere; AREDS age related eye disease study; ARMD age related macular degeneration; POAG primary open angle glaucoma; EBMD epithelial/anterior basement membrane dystrophy; ACIOL anterior chamber intraocular lens; IOL intraocular lens; PCIOL posterior chamber intraocular lens; Phaco/IOL phacoemulsification with intraocular lens placement; [place] photorefractive keratectomy; LASIK laser assisted in situ keratomileusis; HTN hypertension; DM diabetes mellitus; COPD chronic obstructive  pulmonary disease

## 2020-04-17 ENCOUNTER — Ambulatory Visit (INDEPENDENT_AMBULATORY_CARE_PROVIDER_SITE_OTHER): Payer: Medicare PPO | Admitting: Ophthalmology

## 2020-04-17 ENCOUNTER — Encounter (INDEPENDENT_AMBULATORY_CARE_PROVIDER_SITE_OTHER): Payer: Self-pay | Admitting: Ophthalmology

## 2020-04-17 DIAGNOSIS — H40051 Ocular hypertension, right eye: Secondary | ICD-10-CM

## 2020-04-17 DIAGNOSIS — R519 Headache, unspecified: Secondary | ICD-10-CM

## 2020-04-17 DIAGNOSIS — H34831 Tributary (branch) retinal vein occlusion, right eye, with macular edema: Secondary | ICD-10-CM | POA: Diagnosis not present

## 2020-04-17 DIAGNOSIS — Z961 Presence of intraocular lens: Secondary | ICD-10-CM

## 2020-04-17 DIAGNOSIS — H40001 Preglaucoma, unspecified, right eye: Secondary | ICD-10-CM

## 2020-04-17 DIAGNOSIS — H3581 Retinal edema: Secondary | ICD-10-CM

## 2020-04-17 DIAGNOSIS — H35373 Puckering of macula, bilateral: Secondary | ICD-10-CM | POA: Diagnosis not present

## 2020-04-17 DIAGNOSIS — I10 Essential (primary) hypertension: Secondary | ICD-10-CM

## 2020-04-17 DIAGNOSIS — E119 Type 2 diabetes mellitus without complications: Secondary | ICD-10-CM

## 2020-04-17 DIAGNOSIS — H35033 Hypertensive retinopathy, bilateral: Secondary | ICD-10-CM

## 2020-04-17 DIAGNOSIS — H25813 Combined forms of age-related cataract, bilateral: Secondary | ICD-10-CM

## 2020-04-17 MED ORDER — BEVACIZUMAB CHEMO INJECTION 1.25MG/0.05ML SYRINGE FOR KALEIDOSCOPE
1.2500 mg | INTRAVITREAL | Status: AC | PRN
  Administered 2020-04-17: 1.25 mg via INTRAVITREAL

## 2020-04-18 ENCOUNTER — Encounter (INDEPENDENT_AMBULATORY_CARE_PROVIDER_SITE_OTHER): Payer: Self-pay | Admitting: Ophthalmology

## 2020-05-14 NOTE — Progress Notes (Signed)
[facility] Note  05/22/2020     CHIEF COMPLAINT Patient presents for Retina Follow Up   HISTORY OF PRESENT ILLNESS: Alejandra Davis is a 84 y.o. female who presents to the clinic today for:   HPI    Retina Follow Up    Patient presents with  CRVO/BRVO.  In right eye.  This started 5 weeks ago.  Severity is moderate.  I, the attending physician,  performed the HPI with the patient and updated documentation appropriately.          Comments    Patient here for 5 weeks retinal follow up for BRVO OD. Patient states vision seems ok. No eye pain.       Last edited by [name], MD on 05/23/2020  3:30 PM. (History)    pt states she does not feel like there is any change in vision in her right eye   Referring physician: [name], NP [place],  [place] [address]  HISTORICAL INFORMATION:   Selected notes from the MEDICAL RECORD NUMBER Referred by [name] for DM exam    CURRENT MEDICATIONS: Current Outpatient Medications (Ophthalmic Drugs)  Medication Sig  . dorzolamide-timolol (COSOPT) 22.3-6.8 MG/ML ophthalmic solution Place 1 drop into the right eye 2 (two) times daily.   No current facility-administered medications for this visit. (Ophthalmic Drugs)   Current Outpatient Medications (Other)  Medication Sig  . Accu-Chek FastClix Lancets MISC Use to test blood sugar once daily and record. Dx: E11.9  . amLODipine (NORVASC) 10 MG tablet Take 1 tablet (10 mg total) by mouth daily.  . Blood Glucose Monitoring Suppl (ACCU-CHEK AVIVA CONNECT) w/Device KIT Check blood sugars once daily and record.  DX E11.9  . glucose blood test strip Use to test blood sugar once daily. DX E11.9  . levothyroxine (SYNTHROID) 50 MCG tablet Take 1 tablet (50 mcg total) by mouth every morning.  Marland Kitchen lisinopril (ZESTRIL) 2.5 MG tablet Take 1 tablet (2.5 mg total) by mouth daily.  . metoprolol succinate (TOPROL-XL) 50 MG 24 hr tablet TAKE 1 TABLET(50 MG)  BY MOUTH DAILY   No current facility-administered medications for this visit. (Other)      REVIEW OF SYSTEMS: ROS    Positive for: Skin, Musculoskeletal, Endocrine, Eyes   Negative for: Constitutional, Gastrointestinal, Neurological, Genitourinary, HENT, Cardiovascular, Respiratory, Psychiatric, Allergic/Imm, Heme/Lymph   Last edited by [name], COA on 05/22/2020 10:17 AM. (History)       ALLERGIES Allergies  Allergen Reactions  . Donepezil Hcl     PAST MEDICAL HISTORY Past Medical History:  Diagnosis Date  . Chicken pox   . Cystocele   . Diabetes mellitus without complication ([place])   . Glaucoma   . Hypertension   . Hypertensive retinopathy    OU  . Neuromuscular disorder ([place])    Dementia   . Thyroid disease    hypothyroidism   Past Surgical History:  Procedure Laterality Date  . ABDOMINAL HYSTERECTOMY  1990   TAH BSO  . CATARACT EXTRACTION Bilateral    Dr. [name]  . EYE SURGERY Bilateral    Cat Sx OU  . OOPHORECTOMY     BSO  . Vaginal Bx     Papilloma    FAMILY HISTORY Family History  Problem Relation Age of Onset  . Sickle cell anemia Other   . Hypertension Mother   . Cancer Father        LIVER  .  Heart disease Brother   . Cancer Brother        STOMACH    SOCIAL HISTORY Social History   Tobacco Use  . Smoking status: Never Smoker  . Smokeless tobacco: Never Used  Substance Use Topics  . Alcohol use: No  . Drug use: No         OPHTHALMIC EXAM:  Base Eye Exam    Visual Acuity (Snellen - Linear)      Right Left   Dist cc 20/40 20/30 -1   Dist ph cc NI NI       Tonometry (Tonopen, 10:15 AM)      Right Left   Pressure 15 14       Pupils      Dark Light Shape React APD   Right 3 2 Round Minimal None   Left 3 2 Round Minimal None       Visual Fields (Counting fingers)      Left Right    Full Full       Extraocular Movement      Right Left    Full, Ortho Full, Ortho       Neuro/Psych    Oriented x3: Yes    Mood/Affect: Normal       Dilation    Both eyes: 1.0% Mydriacyl, 2.5% Phenylephrine @ 10:15 AM        Slit Lamp and Fundus Exam    Slit Lamp Exam      Right Left   Lids/Lashes Dermatochalasis - upper lid, Meibomian gland dysfunction Dermatochalasis - upper lid, Meibomian gland dysfunction   Conjunctiva/Sclera Melanosis, no injection Melanosis   Cornea Arcus, 1+ Punctate epithelial erosions, mild Anterior basement membrane dystrophy, Well healed cataract wounds, tr Descemet's folds tempoally Arcus, Inferior 1+ Punctate epithelial erosions; 2-3+ microcystic edema temporal with 2+ descemet folds, Keratic precipitates.  Well healed temporal cat sx incision.   Anterior Chamber Deep, no cell or flare Deep;  no cell or flare   Iris Round and dilated, No NVI Round and dilated, No NVI   Lens PC IOL in good position, trace Posterior capsular opacification Posterior chamber intraocular lens in good position.  1+ PCO.   Vitreous Vitreous syneresis Vitreous syneresis       Fundus Exam      Right Left   Disc Sharp rim, inferior notch, 2-3+ pallor, inf. Rim thinning, attenuated vessels superiorly, superior hyperemia--improved 1-2+ pallor, Sharp rim   C/D Ratio 0.7 0.6   Macula Flat, good foveal reflex, mild IRH temporal macula Flat, good foveal reflex, mild RPE mottling and clumping, Epiretinal membrane, No heme or edema   Vessels Severe vascular attenuation greatest ST arcades, superior>inferior, BRVO Mild vascular attenuation, AV crossing changes   Periphery Attached, scattered IRH superior hemisphere Attached, mild, scattered Reticular degeneration        Refraction    Wearing Rx      Sphere Cylinder Axis Add   Right -0.75 +1.25 170 +2.50   Left -0.75 +1.00 003 +2.50   Type: PAL          IMAGING AND PROCEDURES  Imaging and Procedures for 03/07/18  OCT, Retina - OU - Both Eyes       Right Eye Quality was good. Central Foveal Thickness: 260. Progression has improved. Findings  include no SRF, epiretinal membrane, normal foveal contour, intraretinal fluid (Interval improvement in IRF/cystic changes ).   Left Eye Quality was good. Central Foveal Thickness: 259. Progression has been stable. Findings include abnormal  foveal contour, epiretinal membrane, no IRF, no SRF, vitreomacular adhesion  (Blunted foveal depression).   Notes *Images captured and stored on drive  Diagnosis / Impression:  ERM OU OD: Interval improvement in IRF/cystic changes OS: very mild ERM with blunted foveal depression -- stable; mild VMA  Clinical management:  See below  Abbreviations: NFP - Normal foveal profile. CME - cystoid macular edema. PED - pigment epithelial detachment. IRF - intraretinal fluid. SRF - subretinal fluid. EZ - ellipsoid zone. ERM - epiretinal membrane. ORA - outer retinal atrophy. ORT - outer retinal tubulation. SRHM - subretinal hyper-reflective material         Intravitreal Injection, Pharmacologic Agent - OD - Right Eye       Time Out 05/22/2020. 10:12 AM. Confirmed correct patient, procedure, site, and patient consented.   Anesthesia Topical anesthesia was used. Anesthetic medications included Lidocaine 2%, Proparacaine 0.5%.   Procedure Preparation included 5% betadine to ocular surface, eyelid speculum. A supplied needle was used.   Injection:  1.25 mg Bevacizumab (AVASTIN) SOLN   NDC: 74944-967-59, Lot: [id number] , Expiration date: 05/30/2020   Route: Intravitreal, Site: Right Eye, Waste: 0 mL  Post-op Post injection exam found visual acuity of at least counting fingers. The patient tolerated the procedure well. There were no complications. The patient received written and verbal post procedure care education.                 ASSESSMENT/PLAN:    ICD-10-CM   1. Branch retinal vein occlusion of right eye with macular edema  H34.8310 Intravitreal Injection, Pharmacologic Agent - OD - Right Eye    Bevacizumab (AVASTIN) SOLN 1.25 mg  2.  Diabetes mellitus type 2 without retinopathy ([place])  E11.9   3. Epiretinal membrane (ERM) of both eyes  H35.373   4. Retinal edema  H35.81 OCT, Retina - OU - Both Eyes  5. Pseudophakia of both eyes  Z96.1   6. Ocular hypertension of right eye  H40.051   7. Glaucoma suspect of right eye  H40.001   8. Essential hypertension  I10   9. Hypertensive retinopathy of both eyes  H35.033   10. Headache above the eye region  R51.9     1. BRVO with CME OD  - at presentation, BCVA 20/60 OD -- down from 20/30  - initial OCT w/ CME superior macula  - s/p IVA OD #1 (10.18.19), #2 (11.19.19), #3 (12.17.19), #4 (02.19.20), #5 (03.23.20), #6 (04.21.20), #7 (05.27.20), #8 (07.07.20), #9 (08.25.20), #10 (9.29.20), #11 (11.17.20), #12 (12.22.20), #13 (01.27.21), #14 (03.10.21), #15 (04.21.21)  - good response to medications, but held IVA in January 2020 due to cataract surgery  - today, BCVA 20/40, OCT shows interval improvement in cystic changes at 5 wk interval   - recommend IVA OD #16 today, 05.26.21  - pt wishes to proceed  - RBA of procedure discussed, questions answered  - informed consent obtained   - Avastin informed consent form signed and scanned on 01.27.2021  - see procedure note  - history of interval increase in IRF/cystic changes at 6+ weeks interval  - will probably need to maintain 5 wk interval long term for maintenance  - f/u 5 weeks -- DFE/OCT, possible injection  2. Diabetes mellitus, type 2 without retinopathy  - The incidence, risk factors for progression, natural history and treatment options for diabetic retinopathy  were discussed with patient.     - The need for close monitoring of blood glucose, blood pressure, and serum lipids, avoiding cigarette  or any type of tobacco, and the need for long term follow up was also discussed with patient.    3,4. Epiretinal membrane, OU  - relatively mild ERM OU -- blunted central foveal depression  - OD with mild cystic changes 2/2 BRVO as  above; OS without cystic changes or edema  - discussed findings and prognosis  - monitor  5. Pseudophakia OU  - s/p CE/IOL OS 11.13.19 w/ Dr. [name]  - s/p CE/IOL OD 01.16.20 w/ Dr. [name]  - beautiful surgeries -- IOLs in perfect position  - healing well post-operatively  - IRF/CME OD may have been partly due to post op CME ([place]) in addition to BRVO  6,7. Ocular hypertension / Glaucoma suspect OD>OS  - IOP ~25 OD, 20 OS at prior clinic visits  - today IOP 13 OD, OS is 15 -- on cosopt BID OD  - denies any family hx of glaucoma  - under the expert care of Dr. [name]  - cont Cosopt BID OD  8-10. Hypertensive retinopathy OU w/ symptomatic headache  - pt presented to 6.24.2020 visit urgently for right sided headache above right eye with extension to occiput  - BP at that time was 201/100 -- pt was sent to primary care clinic for urgent evaluation and meds were adjusted  - BP now under better control and headaches improved  - discussed importance of tight BP control   Ophthalmic Meds Ordered this visit:  Meds ordered this encounter  Medications  . Bevacizumab (AVASTIN) SOLN 1.25 mg       Return in about 5 weeks (around 06/26/2020) for DFE & OCT.  There are no Patient Instructions on file for this visit.  Gardiner Sleeper, M.D., Ph.D. Diseases & Surgery of the Retina and [place] 05/22/2020   I have reviewed the above documentation for accuracy and completeness, and I agree with the above. Gardiner Sleeper, M.D., Ph.D. 05/23/20 3:51 PM   Abbreviations: M myopia (nearsighted); A astigmatism; H hyperopia (farsighted); P presbyopia; Mrx spectacle prescription;  CTL contact lenses; OD right eye; OS left eye; OU both eyes  XT exotropia; ET esotropia; PEK punctate epithelial keratitis; PEE punctate epithelial erosions; DES dry eye syndrome; MGD meibomian gland dysfunction; ATs artificial tears; PFAT's preservative free artificial tears; [place]  nuclear sclerotic cataract; PSC posterior subcapsular cataract; ERM epi-retinal membrane; PVD posterior vitreous detachment; RD retinal detachment; DM diabetes mellitus; DR diabetic retinopathy; NPDR non-proliferative diabetic retinopathy; PDR proliferative diabetic retinopathy; CSME clinically significant macular edema; DME diabetic macular edema; dbh dot blot hemorrhages; CWS cotton wool spot; POAG primary open angle glaucoma; C/D cup-to-disc ratio; HVF humphrey visual field; GVF goldmann visual field; OCT optical coherence tomography; IOP intraocular pressure; BRVO Branch retinal vein occlusion; CRVO central retinal vein occlusion; CRAO central retinal artery occlusion; BRAO branch retinal artery occlusion; RT retinal tear; SB scleral buckle; PPV pars plana vitrectomy; VH Vitreous hemorrhage; PRP panretinal laser photocoagulation; IVK intravitreal kenalog; VMT vitreomacular traction; MH Macular hole;  NVD neovascularization of the disc; NVE neovascularization elsewhere; AREDS age related eye disease study; ARMD age related macular degeneration; POAG primary open angle glaucoma; EBMD epithelial/anterior basement membrane dystrophy; ACIOL anterior chamber intraocular lens; IOL intraocular lens; PCIOL posterior chamber intraocular lens; Phaco/IOL phacoemulsification with intraocular lens placement; [place] photorefractive keratectomy; LASIK laser assisted in situ keratomileusis; HTN hypertension; DM diabetes mellitus; COPD chronic obstructive pulmonary disease

## 2020-05-22 ENCOUNTER — Ambulatory Visit (INDEPENDENT_AMBULATORY_CARE_PROVIDER_SITE_OTHER): Payer: Medicare PPO | Admitting: Ophthalmology

## 2020-05-22 ENCOUNTER — Other Ambulatory Visit: Payer: Self-pay

## 2020-05-22 ENCOUNTER — Other Ambulatory Visit: Payer: Self-pay | Admitting: *Deleted

## 2020-05-22 ENCOUNTER — Encounter (INDEPENDENT_AMBULATORY_CARE_PROVIDER_SITE_OTHER): Payer: Self-pay | Admitting: Ophthalmology

## 2020-05-22 DIAGNOSIS — H35373 Puckering of macula, bilateral: Secondary | ICD-10-CM | POA: Diagnosis not present

## 2020-05-22 DIAGNOSIS — H3581 Retinal edema: Secondary | ICD-10-CM | POA: Diagnosis not present

## 2020-05-22 DIAGNOSIS — R519 Headache, unspecified: Secondary | ICD-10-CM

## 2020-05-22 DIAGNOSIS — Z961 Presence of intraocular lens: Secondary | ICD-10-CM

## 2020-05-22 DIAGNOSIS — I10 Essential (primary) hypertension: Secondary | ICD-10-CM | POA: Diagnosis not present

## 2020-05-22 DIAGNOSIS — E119 Type 2 diabetes mellitus without complications: Secondary | ICD-10-CM | POA: Diagnosis not present

## 2020-05-22 DIAGNOSIS — H34831 Tributary (branch) retinal vein occlusion, right eye, with macular edema: Secondary | ICD-10-CM

## 2020-05-22 DIAGNOSIS — H40051 Ocular hypertension, right eye: Secondary | ICD-10-CM

## 2020-05-22 DIAGNOSIS — H40001 Preglaucoma, unspecified, right eye: Secondary | ICD-10-CM

## 2020-05-22 DIAGNOSIS — H35033 Hypertensive retinopathy, bilateral: Secondary | ICD-10-CM

## 2020-05-22 MED ORDER — GLUCOSE BLOOD VI STRP: ORAL_STRIP | 0 refills | Status: DC

## 2020-05-22 MED ORDER — ACCU-CHEK FASTCLIX LANCETS MISC: 0 refills | Status: DC

## 2020-05-22 MED ORDER — ACCU-CHEK AVIVA CONNECT W/DEVICE KIT: PACK | 0 refills | Status: DC

## 2020-05-22 NOTE — Telephone Encounter (Signed)
Routed to [facility] NP

## 2020-05-22 NOTE — Telephone Encounter (Signed)
MyChart Message Refill request. Oked per [place].  (helped Gay Filler to refill.)

## 2020-05-22 NOTE — Telephone Encounter (Signed)
Will send a prescription to your pharmacy for Accucheck blood sugar monitor,lancets and strips.check blood sugars once daily and record.

## 2020-05-22 NOTE — Telephone Encounter (Signed)
This encounter was created in error - please disregard.  This encounter was created in error - please disregard.

## 2020-05-23 ENCOUNTER — Encounter (INDEPENDENT_AMBULATORY_CARE_PROVIDER_SITE_OTHER): Payer: Self-pay | Admitting: Ophthalmology

## 2020-05-23 MED ORDER — BEVACIZUMAB CHEMO INJECTION 1.25MG/0.05ML SYRINGE FOR KALEIDOSCOPE
1.2500 mg | INTRAVITREAL | Status: AC | PRN
  Administered 2020-05-23: 1.25 mg via INTRAVITREAL

## 2020-05-29 NOTE — Telephone Encounter (Signed)
Will write letter indicating patient has short term memory issues and needs assistance with cooking due safety precautions.Also need supervision assistance with bath/Shower for fall and safety precaution.

## 2020-06-05 DIAGNOSIS — Z1231 Encounter for screening mammogram for malignant neoplasm of breast: Secondary | ICD-10-CM | POA: Diagnosis not present

## 2020-06-21 ENCOUNTER — Other Ambulatory Visit: Payer: Self-pay

## 2020-06-21 ENCOUNTER — Ambulatory Visit (INDEPENDENT_AMBULATORY_CARE_PROVIDER_SITE_OTHER): Payer: Medicare PPO | Admitting: Family

## 2020-06-21 ENCOUNTER — Encounter: Payer: Self-pay | Admitting: Family

## 2020-06-21 VITALS — BP 120/86 | HR 60 | Temp 97.3°F | Resp 16 | Ht 63.0 in | Wt 123.4 lb

## 2020-06-21 DIAGNOSIS — E039 Hypothyroidism, unspecified: Secondary | ICD-10-CM

## 2020-06-21 DIAGNOSIS — E119 Type 2 diabetes mellitus without complications: Secondary | ICD-10-CM | POA: Diagnosis not present

## 2020-06-21 DIAGNOSIS — I10 Essential (primary) hypertension: Secondary | ICD-10-CM

## 2020-06-21 DIAGNOSIS — N1831 Chronic kidney disease, stage 3a: Secondary | ICD-10-CM | POA: Diagnosis not present

## 2020-06-21 NOTE — Patient Instructions (Addendum)
-   check Blood sugars once daily.Notify provider if blood sugars are less than 75 or more than 200  - Drink orange juice if blood sugar is less than 75

## 2020-06-21 NOTE — Telephone Encounter (Signed)
Need to be 75 and 126. Might be high after she eats.

## 2020-06-21 NOTE — Telephone Encounter (Signed)
Alejandra Davis, Looking forward to seeing Alejandra Davis with her [place] as requested.Will discuss blood sugar monitoring with her. Thank you Roberto Hlavaty FNP-C

## 2020-06-21 NOTE — Progress Notes (Signed)
Provider: Marlowe Sax FNP-C  Jesica Goheen, Nelda Bucks, NP  Patient Care Team: Ocia Simek, Nelda Bucks, NP as PCP - General (Family Medicine)  Extended Emergency Contact Information Primary Emergency Contact: Greenawalt,[name] Address: [address]          [place], [place] [address] [place] Phone: [phone] Relation: Daughter Secondary Emergency Contact: Zenon,Mitchell Address: [address]          [place] [address] [place] Mobile Phone: [phone] Relation: Son  Code Status: Full Code  Goals of care: Advanced Directive information Advanced Directives 06/21/2020  Does Patient Have a Medical Advance Directive? Yes  Type of Advance Directive Living will;Healthcare Power of Attorney  Does patient want to make changes to medical advance directive? No - Patient declined  Copy of [place] in Chart? Yes - validated most recent copy scanned in chart (See row information)  Would patient like information on creating a medical advance directive? -     Chief Complaint  Patient presents with  . Follow-up    Follow up on BP/ Kidney Function    HPI:  Pt is a 84 y.o. female seen today for an acute visit for evaluation of follow up on BP/ Kidney Function.Patient's daughter already send a message allowing [place] to accompany patient to visit. She would like provide to show patient how to use her new home glucometer to check blood sugars at home.she denies any acute issues.Latest lab work reviewed Hg A1C 6.7 improved from previous 6.9 one year ago. Hyperlipidemia - chol 238,TRG 113,LDL 131 not on statin. Hypothyroidism - TSH level slightly high 5.00 reports no symptoms. CKD stage 3 - CR 1.18 slightly up compared to previous level 1.10   Past Medical History:  Diagnosis Date  . Chicken pox   . Cystocele   . Diabetes mellitus without complication ([place])   . Glaucoma   . Hypertension   . Hypertensive retinopathy     OU  . Neuromuscular disorder ([place])    Dementia   . Thyroid disease    hypothyroidism   Past Surgical History:  Procedure Laterality Date  . ABDOMINAL HYSTERECTOMY  1990   TAH BSO  . CATARACT EXTRACTION Bilateral    Dr. [name]  . EYE SURGERY Bilateral    Cat Sx OU  . OOPHORECTOMY     BSO  . Vaginal Bx     Papilloma    Allergies  Allergen Reactions  . Donepezil Hcl     Outpatient Encounter Medications as of 06/21/2020  Medication Sig  . Accu-Chek FastClix Lancets MISC Use to test blood sugar once daily and record. Dx: E11.9  . amLODipine (NORVASC) 10 MG tablet Take 1 tablet (10 mg total) by mouth daily.  . Blood Glucose Monitoring Suppl (ACCU-CHEK AVIVA CONNECT) w/Device KIT Check blood sugars once daily and record.  DX E11.9  . dorzolamide-timolol (COSOPT) 22.3-6.8 MG/ML ophthalmic solution Place 1 drop into the right eye 2 (two) times daily.  Marland Kitchen glucose blood test strip Use to test blood sugar once daily. DX E11.9  . levothyroxine (SYNTHROID) 50 MCG tablet Take 1 tablet (50 mcg total) by mouth every morning.  Marland Kitchen lisinopril (ZESTRIL) 2.5 MG tablet Take 1 tablet (2.5 mg total) by mouth daily.  . metoprolol succinate (TOPROL-XL) 50 MG 24 hr tablet TAKE 1 TABLET(50 MG) BY MOUTH DAILY   No facility-administered encounter medications on file as of 06/21/2020.    Review of Systems  Constitutional: Negative  for appetite change, chills, fatigue and fever.  HENT: Negative for congestion, rhinorrhea, sinus pressure, sinus pain, sneezing and sore throat.   Eyes: Positive for visual disturbance. Negative for discharge, redness and itching.       Wears eye glasses follows up with Opthalmology   Respiratory: Negative for cough, chest tightness, shortness of breath and wheezing.   Cardiovascular: Negative for chest pain, palpitations and leg swelling.  Gastrointestinal: Negative for abdominal distention, abdominal pain, constipation, diarrhea, nausea and vomiting.  Endocrine: Negative  for cold intolerance, heat intolerance, polydipsia, polyphagia and polyuria.  Genitourinary: Negative for decreased urine volume, difficulty urinating, dysuria, flank pain, frequency and urgency.  Musculoskeletal: Negative for arthralgias, gait problem and joint swelling.  Skin: Negative for color change, pallor and rash.  Neurological: Negative for dizziness, speech difficulty, weakness, light-headedness, numbness and headaches.  Hematological: Does not bruise/bleed easily.  Psychiatric/Behavioral: Negative for agitation and sleep disturbance. The patient is not nervous/anxious.     Immunization History  Administered Date(s) Administered  . Influenza, High Dose Seasonal PF 12/20/2017, 09/25/2019  . Influenza-Unspecified 12/20/2017  . PFIZER SARS-COV-2 Vaccination 01/05/2020, 02/05/2020  . Pneumococcal Polysaccharide-23 03/13/2020  . Zoster Recombinat (Shingrix) 02/07/2018, 05/18/2018   Pertinent  Health Maintenance Due  Topic Date Due  . INFLUENZA VACCINE  07/28/2020  . HEMOGLOBIN A1C  09/13/2020  . OPHTHALMOLOGY EXAM  12/18/2020  . FOOT EXAM  02/22/2021  . PNA vac Low Risk Adult (2 of 2 - PCV13) 03/13/2021  . DEXA SCAN  Completed   Fall Risk  06/21/2020 03/13/2020 11/16/2019 11/10/2016  Falls in the past year? 0 0 0 No  Number falls in past yr: 0 0 0 -  Injury with Fall? 0 0 0 -    Vitals:   06/21/20 1054  BP: 120/86  Pulse: 60  Resp: 16  Temp: (!) 97.3 F (36.3 C)  SpO2: 98%  Weight: 123 lb 6.4 oz (56 kg)  Height: 5' 3"  (1.6 m)   Body mass index is 21.86 kg/m. Physical Exam Vitals reviewed.  Constitutional:      General: She is not in acute distress.    Appearance: She is normal weight. She is not ill-appearing.  HENT:     Head: Normocephalic.     Right Ear: Tympanic membrane, ear canal and external ear normal. There is no impacted cerumen.     Left Ear: Tympanic membrane, ear canal and external ear normal. There is no impacted cerumen.     Nose: Nose normal. No  congestion or rhinorrhea.     Mouth/Throat:     Mouth: Mucous membranes are moist.     Pharynx: Oropharynx is clear. No oropharyngeal exudate or posterior oropharyngeal erythema.  Eyes:     General: No scleral icterus.       Right eye: No discharge.        Left eye: No discharge.     Extraocular Movements: Extraocular movements intact.     Conjunctiva/sclera: Conjunctivae normal.     Pupils: Pupils are equal, round, and reactive to light.  Neck:     Vascular: No carotid bruit.  Cardiovascular:     Rate and Rhythm: Normal rate and regular rhythm.     Pulses: Normal pulses.     Heart sounds: Murmur heard.  No friction rub. No gallop.   Pulmonary:     Effort: Pulmonary effort is normal. No respiratory distress.     Breath sounds: Normal breath sounds. No wheezing, rhonchi or rales.  Chest:  Chest wall: No tenderness.  Abdominal:     General: Bowel sounds are normal. There is no distension.     Palpations: Abdomen is soft. There is no mass.     Tenderness: There is no abdominal tenderness. There is no right CVA tenderness, left CVA tenderness, guarding or rebound.  Musculoskeletal:        General: No swelling or tenderness. Normal range of motion.     Cervical back: Normal range of motion. No rigidity or tenderness.     Right lower leg: No edema.     Left lower leg: No edema.  Lymphadenopathy:     Cervical: No cervical adenopathy.  Skin:    General: Skin is warm.     Coloration: Skin is not pale.     Findings: No bruising, erythema, lesion or rash.  Neurological:     Mental Status: She is alert.     Cranial Nerves: No cranial nerve deficit.     Motor: No weakness.  Psychiatric:        Mood and Affect: Mood normal.        Behavior: Behavior normal.        Thought Content: Thought content normal.        Cognition and Memory: Memory is impaired.        Judgment: Judgment normal.     Labs reviewed: Recent Labs    09/16/19 1134 03/13/20 1405  NA 138 141  K 4.2 4.4   CL 103 105  CO2 25 24  GLUCOSE 146* 99  BUN 13 20  CREATININE 0.94 1.18*  CALCIUM 9.6 10.1   Recent Labs    09/16/19 1354 03/13/20 1405  AST 20 19  ALT 17 12  ALKPHOS 63  --   BILITOT 0.8 0.4  PROT 7.9 7.8  ALBUMIN 3.9  --    Recent Labs    09/16/19 1134 03/13/20 1405  WBC 6.9 5.7  NEUTROABS  --  3,340  HGB 13.8 13.8  HCT 42.0 42.9  MCV 83.5 83.1  PLT 245 148   Lab Results  Component Value Date   TSH 5.00 (H) 03/13/2020   Lab Results  Component Value Date   HGBA1C 6.7 (H) 03/13/2020   Lab Results  Component Value Date   CHOL 238 (H) 03/13/2020   HDL 85 03/13/2020   LDLCALC 131 (H) 03/13/2020   TRIG 113 03/13/2020   CHOLHDL 2.8 03/13/2020    Significant Diagnostic Results in last 30 days:  Intravitreal Injection, Pharmacologic Agent - OD - Right Eye  Result Date: 05/23/2020 Time Out 05/22/2020. 10:12 AM. Confirmed correct patient, procedure, site, and patient consented. Anesthesia Topical anesthesia was used. Anesthetic medications included Lidocaine 2%, Proparacaine 0.5%. Procedure Preparation included 5% betadine to ocular surface, eyelid speculum. A supplied needle was used. Injection: 1.25 mg Bevacizumab (AVASTIN) SOLN   NDC: 14970-263-78, Lot: [id number] , Expiration date: 05/30/2020   Route: Intravitreal, Site: Right Eye, Waste: 0 mL Post-op Post injection exam found visual acuity of at least counting fingers. The patient tolerated the procedure well. There were no complications. The patient received written and verbal post procedure care education.   OCT, Retina - OU - Both Eyes  Result Date: 05/23/2020 Right Eye Quality was good. Central Foveal Thickness: 260. Progression has improved. Findings include no SRF, epiretinal membrane, normal foveal contour, intraretinal fluid (Interval improvement in IRF/cystic changes ). Left Eye Quality was good. Central Foveal Thickness: 259. Progression has been stable. Findings include abnormal foveal contour, epiretinal  membrane,  no IRF, no SRF, vitreomacular adhesion  (Blunted foveal depression). Notes *Images captured and stored on drive Diagnosis / Impression: ERM OU OD: Interval improvement in IRF/cystic changes OS: very mild ERM with blunted foveal depression -- stable; mild VMA Clinical management: See below Abbreviations: NFP - Normal foveal profile. CME - cystoid macular edema. PED - pigment epithelial detachment. IRF - intraretinal fluid. SRF - subretinal fluid. EZ - ellipsoid zone. ERM - epiretinal membrane. ORA - outer retinal atrophy. ORT - outer retinal tubulation. SRHM - subretinal hyper-reflective material   Assessment/Plan 1. Essential hypertension B/p at goal.continue on amlodipine 10 mg tablet daily and lisinopril 2.5 mg tablet daily and metoprolol succinate 50 mg 24 Hr tablet daily. - CMP with eGFR([facility]); Future - CBC with Differential/Platelet; Future - TSH; Future - Lipid panel; Future - BMP with eGFR([facility])  2. Controlled type 2 diabetes mellitus without complication, without long-term current use of insulin ([facility]) Lab Results  Component Value Date   HGBA1C 6.7 (H) 03/13/2020  CMA educated patient on how to check blood sugars using her new glucometer patient demonstrated back but use limited due to her cognitive impairment.States has family member who is a Marine scientist will assist her with checking blood sugars.Dietary modification and exercise emphasized.  - check Blood sugars once daily.Notify provider if blood sugars are less than 75 or more than 200  - Drink orange juice if blood sugar is less than 75  - Lipid panel; Future - Hemoglobin A1c; Future 3. Acquired hypothyroidism Lab Results  Component Value Date   TSH 5.00 (H) 03/13/2020  - continue on Levothyroxine 50 mcg tablet daily on empty stomach.   - TSH; Future  4. Stage 3a chronic kidney disease CR 1.18  - continue to avoid nephrotoxins and dose for renal clearance.  - BMP with eGFR([facility])  Family/ staff Communication:  Reviewed plan of care with patient  Labs/tests ordered:  - CMP with eGFR([facility]); Future - CBC with Differential/Platelet; Future - TSH; Future - Lipid panel; Future - BMP with eGFR([facility])  Next Appointment: Has 6 months follow up appointment.will need fasting labs 2-4 days prior to visit.   [name], NP

## 2020-06-22 LAB — BASIC METABOLIC PANEL WITH GFR
BUN/Creatinine Ratio: 13 (calc) (ref 6–22)
BUN: 15 mg/dL (ref 7–25)
CO2: 26 mmol/L (ref 20–32)
Calcium: 9.9 mg/dL (ref 8.6–10.4)
Chloride: 104 mmol/L (ref 98–110)
Creat: 1.18 mg/dL — ABNORMAL HIGH (ref 0.60–0.88)
GFR, Est African American: 50 mL/min/{1.73_m2} — ABNORMAL LOW (ref >=60)
GFR, Est Non African American: 43 mL/min/{1.73_m2} — ABNORMAL LOW (ref >=60)
Glucose, Bld: 114 mg/dL (ref 65–139)
Potassium: 4.8 mmol/L (ref 3.5–5.3)
Sodium: 141 mmol/L (ref 135–146)

## 2020-06-24 NOTE — Progress Notes (Signed)
[facility] Note  06/26/2020     CHIEF COMPLAINT Patient presents for Retina Follow Up   HISTORY OF PRESENT ILLNESS: Alejandra Davis is a 84 y.o. female who presents to the clinic today for:   HPI    Retina Follow Up    Patient presents with  CRVO/BRVO.  In right eye.  This started months ago.  Severity is moderate.  Duration of 5 weeks.  Since onset it is stable.  I, the attending physician,  performed the HPI with the patient and updated documentation appropriately.          Comments    84 y/o female pt here for 5 wk f/u for BRVO w/mac edema OD.  No change in [place] OU noticed.  Denies pain, FOL, floaters.  No gtts.  BS and A1C unknown.       Last edited by [name], MD on 06/28/2020  1:13 AM. (History)       Referring physician: Hortencia Pilar, MD [place],  [place] [address]  HISTORICAL INFORMATION:   Selected notes from the MEDICAL RECORD NUMBER Referred by [name] for DM exam    CURRENT MEDICATIONS: Current Outpatient Medications (Ophthalmic Drugs)  Medication Sig  . dorzolamide-timolol (COSOPT) 22.3-6.8 MG/ML ophthalmic solution Place 1 drop into the right eye 2 (two) times daily. (Patient not taking: Reported on 06/26/2020)   No current facility-administered medications for this visit. (Ophthalmic Drugs)   Current Outpatient Medications (Other)  Medication Sig  . Accu-Chek FastClix Lancets MISC Use to test blood sugar once daily and record. Dx: E11.9  . amLODipine (NORVASC) 10 MG tablet Take 1 tablet (10 mg total) by mouth daily.  . Blood Glucose Monitoring Suppl (ACCU-CHEK AVIVA CONNECT) w/Device KIT Check blood sugars once daily and record.  DX E11.9  . glucose blood test strip Use to test blood sugar once daily. DX E11.9  . levothyroxine (SYNTHROID) 50 MCG tablet Take 1 tablet (50 mcg total) by mouth every morning.  Marland Kitchen lisinopril (ZESTRIL) 2.5 MG tablet Take 1 tablet (2.5 mg total) by mouth daily.   . metoprolol succinate (TOPROL-XL) 50 MG 24 hr tablet TAKE 1 TABLET(50 MG) BY MOUTH DAILY   No current facility-administered medications for this visit. (Other)      REVIEW OF SYSTEMS: ROS    Positive for: Skin, Endocrine, Eyes   Negative for: Constitutional, Gastrointestinal, Neurological, Genitourinary, Musculoskeletal, HENT, Cardiovascular, Respiratory, Psychiatric, Allergic/Imm, Heme/Lymph   Last edited by Matthew Folks, COA on 06/26/2020  3:09 PM. (History)       ALLERGIES Allergies  Allergen Reactions  . Donepezil Hcl     PAST MEDICAL HISTORY Past Medical History:  Diagnosis Date  . Chicken pox   . Cystocele   . Diabetes mellitus without complication ([place])   . Glaucoma   . Hypertension   . Hypertensive retinopathy    OU  . Neuromuscular disorder ([place])    Dementia   . Thyroid disease    hypothyroidism   Past Surgical History:  Procedure Laterality Date  . ABDOMINAL HYSTERECTOMY  1990   TAH BSO  . CATARACT EXTRACTION Bilateral    Dr. [name]  . EYE SURGERY Bilateral    Cat Sx OU  . OOPHORECTOMY     BSO  . Vaginal Bx     Papilloma    FAMILY HISTORY Family History  Problem Relation Age of Onset  . Sickle cell anemia Other   .  Hypertension Mother   . Cancer Father        LIVER  . Heart disease Brother   . Cancer Brother        STOMACH    SOCIAL HISTORY Social History   Tobacco Use  . Smoking status: Never Smoker  . Smokeless tobacco: Never Used  Vaping Use  . Vaping Use: Never used  Substance Use Topics  . Alcohol use: No  . Drug use: No         OPHTHALMIC EXAM:  Base Eye Exam    Visual Acuity (Snellen - Linear)      Right Left   Dist cc 20/30 20/25   Dist ph cc NI NI   Correction: Glasses       Tonometry (Tonopen, 3:13 PM)      Right Left   Pressure 16 17       Pupils      Dark Light Shape React APD   Right 3 2 Round Minimal None   Left 3 2 Round Minimal None       Visual Fields (Counting fingers)      Left  Right    Full Full       Extraocular Movement      Right Left    Full, Ortho Full, Ortho       Neuro/Psych    Oriented x3: Yes   Mood/Affect: Normal       Dilation    Both eyes: 1.0% Mydriacyl, 2.5% Phenylephrine @ 3:12 PM        Slit Lamp and Fundus Exam    Slit Lamp Exam      Right Left   Lids/Lashes Dermatochalasis - upper lid, Meibomian gland dysfunction Dermatochalasis - upper lid, Meibomian gland dysfunction   Conjunctiva/Sclera Melanosis, no injection Melanosis   Cornea Arcus, 1+ Punctate epithelial erosions, mild Anterior basement membrane dystrophy, Well healed cataract wounds, tr Descemet's folds tempoally Arcus, Inferior 1+ Punctate epithelial erosions; 2-3+ microcystic edema temporal with 2+ descemet folds, Keratic precipitates.  Well healed temporal cat sx incision.   Anterior Chamber Deep, no cell or flare Deep;  no cell or flare   Iris Round and dilated, No NVI Round and dilated, No NVI   Lens PC IOL in good position, trace Posterior capsular opacification Posterior chamber intraocular lens in good position.  1+ PCO.   Vitreous Vitreous syneresis Vitreous syneresis       Fundus Exam      Right Left   Disc Sharp rim, inferior notch, 2-3+ pallor, inf. Rim thinning, attenuated vessels superiorly 1-2+ pallor, Sharp rim   C/D Ratio 0.7 0.6   Macula Flat, good foveal reflex, mild IRH temporal macula--improving Flat, good foveal reflex, mild RPE mottling and clumping, Epiretinal membrane, No heme or edema   Vessels Severe vascular attenuation greatest ST arcades, superior>inferior, BRVO Mild vascular attenuation, AV crossing changes   Periphery Attached, scattered IRH superior hemisphere Attached, mild, scattered Reticular degeneration          IMAGING AND PROCEDURES  Imaging and Procedures for 03/07/18  OCT, Retina - OU - Both Eyes       Right Eye Quality was good. Central Foveal Thickness: 263. Progression has improved. Findings include no SRF, epiretinal  membrane, normal foveal contour, intraretinal fluid (Interval improvement in IRF/cystic changes--temporal macula).   Left Eye Quality was good. Central Foveal Thickness: 262. Progression has been stable. Findings include abnormal foveal contour, epiretinal membrane, no IRF, no SRF, vitreomacular adhesion  (Blunted foveal  depression--stable).   Notes *Images captured and stored on drive  Diagnosis / Impression:  ERM OU OD: Interval improvement in IRF/cystic changes temporal macula OS: very mild ERM with blunted foveal depression -- stable; mild VMA  Clinical management:  See below  Abbreviations: NFP - Normal foveal profile. CME - cystoid macular edema. PED - pigment epithelial detachment. IRF - intraretinal fluid. SRF - subretinal fluid. EZ - ellipsoid zone. ERM - epiretinal membrane. ORA - outer retinal atrophy. ORT - outer retinal tubulation. SRHM - subretinal hyper-reflective material         Intravitreal Injection, Pharmacologic Agent - OD - Right Eye       Time Out 06/26/2020. 3:58 PM. Confirmed correct patient, procedure, site, and patient consented.   Anesthesia Topical anesthesia was used. Anesthetic medications included Lidocaine 2%, Proparacaine 0.5%.   Procedure Preparation included 5% betadine to ocular surface, eyelid speculum. A supplied needle was used.   Injection:  1.25 mg Bevacizumab (AVASTIN) SOLN   NDC: 16109-604-54, Lot: [id number] , Expiration date: 08/07/2020   Route: Intravitreal, Site: Right Eye, Waste: 0 mL  Post-op Post injection exam found visual acuity of at least counting fingers. The patient tolerated the procedure well. There were no complications. The patient received written and verbal post procedure care education.                 ASSESSMENT/PLAN:    ICD-10-CM   1. Branch retinal vein occlusion of right eye with macular edema  H34.8310 Intravitreal Injection, Pharmacologic Agent - OD - Right Eye    Bevacizumab (AVASTIN) SOLN 1.25  mg  2. Retinal edema  H35.81 OCT, Retina - OU - Both Eyes  3. Diabetes mellitus type 2 without retinopathy ([place])  E11.9   4. Epiretinal membrane (ERM) of both eyes  H35.373   5. Pseudophakia of both eyes  Z96.1   6. Ocular hypertension of right eye  H40.051   7. Glaucoma suspect of both eyes  H40.003   8. Essential hypertension  I10   9. Hypertensive retinopathy of both eyes  H35.033   10. Headache above the eye region  R51.9     1. BRVO with CME OD  - at presentation, BCVA 20/60 OD -- down from 20/30  - initial OCT w/ CME superior macula  - s/p IVA OD #1 (10.18.19), #2 (11.19.19), #3 (12.17.19), #4 (02.19.20), #5 (03.23.20), #6 (04.21.20), #7 (05.27.20), #8 (07.07.20), #9 (08.25.20), #10 (9.29.20), #11 (11.17.20), #12 (12.22.20), #13 (01.27.21), #14 (03.10.21), #15 (04.21.21), #16 (05.26.21)  - good response to medications, but held IVA in January 2020 due to cataract surgery  - today, BCVA 20/30, OCT shows interval improvement in cystic changes temporal macula at 5 wk interval   - recommend IVA OD #17 today, 06.30.21  - pt wishes to proceed  - RBA of procedure discussed, questions answered  - informed consent obtained   - Avastin informed consent form signed and scanned on 01.27.2021  - see procedure note  - history of interval increase in IRF/cystic changes at 6+ weeks interval  - will probably need to maintain 5 wk interval long term for maintenance  - f/u 5 weeks -- DFE/OCT, possible injection  2. Diabetes mellitus, type 2 without retinopathy  - The incidence, risk factors for progression, natural history and treatment options for diabetic retinopathy  were discussed with patient.     - The need for close monitoring of blood glucose, blood pressure, and serum lipids, avoiding cigarette or any type of tobacco, and the  need for long term follow up was also discussed with patient.    3,4. Epiretinal membrane, OU  - relatively mild ERM OU -- blunted central foveal depression  - OD  with mild cystic changes 2/2 BRVO as above; OS without cystic changes or edema  - discussed findings and prognosis  - monitor  5. Pseudophakia OU  - s/p CE/IOL OS 11.13.19 w/ Dr. [name]  - s/p CE/IOL OD 01.16.20 w/ Dr. [name]  - beautiful surgeries -- IOLs in perfect position  - healing well post-operatively  - IRF/CME OD may have been partly due to post op CME ([place]) in addition to BRVO  6,7. Ocular hypertension / Glaucoma suspect OD>OS  - IOP ~25 OD, 20 OS at prior clinic visits  - today IOP 16 OD, OS is 17  - denies any family hx of glaucoma  - under the expert care of Dr. [name]  - Denies being on any gtts  8-10. Hypertensive retinopathy OU w/ symptomatic headache  - pt presented to 6.24.2020 visit urgently for right sided headache above right eye with extension to occiput  - BP at that time was 201/100 -- pt was sent to primary care clinic for urgent evaluation and meds were adjusted  - BP now under better control and headaches improved  - discussed importance of tight BP control   Ophthalmic Meds Ordered this visit:  Meds ordered this encounter  Medications  . Bevacizumab (AVASTIN) SOLN 1.25 mg       Return in about 5 weeks (around 07/31/2020) for 5 wk f/u for BRVO w/mac edema OD w/DFE/OCT/likely inj. OD.  There are no Patient Instructions on file for this visit.  This document serves as a record of services personally performed by Gardiner Sleeper, MD, PhD. It was created on their behalf by Roselee Nova, COMT. The creation of this record is the provider's dictation and/or activities during the visit.  Electronically signed by: Roselee Nova, COMT 06/28/20 1:17 AM  This document serves as a record of services personally performed by Gardiner Sleeper, MD, PhD. It was created on their behalf by Estill Bakes, COT an ophthalmic technician. The creation of this record is the provider's dictation and/or activities during the visit.    Electronically signed by: Estill Bakes, COT 06/26/20 @ 1:17 AM  Gardiner Sleeper, M.D., Ph.D. Diseases & Surgery of the Retina and Vitreous [facility]  I have reviewed the above documentation for accuracy and completeness, and I agree with the above. Gardiner Sleeper, M.D., Ph.D. 06/28/20 1:17 AM   Abbreviations: M myopia (nearsighted); A astigmatism; H hyperopia (farsighted); P presbyopia; Mrx spectacle prescription;  CTL contact lenses; OD right eye; OS left eye; OU both eyes  XT exotropia; ET esotropia; PEK punctate epithelial keratitis; PEE punctate epithelial erosions; DES dry eye syndrome; MGD meibomian gland dysfunction; ATs artificial tears; PFAT's preservative free artificial tears; Milner nuclear sclerotic cataract; PSC posterior subcapsular cataract; ERM epi-retinal membrane; PVD posterior vitreous detachment; RD retinal detachment; DM diabetes mellitus; DR diabetic retinopathy; NPDR non-proliferative diabetic retinopathy; PDR proliferative diabetic retinopathy; CSME clinically significant macular edema; DME diabetic macular edema; dbh dot blot hemorrhages; CWS cotton wool spot; POAG primary open angle glaucoma; C/D cup-to-disc ratio; HVF humphrey visual field; GVF goldmann visual field; OCT optical coherence tomography; IOP intraocular pressure; BRVO Branch retinal vein occlusion; CRVO central retinal vein occlusion; CRAO central retinal artery occlusion; BRAO branch retinal artery occlusion; RT retinal tear; SB scleral buckle; PPV pars plana  vitrectomy; VH Vitreous hemorrhage; PRP panretinal laser photocoagulation; IVK intravitreal kenalog; VMT vitreomacular traction; MH Macular hole;  NVD neovascularization of the disc; NVE neovascularization elsewhere; AREDS age related eye disease study; ARMD age related macular degeneration; POAG primary open angle glaucoma; EBMD epithelial/anterior basement membrane dystrophy; ACIOL anterior chamber intraocular lens; IOL intraocular lens; PCIOL posterior chamber  intraocular lens; Phaco/IOL phacoemulsification with intraocular lens placement; [place] photorefractive keratectomy; LASIK laser assisted in situ keratomileusis; HTN hypertension; DM diabetes mellitus; COPD chronic obstructive pulmonary disease

## 2020-06-24 NOTE — Telephone Encounter (Signed)
Kidney function declines with age,hypertension and Type 2 Diabetes  so this is expected but should avoid medication like NSAIDs like Aleve,ibuprofen and naproxen which can worsen the kidney function.

## 2020-06-25 ENCOUNTER — Ambulatory Visit: Payer: Medicare PPO | Admitting: Family

## 2020-06-25 ENCOUNTER — Other Ambulatory Visit: Payer: Self-pay

## 2020-06-25 ENCOUNTER — Telehealth: Payer: Self-pay

## 2020-06-25 NOTE — Telephone Encounter (Signed)
Schedule an in office to discuss lab results. We can order a free style glucometer but might not be covered by insurance since her Hgb A1C is not too high and not on medication.Exercise and dietary modification cutting down on carbohydrates and sweets Audery Amel food.

## 2020-06-25 NOTE — Telephone Encounter (Signed)
okay

## 2020-06-25 NOTE — Telephone Encounter (Signed)
Patient daughter 'Sherlyn Hay" was given a call back. She understood and states that she is looking into a home health agency to come out and check glucose for her mother. Patient daughter states that all she'll need is referral from you to have this completed. But patient daughter hasn't found an agency yet. I gave her our fax number [phone] also told her that the agency can fax [place] to get papers completed.

## 2020-06-25 NOTE — Telephone Encounter (Signed)
Patient daughter 'Sherlyn Hay" was called and she had a couple of questions about her mother "Alejandra Davis" labs. Questions are "What medications can be taken instead of NSAID for pain/headaches?" , Patient daughter wants to know if we can send in a simpler glucose monitor for her mother? She was wondering if the Freestyle in the arm was possible? Patient daughter also wanted to know if we had recommendations for at home nursing staff that could come by the check her glucose for her? Patient daughter looked at labs and noticed that Creat levels weren't in normal range and wanted to know if they should be a concern and how to improve those levels if possible? Patient daughter also wanted to know if GFR levels were a concern? Because these levels were also out of normal range at 43 & 50. Patient wants a clear knowledge as to what GFR African American/Non African American stands for and what's the simplest way you can explain this test? Message pend and sent to PCP Ngetich, Dinah C, NP . Please Advise.

## 2020-06-26 ENCOUNTER — Ambulatory Visit (INDEPENDENT_AMBULATORY_CARE_PROVIDER_SITE_OTHER): Payer: Medicare PPO | Admitting: Ophthalmology

## 2020-06-26 ENCOUNTER — Encounter (INDEPENDENT_AMBULATORY_CARE_PROVIDER_SITE_OTHER): Payer: Self-pay | Admitting: Ophthalmology

## 2020-06-26 ENCOUNTER — Other Ambulatory Visit: Payer: Self-pay

## 2020-06-26 DIAGNOSIS — I10 Essential (primary) hypertension: Secondary | ICD-10-CM

## 2020-06-26 DIAGNOSIS — H34831 Tributary (branch) retinal vein occlusion, right eye, with macular edema: Secondary | ICD-10-CM | POA: Diagnosis not present

## 2020-06-26 DIAGNOSIS — Z961 Presence of intraocular lens: Secondary | ICD-10-CM

## 2020-06-26 DIAGNOSIS — H35373 Puckering of macula, bilateral: Secondary | ICD-10-CM | POA: Diagnosis not present

## 2020-06-26 DIAGNOSIS — H35033 Hypertensive retinopathy, bilateral: Secondary | ICD-10-CM | POA: Diagnosis not present

## 2020-06-26 DIAGNOSIS — H3581 Retinal edema: Secondary | ICD-10-CM | POA: Diagnosis not present

## 2020-06-26 DIAGNOSIS — R519 Headache, unspecified: Secondary | ICD-10-CM

## 2020-06-26 DIAGNOSIS — H40051 Ocular hypertension, right eye: Secondary | ICD-10-CM | POA: Diagnosis not present

## 2020-06-26 DIAGNOSIS — H40003 Preglaucoma, unspecified, bilateral: Secondary | ICD-10-CM | POA: Diagnosis not present

## 2020-06-26 DIAGNOSIS — E119 Type 2 diabetes mellitus without complications: Secondary | ICD-10-CM

## 2020-06-28 ENCOUNTER — Encounter (INDEPENDENT_AMBULATORY_CARE_PROVIDER_SITE_OTHER): Payer: Self-pay | Admitting: Ophthalmology

## 2020-06-28 MED ORDER — BEVACIZUMAB CHEMO INJECTION 1.25MG/0.05ML SYRINGE FOR KALEIDOSCOPE
1.2500 mg | INTRAVITREAL | Status: AC | PRN
  Administered 2020-06-28: 1.25 mg via INTRAVITREAL

## 2020-07-29 NOTE — Progress Notes (Signed)
[facility] Note  07/31/2020     CHIEF COMPLAINT Patient presents for Retina Follow Up   HISTORY OF PRESENT ILLNESS: Alejandra Davis is a 84 y.o. female who presents to the clinic today for:   HPI    Retina Follow Up    Patient presents with  CRVO/BRVO.  In right eye.  This started 5 weeks ago.  Severity is moderate.  I, the attending physician,  performed the HPI with the patient and updated documentation appropriately.          Comments    Patient here for 5 weeks retina follow up for BRVO OD. Patient states vision doing OK. No eye pain.        Last edited by [name], MD on 07/31/2020  4:31 PM. (History)       Referring physician: [name], NP [place],  [place] [id number]  HISTORICAL INFORMATION:   Selected notes from the MEDICAL RECORD NUMBER Referred by [name] for DM exam    CURRENT MEDICATIONS: Current Outpatient Medications (Ophthalmic Drugs)  Medication Sig  . dorzolamide-timolol (COSOPT) 22.3-6.8 MG/ML ophthalmic solution Place 1 drop into the right eye 2 (two) times daily. (Patient not taking: Reported on 06/26/2020)   No current facility-administered medications for this visit. (Ophthalmic Drugs)   Current Outpatient Medications (Other)  Medication Sig  . Accu-Chek FastClix Lancets MISC Use to test blood sugar once daily and record. Dx: E11.9  . amLODipine (NORVASC) 10 MG tablet Take 1 tablet (10 mg total) by mouth daily.  . Blood Glucose Monitoring Suppl (ACCU-CHEK AVIVA CONNECT) w/Device KIT Check blood sugars once daily and record.  DX E11.9  . glucose blood test strip Use to test blood sugar once daily. DX E11.9  . levothyroxine (SYNTHROID) 50 MCG tablet Take 1 tablet (50 mcg total) by mouth every morning.  Marland Kitchen lisinopril (ZESTRIL) 2.5 MG tablet Take 1 tablet (2.5 mg total) by mouth daily.  . metoprolol succinate (TOPROL-XL) 50 MG 24 hr tablet TAKE 1 TABLET(50 MG) BY MOUTH DAILY   No current  facility-administered medications for this visit. (Other)      REVIEW OF SYSTEMS: ROS    Positive for: Skin, Musculoskeletal, Endocrine, Eyes   Negative for: Constitutional, Gastrointestinal, Neurological, Genitourinary, HENT, Cardiovascular, Respiratory, Psychiatric, Allergic/Imm, Heme/Lymph   Last edited by [name], COA on 07/31/2020  2:52 PM. (History)       ALLERGIES Allergies  Allergen Reactions  . Donepezil Hcl     PAST MEDICAL HISTORY Past Medical History:  Diagnosis Date  . Chicken pox   . Cystocele   . Diabetes mellitus without complication ([place])   . Glaucoma   . Hypertension   . Hypertensive retinopathy    OU  . Neuromuscular disorder ([place])    Dementia   . Thyroid disease    hypothyroidism   Past Surgical History:  Procedure Laterality Date  . ABDOMINAL HYSTERECTOMY  1990   TAH BSO  . CATARACT EXTRACTION Bilateral    Dr. [name]  . EYE SURGERY Bilateral    Cat Sx OU  . OOPHORECTOMY     BSO  . Vaginal Bx     Papilloma    FAMILY HISTORY Family History  Problem Relation Age of Onset  . Sickle cell anemia Other   . Hypertension Mother   . Cancer Father        LIVER  . Heart disease Brother   . Cancer Brother  STOMACH    SOCIAL HISTORY Social History   Tobacco Use  . Smoking status: Never Smoker  . Smokeless tobacco: Never Used  Vaping Use  . Vaping Use: Never used  Substance Use Topics  . Alcohol use: No  . Drug use: No         OPHTHALMIC EXAM:  Base Eye Exam    Visual Acuity (Snellen - Linear)      Right Left   Dist [place] 20/30 20/50 -2   Dist ph [place] NI 20/25 -1  Patient didn't bring glasses.       Tonometry (Tonopen, 2:49 PM)      Right Left   Pressure 11 12       Pupils      Dark Light Shape React APD   Right 3 2 Round Brisk None   Left 3 2 Round Brisk None       Visual Fields (Counting fingers)      Left Right    Full Full       Extraocular Movement      Right Left    Full, Ortho Full, Ortho        Neuro/Psych    Oriented x3: Yes   Mood/Affect: Normal       Dilation    Both eyes: 1.0% Mydriacyl, 2.5% Phenylephrine @ 2:49 PM        Slit Lamp and Fundus Exam    Slit Lamp Exam      Right Left   Lids/Lashes Dermatochalasis - upper lid, Meibomian gland dysfunction Dermatochalasis - upper lid, Meibomian gland dysfunction   Conjunctiva/Sclera Melanosis, no injection Melanosis   Cornea Arcus, 1+ Punctate epithelial erosions, mild Anterior basement membrane dystrophy, Well healed cataract wounds, tr Descemet's folds tempoally Arcus, Inferior 1+ Punctate epithelial erosions; 2-3+ microcystic edema temporal with 2+ descemet folds, Keratic precipitates.  Well healed temporal cat sx incision.   Anterior Chamber Deep, no cell or flare Deep;  no cell or flare   Iris Round and dilated, No NVI Round and dilated, No NVI   Lens PC IOL in good position, trace Posterior capsular opacification Posterior chamber intraocular lens in good position.  1+ PCO.   Vitreous Vitreous syneresis Vitreous syneresis       Fundus Exam      Right Left   Disc Sharp rim, inferior notch, 2-3+ pallor, inf. Rim thinning, attenuated vessels superiorly 1-2+ pallor, Sharp rim   C/D Ratio 0.7 0.6   Macula Flat, good foveal reflex, mild IRH temporal macula--improving, stable improvement in Cystic changes Flat, good foveal reflex, mild RPE mottling and clumping, Epiretinal membrane, No heme or edema   Vessels Severe vascular attenuation greatest ST arcades, superior>inferior, BRVO Mild vascular attenuation, AV crossing changes   Periphery Attached, scattered IRH superior hemisphere Attached, mild, scattered Reticular degeneration        Refraction    Wearing Rx      Sphere Cylinder Axis Add   Right -0.75 +1.25 170 +2.50   Left -0.75 +1.00 003 +2.50   Type: PAL          IMAGING AND PROCEDURES  Imaging and Procedures for 03/07/18  OCT, Retina - OU - Both Eyes       Right Eye Quality was good. Central  Foveal Thickness: 263. Progression has been stable. Findings include no SRF, epiretinal membrane, normal foveal contour, intraretinal fluid (Stable improvement in IRF/cystic changes--temporal macula).   Left Eye Quality was good. Central Foveal Thickness: 262. Progression has been  stable. Findings include abnormal foveal contour, epiretinal membrane, no IRF, no SRF, vitreomacular adhesion  (Blunted foveal depression--stable).   Notes *Images captured and stored on drive  Diagnosis / Impression:  ERM OU OD: Stable improvement in IRF/cystic changes--temporal macula OS: very mild ERM with blunted foveal depression -- stable; mild VMA  Clinical management:  See below  Abbreviations: NFP - Normal foveal profile. CME - cystoid macular edema. PED - pigment epithelial detachment. IRF - intraretinal fluid. SRF - subretinal fluid. EZ - ellipsoid zone. ERM - epiretinal membrane. ORA - outer retinal atrophy. ORT - outer retinal tubulation. SRHM - subretinal hyper-reflective material         Intravitreal Injection, Pharmacologic Agent - OD - Right Eye       Time Out 07/31/2020. 3:25 PM. Confirmed correct patient, procedure, site, and patient consented.   Anesthesia Topical anesthesia was used. Anesthetic medications included Lidocaine 2%, Proparacaine 0.5%.   Procedure Preparation included 5% betadine to ocular surface, eyelid speculum. A supplied needle was used.   Injection:  1.25 mg Bevacizumab (AVASTIN) SOLN   NDC: 10932-355-73, Lot: [id number] , Expiration date: 08/21/2020   Route: Intravitreal, Site: Right Eye, Waste: 0 mL  Post-op Post injection exam found visual acuity of at least counting fingers. The patient tolerated the procedure well. There were no complications. The patient received written and verbal post procedure care education. Post injection medications were not given.                 ASSESSMENT/PLAN:    ICD-10-CM   1. Branch retinal vein occlusion of right eye  with macular edema  H34.8310 Intravitreal Injection, Pharmacologic Agent - OD - Right Eye    Bevacizumab (AVASTIN) SOLN 1.25 mg  2. Diabetes mellitus type 2 without retinopathy ([place])  E11.9   3. Epiretinal membrane (ERM) of both eyes  H35.373   4. Retinal edema  H35.81 OCT, Retina - OU - Both Eyes  5. Pseudophakia of both eyes  Z96.1   6. Ocular hypertension of right eye  H40.051   7. Glaucoma suspect of both eyes  H40.003   8. Essential hypertension  I10   9. Hypertensive retinopathy of both eyes  H35.033   10. Headache above the eye region  R51.9     1. BRVO with CME OD  - at presentation, BCVA 20/60 OD -- down from 20/30  - initial OCT w/ CME superior macula  - s/p IVA OD #1 (10.18.19), #2 (11.19.19), #3 (12.17.19), #4 (02.19.20), #5 (03.23.20), #6 (04.21.20), #7 (05.27.20), #8 (07.07.20), #9 (08.25.20), #10 (9.29.20), #11 (11.17.20), #12 (12.22.20), #13 (01.27.21), #14 (03.10.21), #15 (04.21.21), #16 (05.26.21), #17 (06.30.21)  - good response to medications, but held IVA in January 2020 due to cataract surgery  - today, BCVA 20/30  - OCT shows stable improvement in cystic changes temporal macula at 5 wk interval   - recommend IVA OD #18 today, 08.04.21 -- maintenance  - pt wishes to proceed  - RBA of procedure discussed, questions answered  - informed consent obtained   - Avastin informed consent form signed and scanned on 01.27.2021  - see procedure note  - history of interval increase in IRF/cystic changes at 6+ weeks interval  - will probably need to maintain 5 wk interval long term for maintenance  - f/u 5 weeks -- DFE/OCT, possible injection  2. Diabetes mellitus, type 2 without retinopathy  - The incidence, risk factors for progression, natural history and treatment options for diabetic retinopathy  were discussed with  patient.     - The need for close monitoring of blood glucose, blood pressure, and serum lipids, avoiding cigarette or any type of tobacco, and the need for  long term follow up was also discussed with patient.   3,4. Epiretinal membrane, OU  - relatively mild ERM OU -- blunted central foveal depression  - OD with mild cystic changes 2/2 BRVO as above; OS without cystic changes or edema  - discussed findings and prognosis   - monitor  5. Pseudophakia OU  - s/p CE/IOL OS 11.13.19 w/ Dr. [name]  - s/p CE/IOL OD 01.16.20 w/ Dr. [name]  - beautiful surgeries -- IOLs in perfect position  - healing well post-operatively  - IRF/CME OD may have been partly due to post op CME ([place]) in addition to BRVO  6,7. Ocular hypertension / Glaucoma suspect OD>OS  - IOP ~25 OD, 20 OS at prior clinic visits  - today IOP 11 OD, OS is 12  - denies any family hx of glaucoma  - under the expert care of Dr. [name]  - Denies being on any gtts  8-10. Hypertensive retinopathy OU w/ symptomatic headache  - pt presented to 6.24.2020 visit urgently for right sided headache above right eye with extension to occiput  - BP at that time was 201/100 -- pt was sent to primary care clinic for urgent evaluation and meds were adjusted  - BP now under better control and headaches improved  - discussed importance of tight BP control   Ophthalmic Meds Ordered this visit:  Meds ordered this encounter  Medications  . Bevacizumab (AVASTIN) SOLN 1.25 mg       Return in about 5 weeks (around 09/04/2020) for f/u BRVO OD, DFE, OCT.  There are no Patient Instructions on file for this visit.  This document serves as a record of services personally performed by Gardiner Sleeper, MD, PhD. It was created on their behalf by Leonie Douglas, an ophthalmic technician. The creation of this record is the provider's dictation and/or activities during the visit.    Electronically signed by: Leonie Douglas COA, 07/31/20  4:34 PM  Gardiner Sleeper, M.D., Ph.D. Diseases & Surgery of the Retina and Vitreous [facility]  I have reviewed the above documentation for  accuracy and completeness, and I agree with the above. Gardiner Sleeper, M.D., Ph.D. 07/31/20 4:34 PM     Abbreviations: M myopia (nearsighted); A astigmatism; H hyperopia (farsighted); P presbyopia; Mrx spectacle prescription;  CTL contact lenses; OD right eye; OS left eye; OU both eyes  XT exotropia; ET esotropia; PEK punctate epithelial keratitis; PEE punctate epithelial erosions; DES dry eye syndrome; MGD meibomian gland dysfunction; ATs artificial tears; PFAT's preservative free artificial tears; [place] nuclear sclerotic cataract; PSC posterior subcapsular cataract; ERM epi-retinal membrane; PVD posterior vitreous detachment; RD retinal detachment; DM diabetes mellitus; DR diabetic retinopathy; NPDR non-proliferative diabetic retinopathy; PDR proliferative diabetic retinopathy; CSME clinically significant macular edema; DME diabetic macular edema; dbh dot blot hemorrhages; CWS cotton wool spot; POAG primary open angle glaucoma; C/D cup-to-disc ratio; HVF humphrey visual field; GVF goldmann visual field; OCT optical coherence tomography; IOP intraocular pressure; BRVO Branch retinal vein occlusion; CRVO central retinal vein occlusion; CRAO central retinal artery occlusion; BRAO branch retinal artery occlusion; RT retinal tear; SB scleral buckle; PPV pars plana vitrectomy; VH Vitreous hemorrhage; PRP panretinal laser photocoagulation; IVK intravitreal kenalog; VMT vitreomacular traction; MH Macular hole;  NVD neovascularization of the disc; NVE neovascularization elsewhere; AREDS age  related eye disease study; ARMD age related macular degeneration; POAG primary open angle glaucoma; EBMD epithelial/anterior basement membrane dystrophy; ACIOL anterior chamber intraocular lens; IOL intraocular lens; PCIOL posterior chamber intraocular lens; Phaco/IOL phacoemulsification with intraocular lens placement; [place] photorefractive keratectomy; LASIK laser assisted in situ keratomileusis; HTN hypertension; DM diabetes  mellitus; COPD chronic obstructive pulmonary disease

## 2020-07-31 ENCOUNTER — Ambulatory Visit (INDEPENDENT_AMBULATORY_CARE_PROVIDER_SITE_OTHER): Payer: Medicare PPO | Admitting: Ophthalmology

## 2020-07-31 ENCOUNTER — Encounter (INDEPENDENT_AMBULATORY_CARE_PROVIDER_SITE_OTHER): Payer: Self-pay | Admitting: Ophthalmology

## 2020-07-31 ENCOUNTER — Other Ambulatory Visit: Payer: Self-pay

## 2020-07-31 DIAGNOSIS — R519 Headache, unspecified: Secondary | ICD-10-CM

## 2020-07-31 DIAGNOSIS — Z961 Presence of intraocular lens: Secondary | ICD-10-CM | POA: Diagnosis not present

## 2020-07-31 DIAGNOSIS — I10 Essential (primary) hypertension: Secondary | ICD-10-CM

## 2020-07-31 DIAGNOSIS — H3581 Retinal edema: Secondary | ICD-10-CM

## 2020-07-31 DIAGNOSIS — H34831 Tributary (branch) retinal vein occlusion, right eye, with macular edema: Secondary | ICD-10-CM | POA: Diagnosis not present

## 2020-07-31 DIAGNOSIS — H40051 Ocular hypertension, right eye: Secondary | ICD-10-CM

## 2020-07-31 DIAGNOSIS — H35373 Puckering of macula, bilateral: Secondary | ICD-10-CM

## 2020-07-31 DIAGNOSIS — H35033 Hypertensive retinopathy, bilateral: Secondary | ICD-10-CM

## 2020-07-31 DIAGNOSIS — E119 Type 2 diabetes mellitus without complications: Secondary | ICD-10-CM | POA: Diagnosis not present

## 2020-07-31 DIAGNOSIS — H40003 Preglaucoma, unspecified, bilateral: Secondary | ICD-10-CM | POA: Diagnosis not present

## 2020-07-31 MED ORDER — BEVACIZUMAB CHEMO INJECTION 1.25MG/0.05ML SYRINGE FOR KALEIDOSCOPE
1.2500 mg | INTRAVITREAL | Status: AC | PRN
  Administered 2020-07-31: 1.25 mg via INTRAVITREAL

## 2020-08-27 NOTE — Progress Notes (Signed)
[facility] Note  08/28/2020     CHIEF COMPLAINT Patient presents for Retina Follow Up   HISTORY OF PRESENT ILLNESS: Alejandra Davis is a 84 y.o. female who presents to the clinic today for:   HPI    Retina Follow Up    Patient presents with  CRVO/BRVO.  In right eye.  This started weeks ago.  Severity is moderate.  Duration of weeks.  Since onset it is stable.  I, the attending physician,  performed the HPI with the patient and updated documentation appropriately.          Comments    Pt states vision is about the same OU.  Pt denies eye pain or discomfort and denies any new or worsening floaters or fol OU.       Last edited by [name], MD on 08/28/2020 11:02 PM. (History)       Referring physician: [name], NP [place],  [place] [address]  HISTORICAL INFORMATION:   Selected notes from the MEDICAL RECORD NUMBER Referred by [name] for DM exam    CURRENT MEDICATIONS: Current Outpatient Medications (Ophthalmic Drugs)  Medication Sig  . dorzolamide-timolol (COSOPT) 22.3-6.8 MG/ML ophthalmic solution Place 1 drop into the right eye 2 (two) times daily. (Patient not taking: Reported on 06/26/2020)   No current facility-administered medications for this visit. (Ophthalmic Drugs)   Current Outpatient Medications (Other)  Medication Sig  . Accu-Chek FastClix Lancets MISC Use to test blood sugar once daily and record. Dx: E11.9  . amLODipine (NORVASC) 10 MG tablet Take 1 tablet (10 mg total) by mouth daily.  . Blood Glucose Monitoring Suppl (ACCU-CHEK AVIVA CONNECT) w/Device KIT Check blood sugars once daily and record.  DX E11.9  . glucose blood test strip Use to test blood sugar once daily. DX E11.9  . levothyroxine (SYNTHROID) 50 MCG tablet Take 1 tablet (50 mcg total) by mouth every morning.  Marland Kitchen lisinopril (ZESTRIL) 2.5 MG tablet Take 1 tablet (2.5 mg total) by mouth daily.  . metoprolol succinate (TOPROL-XL) 50 MG 24  hr tablet TAKE 1 TABLET(50 MG) BY MOUTH DAILY   No current facility-administered medications for this visit. (Other)      REVIEW OF SYSTEMS: ROS    Positive for: Skin, Musculoskeletal, Endocrine, Eyes   Negative for: Constitutional, Gastrointestinal, Neurological, Genitourinary, HENT, Cardiovascular, Respiratory, Psychiatric, Allergic/Imm, Heme/Lymph   Last edited by Doneen Poisson on 08/28/2020  2:06 PM. (History)       ALLERGIES Allergies  Allergen Reactions  . Donepezil Hcl     PAST MEDICAL HISTORY Past Medical History:  Diagnosis Date  . Chicken pox   . Cystocele   . Diabetes mellitus without complication ([place])   . Glaucoma   . Hypertension   . Hypertensive retinopathy    OU  . Neuromuscular disorder ([place])    Dementia   . Thyroid disease    hypothyroidism   Past Surgical History:  Procedure Laterality Date  . ABDOMINAL HYSTERECTOMY  1990   TAH BSO  . CATARACT EXTRACTION Bilateral    Dr. [name]  . EYE SURGERY Bilateral    Cat Sx OU  . OOPHORECTOMY     BSO  . Vaginal Bx     Papilloma    FAMILY HISTORY Family History  Problem Relation Age of Onset  . Sickle cell anemia Other   . Hypertension Mother   . Cancer Father  LIVER  . Heart disease Brother   . Cancer Brother        STOMACH    SOCIAL HISTORY Social History   Tobacco Use  . Smoking status: Never Smoker  . Smokeless tobacco: Never Used  Vaping Use  . Vaping Use: Never used  Substance Use Topics  . Alcohol use: No  . Drug use: No         OPHTHALMIC EXAM:  Base Eye Exam    Visual Acuity (Snellen - Linear)      Right Left   Dist [place] 20/30 -2 20/40 -2   Dist ph [place] NI 20/25 -2       Tonometry (Tonopen, 2:11 PM)      Right Left   Pressure 19 18       Pupils      Dark Light Shape React APD   Right 3 2 Round Brisk 0   Left 3 2 Round Brisk 0       Visual Fields      Left Right    Full Full       Extraocular Movement      Right Left    Full Full        Neuro/Psych    Oriented x3: Yes   Mood/Affect: Normal       Dilation    Both eyes: 1.0% Mydriacyl, 2.5% Phenylephrine @ 2:11 PM        Slit Lamp and Fundus Exam    Slit Lamp Exam      Right Left   Lids/Lashes Dermatochalasis - upper lid, Meibomian gland dysfunction Dermatochalasis - upper lid, Meibomian gland dysfunction   Conjunctiva/Sclera Melanosis, no injection Melanosis   Cornea Arcus, 1+ Punctate epithelial erosions, mild Anterior basement membrane dystrophy, Well healed cataract wounds, tr Descemet's folds tempoally Arcus, Inferior 1+ Punctate epithelial erosions; 2-3+ microcystic edema temporal with 2+ descemet folds, Keratic precipitates.  Well healed temporal cat sx incision.   Anterior Chamber Deep, no cell or flare Deep;  no cell or flare   Iris Round and dilated, No NVI Round and dilated, No NVI   Lens PC IOL in good position, trace Posterior capsular opacification Posterior chamber intraocular lens in good position.  1+ PCO.   Vitreous Vitreous syneresis Vitreous syneresis       Fundus Exam      Right Left   Disc Sharp rim, inferior notch, 2-3+ pallor, inf. Rim thinning, attenuated vessels superiorly 1-2+ pallor, Sharp rim, pink   C/D Ratio 0.7 0.6   Macula Flat, good foveal reflex, mild IRH temporal macula--improving, stable improvement in Cystic changes Flat, good foveal reflex, mild RPE mottling and clumping, Epiretinal membrane, No heme or edema   Vessels Severe vascular attenuation greatest ST arcades, superior>inferior, BRVO Mild vascular attenuation, AV crossing changes   Periphery Attached, scattered IRH superior hemisphere Attached, mild, scattered Reticular degeneration        Refraction    Wearing Rx      Sphere Cylinder Axis Add   Right -0.75 +1.25 170 +2.50   Left -0.75 +1.00 003 +2.50   Type: PAL          IMAGING AND PROCEDURES  Imaging and Procedures for 03/07/18  OCT, Retina - OU - Both Eyes       Right Eye Quality was good. Central  Foveal Thickness: 261. Progression has been stable. Findings include no SRF, epiretinal membrane, normal foveal contour, no IRF (Stable resolution of IRF/cystic changes temporal macula).  Left Eye Quality was good. Central Foveal Thickness: 261. Progression has been stable. Findings include abnormal foveal contour, epiretinal membrane, no IRF, no SRF, vitreomacular adhesion  (Blunted foveal depression--stable).   Notes *Images captured and stored on drive  Diagnosis / Impression:  ERM OU OD: stable resolution of IRF/cystic changes temporal macula OS: very mild ERM with blunted foveal depression -- stable; mild VMA  Clinical management:  See below  Abbreviations: NFP - Normal foveal profile. CME - cystoid macular edema. PED - pigment epithelial detachment. IRF - intraretinal fluid. SRF - subretinal fluid. EZ - ellipsoid zone. ERM - epiretinal membrane. ORA - outer retinal atrophy. ORT - outer retinal tubulation. SRHM - subretinal hyper-reflective material         Intravitreal Injection, Pharmacologic Agent - OD - Right Eye       Time Out 08/28/2020. 3:58 PM. Confirmed correct patient, procedure, site, and patient consented.   Anesthesia Topical anesthesia was used. Anesthetic medications included Lidocaine 2%, Proparacaine 0.5%.   Procedure Preparation included 5% betadine to ocular surface, eyelid speculum. A (32g) needle was used.   Injection:  1.25 mg Bevacizumab (AVASTIN) SOLN   NDC: 70360-001-02, Lot: [id number], Expiration date: 09/09/2020   Route: Intravitreal, Site: Right Eye, Waste: 0.05 mL  Post-op Post injection exam found visual acuity of at least counting fingers. The patient tolerated the procedure well. There were no complications. The patient received written and verbal post procedure care education. Post injection medications were not given.                 ASSESSMENT/PLAN:    ICD-10-CM   1. Branch retinal vein occlusion of right eye with macular edema   H34.8310 Intravitreal Injection, Pharmacologic Agent - OD - Right Eye    Bevacizumab (AVASTIN) SOLN 1.25 mg  2. Retinal edema  H35.81 OCT, Retina - OU - Both Eyes  3. Diabetes mellitus type 2 without retinopathy ([place])  E11.9   4. Epiretinal membrane (ERM) of both eyes  H35.373   5. Pseudophakia of both eyes  Z96.1   6. Ocular hypertension of right eye  H40.051   7. Glaucoma suspect of both eyes  H40.003   8. Essential hypertension  I10   9. Hypertensive retinopathy of both eyes  H35.033     1. BRVO with CME OD  - at presentation, BCVA 20/60 OD -- down from 20/30  - initial OCT w/ CME superior macula  - s/p IVA OD #1 (10.18.19), #2 (11.19.19), #3 (12.17.19), #4 (02.19.20), #5 (03.23.20), #6 (04.21.20), #7 (05.27.20), #8 (07.07.20), #9 (08.25.20), #10 (9.29.20), #11 (11.17.20), #12 (12.22.20), #13 (01.27.21), #14 (03.10.21), #15 (04.21.21), #16 (05.26.21), #17 (06.30.21), #18 (08.04.21)  - good response to medications, but held IVA in January 2020 due to cataract surgery  - today, BCVA 20/30  - OCT shows stable improvement in cystic changes temporal macula at 5 wk interval   - recommend IVA OD #19 today, 09.01.21 -- maintenance  - pt wishes to proceed  - RBA of procedure discussed, questions answered  - informed consent obtained   - Avastin informed consent form signed and scanned on 01.27.2021  - see procedure note  - history of interval increase in IRF/cystic changes at 6+ weeks interval  - will probably need to maintain 5 wk interval long term for maintenance  - f/u 5 weeks -- DFE/OCT, possible injection  2. Diabetes mellitus, type 2 without retinopathy  - The incidence, risk factors for progression, natural history and treatment options for diabetic  retinopathy  were discussed with patient.     - The need for close monitoring of blood glucose, blood pressure, and serum lipids, avoiding cigarette or any type of tobacco, and the need for long term follow up was also discussed with  patient.   3,4. Epiretinal membrane, OU  - relatively mild ERM OU -- blunted central foveal depression  - OD with mild cystic changes 2/2 BRVO as above; OS without cystic changes or edema  - discussed findings and prognosis   - monitor  5. Pseudophakia OU  - s/p CE/IOL OS 11.13.19 w/ Dr. [name]  - s/p CE/IOL OD 01.16.20 w/ Dr. [name]  - beautiful surgeries -- IOLs in perfect position  - healing well post-operatively  - IRF/CME OD may have been partly due to post op CME ([place]) in addition to BRVO  6,7. Ocular hypertension / Glaucoma suspect OD>OS  - IOP ~25 OD, 20 OS at prior clinic visits  - today IOP 19 OD, OS is 18  - denies any family hx of glaucoma  - under the expert care of Dr. [name]  - Uses a "blue top gtt" - did not use today  8-10. Hypertensive retinopathy OU w/ symptomatic headache  - pt presented to 6.24.2020 visit urgently for right sided headache above right eye with extension to occiput  - BP at that time was 201/100 -- pt was sent to primary care clinic for urgent evaluation and meds were adjusted  - BP now under better control and headaches improved  - discussed importance of tight BP control   Ophthalmic Meds Ordered this visit:  Meds ordered this encounter  Medications  . Bevacizumab (AVASTIN) SOLN 1.25 mg       Return in about 5 weeks (around 10/02/2020) for 5 wk f/u for BRVO OD w/DFE/OCT/likely inj. OD.  There are no Patient Instructions on file for this visit.  This document serves as a record of services personally performed by Gardiner Sleeper, MD, PhD. It was created on their behalf by Roselee Nova, COMT. The creation of this record is the provider's dictation and/or activities during the visit.  Electronically signed by: Roselee Nova, COMT 08/28/20 11:04 PM  This document serves as a record of services personally performed by Gardiner Sleeper, MD, PhD. It was created on their behalf by Estill Bakes, COT an ophthalmic technician. The creation  of this record is the provider's dictation and/or activities during the visit.    Electronically signed by: Estill Bakes, COT 9.1.21 @ 11:04 PM  Gardiner Sleeper, M.D., Ph.D. Diseases & Surgery of the Retina and [place] 9.1.21  I have reviewed the above documentation for accuracy and completeness, and I agree with the above. Gardiner Sleeper, M.D., Ph.D. 08/28/20 11:06 PM   Abbreviations: M myopia (nearsighted); A astigmatism; H hyperopia (farsighted); P presbyopia; Mrx spectacle prescription;  CTL contact lenses; OD right eye; OS left eye; OU both eyes  XT exotropia; ET esotropia; PEK punctate epithelial keratitis; PEE punctate epithelial erosions; DES dry eye syndrome; MGD meibomian gland dysfunction; ATs artificial tears; PFAT's preservative free artificial tears; [place] nuclear sclerotic cataract; PSC posterior subcapsular cataract; ERM epi-retinal membrane; PVD posterior vitreous detachment; RD retinal detachment; DM diabetes mellitus; DR diabetic retinopathy; NPDR non-proliferative diabetic retinopathy; PDR proliferative diabetic retinopathy; CSME clinically significant macular edema; DME diabetic macular edema; dbh dot blot hemorrhages; CWS cotton wool spot; POAG primary open angle glaucoma; C/D cup-to-disc ratio; HVF humphrey visual field; GVF goldmann visual  field; OCT optical coherence tomography; IOP intraocular pressure; BRVO Branch retinal vein occlusion; CRVO central retinal vein occlusion; CRAO central retinal artery occlusion; BRAO branch retinal artery occlusion; RT retinal tear; SB scleral buckle; PPV pars plana vitrectomy; VH Vitreous hemorrhage; PRP panretinal laser photocoagulation; IVK intravitreal kenalog; VMT vitreomacular traction; MH Macular hole;  NVD neovascularization of the disc; NVE neovascularization elsewhere; AREDS age related eye disease study; ARMD age related macular degeneration; POAG primary open angle glaucoma; EBMD  epithelial/anterior basement membrane dystrophy; ACIOL anterior chamber intraocular lens; IOL intraocular lens; PCIOL posterior chamber intraocular lens; Phaco/IOL phacoemulsification with intraocular lens placement; [place] photorefractive keratectomy; LASIK laser assisted in situ keratomileusis; HTN hypertension; DM diabetes mellitus; COPD chronic obstructive pulmonary disease

## 2020-08-28 ENCOUNTER — Ambulatory Visit (INDEPENDENT_AMBULATORY_CARE_PROVIDER_SITE_OTHER): Payer: Medicare PPO | Admitting: Ophthalmology

## 2020-08-28 ENCOUNTER — Encounter (INDEPENDENT_AMBULATORY_CARE_PROVIDER_SITE_OTHER): Payer: Self-pay | Admitting: Ophthalmology

## 2020-08-28 ENCOUNTER — Other Ambulatory Visit: Payer: Self-pay

## 2020-08-28 DIAGNOSIS — Z961 Presence of intraocular lens: Secondary | ICD-10-CM

## 2020-08-28 DIAGNOSIS — H40051 Ocular hypertension, right eye: Secondary | ICD-10-CM | POA: Diagnosis not present

## 2020-08-28 DIAGNOSIS — H35373 Puckering of macula, bilateral: Secondary | ICD-10-CM

## 2020-08-28 DIAGNOSIS — E119 Type 2 diabetes mellitus without complications: Secondary | ICD-10-CM

## 2020-08-28 DIAGNOSIS — I10 Essential (primary) hypertension: Secondary | ICD-10-CM

## 2020-08-28 DIAGNOSIS — H3581 Retinal edema: Secondary | ICD-10-CM | POA: Diagnosis not present

## 2020-08-28 DIAGNOSIS — H40003 Preglaucoma, unspecified, bilateral: Secondary | ICD-10-CM

## 2020-08-28 DIAGNOSIS — H35033 Hypertensive retinopathy, bilateral: Secondary | ICD-10-CM

## 2020-08-28 DIAGNOSIS — H34831 Tributary (branch) retinal vein occlusion, right eye, with macular edema: Secondary | ICD-10-CM | POA: Diagnosis not present

## 2020-08-28 MED ORDER — BEVACIZUMAB CHEMO INJECTION 1.25MG/0.05ML SYRINGE FOR KALEIDOSCOPE
1.2500 mg | INTRAVITREAL | Status: AC | PRN
  Administered 2020-08-28: 1.25 mg via INTRAVITREAL

## 2020-09-06 ENCOUNTER — Ambulatory Visit (INDEPENDENT_AMBULATORY_CARE_PROVIDER_SITE_OTHER): Payer: Medicare PPO | Admitting: Family

## 2020-09-06 ENCOUNTER — Other Ambulatory Visit: Payer: Self-pay

## 2020-09-06 ENCOUNTER — Encounter: Payer: Self-pay | Admitting: Family

## 2020-09-06 VITALS — BP 130/90 | HR 64 | Temp 97.8°F | Resp 16 | Ht 63.0 in | Wt 122.6 lb

## 2020-09-06 DIAGNOSIS — R600 Localized edema: Secondary | ICD-10-CM | POA: Diagnosis not present

## 2020-09-06 DIAGNOSIS — R410 Disorientation, unspecified: Secondary | ICD-10-CM

## 2020-09-06 DIAGNOSIS — E119 Type 2 diabetes mellitus without complications: Secondary | ICD-10-CM | POA: Diagnosis not present

## 2020-09-06 LAB — CBC WITH DIFFERENTIAL/PLATELET
Absolute Monocytes: 464 cells/uL (ref 200–950)
Basophils Absolute: 49 cells/uL (ref 0–200)
Basophils Relative: 0.9 %
Eosinophils Absolute: 184 cells/uL (ref 15–500)
Eosinophils Relative: 3.4 %
HCT: 42.4 % (ref 35.0–45.0)
Hemoglobin: 13.4 g/dL (ref 11.7–15.5)
Lymphs Abs: 1669 cells/uL (ref 850–3900)
MCH: 26.9 pg — ABNORMAL LOW (ref 27.0–33.0)
MCHC: 31.6 g/dL — ABNORMAL LOW (ref 32.0–36.0)
MCV: 85 fL (ref 80.0–100.0)
MPV: 10.2 fL (ref 7.5–12.5)
Monocytes Relative: 8.6 %
Neutro Abs: 3035 cells/uL (ref 1500–7800)
Neutrophils Relative %: 56.2 %
Platelets: 274 10*3/uL (ref 140–400)
RBC: 4.99 10*6/uL (ref 3.80–5.10)
RDW: 14.9 % (ref 11.0–15.0)
Total Lymphocyte: 30.9 %
WBC: 5.4 10*3/uL (ref 3.8–10.8)

## 2020-09-06 LAB — POCT URINALYSIS DIPSTICK
Bilirubin, UA: NEGATIVE
Glucose, UA: NEGATIVE
Ketones, UA: NEGATIVE
Nitrite, UA: NEGATIVE
Protein, UA: POSITIVE — AB
Spec Grav, UA: 1.015 (ref 1.010–1.025)
Urobilinogen, UA: 1 E.U./dL
pH, UA: 6.5 (ref 5.0–8.0)

## 2020-09-06 NOTE — Progress Notes (Signed)
Provider: Marlowe Sax FNP-C  Allisen Pidgeon, Nelda Bucks, NP  Patient Care Team: Osamu Olguin, Nelda Bucks, NP as PCP - General (Family Medicine)  Extended Emergency Contact Information Primary Emergency Contact: Halpin,[name] Address: [address]          [place], [place] [address] [place] Phone: [phone] Relation: Daughter Secondary Emergency Contact: Beecher,Mitchell Address: [address]          [place] [address] [place] Mobile Phone: [phone] Relation: Son  Code Status:  Full Code  Goals of care: Advanced Directive information Advanced Directives 09/06/2020  Does Patient Have a Medical Advance Directive? Yes  Type of Paramedic of [place];Living will  Does patient want to make changes to medical advance directive? No - Patient declined  Copy of [place] in Chart? Yes - validated most recent copy scanned in chart (See row information)  Would patient like information on creating a medical advance directive? -     Chief Complaint  Patient presents with   Acute Visit    Glucose levels and confusion.    HPI:  Pt is a 84 y.o. female seen today for an acute visit for evaluation of increased confusion.she thought she was going to look for her mother at [place] went off.She also called and stated one of the family member who passed  Away 21 yr ago. Patient does not recall this incidences.she states eats at least twice daily.she eats out most of the time. POA visit at least once a week.Has a care giver three time per week. She denies any signs or symptoms of hypo/hyperglycemia.Also denies any fever,chills or cough.  Daughter states patient unable to check her blood sugars at home and family don't know how to use the glucometer.Her latest Hgb A1C was improved 5 months ago 6.7 from 6.9  She has a significant medical history of type 2 DM and Dementia.   Past Medical History:   Diagnosis Date   Chicken pox    Cystocele    Diabetes mellitus without complication (HCC)    Glaucoma    Hypertension    Hypertensive retinopathy    OU   Neuromuscular disorder ([place])    Dementia    Thyroid disease    hypothyroidism   Past Surgical History:  Procedure Laterality Date   ABDOMINAL HYSTERECTOMY  1990   TAH BSO   CATARACT EXTRACTION Bilateral    Dr. [name]   EYE SURGERY Bilateral    Cat Sx OU   OOPHORECTOMY     BSO   Vaginal Bx     Papilloma    Allergies  Allergen Reactions   Donepezil Hcl     Outpatient Encounter Medications as of 09/06/2020  Medication Sig   amLODipine (NORVASC) 10 MG tablet Take 1 tablet (10 mg total) by mouth daily.   levothyroxine (SYNTHROID) 50 MCG tablet Take 1 tablet (50 mcg total) by mouth every morning.   lisinopril (ZESTRIL) 2.5 MG tablet Take 1 tablet (2.5 mg total) by mouth daily.   metoprolol succinate (TOPROL-XL) 50 MG 24 hr tablet TAKE 1 TABLET(50 MG) BY MOUTH DAILY   [DISCONTINUED] Accu-Chek FastClix Lancets MISC Use to test blood sugar once daily and record. Dx: E11.9   [DISCONTINUED] Blood Glucose Monitoring Suppl (ACCU-CHEK AVIVA CONNECT) w/Device KIT Check blood sugars once daily and record.  DX E11.9   [DISCONTINUED] dorzolamide-timolol (COSOPT) 22.3-6.8 MG/ML ophthalmic solution Place 1 drop into the right eye 2 (  two) times daily. (Patient not taking: Reported on 06/26/2020)   [DISCONTINUED] glucose blood test strip Use to test blood sugar once daily. DX E11.9   No facility-administered encounter medications on file as of 09/06/2020.    Review of Systems  Constitutional: Negative for appetite change, chills, fatigue and fever.  HENT: Negative for congestion, rhinorrhea, sinus pressure, sinus pain, sneezing and sore throat.   Respiratory: Negative for cough, chest tightness, shortness of breath and wheezing.   Cardiovascular: Positive for leg swelling. Negative for chest pain and palpitations.   Gastrointestinal: Negative for abdominal distention, abdominal pain, constipation, diarrhea, nausea and vomiting.  Endocrine: Negative for cold intolerance, heat intolerance, polydipsia, polyphagia and polyuria.  Genitourinary: Negative for difficulty urinating, dysuria, flank pain, frequency and urgency.  Musculoskeletal: Negative for arthralgias, gait problem and joint swelling.  Skin: Negative for color change, pallor and rash.  Neurological: Negative for dizziness, speech difficulty, weakness, light-headedness, numbness and headaches.  Hematological: Does not bruise/bleed easily.  Psychiatric/Behavioral: Positive for confusion. Negative for agitation, behavioral problems and sleep disturbance. The patient is not nervous/anxious.     Immunization History  Administered Date(s) Administered   Influenza, High Dose Seasonal PF 12/20/2017, 09/25/2019   Influenza-Unspecified 12/20/2017   PFIZER SARS-COV-2 Vaccination 01/05/2020, 02/05/2020   Pneumococcal Polysaccharide-23 03/13/2020   Zoster Recombinat (Shingrix) 02/07/2018, 05/18/2018   Pertinent  Health Maintenance Due  Topic Date Due   INFLUENZA VACCINE  07/28/2020   HEMOGLOBIN A1C  09/13/2020   OPHTHALMOLOGY EXAM  12/18/2020   FOOT EXAM  02/22/2021   PNA vac Low Risk Adult (2 of 2 - PCV13) 03/13/2021   DEXA SCAN  Completed   Fall Risk  09/06/2020 06/21/2020 03/13/2020 11/16/2019 11/10/2016  Falls in the past year? 0 0 0 0 No  Number falls in past yr: 0 0 0 0 -  Injury with Fall? 0 0 0 0 -     Vitals:   09/06/20 1526  BP: 130/90  Pulse: 64  Resp: 16  Temp: 97.8 F (36.6 C)  SpO2: 98%  Weight: 122 lb 9.6 oz (55.6 kg)  Height: 5' 3"  (1.6 m)   Body mass index is 21.72 kg/m. Physical Exam Vitals reviewed.  Constitutional:      General: She is not in acute distress.    Appearance: She is not ill-appearing.  HENT:     Head: Normocephalic.  Eyes:     General: No scleral icterus.       Right eye: No  discharge.        Left eye: No discharge.     Conjunctiva/sclera: Conjunctivae normal.     Pupils: Pupils are equal, round, and reactive to light.  Cardiovascular:     Rate and Rhythm: Normal rate and regular rhythm.     Pulses: Normal pulses.     Heart sounds: Normal heart sounds. No murmur heard.  No friction rub. No gallop.   Pulmonary:     Effort: Pulmonary effort is normal. No respiratory distress.     Breath sounds: Normal breath sounds. No wheezing, rhonchi or rales.  Chest:     Chest wall: No tenderness.  Abdominal:     General: Bowel sounds are normal. There is no distension.     Palpations: Abdomen is soft. There is no mass.     Tenderness: There is no abdominal tenderness. There is no right CVA tenderness, left CVA tenderness, guarding or rebound.  Musculoskeletal:        General: No swelling or tenderness. Normal range  of motion.     Right lower leg: Edema present.     Left lower leg: Edema present.     Comments: Bilateral lower extremities trace -1+ Edema   Skin:    General: Skin is warm.     Coloration: Skin is not pale.     Findings: No bruising, erythema or rash.  Neurological:     Mental Status: She is alert. Mental status is at baseline.     Cranial Nerves: No cranial nerve deficit.     Sensory: No sensory deficit.     Motor: No weakness.     Coordination: Coordination normal.     Gait: Gait normal.  Psychiatric:        Mood and Affect: Mood normal.        Speech: Speech normal.        Behavior: Behavior normal.        Thought Content: Thought content normal.        Judgment: Judgment normal.     Labs reviewed: Recent Labs    09/16/19 1134 03/13/20 1405 06/21/20 1200  NA 138 141 141  K 4.2 4.4 4.8  CL 103 105 104  CO2 25 24 26   GLUCOSE 146* 99 114  BUN 13 20 15   CREATININE 0.94 1.18* 1.18*  CALCIUM 9.6 10.1 9.9   Recent Labs    09/16/19 1354 03/13/20 1405  AST 20 19  ALT 17 12  ALKPHOS 63  --   BILITOT 0.8 0.4  PROT 7.9 7.8  ALBUMIN  3.9  --    Recent Labs    09/16/19 1134 03/13/20 1405  WBC 6.9 5.7  NEUTROABS  --  3,340  HGB 13.8 13.8  HCT 42.0 42.9  MCV 83.5 83.1  PLT 245 148   Lab Results  Component Value Date   TSH 5.00 (H) 03/13/2020   Lab Results  Component Value Date   HGBA1C 6.7 (H) 03/13/2020   Lab Results  Component Value Date   CHOL 238 (H) 03/13/2020   HDL 85 03/13/2020   LDLCALC 131 (H) 03/13/2020   TRIG 113 03/13/2020   CHOLHDL 2.8 03/13/2020    Significant Diagnostic Results in last 30 days:  Intravitreal Injection, Pharmacologic Agent - OD - Right Eye  Result Date: 08/28/2020 Time Out 08/28/2020. 3:58 PM. Confirmed correct patient, procedure, site, and patient consented. Anesthesia Topical anesthesia was used. Anesthetic medications included Lidocaine 2%, Proparacaine 0.5%. Procedure Preparation included 5% betadine to ocular surface, eyelid speculum. A (32g) needle was used. Injection: 1.25 mg Bevacizumab (AVASTIN) SOLN   NDC: 70360-001-02, Lot: [id number], Expiration date: 09/09/2020   Route: Intravitreal, Site: Right Eye, Waste: 0.05 mL Post-op Post injection exam found visual acuity of at least counting fingers. The patient tolerated the procedure well. There were no complications. The patient received written and verbal post procedure care education. Post injection medications were not given.   OCT, Retina - OU - Both Eyes  Result Date: 08/28/2020 Right Eye Quality was good. Central Foveal Thickness: 261. Progression has been stable. Findings include no SRF, epiretinal membrane, normal foveal contour, no IRF (Stable resolution of IRF/cystic changes temporal macula). Left Eye Quality was good. Central Foveal Thickness: 261. Progression has been stable. Findings include abnormal foveal contour, epiretinal membrane, no IRF, no SRF, vitreomacular adhesion  (Blunted foveal depression--stable). Notes *Images captured and stored on drive Diagnosis / Impression: ERM OU OD: stable resolution of IRF/cystic  changes temporal macula OS: very mild ERM with blunted foveal depression -- stable;  mild VMA Clinical management: See below Abbreviations: NFP - Normal foveal profile. CME - cystoid macular edema. PED - pigment epithelial detachment. IRF - intraretinal fluid. SRF - subretinal fluid. EZ - ellipsoid zone. ERM - epiretinal membrane. ORA - outer retinal atrophy. ORT - outer retinal tubulation. SRHM - subretinal hyper-reflective material   Assessment/Plan 1. Controlled type 2 diabetes mellitus without complication, without long-term current use of insulin (HCC) Lab Results  Component Value Date   HGBA1C 6.7 (H) 03/13/2020  Unable to check CBG at home due to memory issues. - Advised daughter for POA or care giver to check CBG at least once a week for now and record. Glucometer Education provided to Daughter today by CMA  - Notify provider if blood sugar > 200   2. Confusion Afebrile.  - CBC with Differential/Platelet - Urine dip stick showed large leukocytes,trace blood but negative for nitrites.will send for urine culture. Advised to increase water intake.  Family/ staff Communication: Reviewed plan of care with patient and daughter   Labs/tests ordered:   - CBC with Differential/Platelet - Urine dip stick   Next Appointment: As needed if symptoms worsen or fail to improve   [name], NP

## 2020-09-06 NOTE — Patient Instructions (Addendum)
-   Family /Care giver to check  blood sugar and record at least once a week. - Notify provider's office if blood sugars are > 200 on three consecutive times - Increase water intake

## 2020-09-08 LAB — URINE CULTURE
MICRO NUMBER:: 10937502
SPECIMEN QUALITY:: ADEQUATE

## 2020-09-10 ENCOUNTER — Other Ambulatory Visit: Payer: Self-pay

## 2020-09-10 ENCOUNTER — Other Ambulatory Visit: Payer: Medicare PPO

## 2020-09-10 DIAGNOSIS — I10 Essential (primary) hypertension: Secondary | ICD-10-CM | POA: Diagnosis not present

## 2020-09-10 DIAGNOSIS — E039 Hypothyroidism, unspecified: Secondary | ICD-10-CM | POA: Diagnosis not present

## 2020-09-10 DIAGNOSIS — E119 Type 2 diabetes mellitus without complications: Secondary | ICD-10-CM

## 2020-09-10 DIAGNOSIS — N390 Urinary tract infection, site not specified: Secondary | ICD-10-CM

## 2020-09-10 MED ORDER — SACCHAROMYCES BOULARDII 250 MG PO CAPS: 250.0000 mg | ORAL_CAPSULE | Freq: Two times a day (BID) | ORAL | 0 refills | Status: DC

## 2020-09-10 MED ORDER — AMOXICILLIN-POT CLAVULANATE 500-125 MG PO TABS: 1.0000 | ORAL_TABLET | Freq: Two times a day (BID) | ORAL | 0 refills | Status: DC

## 2020-09-11 LAB — CBC WITH DIFFERENTIAL/PLATELET
Absolute Monocytes: 383 cells/uL (ref 200–950)
Basophils Absolute: 50 cells/uL (ref 0–200)
Basophils Relative: 1.1 %
Eosinophils Absolute: 212 cells/uL (ref 15–500)
Eosinophils Relative: 4.7 %
HCT: 44.2 % (ref 35.0–45.0)
Hemoglobin: 13.8 g/dL (ref 11.7–15.5)
Lymphs Abs: 1184 cells/uL (ref 850–3900)
MCH: 26.5 pg — ABNORMAL LOW (ref 27.0–33.0)
MCHC: 31.2 g/dL — ABNORMAL LOW (ref 32.0–36.0)
MCV: 85 fL (ref 80.0–100.0)
MPV: 10.2 fL (ref 7.5–12.5)
Monocytes Relative: 8.5 %
Neutro Abs: 2673 cells/uL (ref 1500–7800)
Neutrophils Relative %: 59.4 %
Platelets: 281 10*3/uL (ref 140–400)
RBC: 5.2 10*6/uL — ABNORMAL HIGH (ref 3.80–5.10)
RDW: 15 % (ref 11.0–15.0)
Total Lymphocyte: 26.3 %
WBC: 4.5 10*3/uL (ref 3.8–10.8)

## 2020-09-11 LAB — COMPLETE METABOLIC PANEL WITH GFR
AG Ratio: 1.4 (calc) (ref 1.0–2.5)
ALT: 10 U/L (ref 6–29)
AST: 18 U/L (ref 10–35)
Albumin: 4.3 g/dL (ref 3.6–5.1)
Alkaline phosphatase (APISO): 57 U/L (ref 37–153)
BUN/Creatinine Ratio: 15 (calc) (ref 6–22)
BUN: 21 mg/dL (ref 7–25)
CO2: 26 mmol/L (ref 20–32)
Calcium: 9.7 mg/dL (ref 8.6–10.4)
Chloride: 107 mmol/L (ref 98–110)
Creat: 1.37 mg/dL — ABNORMAL HIGH (ref 0.60–0.88)
GFR, Est African American: 42 mL/min/{1.73_m2} — ABNORMAL LOW (ref >=60)
GFR, Est Non African American: 36 mL/min/{1.73_m2} — ABNORMAL LOW (ref >=60)
Globulin: 3 g/dL (calc) (ref 1.9–3.7)
Glucose, Bld: 132 mg/dL — ABNORMAL HIGH (ref 65–99)
Potassium: 4.4 mmol/L (ref 3.5–5.3)
Sodium: 141 mmol/L (ref 135–146)
Total Bilirubin: 0.6 mg/dL (ref 0.2–1.2)
Total Protein: 7.3 g/dL (ref 6.1–8.1)

## 2020-09-11 LAB — TSH: TSH: 10.85 mIU/L — ABNORMAL HIGH (ref 0.40–4.50)

## 2020-09-11 LAB — HEMOGLOBIN A1C
Hgb A1c MFr Bld: 6.6 % of total Hgb — ABNORMAL HIGH (ref <5.7)
Mean Plasma Glucose: 143 (calc)
eAG (mmol/L): 7.9 (calc)

## 2020-09-11 LAB — LIPID PANEL
Cholesterol: 224 mg/dL — ABNORMAL HIGH (ref <200)
HDL: 86 mg/dL (ref >=50)
LDL Cholesterol (Calc): 122 mg/dL (calc) — ABNORMAL HIGH
Non-HDL Cholesterol (Calc): 138 mg/dL (calc) — ABNORMAL HIGH (ref <130)
Total CHOL/HDL Ratio: 2.6 (calc) (ref <5.0)
Triglycerides: 67 mg/dL (ref <150)

## 2020-09-12 ENCOUNTER — Other Ambulatory Visit: Payer: Self-pay | Admitting: Family

## 2020-09-12 ENCOUNTER — Other Ambulatory Visit: Payer: Self-pay | Admitting: Family Medicine

## 2020-09-12 DIAGNOSIS — I10 Essential (primary) hypertension: Secondary | ICD-10-CM

## 2020-09-13 ENCOUNTER — Ambulatory Visit: Payer: Medicare PPO | Admitting: Family

## 2020-09-17 ENCOUNTER — Encounter: Payer: Self-pay | Admitting: Family

## 2020-09-17 ENCOUNTER — Ambulatory Visit (INDEPENDENT_AMBULATORY_CARE_PROVIDER_SITE_OTHER): Payer: Medicare PPO | Admitting: Family

## 2020-09-17 ENCOUNTER — Other Ambulatory Visit: Payer: Self-pay

## 2020-09-17 VITALS — BP 103/68 | HR 67 | Temp 98.0°F | Resp 16 | Ht 63.0 in | Wt 121.0 lb

## 2020-09-17 DIAGNOSIS — Z23 Encounter for immunization: Secondary | ICD-10-CM

## 2020-09-17 DIAGNOSIS — E039 Hypothyroidism, unspecified: Secondary | ICD-10-CM | POA: Diagnosis not present

## 2020-09-17 DIAGNOSIS — E1169 Type 2 diabetes mellitus with other specified complication: Secondary | ICD-10-CM | POA: Insufficient documentation

## 2020-09-17 DIAGNOSIS — E119 Type 2 diabetes mellitus without complications: Secondary | ICD-10-CM | POA: Diagnosis not present

## 2020-09-17 DIAGNOSIS — N1832 Chronic kidney disease, stage 3b: Secondary | ICD-10-CM | POA: Insufficient documentation

## 2020-09-17 DIAGNOSIS — E785 Hyperlipidemia, unspecified: Secondary | ICD-10-CM

## 2020-09-17 DIAGNOSIS — L602 Onychogryphosis: Secondary | ICD-10-CM | POA: Diagnosis not present

## 2020-09-17 DIAGNOSIS — I10 Essential (primary) hypertension: Secondary | ICD-10-CM | POA: Diagnosis not present

## 2020-09-17 MED ORDER — LANCETS ULTRA THIN 30G MISC: 100.0000 | 0 refills | Status: DC | PRN

## 2020-09-17 NOTE — Progress Notes (Addendum)
Provider: Marlowe Sax FNP-C   Neri Vieyra, Nelda Bucks, NP  Patient Care Team: Jerney Baksh, Nelda Bucks, NP as PCP - General (Family Medicine)  Extended Emergency Contact Information Primary Emergency Contact: Pellegrin,[name] Address: [address]          [place], [place] [address] [place] Phone: [phone] Relation: Daughter Secondary Emergency Contact: Berdan,Mitchell Address: [place]          [place] [address] [place] Mobile Phone: [phone] Relation: Son  Code Status: Full Code  Goals of care: Advanced Directive information Advanced Directives 09/17/2020  Does Patient Have a Medical Advance Directive? Yes  Type of Advance Directive Living will;Healthcare Power of Attorney  Does patient want to make changes to medical advance directive? No - Patient declined  Copy of [place] in Chart? Yes - validated most recent copy scanned in chart (See row information)  Would patient like information on creating a medical advance directive? -     Chief Complaint  Patient presents with  . Medical Management of Chronic Issues    6 month follow up  . Immunizations    Discuss the need for Tetanus Vaccine, and Influenza Vaccine.    HPI:  Pt is a 84 y.o. female seen today for 6 months follow up for medical management of chronic diseases.she denies any acute issues this visit.she is here to day with her care giver who comes to stay with her couple for days per week.she will also be assisting patient with checking her blood sugars need training today how to use glucometer. Patient does not remember to check her blood sugars due to her cognitive impairment.she lives by herself.No CBG for review today.Her recent lab work reviewed and discussed with patient.Hgb A1C 6.6 advised to avoid food s that are sugary/sweeties/sodas.Care giver states patient likes to drink Spirite soda everyday but make sure she drinks more water and stop the  sodas.  Hyperlipidemia - total cholesterol,TRG 67,LDL  122 states used to go to the [facility] three times per week to walk but has not.she recently started going for a walk with her care giver to the park.Plans to take her to the [facility] when the weather gets cold.she eats fried foods but has started eating chicken salad and tuna.  Hypothyroidism - Recent TSH level was 10.85 has not been taking her levothyroxine on empty stomach and misses doses.Advised to take levothyroxine first thin in the morning before breakfast.   CKD stage 3 b - recent serum CR 1.37 advised to avoid NSIADs medication such as aleve,Naproxen,and ibuprofen.states not taking current medication.  Health maintenance - she was advised to get her Tdap vaccine at her pharmacy but has not.Care giver states will take her to pharmacy in two weeks.she would like to get her influeza vaccine today. She denies any fever,chills,achiness,or cold symptoms.      Past Medical History:  Diagnosis Date  . Chicken pox   . Cystocele   . Diabetes mellitus without complication ([place])   . Glaucoma   . Hypertension   . Hypertensive retinopathy    OU  . Neuromuscular disorder ([place])    Dementia   . Thyroid disease    hypothyroidism   Past Surgical History:  Procedure Laterality Date  . ABDOMINAL HYSTERECTOMY  1990   TAH BSO  . CATARACT EXTRACTION Bilateral    Dr. [name]  . EYE SURGERY Bilateral    Cat Sx OU  . OOPHORECTOMY     BSO  .  Vaginal Bx     Papilloma    Allergies  Allergen Reactions  . Donepezil Hcl     Allergies as of 09/17/2020      Reactions   Donepezil Hcl       Medication List       Accurate as of September 17, 2020 11:22 AM. If you have any questions, ask your nurse or doctor.        amLODipine 10 MG tablet Commonly known as: NORVASC TAKE 1 TABLET(10 MG) BY MOUTH DAILY   amoxicillin-clavulanate 500-125 MG tablet Commonly known as: AUGMENTIN Take 1 tablet (500 mg total) by mouth in the morning and at  bedtime.   levothyroxine 50 MCG tablet Commonly known as: SYNTHROID TAKE 1 TABLET(50 MCG) BY MOUTH EVERY MORNING   lisinopril 2.5 MG tablet Commonly known as: Zestril Take 1 tablet (2.5 mg total) by mouth daily.   metoprolol succinate 50 MG 24 hr tablet Commonly known as: TOPROL-XL TAKE 1 TABLET(50 MG) BY MOUTH DAILY   saccharomyces boulardii 250 MG capsule Commonly known as: Florastor Take 1 capsule (250 mg total) by mouth 2 (two) times daily.       Review of Systems  Constitutional: Negative for appetite change, chills, fatigue and fever.  HENT: Positive for hearing loss. Negative for congestion, postnasal drip, rhinorrhea, sinus pressure, sinus pain, sneezing, sore throat and trouble swallowing.   Respiratory: Negative for cough, chest tightness, shortness of breath and wheezing.   Cardiovascular: Negative for chest pain, palpitations and leg swelling.  Gastrointestinal: Negative for abdominal distention, abdominal pain, constipation, diarrhea, nausea and vomiting.  Endocrine: Negative for cold intolerance, heat intolerance, polydipsia, polyphagia and polyuria.  Genitourinary: Negative for difficulty urinating, dysuria, flank pain, frequency and urgency.  Musculoskeletal: Negative for arthralgias, gait problem and joint swelling.  Skin: Negative for color change, pallor and rash.  Neurological: Negative for dizziness, speech difficulty, weakness, light-headedness, numbness and headaches.  Hematological: Does not bruise/bleed easily.  Psychiatric/Behavioral: Negative for agitation, decreased concentration and sleep disturbance. The patient is not nervous/anxious.     Immunization History  Administered Date(s) Administered  . Influenza, High Dose Seasonal PF 12/20/2017, 09/25/2019  . Influenza-Unspecified 12/20/2017  . PFIZER SARS-COV-2 Vaccination 01/05/2020, 02/05/2020  . Pneumococcal Polysaccharide-23 03/13/2020  . Zoster Recombinat (Shingrix) 02/07/2018, 05/18/2018    Pertinent  Health Maintenance Due  Topic Date Due  . INFLUENZA VACCINE  07/28/2020  . OPHTHALMOLOGY EXAM  12/18/2020  . FOOT EXAM  02/22/2021  . HEMOGLOBIN A1C  03/10/2021  . PNA vac Low Risk Adult (2 of 2 - PCV13) 03/13/2021  . DEXA SCAN  Completed   Fall Risk  09/17/2020 09/06/2020 06/21/2020 03/13/2020 11/16/2019  Falls in the past year? 0 0 0 0 0  Number falls in past yr: 0 0 0 0 0  Injury with Fall? 0 0 0 0 0    Vitals:   09/17/20 1051  BP: 103/68  Pulse: 67  Resp: 16  Temp: 98 F (36.7 C)  SpO2: 97%  Weight: 121 lb (54.9 kg)  Height: 5' 3"  (1.6 m)   Body mass index is 21.43 kg/m. Physical Exam Vitals reviewed.  Constitutional:      General: She is not in acute distress.    Appearance: She is not ill-appearing.  HENT:     Head: Normocephalic.     Right Ear: Tympanic membrane, ear canal and external ear normal. There is no impacted cerumen.     Left Ear: Tympanic membrane, ear canal and external ear normal.  There is no impacted cerumen.     Nose: Nose normal. No congestion or rhinorrhea.     Mouth/Throat:     Mouth: Mucous membranes are moist.     Pharynx: Oropharynx is clear. No oropharyngeal exudate or posterior oropharyngeal erythema.  Eyes:     General: No scleral icterus.       Right eye: No discharge.        Left eye: No discharge.     Extraocular Movements: Extraocular movements intact.     Conjunctiva/sclera: Conjunctivae normal.     Pupils: Pupils are equal, round, and reactive to light.  Neck:     Vascular: No carotid bruit.  Cardiovascular:     Rate and Rhythm: Normal rate and regular rhythm.     Pulses: Normal pulses.     Heart sounds: Normal heart sounds. No murmur heard.  No friction rub. No gallop.   Pulmonary:     Effort: Pulmonary effort is normal. No respiratory distress.     Breath sounds: Normal breath sounds. No wheezing, rhonchi or rales.  Chest:     Chest wall: No tenderness.  Abdominal:     General: Bowel sounds are normal.  There is no distension.     Palpations: Abdomen is soft. There is no mass.     Tenderness: There is no abdominal tenderness. There is no right CVA tenderness, left CVA tenderness, guarding or rebound.  Musculoskeletal:        General: No swelling or tenderness. Normal range of motion.     Cervical back: Normal range of motion. No rigidity or tenderness.     Comments: Bilateral lower extremities trace edema  Lymphadenopathy:     Cervical: No cervical adenopathy.  Skin:    General: Skin is warm and dry.     Coloration: Skin is not pale.     Findings: No bruising, erythema or rash.  Neurological:     Mental Status: She is alert.     Cranial Nerves: No cranial nerve deficit.     Motor: No weakness.     Coordination: Coordination normal.     Gait: Gait normal.     Comments: Forgetful keeps asking the same question over and over during visit   Psychiatric:        Mood and Affect: Mood normal.        Speech: Speech normal.        Behavior: Behavior normal.        Thought Content: Thought content normal.        Cognition and Memory: Memory is impaired. She exhibits impaired recent memory.        Judgment: Judgment normal.     Labs reviewed: Recent Labs    03/13/20 1405 06/21/20 1200 09/10/20 0802  NA 141 141 141  K 4.4 4.8 4.4  CL 105 104 107  CO2 24 26 26   GLUCOSE 99 114 132*  BUN 20 15 21   CREATININE 1.18* 1.18* 1.37*  CALCIUM 10.1 9.9 9.7   Recent Labs    03/13/20 1405 09/10/20 0802  AST 19 18  ALT 12 10  BILITOT 0.4 0.6  PROT 7.8 7.3   Recent Labs    03/13/20 1405 09/06/20 1630 09/10/20 0802  WBC 5.7 5.4 4.5  NEUTROABS 3,340 3,035 2,673  HGB 13.8 13.4 13.8  HCT 42.9 42.4 44.2  MCV 83.1 85.0 85.0  PLT 148 274 281   Lab Results  Component Value Date   TSH 10.85 (H) 09/10/2020  Lab Results  Component Value Date   HGBA1C 6.6 (H) 09/10/2020   Lab Results  Component Value Date   CHOL 224 (H) 09/10/2020   HDL 86 09/10/2020   LDLCALC 122 (H)  09/10/2020   TRIG 67 09/10/2020   CHOLHDL 2.6 09/10/2020    Significant Diagnostic Results in last 30 days:  Intravitreal Injection, Pharmacologic Agent - OD - Right Eye  Result Date: 08/28/2020 Time Out 08/28/2020. 3:58 PM. Confirmed correct patient, procedure, site, and patient consented. Anesthesia Topical anesthesia was used. Anesthetic medications included Lidocaine 2%, Proparacaine 0.5%. Procedure Preparation included 5% betadine to ocular surface, eyelid speculum. A (32g) needle was used. Injection: 1.25 mg Bevacizumab (AVASTIN) SOLN   NDC: 70360-001-02, Lot: [id number], Expiration date: 09/09/2020   Route: Intravitreal, Site: Right Eye, Waste: 0.05 mL Post-op Post injection exam found visual acuity of at least counting fingers. The patient tolerated the procedure well. There were no complications. The patient received written and verbal post procedure care education. Post injection medications were not given.   OCT, Retina - OU - Both Eyes  Result Date: 08/28/2020 Right Eye Quality was good. Central Foveal Thickness: 261. Progression has been stable. Findings include no SRF, epiretinal membrane, normal foveal contour, no IRF (Stable resolution of IRF/cystic changes temporal macula). Left Eye Quality was good. Central Foveal Thickness: 261. Progression has been stable. Findings include abnormal foveal contour, epiretinal membrane, no IRF, no SRF, vitreomacular adhesion  (Blunted foveal depression--stable). Notes *Images captured and stored on drive Diagnosis / Impression: ERM OU OD: stable resolution of IRF/cystic changes temporal macula OS: very mild ERM with blunted foveal depression -- stable; mild VMA Clinical management: See below Abbreviations: NFP - Normal foveal profile. CME - cystoid macular edema. PED - pigment epithelial detachment. IRF - intraretinal fluid. SRF - subretinal fluid. EZ - ellipsoid zone. ERM - epiretinal membrane. ORA - outer retinal atrophy. ORT - outer retinal tubulation. SRHM  - subretinal hyper-reflective material   Assessment/Plan 1. Essential hypertension B/p at goal. - continue on amlodipine 10 mg tablet daily ,metoprolol succinate 50 mg 24 Hr tablet daily and Lisinopril 2.5 mg tablet daily.  - CBC with Differential/Platelet; Future - CMP with eGFR([facility]); Future  2. Controlled type 2 diabetes mellitus without complication, without long-term current use of insulin (HCC) Lab Results  Component Value Date   HGBA1C 6.6 (H) 09/10/2020   No CBG for review. Use of Glucometer education provided to patient's Care giver by CMA today.  - Not on medication. - Dietary modification and exercise advised.will cut down on drinking sprite sodas and go back to the [facility] for walking exercise.  Not on ASA and Statin due to her advance age. - will refer to Podiatrist for her annual foot exam and trim toe nails.  - Ambulatory referral to Podiatry - Hemoglobin A1c; Future  3. Acquired hypothyroidism Lab Results  Component Value Date   TSH 10.85 (H) 09/10/2020   Not taking levothyroxine on empty stomach and misses doses.  - advised to take medication as directed on empty stomach and avoid missing medication.will recheck TSH level in 8 weeks then adjust dosage if needed.   - TSH; Future in 8 weeks   4. Overgrown toenails Bilateral over grown toenails. - Ambulatory referral to Podiatry  5. Hyperlipidemia associated with type 2 diabetes mellitus (HCC) LDL not at goal. - dietary modification and exercise advised.will cut down on fried foods.  - Lipid panel; Future  6. Need for influenza vaccination Afebrile.Flu vaccine administered by CMA today without  any reaction.  - Flu Vaccine QUAD High Dose(Fluad)  Family/ staff Communication: Reviewed plan of care with patient and care giver   Labs/tests ordered:  - CBC with Differential/Platelet; Future - CMP with eGFR([facility]); Future - Hemoglobin A1c; Future - Lipid panel; Future - TSH; Future in 8 weeks.   Next  Appointment : 6 months for medical management of chronic issues.Fasting labs 2-4 days prior to visit.   [name], NP

## 2020-09-17 NOTE — Patient Instructions (Signed)
-   take Levothyroxine 50 mcg tablet daily on an empty stomach before breakfast. - Please get Thyroid level checked in 2 months.  - Get your Tetanus vaccine at your pharmacy in 1 week

## 2020-09-18 ENCOUNTER — Telehealth: Payer: Self-pay

## 2020-09-18 MED ORDER — LANCETS ULTRA THIN 30G MISC: 100.0000 | 0 refills | Status: DC

## 2020-09-18 NOTE — Telephone Encounter (Signed)
Fax received from pharmacy about Softclix lancets. They need specific directions to process the prescription. Current prescription says test as directed. Please advise.

## 2020-09-18 NOTE — Telephone Encounter (Signed)
Three times weekly

## 2020-09-18 NOTE — Telephone Encounter (Signed)
Sent in script for three times weekly

## 2020-09-20 ENCOUNTER — Telehealth: Payer: Self-pay

## 2020-09-20 NOTE — Telephone Encounter (Signed)
Patient called stating she is confused and does not recall her next lab appointment date.  Patient stated that she had a paper and pen and wrote information down as I told her.

## 2020-09-20 NOTE — Telephone Encounter (Signed)
I will route this message to patients Ngetich, Dinah C, NP as a [place]

## 2020-09-20 NOTE — Telephone Encounter (Signed)
Patient called again and stated "I think I called yesterday to get the date of my lab appointment, yet I have misplaced the paper I wrote it down on, do you mind giving me that date and time?"   I informed patient she actually called less than an hour ago, patient did not recall calling today.  I informed patient of lab appointment date and time again. Patient assured me that she was writing it down and would not misplace it this time.

## 2020-09-23 NOTE — Telephone Encounter (Signed)
Thanks for the update.Patient has dementia. patient's daughter keeps up with her appointments.Recently got a care giver who brings her for appointment too.

## 2020-10-01 NOTE — Progress Notes (Signed)
[facility] Note  10/02/2020     CHIEF COMPLAINT Patient presents for Retina Follow Up   HISTORY OF PRESENT ILLNESS: Alejandra Davis is a 84 y.o. female who presents to the clinic today for:   HPI    Retina Follow Up    Patient presents with  CRVO/BRVO.  In right eye.  This started months ago.  Severity is moderate.  Duration of 5 weeks.  Since onset it is stable.  I, the attending physician,  performed the HPI with the patient and updated documentation appropriately.          Comments    84 y/o female pt here for 5 wk f/u for BRVO OD.  No change in [place] OU noticed.  Denies pain, FOL, floaters.  "Blue top gtt" BID OD.  BS and A1C unknown.       Last edited by [name], MD on 10/03/2020  2:53 PM. (History)       Referring physician: [name], NP [place],  [place] [id number]  HISTORICAL INFORMATION:   Selected notes from the MEDICAL RECORD NUMBER Referred by [name] for DM exam    CURRENT MEDICATIONS: No current outpatient medications on file. (Ophthalmic Drugs)   No current facility-administered medications for this visit. (Ophthalmic Drugs)   Current Outpatient Medications (Other)  Medication Sig  . amLODipine (NORVASC) 10 MG tablet TAKE 1 TABLET(10 MG) BY MOUTH DAILY  . amoxicillin-clavulanate (AUGMENTIN) 500-125 MG tablet Take 1 tablet (500 mg total) by mouth in the morning and at bedtime.  . Lancets Ultra Thin 30G MISC 100 each by Does not apply route 3 (three) times a week.  . levothyroxine (SYNTHROID) 50 MCG tablet TAKE 1 TABLET(50 MCG) BY MOUTH EVERY MORNING  . lisinopril (ZESTRIL) 2.5 MG tablet Take 1 tablet (2.5 mg total) by mouth daily.  . metoprolol succinate (TOPROL-XL) 50 MG 24 hr tablet TAKE 1 TABLET(50 MG) BY MOUTH DAILY  . saccharomyces boulardii (FLORASTOR) 250 MG capsule Take 1 capsule (250 mg total) by mouth 2 (two) times daily.   No current facility-administered medications for this visit.  (Other)      REVIEW OF SYSTEMS: ROS    Positive for: Genitourinary, Musculoskeletal, Eyes   Negative for: Constitutional, Gastrointestinal, Neurological, Skin, HENT, Endocrine, Cardiovascular, Respiratory, Psychiatric, Allergic/Imm, Heme/Lymph   Last edited by Matthew Folks, COA on 10/02/2020  1:19 PM. (History)       ALLERGIES Allergies  Allergen Reactions  . Donepezil Hcl     PAST MEDICAL HISTORY Past Medical History:  Diagnosis Date  . Chicken pox   . Cystocele   . Diabetes mellitus without complication ([place])   . Glaucoma   . Hypertension   . Hypertensive retinopathy    OU  . Neuromuscular disorder ([place])    Dementia   . Thyroid disease    hypothyroidism   Past Surgical History:  Procedure Laterality Date  . ABDOMINAL HYSTERECTOMY  1990   TAH BSO  . CATARACT EXTRACTION Bilateral    Dr. [name]  . EYE SURGERY Bilateral    Cat Sx OU  . OOPHORECTOMY     BSO  . Vaginal Bx     Papilloma    FAMILY HISTORY Family History  Problem Relation Age of Onset  . Sickle cell anemia Other   . Hypertension Mother   . Cancer Father        LIVER  . Heart disease Brother   .  Cancer Brother        STOMACH    SOCIAL HISTORY Social History   Tobacco Use  . Smoking status: Never Smoker  . Smokeless tobacco: Never Used  Vaping Use  . Vaping Use: Never used  Substance Use Topics  . Alcohol use: No  . Drug use: No         OPHTHALMIC EXAM:  Base Eye Exam    Visual Acuity (Snellen - Linear)      Right Left   Dist [place] 20/30 -2 20/60 -2   Dist ph [place] NI 20/25 -       Tonometry (Tonopen, 1:25 PM)      Right Left   Pressure 13 13       Pupils      Dark Light Shape React APD   Right 3 2 Round Brisk None   Left 3 2 Round Brisk None       Visual Fields (Counting fingers)      Left Right    Full Full       Extraocular Movement      Right Left    Full, Ortho Full, Ortho       Neuro/Psych    Oriented x3: Yes   Mood/Affect: Normal       Dilation     Both eyes: 1.0% Mydriacyl, 2.5% Phenylephrine @ 1:25 PM        Slit Lamp and Fundus Exam    Slit Lamp Exam      Right Left   Lids/Lashes Dermatochalasis - upper lid, Meibomian gland dysfunction Dermatochalasis - upper lid, Meibomian gland dysfunction   Conjunctiva/Sclera Melanosis, no injection Melanosis   Cornea Arcus, 1+ Punctate epithelial erosions, mild Anterior basement membrane dystrophy, Well healed cataract wounds, tr Descemet's folds tempoally Arcus, Inferior 1+ Punctate epithelial erosions; 2-3+ microcystic edema temporal with 2+ descemet folds, Keratic precipitates.  Well healed temporal cat sx incision.   Anterior Chamber Deep, no cell or flare Deep;  no cell or flare   Iris Round and dilated, No NVI Round and dilated, No NVI   Lens PC IOL in good position, trace Posterior capsular opacification Posterior chamber intraocular lens in good position.  1+ PCO.   Vitreous Vitreous syneresis Vitreous syneresis       Fundus Exam      Right Left   Disc Sharp rim, inferior notch, 2-3+ pallor, inf. Rim thinning, attenuated vessels superiorly 1-2+ pallor, Sharp rim, pink   C/D Ratio 0.7 0.6   Macula Flat, good foveal reflex, mild IRH superior macula--improving, stable improvement in Cystic changes Flat, good foveal reflex, mild RPE mottling and clumping, Epiretinal membrane, No heme or edema   Vessels Severe vascular attenuation greatest ST arcades, superior>inferior, BRVO Mild vascular attenuation, AV crossing changes   Periphery Attached, scattered IRH/DBH superior hemisphere - improving Attached, mild, scattered Reticular degeneration, No heme           IMAGING AND PROCEDURES  Imaging and Procedures for 03/07/18  OCT, Retina - OU - Both Eyes       Right Eye Quality was good. Central Foveal Thickness: 262. Progression has been stable. Findings include no SRF, epiretinal membrane, normal foveal contour, no IRF (Stable resolution of IRF/cystic changes temporal macula).    Left Eye Quality was good. Central Foveal Thickness: 264. Progression has been stable. Findings include abnormal foveal contour, epiretinal membrane, no IRF, no SRF, vitreomacular adhesion  (Blunted foveal depression--stable).   Notes *Images captured and stored on drive  Diagnosis / Impression:  ERM OU OD: stable resolution of IRF/cystic changes temporal macula OS: very mild ERM with blunted foveal depression -- stable; mild VMA  Clinical management:  See below  Abbreviations: NFP - Normal foveal profile. CME - cystoid macular edema. PED - pigment epithelial detachment. IRF - intraretinal fluid. SRF - subretinal fluid. EZ - ellipsoid zone. ERM - epiretinal membrane. ORA - outer retinal atrophy. ORT - outer retinal tubulation. SRHM - subretinal hyper-reflective material         Intravitreal Injection, Pharmacologic Agent - OD - Right Eye       Time Out 10/02/2020. 2:10 PM. Confirmed correct patient, procedure, site, and patient consented.   Anesthesia Topical anesthesia was used. Anesthetic medications included Lidocaine 2%, Proparacaine 0.5%.   Procedure Preparation included 5% betadine to ocular surface, eyelid speculum. A (32g) needle was used.   Injection:  1.25 mg Bevacizumab (AVASTIN) SOLN   NDC: 40086-761-95, Lot: [id number], Expiration date: 11/03/2020   Route: Intravitreal, Site: Right Eye, Waste: 0.05 mL  Post-op Post injection exam found visual acuity of at least counting fingers. The patient tolerated the procedure well. There were no complications. The patient received written and verbal post procedure care education. Post injection medications were not given.                 ASSESSMENT/PLAN:    ICD-10-CM   1. Branch retinal vein occlusion of right eye with macular edema  H34.8310 Intravitreal Injection, Pharmacologic Agent - OD - Right Eye    Bevacizumab (AVASTIN) SOLN 1.25 mg  2. Retinal edema  H35.81 OCT, Retina - OU - Both Eyes  3. Diabetes  mellitus type 2 without retinopathy ([place])  E11.9   4. Epiretinal membrane (ERM) of both eyes  H35.373   5. Pseudophakia of both eyes  Z96.1   6. Ocular hypertension of right eye  H40.051   7. Glaucoma suspect of both eyes  H40.003   8. Essential hypertension  I10   9. Hypertensive retinopathy of both eyes  H35.033     1. BRVO with CME OD  - at presentation, BCVA 20/60 OD -- down from 20/30  - initial OCT w/ CME superior macula  - s/p IVA OD #1 (10.18.19), #2 (11.19.19), #3 (12.17.19), #4 (02.19.20), #5 (03.23.20), #6 (04.21.20), #7 (05.27.20), #8 (07.07.20), #9 (08.25.20), #10 (9.29.20), #11 (11.17.20), #12 (12.22.20), #13 (01.27.21), #14 (03.10.21), #15 (04.21.21), #16 (05.26.21), #17 (06.30.21), #18 (08.04.21), #19 (09.01.21)  - good response to medications, but held IVA in January 2020 due to cataract surgery  - today, BCVA 20/30  - OCT shows stable improvement in cystic changes temporal macula at 5 wk interval   - recommend IVA OD #20 today, 10.06.21 -- maintenance w/ extension to 6 wks  - pt wishes to proceed  - RBA of procedure discussed, questions answered  - informed consent obtained   - Avastin informed consent form signed and scanned on 01.27.2021  - see procedure note  - history of interval increase in IRF/cystic changes at 6+ weeks interval, but doing very well last visits, so will trial extension to 6 wks  - f/u 6 weeks -- DFE/OCT, possible injection  2. Diabetes mellitus, type 2 without retinopathy  - The incidence, risk factors for progression, natural history and treatment options for diabetic retinopathy  were discussed with patient.     - The need for close monitoring of blood glucose, blood pressure, and serum lipids, avoiding cigarette or any type of tobacco, and the need  for long term follow up was also discussed with patient.   3,4. Epiretinal membrane, OU  - relatively mild ERM OU -- blunted central foveal depression  - OD with mild cystic changes 2/2 BRVO as  above; OS without cystic changes or edema  - discussed findings and prognosis   - monitor  5. Pseudophakia OU  - s/p CE/IOL OS 11.13.19 w/ Dr. [name]  - s/p CE/IOL OD 01.16.20 w/ Dr. [name]  - beautiful surgeries -- IOLs in perfect position  - healing well post-operatively  - IRF/CME OD may have been partly due to post op CME ([place]) in addition to BRVO  6,7. Ocular hypertension / Glaucoma suspect OD>OS  - IOP ~25 OD, 20 OS at prior clinic visits  - today IOP 13 OU  - denies any family hx of glaucoma  - under the expert care of Dr. [name]  - uses Cosopt BID OU  8,9. Hypertensive retinopathy OU w/ symptomatic headache  - pt presented to 6.24.2020 visit urgently for right sided headache above right eye with extension to occiput  - BP at that time was 201/100 -- pt was sent to primary care clinic for urgent evaluation and meds were adjusted  - BP now under better control and headaches improved  - discussed importance of tight BP control   Ophthalmic Meds Ordered this visit:  Meds ordered this encounter  Medications  . Bevacizumab (AVASTIN) SOLN 1.25 mg       Return in about 6 weeks (around 11/13/2020) for f/u BRVO OD, DFE, OCT.  There are no Patient Instructions on file for this visit.  This document serves as a record of services personally performed by Gardiner Sleeper, MD, PhD. It was created on their behalf by Roselee Nova, COMT. The creation of this record is the provider's dictation and/or activities during the visit.  Electronically signed by: Roselee Nova, COMT 10/03/20 2:56 PM  This document serves as a record of services personally performed by Gardiner Sleeper, MD, PhD. It was created on their behalf by San Jetty. Owens Shark, OA an ophthalmic technician. The creation of this record is the provider's dictation and/or activities during the visit.    Electronically signed by: San Jetty. Owens Shark, [place] 10.06.2021 2:56 PM   Gardiner Sleeper, M.D., Ph.D. Diseases & Surgery of  the Retina and Vitreous [facility]  I have reviewed the above documentation for accuracy and completeness, and I agree with the above. Gardiner Sleeper, M.D., Ph.D. 10/03/20 2:56 PM  Abbreviations: M myopia (nearsighted); A astigmatism; H hyperopia (farsighted); P presbyopia; Mrx spectacle prescription;  CTL contact lenses; OD right eye; OS left eye; OU both eyes  XT exotropia; ET esotropia; PEK punctate epithelial keratitis; PEE punctate epithelial erosions; DES dry eye syndrome; MGD meibomian gland dysfunction; ATs artificial tears; PFAT's preservative free artificial tears; [place] nuclear sclerotic cataract; PSC posterior subcapsular cataract; ERM epi-retinal membrane; PVD posterior vitreous detachment; RD retinal detachment; DM diabetes mellitus; DR diabetic retinopathy; NPDR non-proliferative diabetic retinopathy; PDR proliferative diabetic retinopathy; CSME clinically significant macular edema; DME diabetic macular edema; dbh dot blot hemorrhages; CWS cotton wool spot; POAG primary open angle glaucoma; C/D cup-to-disc ratio; HVF humphrey visual field; GVF goldmann visual field; OCT optical coherence tomography; IOP intraocular pressure; BRVO Branch retinal vein occlusion; CRVO central retinal vein occlusion; CRAO central retinal artery occlusion; BRAO branch retinal artery occlusion; RT retinal tear; SB scleral buckle; PPV pars plana vitrectomy; VH Vitreous hemorrhage; PRP panretinal laser photocoagulation; IVK intravitreal  kenalog; VMT vitreomacular traction; MH Macular hole;  NVD neovascularization of the disc; NVE neovascularization elsewhere; AREDS age related eye disease study; ARMD age related macular degeneration; POAG primary open angle glaucoma; EBMD epithelial/anterior basement membrane dystrophy; ACIOL anterior chamber intraocular lens; IOL intraocular lens; PCIOL posterior chamber intraocular lens; Phaco/IOL phacoemulsification with intraocular lens placement; [place]  photorefractive keratectomy; LASIK laser assisted in situ keratomileusis; HTN hypertension; DM diabetes mellitus; COPD chronic obstructive pulmonary disease

## 2020-10-02 ENCOUNTER — Other Ambulatory Visit: Payer: Self-pay

## 2020-10-02 ENCOUNTER — Encounter (INDEPENDENT_AMBULATORY_CARE_PROVIDER_SITE_OTHER): Payer: Self-pay | Admitting: Ophthalmology

## 2020-10-02 ENCOUNTER — Ambulatory Visit (INDEPENDENT_AMBULATORY_CARE_PROVIDER_SITE_OTHER): Payer: Medicare PPO | Admitting: Ophthalmology

## 2020-10-02 DIAGNOSIS — H40003 Preglaucoma, unspecified, bilateral: Secondary | ICD-10-CM

## 2020-10-02 DIAGNOSIS — H35033 Hypertensive retinopathy, bilateral: Secondary | ICD-10-CM

## 2020-10-02 DIAGNOSIS — Z961 Presence of intraocular lens: Secondary | ICD-10-CM

## 2020-10-02 DIAGNOSIS — H3581 Retinal edema: Secondary | ICD-10-CM | POA: Diagnosis not present

## 2020-10-02 DIAGNOSIS — I10 Essential (primary) hypertension: Secondary | ICD-10-CM

## 2020-10-02 DIAGNOSIS — H35373 Puckering of macula, bilateral: Secondary | ICD-10-CM | POA: Diagnosis not present

## 2020-10-02 DIAGNOSIS — E119 Type 2 diabetes mellitus without complications: Secondary | ICD-10-CM

## 2020-10-02 DIAGNOSIS — H40051 Ocular hypertension, right eye: Secondary | ICD-10-CM | POA: Diagnosis not present

## 2020-10-02 DIAGNOSIS — H34831 Tributary (branch) retinal vein occlusion, right eye, with macular edema: Secondary | ICD-10-CM

## 2020-10-03 ENCOUNTER — Encounter (INDEPENDENT_AMBULATORY_CARE_PROVIDER_SITE_OTHER): Payer: Self-pay | Admitting: Ophthalmology

## 2020-10-03 DIAGNOSIS — H40051 Ocular hypertension, right eye: Secondary | ICD-10-CM | POA: Diagnosis not present

## 2020-10-03 DIAGNOSIS — H3581 Retinal edema: Secondary | ICD-10-CM | POA: Diagnosis not present

## 2020-10-03 DIAGNOSIS — Z961 Presence of intraocular lens: Secondary | ICD-10-CM | POA: Diagnosis not present

## 2020-10-03 DIAGNOSIS — E119 Type 2 diabetes mellitus without complications: Secondary | ICD-10-CM | POA: Diagnosis not present

## 2020-10-03 DIAGNOSIS — H35373 Puckering of macula, bilateral: Secondary | ICD-10-CM | POA: Diagnosis not present

## 2020-10-03 DIAGNOSIS — H34831 Tributary (branch) retinal vein occlusion, right eye, with macular edema: Secondary | ICD-10-CM | POA: Diagnosis not present

## 2020-10-03 DIAGNOSIS — H40003 Preglaucoma, unspecified, bilateral: Secondary | ICD-10-CM | POA: Diagnosis not present

## 2020-10-03 DIAGNOSIS — H35033 Hypertensive retinopathy, bilateral: Secondary | ICD-10-CM | POA: Diagnosis not present

## 2020-10-03 DIAGNOSIS — I10 Essential (primary) hypertension: Secondary | ICD-10-CM | POA: Diagnosis not present

## 2020-10-03 MED ORDER — BEVACIZUMAB CHEMO INJECTION 1.25MG/0.05ML SYRINGE FOR KALEIDOSCOPE
1.2500 mg | INTRAVITREAL | Status: AC | PRN
  Administered 2020-10-03: 1.25 mg via INTRAVITREAL

## 2020-10-15 DIAGNOSIS — E113493 Type 2 diabetes mellitus with severe nonproliferative diabetic retinopathy without macular edema, bilateral: Secondary | ICD-10-CM | POA: Diagnosis not present

## 2020-10-15 DIAGNOSIS — H04123 Dry eye syndrome of bilateral lacrimal glands: Secondary | ICD-10-CM | POA: Diagnosis not present

## 2020-10-15 DIAGNOSIS — H26493 Other secondary cataract, bilateral: Secondary | ICD-10-CM | POA: Diagnosis not present

## 2020-10-15 DIAGNOSIS — H401133 Primary open-angle glaucoma, bilateral, severe stage: Secondary | ICD-10-CM | POA: Diagnosis not present

## 2020-10-16 ENCOUNTER — Other Ambulatory Visit: Payer: Self-pay

## 2020-10-16 ENCOUNTER — Ambulatory Visit: Payer: Medicare PPO | Admitting: Podiatry

## 2020-10-16 ENCOUNTER — Encounter: Payer: Self-pay | Admitting: Podiatry

## 2020-10-16 DIAGNOSIS — M79674 Pain in right toe(s): Secondary | ICD-10-CM | POA: Diagnosis not present

## 2020-10-16 DIAGNOSIS — E119 Type 2 diabetes mellitus without complications: Secondary | ICD-10-CM

## 2020-10-16 DIAGNOSIS — M21619 Bunion of unspecified foot: Secondary | ICD-10-CM | POA: Diagnosis not present

## 2020-10-16 DIAGNOSIS — B351 Tinea unguium: Secondary | ICD-10-CM

## 2020-10-16 DIAGNOSIS — M79675 Pain in left toe(s): Secondary | ICD-10-CM

## 2020-10-16 NOTE — Progress Notes (Signed)
  Subjective:  Patient ID: Alejandra Davis, female    DOB: [birth year],  MRN: [id number]  Chief Complaint  Patient presents with  . routine foot care    nail trim    84 y.o. female returns for the above complaint.  Patient presents with thickened elongated dystrophic toenails x10.  Patient would like to have them debrided down.  She denies any other acute complaints she is a type II diabetic with last A1c of 6.6.  She has not seen anyone else prior to seeing me.  Objective:  There were no vitals filed for this visit. Podiatric Exam: Vascular: dorsalis pedis and posterior tibial pulses are palpable bilateral. Capillary return is immediate. Temperature gradient is WNL. Skin turgor WNL  Sensorium: Normal Semmes Weinstein monofilament test. Normal tactile sensation bilaterally. Nail Exam: Pt has thick disfigured discolored nails with subungual debris noted bilateral entire nail hallux through fifth toenails.  Pain on palpation to the nails. Ulcer Exam: There is no evidence of ulcer or pre-ulcerative changes or infection. Orthopedic Exam: Muscle tone and strength are WNL. No limitations in general ROM. No crepitus or effusions noted. HAV  B/L.  Hammer toes 2-5  B/L. Skin: No Porokeratosis. No infection or ulcers    Assessment & Plan:   1. Type 2 diabetes mellitus without complication, without long-term current use of insulin (HCC)   2. Pain due to onychomycosis of toenails of both feet   3. Bunion     Patient was evaluated and treated and all questions answered.  Bilateral bunions -I explained to the patient the etiology of bunions and various treatment options were extensively discussed.  I believe patient will benefit from conservative care with offloading shoe gear modification.  Patient states understanding and states she would like to just focus on conservative treatment options for now.  Onychomycosis with pain  -Nails palliatively debrided as below. -Educated on  self-care  Procedure: Nail Debridement Rationale: pain  Type of Debridement: manual, sharp debridement. Instrumentation: Nail nipper, rotary burr. Number of Nails: 10  Procedures and Treatment: Consent by patient was obtained for treatment procedures. The patient understood the discussion of treatment and procedures well. All questions were answered thoroughly reviewed. Debridement of mycotic and hypertrophic toenails, 1 through 5 bilateral and clearing of subungual debris. No ulceration, no infection noted.  Return Visit-Office Procedure: Patient instructed to return to the office for a follow up visit 3 months for continued evaluation and treatment.  Boneta Lucks, DPM    No follow-ups on file.

## 2020-10-29 ENCOUNTER — Other Ambulatory Visit: Payer: Self-pay | Admitting: Family

## 2020-10-29 DIAGNOSIS — R413 Other amnesia: Secondary | ICD-10-CM

## 2020-10-30 ENCOUNTER — Encounter: Payer: Self-pay | Admitting: Counselor

## 2020-11-12 ENCOUNTER — Other Ambulatory Visit: Payer: Self-pay

## 2020-11-12 ENCOUNTER — Other Ambulatory Visit: Payer: Medicare PPO

## 2020-11-12 DIAGNOSIS — E039 Hypothyroidism, unspecified: Secondary | ICD-10-CM

## 2020-11-12 NOTE — Progress Notes (Addendum)
[facility] Note  11/13/2020     CHIEF COMPLAINT Patient presents for Retina Follow Up   HISTORY OF PRESENT ILLNESS: Alejandra Davis is a 84 y.o. female who presents to the clinic today for:   HPI    Retina Follow Up    Patient presents with  CRVO/BRVO.  In right eye.  This started 6 weeks ago.  I, the attending physician,  performed the HPI with the patient and updated documentation appropriately.          Comments    Patient here for 6 weeks retina follow up for BRVO OD. Patient states vision doing ok. No eye pain.        Last edited by [name], MD on 11/13/2020  4:39 PM. (History)    pt seen by Dr. [name] on 10.19.21 and started on Cosopt BID OU   Referring physician: [name], NP [place],  [place] [id number]  HISTORICAL INFORMATION:   Selected notes from the MEDICAL RECORD NUMBER Referred by [name] for DM exam    CURRENT MEDICATIONS: No current outpatient medications on file. (Ophthalmic Drugs)   No current facility-administered medications for this visit. (Ophthalmic Drugs)   Current Outpatient Medications (Other)  Medication Sig  . amLODipine (NORVASC) 10 MG tablet TAKE 1 TABLET(10 MG) BY MOUTH DAILY  . amoxicillin-clavulanate (AUGMENTIN) 500-125 MG tablet Take 1 tablet (500 mg total) by mouth in the morning and at bedtime.  . Lancets Ultra Thin 30G MISC 100 each by Does not apply route 3 (three) times a week.  . levothyroxine (SYNTHROID) 75 MCG tablet Take 1 tablet (75 mcg total) by mouth daily.  Marland Kitchen lisinopril (ZESTRIL) 2.5 MG tablet Take 1 tablet (2.5 mg total) by mouth daily.  . metoprolol succinate (TOPROL-XL) 50 MG 24 hr tablet TAKE 1 TABLET(50 MG) BY MOUTH DAILY  . saccharomyces boulardii (FLORASTOR) 250 MG capsule Take 1 capsule (250 mg total) by mouth 2 (two) times daily.   No current facility-administered medications for this visit. (Other)      REVIEW OF SYSTEMS: ROS    Positive for:  Genitourinary, Musculoskeletal, Eyes   Negative for: Constitutional, Gastrointestinal, Neurological, Skin, HENT, Endocrine, Cardiovascular, Respiratory, Psychiatric, Allergic/Imm, Heme/Lymph   Last edited by [name], COA on 11/13/2020  2:50 PM. (History)       ALLERGIES Allergies  Allergen Reactions  . Donepezil Hcl     PAST MEDICAL HISTORY Past Medical History:  Diagnosis Date  . Chicken pox   . Cystocele   . Diabetes mellitus without complication ([place])   . Glaucoma   . Hypertension   . Hypertensive retinopathy    OU  . Neuromuscular disorder ([place])    Dementia   . Thyroid disease    hypothyroidism   Past Surgical History:  Procedure Laterality Date  . ABDOMINAL HYSTERECTOMY  1990   TAH BSO  . CATARACT EXTRACTION Bilateral    Dr. [name]  . EYE SURGERY Bilateral    Cat Sx OU  . OOPHORECTOMY     BSO  . Vaginal Bx     Papilloma    FAMILY HISTORY Family History  Problem Relation Age of Onset  . Sickle cell anemia Other   . Hypertension Mother   . Cancer Father        LIVER  . Heart disease Brother   . Cancer Brother        STOMACH    SOCIAL HISTORY  Social History   Tobacco Use  . Smoking status: Never Smoker  . Smokeless tobacco: Never Used  Vaping Use  . Vaping Use: Never used  Substance Use Topics  . Alcohol use: No  . Drug use: No         OPHTHALMIC EXAM:  Base Eye Exam    Visual Acuity (Snellen - Linear)      Right Left   Dist [place] 20/40 +1 20/40   Dist ph [place] 20/30 -2 20/25 -1       Tonometry (Tonopen, 2:48 PM)      Right Left   Pressure 12 11       Pupils      Dark Light Shape React APD   Right 3 2 Round Brisk None   Left 3 2 Round Brisk None       Visual Fields (Counting fingers)      Left Right    Full Full       Extraocular Movement      Right Left    Full, Ortho Full, Ortho       Neuro/Psych    Oriented x3: Yes   Mood/Affect: Normal       Dilation    Both eyes: 1.0% Mydriacyl, 2.5% Phenylephrine @  2:48 PM        Slit Lamp and Fundus Exam    Slit Lamp Exam      Right Left   Lids/Lashes Dermatochalasis - upper lid, Meibomian gland dysfunction Dermatochalasis - upper lid, Meibomian gland dysfunction   Conjunctiva/Sclera Melanosis, no injection Melanosis   Cornea Arcus, 1+ Punctate epithelial erosions, mild Anterior basement membrane dystrophy, Well healed cataract wounds, tr Descemet's folds tempoally Arcus, Inferior 1+ Punctate epithelial erosions; 2-3+ microcystic edema temporal with 2+ descemet folds, Keratic precipitates.  Well healed temporal cat sx incision.   Anterior Chamber Deep, no cell or flare Deep;  no cell or flare   Iris Round and dilated, No NVI Round and dilated, No NVI   Lens PC IOL in good position, trace Posterior capsular opacification Posterior chamber intraocular lens in good position.  1+ PCO.   Vitreous Vitreous syneresis Vitreous syneresis       Fundus Exam      Right Left   Disc Sharp rim, inferior notch, 2-3+ pallor, inf. Rim thinning, attenuated vessels superiorly 2+ pallor, Sharp rim, pink   C/D Ratio 0.7 0.7   Macula Flat, good foveal reflex, mild IRH superior macula--improving, stable improvement in Cystic changes Flat, good foveal reflex, mild RPE mottling and clumping, Epiretinal membrane, No heme or edema   Vessels Severe vascular attenuation greatest ST arcades, superior>inferior, BRVO, mild tortuousity Mild vascular attenuation, AV crossing changes   Periphery Attached, scattered IRH/DBH superior hemisphere - improving Attached, mild, scattered Reticular degeneration, No heme         Refraction    Wearing Rx      Sphere Cylinder Axis Add   Right -0.75 +1.25 170 +2.50   Left -0.75 +1.00 003 +2.50   Type: PAL          IMAGING AND PROCEDURES  Imaging and Procedures for 03/07/18  OCT, Retina - OU - Both Eyes       Right Eye Quality was good. Central Foveal Thickness: 264. Progression has been stable. Findings include no SRF, epiretinal  membrane, normal foveal contour, no IRF (Stable resolution of IRF/cystic changes temporal macula).   Left Eye Quality was good. Central Foveal Thickness: 260. Progression has been stable.  Findings include abnormal foveal contour, epiretinal membrane, no IRF, no SRF, vitreomacular adhesion  (Blunted foveal depression--stable).   Notes *Images captured and stored on drive  Diagnosis / Impression:  ERM OU OD: stable resolution of IRF/cystic changes temporal macula OS: very mild ERM with blunted foveal depression -- stable; mild VMA  Clinical management:  See below  Abbreviations: NFP - Normal foveal profile. CME - cystoid macular edema. PED - pigment epithelial detachment. IRF - intraretinal fluid. SRF - subretinal fluid. EZ - ellipsoid zone. ERM - epiretinal membrane. ORA - outer retinal atrophy. ORT - outer retinal tubulation. SRHM - subretinal hyper-reflective material         Intravitreal Injection, Pharmacologic Agent - OD - Right Eye       Time Out 11/13/2020. 3:30 PM. Confirmed correct patient, procedure, site, and patient consented.   Anesthesia Topical anesthesia was used. Anesthetic medications included Lidocaine 2%, Proparacaine 0.5%.   Procedure Preparation included 5% betadine to ocular surface, eyelid speculum. A (32g) needle was used.   Injection:  1.25 mg Bevacizumab (AVASTIN) SOLN   NDC: 45625-638-93, Lot: [id number] , Expiration date: 12/24/2020   Route: Intravitreal, Site: Right Eye, Waste: 0 mL  Post-op Post injection exam found visual acuity of at least counting fingers. The patient tolerated the procedure well. There were no complications. The patient received written and verbal post procedure care education. Post injection medications were not given.                 ASSESSMENT/PLAN:    ICD-10-CM   1. Branch retinal vein occlusion of right eye with macular edema  H34.8310 Intravitreal Injection, Pharmacologic Agent - OD - Right Eye     Bevacizumab (AVASTIN) SOLN 1.25 mg  2. Retinal edema  H35.81 OCT, Retina - OU - Both Eyes  3. Diabetes mellitus type 2 without retinopathy ([place])  E11.9   4. Epiretinal membrane (ERM) of both eyes  H35.373   5. Pseudophakia of both eyes  Z96.1   6. Ocular hypertension of right eye  H40.051   7. Glaucoma suspect of both eyes  H40.003   8. Essential hypertension  I10   9. Hypertensive retinopathy of both eyes  H35.033   10. Headache above the eye region  R51.9     1,2. BRVO with CME OD  - at presentation, BCVA 20/60 OD -- down from 20/30  - initial OCT w/ CME superior macula  - s/p IVA OD #1 (10.18.19), #2 (11.19.19), #3 (12.17.19), #4 (02.19.20), #5 (03.23.20), #6 (04.21.20), #7 (05.27.20), #8 (07.07.20), #9 (08.25.20), #10 (9.29.20), #11 (11.17.20), #12 (12.22.20), #13 (01.27.21), #14 (03.10.21), #15 (04.21.21), #16 (05.26.21), #17 (06.30.21), #18 (08.04.21), #19 (09.01.21), #20 (10.06.21)  - good response to medications, but held IVA in January 2020 due to cataract surgery  - today, BCVA 20/30  - OCT shows stable improvement in cystic changes temporal macula at 6 wk interval   - recommend IVA OD #20 today, 11.17.21 -- maintenance w/ extension to 6-7 wks  - pt wishes to proceed  - RBA of procedure discussed, questions answered  - informed consent obtained   - Avastin informed consent form signed and scanned on 01.27.2021  - see procedure note  - history of interval increase in IRF/cystic changes at 6+ weeks interval, but stable improvement in IRF/cystic changes at 6 wks today  - f/u 6-7 weeks -- DFE/OCT, possible injection  3. Diabetes mellitus, type 2 without retinopathy  - The incidence, risk factors for progression, natural history and treatment options  for diabetic retinopathy  were discussed with patient.     - The need for close monitoring of blood glucose, blood pressure, and serum lipids, avoiding cigarette or any type of tobacco, and the need for long term follow up was also  discussed with patient.   4. Epiretinal membrane, OU  - relatively mild ERM OU -- blunted central foveal depression  - OD with mild cystic changes 2/2 BRVO as above; OS without cystic changes or edema  - discussed findings and prognosis   - monitor  5. Pseudophakia OU  - s/p CE/IOL OS 11.13.19 w/ Dr. [name]  - s/p CE/IOL OD 01.16.20 w/ Dr. [name]  - beautiful surgeries -- IOLs in perfect position  - healing well post-operatively  - IRF/CME OD may have been partly due to post op CME ([place]) in addition to BRVO  6,7. Ocular hypertension / Glaucoma suspect OD>OS  - IOP ~25 OD, 20 OS at prior clinic visits  - today IOP 12,11  - denies any family hx of glaucoma  - under the expert care of Dr. [name]  - uses Cosopt BID OU  8-10. Hypertensive retinopathy OU w/ symptomatic headache  - pt presented to 6.24.2020 visit urgently for right sided headache above right eye with extension to occiput  - BP at that time was 201/100 -- pt was sent to primary care clinic for urgent evaluation and meds were adjusted  - BP now under better control and headaches improved  - discussed importance of tight BP control   Ophthalmic Meds Ordered this visit:  Meds ordered this encounter  Medications  . Bevacizumab (AVASTIN) SOLN 1.25 mg       Return for f/u 6-7 weeks, BRVO OD, DFE, OCT.  There are no Patient Instructions on file for this visit.  This document serves as a record of services personally performed by Gardiner Sleeper, MD, PhD. It was created on their behalf by Leeann Must, [place], an ophthalmic technician. The creation of this record is the provider's dictation and/or activities during the visit.    Electronically signed by: Leeann Must, [place] 11.16.2021 4:43 PM   This document serves as a record of services personally performed by Gardiner Sleeper, MD, PhD. It was created on their behalf by San Jetty. Owens Shark, OA an ophthalmic technician. The creation of this record is the provider's  dictation and/or activities during the visit.    Electronically signed by: San Jetty. Owens Shark, [place] 11.17.2021 4:43 PM  Gardiner Sleeper, M.D., Ph.D. Diseases & Surgery of the Retina and [place] 11/13/2020   I have reviewed the above documentation for accuracy and completeness, and I agree with the above. Gardiner Sleeper, M.D., Ph.D. 11/13/20 4:43 PM   Abbreviations: M myopia (nearsighted); A astigmatism; H hyperopia (farsighted); P presbyopia; Mrx spectacle prescription;  CTL contact lenses; OD right eye; OS left eye; OU both eyes  XT exotropia; ET esotropia; PEK punctate epithelial keratitis; PEE punctate epithelial erosions; DES dry eye syndrome; MGD meibomian gland dysfunction; ATs artificial tears; PFAT's preservative free artificial tears; [place] nuclear sclerotic cataract; PSC posterior subcapsular cataract; ERM epi-retinal membrane; PVD posterior vitreous detachment; RD retinal detachment; DM diabetes mellitus; DR diabetic retinopathy; NPDR non-proliferative diabetic retinopathy; PDR proliferative diabetic retinopathy; CSME clinically significant macular edema; DME diabetic macular edema; dbh dot blot hemorrhages; CWS cotton wool spot; POAG primary open angle glaucoma; C/D cup-to-disc ratio; HVF humphrey visual field; GVF goldmann visual field; OCT optical coherence tomography; IOP intraocular pressure;  BRVO Branch retinal vein occlusion; CRVO central retinal vein occlusion; CRAO central retinal artery occlusion; BRAO branch retinal artery occlusion; RT retinal tear; SB scleral buckle; PPV pars plana vitrectomy; VH Vitreous hemorrhage; PRP panretinal laser photocoagulation; IVK intravitreal kenalog; VMT vitreomacular traction; MH Macular hole;  NVD neovascularization of the disc; NVE neovascularization elsewhere; AREDS age related eye disease study; ARMD age related macular degeneration; POAG primary open angle glaucoma; EBMD epithelial/anterior basement membrane  dystrophy; ACIOL anterior chamber intraocular lens; IOL intraocular lens; PCIOL posterior chamber intraocular lens; Phaco/IOL phacoemulsification with intraocular lens placement; [place] photorefractive keratectomy; LASIK laser assisted in situ keratomileusis; HTN hypertension; DM diabetes mellitus; COPD chronic obstructive pulmonary disease

## 2020-11-13 ENCOUNTER — Other Ambulatory Visit: Payer: Self-pay

## 2020-11-13 ENCOUNTER — Ambulatory Visit (INDEPENDENT_AMBULATORY_CARE_PROVIDER_SITE_OTHER): Payer: Medicare PPO | Admitting: Ophthalmology

## 2020-11-13 ENCOUNTER — Encounter (INDEPENDENT_AMBULATORY_CARE_PROVIDER_SITE_OTHER): Payer: Self-pay | Admitting: Ophthalmology

## 2020-11-13 DIAGNOSIS — H40003 Preglaucoma, unspecified, bilateral: Secondary | ICD-10-CM | POA: Diagnosis not present

## 2020-11-13 DIAGNOSIS — H35373 Puckering of macula, bilateral: Secondary | ICD-10-CM

## 2020-11-13 DIAGNOSIS — H40051 Ocular hypertension, right eye: Secondary | ICD-10-CM

## 2020-11-13 DIAGNOSIS — H40001 Preglaucoma, unspecified, right eye: Secondary | ICD-10-CM

## 2020-11-13 DIAGNOSIS — I10 Essential (primary) hypertension: Secondary | ICD-10-CM

## 2020-11-13 DIAGNOSIS — H35033 Hypertensive retinopathy, bilateral: Secondary | ICD-10-CM

## 2020-11-13 DIAGNOSIS — Z961 Presence of intraocular lens: Secondary | ICD-10-CM | POA: Diagnosis not present

## 2020-11-13 DIAGNOSIS — H25813 Combined forms of age-related cataract, bilateral: Secondary | ICD-10-CM

## 2020-11-13 DIAGNOSIS — H34831 Tributary (branch) retinal vein occlusion, right eye, with macular edema: Secondary | ICD-10-CM

## 2020-11-13 DIAGNOSIS — E119 Type 2 diabetes mellitus without complications: Secondary | ICD-10-CM

## 2020-11-13 DIAGNOSIS — H3581 Retinal edema: Secondary | ICD-10-CM

## 2020-11-13 DIAGNOSIS — R519 Headache, unspecified: Secondary | ICD-10-CM

## 2020-11-13 DIAGNOSIS — E039 Hypothyroidism, unspecified: Secondary | ICD-10-CM

## 2020-11-13 LAB — TSH: TSH: 5.39 mIU/L — ABNORMAL HIGH (ref 0.40–4.50)

## 2020-11-13 MED ORDER — BEVACIZUMAB CHEMO INJECTION 1.25MG/0.05ML SYRINGE FOR KALEIDOSCOPE
1.2500 mg | INTRAVITREAL | Status: AC | PRN
  Administered 2020-11-13: 1.25 mg via INTRAVITREAL

## 2020-11-13 MED ORDER — LEVOTHYROXINE SODIUM 75 MCG PO TABS: 75.0000 ug | ORAL_TABLET | Freq: Every day | ORAL | 1 refills | Status: DC

## 2020-12-09 ENCOUNTER — Telehealth: Payer: Self-pay | Admitting: *Deleted

## 2020-12-09 NOTE — Telephone Encounter (Signed)
Received Detailed Written Order Form for Freestyle libre 14 day.  [facility] had received it by mistake and sent it to [place] to fill out and sign and faxed back to Fax: [phone]  Filled out and placed in Dinah's folder to review and sign.

## 2020-12-11 ENCOUNTER — Other Ambulatory Visit: Payer: Self-pay | Admitting: Family Medicine

## 2020-12-11 DIAGNOSIS — I10 Essential (primary) hypertension: Secondary | ICD-10-CM

## 2020-12-18 ENCOUNTER — Encounter: Payer: Medicare PPO | Admitting: Counselor

## 2020-12-24 ENCOUNTER — Encounter: Payer: Self-pay | Admitting: Counselor

## 2020-12-25 ENCOUNTER — Encounter (INDEPENDENT_AMBULATORY_CARE_PROVIDER_SITE_OTHER): Payer: Medicare PPO | Admitting: Ophthalmology

## 2020-12-25 ENCOUNTER — Encounter: Payer: Medicare PPO | Admitting: Counselor

## 2020-12-25 NOTE — Progress Notes (Signed)
[facility] - Clinic Note  12/26/2020     CHIEF COMPLAINT Patient presents for Retina Follow Up   HISTORY OF PRESENT ILLNESS: Alejandra Davis is a 84 y.o. female who presents to the clinic today for:   HPI    Retina Follow Up    Patient presents with  CRVO/BRVO.  In right eye.  This started months ago.  Severity is moderate.  Duration of 6 weeks.  Since onset it is stable.  I, the attending physician,  performed the HPI with the patient and updated documentation appropriately.          Comments    84 y/o female pt here for 6 wk f/u for BRVO OD.  No change in [place] OU.  Denies pain, FOL, floaters.  Cosopt QD OU.  BS and A1C unknown.       Last edited by Rennis Chris, MD on 12/26/2020 10:28 AM. (History)       Referring physician: Caesar Bookman, NP [address] [place],  [place] [address]  HISTORICAL INFORMATION:   Selected notes from the MEDICAL RECORD NUMBER Referred by Dr. Joylene Igo for DM exam   CURRENT MEDICATIONS: Current Outpatient Medications (Ophthalmic Drugs)  Medication Sig  . dorzolamide-timolol (COSOPT) 22.3-6.8 MG/ML ophthalmic solution 1 drop 2 (two) times daily.   No current facility-administered medications for this visit. (Ophthalmic Drugs)   Current Outpatient Medications (Other)  Medication Sig  . amLODipine (NORVASC) 10 MG tablet TAKE 1 TABLET(10 MG) BY MOUTH DAILY  . amoxicillin-clavulanate (AUGMENTIN) 500-125 MG tablet Take 1 tablet (500 mg total) by mouth in the morning and at bedtime.  Marland Kitchen aspirin EC 81 MG tablet Take 81 mg by mouth daily.  . Lancets Ultra Thin 30G MISC 100 each by Does not apply route 3 (three) times a week.  . levothyroxine (SYNTHROID) 75 MCG tablet Take 1 tablet (75 mcg total) by mouth daily.  Marland Kitchen lisinopril (ZESTRIL) 2.5 MG tablet Take 1 tablet (2.5 mg total) by mouth daily.  . metFORMIN (GLUCOPHAGE) 500 MG tablet Take 500 mg by mouth daily.  . metoprolol succinate (TOPROL-XL) 50 MG 24 hr tablet TAKE 1  TABLET(50 MG) BY MOUTH DAILY  . metoprolol tartrate (LOPRESSOR) 50 MG tablet Take 50 mg by mouth daily.  Marland Kitchen saccharomyces boulardii (FLORASTOR) 250 MG capsule Take 1 capsule (250 mg total) by mouth 2 (two) times daily.   No current facility-administered medications for this visit. (Other)      REVIEW OF SYSTEMS: ROS    Positive for: Skin, Genitourinary, Musculoskeletal, Endocrine, Cardiovascular, Eyes   Negative for: Constitutional, Gastrointestinal, Neurological, HENT, Respiratory, Psychiatric, Allergic/Imm, Heme/Lymph   Last edited by Celine Mans, COA on 12/26/2020 10:13 AM. (History)       ALLERGIES Allergies  Allergen Reactions  . Donepezil Hcl     PAST MEDICAL HISTORY Past Medical History:  Diagnosis Date  . Chicken pox   . Cystocele   . Diabetes mellitus without complication (HCC)   . Glaucoma   . Hypertension   . Hypertensive retinopathy    OU  . Neuromuscular disorder (HCC)    Dementia   . Thyroid disease    hypothyroidism   Past Surgical History:  Procedure Laterality Date  . ABDOMINAL HYSTERECTOMY  1990   TAH BSO  . CATARACT EXTRACTION Bilateral    Dr. Alben Spittle  . EYE SURGERY Bilateral    Cat Sx OU  . OOPHORECTOMY     BSO  . Vaginal Bx  Papilloma    FAMILY HISTORY Family History  Problem Relation Age of Onset  . Sickle cell anemia Other   . Hypertension Mother   . Cancer Father        LIVER  . Heart disease Brother   . Cancer Brother        STOMACH    SOCIAL HISTORY Social History   Tobacco Use  . Smoking status: Never Smoker  . Smokeless tobacco: Never Used  Vaping Use  . Vaping Use: Never used  Substance Use Topics  . Alcohol use: No  . Drug use: No         OPHTHALMIC EXAM:  Base Eye Exam    Visual Acuity (Snellen - Linear)      Right Left   Dist [place] 20/40 +2 20/30 +2   Dist ph [place] NI 20/25 -       Tonometry (Tonopen, 10:18 AM)      Right Left   Pressure 13 12       Pupils      Dark Light Shape React APD    Right 3 2 Round Brisk None   Left 3 2 Round Brisk None       Visual Fields (Counting fingers)      Left Right    Full Full       Extraocular Movement      Right Left    Full, Ortho Full, Ortho       Neuro/Psych    Oriented x3: Yes   Mood/Affect: Normal       Dilation    Both eyes: 2.5% Phenylephrine @ 10:18 AM        Slit Lamp and Fundus Exam    Slit Lamp Exam      Right Left   Lids/Lashes Dermatochalasis - upper lid, Meibomian gland dysfunction Dermatochalasis - upper lid, Meibomian gland dysfunction   Conjunctiva/Sclera Melanosis, no injection Melanosis   Cornea Arcus, 1+ Punctate epithelial erosions, mild Anterior basement membrane dystrophy, Well healed cataract wounds, tr Descemet's folds tempoally Arcus, Inferior 1+ Punctate epithelial erosions; 2-3+ microcystic edema temporal with 2+ descemet folds, Keratic precipitates.  Well healed temporal cat sx incision.   Anterior Chamber Deep, no cell or flare Deep;  no cell or flare   Iris Round and dilated, No NVI Round and dilated, No NVI   Lens PC IOL in good position, trace Posterior capsular opacification Posterior chamber intraocular lens in good position.  1+ PCO.   Vitreous Vitreous syneresis Vitreous syneresis       Fundus Exam      Right Left   Disc Sharp rim, inferior notch, 2-3+ pallor, inf. Rim thinning, attenuated vessels superiorly 2+ pallor, Sharp rim, pink   C/D Ratio 0.7 0.7   Macula Flat, good foveal reflex, mild IRH superior macula--improving, stable improvement in Cystic changes Flat, good foveal reflex, mild RPE mottling and clumping, Epiretinal membrane, No heme or edema   Vessels Severe vascular attenuation greatest ST arcades, superior>inferior, BRVO, mild tortuousity Mild vascular attenuation, AV crossing changes   Periphery Attached, scattered IRH/DBH superior hemisphere - improving Attached, mild, scattered Reticular degeneration, No heme           IMAGING AND PROCEDURES  Imaging and  Procedures for 03/07/18  OCT, Retina - OU - Both Eyes       Right Eye Quality was good. Central Foveal Thickness: 257. Progression has been stable. Findings include no SRF, epiretinal membrane, normal foveal contour, no IRF (Trace cystic changes  temporal macula).   Left Eye Quality was good. Central Foveal Thickness: 255. Progression has been stable. Findings include abnormal foveal contour, epiretinal membrane, no IRF, no SRF, vitreomacular adhesion  (Blunted foveal depression--stable).   Notes *Images captured and stored on drive  Diagnosis / Impression:  ERM OU OD: Trace cystic changes temporal macula OS: very mild ERM with blunted foveal depression -- stable; mild VMA  Clinical management:  See below  Abbreviations: NFP - Normal foveal profile. CME - cystoid macular edema. PED - pigment epithelial detachment. IRF - intraretinal fluid. SRF - subretinal fluid. EZ - ellipsoid zone. ERM - epiretinal membrane. ORA - outer retinal atrophy. ORT - outer retinal tubulation. SRHM - subretinal hyper-reflective material         Intravitreal Injection, Pharmacologic Agent - OD - Right Eye       Time Out 12/26/2020. 10:17 AM. Confirmed correct patient, procedure, site, and patient consented.   Anesthesia Topical anesthesia was used. Anesthetic medications included Lidocaine 2%, Proparacaine 0.5%.   Procedure Preparation included 5% betadine to ocular surface, eyelid speculum. A (32g) needle was used.   Injection:  1.25 mg Bevacizumab (AVASTIN) 1.25mg /0.31mL SOLN   NDC: 54650-354-65, Lot: [id number], Expiration date: 01/27/2021   Route: Intravitreal, Site: Right Eye, Waste: 0.05 mL  Post-op Post injection exam found visual acuity of at least counting fingers. The patient tolerated the procedure well. There were no complications. The patient received written and verbal post procedure care education. Post injection medications were not given.                  ASSESSMENT/PLAN:    ICD-10-CM   1. Branch retinal vein occlusion of right eye with macular edema  H34.8310 Intravitreal Injection, Pharmacologic Agent - OD - Right Eye    Bevacizumab (AVASTIN) SOLN 1.25 mg  2. Retinal edema  H35.81 OCT, Retina - OU - Both Eyes  3. Diabetes mellitus type 2 without retinopathy (HCC)  E11.9   4. Epiretinal membrane (ERM) of both eyes  H35.373   5. Pseudophakia of both eyes  Z96.1   6. Ocular hypertension of right eye  H40.051   7. Glaucoma suspect of both eyes  H40.003   8. Essential hypertension  I10   9. Hypertensive retinopathy of both eyes  H35.033     1,2. BRVO with CME OD  - at presentation, BCVA 20/60 OD -- down from 20/30  - initial OCT w/ CME superior macula  - s/p IVA OD #1 (10.18.19), #2 (11.19.19), #3 (12.17.19), #4 (02.19.20), #5 (03.23.20), #6 (04.21.20), #7 (05.27.20), #8 (07.07.20), #9 (08.25.20), #10 (9.29.20), #11 (11.17.20), #12 (12.22.20), #13 (01.27.21), #14 (03.10.21), #15 (04.21.21), #16 (05.26.21), #17 (06.30.21), #18 (08.04.21), #19 (09.01.21), #20 (10.06.21), #21 (11.17.21)  - good response to medications, but held IVA in January 2020 due to cataract surgery  - today, BCVA 20/40  - OCT shows trace cystic changes temporal macula at 6 wk interval   - recommend IVA OD #22 today, 12.30.21  - pt wishes to proceed  - RBA of procedure discussed, questions answered  - informed consent obtained   - Avastin informed consent form signed and scanned on 01.27.2021  - see procedure note  - history of interval increase in IRF/cystic changes at 6+ weeks interval, but stable improvement in IRF/cystic changes at 6 wks x2  - f/u 7 weeks -- DFE/OCT, possible injection  3. Diabetes mellitus, type 2 without retinopathy  - The incidence, risk factors for progression, natural history and treatment options for  diabetic retinopathy  were discussed with patient.     - The need for close monitoring of blood glucose, blood pressure, and serum lipids,  avoiding cigarette or any type of tobacco, and the need for long term follow up was also discussed with patient.   4. Epiretinal membrane, OU  - relatively mild ERM OU -- blunted central foveal depression  - OD with mild cystic changes 2/2 BRVO as above; OS without cystic changes or edema  - discussed findings and prognosis   - monitor  5. Pseudophakia OU  - s/p CE/IOL OS 11.13.19 w/ Dr. Alben Spittle  - s/p CE/IOL OD 01.16.20 w/ Dr. Alben Spittle  - beautiful surgeries -- IOLs in perfect position  - healing well post-operatively  - IRF/CME OD may have been partly due to post op CME ([place]) in addition to BRVO  6,7. Ocular hypertension / Glaucoma suspect OD>OS  - IOP ~25 OD, 20 OS at prior clinic visits  - today IOP 13,12  - denies any family hx of glaucoma  - under the expert care of Dr. Alben Spittle  - uses Cosopt BID OU  8,9. Hypertensive retinopathy OU  - pt presented to 6.24.2020 visit urgently for right sided headache above right eye with extension to occiput  - BP at that time was 201/100 -- pt was sent to primary care clinic for urgent evaluation and meds were adjusted  - BP now under better control and headaches improved  - discussed importance of tight BP control  Ophthalmic Meds Ordered this visit:  Meds ordered this encounter  Medications  . Bevacizumab (AVASTIN) SOLN 1.25 mg      Return in about 7 weeks (around 02/13/2021).  There are no Patient Instructions on file for this visit.  This document serves as a record of services personally performed by Karie Chimera, MD, PhD. It was created on their behalf by Glee Arvin. Manson Passey, OA an ophthalmic technician. The creation of this record is the provider's dictation and/or activities during the visit.    Electronically signed by: Glee Arvin. [place], [place] 12.30.2021 4:12 PM  Karie Chimera, M.D., Ph.D. Diseases & Surgery of the Retina and Vitreous [facility] 12/26/2020   I have reviewed the above  documentation for accuracy and completeness, and I agree with the above. Karie Chimera, M.D., Ph.D. 12/26/20 4:12 PM   Abbreviations: M myopia (nearsighted); A astigmatism; H hyperopia (farsighted); P presbyopia; Mrx spectacle prescription;  CTL contact lenses; OD right eye; OS left eye; OU both eyes  XT exotropia; ET esotropia; PEK punctate epithelial keratitis; PEE punctate epithelial erosions; DES dry eye syndrome; MGD meibomian gland dysfunction; ATs artificial tears; PFAT's preservative free artificial tears; NSC nuclear sclerotic cataract; PSC posterior subcapsular cataract; ERM epi-retinal membrane; PVD posterior vitreous detachment; RD retinal detachment; DM diabetes mellitus; DR diabetic retinopathy; NPDR non-proliferative diabetic retinopathy; PDR proliferative diabetic retinopathy; CSME clinically significant macular edema; DME diabetic macular edema; dbh dot blot hemorrhages; CWS cotton wool spot; POAG primary open angle glaucoma; C/D cup-to-disc ratio; HVF humphrey visual field; GVF goldmann visual field; OCT optical coherence tomography; IOP intraocular pressure; BRVO Branch retinal vein occlusion; CRVO central retinal vein occlusion; CRAO central retinal artery occlusion; BRAO branch retinal artery occlusion; RT retinal tear; SB scleral buckle; PPV pars plana vitrectomy; VH Vitreous hemorrhage; PRP panretinal laser photocoagulation; IVK intravitreal kenalog; VMT vitreomacular traction; MH Macular hole;  NVD neovascularization of the disc; NVE neovascularization elsewhere; AREDS age related eye disease study; ARMD age related macular  degeneration; POAG primary open angle glaucoma; EBMD epithelial/anterior basement membrane dystrophy; ACIOL anterior chamber intraocular lens; IOL intraocular lens; PCIOL posterior chamber intraocular lens; Phaco/IOL phacoemulsification with intraocular lens placement; [place] photorefractive keratectomy; LASIK laser assisted in situ keratomileusis; HTN hypertension; DM  diabetes mellitus; COPD chronic obstructive pulmonary disease

## 2020-12-26 ENCOUNTER — Other Ambulatory Visit: Payer: Self-pay

## 2020-12-26 ENCOUNTER — Encounter: Payer: Medicare PPO | Admitting: Counselor

## 2020-12-26 ENCOUNTER — Encounter (INDEPENDENT_AMBULATORY_CARE_PROVIDER_SITE_OTHER): Payer: Self-pay | Admitting: Ophthalmology

## 2020-12-26 ENCOUNTER — Ambulatory Visit (INDEPENDENT_AMBULATORY_CARE_PROVIDER_SITE_OTHER): Payer: Medicare PPO | Admitting: Ophthalmology

## 2020-12-26 DIAGNOSIS — H34831 Tributary (branch) retinal vein occlusion, right eye, with macular edema: Secondary | ICD-10-CM

## 2020-12-26 DIAGNOSIS — E119 Type 2 diabetes mellitus without complications: Secondary | ICD-10-CM

## 2020-12-26 DIAGNOSIS — H35373 Puckering of macula, bilateral: Secondary | ICD-10-CM

## 2020-12-26 DIAGNOSIS — H40051 Ocular hypertension, right eye: Secondary | ICD-10-CM | POA: Diagnosis not present

## 2020-12-26 DIAGNOSIS — I10 Essential (primary) hypertension: Secondary | ICD-10-CM

## 2020-12-26 DIAGNOSIS — H35033 Hypertensive retinopathy, bilateral: Secondary | ICD-10-CM

## 2020-12-26 DIAGNOSIS — Z961 Presence of intraocular lens: Secondary | ICD-10-CM | POA: Diagnosis not present

## 2020-12-26 DIAGNOSIS — H40003 Preglaucoma, unspecified, bilateral: Secondary | ICD-10-CM | POA: Diagnosis not present

## 2020-12-26 DIAGNOSIS — H3581 Retinal edema: Secondary | ICD-10-CM

## 2020-12-26 MED ORDER — BEVACIZUMAB CHEMO INJECTION 1.25MG/0.05ML SYRINGE FOR KALEIDOSCOPE
1.2500 mg | INTRAVITREAL | Status: AC | PRN
  Administered 2020-12-26: 1.25 mg via INTRAVITREAL

## 2021-01-01 ENCOUNTER — Other Ambulatory Visit: Payer: Self-pay

## 2021-01-01 ENCOUNTER — Encounter: Payer: Self-pay | Admitting: Family

## 2021-01-01 ENCOUNTER — Telehealth: Payer: Self-pay | Admitting: *Deleted

## 2021-01-01 ENCOUNTER — Ambulatory Visit (INDEPENDENT_AMBULATORY_CARE_PROVIDER_SITE_OTHER): Payer: Medicare PPO | Admitting: Family

## 2021-01-01 VITALS — BP 130/80 | HR 70 | Temp 97.8°F | Resp 16 | Ht 63.0 in | Wt 120.0 lb

## 2021-01-01 DIAGNOSIS — R399 Unspecified symptoms and signs involving the genitourinary system: Secondary | ICD-10-CM

## 2021-01-01 LAB — POCT URINALYSIS DIPSTICK
Bilirubin, UA: NEGATIVE
Glucose, UA: NEGATIVE
Ketones, UA: NEGATIVE
Nitrite, UA: NEGATIVE
Protein, UA: POSITIVE — AB
Spec Grav, UA: 1.02 (ref 1.010–1.025)
Urobilinogen, UA: NEGATIVE E.U./dL — AB
pH, UA: 6.5 (ref 5.0–8.0)

## 2021-01-01 MED ORDER — LISINOPRIL 2.5 MG PO TABS: 2.5000 mg | ORAL_TABLET | Freq: Every day | ORAL | 1 refills | Status: DC

## 2021-01-01 MED ORDER — METFORMIN HCL 500 MG PO TABS: 500.0000 mg | ORAL_TABLET | Freq: Every day | ORAL | 1 refills | Status: DC

## 2021-01-01 MED ORDER — METOPROLOL TARTRATE 50 MG PO TABS: 50.0000 mg | ORAL_TABLET | Freq: Every day | ORAL | 1 refills | Status: DC

## 2021-01-01 NOTE — Progress Notes (Signed)
Provider: Marlowe Sax FNP-C  Balraj Brayfield, Nelda Bucks, NP  Patient Care Team: Jermery Caratachea, Nelda Bucks, NP as PCP - General (Family Medicine)  Extended Emergency Contact Information Primary Emergency Contact: Pittmon,[name] Address: [address]          [place], [place] [address] [place] Phone: [phone] Relation: Daughter Secondary Emergency Contact: Samaan,Mitchell Address: [place]          [place] [address] [place] Mobile Phone: [phone] Relation: Son  Code Status:  Full Code  Goals of care: Advanced Directive information Advanced Directives 01/01/2021  Does Patient Have a Medical Advance Directive? Yes  Type of Paramedic of [place];Living will  Does patient want to make changes to medical advance directive? No - Patient declined  Copy of [place] in Chart? Yes - validated most recent copy scanned in chart (See row information)  Would patient like information on creating a medical advance directive? -     Chief Complaint  Patient presents with  . Acute Visit    Possible UTI    HPI:  Pt is a 85 y.o. female seen today for an acute visit for evaluation of possible urinary tract infection.Patient's Daughter Sherlyn Hay called office and reported patient was more confused thinks might be having another episode of urinary tract infection.she is confused about the medication states not taking all of them on the list request refills for all medication.Care giver states medication are in Pill boxes will check then notify provider if refills required. She denies any fever,chills.urine frequency,urgency,dysuria,N/V,abdominal pain or back pain.   Past Medical History:  Diagnosis Date  . Chicken pox   . Cystocele   . Diabetes mellitus without complication ([place])   . Glaucoma   . Hypertension   . Hypertensive retinopathy    OU  . Neuromuscular disorder ([place])    Dementia   . Thyroid disease     hypothyroidism   Past Surgical History:  Procedure Laterality Date  . ABDOMINAL HYSTERECTOMY  1990   TAH BSO  . CATARACT EXTRACTION Bilateral    Dr. [name]  . EYE SURGERY Bilateral    Cat Sx OU  . OOPHORECTOMY     BSO  . Vaginal Bx     Papilloma    Allergies  Allergen Reactions  . Donepezil Hcl     Outpatient Encounter Medications as of 01/01/2021  Medication Sig  . amLODipine (NORVASC) 10 MG tablet TAKE 1 TABLET(10 MG) BY MOUTH DAILY  . aspirin EC 81 MG tablet Take 81 mg by mouth daily.  . dorzolamide-timolol (COSOPT) 22.3-6.8 MG/ML ophthalmic solution 1 drop 2 (two) times daily.  . Lancets Ultra Thin 30G MISC 100 each by Does not apply route 3 (three) times a week.  . levothyroxine (SYNTHROID) 75 MCG tablet Take 1 tablet (75 mcg total) by mouth daily.  Marland Kitchen lisinopril (ZESTRIL) 2.5 MG tablet Take 1 tablet (2.5 mg total) by mouth daily.  . metFORMIN (GLUCOPHAGE) 500 MG tablet Take 500 mg by mouth daily.  . metoprolol tartrate (LOPRESSOR) 50 MG tablet Take 50 mg by mouth daily.  Marland Kitchen saccharomyces boulardii (FLORASTOR) 250 MG capsule Take 1 capsule (250 mg total) by mouth 2 (two) times daily.  . [DISCONTINUED] amoxicillin-clavulanate (AUGMENTIN) 500-125 MG tablet Take 1 tablet (500 mg total) by mouth in the morning and at bedtime.  . [DISCONTINUED] metoprolol succinate (TOPROL-XL) 50 MG 24 hr tablet TAKE 1 TABLET(50 MG) BY MOUTH DAILY   No facility-administered  encounter medications on file as of 01/01/2021.    Review of Systems  Constitutional: Negative for appetite change, chills, fatigue and fever.  HENT: Negative for congestion, rhinorrhea, sinus pressure, sinus pain, sneezing and sore throat.   Eyes: Negative for discharge, redness, itching and visual disturbance.  Respiratory: Negative for cough, chest tightness, shortness of breath and wheezing.   Cardiovascular: Negative for chest pain, palpitations and leg swelling.  Gastrointestinal: Negative for abdominal distention,  abdominal pain, constipation, diarrhea, nausea and vomiting.  Genitourinary: Negative for difficulty urinating, dysuria, flank pain, frequency and urgency.  Musculoskeletal: Negative for arthralgias and gait problem.  Skin: Negative for color change, pallor and rash.  Neurological: Negative for dizziness, speech difficulty, weakness, light-headedness and headaches.  Psychiatric/Behavioral: Positive for confusion. Negative for agitation and sleep disturbance. The patient is not nervous/anxious.     Immunization History  Administered Date(s) Administered  . Fluad Quad(high Dose 65+) 09/17/2020  . Influenza, High Dose Seasonal PF 12/20/2017, 09/25/2019  . Influenza-Unspecified 12/20/2017  . PFIZER SARS-COV-2 Vaccination 01/05/2020, 02/05/2020  . Pneumococcal Polysaccharide-23 03/13/2020  . Zoster Recombinat (Shingrix) 02/07/2018, 05/18/2018   Pertinent  Health Maintenance Due  Topic Date Due  . OPHTHALMOLOGY EXAM  12/18/2020  . FOOT EXAM  02/22/2021  . HEMOGLOBIN A1C  03/10/2021  . PNA vac Low Risk Adult (2 of 2 - PCV13) 03/13/2021  . INFLUENZA VACCINE  Completed  . DEXA SCAN  Completed   Fall Risk  01/01/2021 09/17/2020 09/06/2020 06/21/2020 03/13/2020  Falls in the past year? 0 0 0 0 0  Number falls in past yr: 0 0 0 0 0  Injury with Fall? 0 0 0 0 0   Functional Status Survey:    Vitals:   01/01/21 0927  BP: 130/80  Pulse: 70  Resp: 16  Temp: 97.8 F (36.6 C)  SpO2: 98%  Weight: 120 lb (54.4 kg)  Height: 5\' 3"  (1.6 m)   Body mass index is 21.26 kg/m. Physical Exam Vitals reviewed.  Constitutional:      General: She is not in acute distress.    Appearance: She is not ill-appearing.  HENT:     Head: Normocephalic.     Mouth/Throat:     Mouth: Mucous membranes are moist.     Pharynx: Oropharynx is clear. No oropharyngeal exudate or posterior oropharyngeal erythema.  Eyes:     General: No scleral icterus.       Right eye: No discharge.        Left eye: No discharge.      Extraocular Movements: Extraocular movements intact.     Conjunctiva/sclera: Conjunctivae normal.     Pupils: Pupils are equal, round, and reactive to light.  Neck:     Vascular: No carotid bruit.  Cardiovascular:     Rate and Rhythm: Normal rate and regular rhythm.     Pulses: Normal pulses.     Heart sounds: Normal heart sounds. No murmur heard. No gallop.   Pulmonary:     Effort: Pulmonary effort is normal. No respiratory distress.     Breath sounds: Normal breath sounds. No wheezing, rhonchi or rales.  Chest:     Chest wall: No tenderness.  Abdominal:     General: Bowel sounds are normal. There is no distension.     Palpations: Abdomen is soft. There is no mass.     Tenderness: There is no abdominal tenderness. There is no right CVA tenderness, left CVA tenderness, guarding or rebound.  Musculoskeletal:  General: No swelling or tenderness. Normal range of motion.     Cervical back: Normal range of motion. No rigidity or tenderness.     Right lower leg: No edema.     Left lower leg: No edema.  Lymphadenopathy:     Cervical: No cervical adenopathy.  Skin:    General: Skin is warm.     Coloration: Skin is not pale.     Findings: No bruising, erythema or rash.  Neurological:     Mental Status: She is alert. Mental status is at baseline.     Cranial Nerves: No cranial nerve deficit.     Motor: No weakness.     Gait: Gait normal.  Psychiatric:        Mood and Affect: Mood normal.        Behavior: Behavior normal.        Thought Content: Thought content normal.        Judgment: Judgment normal.     Labs reviewed: Recent Labs    03/13/20 1405 06/21/20 1200 09/10/20 0802  NA 141 141 141  K 4.4 4.8 4.4  CL 105 104 107  CO2 24 26 26   GLUCOSE 99 114 132*  BUN 20 15 21   CREATININE 1.18* 1.18* 1.37*  CALCIUM 10.1 9.9 9.7   Recent Labs    03/13/20 1405 09/10/20 0802  AST 19 18  ALT 12 10  BILITOT 0.4 0.6  PROT 7.8 7.3   Recent Labs    03/13/20 1405  09/06/20 1630 09/10/20 0802  WBC 5.7 5.4 4.5  NEUTROABS 3,340 3,035 2,673  HGB 13.8 13.4 13.8  HCT 42.9 42.4 44.2  MCV 83.1 85.0 85.0  PLT 148 274 281   Lab Results  Component Value Date   TSH 5.39 (H) 11/12/2020   Lab Results  Component Value Date   HGBA1C 6.6 (H) 09/10/2020   Lab Results  Component Value Date   CHOL 224 (H) 09/10/2020   HDL 86 09/10/2020   LDLCALC 122 (H) 09/10/2020   TRIG 67 09/10/2020   CHOLHDL 2.6 09/10/2020    Significant Diagnostic Results in last 30 days:  Intravitreal Injection, Pharmacologic Agent - OD - Right Eye  Result Date: 12/26/2020 Time Out 12/26/2020. 10:17 AM. Confirmed correct patient, procedure, site, and patient consented. Anesthesia Topical anesthesia was used. Anesthetic medications included Lidocaine 2%, Proparacaine 0.5%. Procedure Preparation included 5% betadine to ocular surface, eyelid speculum. A (32g) needle was used. Injection: 1.25 mg Bevacizumab (AVASTIN) 1.25mg /0.67mL SOLN   NDC: 12/28/2020, Lot0m, Expiration date: 01/27/2021   Route: Intravitreal, Site: Right Eye, Waste: 0.05 mL Post-op Post injection exam found visual acuity of at least counting fingers. The patient tolerated the procedure well. There were no complications. The patient received written and verbal post procedure care education. Post injection medications were not given.   OCT, Retina - OU - Both Eyes  Result Date: 12/26/2020 Right Eye Quality was good. Central Foveal Thickness: 257. Progression has been stable. Findings include no SRF, epiretinal membrane, normal foveal contour, no IRF (Trace cystic changes temporal macula). Left Eye Quality was good. Central Foveal Thickness: 255. Progression has been stable. Findings include abnormal foveal contour, epiretinal membrane, no IRF, no SRF, vitreomacular adhesion  (Blunted foveal depression--stable). Notes *Images captured and stored on drive Diagnosis / Impression: ERM OU OD: Trace cystic changes temporal  macula OS: very mild ERM with blunted foveal depression -- stable; mild VMA Clinical management: See below Abbreviations: NFP - Normal foveal profile. CME - cystoid  macular edema. PED - pigment epithelial detachment. IRF - intraretinal fluid. SRF - subretinal fluid. EZ - ellipsoid zone. ERM - epiretinal membrane. ORA - outer retinal atrophy. ORT - outer retinal tubulation. SRHM - subretinal hyper-reflective material   Assessment/Plan  Symptoms of urinary tract infection Increase confusion reported.VSS. Negative exam findings. - POC Urinalysis Dipstick urine specimen yellow,cloudy,with large blood with large leukocytes.will send for culture aware it takes 3 days then will call her with results. - Encouraged to increase fluid intake to 6-8 glasses daily - Notify provider if symptoms worsen or fail to improve - Culture, Urine    Family/ staff Communication: Reviewed plan of care with patient and care giver   Labs/tests ordered: - Culture, Urine  Next Appointment: As needed if symptoms worsen or fail to improve.   [name], NP

## 2021-01-01 NOTE — Telephone Encounter (Signed)
Patient notified and Rx's sent to pharmacy.

## 2021-01-01 NOTE — Telephone Encounter (Signed)
Patient had an appointment today and was told to call back with her medications.   Patient IS NOT taking Lisinopril, Metformin or Metoprolol that is listed in her current medication list.   Patient stated that if you want her to take these medications she will need a Rx sent to her Pharmacy [facility].   Please Advise.

## 2021-01-01 NOTE — Telephone Encounter (Signed)
Continue taking lisinopril,metformin and metoprolol as directed.

## 2021-01-01 NOTE — Patient Instructions (Signed)
Urine specimen was cloudy in color please increase your water intake to 6-8 glasses daily. - Urine specimen send for culture will call you in 3 days with results.  - Notify provider if symptoms worsen or fail to improve

## 2021-01-02 ENCOUNTER — Encounter: Payer: Medicare PPO | Admitting: Counselor

## 2021-01-03 ENCOUNTER — Other Ambulatory Visit: Payer: Self-pay

## 2021-01-03 LAB — URINE CULTURE
MICRO NUMBER:: 11388798
SPECIMEN QUALITY:: ADEQUATE

## 2021-01-03 MED ORDER — AMOXICILLIN-POT CLAVULANATE 875-125 MG PO TABS: 1.0000 | ORAL_TABLET | Freq: Two times a day (BID) | ORAL | 0 refills | Status: DC

## 2021-01-03 MED ORDER — SACCHAROMYCES BOULARDII 250 MG PO CAPS: 250.0000 mg | ORAL_CAPSULE | Freq: Two times a day (BID) | ORAL | 0 refills | Status: DC

## 2021-01-07 ENCOUNTER — Telehealth: Payer: Self-pay

## 2021-01-07 NOTE — Telephone Encounter (Signed)
Patient called with her aid to ask about the color of her pills that they had changed I explained that sometimes the color would be different but I didn't have a lot of knowledge about this and she could talk with her Pharmacist about this

## 2021-01-07 NOTE — Telephone Encounter (Signed)
Routed to Dinah Ngetich NP  

## 2021-01-07 NOTE — Telephone Encounter (Signed)
Patients daughter Narda Rutherford called to ask for a prior authorization before the neurologist will see her mother said it's also for insurance purpose also   Please Advise

## 2021-01-07 NOTE — Telephone Encounter (Signed)
Please verify whether  Neurologist office call or PCP.If required from PCP then Please call for prior authorization.

## 2021-01-08 NOTE — Telephone Encounter (Signed)
Noted thank  You

## 2021-01-08 NOTE — Telephone Encounter (Signed)
Our office is responsible for getting prior authorization. I spoke with Kathyrn Lass, [facility] referral coordinator this morning and she is in the process of obtaining prior authorization. She spoke with daughter, Sherlyn Hay this morning regarding this.

## 2021-01-13 ENCOUNTER — Other Ambulatory Visit: Payer: Self-pay

## 2021-01-15 ENCOUNTER — Other Ambulatory Visit: Payer: Self-pay

## 2021-01-15 ENCOUNTER — Other Ambulatory Visit: Payer: Medicare PPO

## 2021-01-15 DIAGNOSIS — E039 Hypothyroidism, unspecified: Secondary | ICD-10-CM | POA: Diagnosis not present

## 2021-01-16 LAB — TSH: TSH: 1.49 mIU/L (ref 0.40–4.50)

## 2021-01-17 NOTE — Telephone Encounter (Signed)
Lattie Haw please follow-up on PA for referral/appointment   You can reply direct to patients daughter or send message to me and I will copy and paste to her daughter.  Thanks,  CB

## 2021-01-21 ENCOUNTER — Telehealth: Payer: Self-pay

## 2021-01-21 NOTE — Telephone Encounter (Signed)
Spoke with pts daughter Sherlyn Hay & updated her on info received from insurance that NO auth is required for the 10 codes received from [facility].   All ref#'s & phone contact #'s have been documented in pts referral notes. Pt is approved to continue her Neuro psych testing appt scheduled with Dr [name] on 01/29/21.   Thanks, Kathyrn Lass

## 2021-01-21 NOTE — Telephone Encounter (Signed)
Alejandra Davis would like for you to return her call as soon as you come in today please. Her contact number is [phone]

## 2021-01-29 ENCOUNTER — Encounter: Payer: Self-pay | Admitting: Counselor

## 2021-01-29 ENCOUNTER — Ambulatory Visit: Payer: Medicare PPO | Admitting: Psychology

## 2021-01-29 ENCOUNTER — Other Ambulatory Visit: Payer: Self-pay

## 2021-01-29 ENCOUNTER — Ambulatory Visit: Payer: Medicare PPO | Admitting: Counselor

## 2021-01-29 DIAGNOSIS — G301 Alzheimer's disease with late onset: Secondary | ICD-10-CM

## 2021-01-29 DIAGNOSIS — F0281 Dementia in other diseases classified elsewhere with behavioral disturbance: Secondary | ICD-10-CM

## 2021-01-29 DIAGNOSIS — I6781 Acute cerebrovascular insufficiency: Secondary | ICD-10-CM

## 2021-01-29 DIAGNOSIS — F09 Unspecified mental disorder due to known physiological condition: Secondary | ICD-10-CM

## 2021-01-29 NOTE — Progress Notes (Signed)
   Psychometrist Note   Cognitive testing was administered to Alejandra Davis by Milana Kidney, B.S. (Technician) under the supervision of [name], Psy.D., ABN. Alejandra Davis was able to tolerate all test procedures. Dr. [name] met with the patient as needed to manage any emotional reactions to the testing procedures. Rest breaks were offered.    The battery of tests administered was selected by Dr. [name] with consideration to the patient's current level of functioning, the nature of her symptoms, emotional and behavioral responses during the interview, level of literacy, observed level of motivation/effort, and the nature of the referral question. This battery was communicated to the psychometrist. Communication between Dr. [name] and the psychometrist was ongoing throughout the evaluation and Dr. [name] was immediately accessible at all times. Dr. [name] provided supervision to the technician on the date of this service, to the extent necessary to assure the quality of all services provided.    Alejandra Davis will return in approximately one week for an interactive feedback session with Dr. [name], at which time test performance, clinical impressions, and treatment recommendations will be reviewed in detail. The patient understands she can contact our office should she require our assistance before this time.   A total of 70 minutes of billable time were spent with Alejandra Davis by the technician, including test administration and scoring time. Billing for these services is reflected in Dr. Les Pou note.   This note reflects time spent with the psychometrician and does not include test scores, clinical history, or any interpretations made by Dr. [name]. The full report will follow in a separate note.
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
## 2021-01-30 NOTE — Progress Notes (Signed)
[facility]  Patient Name: Alejandra Davis MRN: [id number] Date of Birth: [birth year] Age: 85 y.o. Education: 14 years  Clinical Impressions  Alejandra Davis is a 85 y.o., right-hand dominant, divorced woman with a history of HTN, HLD, CKD III, DMII, hypothyroidism and memory and thinking problems over perhaps the past 10 years. She was referred for evaluation by Marlowe Sax, NP at [facility]. Her daughter Ashby Dawes and niece Santiago Glad first noticed her problems about 10 years ago, the changes started with mild memory loss. They have worsened over time and at present, she has rapid forgetting of information, confusion vs. delusion (does not recall that her daughter died 70 years ago), and as of late when she has been symptomatic of her UTI, she has been agitated with some mild wandering. She gets worked up over things, particularly financial things (used to be a Soil scientist). She is getting in-home help 5 days a week for several hours a day, although she is still driving, is still involved in management of her finances, and her daughters are getting concerned about those things. Her MRI shows a moderate, early confluent burden of leukoaraiosis, a tiny hemipontine infarct, and mild to moderate volume loss with subtle impression of a bit more in the mesial temporal areas.   On neuropsychological testing, Alejandra Davis scored at an average level for expected overall abilities and an extremely low level in terms of overall current cognitive function. She demonstrated profound memory storage problems with low levels of initial encoding and virtually no information retained across time with no benefit to recognition cuing. She also has weak visual object confrontation naming, unusually to extremely low verbal fluency, and additional low scores on measures of processing speed and select measures of executive function. Digit repetition forward (a measure of basic  attention) and visuospatial functioning presented as relative strengths, with overall average scores in those areas. She also did well on the challenging trail making test B. She screened positive for the presence of depression on the GDS and her informants rated her as functioning at a mild dementia level. I was able to rate a CDR for her and would put her in the late mild to early moderate range.   Alejandra Davis is thus manifesting a clinical dementia syndrome that is likely at a mild to moderate level of progression. Mild seems the best standard because she may still have some minor interference from her UTI. She does have some vascular disease, but her demographics, profound memory problems, and other cortical features suggest that this is likely due to AD. Her imaging is also weakly supportive, given subtle impression of more atrophy in the temporal areas. There may be a more minor vascular contributor. I think that much of her agitation is the result of difficulties directing herself throughout the day, understimulation, and need for increased support, which I will discuss with her family. I do not think that she is safe to drive at this time and it sounds like she may be starting to have issues in that regard. She had adverse reactions to donepezil, could consider an alternative anti dementia medication. Antidepressant medication could also be considered, it sounds like she may have some agitation at "baseline."   Diagnostic Impressions: Alzheimer's disease, late onset Cerebrovascular small vessel disease  Recommendations to be discussed with patient  Your performance and presentation on assessment were consistent with significant cognitive impairments, with the most notable issues being in memory and on language measures. You  also had scattered low scores in other areas. Visual spatial functioning (e.g. processing visual information) and attention presented as areas of relative strength for you. Given  your clinical history and the fact that you now require help in several areas of daily-living, I think that you have reached a dementia level of function. The level of your condition is likely mild to moderate, and you may still have some interference from your UTI, so mild presents as the best severity level.   Dementia refers to a group of syndromes where multiple areas of ability are damaged in the brain, such as memory, thinking, judgment, and behavior, and most commonly refers to age related causes of dementia that cause worsening in these abilities over time. Alzheimer's disease is the most common form of dementia in people over the age of 77. Not all dementias are Alzheimer's disease, but all Alzheimer's disease is dementia. When dementia is due to an underlying condition affecting the brain, such as Alzheimer's disease, there is progression over time, which typically proceeds gradually over many years.   In your case, I think that your dementia is likely due to the most common age-related cause of cognitive decline, namely Alzheimer's disease. You do have some wear and tear on small blood vessels in the brain also and that may be contributing, but I do not think that is driving your problems.   Dementia is (somewhat arbitrarily) divided up into three different stages: Mild, Moderate, and Severe. These stages are not so much discrete stages as they are points on a continuum of severity. These stages govern the types of behavior you can expect, the level of care someone needs, and necessary supports. In the mild stages, difficulties are typically limited to cognitive problems, such as forgetfulness or difficulties coming up with the right word. Individuals are often able to remain at home, safely operate a motor vehicle, and changes may not be obvious to the casual observer. They may have difficulties with more complex things such as managing finances, with driving, and with work if they are still  working. These individuals are often capable of living independently with minimal assistance, but do require some supervision with more complex tasks such as managing finances, arranging and remembering to take medications, and reporting symptoms at doctors appointments. In the moderate stage, individuals with dementia eventually begin to have increasing difficulties with more basic activities (e.g., light housework, assembling a balanced meal, caring for personal hygiene and appearance). Individuals in the moderate stages also sometimes develop delusions (I.e., false beliefs), such as thinking that they have been visited by family members who are deceased, that their house is not their home, and the like. Individuals at this level of progression are not safe to operate a motor vehicle, should not be significantly involved in financial or medical decisions other than expressing their preferences, and should not be left alone for extended periods of time. They should not be left alone at all if there are problematic behaviors such as wandering. Individuals in the severe stage require extensive care and assistance, such as around the clock supervision or a skilled nursing level facility.   In your case, I think you are in the late mild stage. This is the point at which it typically becomes unsafe to drive and I am concerned about your driving and suggest that you stop at this time. I am also concerned about your involvement in financial self-management and would suggest that your daughter exercise substantial control over bill payment and  the like. It sounds like this is becoming a source of anger and agitation for you. If you are reluctant to accept help, then it may be appropriate for your daughter to consult with an elder law attorney.   The mainstay of dementia care is supportive in nature, which means providing help with things that have become difficult for you to do, such as finances and the like. Your  family are already doing a great job. One thing that also becomes challenging is figuring out what to do during the day. We are all confronted by dozens if not hundreds of choices every day and those become more challenging for someone with dementia, which can lead to agitation. I would therefore suggest that you develop a routine, increase the level of in home support, or if you are still having a hard time making it even with a high level of in home support that alternative living arrangements be considered (such as assisted living or even skilled nursing).   There are many things that you can do that are likely to contribute to better functioning, better behavior, and more positive interactions with your loved one. Among these is establishing a routine that supports meaningful behaviors. This means planning things out, intentionally, which can relieve the burden of decision making from the demented individual, lend a sense of comfort and predictability to the day, and also make sure basic needs such as eating, hygiene, and engaging hobbies are met by incorporating these things into the routine. Establishing a routine can be challenging but after an initial period, it is often one of the most helpful interventions. Plan your routine formally, with times and specific activities that should be completed (e.g., 8am "eat breakfast", 9:30am "morning walk", etc.). You can also plan in flex time or leisure time to avoid from making the routine overly rigid or restrictive.   Instead of medications, I recommend behavioral strategies for dealing with the behavioral and psychological issues that can accompany dementia. Things like agitation, wandering, and anxiety can often be improved or eliminated using the "three R's." Redirection (help distract your loved one by focusing their attention on something else, moving them to a new environment, or otherwise engaging them in something other than what is distressing to  them), Reassurance (reassure them that you are there to take care of them and that there is nothing they need to be worried about), and Reconsidering (consider the situation from their perspective and try to identify if there is something about the situation or environment that may be triggering their reaction).  You tried donepezil and did not have a good reaction, although there are other medications that could be considered. You could talk with Marlowe Sax, NP about this.   You may also talk with Webb Silversmith about an antidepressant, which can sometimes be helpful in agitation and you did screen in the positive range for depressive symptoms.   If you wish, you may return for re-evaluation in one to two years time to follow your progress and see how things are changing over time.   Test Findings  Test scores are summarized in additional documentation associated with this encounter. Test scores are relative to age, gender, and educational history as available and appropriate. There were no concerns about performance validity despite anomalous performance on one indicator. In this patient's case, her anomalous validity score is likely due to significant cognitive impairment.   General Intellectual Functioning/Achievement:  Performance on single word reading was average, which presents as a  reasonable standard of comparison for this patient's cognitive test data.   Attention and Processing Efficiency: Performance on indicators of attention was low average overall, although there were low scores in this domain. She performed well on a measure of simple digit repetition. By contrast, her timed number-symbol coding was unusually low.   Language: Performance on language measures was low with weak visual object confrontation naming on the NAB naming test. She got most of the items she missed with phonemic cues, so I think this is a word retrieval as opposed to premorbid knowledge issue. Her naming was within  normal limits on the RBANS but that is a very easy measure. Semantic fluency was extremely low for fruits and vegetables and animals. Phonemic fluency in response to the letters F-A-S was unusually low.   Visuospatial Function: Performance on measures of visuospatial and constructional function represented an area of relative strength for this patient who achieved an average score, with good nearly errorless constructional performance and an average score on judgment of angular line orientations.   Learning and Memory: Measures of learning and memory reflected significant memory storage problems. She had greatly diminished initial encoding of information and virtually no retention of information across time. There was some minimal benefit to recognition cuing but her score was still extremely low.   In the verbal realm, she learned 1, 2, 3, and 4 words of a 10-item list across four repetitions but then did not recall any of them on delayed recall. Her scores were extremely low both for immediate and delayed recall. Yes/no recognition discrimination showed 10 hits but also 5 false positive errors, which suggests a yes bias and is an extremely low score. Immediate recall for a short story was similar with extremely low immediate recall and then no information retained on delayed recall.  In the visual realm, delayed recall for a modestly complex visual stimulus was extremely low.   Executive Functions: Performance on executive measures was mixed with an overall impression of some issues although surprisingly, she did perform in the average range on the challenging Trail Making B test. Reasoning with verbal information was extremely low on the Complex Ideational Material. Generation of words was unusually low on phonemic fluency. Her clock drawing showed reasonable face formation and number placement but stimulus bound placement of the hands, which were also out of proportion, generating a borderline to  mildly impaired score.   Rating Scale(s): Ms. Englert screened positive for the presence of depression. She was characterized as functioning at a mild dementia level on the QDRS, an informant report measure of dementia severity. I was able to rate a CDR for her and her current severity is in the early moderate range, although her UTI may still be throwing her off to some extent, so mild presents as the best severity classification.   Viviano Simas [name] PsyD, [place] Clinical Neuropsychologist

## 2021-01-30 NOTE — Progress Notes (Signed)
     [facility]  Patient Name: Alejandra Davis MRN: [id number] Date of Birth: 03-Nov-1936 Age: 85 y.o. Education: 14 years  Measurement properties of test scores: IQ, Index, and Standard Scores (SS): Mean = 100; Standard Deviation = 15 Scaled Scores (Ss): Mean = 10; Standard Deviation = 3 Z scores (Z): Mean = 0; Standard Deviation = 1 T scores (T); Mean = 50; Standard Deviation = 10  TEST SCORES:    Note: This summary of test scores accompanies the interpretive report and should not be interpreted by unqualified individuals or in isolation without reference to the report. Test scores are relative to age, gender, and educational history as available and appropriate.   Expected Functioning        Wide Range Achievement Test (Word Reading): Standard/Scaled Score Percentile       Word Reading 103 58              RBANS, Form : Standard/Scaled Score Percentile  Total Score 67 1  Immediate Memory 49 <1      List Learning 2 <1      Story Memory 2 <1  Visuospatial/Constructional 102 55      Figure Copy   (19) 12 75      Judgment of Line Orientation   (15) --- 26-50  Language 80 9      Picture Naming --- 26-50      Semantic Fluency 2 <1  Attention 85 16      Digit Span 9 37      Coding 6 9  Delayed Memory 52 <1      List Recall   (0) --- <2      List Recognition   (15) --- <2      Story Recall   (0) 1 <1      Figure Recall   (1) 2 <1      Neuropsychological Assessment Battery (Language Module): T-score Percentile      Naming   (25) 37 9      Verbal Fluency: T-score Percentile      Controlled Oral Word Association (F-A-S) 30 2      Semantic Fluency (Animals) 27 1      Trail Making Test: T-Score Percentile      Part A 45 31      Part B 47 38      [place] Diagnostic Aphasia Exam: Raw Score Scaled Score      Complex Ideational Material 7 2      Clock Drawing Raw Score Descriptor      Command 8 Borderline Impairment      Rating Scales         Clinical Dementia Rating Raw Score Descriptor      Sum of Boxes 10.0 Moderate Dementia      Global Score 2.0 Moderate Dementia      Quick Dementia Rating System Raw Score Descriptor      Sum of Boxes 8 Mild Dementia      Total Score 10 Mild Dementia  Geriatric Depression Scale - Short Form 6 Positive   Lakishia Bourassa V. [name] PsyD, [place] Clinical Neuropsychologist

## 2021-02-04 ENCOUNTER — Encounter: Payer: Self-pay | Admitting: Counselor

## 2021-02-04 ENCOUNTER — Other Ambulatory Visit: Payer: Self-pay

## 2021-02-04 ENCOUNTER — Ambulatory Visit (INDEPENDENT_AMBULATORY_CARE_PROVIDER_SITE_OTHER): Payer: Medicare PPO | Admitting: Counselor

## 2021-02-04 DIAGNOSIS — G301 Alzheimer's disease with late onset: Secondary | ICD-10-CM | POA: Diagnosis not present

## 2021-02-04 DIAGNOSIS — F0281 Dementia in other diseases classified elsewhere with behavioral disturbance: Secondary | ICD-10-CM

## 2021-02-04 NOTE — Patient Instructions (Signed)
Your performance and presentation on assessment were consistent with significant cognitive impairments, with the most notable issues being in memory and on language measures. You also had scattered low scores in other areas. Visual spatial functioning (e.g. processing visual information) and attention presented as areas of relative strength for you. Given your clinical history and the fact that you now require help in several areas of daily-living, I think that you have reached a dementia level of function. The level of your condition is likely mild to moderate, and you may still have some interference from your UTI, so mild presents as the best severity level.   Dementia refers to a group of syndromes where multiple areas of ability are damaged in the brain, such as memory, thinking, judgment, and behavior, and most commonly refers to age related causes of dementia that cause worsening in these abilities over time. Alzheimer's disease is the most common form of dementia in people over the age of 5. Not all dementias are Alzheimer's disease, but all Alzheimer's disease is dementia. When dementia is due to an underlying condition affecting the brain, such as Alzheimer's disease, there is progression over time, which typically proceeds gradually over many years.   In your case, I think that your dementia is likely due to the most common age-related cause of cognitive decline, namely Alzheimer's disease. You do have some wear and tear on small blood vessels in the brain also and that may be contributing, but I do not think that is driving your problems.   Dementia is (somewhat arbitrarily) divided up into three different stages: Mild, Moderate, and Severe. These stages are not so much discrete stages as they are points on a continuum of severity. These stages govern the types of behavior you can expect, the level of care someone needs, and necessary supports. In the mild stages, difficulties are typically  limited to cognitive problems, such as forgetfulness or difficulties coming up with the right word. Individuals are often able to remain at home, safely operate a motor vehicle, and changes may not be obvious to the casual observer. They may have difficulties with more complex things such as managing finances, with driving, and with work if they are still working. These individuals are often capable of living independently with minimal assistance, but do require some supervision with more complex tasks such as managing finances, arranging and remembering to take medications, and reporting symptoms at doctors appointments. In the moderate stage, individuals with dementia eventually begin to have increasing difficulties with more basic activities (e.g., light housework, assembling a balanced meal, caring for personal hygiene and appearance). Individuals in the moderate stages also sometimes develop delusions (I.e., false beliefs), such as thinking that they have been visited by family members who are deceased, that their house is not their home, and the like. Individuals at this level of progression are not safe to operate a motor vehicle, should not be significantly involved in financial or medical decisions other than expressing their preferences, and should not be left alone for extended periods of time. They should not be left alone at all if there are problematic behaviors such as wandering. Individuals in the severe stage require extensive care and assistance, such as around the clock supervision or a skilled nursing level facility.   In your case, I think you are in the late mild stage. This is the point at which it typically becomes unsafe to drive and I am concerned about your driving and suggest that you stop at this  time. I am also concerned about your involvement in financial self-management and would suggest that your daughter exercise substantial control over bill payment and the like. It sounds like  this is becoming a source of anger and agitation for you. If you are reluctant to accept help, then it may be appropriate for your daughter to consult with an elder law attorney.   The mainstay of dementia care is supportive in nature, which means providing help with things that have become difficult for you to do, such as finances and the like. Your family are already doing a great job. One thing that also becomes challenging is figuring out what to do during the day. We are all confronted by dozens if not hundreds of choices every day and those become more challenging for someone with dementia, which can lead to agitation. I would therefore suggest that you develop a routine, increase the level of in home support, or if you are still having a hard time making it even with a high level of in home support that alternative living arrangements be considered (such as assisted living or even skilled nursing).   There are many things that you can do that are likely to contribute to better functioning, better behavior, and more positive interactions with your loved one. Among these is establishing a routine that supports meaningful behaviors. This means planning things out, intentionally, which can relieve the burden of decision making from the demented individual, lend a sense of comfort and predictability to the day, and also make sure basic needs such as eating, hygiene, and engaging hobbies are met by incorporating these things into the routine. Establishing a routine can be challenging but after an initial period, it is often one of the most helpful interventions. Plan your routine formally, with times and specific activities that should be completed (e.g., 8am "eat breakfast", 9:30am "morning walk", etc.). You can also plan in flex time or leisure time to avoid from making the routine overly rigid or restrictive.   Instead of medications, I recommend behavioral strategies for dealing with the behavioral and  psychological issues that can accompany dementia. Things like agitation, wandering, and anxiety can often be improved or eliminated using the "three R's." Redirection (help distract your loved one by focusing their attention on something else, moving them to a new environment, or otherwise engaging them in something other than what is distressing to them), Reassurance (reassure them that you are there to take care of them and that there is nothing they need to be worried about), and Reconsidering (consider the situation from their perspective and try to identify if there is something about the situation or environment that may be triggering their reaction).  You tried donepezil and did not have a good reaction, although there are other medications that could be considered. You could talk with Marlowe Sax, NP about this.   You may also talk with Webb Silversmith about an antidepressant, which can sometimes be helpful in agitation and you did screen in the positive range for depressive symptoms.   If you wish, you may return for re-evaluation in one to two years time to follow your progress and see how things are changing over time.

## 2021-02-04 NOTE — Progress Notes (Signed)
NEUROPSYCHOLOGY FEEDBACK NOTE [facility]  Feedback Note: I met with [name] to review the findings resulting from her neuropsychological evaluation. Since the last appointment, she has been about the same. Time was spent reviewing the impressions and recommendations that are detailed in the evaluation report. We discussed impression of late mild stage dementia, likely due to Alzheimer's disease with possibility of a more minor cerebrovascular contributor. I spent a significant portion of time explaining the level of care and support that is required for an individual at her level of functioning and counseling them regarding behavioral management of dementia. They presented as extremely appreciative of this information. Topics of discussion as reflected in the patient instructions. I took time to explain the findings and answer all the patient's questions. I encouraged Ms. Monts to contact me should she have any further questions or if further follow up is desired.   Current Medications and Medical History   Current Outpatient Medications  Medication Sig Dispense Refill  . amoxicillin-clavulanate (AUGMENTIN) 875-125 MG tablet Take 1 tablet by mouth 2 (two) times daily. Take along with Florastor 250 mg. 14 tablet 0  . saccharomyces boulardii (FLORASTOR) 250 MG capsule Take 1 capsule (250 mg total) by mouth 2 (two) times daily. Take along with Augmentin 875-125 mg. 20 capsule 0  . amLODipine (NORVASC) 10 MG tablet TAKE 1 TABLET(10 MG) BY MOUTH DAILY 90 tablet 1  . aspirin EC 81 MG tablet Take 81 mg by mouth daily.    . dorzolamide-timolol (COSOPT) 22.3-6.8 MG/ML ophthalmic solution 1 drop 2 (two) times daily.    . Lancets Ultra Thin 30G MISC 100 each by Does not apply route 3 (three) times a week. 100 each 0  . levothyroxine (SYNTHROID) 75 MCG tablet Take 1 tablet (75 mcg total) by mouth daily. 90 tablet 1  . lisinopril (ZESTRIL) 2.5 MG tablet Take 1 tablet (2.5 mg total) by mouth  daily. 90 tablet 1  . metFORMIN (GLUCOPHAGE) 500 MG tablet Take 1 tablet (500 mg total) by mouth daily. 90 tablet 1  . metoprolol tartrate (LOPRESSOR) 50 MG tablet Take 1 tablet (50 mg total) by mouth daily. 90 tablet 1   No current facility-administered medications for this visit.    Patient Active Problem List   Diagnosis Date Noted  . Stage 3b chronic kidney disease ([place]) 09/17/2020  . Hyperlipidemia associated with type 2 diabetes mellitus ([place]) 09/17/2020  . Flexural atopic dermatitis 05/30/2019  . Presbycusis of both ears 05/11/2019  . Acquired hypothyroidism 03/13/2019  . Murmur 09/01/2018  . Telogen effluvium 06/07/2018  . Eczema of scalp 03/31/2018  . Xerosis cutis 02/25/2018  . Immunization reaction 02/08/2018  . Keratosis pilaris 02/08/2018  . Health care maintenance 02/07/2018  . Controlled type 2 diabetes mellitus without complication, without long-term current use of insulin ([place]) 02/07/2018  . Cystocele   . Osteopenia   . Thyroid disease   . Hypertension     Mental Status and Behavioral Observations  DELLA SCRIVENER presented on time to the present encounter and was alert and generally oriented. Speech was normal in rate, rhythm, volume, and prosody. Self-reported mood was "good" and affect was neutral. Thought process was difficult to assess given limited spontaneous speech output and thought content was appropriate to the encounter context. There were no safety concerns identified at today's encounter, such as thoughts of harming self or others.   Plan  Feedback provided regarding the patient's neuropsychological evaluation. She has the support of her family, although is  getting to the point where they may need to consider alternative living arrangements, because she gets agitated without near [place]. We reviewed behavioral management strategies for dementia, they will follow up with Marlowe Sax, NP regarding medications. CRISLYN WILLBANKS was encouraged to  contact me if any questions arise or if further follow up is desired.   Viviano Simas [name], PsyD, ABN Clinical Neuropsychologist  Service(s) Provided at This Encounter: 48 minutes [phone]; Conjoint therapy with patient present)

## 2021-02-06 ENCOUNTER — Encounter: Payer: Medicare PPO | Admitting: Family

## 2021-02-06 ENCOUNTER — Other Ambulatory Visit: Payer: Self-pay

## 2021-02-06 NOTE — Progress Notes (Signed)
Reschedule Appt.

## 2021-02-07 ENCOUNTER — Other Ambulatory Visit: Payer: Self-pay

## 2021-02-07 ENCOUNTER — Telehealth: Payer: Self-pay

## 2021-02-07 ENCOUNTER — Encounter: Payer: Self-pay | Admitting: Family

## 2021-02-07 ENCOUNTER — Ambulatory Visit (INDEPENDENT_AMBULATORY_CARE_PROVIDER_SITE_OTHER): Payer: Medicare PPO | Admitting: Family

## 2021-02-07 DIAGNOSIS — Z23 Encounter for immunization: Secondary | ICD-10-CM

## 2021-02-07 DIAGNOSIS — Z0000 Encounter for general adult medical examination without abnormal findings: Secondary | ICD-10-CM

## 2021-02-07 MED ORDER — TETANUS-DIPHTH-ACELL PERTUSSIS 5-2-15.5 LF-MCG/0.5 IM SUSP: 0.5000 mL | Freq: Once | INTRAMUSCULAR | 0 refills | Status: AC

## 2021-02-07 NOTE — Patient Instructions (Signed)
Alejandra Davis , Thank you for taking time to come for your Medicare Wellness Visit. I appreciate your ongoing commitment to your health goals. Please review the following plan we discussed and let me know if I can assist you in the future.   Screening recommendations/referrals: Colonoscopy N/A  Mammogram:Upto date  Bone Density: up to date  Recommended yearly ophthalmology/optometry visit for glaucoma screening and checkup Recommended yearly dental visit for hygiene and checkup  Vaccinations: Influenza vaccine : up to date  Pneumococcal vaccine : up to date  Tdap vaccine : Please get Tdap vaccine  Shingles vaccine: up to date      Advanced directives: yes   Conditions/risks identified: Advance age female > 61 yrs,Type 2 DM,Hypertension   Next appointment: 1 year    Preventive Care 31 Years and Older, Female Preventive care refers to lifestyle choices and visits with your health care provider that can promote health and wellness. What does preventive care include?  A yearly physical exam. This is also called an annual well check.  Dental exams once or twice a year.  Routine eye exams. Ask your health care provider how often you should have your eyes checked.  Personal lifestyle choices, including:  Daily care of your teeth and gums.  Regular physical activity.  Eating a healthy diet.  Avoiding tobacco and drug use.  Limiting alcohol use.  Practicing safe sex.  Taking low-dose aspirin every day.  Taking vitamin and mineral supplements as recommended by your health care provider. What happens during an annual well check? The services and screenings done by your health care provider during your annual well check will depend on your age, overall health, lifestyle risk factors, and family history of disease. Counseling  Your health care provider may ask you questions about your:  Alcohol use.  Tobacco use.  Drug use.  Emotional well-being.  Home and relationship  well-being.  Sexual activity.  Eating habits.  History of falls.  Memory and ability to understand (cognition).  Work and work Statistician.  Reproductive health. Screening  You may have the following tests or measurements:  Height, weight, and BMI.  Blood pressure.  Lipid and cholesterol levels. These may be checked every 5 years, or more frequently if you are over 52 years old.  Skin check.  Lung cancer screening. You may have this screening every year starting at age 64 if you have a 30-pack-year history of smoking and currently smoke or have quit within the past 15 years.  Fecal occult blood test (FOBT) of the stool. You may have this test every year starting at age 91.  Flexible sigmoidoscopy or colonoscopy. You may have a sigmoidoscopy every 5 years or a colonoscopy every 10 years starting at age 86.  Hepatitis C blood test.  Hepatitis B blood test.  Sexually transmitted disease (STD) testing.  Diabetes screening. This is done by checking your blood sugar (glucose) after you have not eaten for a while (fasting). You may have this done every 1-3 years.  Bone density scan. This is done to screen for osteoporosis. You may have this done starting at age 104.  Mammogram. This may be done every 1-2 years. Talk to your health care provider about how often you should have regular mammograms. Talk with your health care provider about your test results, treatment options, and if necessary, the need for more tests. Vaccines  Your health care provider may recommend certain vaccines, such as:  Influenza vaccine. This is recommended every year.  Tetanus,  diphtheria, and acellular pertussis (Tdap, Td) vaccine. You may need a Td booster every 10 years.  Zoster vaccine. You may need this after age 67.  Pneumococcal 13-valent conjugate (PCV13) vaccine. One dose is recommended after age 83.  Pneumococcal polysaccharide (PPSV23) vaccine. One dose is recommended after age  77. Talk to your health care provider about which screenings and vaccines you need and how often you need them. This information is not intended to replace advice given to you by your health care provider. Make sure you discuss any questions you have with your health care provider. Document Released: 01/10/2016 Document Revised: 09/02/2016 Document Reviewed: 10/15/2015 Elsevier Interactive Patient Education  2017 [employer] Prevention in the Home Falls can cause injuries. They can happen to people of all ages. There are many things you can do to make your home safe and to help prevent falls. What can I do on the outside of my home?  Regularly fix the edges of walkways and driveways and fix any cracks.  Remove anything that might make you trip as you walk through a door, such as a raised step or threshold.  Trim any bushes or trees on the path to your home.  Use bright outdoor lighting.  Clear any walking paths of anything that might make someone trip, such as rocks or tools.  Regularly check to see if handrails are loose or broken. Make sure that both sides of any steps have handrails.  Any raised decks and porches should have guardrails on the edges.  Have any leaves, snow, or ice cleared regularly.  Use sand or salt on walking paths during winter.  Clean up any spills in your garage right away. This includes oil or grease spills. What can I do in the bathroom?  Use night lights.  Install grab bars by the toilet and in the tub and shower. Do not use towel bars as grab bars.  Use non-skid mats or decals in the tub or shower.  If you need to sit down in the shower, use a plastic, non-slip stool.  Keep the floor dry. Clean up any water that spills on the floor as soon as it happens.  Remove soap buildup in the tub or shower regularly.  Attach bath mats securely with double-sided non-slip rug tape.  Do not have throw rugs and other things on the floor that can make  you trip. What can I do in the bedroom?  Use night lights.  Make sure that you have a light by your bed that is easy to reach.  Do not use any sheets or blankets that are too big for your bed. They should not hang down onto the floor.  Have a firm chair that has side arms. You can use this for support while you get dressed.  Do not have throw rugs and other things on the floor that can make you trip. What can I do in the kitchen?  Clean up any spills right away.  Avoid walking on wet floors.  Keep items that you use a lot in easy-to-reach places.  If you need to reach something above you, use a strong step stool that has a grab bar.  Keep electrical cords out of the way.  Do not use floor polish or wax that makes floors slippery. If you must use wax, use non-skid floor wax.  Do not have throw rugs and other things on the floor that can make you trip. What can I do with  my stairs?  Do not leave any items on the stairs.  Make sure that there are handrails on both sides of the stairs and use them. Fix handrails that are broken or loose. Make sure that handrails are as long as the stairways.  Check any carpeting to make sure that it is firmly attached to the stairs. Fix any carpet that is loose or worn.  Avoid having throw rugs at the top or bottom of the stairs. If you do have throw rugs, attach them to the floor with carpet tape.  Make sure that you have a light switch at the top of the stairs and the bottom of the stairs. If you do not have them, ask someone to add them for you. What else can I do to help prevent falls?  Wear shoes that:  Do not have high heels.  Have rubber bottoms.  Are comfortable and fit you well.  Are closed at the toe. Do not wear sandals.  If you use a stepladder:  Make sure that it is fully opened. Do not climb a closed stepladder.  Make sure that both sides of the stepladder are locked into place.  Ask someone to hold it for you, if  possible.  Clearly mark and make sure that you can see:  Any grab bars or handrails.  First and last steps.  Where the edge of each step is.  Use tools that help you move around (mobility aids) if they are needed. These include:  Canes.  Walkers.  Scooters.  Crutches.  Turn on the lights when you go into a dark area. Replace any light bulbs as soon as they burn out.  Set up your furniture so you have a clear path. Avoid moving your furniture around.  If any of your floors are uneven, fix them.  If there are any pets around you, be aware of where they are.  Review your medicines with your doctor. Some medicines can make you feel dizzy. This can increase your chance of falling. Ask your doctor what other things that you can do to help prevent falls. This information is not intended to replace advice given to you by your health care provider. Make sure you discuss any questions you have with your health care provider. Document Released: 10/10/2009 Document Revised: 05/21/2016 Document Reviewed: 01/18/2015 Elsevier Interactive Patient Education  2017 [employer].

## 2021-02-07 NOTE — Telephone Encounter (Signed)
Ms. bonne, whack are scheduled for a virtual visit with your provider today.    Just as we do with appointments in the office, we must obtain your consent to participate.  Your consent will be active for this visit and any virtual visit you may have with one of our providers in the next 365 days.    If you have a MyChart account, I can also send a copy of this consent to you electronically.  All virtual visits are billed to your insurance company just like a traditional visit in the office.  As this is a virtual visit, video technology does not allow for your provider to perform a traditional examination.  This may limit your provider's ability to fully assess your condition.  If your provider identifies any concerns that need to be evaluated in person or the need to arrange testing such as labs, EKG, etc, we will make arrangements to do so.    Although advances in technology are sophisticated, we cannot ensure that it will always work on either your end or our end.  If the connection with a video visit is poor, we may have to switch to a telephone visit.  With either a video or telephone visit, we are not always able to ensure that we have a secure connection.   I need to obtain your verbal consent now.   Are you willing to proceed with your visit today?   Alejandra Davis has provided verbal consent on 02/07/2021 for a virtual visit (video or telephone).   [name], [place] 02/07/2021  11:00 AM

## 2021-02-07 NOTE — Progress Notes (Signed)
This service is provided via telemedicine  No vital signs collected/recorded due to the encounter was a telemedicine visit.   Location of patient (ex: home, work): Home.  Patient consents to a telephone visit: Yes.  Location of the provider (ex: office, home): [facility].  Name of any referring provider:Jaquaya Coyle C, NP   Names of all persons participating in the telemedicine service and their role in the encounter: Patient, [name] Daughter , Heriberto Antigua, [place], Claudia Greenley, [place], NP.    Time spent on call:  8 minutes spent on the phone with Medical Assistant.       Subjective:   Alejandra Davis is a 85 y.o. female who presents for Medicare Annual (Subsequent) preventive examination.  Review of Systems     Cardiac Risk Factors include: advanced age (>17men, >26 women);diabetes mellitus;hypertension     Objective:    There were no vitals filed for this visit. There is no height or weight on file to calculate BMI.  Advanced Directives 02/07/2021 01/01/2021 09/17/2020 09/06/2020 06/21/2020 03/13/2020 11/16/2019  Does Patient Have a Medical Advance Directive? Yes Yes Yes Yes Yes Yes Yes  Type of Paramedic of [place];Living will [place];Living will Living will;Healthcare Power of [place];Living will Living will;Healthcare Power of [place];Living will [place]  Does patient want to make changes to medical advance directive? No - Patient declined No - Patient declined No - Patient declined No - Patient declined No - Patient declined No - Patient declined No - Patient declined  Copy of [place] in Chart? Yes - validated most recent copy scanned in chart (See row information) Yes - validated most recent copy scanned in chart (See row information) Yes - validated most recent copy scanned in chart (See row information) Yes - validated  most recent copy scanned in chart (See row information) Yes - validated most recent copy scanned in chart (See row information) Yes - validated most recent copy scanned in chart (See row information) No - copy requested  Would patient like information on creating a medical advance directive? - - - - - - -    Current Medications (verified) Outpatient Encounter Medications as of 02/07/2021  Medication Sig   amLODipine (NORVASC) 10 MG tablet TAKE 1 TABLET(10 MG) BY MOUTH DAILY   dorzolamide-timolol (COSOPT) 22.3-6.8 MG/ML ophthalmic solution 1 drop 2 (two) times daily.   Lancets Ultra Thin 30G MISC 100 each by Does not apply route 3 (three) times a week.   levothyroxine (SYNTHROID) 75 MCG tablet Take 1 tablet (75 mcg total) by mouth daily.   lisinopril (ZESTRIL) 2.5 MG tablet Take 1 tablet (2.5 mg total) by mouth daily.   metoprolol tartrate (LOPRESSOR) 50 MG tablet Take 1 tablet (50 mg total) by mouth daily.   aspirin EC 81 MG tablet Take 81 mg by mouth daily.   metFORMIN (GLUCOPHAGE) 500 MG tablet Take 1 tablet (500 mg total) by mouth daily.   [DISCONTINUED] amoxicillin-clavulanate (AUGMENTIN) 875-125 MG tablet Take 1 tablet by mouth 2 (two) times daily. Take along with Florastor 250 mg.   [DISCONTINUED] saccharomyces boulardii (FLORASTOR) 250 MG capsule Take 1 capsule (250 mg total) by mouth 2 (two) times daily. Take along with Augmentin 875-125 mg.   No facility-administered encounter medications on file as of 02/07/2021.    Allergies (verified) Donepezil hcl   History: Past Medical History:  Diagnosis Date   Chicken pox  Cystocele    Diabetes mellitus without complication (HCC)    Glaucoma    Hypertension    Hypertensive retinopathy    OU   Neuromuscular disorder ([place])    Dementia    Thyroid disease    hypothyroidism   Past Surgical History:  Procedure Laterality Date   ABDOMINAL HYSTERECTOMY  1990   TAH BSO   CATARACT EXTRACTION Bilateral    Dr.  [name]   EYE SURGERY Bilateral    Cat Sx OU   OOPHORECTOMY     BSO   Vaginal Bx     Papilloma   Family History  Problem Relation Age of Onset   Sickle cell anemia Other    Hypertension Mother    Cancer Father        LIVER   Heart disease Brother    Cancer Brother        STOMACH   Social History   Socioeconomic History   Marital status: Divorced    Spouse name: Not on file   Number of children: Not on file   Years of education: Not on file   Highest education level: Not on file  Occupational History   Not on file  Tobacco Use   Smoking status: Never Smoker   Smokeless tobacco: Never Used  Vaping Use   Vaping Use: Never used  Substance and Sexual Activity   Alcohol use: No   Drug use: No   Sexual activity: Not on file  Other Topics Concern   Not on file  Social History Narrative   Social History      Diet? n/a      Do you drink/eat things with caffeine? Yes   Marital status?      Divorced                               What year were you married? 1962      Do you live in a house, apartment, assisted living, condo, trailer, etc.? house      Is it one or more stories? One story      How many persons live in your home? 1      Do you have any pets in your home? (please list) none       Highest level of education completed? 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               sometimes                       Type & how often? Walking       Advanced Directives      Do you have a living will? No       Do you have a DNR form?                                  If not, do you want to discuss one?  No       Do you have signed POA/HPOA for forms?       Functional Status      Do you have difficulty bathing or dressing yourself? No       Do you  have difficulty preparing food or eating? No       Do you have  difficulty managing your medications? No       Do you have difficulty managing your finances? No       Do you have difficulty affording your medications? No       Social Determinants of Radio broadcast assistant Strain: Not on file  Food Insecurity: Not on file  Transportation Needs: Not on file  Physical Activity: Not on file  Stress: Not on file  Social Connections: Not on file    Tobacco Counseling Counseling given: Not Answered   Clinical Intake:  Pre-visit preparation completed: No  Pain : No/denies pain     BMI - recorded: 21.26 Nutritional Status: BMI of 19-24  Normal  How often do you need to have someone help you when you read instructions, pamphlets, or other written materials from your doctor or pharmacy?: 1 - Never What is the last grade level you completed in school?: College  Diabetic? Yes   Interpreter Needed?: No  Information entered by :: Minsa Weddington,FNP-C   Activities of Daily Living In your present state of health, do you have any difficulty performing the following activities: 02/07/2021  Hearing? N  Vision? N  Difficulty concentrating or making decisions? Y  Comment rembering  Walking or climbing stairs? Y  Dressing or bathing? N  Doing errands, shopping? N  Preparing Food and eating ? N  Using the Toilet? N  In the past six months, have you accidently leaked urine? N  Do you have problems with loss of bowel control? N  Managing your Medications? N  Managing your Finances? N  Housekeeping or managing your Housekeeping? N  Some recent data might be hidden    Patient Care Team: Antara Brecheisen, Nelda Bucks, NP as PCP - General (Family Medicine)  Indicate any recent Medical Services you may have received from other than [facility] providers in the past year (date may be approximate).     Assessment:   This is a routine wellness examination for Marg[place].  Hearing/Vision screen  Hearing Screening   125Hz  250Hz  500Hz  1000Hz  2000Hz  3000Hz  4000Hz   6000Hz  8000Hz   Right ear:           Left ear:           Comments: Some Hearing Concerns.  Vision Screening Comments: No Vision Concerns. Patient has reading glasses.   Dietary issues and exercise activities discussed: Current Exercise Habits: Home exercise routine, Type of exercise: walking, Time (Minutes): 30, Frequency (Times/Week): 3, Weekly Exercise (Minutes/Week): 90, Intensity: Mild, Exercise limited by: None identified  Goals     Exercise 3x per week (30 min per time)     I would like to exercise by walking three times per week  x 30 minutes.       Depression Screen PHQ 2/9 Scores 02/07/2021 03/13/2020 11/10/2016  PHQ - 2 Score 0 0 0    Fall Risk Fall Risk  02/07/2021 01/01/2021 09/17/2020 09/06/2020 06/21/2020  Falls in the past year? 0 0 0 0 0  Number falls in past yr: 0 0 0 0 0  Injury with Fall? 0 0 0 0 0    FALL RISK PREVENTION PERTAINING TO THE HOME:  Any stairs in or around the home? Yes  If so, are there any without handrails? Yes  Home free of loose throw rugs in walkways, pet beds, electrical cords, etc? No  Adequate lighting in your home to reduce risk  of falls? Yes   ASSISTIVE DEVICES UTILIZED TO PREVENT FALLS:  Life alert? No  Use of a cane, walker or w/c? No  Grab bars in the bathroom? Yes  Shower chair or bench in shower? No  Elevated toilet seat or a handicapped toilet? No   TIMED UP AND GO:  Was the test performed? No .  Length of time to ambulate 10 feet: N/A sec.   Gait slow and steady without use of assistive device  Cognitive Function: MMSE - Mini Mental State Exam 01/29/2021 11/28/2019 05/09/2019  Orientation to time 0 2 3  Orientation to Place 3 4 4   Registration 3 3 3   Attention/ Calculation 2 5 2   Recall 0 0 0  Language- name 2 objects 2 2 2   Language- repeat 1 1 1   Language- follow 3 step command 3 3 3   Language- read & follow direction 1 1 1   Write a sentence 1 1 1   Copy design 0 1 0  Copy design-comments - named 6 animals -  Total  score 16 23 20      6CIT Screen 02/07/2021  What Year? 0 points  What month? 0 points  What time? 0 points  Count back from 20 0 points  Months in reverse 0 points  Repeat phrase 0 points  Total Score 0    Immunizations Immunization History  Administered Date(s) Administered   Fluad Quad(high Dose 65+) 09/17/2020   Influenza, High Dose Seasonal PF 12/20/2017, 09/25/2019   Influenza-Unspecified 12/20/2017   PFIZER(Purple Top)SARS-COV-2 Vaccination 01/05/2020, 02/05/2020   Pneumococcal Polysaccharide-23 03/13/2020   Zoster Recombinat (Shingrix) 02/07/2018, 05/18/2018    TDAP status: Due, Education has been provided regarding the importance of this vaccine. Advised may receive this vaccine at local pharmacy or Health Dept. Aware to provide a copy of the vaccination record if obtained from local pharmacy or Health Dept. Verbalized acceptance and understanding.  Flu Vaccine status: Up to date  Pneumococcal vaccine status: Up to date  Covid-19 vaccine status: Information provided on how to obtain vaccines.   Qualifies for Shingles Vaccine? Yes   Zostavax completed Yes   Shingrix Completed?: Yes  Screening Tests Health Maintenance  Topic Date Due   TETANUS/TDAP  Never done   COVID-19 Vaccine (3 - Booster for Pfizer series) 08/04/2020   OPHTHALMOLOGY EXAM  12/18/2020   FOOT EXAM  02/22/2021   HEMOGLOBIN A1C  03/10/2021   PNA vac Low Risk Adult (2 of 2 - PCV13) 03/13/2021   INFLUENZA VACCINE  Completed   DEXA SCAN  Completed    Health Maintenance  Health Maintenance Due  Topic Date Due   TETANUS/TDAP  Never done   COVID-19 Vaccine (3 - Booster for Pfizer series) 08/04/2020   OPHTHALMOLOGY EXAM  12/18/2020    Colorectal cancer screening: No longer required.   Mammogram status: No longer required due to age.  Bone Density status: Completed 07/11/2013 . Results reflect: Bone density results: NORMAL. Repeat every 2 years.  Lung Cancer Screening: (Low  Dose CT Chest recommended if Age 88-80 years, 30 pack-year currently smoking OR have quit w/in 15years.) does not qualify.   Lung Cancer Screening Referral: No   Additional Screening:  Hepatitis C Screening: does qualify; Completed yes  Vision Screening: Recommended annual ophthalmology exams for early detection of glaucoma and other disorders of the eye. Is the patient up to date with their annual eye exam?  Yes  Who is the provider or what is the name of the office in which the  patient attends annual eye exams? Dr.Christopher weaver  If pt is not established with a provider, would they like to be referred to a provider to establish care? No .   Dental Screening: Recommended annual dental exams for proper oral hygiene  Community Resource Referral / Chronic Care Management: CRR required this visit?  No   CCM required this visit?  No     Plan:   - Tdap vaccine  - COVID-19 booster  I have personally reviewed and noted the following in the patients chart:    Medical and social history  Use of alcohol, tobacco or illicit drugs   Current medications and supplements  Functional ability and status  Nutritional status  Physical activity  Advanced directives  List of other physicians  Hospitalizations, surgeries, and ER visits in previous 12 months  Vitals  Screenings to include cognitive, depression, and falls  Referrals and appointments  In addition, I have reviewed and discussed with patient certain preventive protocols, quality metrics, and best practice recommendations. A written personalized care plan for preventive services as well as general preventive health recommendations were provided to patient.    [name], NP   02/07/2021   Nurse Notes: Advised to get her COVID-19 and Tdap vaccine at her pharmacy

## 2021-02-07 NOTE — Telephone Encounter (Signed)
Vickii Penna at [facility] called regarding Tdap request that was sent in. It was sent in for Tdap for ages 10-64, but should be for booster. Spoke with Marlowe Sax, NP and approved for me to call pharmacy and have it changed to the booster. Called pharmacy and spoke with Vickii Penna and Tdap was changed to the booster dose.

## 2021-02-09 NOTE — Progress Notes (Signed)
This encounter was created in error - please disregard.

## 2021-02-13 NOTE — Progress Notes (Signed)
[facility] Note  02/18/2021     CHIEF COMPLAINT Patient presents for Retina Follow Up   HISTORY OF PRESENT ILLNESS: Alejandra Davis is a 85 y.o. female who presents to the clinic today for:   HPI    Retina Follow Up    Patient presents with  CRVO/BRVO.  In right eye.  This started 7 weeks ago.  I, the attending physician,  performed the HPI with the patient and updated documentation appropriately.          Comments    Patient here for 7 weeks retina follow up for BRVO OD. Patient states vision seems to be doing ok. No eye pain.        Last edited by [name], MD on 02/18/2021  4:22 PM. (History)       Referring physician: [name], NP [place],  [place] [id number]  HISTORICAL INFORMATION:   Selected notes from the MEDICAL RECORD NUMBER Referred by [name] for DM exam   CURRENT MEDICATIONS: Current Outpatient Medications (Ophthalmic Drugs)  Medication Sig  . dorzolamide-timolol (COSOPT) 22.3-6.8 MG/ML ophthalmic solution 1 drop 2 (two) times daily.   No current facility-administered medications for this visit. (Ophthalmic Drugs)   Current Outpatient Medications (Other)  Medication Sig  . amLODipine (NORVASC) 10 MG tablet TAKE 1 TABLET(10 MG) BY MOUTH DAILY  . aspirin EC 81 MG tablet Take 81 mg by mouth daily.  . Lancets Ultra Thin 30G MISC 100 each by Does not apply route 3 (three) times a week.  . levothyroxine (SYNTHROID) 75 MCG tablet Take 1 tablet (75 mcg total) by mouth daily.  Marland Kitchen lisinopril (ZESTRIL) 2.5 MG tablet Take 1 tablet (2.5 mg total) by mouth daily.  . metFORMIN (GLUCOPHAGE) 500 MG tablet Take 1 tablet (500 mg total) by mouth daily.  . metoprolol tartrate (LOPRESSOR) 50 MG tablet Take 1 tablet (50 mg total) by mouth daily.   No current facility-administered medications for this visit. (Other)      REVIEW OF SYSTEMS: ROS    Positive for: Skin, Genitourinary, Musculoskeletal, Endocrine,  Cardiovascular, Eyes   Negative for: Constitutional, Gastrointestinal, Neurological, HENT, Respiratory, Psychiatric, Allergic/Imm, Heme/Lymph   Last edited by [name], COA on 02/18/2021  1:24 PM. (History)       ALLERGIES Allergies  Allergen Reactions  . Donepezil Hcl     PAST MEDICAL HISTORY Past Medical History:  Diagnosis Date  . Chicken pox   . Cystocele   . Diabetes mellitus without complication ([place])   . Glaucoma   . Hypertension   . Hypertensive retinopathy    OU  . Neuromuscular disorder ([place])    Dementia   . Thyroid disease    hypothyroidism   Past Surgical History:  Procedure Laterality Date  . ABDOMINAL HYSTERECTOMY  1990   TAH BSO  . CATARACT EXTRACTION Bilateral    Dr. [name]  . EYE SURGERY Bilateral    Cat Sx OU  . OOPHORECTOMY     BSO  . Vaginal Bx     Papilloma    FAMILY HISTORY Family History  Problem Relation Age of Onset  . Sickle cell anemia Other   . Hypertension Mother   . Cancer Father        LIVER  . Heart disease Brother   . Cancer Brother        STOMACH    SOCIAL HISTORY Social History   Tobacco Use  .  Smoking status: Never Smoker  . Smokeless tobacco: Never Used  Vaping Use  . Vaping Use: Never used  Substance Use Topics  . Alcohol use: No  . Drug use: No         OPHTHALMIC EXAM:  Base Eye Exam    Visual Acuity (Snellen - Linear)      Right Left   Dist [place] 20/40 20/30   Dist ph [place] 20/30 -1 20/25 -2  Patient didn't bring glasses.       Tonometry (Tonopen, 1:21 PM)      Right Left   Pressure 12 11       Pupils      Dark Light Shape React APD   Right 3 2 Round Brisk None   Left 3 2 Round Brisk None       Visual Fields (Counting fingers)      Left Right    Full Full       Extraocular Movement      Right Left    Full, Ortho Full, Ortho       Neuro/Psych    Oriented x3: Yes   Mood/Affect: Normal       Dilation    Both eyes: 1.0% Mydriacyl, 2.5% Phenylephrine @ 1:21 PM         Slit Lamp and Fundus Exam    Slit Lamp Exam      Right Left   Lids/Lashes Dermatochalasis - upper lid, Meibomian gland dysfunction Dermatochalasis - upper lid, Meibomian gland dysfunction   Conjunctiva/Sclera Melanosis, no injection Melanosis   Cornea Arcus, 1+ Punctate epithelial erosions, mild Anterior basement membrane dystrophy, Well healed cataract wounds, tr Descemet's folds tempoally Arcus, Inferior 1+ Punctate epithelial erosions; 2-3+ microcystic edema temporal with 2+ descemet folds, Keratic precipitates.  Well healed temporal cat sx incision.   Anterior Chamber Deep, no cell or flare Deep;  no cell or flare   Iris Round and dilated, No NVI Round and dilated, No NVI   Lens PC IOL in good position, trace Posterior capsular opacification Posterior chamber intraocular lens in good position.  1+ PCO.   Vitreous Vitreous syneresis Vitreous syneresis       Fundus Exam      Right Left   Disc Sharp rim, inferior notch, 2-3+ pallor, inf. Rim thinning, attenuated vessels superiorly, +disc heme at 1100 2+ pallor, Sharp rim, pink   C/D Ratio 0.7 0.5   Macula Flat, good foveal reflex, mild IRH superior macula -- improving, stable improvement in Cystic changes Flat, good foveal reflex, mild RPE mottling and clumping, Epiretinal membrane, No heme or edema   Vessels attenuated, Tortuous Mild vascular attenuation, AV crossing changes   Periphery Attached, scattered IRH/DBH superior hemisphere - improving Attached, mild, scattered Reticular degeneration, No heme         Refraction    Wearing Rx      Sphere Cylinder Axis Add   Right -0.75 +1.25 170 +2.50   Left -0.75 +1.00 003 +2.50   Type: PAL          IMAGING AND PROCEDURES  Imaging and Procedures for 03/07/18  OCT, Retina - OU - Both Eyes       Right Eye Quality was good. Central Foveal Thickness: 260. Progression has been stable. Findings include no SRF, epiretinal membrane, normal foveal contour, no IRF (Trace cystic changes  temporal macula -- persistent).   Left Eye Quality was good. Central Foveal Thickness: 260. Progression has been stable. Findings include abnormal foveal contour, epiretinal  membrane, no IRF, no SRF, vitreomacular adhesion  (Blunted foveal depression--stable).   Notes *Images captured and stored on drive  Diagnosis / Impression:  ERM OU OD: BRVO w/ trace cystic changes temporal macula OS: very mild ERM with blunted foveal depression -- stable; mild VMA  Clinical management:  See below  Abbreviations: NFP - Normal foveal profile. CME - cystoid macular edema. PED - pigment epithelial detachment. IRF - intraretinal fluid. SRF - subretinal fluid. EZ - ellipsoid zone. ERM - epiretinal membrane. ORA - outer retinal atrophy. ORT - outer retinal tubulation. SRHM - subretinal hyper-reflective material         Intravitreal Injection, Pharmacologic Agent - OD - Right Eye       Time Out 02/18/2021. 1:39 PM. Confirmed correct patient, procedure, site, and patient consented.   Anesthesia Topical anesthesia was used. Anesthetic medications included Lidocaine 2%, Proparacaine 0.5%.   Procedure Preparation included 5% betadine to ocular surface, eyelid speculum. A supplied needle was used.   Injection:  1.25 mg Bevacizumab (AVASTIN) 1.25mg /0.41mL SOLN   NDC: H061816, Lot: [id number], Expiration date: 04/05/2021   Route: Intravitreal, Site: Right Eye, Waste: 0 mL  Post-op Post injection exam found visual acuity of at least counting fingers. The patient tolerated the procedure well. There were no complications. The patient received written and verbal post procedure care education. Post injection medications were not given.                 ASSESSMENT/PLAN:    ICD-10-CM   1. Branch retinal vein occlusion of right eye with macular edema  H34.8310 Intravitreal Injection, Pharmacologic Agent - OD - Right Eye    Bevacizumab (AVASTIN) SOLN 1.25 mg  2. Retinal edema  H35.81 OCT, Retina -  OU - Both Eyes  3. Diabetes mellitus type 2 without retinopathy ([place])  E11.9   4. Epiretinal membrane (ERM) of both eyes  H35.373   5. Pseudophakia of both eyes  Z96.1   6. Ocular hypertension of right eye  H40.051   7. Glaucoma suspect of both eyes  H40.003   8. Essential hypertension  I10   9. Hypertensive retinopathy of both eyes  H35.033     1,2. BRVO with CME OD  - at presentation, BCVA 20/60 OD -- down from 20/30  - initial OCT w/ CME superior macula  - s/p IVA OD #1 (10.18.19), #2 (11.19.19), #3 (12.17.19), #4 (02.19.20), #5 (03.23.20), #6 (04.21.20), #7 (05.27.20), #8 (07.07.20), #9 (08.25.20), #10 (9.29.20), #11 (11.17.20), #12 (12.22.20), #13 (01.27.21), #14 (03.10.21), #15 (04.21.21), #16 (05.26.21), #17 (06.30.21), #18 (08.04.21), #19 (09.01.21), #20 (10.06.21), #21 (11.17.21), #22 (12.30.21)  - good response to medications, but held IVA in January 2020 due to cataract surgery  - today, BCVA 20/30  - OCT shows trace cystic changes temporal macula at 7 wk interval   - recommend IVA OD #23 today, 02.22.22 -- maintenance  - pt wishes to proceed  - RBA of procedure discussed, questions answered  - informed consent obtained   - Avastin informed consent form signed and scanned on 01.27.2021  - see procedure note  - history of interval increase in IRF/cystic changes at 6+ weeks interval, but stable improvement in IRF/cystic changes at 7 wks today  - f/u 7 weeks -- DFE/OCT, possible injection (tx and ext as able)  3. Diabetes mellitus, type 2 without retinopathy  - The incidence, risk factors for progression, natural history and treatment options for diabetic retinopathy  were discussed with patient.     -  The need for close monitoring of blood glucose, blood pressure, and serum lipids, avoiding cigarette or any type of tobacco, and the need for long term follow up was also discussed with patient.   4. Epiretinal membrane, OU  - relatively mild ERM OU -- blunted central foveal  depression  - OD with mild cystic changes 2/2 BRVO as above; OS without cystic changes or edema  - discussed findings and prognosis   - monitor  5. Pseudophakia OU  - s/p CE/IOL OS 11.13.19 w/ Dr. [name]  - s/p CE/IOL OD 01.16.20 w/ Dr. [name]  - beautiful surgeries -- IOLs in perfect position  - healing well post-operatively  - IRF/CME OD may have been partly due to post op CME ([place]) in addition to BRVO  6,7. Ocular hypertension / Glaucoma suspect OD>OS  - IOP ~25 OD, 20 OS at prior clinic visits  - today IOP 12,11  - denies any family hx of glaucoma  - under the expert care of Dr. [name]  - uses Cosopt BID OU  8,9. Hypertensive retinopathy OU  - pt presented to 6.24.2020 visit urgently for right sided headache above right eye with extension to occiput  - BP at that time was 201/100 -- pt was sent to primary care clinic for urgent evaluation and meds were adjusted  - BP now under better control and headaches improved  - discussed importance of tight BP control  Ophthalmic Meds Ordered this visit:  Meds ordered this encounter  Medications  . Bevacizumab (AVASTIN) SOLN 1.25 mg      Return in about 7 weeks (around 04/08/2021) for f/u BRVO OD, DFE, OCT.  There are no Patient Instructions on file for this visit.  This document serves as a record of services personally performed by Gardiner Sleeper, MD, PhD. It was created on their behalf by Estill Bakes, COT an ophthalmic technician. The creation of this record is the provider's dictation and/or activities during the visit.    Electronically signed by: Estill Bakes, COT 2.17.22 @ 4:26 PM   This document serves as a record of services personally performed by Gardiner Sleeper, MD, PhD. It was created on their behalf by San Jetty. Owens Shark, OA an ophthalmic technician. The creation of this record is the provider's dictation and/or activities during the visit.    Electronically signed by: San Jetty. Owens Shark, [place] 02.22.2022 4:26  PM  Gardiner Sleeper, M.D., Ph.D. Diseases & Surgery of the Retina and [place] 02/18/2021   I have reviewed the above documentation for accuracy and completeness, and I agree with the above. Gardiner Sleeper, M.D., Ph.D. 02/18/21 4:26 PM  Abbreviations: M myopia (nearsighted); A astigmatism; H hyperopia (farsighted); P presbyopia; Mrx spectacle prescription;  CTL contact lenses; OD right eye; OS left eye; OU both eyes  XT exotropia; ET esotropia; PEK punctate epithelial keratitis; PEE punctate epithelial erosions; DES dry eye syndrome; MGD meibomian gland dysfunction; ATs artificial tears; PFAT's preservative free artificial tears; [place] nuclear sclerotic cataract; PSC posterior subcapsular cataract; ERM epi-retinal membrane; PVD posterior vitreous detachment; RD retinal detachment; DM diabetes mellitus; DR diabetic retinopathy; NPDR non-proliferative diabetic retinopathy; PDR proliferative diabetic retinopathy; CSME clinically significant macular edema; DME diabetic macular edema; dbh dot blot hemorrhages; CWS cotton wool spot; POAG primary open angle glaucoma; C/D cup-to-disc ratio; HVF humphrey visual field; GVF goldmann visual field; OCT optical coherence tomography; IOP intraocular pressure; BRVO Branch retinal vein occlusion; CRVO central retinal vein occlusion; CRAO central retinal artery  occlusion; BRAO branch retinal artery occlusion; RT retinal tear; SB scleral buckle; PPV pars plana vitrectomy; VH Vitreous hemorrhage; PRP panretinal laser photocoagulation; IVK intravitreal kenalog; VMT vitreomacular traction; MH Macular hole;  NVD neovascularization of the disc; NVE neovascularization elsewhere; AREDS age related eye disease study; ARMD age related macular degeneration; POAG primary open angle glaucoma; EBMD epithelial/anterior basement membrane dystrophy; ACIOL anterior chamber intraocular lens; IOL intraocular lens; PCIOL posterior chamber intraocular lens;  Phaco/IOL phacoemulsification with intraocular lens placement; [place] photorefractive keratectomy; LASIK laser assisted in situ keratomileusis; HTN hypertension; DM diabetes mellitus; COPD chronic obstructive pulmonary disease

## 2021-02-18 ENCOUNTER — Ambulatory Visit (INDEPENDENT_AMBULATORY_CARE_PROVIDER_SITE_OTHER): Payer: Medicare PPO | Admitting: Ophthalmology

## 2021-02-18 ENCOUNTER — Encounter (INDEPENDENT_AMBULATORY_CARE_PROVIDER_SITE_OTHER): Payer: Self-pay | Admitting: Ophthalmology

## 2021-02-18 ENCOUNTER — Other Ambulatory Visit: Payer: Self-pay

## 2021-02-18 DIAGNOSIS — H40051 Ocular hypertension, right eye: Secondary | ICD-10-CM

## 2021-02-18 DIAGNOSIS — Z961 Presence of intraocular lens: Secondary | ICD-10-CM | POA: Diagnosis not present

## 2021-02-18 DIAGNOSIS — I10 Essential (primary) hypertension: Secondary | ICD-10-CM | POA: Diagnosis not present

## 2021-02-18 DIAGNOSIS — H3581 Retinal edema: Secondary | ICD-10-CM

## 2021-02-18 DIAGNOSIS — H35373 Puckering of macula, bilateral: Secondary | ICD-10-CM

## 2021-02-18 DIAGNOSIS — H35033 Hypertensive retinopathy, bilateral: Secondary | ICD-10-CM

## 2021-02-18 DIAGNOSIS — H34831 Tributary (branch) retinal vein occlusion, right eye, with macular edema: Secondary | ICD-10-CM | POA: Diagnosis not present

## 2021-02-18 DIAGNOSIS — H40003 Preglaucoma, unspecified, bilateral: Secondary | ICD-10-CM

## 2021-02-18 DIAGNOSIS — E119 Type 2 diabetes mellitus without complications: Secondary | ICD-10-CM

## 2021-02-18 MED ORDER — BEVACIZUMAB CHEMO INJECTION 1.25MG/0.05ML SYRINGE FOR KALEIDOSCOPE
1.2500 mg | INTRAVITREAL | Status: AC | PRN
Start: 2021-02-18 — End: 2021-02-18
  Administered 2021-02-18: 1.25 mg via INTRAVITREAL

## 2021-03-04 ENCOUNTER — Encounter: Payer: Self-pay | Admitting: Family

## 2021-03-04 ENCOUNTER — Other Ambulatory Visit: Payer: Self-pay

## 2021-03-04 ENCOUNTER — Ambulatory Visit: Payer: Medicare PPO | Admitting: Family

## 2021-03-04 VITALS — BP 130/80 | HR 76 | Temp 97.1°F | Resp 16 | Ht 63.0 in

## 2021-03-04 DIAGNOSIS — J029 Acute pharyngitis, unspecified: Secondary | ICD-10-CM

## 2021-03-04 DIAGNOSIS — I10 Essential (primary) hypertension: Secondary | ICD-10-CM | POA: Diagnosis not present

## 2021-03-04 DIAGNOSIS — R059 Cough, unspecified: Secondary | ICD-10-CM | POA: Diagnosis not present

## 2021-03-04 DIAGNOSIS — J3489 Other specified disorders of nose and nasal sinuses: Secondary | ICD-10-CM

## 2021-03-04 MED ORDER — CLONIDINE HCL 0.1 MG PO TABS
0.1000 mg | ORAL_TABLET | Freq: Once | ORAL | Status: AC
  Administered 2021-03-04: 0.1 mg via ORAL

## 2021-03-04 MED ORDER — GUAIFENESIN-DM 100-10 MG/5ML PO SYRP: 5.0000 mL | ORAL_SOLUTION | ORAL | 0 refills | Status: DC | PRN

## 2021-03-04 MED ORDER — LORATADINE 10 MG PO TABS: 10.0000 mg | ORAL_TABLET | Freq: Every day | ORAL | 0 refills | Status: DC

## 2021-03-04 MED ORDER — AMOXICILLIN-POT CLAVULANATE 875-125 MG PO TABS: 1.0000 | ORAL_TABLET | Freq: Two times a day (BID) | ORAL | 0 refills | Status: DC

## 2021-03-04 NOTE — Patient Instructions (Addendum)
- please take antibiotics Augmentin 875-125 mg tablet one by mouth twice daily x 7 days  - Take Loratadine 10 mg tablet one by mouth daily for runny nose  - Robitussin 5 ml by mouth every 4 hrs as needed for cough  - Notify provider if symptoms worsen or fail to improve   Cough, Adult A cough helps to clear your throat and lungs. A cough may be a sign of an illness or another medical condition. An acute cough may only last 2-3 weeks, while a chronic cough may last 8 or more weeks. Many things can cause a cough. They include:  Germs (viruses or bacteria) that attack the airway.  Breathing in things that bother (irritate) your lungs.  Allergies.  Asthma.  Mucus that runs down the back of your throat (postnasal drip).  Smoking.  Acid backing up from the stomach into the tube that moves food from the mouth to the stomach (gastroesophageal reflux).  Some medicines.  Lung problems.  Other medical conditions, such as heart failure or a blood clot in the lung (pulmonary embolism). Follow these instructions at home: Medicines  Take over-the-counter and prescription medicines only as told by your doctor.  Talk with your doctor before you take medicines that stop a cough (cough suppressants). Lifestyle  Do not smoke, and try not to be around smoke. Do not use any products that contain nicotine or tobacco, such as cigarettes, e-cigarettes, and chewing tobacco. If you need help quitting, ask your doctor.  Drink enough fluid to keep your pee (urine) pale yellow.  Avoid caffeine.  Do not drink alcohol if your doctor tells you not to drink.   General instructions  Watch for any changes in your cough. Tell your doctor about them.  Always cover your mouth when you cough.  Stay away from things that make you cough, such as perfume, candles, campfire smoke, or cleaning products.  If the air is dry, use a cool mist vaporizer or humidifier in your home.  If your cough is worse at  night, try using extra pillows to raise your head up higher while you sleep.  Rest as needed.  Keep all follow-up visits as told by your doctor. This is important.   Contact a doctor if:  You have new symptoms.  You cough up pus.  Your cough does not get better after 2-3 weeks, or your cough gets worse.  Cough medicine does not help your cough and you are not sleeping well.  You have pain that gets worse or pain that is not helped with medicine.  You have a fever.  You are losing weight and you do not know why.  You have night sweats. Get help right away if:  You cough up blood.  You have trouble breathing.  Your heartbeat is very fast. These symptoms may be an emergency. Do not wait to see if the symptoms will go away. Get medical help right away. Call your local emergency services (911 in the [place] Do not drive yourself to the hospital. Summary  A cough helps to clear your throat and lungs. Many things can cause a cough.  Take over-the-counter and prescription medicines only as told by your doctor.  Always cover your mouth when you cough.  Contact a doctor if you have new symptoms or you have a cough that does not get better or gets worse. This information is not intended to replace advice given to you by your health care provider. Make sure you  discuss any questions you have with your health care provider. Document Revised: 02/02/2020 Document Reviewed: 01/02/2019 Elsevier Patient Education  [place].

## 2021-03-04 NOTE — Progress Notes (Signed)
Provider: Marlowe Sax FNP-C  Gailene Youkhana, Nelda Bucks, NP  Patient Care Team: Overton Boggus, Nelda Bucks, NP as PCP - General (Family Medicine)  Extended Emergency Contact Information Primary Emergency Contact: Blackham,[name] Address: [address]          [place], [place] [address] [place] Phone: [phone] Relation: Daughter Secondary Emergency Contact: Mehra,Mitchell Address: [place]          [place] [address] [place] of [place] Mobile Phone: [phone] Relation: Son  Code Status: Full code  Goals of care: Advanced Directive information Advanced Directives 03/04/2021  Does Patient Have a Medical Advance Directive? Yes  Type of Paramedic of [place];Living will  Does patient want to make changes to medical advance directive? No - Patient declined  Copy of [place] in Chart? Yes - validated most recent copy scanned in chart (See row information)  Would patient like information on creating a medical advance directive? -     Chief Complaint  Patient presents with  . Acute Visit    Cough, Stuffy Nose, Low grade fever;    HPI:  Pt is a 85 y.o. female seen today for an acute visit for evaluation of cough,stuffy nose and low grade fever.Has scratchy throat thinks from cough.Per care giver cough started this past weekend but patient states last night.took  cough syrup which helped.HPI limited due to memory issues.States coughed last night too.had small amounts of mucous.She denies any fever or chills.care giver states daughter reported low grade fever.she denies any headache,dizziness,nausea,vomiting,shortness of breath or wheezing.Has not been in contact with any sick person.Home COVID-19 test was negative.  Her B/p is elevated this visit.States took her thyroid medication but not blood pressure medication.States usually space it out. Takes B/p meds around 10 am.she denies any  headache,dizziness,fatigue,palpitation,chest pain or shortness of breath.    Past Medical History:  Diagnosis Date  . Chicken pox   . Cystocele   . Diabetes mellitus without complication ([place])   . Glaucoma   . Hypertension   . Hypertensive retinopathy    OU  . Neuromuscular disorder ([place])    Dementia   . Thyroid disease    hypothyroidism   Past Surgical History:  Procedure Laterality Date  . ABDOMINAL HYSTERECTOMY  1990   TAH BSO  . CATARACT EXTRACTION Bilateral    Dr. [name]  . EYE SURGERY Bilateral    Cat Sx OU  . OOPHORECTOMY     BSO  . Vaginal Bx     Papilloma    Allergies  Allergen Reactions  . Donepezil Hcl     Outpatient Encounter Medications as of 03/04/2021  Medication Sig  . amLODipine (NORVASC) 10 MG tablet TAKE 1 TABLET(10 MG) BY MOUTH DAILY  . amoxicillin-clavulanate (AUGMENTIN) 875-125 MG tablet Take 1 tablet by mouth 2 (two) times daily.  . dorzolamide-timolol (COSOPT) 22.3-6.8 MG/ML ophthalmic solution 1 drop 2 (two) times daily.  Marland Kitchen guaiFENesin-dextromethorphan (ROBITUSSIN DM) 100-10 MG/5ML syrup Take 5 mLs by mouth every 4 (four) hours as needed for cough.  . Lancets Ultra Thin 30G MISC 100 each by Does not apply route 3 (three) times a week.  . levothyroxine (SYNTHROID) 75 MCG tablet Take 1 tablet (75 mcg total) by mouth daily.  Marland Kitchen lisinopril (ZESTRIL) 2.5 MG tablet Take 1 tablet (2.5 mg total) by mouth daily.  Marland Kitchen loratadine (CLARITIN) 10 MG tablet Take 1 tablet (10 mg total) by mouth daily.  . metoprolol tartrate (LOPRESSOR) 50 MG tablet  Take 1 tablet (50 mg total) by mouth daily.  Marland Kitchen aspirin EC 81 MG tablet Take 81 mg by mouth daily.  . metFORMIN (GLUCOPHAGE) 500 MG tablet Take 1 tablet (500 mg total) by mouth daily.  . [EXPIRED] cloNIDine (CATAPRES) tablet 0.1 mg    No facility-administered encounter medications on file as of 03/04/2021.    Review of Systems  Constitutional: Negative for appetite change, chills, fatigue and fever.  HENT: Positive  for congestion, rhinorrhea and sore throat. Negative for ear pain, sinus pressure, sinus pain and sneezing.   Eyes: Negative for discharge, redness and itching.  Respiratory: Positive for cough. Negative for chest tightness, shortness of breath and wheezing.   Cardiovascular: Negative for chest pain, palpitations and leg swelling.  Gastrointestinal: Negative for abdominal distention, abdominal pain, constipation, diarrhea, nausea and vomiting.  Skin: Negative for color change, pallor and rash.  Neurological: Negative for dizziness, speech difficulty, weakness, light-headedness, numbness and headaches.  Psychiatric/Behavioral: Negative for agitation, behavioral problems, confusion and sleep disturbance. The patient is not nervous/anxious.     Immunization History  Administered Date(s) Administered  . Fluad Quad(high Dose 65+) 09/17/2020  . Influenza, High Dose Seasonal PF 12/20/2017, 09/25/2019  . Influenza-Unspecified 12/20/2017  . PFIZER(Purple Top)SARS-COV-2 Vaccination 01/05/2020, 02/05/2020  . Pneumococcal Polysaccharide-23 03/13/2020  . Zoster Recombinat (Shingrix) 02/07/2018, 05/18/2018   Pertinent  Health Maintenance Due  Topic Date Due  . OPHTHALMOLOGY EXAM  12/18/2020  . FOOT EXAM  02/22/2021  . HEMOGLOBIN A1C  03/10/2021  . PNA vac Low Risk Adult (2 of 2 - PCV13) 03/13/2021  . INFLUENZA VACCINE  Completed  . DEXA SCAN  Completed   Fall Risk  03/04/2021 02/07/2021 01/01/2021 09/17/2020 09/06/2020  Falls in the past year? 0 0 0 0 0  Number falls in past yr: 0 0 0 0 0  Injury with Fall? 0 0 0 0 0   Functional Status Survey:    Vitals:   03/04/21 1004 03/04/21 1040  BP: (!) 160/98 130/80  Pulse: 76   Resp: 16   Temp: (!) 97.1 F (36.2 C)   SpO2: 97%   Height: 5\' 3"  (1.6 m)    Body mass index is 21.26 kg/m. Physical Exam Vitals reviewed.  Constitutional:      General: She is not in acute distress.    Appearance: She is not ill-appearing.  HENT:     Head:  Normocephalic.     Right Ear: Tympanic membrane, ear canal and external ear normal. There is no impacted cerumen.     Left Ear: Tympanic membrane, ear canal and external ear normal. There is no impacted cerumen.     Nose: Rhinorrhea present. No congestion.     Mouth/Throat:     Mouth: Mucous membranes are moist.     Pharynx: Posterior oropharyngeal erythema present. No oropharyngeal exudate.  Eyes:     General:        Right eye: No discharge.        Left eye: No discharge.     Conjunctiva/sclera: Conjunctivae normal.     Pupils: Pupils are equal, round, and reactive to light.  Cardiovascular:     Rate and Rhythm: Normal rate and regular rhythm.     Pulses: Normal pulses.     Heart sounds: Normal heart sounds. No murmur heard. No friction rub. No gallop.   Pulmonary:     Effort: Pulmonary effort is normal. No respiratory distress.     Breath sounds: Normal breath sounds. No wheezing, rhonchi or  rales.  Chest:     Chest wall: No tenderness.  Abdominal:     General: Bowel sounds are normal. There is no distension.     Palpations: Abdomen is soft. There is no mass.     Tenderness: There is no abdominal tenderness. There is no left CVA tenderness, guarding or rebound.  Musculoskeletal:        General: No swelling or tenderness. Normal range of motion.     Right lower leg: No edema.     Left lower leg: No edema.  Skin:    General: Skin is warm and dry.     Coloration: Skin is not pale.     Findings: No bruising or erythema.  Neurological:     Mental Status: She is alert and oriented to person, place, and time.     Cranial Nerves: No cranial nerve deficit.     Sensory: No sensory deficit.     Motor: No weakness.     Coordination: Coordination normal.     Gait: Gait normal.  Psychiatric:        Mood and Affect: Mood normal.        Behavior: Behavior normal.        Thought Content: Thought content normal.        Judgment: Judgment normal.    Labs reviewed: Recent Labs     03/13/20 1405 06/21/20 1200 09/10/20 0802  NA 141 141 141  K 4.4 4.8 4.4  CL 105 104 107  CO2 24 26 26   GLUCOSE 99 114 132*  BUN 20 15 21   CREATININE 1.18* 1.18* 1.37*  CALCIUM 10.1 9.9 9.7   Recent Labs    03/13/20 1405 09/10/20 0802  AST 19 18  ALT 12 10  BILITOT 0.4 0.6  PROT 7.8 7.3   Recent Labs    03/13/20 1405 09/06/20 1630 09/10/20 0802  WBC 5.7 5.4 4.5  NEUTROABS 3,340 3,035 2,673  HGB 13.8 13.4 13.8  HCT 42.9 42.4 44.2  MCV 83.1 85.0 85.0  PLT 148 274 281   Lab Results  Component Value Date   TSH 1.49 01/15/2021   Lab Results  Component Value Date   HGBA1C 6.6 (H) 09/10/2020   Lab Results  Component Value Date   CHOL 224 (H) 09/10/2020   HDL 86 09/10/2020   LDLCALC 122 (H) 09/10/2020   TRIG 67 09/10/2020   CHOLHDL 2.6 09/10/2020    Significant Diagnostic Results in last 30 days:  Intravitreal Injection, Pharmacologic Agent - OD - Right Eye  Result Date: 02/18/2021 Time Out 02/18/2021. 1:39 PM. Confirmed correct patient, procedure, site, and patient consented. Anesthesia Topical anesthesia was used. Anesthetic medications included Lidocaine 2%, Proparacaine 0.5%. Procedure Preparation included 5% betadine to ocular surface, eyelid speculum. A supplied needle was used. Injection: 1.25 mg Bevacizumab (AVASTIN) 1.25mg /0.42mL SOLN   NDC: H061816, Lot: [id number], Expiration date: 04/05/2021   Route: Intravitreal, Site: Right Eye, Waste: 0 mL Post-op Post injection exam found visual acuity of at least counting fingers. The patient tolerated the procedure well. There were no complications. The patient received written and verbal post procedure care education. Post injection medications were not given.   OCT, Retina - OU - Both Eyes  Result Date: 02/18/2021 Right Eye Quality was good. Central Foveal Thickness: 260. Progression has been stable. Findings include no SRF, epiretinal membrane, normal foveal contour, no IRF (Trace cystic changes temporal macula --  persistent). Left Eye Quality was good. Central Foveal Thickness: 260. Progression has been stable.  Findings include abnormal foveal contour, epiretinal membrane, no IRF, no SRF, vitreomacular adhesion  (Blunted foveal depression--stable). Notes *Images captured and stored on drive Diagnosis / Impression: ERM OU OD: BRVO w/ trace cystic changes temporal macula OS: very mild ERM with blunted foveal depression -- stable; mild VMA Clinical management: See below Abbreviations: NFP - Normal foveal profile. CME - cystoid macular edema. PED - pigment epithelial detachment. IRF - intraretinal fluid. SRF - subretinal fluid. EZ - ellipsoid zone. ERM - epiretinal membrane. ORA - outer retinal atrophy. ORT - outer retinal tubulation. SRHM - subretinal hyper-reflective material   Assessment/Plan 1. Acute pharyngitis, unspecified etiology Afebrile.pharygeal erythremia noted.No tenderness to palpation or exudate. Start on Augmentin as below.side effects discussed limited due to memory issues.   - amoxicillin-clavulanate (AUGMENTIN) 875-125 MG tablet; Take 1 tablet by mouth 2 (two) times daily.  Dispense: 14 tablet; Refill: 0  2. Essential hypertension B/p elevated clonidine 0.1 mg tablet x 1 dose given blood pressure rechecked by CMA improved.did not take her B/p meds today.  - continue on current medication.  - cloNIDine (CATAPRES) tablet 0.1 mg  3. Cough Afebrile.bilateral lungs clear to Auscultation - robitussin as below as needed.  - guaiFENesin-dextromethorphan (ROBITUSSIN DM) 100-10 MG/5ML syrup; Take 5 mLs by mouth every 4 (four) hours as needed for cough.  Dispense: 118 mL; Refill: 0  4. Rhinorrhea Nasal drainage noted. - Advised to take loratadine 10 mg tablet daily until symptoms resolve. - loratadine (CLARITIN) 10 MG tablet; Take 1 tablet (10 mg total) by mouth daily.  Dispense: 30 tablet; Refill: 0  Family/ staff Communication: Reviewed plan of care with patient and care giver.  Labs/tests  ordered: None   Next Appointment: As needed if symptoms worsen or fail to improve.   [name], NP

## 2021-03-17 ENCOUNTER — Other Ambulatory Visit: Payer: Medicare PPO

## 2021-03-17 DIAGNOSIS — E1169 Type 2 diabetes mellitus with other specified complication: Secondary | ICD-10-CM

## 2021-03-17 DIAGNOSIS — I10 Essential (primary) hypertension: Secondary | ICD-10-CM

## 2021-03-17 DIAGNOSIS — E119 Type 2 diabetes mellitus without complications: Secondary | ICD-10-CM

## 2021-03-19 ENCOUNTER — Other Ambulatory Visit: Payer: Medicare PPO

## 2021-03-19 ENCOUNTER — Other Ambulatory Visit: Payer: Self-pay

## 2021-03-20 ENCOUNTER — Other Ambulatory Visit: Payer: Self-pay

## 2021-03-21 ENCOUNTER — Ambulatory Visit: Payer: Medicare PPO | Admitting: Family

## 2021-03-24 ENCOUNTER — Other Ambulatory Visit: Payer: Self-pay

## 2021-03-24 ENCOUNTER — Other Ambulatory Visit: Payer: Medicare PPO

## 2021-03-24 DIAGNOSIS — E1169 Type 2 diabetes mellitus with other specified complication: Secondary | ICD-10-CM | POA: Diagnosis not present

## 2021-03-24 DIAGNOSIS — E785 Hyperlipidemia, unspecified: Secondary | ICD-10-CM | POA: Diagnosis not present

## 2021-03-24 DIAGNOSIS — I10 Essential (primary) hypertension: Secondary | ICD-10-CM | POA: Diagnosis not present

## 2021-03-24 DIAGNOSIS — E119 Type 2 diabetes mellitus without complications: Secondary | ICD-10-CM | POA: Diagnosis not present

## 2021-03-25 ENCOUNTER — Ambulatory Visit (INDEPENDENT_AMBULATORY_CARE_PROVIDER_SITE_OTHER): Payer: Medicare PPO | Admitting: Family

## 2021-03-25 ENCOUNTER — Encounter: Payer: Self-pay | Admitting: Family

## 2021-03-25 VITALS — BP 148/89 | HR 76 | Temp 97.5°F | Resp 16 | Ht 63.0 in | Wt 119.6 lb

## 2021-03-25 DIAGNOSIS — N1832 Chronic kidney disease, stage 3b: Secondary | ICD-10-CM | POA: Diagnosis not present

## 2021-03-25 DIAGNOSIS — L602 Onychogryphosis: Secondary | ICD-10-CM

## 2021-03-25 DIAGNOSIS — E1169 Type 2 diabetes mellitus with other specified complication: Secondary | ICD-10-CM

## 2021-03-25 DIAGNOSIS — E785 Hyperlipidemia, unspecified: Secondary | ICD-10-CM

## 2021-03-25 DIAGNOSIS — E119 Type 2 diabetes mellitus without complications: Secondary | ICD-10-CM

## 2021-03-25 DIAGNOSIS — E039 Hypothyroidism, unspecified: Secondary | ICD-10-CM | POA: Diagnosis not present

## 2021-03-25 DIAGNOSIS — Z23 Encounter for immunization: Secondary | ICD-10-CM | POA: Diagnosis not present

## 2021-03-25 DIAGNOSIS — I10 Essential (primary) hypertension: Secondary | ICD-10-CM | POA: Diagnosis not present

## 2021-03-25 DIAGNOSIS — F0390 Unspecified dementia without behavioral disturbance: Secondary | ICD-10-CM

## 2021-03-25 LAB — CBC WITH DIFFERENTIAL/PLATELET
Absolute Monocytes: 489 cells/uL (ref 200–950)
Basophils Absolute: 38 cells/uL (ref 0–200)
Basophils Relative: 0.8 %
Eosinophils Absolute: 320 cells/uL (ref 15–500)
Eosinophils Relative: 6.8 %
HCT: 42 % (ref 35.0–45.0)
Hemoglobin: 13.2 g/dL (ref 11.7–15.5)
Lymphs Abs: 945 cells/uL (ref 850–3900)
MCH: 26.1 pg — ABNORMAL LOW (ref 27.0–33.0)
MCHC: 31.4 g/dL — ABNORMAL LOW (ref 32.0–36.0)
MCV: 83 fL (ref 80.0–100.0)
MPV: 10.6 fL (ref 7.5–12.5)
Monocytes Relative: 10.4 %
Neutro Abs: 2909 cells/uL (ref 1500–7800)
Neutrophils Relative %: 61.9 %
Platelets: 236 10*3/uL (ref 140–400)
RBC: 5.06 10*6/uL (ref 3.80–5.10)
RDW: 15 % (ref 11.0–15.0)
Total Lymphocyte: 20.1 %
WBC: 4.7 10*3/uL (ref 3.8–10.8)

## 2021-03-25 LAB — COMPLETE METABOLIC PANEL WITH GFR
AG Ratio: 1.2 (calc) (ref 1.0–2.5)
ALT: 20 U/L (ref 6–29)
AST: 19 U/L (ref 10–35)
Albumin: 3.7 g/dL (ref 3.6–5.1)
Alkaline phosphatase (APISO): 70 U/L (ref 37–153)
BUN/Creatinine Ratio: 19 (calc) (ref 6–22)
BUN: 21 mg/dL (ref 7–25)
CO2: 28 mmol/L (ref 20–32)
Calcium: 9.2 mg/dL (ref 8.6–10.4)
Chloride: 106 mmol/L (ref 98–110)
Creat: 1.12 mg/dL — ABNORMAL HIGH (ref 0.60–0.88)
GFR, Est African American: 53 mL/min/{1.73_m2} — ABNORMAL LOW (ref >=60)
GFR, Est Non African American: 46 mL/min/{1.73_m2} — ABNORMAL LOW (ref >=60)
Globulin: 3.1 g/dL (calc) (ref 1.9–3.7)
Glucose, Bld: 136 mg/dL — ABNORMAL HIGH (ref 65–99)
Potassium: 4.1 mmol/L (ref 3.5–5.3)
Sodium: 142 mmol/L (ref 135–146)
Total Bilirubin: 0.4 mg/dL (ref 0.2–1.2)
Total Protein: 6.8 g/dL (ref 6.1–8.1)

## 2021-03-25 LAB — HEMOGLOBIN A1C
Hgb A1c MFr Bld: 7.1 % of total Hgb — ABNORMAL HIGH (ref <5.7)
Mean Plasma Glucose: 157 mg/dL
eAG (mmol/L): 8.7 mmol/L

## 2021-03-25 LAB — LIPID PANEL
Cholesterol: 204 mg/dL — ABNORMAL HIGH (ref <200)
HDL: 68 mg/dL (ref >=50)
LDL Cholesterol (Calc): 119 mg/dL (calc) — ABNORMAL HIGH
Non-HDL Cholesterol (Calc): 136 mg/dL (calc) — ABNORMAL HIGH (ref <130)
Total CHOL/HDL Ratio: 3 (calc) (ref <5.0)
Triglycerides: 71 mg/dL (ref <150)

## 2021-03-25 MED ORDER — METFORMIN HCL 500 MG PO TABS: 500.0000 mg | ORAL_TABLET | Freq: Two times a day (BID) | ORAL | 1 refills | Status: DC

## 2021-03-25 MED ORDER — ASPIRIN EC 81 MG PO TBEC: 81.0000 mg | DELAYED_RELEASE_TABLET | Freq: Every day | ORAL | 3 refills | Status: DC

## 2021-03-25 MED ORDER — TETANUS-DIPHTH-ACELL PERTUSSIS 5-2.5-18.5 LF-MCG/0.5 IM SUSP: 0.5000 mL | Freq: Once | INTRAMUSCULAR | 0 refills | Status: AC

## 2021-03-25 NOTE — Progress Notes (Signed)
Provider: Marlowe Sax FNP-C   Ranita Stjulien, Nelda Bucks, NP  Patient Care Team: Kaydon Husby, Nelda Bucks, NP as PCP - General (Family Medicine)  Extended Emergency Contact Information Primary Emergency Contact: Angelini,[name] Address: [address]          [place], [place] [address] [place] Phone: [phone] Relation: Daughter Secondary Emergency Contact: Plamondon,Mitchell Address: [place]          [place] [address] [place] Mobile Phone: [phone] Relation: Son  Code Status: Full Code  Goals of care: Advanced Directive information Advanced Directives 03/25/2021  Does Patient Have a Medical Advance Directive? Yes  Type of Paramedic of [place];Living will  Does patient want to make changes to medical advance directive? No - Patient declined  Copy of [place] in Chart? Yes - validated most recent copy scanned in chart (See row information)  Would patient like information on creating a medical advance directive? -     Chief Complaint  Patient presents with  . Medical Management of Chronic Issues    6 month follow up & Discuss Lab Results.  . Health Maintenance    Discuss the need for Foot exam, and Eye exam.  . Immunizations    Discuss the need for PNA Vaccine, Covid Booster, and Tetanus Vaccine.    HPI:  Pt is a 85 y.o. female seen today 6 month for medical management of chronic diseases.States has a little headache today.  Hypertension - no home blood pressure readings for review.states has been taking her medication but not familiar with some of the medication on the list.she is here alone today.States a Friend brought her to visit.Though CMA checked was brought by assistant who was brought in to be present during visit due to patient's cognitive impairment does not recall all medication.repeating same question over and over after few minutes.   Type DM - takes metformin though had  told my assistant that she was not taking at first but after looking at medication list she confirmed taking medication.Marland KitchenHas had no signs of hypoglycemia.   Hyperlipidemia -  Eats bacon,eggs for breakfast.sometimes fried potatoes.for lunch or dinner eats sandwich.sometimes adds salads.goes to the [facility] with a friend to walk 2-3 times per week for around 30 minutes.    She is due for her Tdap vaccine ordered on previous visit but did not go to the pharmacy to get Tdap vaccine.Care giver advised to take to pharmacy for Tdap.    Past Medical History:  Diagnosis Date  . Chicken pox   . Cystocele   . Diabetes mellitus without complication ([place])   . Glaucoma   . Hypertension   . Hypertensive retinopathy    OU  . Neuromuscular disorder ([place])    Dementia   . Thyroid disease    hypothyroidism   Past Surgical History:  Procedure Laterality Date  . ABDOMINAL HYSTERECTOMY  1990   TAH BSO  . CATARACT EXTRACTION Bilateral    Dr. [name]  . EYE SURGERY Bilateral    Cat Sx OU  . OOPHORECTOMY     BSO  . Vaginal Bx     Papilloma    Allergies  Allergen Reactions  . Donepezil Hcl     Allergies as of 03/25/2021      Reactions   Donepezil Hcl       Medication List       Accurate as of March 25, 2021 12:08 PM. If you have  any questions, ask your nurse or doctor.        STOP taking these medications   amoxicillin-clavulanate 875-125 MG tablet Commonly known as: AUGMENTIN Stopped by: Nelda Bucks Dayn Barich, NP   guaiFENesin-dextromethorphan 100-10 MG/5ML syrup Commonly known as: ROBITUSSIN DM Stopped by: [name], NP     TAKE these medications   amLODipine 10 MG tablet Commonly known as: NORVASC TAKE 1 TABLET(10 MG) BY MOUTH DAILY   aspirin EC 81 MG tablet Take 1 tablet (81 mg total) by mouth daily.   dorzolamide-timolol 22.3-6.8 MG/ML ophthalmic solution Commonly known as: COSOPT 1 drop 2 (two) times daily.   Lancets Ultra Thin 30G Misc 100 each by Does not apply route  3 (three) times a week.   levothyroxine 75 MCG tablet Commonly known as: SYNTHROID Take 1 tablet (75 mcg total) by mouth daily.   lisinopril 2.5 MG tablet Commonly known as: Zestril Take 1 tablet (2.5 mg total) by mouth daily.   loratadine 10 MG tablet Commonly known as: CLARITIN Take 1 tablet (10 mg total) by mouth daily.   metFORMIN 500 MG tablet Commonly known as: GLUCOPHAGE Take 1 tablet (500 mg total) by mouth 2 (two) times daily with a meal. What changed: when to take this Changed by: [name], NP   metoprolol tartrate 50 MG tablet Commonly known as: LOPRESSOR Take 1 tablet (50 mg total) by mouth daily.   Tdap 5-2.5-18.5 LF-MCG/0.5 injection Commonly known as: BOOSTRIX Inject 0.5 mLs into the muscle once for 1 dose.       Review of Systems  Constitutional: Negative for appetite change, chills, fatigue, fever and unexpected weight change.  HENT: Negative for congestion, hearing loss, postnasal drip, rhinorrhea, sinus pressure, sinus pain, sneezing, sore throat and trouble swallowing.   Eyes: Negative for pain, discharge, redness, itching and visual disturbance.  Respiratory: Negative for cough, chest tightness, shortness of breath and wheezing.   Cardiovascular: Negative for chest pain, palpitations and leg swelling.  Gastrointestinal: Negative for abdominal distention, abdominal pain, constipation, diarrhea, nausea and vomiting.  Endocrine: Negative for cold intolerance, heat intolerance, polydipsia, polyphagia and polyuria.  Genitourinary: Negative for difficulty urinating, dysuria, flank pain, frequency and urgency.  Musculoskeletal: Negative for arthralgias, back pain, gait problem, joint swelling and myalgias.  Skin: Negative for color change, pallor and rash.  Neurological: Negative for dizziness, speech difficulty, weakness, light-headedness, numbness and headaches.  Hematological: Does not bruise/bleed easily.  Psychiatric/Behavioral: Negative for  agitation, behavioral problems and sleep disturbance. The patient is not nervous/anxious.        Forgetful     Immunization History  Administered Date(s) Administered  . Fluad Quad(high Dose 65+) 09/17/2020  . Influenza, High Dose Seasonal PF 12/20/2017, 09/25/2019  . Influenza-Unspecified 12/20/2017  . PFIZER(Purple Top)SARS-COV-2 Vaccination 01/05/2020, 02/05/2020  . Pneumococcal Polysaccharide-23 03/13/2020  . Zoster Recombinat (Shingrix) 02/07/2018, 05/18/2018   Pertinent  Health Maintenance Due  Topic Date Due  . OPHTHALMOLOGY EXAM  12/18/2020  . FOOT EXAM  02/22/2021  . PNA vac Low Risk Adult (2 of 2 - PCV13) 03/13/2021  . HEMOGLOBIN A1C  09/24/2021  . INFLUENZA VACCINE  Completed  . DEXA SCAN  Completed   Fall Risk  03/25/2021 03/04/2021 02/07/2021 01/01/2021 09/17/2020  Falls in the past year? 0 0 0 0 0  Number falls in past yr: 0 0 0 0 0  Injury with Fall? 0 0 0 0 0   Functional Status Survey:    Vitals:   03/25/21 1056  BP: Marland Kitchen)  148/89  Pulse: 76  Resp: 16  Temp: (!) 97.5 F (36.4 C)  SpO2: 97%  Weight: 119 lb 9.6 oz (54.3 kg)  Height: _0  (1.6 m)   Body mass index is 21.19 kg/m. Physical Exam Vitals reviewed.  Constitutional:      General: She is not in acute distress.    Appearance: She is normal weight. She is not ill-appearing.  HENT:     Head: Normocephalic.     Right Ear: Tympanic membrane, ear canal and external ear normal. There is no impacted cerumen.     Left Ear: Tympanic membrane, ear canal and external ear normal. There is no impacted cerumen.     Nose: Nose normal. No congestion or rhinorrhea.     Mouth/Throat:     Mouth: Mucous membranes are moist.     Pharynx: Oropharynx is clear. No oropharyngeal exudate or posterior oropharyngeal erythema.  Eyes:     General: No scleral icterus.       Right eye: No discharge.        Left eye: No discharge.     Extraocular Movements: Extraocular movements intact.     Conjunctiva/sclera: Conjunctivae  normal.     Pupils: Pupils are equal, round, and reactive to light.  Neck:     Vascular: No carotid bruit.  Cardiovascular:     Rate and Rhythm: Normal rate and regular rhythm.     Pulses: Normal pulses.     Heart sounds: Normal heart sounds. No murmur heard. No friction rub. No gallop.   Pulmonary:     Effort: Pulmonary effort is normal. No respiratory distress.     Breath sounds: Normal breath sounds. No wheezing, rhonchi or rales.  Chest:     Chest wall: No tenderness.  Abdominal:     General: Bowel sounds are normal. There is no distension.     Palpations: Abdomen is soft. There is no mass.     Tenderness: There is no abdominal tenderness. There is no right CVA tenderness, left CVA tenderness, guarding or rebound.  Musculoskeletal:        General: No swelling or tenderness. Normal range of motion.     Cervical back: Normal range of motion. No rigidity or tenderness.     Right lower leg: No edema.     Left lower leg: No edema.     Right foot: Normal range of motion and normal capillary refill. Bunion present. No swelling, tenderness, bony tenderness or crepitus. Normal pulse.     Left foot: Normal range of motion and normal capillary refill. Bunion present. No swelling, tenderness or crepitus. Normal pulse.  Feet:     Right foot:     Toenail Condition: Right toenails are long.     Left foot:     Toenail Condition: Left toenails are long.  Lymphadenopathy:     Cervical: No cervical adenopathy.  Skin:    General: Skin is warm and dry.     Coloration: Skin is not pale.     Findings: No bruising, erythema or rash.  Neurological:     Mental Status: She is alert. Mental status is at baseline.     Cranial Nerves: No cranial nerve deficit.     Sensory: No sensory deficit.     Motor: No weakness.     Coordination: Coordination normal.     Gait: Gait normal.  Psychiatric:        Mood and Affect: Mood normal.  Speech: Speech normal.        Behavior: Behavior normal.         Thought Content: Thought content normal.        Cognition and Memory: Memory is impaired.     Labs reviewed: Recent Labs    06/21/20 1200 09/10/20 0802 03/24/21 0816  NA 141 141 142  K 4.8 4.4 4.1  CL 104 107 106  CO2 _0 GLUCOSE 114 132* 136*  BUN _1 CREATININE 1.18* 1.37* 1.12*  CALCIUM 9.9 9.7 9.2   Recent Labs    09/10/20 0802 03/24/21 0816  AST 18 19  ALT 10 20  BILITOT 0.6 0.4  PROT 7.3 6.8   Recent Labs    09/06/20 1630 09/10/20 0802 03/24/21 0816  WBC 5.4 4.5 4.7  NEUTROABS 3,035 2,673 2,909  HGB 13.4 13.8 13.2  HCT 42.4 44.2 42.0  MCV 85.0 85.0 83.0  PLT 274 281 236   Lab Results  Component Value Date   TSH 1.49 01/15/2021   Lab Results  Component Value Date   HGBA1C 7.1 (H) 03/24/2021   Lab Results  Component Value Date   CHOL 204 (H) 03/24/2021   HDL 68 03/24/2021   LDLCALC 119 (H) 03/24/2021   TRIG 71 03/24/2021   CHOLHDL 3.0 03/24/2021    Significant Diagnostic Results in last 30 days:  No results found.  Assessment/Plan 1. Controlled type 2 diabetes mellitus without complication, without long-term current use of insulin (HCC) Lab Results  Component Value Date   HGBA1C 7.1 (H) 03/24/2021  A1C higher than previous level. Dietary modification discussed with patient and care giver. Will adjust metformin as below.Advised to avoid skipping meals.continue with exercise.  - unclear if taking ASA  - continue on ACE inhibitor  - annual eye exam up to date no retinopathy.  - metFORMIN (GLUCOPHAGE) 500 MG tablet; Take 1 tablet (500 mg total) by mouth 2 (two) times daily with a meal.  Dispense: 90 tablet; Refill: 1 - aspirin EC 81 MG tablet; Take 1 tablet (81 mg total) by mouth daily.  Dispense: 30 tablet; Refill: 3 - Ambulatory referral to Podiatry - Hemoglobin A1c; Future  2. Essential hypertension B/p stable  - continue current medication.  - aspirin EC 81 MG tablet; Take 1 tablet (81 mg total) by mouth daily.   Dispense: 30 tablet; Refill: 3 - CBC with Differential/Platelet; Future - CMP with eGFR([facility]); Future - TSH; Future  3. Acquired hypothyroidism Lab Results  Component Value Date   TSH 1.49 01/15/2021  Continue current levothyroxine  - TSH; Future  4. Hyperlipidemia associated with type 2 diabetes mellitus (HCC) LDL has improved but not at goal - dietary modification and exercise advised then will recheck lipid in 4 months.  - Lipid panel; Future  5. Stage 3b chronic kidney disease (HCC) CR slightly improved. - advised to avoid NSAID's and will continue to dose all other meds for renal clearance and avoid Nephrotoxins.   6. Dementia without behavioral disturbance, unspecified dementia type ([place]) No behavioral issues just forgetful repeats questions after few minutes multiple times during visit. - continue with supportive care. Has an assistant with he rADL's.   7. Need for Tdap vaccination Script send to pharmacy on previous visit but did not get vaccine.Advised to go to pharmacy after visit to get Tdap vaccine.Care giver verbalized understanding.  - Tdap ([place]) 5-2.5-18.5 LF-MCG/0.5 injection; Inject 0.5 mLs into the muscle once for 1 dose.  Dispense: 0.5 mL;  Refill: 0  8. Overgrown toenails Bilateral overgrown toenails. - Podiatrist to evaluate  Family/ staff Communication: Reviewed plan of care with patient and assistant verbalized understanding  Labs/tests ordered:  - CBC with Differential/Platelet; Future - CMP with eGFR([facility]); Future - TSH; Future - Lipid panel; Future - Hemoglobin A1c; Future  Next Appointment : 4 months for medical management of chronic issues.Fasting labs prior to visit.   [name], NP

## 2021-03-25 NOTE — Patient Instructions (Addendum)
-   Please take Metformin 500 mg tablet one by mouth twice daily for type 2 Diabetes.  - continue to exercise at least 3 times per week for 30 minutes.  - Recommend a low carbohydrates,low saturated fats and increase vegetables in diet.Avoid fried foods to lower cholesterol   - Please get your Tetanus vaccine at your pharmacy today then pharmacy will update office once you get vaccine

## 2021-04-04 NOTE — Progress Notes (Shared)
[facility] Note  04/08/2021     CHIEF COMPLAINT Patient presents for No chief complaint on file.   HISTORY OF PRESENT ILLNESS: Alejandra Davis is a 85 y.o. female who presents to the clinic today for:      Referring physician: Ngetich, Nelda Bucks, NP [place],  [place] [address]  HISTORICAL INFORMATION:   Selected notes from the MEDICAL RECORD NUMBER Referred by [name] for DM exam   CURRENT MEDICATIONS: Current Outpatient Medications (Ophthalmic Drugs)  Medication Sig  . dorzolamide-timolol (COSOPT) 22.3-6.8 MG/ML ophthalmic solution 1 drop 2 (two) times daily.   No current facility-administered medications for this visit. (Ophthalmic Drugs)   Current Outpatient Medications (Other)  Medication Sig  . amLODipine (NORVASC) 10 MG tablet TAKE 1 TABLET(10 MG) BY MOUTH DAILY  . aspirin EC 81 MG tablet Take 1 tablet (81 mg total) by mouth daily.  . Lancets Ultra Thin 30G MISC 100 each by Does not apply route 3 (three) times a week.  . levothyroxine (SYNTHROID) 75 MCG tablet Take 1 tablet (75 mcg total) by mouth daily.  Marland Kitchen lisinopril (ZESTRIL) 2.5 MG tablet Take 1 tablet (2.5 mg total) by mouth daily.  Marland Kitchen loratadine (CLARITIN) 10 MG tablet Take 1 tablet (10 mg total) by mouth daily.  . metFORMIN (GLUCOPHAGE) 500 MG tablet Take 1 tablet (500 mg total) by mouth 2 (two) times daily with a meal.  . metoprolol tartrate (LOPRESSOR) 50 MG tablet Take 1 tablet (50 mg total) by mouth daily.   No current facility-administered medications for this visit. (Other)      REVIEW OF SYSTEMS:    ALLERGIES Allergies  Allergen Reactions  . Donepezil Hcl     PAST MEDICAL HISTORY Past Medical History:  Diagnosis Date  . Chicken pox   . Cystocele   . Diabetes mellitus without complication ([place])   . Glaucoma   . Hypertension   . Hypertensive retinopathy    OU  . Neuromuscular disorder ([place])    Dementia   . Thyroid disease    hypothyroidism    Past Surgical History:  Procedure Laterality Date  . ABDOMINAL HYSTERECTOMY  1990   TAH BSO  . CATARACT EXTRACTION Bilateral    Dr. [name]  . EYE SURGERY Bilateral    Cat Sx OU  . OOPHORECTOMY     BSO  . Vaginal Bx     Papilloma    FAMILY HISTORY Family History  Problem Relation Age of Onset  . Sickle cell anemia Other   . Hypertension Mother   . Cancer Father        LIVER  . Heart disease Brother   . Cancer Brother        STOMACH    SOCIAL HISTORY Social History   Tobacco Use  . Smoking status: Never Smoker  . Smokeless tobacco: Never Used  Vaping Use  . Vaping Use: Never used  Substance Use Topics  . Alcohol use: No  . Drug use: No         OPHTHALMIC EXAM:  Not recorded     IMAGING AND PROCEDURES  Imaging and Procedures for 03/07/18           ASSESSMENT/PLAN:  No diagnosis found.  1,2. BRVO with CME OD  - at presentation, BCVA 20/60 OD -- down from 20/30  - initial OCT w/ CME superior macula  - s/p IVA OD #1 (10.18.19), #2 (11.19.19), #3 (12.17.19), #4 (02.19.20), #5 (  03.23.20), #6 (04.21.20), #7 (05.27.20), #8 (07.07.20), #9 (08.25.20), #10 (9.29.20), #11 (11.17.20), #12 (12.22.20), #13 (01.27.21), #14 (03.10.21), #15 (04.21.21), #16 (05.26.21), #17 (06.30.21), #18 (08.04.21), #19 (09.01.21), #20 (10.06.21), #21 (11.17.21), #22 (12.30.21), #23 (2.22.22)  - good response to medications, but held IVA in January 2020 due to cataract surgery  - today, BCVA 20/30  - OCT shows trace cystic changes temporal macula at 7 wk interval   - recommend IVA OD #24 today, 4.12.22 -- maintenance  - pt wishes to proceed  - RBA of procedure discussed, questions answered  - informed consent obtained   - [place] informed consent form signed and scanned on 01.27.2021  - see procedure note  - history of interval increase in IRF/cystic changes at 6+ weeks interval, but stable improvement in IRF/cystic changes at 7 wks today  - f/u 7 weeks -- DFE/OCT, possible  injection (tx and ext as able)  3. Diabetes mellitus, type 2 without retinopathy  - The incidence, risk factors for progression, natural history and treatment options for diabetic retinopathy  were discussed with patient.     - The need for close monitoring of blood glucose, blood pressure, and serum lipids, avoiding cigarette or any type of tobacco, and the need for long term follow up was also discussed with patient.   4. Epiretinal membrane, OU  - relatively mild ERM OU -- blunted central foveal depression  - OD with mild cystic changes 2/2 BRVO as above; OS without cystic changes or edema  - discussed findings and prognosis   - monitor  5. Pseudophakia OU  - s/p CE/IOL OS 11.13.19 w/ Dr. [name]  - s/p CE/IOL OD 01.16.20 w/ Dr. [name]  - beautiful surgeries -- IOLs in perfect position  - healing well post-operatively  - IRF/CME OD may have been partly due to post op CME ([place]) in addition to BRVO  6,7. Ocular hypertension / Glaucoma suspect OD>OS  - IOP ~25 OD, 20 OS at prior clinic visits  - today IOP 12,11  - denies any family hx of glaucoma  - under the expert care of Dr. [name]  - uses Cosopt BID OU  8,9. Hypertensive retinopathy OU  - pt presented to 6.24.2020 visit urgently for right sided headache above right eye with extension to occiput  - BP at that time was 201/100 -- pt was sent to primary care clinic for urgent evaluation and meds were adjusted  - BP now under better control and headaches improved  - discussed importance of tight BP control  Ophthalmic Meds Ordered this visit:  No orders of the defined types were placed in this encounter.     No follow-ups on file.  There are no Patient Instructions on file for this visit.  This document serves as a record of services personally performed by Gardiner Sleeper, MD, PhD. It was created on their behalf by Estill Bakes, COT an ophthalmic technician. The creation of this record is the provider's dictation  and/or activities during the visit.    Electronically signed by: Estill Bakes, COT 4.8.22 @ 3:55 PM  Abbreviations: M myopia (nearsighted); A astigmatism; H hyperopia (farsighted); P presbyopia; Mrx spectacle prescription;  CTL contact lenses; OD right eye; OS left eye; OU both eyes  XT exotropia; ET esotropia; PEK punctate epithelial keratitis; PEE punctate epithelial erosions; DES dry eye syndrome; MGD meibomian gland dysfunction; ATs artificial tears; PFAT's preservative free artificial tears; Soper nuclear sclerotic cataract; PSC posterior subcapsular cataract; ERM epi-retinal membrane; PVD posterior vitreous detachment;  RD retinal detachment; DM diabetes mellitus; DR diabetic retinopathy; NPDR non-proliferative diabetic retinopathy; PDR proliferative diabetic retinopathy; CSME clinically significant macular edema; DME diabetic macular edema; dbh dot blot hemorrhages; CWS cotton wool spot; POAG primary open angle glaucoma; C/D cup-to-disc ratio; HVF humphrey visual field; GVF goldmann visual field; OCT optical coherence tomography; IOP intraocular pressure; BRVO Branch retinal vein occlusion; CRVO central retinal vein occlusion; CRAO central retinal artery occlusion; BRAO branch retinal artery occlusion; RT retinal tear; SB scleral buckle; PPV pars plana vitrectomy; VH Vitreous hemorrhage; PRP panretinal laser photocoagulation; IVK intravitreal kenalog; VMT vitreomacular traction; MH Macular hole;  NVD neovascularization of the disc; NVE neovascularization elsewhere; AREDS age related eye disease study; ARMD age related macular degeneration; POAG primary open angle glaucoma; EBMD epithelial/anterior basement membrane dystrophy; ACIOL anterior chamber intraocular lens; IOL intraocular lens; PCIOL posterior chamber intraocular lens; Phaco/IOL phacoemulsification with intraocular lens placement; [place] photorefractive keratectomy; LASIK laser assisted in situ keratomileusis; HTN hypertension; DM diabetes mellitus;  COPD chronic obstructive pulmonary disease

## 2021-04-05 ENCOUNTER — Encounter (HOSPITAL_BASED_OUTPATIENT_CLINIC_OR_DEPARTMENT_OTHER): Payer: Self-pay | Admitting: *Deleted

## 2021-04-05 ENCOUNTER — Encounter: Payer: Self-pay | Admitting: Pulmonary Disease

## 2021-04-05 ENCOUNTER — Other Ambulatory Visit: Payer: Self-pay

## 2021-04-05 ENCOUNTER — Emergency Department (HOSPITAL_BASED_OUTPATIENT_CLINIC_OR_DEPARTMENT_OTHER): Payer: Medicare PPO

## 2021-04-05 ENCOUNTER — Inpatient Hospital Stay (HOSPITAL_BASED_OUTPATIENT_CLINIC_OR_DEPARTMENT_OTHER)
Admission: EM | Admit: 2021-04-05 | Discharge: 2021-04-09 | DRG: 379 | Disposition: A | Discharge status: Home / Self Care | Payer: Medicare PPO | Attending: Internal Medicine | Admitting: Internal Medicine

## 2021-04-05 DIAGNOSIS — K644 Residual hemorrhoidal skin tags: Secondary | ICD-10-CM | POA: Diagnosis present

## 2021-04-05 DIAGNOSIS — E785 Hyperlipidemia, unspecified: Secondary | ICD-10-CM | POA: Diagnosis present

## 2021-04-05 DIAGNOSIS — K922 Gastrointestinal hemorrhage, unspecified: Secondary | ICD-10-CM

## 2021-04-05 DIAGNOSIS — I38 Endocarditis, valve unspecified: Secondary | ICD-10-CM | POA: Diagnosis not present

## 2021-04-05 DIAGNOSIS — R7989 Other specified abnormal findings of blood chemistry: Secondary | ICD-10-CM | POA: Diagnosis present

## 2021-04-05 DIAGNOSIS — E1122 Type 2 diabetes mellitus with diabetic chronic kidney disease: Secondary | ICD-10-CM | POA: Diagnosis present

## 2021-04-05 DIAGNOSIS — K625 Hemorrhage of anus and rectum: Secondary | ICD-10-CM | POA: Diagnosis not present

## 2021-04-05 DIAGNOSIS — K921 Melena: Secondary | ICD-10-CM | POA: Diagnosis not present

## 2021-04-05 DIAGNOSIS — F0280 Dementia in other diseases classified elsewhere without behavioral disturbance: Secondary | ICD-10-CM | POA: Diagnosis present

## 2021-04-05 DIAGNOSIS — Z79899 Other long term (current) drug therapy: Secondary | ICD-10-CM | POA: Diagnosis not present

## 2021-04-05 DIAGNOSIS — K8020 Calculus of gallbladder without cholecystitis without obstruction: Secondary | ICD-10-CM | POA: Diagnosis not present

## 2021-04-05 DIAGNOSIS — G301 Alzheimer's disease with late onset: Secondary | ICD-10-CM | POA: Diagnosis present

## 2021-04-05 DIAGNOSIS — Z20822 Contact with and (suspected) exposure to covid-19: Secondary | ICD-10-CM | POA: Diagnosis present

## 2021-04-05 DIAGNOSIS — Z7984 Long term (current) use of oral hypoglycemic drugs: Secondary | ICD-10-CM | POA: Diagnosis not present

## 2021-04-05 DIAGNOSIS — N1831 Chronic kidney disease, stage 3a: Secondary | ICD-10-CM

## 2021-04-05 DIAGNOSIS — R079 Chest pain, unspecified: Secondary | ICD-10-CM | POA: Diagnosis not present

## 2021-04-05 DIAGNOSIS — K5730 Diverticulosis of large intestine without perforation or abscess without bleeding: Secondary | ICD-10-CM | POA: Diagnosis not present

## 2021-04-05 DIAGNOSIS — K640 First degree hemorrhoids: Secondary | ICD-10-CM | POA: Diagnosis present

## 2021-04-05 DIAGNOSIS — I10 Essential (primary) hypertension: Secondary | ICD-10-CM

## 2021-04-05 DIAGNOSIS — Z79890 Hormone replacement therapy (postmenopausal): Secondary | ICD-10-CM

## 2021-04-05 DIAGNOSIS — I712 Thoracic aortic aneurysm, without rupture: Secondary | ICD-10-CM | POA: Diagnosis present

## 2021-04-05 DIAGNOSIS — I6381 Other cerebral infarction due to occlusion or stenosis of small artery: Secondary | ICD-10-CM | POA: Diagnosis not present

## 2021-04-05 DIAGNOSIS — Z800 Family history of malignant neoplasm of digestive organs: Secondary | ICD-10-CM | POA: Diagnosis not present

## 2021-04-05 DIAGNOSIS — Z832 Family history of diseases of the blood and blood-forming organs and certain disorders involving the immune mechanism: Secondary | ICD-10-CM | POA: Diagnosis not present

## 2021-04-05 DIAGNOSIS — J9811 Atelectasis: Secondary | ICD-10-CM | POA: Diagnosis not present

## 2021-04-05 DIAGNOSIS — E039 Hypothyroidism, unspecified: Secondary | ICD-10-CM | POA: Diagnosis present

## 2021-04-05 DIAGNOSIS — Z7982 Long term (current) use of aspirin: Secondary | ICD-10-CM | POA: Diagnosis not present

## 2021-04-05 DIAGNOSIS — R93421 Abnormal radiologic findings on diagnostic imaging of right kidney: Secondary | ICD-10-CM | POA: Diagnosis present

## 2021-04-05 DIAGNOSIS — Z8249 Family history of ischemic heart disease and other diseases of the circulatory system: Secondary | ICD-10-CM

## 2021-04-05 DIAGNOSIS — R000 Tachycardia, unspecified: Secondary | ICD-10-CM | POA: Diagnosis present

## 2021-04-05 DIAGNOSIS — R93422 Abnormal radiologic findings on diagnostic imaging of left kidney: Secondary | ICD-10-CM | POA: Diagnosis present

## 2021-04-05 DIAGNOSIS — K5731 Diverticulosis of large intestine without perforation or abscess with bleeding: Principal | ICD-10-CM | POA: Diagnosis present

## 2021-04-05 DIAGNOSIS — E119 Type 2 diabetes mellitus without complications: Secondary | ICD-10-CM | POA: Diagnosis not present

## 2021-04-05 DIAGNOSIS — Z90710 Acquired absence of both cervix and uterus: Secondary | ICD-10-CM | POA: Diagnosis not present

## 2021-04-05 DIAGNOSIS — H409 Unspecified glaucoma: Secondary | ICD-10-CM | POA: Diagnosis present

## 2021-04-05 DIAGNOSIS — H35039 Hypertensive retinopathy, unspecified eye: Secondary | ICD-10-CM | POA: Diagnosis present

## 2021-04-05 DIAGNOSIS — I129 Hypertensive chronic kidney disease with stage 1 through stage 4 chronic kidney disease, or unspecified chronic kidney disease: Secondary | ICD-10-CM | POA: Diagnosis present

## 2021-04-05 DIAGNOSIS — K5791 Diverticulosis of intestine, part unspecified, without perforation or abscess with bleeding: Secondary | ICD-10-CM | POA: Diagnosis not present

## 2021-04-05 DIAGNOSIS — Z8719 Personal history of other diseases of the digestive system: Secondary | ICD-10-CM | POA: Diagnosis not present

## 2021-04-05 LAB — COMPREHENSIVE METABOLIC PANEL
ALT: 18 U/L (ref 0–44)
AST: 20 U/L (ref 15–41)
Albumin: 3.2 g/dL — ABNORMAL LOW (ref 3.5–5.0)
Alkaline Phosphatase: 54 U/L (ref 38–126)
Anion gap: 8 (ref 5–15)
BUN: 20 mg/dL (ref 8–23)
CO2: 27 mmol/L (ref 22–32)
Calcium: 8.8 mg/dL — ABNORMAL LOW (ref 8.9–10.3)
Chloride: 104 mmol/L (ref 98–111)
Creatinine, Ser: 1.31 mg/dL — ABNORMAL HIGH (ref 0.44–1.00)
GFR, Estimated: 41 mL/min — ABNORMAL LOW (ref >60)
Glucose, Bld: 122 mg/dL — ABNORMAL HIGH (ref 70–99)
Potassium: 3.7 mmol/L (ref 3.5–5.1)
Sodium: 139 mmol/L (ref 135–145)
Total Bilirubin: 0.2 mg/dL — ABNORMAL LOW (ref 0.3–1.2)
Total Protein: 6.6 g/dL (ref 6.5–8.1)

## 2021-04-05 LAB — CBC WITH DIFFERENTIAL/PLATELET
Abs Immature Granulocytes: 0.04 10*3/uL (ref 0.00–0.07)
Basophils Absolute: 0 10*3/uL (ref 0.0–0.1)
Basophils Relative: 1 %
Eosinophils Absolute: 0.6 10*3/uL — ABNORMAL HIGH (ref 0.0–0.5)
Eosinophils Relative: 9 %
HCT: 35 % — ABNORMAL LOW (ref 36.0–46.0)
Hemoglobin: 11.4 g/dL — ABNORMAL LOW (ref 12.0–15.0)
Immature Granulocytes: 1 %
Lymphocytes Relative: 22 %
Lymphs Abs: 1.3 10*3/uL (ref 0.7–4.0)
MCH: 26.7 pg (ref 26.0–34.0)
MCHC: 32.6 g/dL (ref 30.0–36.0)
MCV: 82 fL (ref 80.0–100.0)
Monocytes Absolute: 0.6 10*3/uL (ref 0.1–1.0)
Monocytes Relative: 10 %
Neutro Abs: 3.4 10*3/uL (ref 1.7–7.7)
Neutrophils Relative %: 57 %
Platelets: 232 10*3/uL (ref 150–400)
RBC: 4.27 MIL/uL (ref 3.87–5.11)
RDW: 17 % — ABNORMAL HIGH (ref 11.5–15.5)
WBC: 6 10*3/uL (ref 4.0–10.5)
nRBC: 0 % (ref 0.0–0.2)

## 2021-04-05 LAB — RESP PANEL BY RT-PCR (FLU A&B, COVID) ARPGX2
Influenza A by PCR: NEGATIVE
Influenza B by PCR: NEGATIVE
SARS Coronavirus 2 by RT PCR: NEGATIVE

## 2021-04-05 LAB — CBC
HCT: 40.6 % (ref 36.0–46.0)
Hemoglobin: 13.2 g/dL (ref 12.0–15.0)
MCH: 27.8 pg (ref 26.0–34.0)
MCHC: 32.5 g/dL (ref 30.0–36.0)
MCV: 85.5 fL (ref 80.0–100.0)
Platelets: 160 10*3/uL (ref 150–400)
RBC: 4.75 MIL/uL (ref 3.87–5.11)
RDW: 16 % — ABNORMAL HIGH (ref 11.5–15.5)
WBC: 9.4 10*3/uL (ref 4.0–10.5)
nRBC: 0 % (ref 0.0–0.2)

## 2021-04-05 LAB — PROTIME-INR
INR: 1 (ref 0.8–1.2)
Prothrombin Time: 12.6 seconds (ref 11.4–15.2)

## 2021-04-05 LAB — CBG MONITORING, ED: Glucose-Capillary: 151 mg/dL — ABNORMAL HIGH (ref 70–99)

## 2021-04-05 MED ORDER — SODIUM CHLORIDE 0.9 % IV BOLUS
500.0000 mL | Freq: Once | INTRAVENOUS | Status: AC
  Administered 2021-04-05: 500 mL via INTRAVENOUS

## 2021-04-05 MED ORDER — SODIUM CHLORIDE 0.9% IV SOLUTION
Freq: Once | INTRAVENOUS | Status: DC
  Filled 2021-04-05: qty 250

## 2021-04-05 MED ORDER — IOHEXOL 350 MG/ML SOLN
80.0000 mL | Freq: Once | INTRAVENOUS | Status: AC | PRN
  Administered 2021-04-05: 80 mL via INTRAVENOUS

## 2021-04-05 NOTE — ED Provider Notes (Signed)
[facility] EMERGENCY DEPARTMENT Provider Note   CSN: [id number] Arrival date & time: 04/05/21  1853     History Chief Complaint  Patient presents with  . Rectal Bleeding    Alejandra Davis is a 85 y.o. female.  The history is provided by the patient, medical records and a relative.  Vaginal Bleeding  Alejandra Davis is a 85 y.o. female who presents to the Emergency Department complaining of rectal bleeding. She presents the emergency department accompanied by her granddaughter for evaluation of bright red blood per rectum at that started one hour prior to ED arrival. She states that she sat on the commode and had several episodes of dark blood in the stool. She does have a history of Alzheimer's, which limits history taking. She denies any chest pain, shortness of breath. She does have mild nausea. No vomiting. No prior similar symptoms. She does not take any blood thinners. She is status post hysterectomy. She does not have a gastroenterologist. She is fully vaccinated and boosted for COVID-19. No prior history of G.I. bleeding.    Past Medical History:  Diagnosis Date  . Chicken pox   . Cystocele   . Diabetes mellitus without complication ([place])   . Glaucoma   . Hypertension   . Hypertensive retinopathy    OU  . Neuromuscular disorder ([place])    Dementia   . Thyroid disease    hypothyroidism    Patient Active Problem List   Diagnosis Date Noted  . Stage 3b chronic kidney disease ([place]) 09/17/2020  . Hyperlipidemia associated with type 2 diabetes mellitus ([place]) 09/17/2020  . Flexural atopic dermatitis 05/30/2019  . Presbycusis of both ears 05/11/2019  . Acquired hypothyroidism 03/13/2019  . Murmur 09/01/2018  . Telogen effluvium 06/07/2018  . Eczema of scalp 03/31/2018  . Xerosis cutis 02/25/2018  . Immunization reaction 02/08/2018  . Keratosis pilaris 02/08/2018  . Health care maintenance 02/07/2018  . Controlled type 2 diabetes mellitus without complication,  without long-term current use of insulin ([place]) 02/07/2018  . Cystocele   . Osteopenia   . Thyroid disease   . Hypertension     Past Surgical History:  Procedure Laterality Date  . ABDOMINAL HYSTERECTOMY  1990   TAH BSO  . CATARACT EXTRACTION Bilateral    Dr. [name]  . EYE SURGERY Bilateral    Cat Sx OU  . OOPHORECTOMY     BSO  . Vaginal Bx     Papilloma     OB History    Gravida  3   Para  2   Term  2   Preterm      AB  1   Living  2     SAB  1   IAB      Ectopic      Multiple      Live Births              Family History  Problem Relation Age of Onset  . Sickle cell anemia Other   . Hypertension Mother   . Cancer Father        LIVER  . Heart disease Brother   . Cancer Brother        STOMACH    Social History   Tobacco Use  . Smoking status: Never Smoker  . Smokeless tobacco: Never Used  Vaping Use  . Vaping Use: Never used  Substance Use Topics  . Alcohol use: No  . Drug use: No  Home Medications Prior to Admission medications   Medication Sig Start Date End Date Taking? Authorizing Provider  amLODipine (NORVASC) 10 MG tablet TAKE 1 TABLET(10 MG) BY MOUTH DAILY 12/11/20   Dutch Quint B, FNP  aspirin EC 81 MG tablet Take 1 tablet (81 mg total) by mouth daily. 03/25/21   Ngetich, Dinah C, NP  dorzolamide-timolol (COSOPT) 22.3-6.8 MG/ML ophthalmic solution 1 drop 2 (two) times daily. 10/15/20   [provider]  Lancets Ultra Thin 30G MISC 100 each by Does not apply route 3 (three) times a week. 09/18/20   Ngetich, Dinah C, NP  levothyroxine (SYNTHROID) 75 MCG tablet Take 1 tablet (75 mcg total) by mouth daily. 11/13/20   Ngetich, Dinah C, NP  lisinopril (ZESTRIL) 2.5 MG tablet Take 1 tablet (2.5 mg total) by mouth daily. 01/01/21   Ngetich, Dinah C, NP  loratadine (CLARITIN) 10 MG tablet Take 1 tablet (10 mg total) by mouth daily. 03/04/21   Ngetich, Dinah C, NP  metFORMIN (GLUCOPHAGE) 500 MG tablet Take 1 tablet (500 mg total) by  mouth 2 (two) times daily with a meal. 03/25/21   Ngetich, Dinah C, NP  metoprolol tartrate (LOPRESSOR) 50 MG tablet Take 1 tablet (50 mg total) by mouth daily. 01/01/21   Ngetich, Nelda Bucks, NP    Allergies    Donepezil hcl  Review of Systems   Review of Systems  Genitourinary: Positive for vaginal bleeding.  All other systems reviewed and are negative.   Physical Exam Updated Vital Signs BP (!) 178/114   Pulse (!) 101   Temp (!) 97.5 F (36.4 C) (Oral)   Resp 16   Ht 5\' 3"  (1.6 m)   Wt 52.2 kg   SpO2 100%   BMI 20.37 kg/m   Physical Exam Vitals and nursing note reviewed.  Constitutional:      Appearance: She is well-developed.  HENT:     Head: Normocephalic and atraumatic.  Cardiovascular:     Rate and Rhythm: Regular rhythm. Tachycardia present.     Heart sounds: No murmur heard.   Pulmonary:     Effort: Pulmonary effort is normal. No respiratory distress.     Breath sounds: Normal breath sounds.  Abdominal:     Palpations: Abdomen is soft.     Tenderness: There is no abdominal tenderness. There is no guarding or rebound.  Genitourinary:    Comments: External hemorrhoid. Dark red blood in the rectal vault Musculoskeletal:        General: No tenderness.  Skin:    General: Skin is warm and dry.  Neurological:     Mental Status: She is alert and oriented to person, place, and time.  Psychiatric:        Behavior: Behavior normal.     ED Results / Procedures / Treatments   Labs (all labs ordered are listed, but only abnormal results are displayed) Labs Reviewed  COMPREHENSIVE METABOLIC PANEL - Abnormal; Notable for the following components:      Result Value   Glucose, Bld 122 (*)    Creatinine, Ser 1.31 (*)    Calcium 8.8 (*)    Albumin 3.2 (*)    Total Bilirubin 0.2 (*)    GFR, Estimated 41 (*)    All other components within normal limits  CBC WITH DIFFERENTIAL/PLATELET - Abnormal; Notable for the following components:   Hemoglobin 11.4 (*)    HCT  35.0 (*)    RDW 17.0 (*)    Eosinophils Absolute 0.6 (*)  All other components within normal limits  CBG MONITORING, ED - Abnormal; Notable for the following components:   Glucose-Capillary 151 (*)    All other components within normal limits  RESP PANEL BY RT-PCR (FLU A&B, COVID) ARPGX2  PROTIME-INR  CBC  PREPARE RBC (CROSSMATCH)  TYPE AND SCREEN    EKG EKG Interpretation  Date/Time:  Saturday April 05 2021 19:08:44 EDT Ventricular Rate:  140 PR Interval:  80 QRS Duration: 120 QT Interval:  290 QTC Calculation: 443 R Axis:   87 Text Interpretation: Sinus tachycardia Nonspecific intraventricular conduction delay ST depression, probably rate related Confirmed by Quintella Reichert [id number] on 04/05/2021 7:12:16 PM   Radiology CT Angio Abd/Pel W and/or Wo Contrast  Result Date: 04/05/2021 CLINICAL DATA:  GI bleed EXAM: CTA ABDOMEN AND PELVIS WITHOUT AND WITH CONTRAST TECHNIQUE: Multidetector CT imaging of the abdomen and pelvis was performed using the standard protocol during bolus administration of intravenous contrast. Multiplanar reconstructed images and MIPs were obtained and reviewed to evaluate the vascular anatomy. CONTRAST:  65mL OMNIPAQUE IOHEXOL 350 MG/ML SOLN COMPARISON:  09/16/2019. FINDINGS: VASCULAR Aortic atherosclerosis. Redemonstrated chronic penetrating ulceration and/or dissection of the distal descending thoracic aorta (series 5, image 20). No aneurysm. Standard branching pattern of the abdominal aorta with solitary bilateral renal arteries. Review of the MIP images confirms the above findings. NON-VASCULAR Lower chest: No acute abnormality. Hepatobiliary: No focal liver abnormality is seen. No gallstones, gallbladder wall thickening, or biliary dilatation. Pancreas: Unremarkable. No pancreatic ductal dilatation or surrounding inflammatory changes. Spleen: Normal in size without focal abnormality. Adrenals/Urinary Tract: Adrenal glands are unremarkable. There are numerous  tiny hypoenhancing lesions of the bilateral renal cortices. Thickening of the bladder. Large diverticulum of the posterior left aspect of the bladder. Stomach/Bowel: Stomach is within normal limits. There is a large volume of hyperdense fluid in the transverse colon and distally to the rectum. Sigmoid diverticulosis. Lymphatic: No enlarged abdominal or pelvic lymph nodes. Reproductive: Status post hysterectomy. Other: No abdominal wall hernia or abnormality. No abdominopelvic ascites. Musculoskeletal: No acute or significant osseous findings. IMPRESSION: 1. Large volume of hyperdense fluid in the transverse colon distally to the rectum, presumably blood. No evidence of specific nidus of GI bleeding or intraluminal contrast extravasation. 2. Sigmoid diverticulosis without evidence of acute diverticulitis. 3. Numerous tiny hypoenhancing lesions of the bilateral renal cortices, suggesting embolic shower, of uncertain acuity. 4. Redemonstrated chronic penetrating ulceration or dissection of the distal descending thoracic aorta. No aneurysm. 5.  Aortic Atherosclerosis (ICD10-I70.0). 6. Thickening of the urinary bladder, suggestive of nonspecific infectious or inflammatory cystitis. Correlate with urinalysis. Electronically Signed   By: Eddie Candle M.D.   On: 04/05/2021 21:10    Procedures Procedures  CRITICAL CARE Performed by: Quintella Reichert   Total critical care time: 120 minutes  Critical care time was exclusive of separately billable procedures and treating other patients.  Critical care was necessary to treat or prevent imminent or life-threatening deterioration.  Critical care was time spent personally by me on the following activities: development of treatment plan with patient and/or surrogate as well as nursing, discussions with consultants, evaluation of patient's response to treatment, examination of patient, obtaining history from patient or surrogate, ordering and performing treatments and  interventions, ordering and review of laboratory studies, ordering and review of radiographic studies, pulse oximetry and re-evaluation of patient's condition.  Medications Ordered in ED Medications  0.9 %  sodium chloride infusion (Manually program via Guardrails IV Fluids) (has no administration in time range)  0.9 %  sodium chloride infusion (Manually program via Guardrails IV Fluids) (has no administration in time range)  sodium chloride 0.9 % bolus 500 mL (0 mLs Intravenous Stopped 04/05/21 2143)  iohexol (OMNIPAQUE) 350 MG/ML injection 80 mL (80 mLs Intravenous Contrast Given 04/05/21 2031)    ED Course  I have reviewed the triage vital signs and the nursing notes.  Pertinent labs & imaging results that were available during my care of the patient were reviewed by me and considered in my medical decision making (see chart for details).    MDM Rules/Calculators/A&P                          Patient here for evaluation of hematochezia that started one hour prior to ED presentation. On initial assessment patient tachycardic but well perfused on evaluation. She does have gross blood on rectal examination. Initial hemoglobin returned and 11, which is decreased from 13 just over a week ago. During ED stay patient developed numerous episodes of gross blood per rectum. Discussed with Dr. Therisa Doyne with [facility] recommends CTA to evaluate for possible source that could be analyzed by IR.  CTA was obtained. While patient was in the scanner she had a. Where she became less responsive. She would open her eyes to verbal stimuli but was not on verbal and had difficulty following commands. She was treated with IV fluid bolus and on return to her room and she was treated with emergency release blood due to concern for poor perfusion due to active G.I. bleed resulting in change in mental status. Patient's mental status did improve after fluid and blood administration.  CTA without inch available lesion for IR.  Discussed the findings with Dr. Therisa Doyne. Patient does have an incidental thoracic aortic ulcer noted. Discussed with Dr. [name] with vascular surgery, this can be followed up as an outpatient is likely not contributing to her current symptoms. Dr. Therisa Doyne recommends H&H Q6 hours as well as a nuclear medicine bleeding scan upon the patient's arrival to [facility]  Discussed the patient with Dr. Kathrynn Humble in the [facility] emergency department, who accepts the patient in transfer. Discussed with intensivist, he will see the patient in transfer as well. Final Clinical Impression(s) / ED Diagnoses Final diagnoses:  Lower GI bleed    Rx / DC Orders ED Discharge Orders    None       Quintella Reichert, MD 04/06/21 0006

## 2021-04-05 NOTE — ED Notes (Signed)
Large volume gelatinous bloody stool at this time.

## 2021-04-05 NOTE — ED Notes (Signed)
Placed patient on 2L /[place]. Patient actively rectally bleeding. Post [place] patient oxygen saturation increased to 94%. Will continue to monitor. Patient tolerating well.

## 2021-04-05 NOTE — ED Triage Notes (Signed)
Pt reports she went to urinate approx 1 hour ago and noticed "a lot of blood". Pt uncertain where the bleeding is coming from. Denies pain at present but reports cramping earlier

## 2021-04-05 NOTE — ED Notes (Signed)
EMT pulled this RN to bedside due to pt c/o "more bleeding". This RN to bedside, on arrival pt had on a saturated maxi pad and a pad under her with blood reaching down to her socks. EDP called to bedside to visualize bleeding amount and reassessed patient. Pads under patient changed. Pt denies any pain and states her only sx is the bleeding she is experiencing. Family and patient updated on plan of care.

## 2021-04-05 NOTE — ED Notes (Signed)
[facility] AT BEDSIDE 

## 2021-04-05 NOTE — ED Provider Notes (Signed)
  Physical Exam  BP (!) 156/94   Pulse (!) 103   Temp (!) 97.5 F (36.4 C) (Oral)   Resp 14   Ht 5\' 3"  (1.6 m)   Wt 52.2 kg   SpO2 99%   BMI 20.37 kg/m   Physical Exam  ED Course/Procedures     .Critical Care Performed by: Varney Biles, MD Authorized by: Varney Biles, MD   Critical care provider statement:    Critical care time (minutes):  34   Critical care was necessary to treat or prevent imminent or life-threatening deterioration of the following conditions: GI bleed.   Critical care was time spent personally by me on the following activities:  Discussions with consultants, evaluation of patient's response to treatment, examination of patient, ordering and performing treatments and interventions, ordering and review of laboratory studies, ordering and review of radiographic studies, pulse oximetry, re-evaluation of patient's condition, obtaining history from patient or surrogate and review of old charts    MDM    Patient transferred from [place] with GI bleed.  Patient is not on any aspirin, Plavix, blood thinners.  She has no history of GI bleed.  She reports having bloody commode earlier today.  While at the freestanding ER, patient bloodied 5 checks.  She also became less responsive and was transfused with 2 units of PRBC prior to ED transfer.  Patient currently does not complain of pain.  She also denies any bloody stool at the moment.  The urine that is being collected is bloody, so assuming she is still having slow bleed.  Repeat CBC ordered and her hemoglobin is 13.2, up from 11.4.  It is most likely not true hemoglobin.  I read discussed the case with GI team.  Nuclear medicine scan has been ordered.  I have discussed the case with radiology service and they have called their on-call tech to come in.  Hemodynamically patient is stable besides tachycardia. She has been typed and screened here.  Will admit to medicine.        Varney Biles, MD 04/05/21 [phone]

## 2021-04-05 NOTE — Progress Notes (Unsigned)
  Alejandra Davis [id number]  Received call from ED physician Dr. Ralene Bathe at [facility] ER.  Patient in need of ICU admission for lower GI bleeding.   Patient presented with BRBPR which started 1 hr prior to ED arrival, had 4 episodes of BRBPR in the ED.  Not on blood thinners.   Hx of DM, Alzhiemer's, HTN, glaucoma, CKD 3  Current vitals: HR 95 154/86 Afebrile 100% sats  Received 2 units pRBCs IVF 1.5L bolus  IV access:  2 PIVs  Plan to insert 3rd PIV.  hgb 11.4, baseline ~13 CTA: IMPRESSION: 1. Large volume of hyperdense fluid in the transverse colon distally to the rectum, presumably blood. No evidence of specific nidus of GI bleeding or intraluminal contrast extravasation. 2. Sigmoid diverticulosis without evidence of acute diverticulitis. 3. Numerous tiny hypoenhancing lesions of the bilateral renal cortices, suggesting embolic shower, of uncertain acuity. 4. Redemonstrated chronic penetrating ulceration or dissection of the distal descending thoracic aorta. No aneurysm. 5.  Aortic Atherosclerosis (ICD10-I70.0). 6. Thickening of the urinary bladder, suggestive of nonspecific infectious or inflammatory cystitis. Correlate with urinalysis.    ED doc spoke with Vascular surg:  [name] thought ulcer was chronic ED doc spoke with GI:  Dr. Therisa Doyne, CTA shows no active extravasation of blood, requests nuclear scan  Patient will be transferred via [place] ED to ED.  Accepting ED doc, Dr. Luellen Pucker. ED will consult critical care upon patient arrival.    ___________________ Samuella Cota. Reinaldo Berber, MD  Pulmonary & Critical Care

## 2021-04-05 NOTE — ED Notes (Signed)
Assumed care at this time, pt receiving emergency release blood after unresponsive episode in CT.

## 2021-04-05 NOTE — ED Notes (Signed)
This RN called to CT by EDP due to pt change in condition. Per EDP and CT staff pt less responsive and diaphoretic. On arrival to CT room pt was still in scanner. EDP present. Once pt was removed from scanner this RN checked responsiveness. Pt awake and responding, but diaphoretic. Pt HR 90 and irregular. VS taken and pt transferred from CT table back to room for continued treatment.

## 2021-04-05 NOTE — ED Notes (Signed)
2nd unit of blood actively infusing at time of [facility] transport. En route to [facility] ED at this time. Son taking all belongings (clothing, coat, purse) home with him, 1 pair of socks on patient.

## 2021-04-06 ENCOUNTER — Observation Stay (HOSPITAL_COMMUNITY): Payer: Medicare PPO

## 2021-04-06 ENCOUNTER — Encounter (HOSPITAL_COMMUNITY): Payer: Self-pay | Admitting: Internal Medicine

## 2021-04-06 DIAGNOSIS — K922 Gastrointestinal hemorrhage, unspecified: Secondary | ICD-10-CM | POA: Diagnosis present

## 2021-04-06 DIAGNOSIS — R079 Chest pain, unspecified: Secondary | ICD-10-CM | POA: Diagnosis not present

## 2021-04-06 DIAGNOSIS — Z8249 Family history of ischemic heart disease and other diseases of the circulatory system: Secondary | ICD-10-CM | POA: Diagnosis not present

## 2021-04-06 DIAGNOSIS — R000 Tachycardia, unspecified: Secondary | ICD-10-CM | POA: Diagnosis present

## 2021-04-06 DIAGNOSIS — Z79890 Hormone replacement therapy (postmenopausal): Secondary | ICD-10-CM | POA: Diagnosis not present

## 2021-04-06 DIAGNOSIS — N1831 Chronic kidney disease, stage 3a: Secondary | ICD-10-CM | POA: Diagnosis present

## 2021-04-06 DIAGNOSIS — Z8719 Personal history of other diseases of the digestive system: Secondary | ICD-10-CM | POA: Diagnosis not present

## 2021-04-06 DIAGNOSIS — I6381 Other cerebral infarction due to occlusion or stenosis of small artery: Secondary | ICD-10-CM | POA: Diagnosis not present

## 2021-04-06 DIAGNOSIS — H409 Unspecified glaucoma: Secondary | ICD-10-CM | POA: Diagnosis present

## 2021-04-06 DIAGNOSIS — E039 Hypothyroidism, unspecified: Secondary | ICD-10-CM | POA: Diagnosis present

## 2021-04-06 DIAGNOSIS — F0280 Dementia in other diseases classified elsewhere without behavioral disturbance: Secondary | ICD-10-CM | POA: Diagnosis present

## 2021-04-06 DIAGNOSIS — I38 Endocarditis, valve unspecified: Secondary | ICD-10-CM

## 2021-04-06 DIAGNOSIS — Z7982 Long term (current) use of aspirin: Secondary | ICD-10-CM | POA: Diagnosis not present

## 2021-04-06 DIAGNOSIS — Z79899 Other long term (current) drug therapy: Secondary | ICD-10-CM | POA: Diagnosis not present

## 2021-04-06 DIAGNOSIS — Z7984 Long term (current) use of oral hypoglycemic drugs: Secondary | ICD-10-CM | POA: Diagnosis not present

## 2021-04-06 DIAGNOSIS — E1122 Type 2 diabetes mellitus with diabetic chronic kidney disease: Secondary | ICD-10-CM | POA: Diagnosis present

## 2021-04-06 DIAGNOSIS — J9811 Atelectasis: Secondary | ICD-10-CM | POA: Diagnosis not present

## 2021-04-06 DIAGNOSIS — K8020 Calculus of gallbladder without cholecystitis without obstruction: Secondary | ICD-10-CM | POA: Diagnosis not present

## 2021-04-06 DIAGNOSIS — G301 Alzheimer's disease with late onset: Secondary | ICD-10-CM | POA: Diagnosis present

## 2021-04-06 DIAGNOSIS — K5731 Diverticulosis of large intestine without perforation or abscess with bleeding: Secondary | ICD-10-CM | POA: Diagnosis present

## 2021-04-06 DIAGNOSIS — I712 Thoracic aortic aneurysm, without rupture: Secondary | ICD-10-CM | POA: Diagnosis present

## 2021-04-06 DIAGNOSIS — K640 First degree hemorrhoids: Secondary | ICD-10-CM | POA: Diagnosis present

## 2021-04-06 DIAGNOSIS — E119 Type 2 diabetes mellitus without complications: Secondary | ICD-10-CM | POA: Diagnosis not present

## 2021-04-06 DIAGNOSIS — Z800 Family history of malignant neoplasm of digestive organs: Secondary | ICD-10-CM | POA: Diagnosis not present

## 2021-04-06 DIAGNOSIS — Z20822 Contact with and (suspected) exposure to covid-19: Secondary | ICD-10-CM | POA: Diagnosis present

## 2021-04-06 DIAGNOSIS — I129 Hypertensive chronic kidney disease with stage 1 through stage 4 chronic kidney disease, or unspecified chronic kidney disease: Secondary | ICD-10-CM | POA: Diagnosis present

## 2021-04-06 DIAGNOSIS — Z832 Family history of diseases of the blood and blood-forming organs and certain disorders involving the immune mechanism: Secondary | ICD-10-CM | POA: Diagnosis not present

## 2021-04-06 DIAGNOSIS — R7989 Other specified abnormal findings of blood chemistry: Secondary | ICD-10-CM | POA: Diagnosis present

## 2021-04-06 DIAGNOSIS — H35039 Hypertensive retinopathy, unspecified eye: Secondary | ICD-10-CM | POA: Diagnosis present

## 2021-04-06 DIAGNOSIS — K644 Residual hemorrhoidal skin tags: Secondary | ICD-10-CM | POA: Diagnosis present

## 2021-04-06 DIAGNOSIS — Z90710 Acquired absence of both cervix and uterus: Secondary | ICD-10-CM | POA: Diagnosis not present

## 2021-04-06 DIAGNOSIS — E785 Hyperlipidemia, unspecified: Secondary | ICD-10-CM | POA: Diagnosis present

## 2021-04-06 LAB — ECHOCARDIOGRAM COMPLETE
AR max vel: 2.47 cm2
AV Area VTI: 2.24 cm2
AV Area mean vel: 2.13 cm2
AV Mean grad: 7 mmHg
AV Peak grad: 12.3 mmHg
Ao pk vel: 1.75 m/s
Area-P 1/2: 6.32 cm2
Calc EF: 63.3 %
Height: 63 in
S' Lateral: 1.4 cm
Single Plane A2C EF: 63.9 %
Single Plane A4C EF: 66.3 %
Weight: 1894.19 oz

## 2021-04-06 LAB — CBC
HCT: 34.6 % — ABNORMAL LOW (ref 36.0–46.0)
HCT: 36.9 % (ref 36.0–46.0)
HCT: 37.6 % (ref 36.0–46.0)
Hemoglobin: 11.6 g/dL — ABNORMAL LOW (ref 12.0–15.0)
Hemoglobin: 12.3 g/dL (ref 12.0–15.0)
Hemoglobin: 12.4 g/dL (ref 12.0–15.0)
MCH: 27.8 pg (ref 26.0–34.0)
MCH: 28 pg (ref 26.0–34.0)
MCH: 28.1 pg (ref 26.0–34.0)
MCHC: 33 g/dL (ref 30.0–36.0)
MCHC: 33.3 g/dL (ref 30.0–36.0)
MCHC: 33.5 g/dL (ref 30.0–36.0)
MCV: 83.4 fL (ref 80.0–100.0)
MCV: 84.2 fL (ref 80.0–100.0)
MCV: 84.3 fL (ref 80.0–100.0)
Platelets: 128 10*3/uL — ABNORMAL LOW (ref 150–400)
Platelets: 169 10*3/uL (ref 150–400)
Platelets: 169 10*3/uL (ref 150–400)
RBC: 4.15 MIL/uL (ref 3.87–5.11)
RBC: 4.38 MIL/uL (ref 3.87–5.11)
RBC: 4.46 MIL/uL (ref 3.87–5.11)
RDW: 15.9 % — ABNORMAL HIGH (ref 11.5–15.5)
RDW: 16.1 % — ABNORMAL HIGH (ref 11.5–15.5)
RDW: 16.2 % — ABNORMAL HIGH (ref 11.5–15.5)
WBC: 8.1 10*3/uL (ref 4.0–10.5)
WBC: 8.1 10*3/uL (ref 4.0–10.5)
WBC: 8.2 10*3/uL (ref 4.0–10.5)
nRBC: 0 % (ref 0.0–0.2)
nRBC: 0 % (ref 0.0–0.2)
nRBC: 0 % (ref 0.0–0.2)

## 2021-04-06 LAB — COMPREHENSIVE METABOLIC PANEL
ALT: 17 U/L (ref 0–44)
AST: 24 U/L (ref 15–41)
Albumin: 3 g/dL — ABNORMAL LOW (ref 3.5–5.0)
Alkaline Phosphatase: 48 U/L (ref 38–126)
Anion gap: 8 (ref 5–15)
BUN: 17 mg/dL (ref 8–23)
CO2: 23 mmol/L (ref 22–32)
Calcium: 8 mg/dL — ABNORMAL LOW (ref 8.9–10.3)
Chloride: 111 mmol/L (ref 98–111)
Creatinine, Ser: 0.96 mg/dL (ref 0.44–1.00)
GFR, Estimated: 59 mL/min — ABNORMAL LOW (ref >60)
Glucose, Bld: 105 mg/dL — ABNORMAL HIGH (ref 70–99)
Potassium: 3.8 mmol/L (ref 3.5–5.1)
Sodium: 142 mmol/L (ref 135–145)
Total Bilirubin: 1 mg/dL (ref 0.3–1.2)
Total Protein: 5.8 g/dL — ABNORMAL LOW (ref 6.5–8.1)

## 2021-04-06 LAB — PREPARE RBC (CROSSMATCH)

## 2021-04-06 LAB — D-DIMER, QUANTITATIVE: D-Dimer, Quant: 3.12 ug/mL-FEU — ABNORMAL HIGH (ref 0.00–0.50)

## 2021-04-06 LAB — GLUCOSE, CAPILLARY
Glucose-Capillary: 95 mg/dL (ref 70–99)
Glucose-Capillary: 110 mg/dL — ABNORMAL HIGH (ref 70–99)
Glucose-Capillary: 89 mg/dL (ref 70–99)
Glucose-Capillary: 104 mg/dL — ABNORMAL HIGH (ref 70–99)

## 2021-04-06 LAB — HEMOGLOBIN A1C
Hgb A1c MFr Bld: 6.4 % — ABNORMAL HIGH (ref 4.8–5.6)
Mean Plasma Glucose: 136.98 mg/dL

## 2021-04-06 LAB — MAGNESIUM: Magnesium: 1.5 mg/dL — ABNORMAL LOW (ref 1.7–2.4)

## 2021-04-06 LAB — ABO/RH: ABO/RH(D): O POS

## 2021-04-06 MED ORDER — AMLODIPINE BESYLATE 10 MG PO TABS
10.0000 mg | ORAL_TABLET | Freq: Every day | ORAL | Status: DC
  Administered 2021-04-06 – 2021-04-09 (×3): 10 mg via ORAL
  Filled 2021-04-06: qty 1
  Filled 2021-04-06 (×2): qty 2

## 2021-04-06 MED ORDER — DORZOLAMIDE HCL-TIMOLOL MAL 2-0.5 % OP SOLN
1.0000 [drp] | Freq: Two times a day (BID) | OPHTHALMIC | Status: DC
  Administered 2021-04-06 – 2021-04-09 (×7): 1 [drp] via OPHTHALMIC
  Filled 2021-04-06: qty 10

## 2021-04-06 MED ORDER — METOPROLOL TARTRATE 50 MG PO TABS
50.0000 mg | ORAL_TABLET | Freq: Every day | ORAL | Status: DC
  Administered 2021-04-06 – 2021-04-09 (×3): 50 mg via ORAL
  Filled 2021-04-06: qty 2
  Filled 2021-04-06: qty 1
  Filled 2021-04-06: qty 2

## 2021-04-06 MED ORDER — LEVOTHYROXINE SODIUM 75 MCG PO TABS
75.0000 ug | ORAL_TABLET | Freq: Every day | ORAL | Status: DC
  Administered 2021-04-07 – 2021-04-09 (×2): 75 ug via ORAL
  Filled 2021-04-06 (×3): qty 1

## 2021-04-06 MED ORDER — LACTATED RINGERS IV SOLN: INTRAVENOUS | Status: AC

## 2021-04-06 MED ORDER — ACETAMINOPHEN 650 MG RE SUPP: 650.0000 mg | Freq: Four times a day (QID) | RECTAL | Status: DC | PRN

## 2021-04-06 MED ORDER — LISINOPRIL 2.5 MG PO TABS
2.5000 mg | ORAL_TABLET | Freq: Every day | ORAL | Status: DC
  Administered 2021-04-06: 2.5 mg via ORAL
  Filled 2021-04-06: qty 1

## 2021-04-06 MED ORDER — ACETAMINOPHEN 325 MG PO TABS: 650.0000 mg | ORAL_TABLET | Freq: Four times a day (QID) | ORAL | Status: DC | PRN

## 2021-04-06 MED ORDER — ONDANSETRON HCL 4 MG/2ML IJ SOLN: 4.0000 mg | Freq: Four times a day (QID) | INTRAMUSCULAR | Status: DC | PRN

## 2021-04-06 MED ORDER — HYDRALAZINE HCL 20 MG/ML IJ SOLN
10.0000 mg | Freq: Four times a day (QID) | INTRAMUSCULAR | Status: DC | PRN
  Administered 2021-04-06: 10 mg via INTRAVENOUS
  Filled 2021-04-06: qty 1

## 2021-04-06 MED ORDER — ONDANSETRON HCL 4 MG PO TABS: 4.0000 mg | ORAL_TABLET | Freq: Four times a day (QID) | ORAL | Status: DC | PRN

## 2021-04-06 MED ORDER — MAGNESIUM SULFATE 2 GM/50ML IV SOLN
2.0000 g | Freq: Once | INTRAVENOUS | Status: AC
  Administered 2021-04-06: 2 g via INTRAVENOUS
  Filled 2021-04-06: qty 50

## 2021-04-06 MED ORDER — INSULIN ASPART 100 UNIT/ML ~~LOC~~ SOLN
0.0000 [IU] | Freq: Three times a day (TID) | SUBCUTANEOUS | Status: DC
  Administered 2021-04-07: 2 [IU] via SUBCUTANEOUS

## 2021-04-06 MED ORDER — PEG 3350-KCL-NA BICARB-NACL 420 G PO SOLR
4000.0000 mL | Freq: Once | ORAL | Status: AC
  Administered 2021-04-06: 4000 mL via ORAL

## 2021-04-06 MED ORDER — IOHEXOL 350 MG/ML SOLN
80.0000 mL | Freq: Once | INTRAVENOUS | Status: AC | PRN
  Administered 2021-04-06: 80 mL via INTRAVENOUS

## 2021-04-06 MED ORDER — POLYETHYLENE GLYCOL 3350 17 G PO PACK: 17.0000 g | PACK | Freq: Every day | ORAL | Status: DC | PRN

## 2021-04-06 NOTE — Consult Note (Signed)
[facility] Consult  Referring Provider: [facility] hospitalist Primary Care Physician:  [name], NP Primary Gastroenterologist: Dr. Barron Schmid GI  Reason for Consultation: Rectal bleeding  HPI: Alejandra Davis is a 85 y.o. female presented to [place] after several episodes of rectal bleeding at home. Patient felt the urge to have a bowel movement and upon sitting on the commode noticed that she was passing large amount of bright red and dark blood, she asked her son to bring her to the ER. She continued to have several episodes of painless rectal bleeding in the ER, was not hypotensive, but was tachycardic, subsequently had a rectal exam which showed dark red blood in rectal vault and external hemorrhoid. 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 I discussed the CAT scan findings with on-call radiologist Dr. Ruthann Cancer who subsequently recommended a nuclear bleeding scan. Patient was never hypotensive despite several episodes of hematochezia, however a change in her mental status was noted and subsequently she was given 2 units of PRBC emergently. After arriving to [facility], ER patient had a bleeding scan which did not show any definite acute GI bleeding. Since her presentation to [facility] patient has not had any further rectal bleeding which was confirmed with the patient as well as the night nurse. Although patient has remained hypertensive and required hydralazine, she has also remained tachycardic.  Last colonoscopy was performed in 2016 by Dr. Watt Climes, nonadenomatous polyp removed noted to have diverticulosis in sigmoid descending and ascending. Colonoscopy and endoscopy prior to that was in 2006.  She states she  lives at home by herself and is independent of most activities.  Normally she has a bowel movement on a daily basis and denies noticing blood in stool or black stools. She denies abdominal pain or unintentional weight loss, denies loss of appetite, early satiety or bloating. She denies acid reflux, heartburn, difficulty swallowing or pain on swallowing. Aspirin 81 mg is listed in her home medication, although patient does not think she is on an aspirin daily.   Past Medical History:  Diagnosis Date  . Chicken pox   . Cystocele   . Diabetes mellitus without complication ([place])   . Glaucoma   . Hypertension   . Hypertensive retinopathy    OU  . Neuromuscular disorder ([place])    Dementia   . Thyroid disease    hypothyroidism    Past Surgical History:  Procedure Laterality Date  . ABDOMINAL HYSTERECTOMY  1990   TAH BSO  . CATARACT EXTRACTION Bilateral    Dr. [name]  . EYE SURGERY Bilateral    Cat Sx OU  . OOPHORECTOMY     BSO  . Vaginal Bx     Papilloma    Prior to Admission medications   Medication Sig Start Date End Date Taking? Authorizing Provider  amLODipine (NORVASC) 10 MG tablet TAKE 1 TABLET(10 MG) BY MOUTH DAILY 12/11/20  Yes Dutch Quint B, FNP  dorzolamide-timolol (COSOPT) 22.3-6.8 MG/ML ophthalmic solution 1 drop 2 (two) times daily. 10/15/20  Yes [provider]  Lancets Ultra Thin 30G MISC 100 each by Does not apply route 3 (three) times a week. 09/18/20  Yes Ngetich, Dinah C, NP  levothyroxine (SYNTHROID) 75 MCG tablet Take 1 tablet (75 mcg total) by mouth daily. 11/13/20  Yes Ngetich, Buyer, retail  C, NP  lisinopril (ZESTRIL) 2.5 MG tablet Take 1 tablet (2.5 mg total) by mouth daily. 01/01/21  Yes Ngetich, Dinah C, NP  loratadine (CLARITIN) 10 MG tablet Take 1 tablet (10 mg total) by mouth daily. Patient taking differently: Take 10 mg by mouth daily as needed. 03/04/21  Yes Ngetich, Dinah C, NP  metFORMIN (GLUCOPHAGE) 500 MG tablet Take 1 tablet (500 mg total) by  mouth 2 (two) times daily with a meal. 03/25/21  Yes Ngetich, Dinah C, NP  metoprolol tartrate (LOPRESSOR) 50 MG tablet Take 1 tablet (50 mg total) by mouth daily. 01/01/21  Yes Ngetich, Dinah C, NP  aspirin EC 81 MG tablet Take 1 tablet (81 mg total) by mouth daily. 03/25/21   Ngetich, Nelda Bucks, NP    Current Facility-Administered Medications  Medication Dose Route Frequency Provider Last Rate Last Admin  . 0.9 %  sodium chloride infusion (Manually program via Guardrails IV Fluids)   Intravenous Once Shalhoub, Sherryll Burger, MD      . 0.9 %  sodium chloride infusion (Manually program via Guardrails IV Fluids)   Intravenous Once Shalhoub, Sherryll Burger, MD      . acetaminophen (TYLENOL) tablet 650 mg  650 mg Oral Q6H PRN Shalhoub, Sherryll Burger, MD       Or  . acetaminophen (TYLENOL) suppository 650 mg  650 mg Rectal Q6H PRN Shalhoub, Sherryll Burger, MD      . amLODipine (NORVASC) tablet 10 mg  10 mg Oral Daily Shalhoub, Sherryll Burger, MD      . dorzolamide-timolol (COSOPT) 22.3-6.8 MG/ML ophthalmic solution 1 drop  1 drop Both Eyes BID Shalhoub, Sherryll Burger, MD      . hydrALAZINE (APRESOLINE) injection 10 mg  10 mg Intravenous Q6H PRN Vernelle Emerald, MD   10 mg at 04/06/21 0436  . insulin aspart (novoLOG) injection 0-15 Units  0-15 Units Subcutaneous TID AC & HS Shalhoub, Sherryll Burger, MD      . iohexol (OMNIPAQUE) 350 MG/ML injection 80 mL  80 mL Intravenous Once PRN Shalhoub, Sherryll Burger, MD      . lactated ringers infusion   Intravenous Continuous Shalhoub, Sherryll Burger, MD 100 mL/hr at 04/06/21 0445 New Bag at 04/06/21 0445  . levothyroxine (SYNTHROID) tablet 75 mcg  75 mcg Oral Q0600 Vernelle Emerald, MD      . lisinopril (ZESTRIL) tablet 2.5 mg  2.5 mg Oral Daily Shalhoub, Sherryll Burger, MD      . magnesium sulfate IVPB 2 g 50 mL  2 g Intravenous Once Pokhrel, Laxman, MD      . metoprolol tartrate (LOPRESSOR) tablet 50 mg  50 mg Oral Daily Shalhoub, Sherryll Burger, MD      . ondansetron [facility]) tablet 4 mg  4 mg Oral Q6H PRN Shalhoub,  Sherryll Burger, MD       Or  . ondansetron (ZOFRAN) injection 4 mg  4 mg Intravenous Q6H PRN Shalhoub, Sherryll Burger, MD      . polyethylene glycol (MIRALAX / GLYCOLAX) packet 17 g  17 g Oral Daily PRN Shalhoub, Sherryll Burger, MD      . polyethylene glycol-electrolytes (NuLYTELY) solution 4,000 mL  4,000 mL Oral Once Ronnette Juniper, MD        Allergies as of 04/05/2021 - Review Complete 04/05/2021  Allergen Reaction Noted  . Donepezil hcl  11/15/2019    Family History  Problem Relation Age of Onset  . Sickle cell anemia Other   . Hypertension Mother   .  Cancer Father        LIVER  . Heart disease Brother   . Cancer Brother        STOMACH    Social History   Socioeconomic History  . Marital status: Divorced    Spouse name: Not on file  . Number of children: Not on file  . Years of education: Not on file  . Highest education level: Not on file  Occupational History  . Not on file  Tobacco Use  . Smoking status: Never Smoker  . Smokeless tobacco: Never Used  Vaping Use  . Vaping Use: Never used  Substance and Sexual Activity  . Alcohol use: No  . Drug use: No  . Sexual activity: Not on file  Other Topics Concern  . Not on file  Social History Narrative   Social History      Diet? n/a      Do you drink/eat things with caffeine? Yes   Marital status?      Divorced                               What year were you married? 1962      Do you live in a house, apartment, assisted living, condo, trailer, etc.? house      Is it one or more stories? One story      How many persons live in your home? 1      Do you have any pets in your home? (please list) none       Highest level of education completed? 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               sometimes                       Type & how often? Walking       Advanced Directives      Do you  have a living will? No       Do you have a DNR form?                                  If not, do you want to discuss one?  No       Do you have signed POA/HPOA for forms?       Functional Status      Do you have difficulty bathing or dressing yourself? No       Do you have difficulty preparing food or eating? No       Do you have difficulty managing your medications? No       Do you have difficulty managing your finances? No       Do you have difficulty affording your medications? No       Social Determinants of Radio broadcast assistant Strain: Not on file  Food Insecurity: Not on file  Transportation Needs: Not on file  Physical Activity: Not on file  Stress: Not on file  Social Connections: Not on file  Intimate Partner Violence: Not on file    Review of Systems: Positive for: GI: Described in detail in HPI.    Gen: Denies any fever,  chills, rigors, night sweats, anorexia, fatigue, weakness, malaise, involuntary weight loss, and sleep disorder CV: Denies chest pain, angina, palpitations, syncope, orthopnea, PND, peripheral edema, and claudication. Resp: Denies dyspnea, cough, sputum, wheezing, coughing up blood. GU : Denies urinary burning, blood in urine, urinary frequency, urinary hesitancy, nocturnal urination, and urinary incontinence. MS: Denies joint pain or swelling.  Denies muscle weakness, cramps, atrophy.  Derm: Denies rash, itching, oral ulcerations, hives, unhealing ulcers.  Psych: Denies depression, anxiety, memory loss, suicidal ideation, hallucinations,  and confusion. Heme: Denies bruising and enlarged lymph nodes. Neuro:  Denies any headaches, dizziness, paresthesias. Endo:  DM, thyroid,  denies any problems with adrenal function.  Physical Exam: Vital signs in last 24 hours: Temp:  [97.5 F (36.4 C)-98.4 F (36.9 C)] 98.4 F (36.9 C) (04/10 0600) Pulse Rate:  [101-142] 121 (04/10 0600) Resp:  [14-27] 14 (04/09 2230) BP: (124-182)/(72-119)  124/72 (04/10 0600) SpO2:  [98 %-100 %] 99 % (04/10 0600) Weight:  [52.2 kg-53.7 kg] 53.7 kg (04/10 0344) Last BM Date: 04/05/21  General:   Alert,  Well-developed, well-nourished, pleasant and cooperative in NAD Head:  Normocephalic and atraumatic. Eyes:  Sclera clear, no icterus.   Conjunctiva pink. Ears:  Normal auditory acuity. Nose:  No deformity, discharge,  or lesions. Mouth:  No deformity or lesions.  Oropharynx pink & moist. Neck:  Supple; no masses or thyromegaly. Lungs:  Clear throughout to auscultation.   No wheezes, crackles, or rhonchi. No acute distress. Heart:  Regular rate and rhythm; no murmurs, clicks, rubs,  or gallops. Extremities:  Without clubbing or edema. Neurologic:  Alert and  oriented x2(place and person);  grossly normal neurologically. Skin:  Intact without significant lesions or rashes. Psych:  Alert and cooperative. Normal mood and affect. Abdomen:  Soft, nontender and nondistended. No masses, hepatosplenomegaly or hernias noted. Normal bowel sounds, without guarding, and without rebound.         Lab Results: Recent Labs    04/05/21 1938 04/05/21 2230 04/06/21 0000  WBC 6.0 9.4 8.1  HGB 11.4* 13.2 12.3  HCT 35.0* 40.6 36.9  PLT 232 160 128*   BMET Recent Labs    04/05/21 1938 04/06/21 0518  NA 139 142  K 3.7 3.8  CL 104 111  CO2 27 23  GLUCOSE 122* 105*  BUN 20 17  CREATININE 1.31* 0.96  CALCIUM 8.8* 8.0*   LFT Recent Labs    04/06/21 0518  PROT 5.8*  ALBUMIN 3.0*  AST 24  ALT 17  ALKPHOS 48  BILITOT 1.0   PT/INR Recent Labs    04/05/21 2015  LABPROT 12.6  INR 1.0    Studies/Results: NM GI Blood Loss  Result Date: 04/06/2021 CLINICAL DATA:  GI bleed EXAM: NUCLEAR MEDICINE GASTROINTESTINAL BLEEDING SCAN TECHNIQUE: Sequential abdominal images were obtained following intravenous administration of Tc-107m labeled red blood cells. RADIOPHARMACEUTICALS:  22 mCi Tc-56m pertechnetate in-vitro labeled red cells. COMPARISON:   CT 04/05/2021 FINDINGS: There is excessive motion on multiple images. Imaging terminated slightly prematurely secondary to concerns over patient safety and motion. No definitive foci of tracer extravasation or static or dynamic foci of activity. IMPRESSION: Limited study secondary to excessive patient motion. No definite acute GI bleeding on current exam. Electronically Signed   By: Donavan Foil M.D.   On: 04/06/2021 03:55   CT Angio Abd/Pel W and/or Wo Contrast  Result Date: 04/05/2021 CLINICAL DATA:  GI bleed EXAM: CTA ABDOMEN AND PELVIS WITHOUT AND WITH CONTRAST TECHNIQUE: Multidetector CT imaging of the  abdomen and pelvis was performed using the standard protocol during bolus administration of intravenous contrast. Multiplanar reconstructed images and MIPs were obtained and reviewed to evaluate the vascular anatomy. CONTRAST:  6mL OMNIPAQUE IOHEXOL 350 MG/ML SOLN COMPARISON:  09/16/2019. FINDINGS: VASCULAR Aortic atherosclerosis. Redemonstrated chronic penetrating ulceration and/or dissection of the distal descending thoracic aorta (series 5, image 20). No aneurysm. Standard branching pattern of the abdominal aorta with solitary bilateral renal arteries. Review of the MIP images confirms the above findings. NON-VASCULAR Lower chest: No acute abnormality. Hepatobiliary: No focal liver abnormality is seen. No gallstones, gallbladder wall thickening, or biliary dilatation. Pancreas: Unremarkable. No pancreatic ductal dilatation or surrounding inflammatory changes. Spleen: Normal in size without focal abnormality. Adrenals/Urinary Tract: Adrenal glands are unremarkable. There are numerous tiny hypoenhancing lesions of the bilateral renal cortices. Thickening of the bladder. Large diverticulum of the posterior left aspect of the bladder. Stomach/Bowel: Stomach is within normal limits. There is a large volume of hyperdense fluid in the transverse colon and distally to the rectum. Sigmoid diverticulosis.  Lymphatic: No enlarged abdominal or pelvic lymph nodes. Reproductive: Status post hysterectomy. Other: No abdominal wall hernia or abnormality. No abdominopelvic ascites. Musculoskeletal: No acute or significant osseous findings. IMPRESSION: 1. Large volume of hyperdense fluid in the transverse colon distally to the rectum, presumably blood. No evidence of specific nidus of GI bleeding or intraluminal contrast extravasation. 2. Sigmoid diverticulosis without evidence of acute diverticulitis. 3. Numerous tiny hypoenhancing lesions of the bilateral renal cortices, suggesting embolic shower, of uncertain acuity. 4. Redemonstrated chronic penetrating ulceration or dissection of the distal descending thoracic aorta. No aneurysm. 5.  Aortic Atherosclerosis (ICD10-I70.0). 6. Thickening of the urinary bladder, suggestive of nonspecific infectious or inflammatory cystitis. Correlate with urinalysis. Electronically Signed   By: Eddie Candle M.D.   On: 04/05/2021 21:10    Impression: Painless hematochezia, no active bleeding noted on CT angiogram or nuclear bleeding scan. Hemoglobin 11.4 on presentation received 2 units PRBC and is 12.3 currently. Patient remains tachycardic but has a normal blood pressure. Sigmoid diverticulosis. Last colonoscopy in 2016.  Other comorbidities-age, diabetes, hypertension, dyslipidemia  Plan: I spoke with the patient as well as her daughter Dawnya Grams over the phone, [phone]. Discussed with them that rectal bleeding is probably related to diverticulosis. Colonoscopy may provide additional information, if patient is able to tolerate prep and is agreeable. Discussed the risks and the benefits of the procedure and anesthesia. Both the patient and her daughter want to move forward with a colonoscopy evaluation. We will keep the patient on clear liquid diet, n.p.o. post midnight and schedule her for a colonoscopy with Dr. Michail Sermon tomorrow morning.    LOS: 0 days   Ronnette Juniper, MD  04/06/2021, 7:53 AM

## 2021-04-06 NOTE — H&P (Signed)
History and Physical    Alejandra Davis:[id number] DOB: 14-Aug-1936 DOA: 04/05/2021  PCP: [name], NP  Patient coming from: [facility] ED   Chief Complaint:  Chief Complaint  Patient presents with  . Rectal Bleeding     HPI: 85 year old female with past medical history of hypertension, hyperlipidemia, hypothyroidism and late onset Alzheimer's dementia who presents to [facility] long hospital emergency department as a ED to ED transfer from [place] emergency department for gross rectal bleeding.  Patient is a poor historian due to presence of Alzheimer's dementia attempts have been made to call the daughter via phone number listed on the face sheet but there was no answer.  Majority of the history has been obtained from discussions with the emergency department staff, discussions with the patient and review of the ED record.  Earlier in the day on 4/9 patient suddenly began to experience rectal bleeding upon sitting on the commode.  Daughter reported to emergency department staff the patient several episodes of dark blood mixed with her stool.  Upon further questioning of the patient, she states that over the past several days she has felt generalized weakness, lightheadedness and poor appetite.  Patient denies any chest pain or frank shortness of breath.  Patient denies any associated abdominal pain.  Upon further questioning patient denies alcohol use and denies any use of NSAIDs in the outpatient setting.  While patient does have aspirin listed in her home medication reconciliation she denies any knowledge of this.  Due to several episodes of gross bleeding noted in the toilet, the daughter brought the patient into [place] emergency department for evaluation.  Upon evaluation at [facility] emergency department CT imaging of the abdomen and pelvis was performed with contrast.  This revealed a large volume of hyperdense fluid in the transverse colon  concerning for blood.  Patient was also found to have sigmoid diverticulosis without diverticulitis.   Case was then discussed with Dr. Therisa Doyne with gastroenterology by the emergency department staff.  They stated that they would evaluate the patient in the morning for potential endoscopic intervention.  They also recommended tagged red blood cell scan.  Patient was then transferred from [place] emergency department to [facility] long emergency department and the hospitalist group was then called to assess the patient for admission to the hospital.   Review of Systems:   Review of Systems  Unable to perform ROS: Mental acuity    Past Medical History:  Diagnosis Date  . Chicken pox   . Cystocele   . Diabetes mellitus without complication ([place])   . Glaucoma   . Hypertension   . Hypertensive retinopathy    OU  . Neuromuscular disorder ([place])    Dementia   . Thyroid disease    hypothyroidism    Past Surgical History:  Procedure Laterality Date  . ABDOMINAL HYSTERECTOMY  1990   TAH BSO  . CATARACT EXTRACTION Bilateral    Dr. [name]  . EYE SURGERY Bilateral    Cat Sx OU  . OOPHORECTOMY     BSO  . Vaginal Bx     Papilloma     reports that she has never smoked. She has never used smokeless tobacco. She reports that she does not drink alcohol and does not use drugs.  Allergies  Allergen Reactions  . Donepezil Hcl     Stomach cramps     Family History  Problem Relation Age of Onset  .  Sickle cell anemia Other   . Hypertension Mother   . Cancer Father        LIVER  . Heart disease Brother   . Cancer Brother        STOMACH     Prior to Admission medications   Medication Sig Start Date End Date Taking? Authorizing Provider  amLODipine (NORVASC) 10 MG tablet TAKE 1 TABLET(10 MG) BY MOUTH DAILY 12/11/20  Yes Dutch Quint B, FNP  dorzolamide-timolol (COSOPT) 22.3-6.8 MG/ML ophthalmic solution 1 drop 2 (two) times daily. 10/15/20  Yes [provider]   Lancets Ultra Thin 30G MISC 100 each by Does not apply route 3 (three) times a week. 09/18/20  Yes Ngetich, Dinah C, NP  levothyroxine (SYNTHROID) 75 MCG tablet Take 1 tablet (75 mcg total) by mouth daily. 11/13/20  Yes Ngetich, Dinah C, NP  lisinopril (ZESTRIL) 2.5 MG tablet Take 1 tablet (2.5 mg total) by mouth daily. 01/01/21  Yes Ngetich, Dinah C, NP  loratadine (CLARITIN) 10 MG tablet Take 1 tablet (10 mg total) by mouth daily. Patient taking differently: Take 10 mg by mouth daily as needed. 03/04/21  Yes Ngetich, Dinah C, NP  metFORMIN (GLUCOPHAGE) 500 MG tablet Take 1 tablet (500 mg total) by mouth 2 (two) times daily with a meal. 03/25/21  Yes Ngetich, Dinah C, NP  metoprolol tartrate (LOPRESSOR) 50 MG tablet Take 1 tablet (50 mg total) by mouth daily. 01/01/21  Yes Ngetich, Dinah C, NP  aspirin EC 81 MG tablet Take 1 tablet (81 mg total) by mouth daily. 03/25/21   Ngetich, Nelda Bucks, NP    Physical Exam: Vitals:   04/05/21 2230 04/06/21 0344 04/06/21 0458 04/06/21 0600  BP: (!) 156/94 (!) 182/119 140/83 124/72  Pulse: (!) 103 (!) 111  (!) 121  Resp: 14     Temp:  98.4 F (36.9 C)  98.4 F (36.9 C)  TempSrc:  Oral    SpO2: 99% 100%  99%  Weight:  53.7 kg    Height:  5\' 3"  (1.6 m)      Constitutional: Awake alert and oriented x1, no associated distress.   Skin: no rashes, no lesions, good skin turgor noted. Eyes: Pupils are equally reactive to light.  No evidence of scleral icterus or conjunctival pallor.  ENMT: Moist mucous membranes noted.  Posterior pharynx clear of any exudate or lesions.   Neck: normal, supple, no masses, no thyromegaly.  No evidence of jugular venous distension.   Respiratory: clear to auscultation bilaterally, no wheezing, no crackles. Normal respiratory effort. No accessory muscle use.  Cardiovascular: Regular rate and rhythm, no murmurs / rubs / gallops. No extremity edema. 2+ pedal pulses. No carotid bruits.  Chest:   Nontender without crepitus or deformity.    Back:   Nontender without crepitus or deformity. Abdomen: Notable lower abdominal tenderness.   Abdomen is soft and nontender.  No evidence of intra-abdominal masses.  Positive bowel sounds noted in all quadrants.   Musculoskeletal: No joint deformity upper and lower extremities. Good ROM, no contractures. Normal muscle tone.  Neurologic: Patient is awake and alert but only oriented x1.  CN 2-12 grossly intact. Sensation intact.  Patient moving all 4 extremities spontaneously.  Patient is following all commands.  Patient is responsive to verbal stimuli.   Psychiatric: Patient exhibits normal mood with appropriate affect.  Patient does not seem to possess insight as to her current situation.  Labs on Admission: I have personally reviewed following labs and imaging studies -  CBC: Recent Labs  Lab 04/05/21 1938 04/05/21 2230 04/06/21 0000  WBC 6.0 9.4 8.1  NEUTROABS 3.4  --   --   HGB 11.4* 13.2 12.3  HCT 35.0* 40.6 36.9  MCV 82.0 85.5 84.2  PLT 232 160 800*   Basic Metabolic Panel: Recent Labs  Lab 04/05/21 1938 04/06/21 0518  NA 139 142  K 3.7 3.8  CL 104 111  CO2 27 23  GLUCOSE 122* 105*  BUN 20 17  CREATININE 1.31* 0.96  CALCIUM 8.8* 8.0*  MG  --  1.5*   GFR: Estimated Creatinine Clearance: 37.4 mL/min (by C-G formula based on SCr of 0.96 mg/dL). Liver Function Tests: Recent Labs  Lab 04/05/21 1938 04/06/21 0518  AST 20 24  ALT 18 17  ALKPHOS 54 48  BILITOT 0.2* 1.0  PROT 6.6 5.8*  ALBUMIN 3.2* 3.0*   No results for input(s): LIPASE, AMYLASE in the last 168 hours. No results for input(s): AMMONIA in the last 168 hours. Coagulation Profile: Recent Labs  Lab 04/05/21 2015  INR 1.0   Cardiac Enzymes: No results for input(s): CKTOTAL, CKMB, CKMBINDEX, TROPONINI in the last 168 hours. BNP (last 3 results) No results for input(s): PROBNP in the last 8760 hours. HbA1C: Recent Labs    04/06/21 0000  HGBA1C 6.4*   CBG: Recent Labs  Lab 04/05/21 2146   GLUCAP 151*   Lipid Profile: No results for input(s): CHOL, HDL, LDLCALC, TRIG, CHOLHDL, LDLDIRECT in the last 72 hours. Thyroid Function Tests: No results for input(s): TSH, T4TOTAL, FREET4, T3FREE, THYROIDAB in the last 72 hours. Anemia Panel: No results for input(s): VITAMINB12, FOLATE, FERRITIN, TIBC, IRON, RETICCTPCT in the last 72 hours. Urine analysis:    Component Value Date/Time   COLORURINE YELLOW 09/16/2019 1354   APPEARANCEUR HAZY (A) 09/16/2019 1354   LABSPEC 1.021 09/16/2019 1354   PHURINE 6.0 09/16/2019 1354   GLUCOSEU NEGATIVE 09/16/2019 1354   GLUCOSEU NEGATIVE 03/13/2019 1345   HGBUR NEGATIVE 09/16/2019 1354   BILIRUBINUR Negative 01/01/2021 0946   KETONESUR 5 (A) 09/16/2019 1354   PROTEINUR Positive (A) 01/01/2021 0946   PROTEINUR 30 (A) 09/16/2019 1354   UROBILINOGEN negative (A) 01/01/2021 0946   UROBILINOGEN 0.2 03/13/2019 1345   NITRITE Negative 01/01/2021 0946   NITRITE NEGATIVE 09/16/2019 1354   LEUKOCYTESUR Large (3+) (A) 01/01/2021 0946   LEUKOCYTESUR MODERATE (A) 09/16/2019 1354    Radiological Exams on Admission - Personally Reviewed: NM GI Blood Loss  Result Date: 04/06/2021 CLINICAL DATA:  GI bleed EXAM: NUCLEAR MEDICINE GASTROINTESTINAL BLEEDING SCAN TECHNIQUE: Sequential abdominal images were obtained following intravenous administration of Tc-57m labeled red blood cells. RADIOPHARMACEUTICALS:  22 mCi Tc-5m pertechnetate in-vitro labeled red cells. COMPARISON:  CT 04/05/2021 FINDINGS: There is excessive motion on multiple images. Imaging terminated slightly prematurely secondary to concerns over patient safety and motion. No definitive foci of tracer extravasation or static or dynamic foci of activity. IMPRESSION: Limited study secondary to excessive patient motion. No definite acute GI bleeding on current exam. Electronically Signed   By: Donavan Foil M.D.   On: 04/06/2021 03:55   CT Angio Abd/Pel W and/or Wo Contrast  Result Date:  04/05/2021 CLINICAL DATA:  GI bleed EXAM: CTA ABDOMEN AND PELVIS WITHOUT AND WITH CONTRAST TECHNIQUE: Multidetector CT imaging of the abdomen and pelvis was performed using the standard protocol during bolus administration of intravenous contrast. Multiplanar reconstructed images and MIPs were obtained and reviewed to evaluate the vascular anatomy. CONTRAST:  50mL OMNIPAQUE IOHEXOL 350 MG/ML SOLN  COMPARISON:  09/16/2019. FINDINGS: VASCULAR Aortic atherosclerosis. Redemonstrated chronic penetrating ulceration and/or dissection of the distal descending thoracic aorta (series 5, image 20). No aneurysm. Standard branching pattern of the abdominal aorta with solitary bilateral renal arteries. Review of the MIP images confirms the above findings. NON-VASCULAR Lower chest: No acute abnormality. Hepatobiliary: No focal liver abnormality is seen. No gallstones, gallbladder wall thickening, or biliary dilatation. Pancreas: Unremarkable. No pancreatic ductal dilatation or surrounding inflammatory changes. Spleen: Normal in size without focal abnormality. Adrenals/Urinary Tract: Adrenal glands are unremarkable. There are numerous tiny hypoenhancing lesions of the bilateral renal cortices. Thickening of the bladder. Large diverticulum of the posterior left aspect of the bladder. Stomach/Bowel: Stomach is within normal limits. There is a large volume of hyperdense fluid in the transverse colon and distally to the rectum. Sigmoid diverticulosis. Lymphatic: No enlarged abdominal or pelvic lymph nodes. Reproductive: Status post hysterectomy. Other: No abdominal wall hernia or abnormality. No abdominopelvic ascites. Musculoskeletal: No acute or significant osseous findings. IMPRESSION: 1. Large volume of hyperdense fluid in the transverse colon distally to the rectum, presumably blood. No evidence of specific nidus of GI bleeding or intraluminal contrast extravasation. 2. Sigmoid diverticulosis without evidence of acute  diverticulitis. 3. Numerous tiny hypoenhancing lesions of the bilateral renal cortices, suggesting embolic shower, of uncertain acuity. 4. Redemonstrated chronic penetrating ulceration or dissection of the distal descending thoracic aorta. No aneurysm. 5.  Aortic Atherosclerosis (ICD10-I70.0). 6. Thickening of the urinary bladder, suggestive of nonspecific infectious or inflammatory cystitis. Correlate with urinalysis. Electronically Signed   By: Eddie Candle M.D.   On: 04/05/2021 21:10    EKG: Personally reviewed.  Rhythm is sinus tachycardia with heart rate of 140 bpm.  No dynamic ST segment changes appreciated.  Assessment/Plan Principal Problem:   Acute lower GI bleeding   Patient presenting with rather sudden onset of brisk lower gastrointestinal bleeding.    Patient has experienced several episodes of dark blood per rectum at home followed by approximately 5 more episodes in the emergency department at [facility] emergency department.  CT angiogram of the abdomen revealed high density fluid in the transverse colon consistent with blood  Patient has been brought here to [facility] and an urgent tagged red blood cell scan this evening has been performed at the request of gastroenterology  Tagged RBC scan does not reveal active bleed -meaning that the bleeding now is likely not fast enough to be picked up  Keeping patient n.p.o. in preparation for GI evaluation later this morning and possible endoscopic work-up  Diverticular bleed suspected  Holding home regimen of aspirin and holding other anticoagulants  Serial CBCs every 6 hours  will perform blood transfusion if hemoglobin drops below 7  Gentle intravenous hydration with isotonic fluids  Active Problems:   Embolic phenomenon   Incidental finding of numerous hypoenhancing lesions of the bilateral renal cortices concerning for embolic phenomenon   Clinically there is no physical evidence of sequela of infective  endocarditis.  EKG and telemetry up to this point revealed no evidence of atrial fibrillation  Obtaining D-dimer and if this is elevated will consider thromboembolic work-up  Obtaining echocardiogram  Monitoring patient on telemetry for thromboembolic arrhythmias such as atrial fibrillation   Essential hypertension   Continue home regimen of antihypertensives as blood pressure tolerates  As needed intravenous interventions for markedly elevated blood pressure    Controlled type 2 diabetes mellitus without complication, without long-term current use of insulin ([place])   Accu-Cheks before every meal and  nightly with slight scale insulin  Holding home regimen of metformin  Hemoglobin A1c pending    Acquired hypothyroidism   Continue home regimen of Synthroid  TSH performed earlier this year is at target    Chronic kidney disease, stage 3a ([place])  . Strict intake and output monitoring . Creatinine near baseline . Minimizing nephrotoxic agents as much as possible . Serial chemistries to monitor renal function and electrolytes    Late onset Alzheimer's dementia without behavioral disturbance ([place])   While patient is disoriented patient is pleasant and exhibits no evidence of behavioral disturbance  Minimize uncomfortable stimuli  Fall precautions  Nursing to redirect patient frequently    Sinus tachycardia  Possibly simply secondary to gastrointestinal bleeding but I am also concerned for the possibility of underlying tachyarrhythmias that may have precipitated the embolic phenomenon seen in the kidneys on CT abdomen  Hydrating patient with intravenous fluids  Monitoring on telemetry for evidence of tachyarrhythmias  Obtaining echocardiogram  Obtaining D-dimer  TSH performed earlier this year at target    Code Status:  Full code Family Communication: I have attempted to contact the daughter via the phone number listed on the face sheet but have been  unsuccessful  Status is: Observation  The patient remains OBS appropriate and will d/c before 2 midnights.  Dispo: The patient is from: Home              Anticipated d/c is to: Home              Patient currently is not medically stable to d/c.   Difficult to place patient No        Vernelle Emerald MD [facility] Pager [phone]  If 7PM-7AM, please contact night-coverage [email or web address] Use universal [place] password for that web site. If you do not have the password, please call the hospital operator.  04/06/2021, 7:01 AM

## 2021-04-06 NOTE — Progress Notes (Addendum)
PROGRESS NOTE  Alejandra Davis:[id number] DOB: 02/26/1936 DOA: 04/05/2021 PCP: [name], NP   LOS: 0 days   Brief narrative: 85 year old female with past medical history of hypertension, hyperlipidemia, hypothyroidism and late onset Alzheimer's dementia presented to the hospital with complaints of gross rectal bleeding.  Patient did have multiple episodes so was brought into the hospital.  Patient is a poor historian due to underlying dementia.  Patient was initially brought to [facility] emergency department where CT imaging of the abdomen and pelvis was performed with contrast.  This revealed a large volume of hyperdense fluid in the transverse colon concerning for blood.  Patient was also found to have sigmoid diverticulosis without diverticulitis.   GI was then consulted and patient was admitted to the hospital.  She also underwent tagged RBC scan.     Assessment/Plan:  Principal Problem:   Acute lower GI bleeding Active Problems:   Essential hypertension   Controlled type 2 diabetes mellitus without complication, without long-term current use of insulin (HCC)   Acquired hypothyroidism   Chronic kidney disease, stage 3a ([place])   Late onset Alzheimer's dementia without behavioral disturbance (HCC)   Sinus tachycardia  Acute lower GI bleeding Likely diverticular bleed.  5 or more episodes prior to presentation.  Phillip Heal with trace fluid in the transverse colon, tagged RBC scan did not reveal any active bleed.  Currently NPO.  GI on board.  Aspirin on hold.  Will transfuse if hemoglobin is less than 7.  Continue IV hydration.  Patient received 2 units of packed RBC.  Latest hemoglobin of 12.3.  Patient did have a colonoscopy back in 2016.  Continue IV fluids with Ringer lactate.  Currently on clears.  Will undergo colonoscopic evaluation.  Initial hemoglobin was 11.4.   Embolic phenomenon Incidental finding of numerous hypoenhancing lesions of the bilateral renal  cortices concerning for embolic phenomenon.  There is no signs and symptoms of infective endocarditis at this time.  EKG without A. fib.  D-dimer is significantly elevated.  Check 2D echocardiogram.  Continue to monitor.  P Phillip Heal was done due to elevated D-dimer.  No evidence of pulmonary embolism.  Penetrating focal ulceration over the left side of the descending thoracic aorta was reported.  Spoke with the vascular surgery Dr. [name] on the findings on the renal and the aorta. He stated no indication for anticoagulation and likely incidental finding. Will follow up as outpatient in one month.   Essential hypertension Continue amlodipine, hydralazine, metoprolol.  Monitor blood pressure.  Discontinue lisinopril.   Controlled type 2 diabetes mellitus without complication,  Continue sliding-scale insulin, Accu-Cheks, diabetic diet.  Hold Metformin.  Hemoglobin A1c of 6.4.  Hypomagnesemia.  Will replace through IV.  Check levels in a.m.    Acquired hypothyroidism Continue Synthroid.    Chronic kidney disease, stage 3a  Continue to monitor intake and output charting.  Received CT scan with IV contrast.  Will need to monitor renal function closely.    Late onset Alzheimer's dementia without behavioral disturbance  Continue supportive care.    Sinus tachycardia Still tachycardic.  Continue IV fluids.  Check 2D echocardiogram.  D-dimer is mildly elevated.  TSH recently was within the target range.  Will monitor in telemetry monitor to rule out other arrhythmias .  EKG unremarkable.  Check 2D echocardiogram  DVT prophylaxis: SCDs Start: 04/06/21 0028   Code Status: Full code  Family Communication:  Spoke with the with the patient's daughter on the phone and  updated her about the clinical condition of the patient.  Status is: Observation  The patient will require care spanning > 2 midnights and should be moved to inpatient because: IV treatments appropriate due to intensity of illness or  inability to take PO, Inpatient level of care appropriate due to severity of illness and Close monitoring ,possible need for endoscopic evaluation.  Dispo: The patient is from: Home              Anticipated d/c is to: Home              Patient currently is not medically stable to d/c.   Difficult to place patient No   Consultants:  GI  Procedures:  Transfusion of 2 units of packed RBC  Anti-infectives:  . None  Anti-infectives (From admission, onward)   None     Subjective: Today, patient was seen and examined at bedside.  Patient states that she did not have a bowel movement today.  No abdominal pain, nausea vomiting shortness of breath or chest pain.  Likely a poor historian.  Objective: Vitals:   04/06/21 0600 04/06/21 0851  BP: 124/72 (!) 168/92  Pulse: (!) 121 (!) 128  Resp:  16  Temp: 98.4 F (36.9 C) 98.5 F (36.9 C)  SpO2: 99% 100%    Intake/Output Summary (Last 24 hours) at 04/06/2021 1038 Last data filed at 04/06/2021 0550 Gross per 24 hour  Intake 500 ml  Output 300 ml  Net 200 ml   Filed Weights   04/05/21 1903 04/06/21 0344  Weight: 52.2 kg 53.7 kg   Body mass index is 20.97 kg/m.   Physical Exam:  GENERAL: Patient is alert awake and communicative, not in obvious distress.  Thinly built, underlying dementia, HENT: No scleral pallor or icterus. Pupils equally reactive to light. Oral mucosa is moist NECK: is supple, no gross swelling noted. CHEST: Clear to auscultation. No crackles or wheezes.  Diminished breath sounds bilaterally. CVS: S1 and S2 heard, no murmur. Regular rate and rhythm.  Mildly tachycardic.  ABDOMEN: Soft, non-tender, bowel sounds are present. EXTREMITIES: No edema. CNS: Cranial nerves are intact. No focal motor deficits. SKIN: warm and dry without rashes.  Data Review: I have personally reviewed the following laboratory data and studies,  CBC: Recent Labs  Lab 04/05/21 1938 04/05/21 2230 04/06/21 0000  WBC 6.0 9.4  8.1  NEUTROABS 3.4  --   --   HGB 11.4* 13.2 12.3  HCT 35.0* 40.6 36.9  MCV 82.0 85.5 84.2  PLT 232 160 242*   Basic Metabolic Panel: Recent Labs  Lab 04/05/21 1938 04/06/21 0518  NA 139 142  K 3.7 3.8  CL 104 111  CO2 27 23  GLUCOSE 122* 105*  BUN 20 17  CREATININE 1.31* 0.96  CALCIUM 8.8* 8.0*  MG  --  1.5*   Liver Function Tests: Recent Labs  Lab 04/05/21 1938 04/06/21 0518  AST 20 24  ALT 18 17  ALKPHOS 54 48  BILITOT 0.2* 1.0  PROT 6.6 5.8*  ALBUMIN 3.2* 3.0*   No results for input(s): LIPASE, AMYLASE in the last 168 hours. No results for input(s): AMMONIA in the last 168 hours. Cardiac Enzymes: No results for input(s): CKTOTAL, CKMB, CKMBINDEX, TROPONINI in the last 168 hours. BNP (last 3 results) No results for input(s): BNP in the last 8760 hours.  ProBNP (last 3 results) No results for input(s): PROBNP in the last 8760 hours.  CBG: Recent Labs  Lab 04/05/21 2146 04/06/21  0843  GLUCAP 151* 95   Recent Results (from the past 240 hour(s))  Resp Panel by RT-PCR (Flu A&B, Covid) Nasopharyngeal Swab     Status: None   Collection Time: 04/05/21  7:46 PM   Specimen: Nasopharyngeal Swab; Nasopharyngeal(NP) swabs in vial transport medium  Result Value Ref Range Status   SARS Coronavirus 2 by RT PCR NEGATIVE NEGATIVE Final    Comment: (NOTE) SARS-CoV-2 target nucleic acids are NOT DETECTED.  The SARS-CoV-2 RNA is generally detectable in upper respiratory specimens during the acute phase of infection. The lowest concentration of SARS-CoV-2 viral copies this assay can detect is 138 copies/mL. A negative result does not preclude SARS-Cov-2 infection and should not be used as the sole basis for treatment or other patient management decisions. A negative result may occur with  improper specimen collection/handling, submission of specimen other than nasopharyngeal swab, presence of viral mutation(s) within the areas targeted by this assay, and inadequate  number of viral copies(<138 copies/mL). A negative result must be combined with clinical observations, patient history, and epidemiological information. The expected result is Negative.  Fact Sheet for Patients:  EntrepreneurPulse.com.au  Fact Sheet for Healthcare Providers:  IncredibleEmployment.be  This test is no t yet approved or cleared by the [place] FDA and  has been authorized for detection and/or diagnosis of SARS-CoV-2 by FDA under an Emergency Use Authorization (EUA). This EUA will remain  in effect (meaning this test can be used) for the duration of the COVID-19 declaration under Section 564(b)(1) of the Act, 21 U.S.C.section 360bbb-3(b)(1), unless the authorization is terminated  or revoked sooner.       Influenza A by PCR NEGATIVE NEGATIVE Final   Influenza B by PCR NEGATIVE NEGATIVE Final    Comment: (NOTE) The Xpert Xpress SARS-CoV-2/FLU/RSV plus assay is intended as an aid in the diagnosis of influenza from Nasopharyngeal swab specimens and should not be used as a sole basis for treatment. Nasal washings and aspirates are unacceptable for Xpert Xpress SARS-CoV-2/FLU/RSV testing.  Fact Sheet for Patients: EntrepreneurPulse.com.au  Fact Sheet for Healthcare Providers: IncredibleEmployment.be  This test is not yet approved or cleared by the [place] FDA and has been authorized for detection and/or diagnosis of SARS-CoV-2 by FDA under an Emergency Use Authorization (EUA). This EUA will remain in effect (meaning this test can be used) for the duration of the COVID-19 declaration under Section 564(b)(1) of the Act, 21 U.S.C. section 360bbb-3(b)(1), unless the authorization is terminated or revoked.  Performed at [facility], [address] [place], [place] [address]      Studies: CT HEAD WO CONTRAST  Result Date: 04/06/2021 CLINICAL DATA:  Change in mental  status.  Hematochezia EXAM: CT HEAD WITHOUT CONTRAST TECHNIQUE: Contiguous axial images were obtained from the base of the skull through the vertex without intravenous contrast. COMPARISON:  Brain MRI 05/02/2019 FINDINGS: Brain: No evidence of acute infarction, hemorrhage, hydrocephalus, extra-axial collection or mass lesion/mass effect. Cerebral and especially cerebellar atrophy. Chronic small vessel ischemia throughout the cerebral white matter. Chronic lacunar infarct at the right pons. Vascular: No hyperdense vessel or unexpected calcification. Skull: Normal. Negative for fracture or focal lesion. Sinuses/Orbits: No acute finding. IMPRESSION: No acute finding. Atrophy and extensive chronic small vessel ischemia. Electronically Signed   By: Monte Fantasia M.D.   On: 04/06/2021 08:58   CT ANGIO CHEST PE W OR WO CONTRAST  Result Date: 04/06/2021 CLINICAL DATA:  Chest pain and altered mental status. EXAM: CT ANGIOGRAPHY CHEST WITH  CONTRAST TECHNIQUE: Multidetector CT imaging of the chest was performed using the standard protocol during bolus administration of intravenous contrast. Multiplanar CT image reconstructions and MIPs were obtained to evaluate the vascular anatomy. CONTRAST:  40mL OMNIPAQUE IOHEXOL 350 MG/ML SOLN COMPARISON:  CT angiogram abdomen and pelvis including lower lung regions April 05, 2021 FINDINGS: There are scattered foci calcification in visualized great vessels. There are multiple foci of aortic atherosclerosis. There is an area of apparent plaque ulceration along the leftward aspect of the distal aorta near the gastroesophageal junction. The focus of plaque ulceration measures 1.8 x 0.7 cm. There is less than 50% diameter narrowing of the distal descending thoracic aorta in this area. No other plaque ulceration evident on this study. No pericardial effusion or pericardial thickening. There are foci of aortic atherosclerosis. There are foci of coronary artery calcification.  Mediastinum/Nodes: Thyroid appears unremarkable. No evident thoracic adenopathy. No appreciable esophageal lesions. Lungs/Pleura: There is scarring in the extreme lung apices. There is mild bibasilar atelectasis. There is no edema or airspace opacity. No pleural effusions. No pneumothorax. Trachea and major bronchial structures appear patent. Upper Abdomen: There are gallstones within the gallbladder. Contrast is seen within the gallbladder from recent CT angiogram abdomen and pelvis. Cyst arising from left kidney laterally measuring 1.8 x 1.7 cm noted. Musculoskeletal: No blastic or lytic bone lesions. Degenerative change noted in thoracic spine. There are no chest wall lesions. Review of the MIP images confirms the above findings. IMPRESSION: 1. Penetrating focal ulceration along the leftward aspect of the descending thoracic aorta near the gastroesophageal junction level, stable. No similar changes elsewhere. No frank dissection. Moderate atherosclerotic plaque in this region of ulceration noted. No thoracic aortic aneurysm. 2.  No evident pulmonary embolus. 3. Bibasilar atelectasis. No edema or airspace opacity. No pleural effusions. 4.  Cholelithiasis. 5. There are foci of great vessel and coronary artery calcification. Aortic Atherosclerosis (ICD10-I70.0). Electronically Signed   By: Lowella Grip III M.D.   On: 04/06/2021 09:21   NM GI Blood Loss  Result Date: 04/06/2021 CLINICAL DATA:  GI bleed EXAM: NUCLEAR MEDICINE GASTROINTESTINAL BLEEDING SCAN TECHNIQUE: Sequential abdominal images were obtained following intravenous administration of Tc-46m labeled red blood cells. RADIOPHARMACEUTICALS:  22 mCi Tc-63m pertechnetate in-vitro labeled red cells. COMPARISON:  CT 04/05/2021 FINDINGS: There is excessive motion on multiple images. Imaging terminated slightly prematurely secondary to concerns over patient safety and motion. No definitive foci of tracer extravasation or static or dynamic foci of  activity. IMPRESSION: Limited study secondary to excessive patient motion. No definite acute GI bleeding on current exam. Electronically Signed   By: Donavan Foil M.D.   On: 04/06/2021 03:55   ECHOCARDIOGRAM COMPLETE  Result Date: 04/06/2021    ECHOCARDIOGRAM REPORT   Patient Name:   Alejandra Davis Date of Exam: 04/06/2021 Medical Rec #:  [id number]     Height:       63.0 in Accession #:    [id number]    Weight:       118.4 lb Date of Birth:  1936-12-07    BSA:          1.547 m Patient Age:    14 years      BP:           124/72 mmHg Patient Gender: F             HR:           112 bpm. Exam Location:  Inpatient Procedure: 2D Echo, 3D Echo, Cardiac  Doppler, Color Doppler and Strain Analysis Indications:    Endocarditis.  History:        Patient has prior history of Echocardiogram examinations, most                 recent 09/30/2018. Abnormal ECG and Tachycardia,                 Signs/Symptoms:Murmur and Alzheimer's; Risk Factors:Diabetes and                 Hypertension.  Sonographer:    Roseanna Rainbow RDCS Referring Phys: [id number] Sherryll Burger [place]  Sonographer Comments: Global longitudinal strain was attempted. IMPRESSIONS  1. Left ventricular ejection fraction, by estimation, is 60 to 65%. The left ventricle has normal function. The left ventricle has no regional wall motion abnormalities. Left ventricular diastolic parameters are indeterminate. The average left ventricular global longitudinal strain is -15.9 %. The global longitudinal strain is normal.  2. Right ventricular systolic function is normal. The right ventricular size is normal. There is normal pulmonary artery systolic pressure.  3. A small pericardial effusion is present. The pericardial effusion is posterior to the left ventricle.  4. The mitral valve is degenerative. Trivial mitral valve regurgitation. No evidence of mitral stenosis. Moderate mitral annular calcification.  5. Significant AV sclerosis particularly the non coronary cusp without stenosis .  The aortic valve is calcified. There is severe calcifcation of the aortic valve. There is severe thickening of the aortic valve. Aortic valve regurgitation is trivial. Mild to moderate aortic valve sclerosis/calcification is present, without any evidence of aortic stenosis.  6. The inferior vena cava is normal in size with greater than 50% respiratory variability, suggesting right atrial pressure of 3 mmHg. FINDINGS  Left Ventricle: Left ventricular ejection fraction, by estimation, is 60 to 65%. The left ventricle has normal function. The left ventricle has no regional wall motion abnormalities. The average left ventricular global longitudinal strain is -15.9 %. The global longitudinal strain is normal. 3D left ventricular ejection fraction analysis performed but not reported based on interpreter judgement due to suboptimal quality. The left ventricular internal cavity size was normal in size. There is no left ventricular hypertrophy. Left ventricular diastolic parameters are indeterminate. Right Ventricle: The right ventricular size is normal. No increase in right ventricular wall thickness. Right ventricular systolic function is normal. There is normal pulmonary artery systolic pressure. The tricuspid regurgitant velocity is 2.02 m/s, and  with an assumed right atrial pressure of 3 mmHg, the estimated right ventricular systolic pressure is 08.6 mmHg. Left Atrium: Left atrial size was normal in size. Right Atrium: Right atrial size was normal in size. Pericardium: A small pericardial effusion is present. The pericardial effusion is posterior to the left ventricle. Mitral Valve: The mitral valve is degenerative in appearance. There is moderate thickening of the mitral valve leaflet(s). There is moderate calcification of the mitral valve leaflet(s). Moderate mitral annular calcification. Trivial mitral valve regurgitation. No evidence of mitral valve stenosis. Tricuspid Valve: The tricuspid valve is normal in  structure. Tricuspid valve regurgitation is mild . No evidence of tricuspid stenosis. Aortic Valve: Significant AV sclerosis particularly the non coronary cusp without stenosis. The aortic valve is calcified. There is severe calcifcation of the aortic valve. There is severe thickening of the aortic valve. There is severe aortic valve annular calcification. Aortic valve regurgitation is trivial. Mild to moderate aortic valve sclerosis/calcification is present, without any evidence of aortic stenosis. Aortic valve mean gradient measures 7.0 mmHg. Aortic valve  peak gradient measures 12.2 mmHg. Aortic valve area, by VTI measures 2.24 cm. Pulmonic Valve: The pulmonic valve was normal in structure. Pulmonic valve regurgitation is not visualized. No evidence of pulmonic stenosis. Aorta: The aortic root is normal in size and structure. Venous: The inferior vena cava is normal in size with greater than 50% respiratory variability, suggesting right atrial pressure of 3 mmHg. IAS/Shunts: No atrial level shunt detected by color flow Doppler.  LEFT VENTRICLE PLAX 2D LVIDd:         2.40 cm     Diastology LVIDs:         1.40 cm     LV e' medial:   7.83 cm/s LV PW:         1.80 cm     LV E/e' medial: 14.4 LV IVS:        1.80 cm LVOT diam:     2.05 cm     2D Longitudinal Strain LV SV:         72          2D Strain GLS Avg:     -15.9 % LV SV Index:   46 LVOT Area:     3.30 cm  LV Volumes (MOD) LV vol d, MOD A2C: 49.0 ml LV vol d, MOD A4C: 32.0 ml LV vol s, MOD A2C: 17.7 ml LV vol s, MOD A4C: 10.8 ml LV SV MOD A2C:     31.3 ml LV SV MOD A4C:     32.0 ml LV SV MOD BP:      25.3 ml RIGHT VENTRICLE             IVC RV S prime:     11.30 cm/s  IVC diam: 1.00 cm TAPSE (M-mode): 2.1 cm LEFT ATRIUM             Index       RIGHT ATRIUM           Index LA diam:        2.60 cm 1.68 cm/m  RA Area:     10.80 cm LA Vol (A2C):   20.0 ml 12.93 ml/m RA Volume:   18.80 ml  12.15 ml/m LA Vol (A4C):   21.8 ml 14.09 ml/m LA Biplane Vol: 21.6 ml  13.96 ml/m  AORTIC VALVE AV Area (Vmax):    2.47 cm AV Area (Vmean):   2.13 cm AV Area (VTI):     2.24 cm AV Vmax:           175.00 cm/s AV Vmean:          131.000 cm/s AV VTI:            0.320 m AV Peak Grad:      12.2 mmHg AV Mean Grad:      7.0 mmHg LVOT Vmax:         131.00 cm/s LVOT Vmean:        84.600 cm/s LVOT VTI:          0.217 m LVOT/AV VTI ratio: 0.68  AORTA Ao Root diam: 3.30 cm Ao Asc diam:  3.30 cm MITRAL VALVE                TRICUSPID VALVE MV Area (PHT): 6.32 cm     TR Peak grad:   16.3 mmHg MV Decel Time: 120 msec     TR Vmax:        202.00 cm/s MV E velocity: 113.00 cm/s  SHUNTS                             Systemic VTI:  0.22 m                             Systemic Diam: 2.05 cm Jenkins Rouge MD Electronically signed by Jenkins Rouge MD Signature Date/Time: 04/06/2021/10:23:50 AM    Final    CT Angio Abd/Pel W and/or Wo Contrast  Result Date: 04/05/2021 CLINICAL DATA:  GI bleed EXAM: CTA ABDOMEN AND PELVIS WITHOUT AND WITH CONTRAST TECHNIQUE: Multidetector CT imaging of the abdomen and pelvis was performed using the standard protocol during bolus administration of intravenous contrast. Multiplanar reconstructed images and MIPs were obtained and reviewed to evaluate the vascular anatomy. CONTRAST:  91mL OMNIPAQUE IOHEXOL 350 MG/ML SOLN COMPARISON:  09/16/2019. FINDINGS: VASCULAR Aortic atherosclerosis. Redemonstrated chronic penetrating ulceration and/or dissection of the distal descending thoracic aorta (series 5, image 20). No aneurysm. Standard branching pattern of the abdominal aorta with solitary bilateral renal arteries. Review of the MIP images confirms the above findings. NON-VASCULAR Lower chest: No acute abnormality. Hepatobiliary: No focal liver abnormality is seen. No gallstones, gallbladder wall thickening, or biliary dilatation. Pancreas: Unremarkable. No pancreatic ductal dilatation or surrounding inflammatory changes. Spleen: Normal in size without  focal abnormality. Adrenals/Urinary Tract: Adrenal glands are unremarkable. There are numerous tiny hypoenhancing lesions of the bilateral renal cortices. Thickening of the bladder. Large diverticulum of the posterior left aspect of the bladder. Stomach/Bowel: Stomach is within normal limits. There is a large volume of hyperdense fluid in the transverse colon and distally to the rectum. Sigmoid diverticulosis. Lymphatic: No enlarged abdominal or pelvic lymph nodes. Reproductive: Status post hysterectomy. Other: No abdominal wall hernia or abnormality. No abdominopelvic ascites. Musculoskeletal: No acute or significant osseous findings. IMPRESSION: 1. Large volume of hyperdense fluid in the transverse colon distally to the rectum, presumably blood. No evidence of specific nidus of GI bleeding or intraluminal contrast extravasation. 2. Sigmoid diverticulosis without evidence of acute diverticulitis. 3. Numerous tiny hypoenhancing lesions of the bilateral renal cortices, suggesting embolic shower, of uncertain acuity. 4. Redemonstrated chronic penetrating ulceration or dissection of the distal descending thoracic aorta. No aneurysm. 5.  Aortic Atherosclerosis (ICD10-I70.0). 6. Thickening of the urinary bladder, suggestive of nonspecific infectious or inflammatory cystitis. Correlate with urinalysis. Electronically Signed   By: Eddie Candle M.D.   On: 04/05/2021 21:10     Flora Lipps, MD  [facility] 04/06/2021  If 7PM-7AM, please contact night-coverage

## 2021-04-06 NOTE — Progress Notes (Signed)
  Echocardiogram 2D Echocardiogram has been performed.  Alejandra Davis 04/06/2021, 10:20 AM

## 2021-04-07 LAB — TYPE AND SCREEN
ABO/RH(D): O POS
Antibody Screen: NEGATIVE
Unit division: 0
Unit division: 0

## 2021-04-07 LAB — BASIC METABOLIC PANEL
Anion gap: 7 (ref 5–15)
BUN: 11 mg/dL (ref 8–23)
CO2: 23 mmol/L (ref 22–32)
Calcium: 8.4 mg/dL — ABNORMAL LOW (ref 8.9–10.3)
Chloride: 108 mmol/L (ref 98–111)
Creatinine, Ser: 0.9 mg/dL (ref 0.44–1.00)
GFR, Estimated: >60 mL/min (ref >60)
Glucose, Bld: 86 mg/dL (ref 70–99)
Potassium: 4.1 mmol/L (ref 3.5–5.1)
Sodium: 138 mmol/L (ref 135–145)

## 2021-04-07 LAB — GLUCOSE, CAPILLARY
Glucose-Capillary: 86 mg/dL (ref 70–99)
Glucose-Capillary: 127 mg/dL — ABNORMAL HIGH (ref 70–99)
Glucose-Capillary: 53 mg/dL — ABNORMAL LOW (ref 70–99)
Glucose-Capillary: 81 mg/dL (ref 70–99)
Glucose-Capillary: 90 mg/dL (ref 70–99)

## 2021-04-07 LAB — BPAM RBC
Blood Product Expiration Date: 202204272359
ISSUE DATE / TIME: 202204092121
Unit Type and Rh: 9500

## 2021-04-07 LAB — PHOSPHORUS: Phosphorus: 2.8 mg/dL (ref 2.5–4.6)

## 2021-04-07 LAB — MAGNESIUM: Magnesium: 1.8 mg/dL (ref 1.7–2.4)

## 2021-04-07 MED ORDER — ZOLPIDEM TARTRATE 5 MG PO TABS
5.0000 mg | ORAL_TABLET | Freq: Every evening | ORAL | Status: DC | PRN
  Administered 2021-04-07: 5 mg via ORAL
  Filled 2021-04-07: qty 1

## 2021-04-07 NOTE — H&P (View-Only) (Signed)
[facility] Progress Note  Alejandra Davis 85 y.o. September 21, 1936  CC:  Hematochezia   Subjective: Patient is pleasantly confused.  Denies any complaints.  Per RN, pt had a formed brown BM today.  She has only consumed 1/4 bowel prep.  ROS : Review of Systems  Unable to perform ROS: Dementia   Objective: Vital signs in last 24 hours: Vitals:   04/06/21 2100 04/07/21 0441  BP: (!) 158/100 (!) 144/83  Pulse: (!) 109 83  Resp:  20  Temp: 98.2 F (36.8 C) 98 F (36.7 C)  SpO2: 100% 99%    Physical Exam:  General:  Alert, pleasantly confused, cooperative, no distress  Head:  Normocephalic, without obvious abnormality, atraumatic  Eyes:  Anicteric sclera, EOMs intact  Lungs:   Clear to auscultation bilaterally, respirations unlabored  Heart:  Regular rate and rhythm, S1, S2 normal  Abdomen:   Soft, non-tender, non-distended, bowel sounds active all four quadrants  Extremities: Extremities normal, atraumatic, no  edema  Pulses: 2+ and symmetric    Lab Results: Recent Labs    04/06/21 0518 04/07/21 0515  NA 142 138  K 3.8 4.1  CL 111 108  CO2 23 23  GLUCOSE 105* 86  BUN 17 11  CREATININE 0.96 0.90  CALCIUM 8.0* 8.4*  MG 1.5* 1.8  PHOS  --  2.8   Recent Labs    04/05/21 1938 04/06/21 0518  AST 20 24  ALT 18 17  ALKPHOS 54 48  BILITOT 0.2* 1.0  PROT 6.6 5.8*  ALBUMIN 3.2* 3.0*   Recent Labs    04/05/21 1938 04/05/21 2230 04/06/21 1037 04/06/21 1615  WBC 6.0   < > 8.2 8.1  NEUTROABS 3.4  --   --   --   HGB 11.4*   < > 12.4 11.6*  HCT 35.0*   < > 37.6 34.6*  MCV 82.0   < > 84.3 83.4  PLT 232   < > 169 169   < > = values in this interval not displayed.   Recent Labs    04/05/21 2015  LABPROT 12.6  INR 1.0   Assessment: Painless hematochezia, no active bleeding noted on CT angiogram or nuclear bleeding scan.  Suspect diverticular origin. Hemoglobin 11.4 on presentation received 2 units PRBC and was 12.4 as of yesterday, stable.  No signs  of ongoing bleeding  Plan: Colonoscopy tomorrow if patient is able to complete prep.  Clear liquid diet, NPO after midnight.  Continue to monitor H&H with transfusion as needed to maintain Hgb >7.  [facility] will follow.  Salley Slaughter PA-C 04/07/2021, 10:20 AM  Contact #  [phone]

## 2021-04-07 NOTE — Plan of Care (Signed)

## 2021-04-07 NOTE — Progress Notes (Signed)
Chaplain engaged in an initial visit with Alejandra Davis.  During visit, Alejandra Davis shared her faith and her love of her granddaughter, Alejandra Davis.  Alejandra Davis shared that her granddaughter deals with sickle cell but has such a positive faith-driven attitude.  She voiced that when Alejandra Davis was just 85 years old she declared that she was going to be ok concerning her illness.  Alejandra Davis has been enamored by [employer] and strength in life.  It has also influenced her.    Alejandra Davis described God as the head of her life and having the final say in what's happening in her life.  Chaplain affirmed Alejandra Davis's role in her life and how that has coincided with her faith/beliefs.  Chaplain offered prayer over Alejandra Davis.  She was happy that chaplain stopped by.  Chaplain is available to follow-up.    04/07/21 1200  Clinical Encounter Type  Visited With Patient  Visit Type Initial;Spiritual support

## 2021-04-07 NOTE — Progress Notes (Signed)
PROGRESS NOTE  Alejandra Davis HGD:[id number] DOB: 1936-08-08 DOA: 04/05/2021 PCP: [name], NP   LOS: 1 day   Brief narrative: 85 year old female with past medical history of hypertension, hyperlipidemia, hypothyroidism and late onset Alzheimer's dementia presented to the hospital with complaints of gross rectal bleeding.  Patient did have multiple episodes so was brought into the hospital.  Patient is a poor historian due to underlying dementia.  Patient was initially brought to [facility] emergency department where CT imaging of the abdomen and pelvis was performed with contrast.  This revealed a large volume of hyperdense fluid in the transverse colon concerning for blood.  Patient was also found to have sigmoid diverticulosis without diverticulitis.   GI was then consulted and patient was admitted to the hospital.  She also underwent tagged RBC scan.  Patient was then considered for colonoscopy and was started on colon preparation.   Assessment/Plan:  Principal Problem:   Acute lower GI bleeding Active Problems:   Essential hypertension   Controlled type 2 diabetes mellitus without complication, without long-term current use of insulin (HCC)   Acquired hypothyroidism   Chronic kidney disease, stage 3a ([place])   Late onset Alzheimer's dementia without behavioral disturbance (HCC)   Sinus tachycardia   GI bleed  Acute lower GI bleeding, painless hematochezia Likely diverticular bleed.  5 or more episodes prior to presentation.  CTA of the abdomen and pelvis with trace fluid in the transverse colon, tagged RBC scan did not reveal any active bleed.   Aspirin on hold.  Will transfuse if hemoglobin is less than 7.  Patient received 2 units of packed RBC initially.  No further bleeds.  Latest hemoglobin of 11.6.  Patient did have a colonoscopy back in 2016.   Currently on clears.  Awaiting for colonoscopic evaluation after adequate bowel preparation.  Suspected Embolic  phenomenon Incidental finding of numerous hypoenhancing lesions of the bilateral renal cortices concerning for embolic phenomenon.  There is no signs and symptoms of infective endocarditis at this time.  EKG without A. fib.  D-dimer is significantly elevated.  2D echocardiogram showed preserved LV function at 60 to 65% with normal right ventricular systolic function no mention of vegetation. CTA of the chest  was done due to elevated D-dimer.  No evidence of pulmonary embolism.  Penetrating focal ulceration over the left side of the descending thoracic aorta was reported.  Spoke with the vascular surgery Dr. [name] on the findings on the renal and the aorta. He stated no indication for anticoagulation and likely incidental finding.  Vascular surgery plans to follow up as outpatient in one month.   Essential hypertension Continue amlodipine, hydralazine, metoprolol.  Monitor blood pressure.  Currently off lisinopril.  Will resume lisinopril starting tomorrow.   Controlled type 2 diabetes mellitus without complication,  Continue sliding-scale insulin, Accu-Cheks, diabetic diet.  Hold Metformin.  Hemoglobin A1c of 6.4.  Latest POC glucose of 127  Hypomagnesemia.  After replacement.  Latest magnesium of 1.8    Acquired hypothyroidism Continue Synthroid.    Chronic kidney disease, stage 3a  Continue to monitor intake and output charting.  Received CT scan with IV contrast.  Will need to monitor renal function closely.  Resume lisinopril from tomorrow.    Late onset Alzheimer's dementia without behavioral disturbance  Continue supportive care.    Sinus tachycardia Improved.  2D echocardiogram reviewed.  CTA chest without PE.   DVT prophylaxis: SCDs Start: 04/06/21 0028   Code Status: Full code  Family Communication:  None today.  Spoke with the patient's daughter on the phone yesterday   Status is: Inpatient  The patient is inpatient because: IV treatments appropriate due to intensity  of illness or inability to take PO, Inpatient level of care appropriate due to severity of illness and Close monitoring of GI bleed, plan for colonoscopy evaluation.  Dispo: The patient is from: Home              Anticipated d/c is to: Home in 1 to 2 days              Patient currently is not medically stable to d/c.   Difficult to place patient No   Consultants:  GI  Procedures:  Transfusion of 2 units of packed RBC  Anti-infectives:  . None  Anti-infectives (From admission, onward)   None     Subjective: Today, patient was seen and examined at bedside.  Patient was started on bowel preparation but has not been drinking much.  Encouraged to drink.  No mention of further bleeding.  Patient is poor historian due to underlying dementia.  Objective: Vitals:   04/06/21 2100 04/07/21 0441  BP: (!) 158/100 (!) 144/83  Pulse: (!) 109 83  Resp:  20  Temp: 98.2 F (36.8 C) 98 F (36.7 C)  SpO2: 100% 99%    Intake/Output Summary (Last 24 hours) at 04/07/2021 1214 Last data filed at 04/07/2021 1000 Gross per 24 hour  Intake 2025.12 ml  Output --  Net 2025.12 ml   Filed Weights   05-03-21 1903 04/06/21 0344  Weight: 52.2 kg 53.7 kg   Body mass index is 20.97 kg/m.   Physical Exam: GENERAL: Patient is alert awake and communicative, not in obvious distress.  Thinly built, underlying dementia, oriented to self. HENT: No scleral pallor or icterus. Pupils equally reactive to light. Oral mucosa is moist NECK: is supple, no gross swelling noted. CHEST: Clear to auscultation. No crackles or wheezes.  Diminished breath sounds bilaterally. CVS: S1 and S2 heard, no murmur. Regular rate and rhythm.    ABDOMEN: Soft, non-tender, bowel sounds are present. EXTREMITIES: No edema. CNS: Cranial nerves are intact. No focal motor deficits. SKIN: warm and dry without rashes.  Data Review: I have personally reviewed the following laboratory data and studies,  CBC: Recent Labs  Lab  2021-05-03 1938 2021/05/03 2230 04/06/21 0000 04/06/21 1037 04/06/21 1615  WBC 6.0 9.4 8.1 8.2 8.1  NEUTROABS 3.4  --   --   --   --   HGB 11.4* 13.2 12.3 12.4 11.6*  HCT 35.0* 40.6 36.9 37.6 34.6*  MCV 82.0 85.5 84.2 84.3 83.4  PLT 232 160 128* 169 546   Basic Metabolic Panel: Recent Labs  Lab 05/03/21 1938 04/06/21 0518 04/07/21 0515  NA 139 142 138  K 3.7 3.8 4.1  CL 104 111 108  CO2 27 23 23   GLUCOSE 122* 105* 86  BUN 20 17 11   CREATININE 1.31* 0.96 0.90  CALCIUM 8.8* 8.0* 8.4*  MG  --  1.5* 1.8  PHOS  --   --  2.8   Liver Function Tests: Recent Labs  Lab May 03, 2021 1938 04/06/21 0518  AST 20 24  ALT 18 17  ALKPHOS 54 48  BILITOT 0.2* 1.0  PROT 6.6 5.8*  ALBUMIN 3.2* 3.0*   No results for input(s): LIPASE, AMYLASE in the last 168 hours. No results for input(s): AMMONIA in the last 168 hours. Cardiac Enzymes: No results for input(s): CKTOTAL,  CKMB, CKMBINDEX, TROPONINI in the last 168 hours. BNP (last 3 results) No results for input(s): BNP in the last 8760 hours.  ProBNP (last 3 results) No results for input(s): PROBNP in the last 8760 hours.  CBG: Recent Labs  Lab 04/06/21 1108 04/06/21 1651 04/06/21 2154 04/07/21 0747 04/07/21 1209  GLUCAP 110* 89 104* 86 127*   Recent Results (from the past 240 hour(s))  Resp Panel by RT-PCR (Flu A&B, Covid) Nasopharyngeal Swab     Status: None   Collection Time: 04/05/21  7:46 PM   Specimen: Nasopharyngeal Swab; Nasopharyngeal(NP) swabs in vial transport medium  Result Value Ref Range Status   SARS Coronavirus 2 by RT PCR NEGATIVE NEGATIVE Final    Comment: (NOTE) SARS-CoV-2 target nucleic acids are NOT DETECTED.  The SARS-CoV-2 RNA is generally detectable in upper respiratory specimens during the acute phase of infection. The lowest concentration of SARS-CoV-2 viral copies this assay can detect is 138 copies/mL. A negative result does not preclude SARS-Cov-2 infection and should not be used as the sole  basis for treatment or other patient management decisions. A negative result may occur with  improper specimen collection/handling, submission of specimen other than nasopharyngeal swab, presence of viral mutation(s) within the areas targeted by this assay, and inadequate number of viral copies(<138 copies/mL). A negative result must be combined with clinical observations, patient history, and epidemiological information. The expected result is Negative.  Fact Sheet for Patients:  EntrepreneurPulse.com.au  Fact Sheet for Healthcare Providers:  IncredibleEmployment.be  This test is no t yet approved or cleared by the [place] FDA and  has been authorized for detection and/or diagnosis of SARS-CoV-2 by FDA under an Emergency Use Authorization (EUA). This EUA will remain  in effect (meaning this test can be used) for the duration of the COVID-19 declaration under Section 564(b)(1) of the Act, 21 U.S.C.section 360bbb-3(b)(1), unless the authorization is terminated  or revoked sooner.       Influenza A by PCR NEGATIVE NEGATIVE Final   Influenza B by PCR NEGATIVE NEGATIVE Final    Comment: (NOTE) The Xpert Xpress SARS-CoV-2/FLU/RSV plus assay is intended as an aid in the diagnosis of influenza from Nasopharyngeal swab specimens and should not be used as a sole basis for treatment. Nasal washings and aspirates are unacceptable for Xpert Xpress SARS-CoV-2/FLU/RSV testing.  Fact Sheet for Patients: EntrepreneurPulse.com.au  Fact Sheet for Healthcare Providers: IncredibleEmployment.be  This test is not yet approved or cleared by the [place] FDA and has been authorized for detection and/or diagnosis of SARS-CoV-2 by FDA under an Emergency Use Authorization (EUA). This EUA will remain in effect (meaning this test can be used) for the duration of the COVID-19 declaration under Section 564(b)(1) of the Act,  21 U.S.C. section 360bbb-3(b)(1), unless the authorization is terminated or revoked.  Performed at [facility], [address] [place], [place] [address]   Culture, blood (routine x 2)     Status: None (Preliminary result)   Collection Time: 04/06/21  7:28 AM   Specimen: BLOOD  Result Value Ref Range Status   Specimen Description   Final    BLOOD RIGHT ANTECUBITAL Performed at [facility] [address] [place], [place] [address]    Special Requests   Final    BOTTLES DRAWN AEROBIC AND ANAEROBIC Blood Culture adequate volume Performed at [facility] [address], [place], [place] [address]    Culture   Final    NO GROWTH <  24 HOURS Performed at [facility], [place] [address], [place], [place] [address]    Report Status PENDING  Incomplete  Culture, blood (routine x 2)     Status: None (Preliminary result)   Collection Time: 04/06/21  7:28 AM   Specimen: BLOOD  Result Value Ref Range Status   Specimen Description   Final    BLOOD LEFT ANTECUBITAL Performed at [facility] [address], [place], [place] [address]    Special Requests   Final    BOTTLES DRAWN AEROBIC AND ANAEROBIC Blood Culture adequate volume Performed at [facility] [address] [place], [place] [address]    Culture   Final    NO GROWTH < 24 HOURS Performed at [facility] [address], [place], [place] [address]    Report Status PENDING  Incomplete     Studies: CT HEAD WO CONTRAST  Result Date: 04/06/2021 CLINICAL DATA:  Change in mental status.  Hematochezia EXAM: CT HEAD WITHOUT CONTRAST TECHNIQUE: Contiguous axial images were obtained from the base of the skull through the vertex without intravenous contrast. COMPARISON:  Brain MRI 05/02/2019 FINDINGS: Brain: No evidence of acute infarction, hemorrhage, hydrocephalus, extra-axial collection or mass lesion/mass effect. Cerebral and  especially cerebellar atrophy. Chronic small vessel ischemia throughout the cerebral white matter. Chronic lacunar infarct at the right pons. Vascular: No hyperdense vessel or unexpected calcification. Skull: Normal. Negative for fracture or focal lesion. Sinuses/Orbits: No acute finding. IMPRESSION: No acute finding. Atrophy and extensive chronic small vessel ischemia. Electronically Signed   By: Monte Fantasia M.D.   On: 04/06/2021 08:58   CT ANGIO CHEST PE W OR WO CONTRAST  Result Date: 04/06/2021 CLINICAL DATA:  Chest pain and altered mental status. EXAM: CT ANGIOGRAPHY CHEST WITH CONTRAST TECHNIQUE: Multidetector CT imaging of the chest was performed using the standard protocol during bolus administration of intravenous contrast. Multiplanar CT image reconstructions and MIPs were obtained to evaluate the vascular anatomy. CONTRAST:  48mL OMNIPAQUE IOHEXOL 350 MG/ML SOLN COMPARISON:  CT angiogram abdomen and pelvis including lower lung regions April 05, 2021 FINDINGS: There are scattered foci calcification in visualized great vessels. There are multiple foci of aortic atherosclerosis. There is an area of apparent plaque ulceration along the leftward aspect of the distal aorta near the gastroesophageal junction. The focus of plaque ulceration measures 1.8 x 0.7 cm. There is less than 50% diameter narrowing of the distal descending thoracic aorta in this area. No other plaque ulceration evident on this study. No pericardial effusion or pericardial thickening. There are foci of aortic atherosclerosis. There are foci of coronary artery calcification. Mediastinum/Nodes: Thyroid appears unremarkable. No evident thoracic adenopathy. No appreciable esophageal lesions. Lungs/Pleura: There is scarring in the extreme lung apices. There is mild bibasilar atelectasis. There is no edema or airspace opacity. No pleural effusions. No pneumothorax. Trachea and major bronchial structures appear patent. Upper Abdomen: There  are gallstones within the gallbladder. Contrast is seen within the gallbladder from recent CT angiogram abdomen and pelvis. Cyst arising from left kidney laterally measuring 1.8 x 1.7 cm noted. Musculoskeletal: No blastic or lytic bone lesions. Degenerative change noted in thoracic spine. There are no chest wall lesions. Review of the MIP images confirms the above findings. IMPRESSION: 1. Penetrating focal ulceration along the leftward aspect of the descending thoracic aorta near the gastroesophageal junction level, stable. No similar changes elsewhere. No frank dissection. Moderate atherosclerotic plaque in this region of ulceration noted. No thoracic aortic  aneurysm. 2.  No evident pulmonary embolus. 3. Bibasilar atelectasis. No edema or airspace opacity. No pleural effusions. 4.  Cholelithiasis. 5. There are foci of great vessel and coronary artery calcification. Aortic Atherosclerosis (ICD10-I70.0). Electronically Signed   By: Lowella Grip III M.D.   On: 04/06/2021 09:21   NM GI Blood Loss  Result Date: 04/06/2021 CLINICAL DATA:  GI bleed EXAM: NUCLEAR MEDICINE GASTROINTESTINAL BLEEDING SCAN TECHNIQUE: Sequential abdominal images were obtained following intravenous administration of Tc-42m labeled red blood cells. RADIOPHARMACEUTICALS:  22 mCi Tc-40m pertechnetate in-vitro labeled red cells. COMPARISON:  CT 04/05/2021 FINDINGS: There is excessive motion on multiple images. Imaging terminated slightly prematurely secondary to concerns over patient safety and motion. No definitive foci of tracer extravasation or static or dynamic foci of activity. IMPRESSION: Limited study secondary to excessive patient motion. No definite acute GI bleeding on current exam. Electronically Signed   By: Donavan Foil M.D.   On: 04/06/2021 03:55   ECHOCARDIOGRAM COMPLETE  Result Date: 04/06/2021    ECHOCARDIOGRAM REPORT   Patient Name:   DETRIA CUMMINGS Date of Exam: 04/06/2021 Medical Rec #:  [id number]     Height:        63.0 in Accession #:    [id number]    Weight:       118.4 lb Date of Birth:  07-Feb-1936    BSA:          1.547 m Patient Age:    79 years      BP:           124/72 mmHg Patient Gender: F             HR:           112 bpm. Exam Location:  Inpatient Procedure: 2D Echo, 3D Echo, Cardiac Doppler, Color Doppler and Strain Analysis Indications:    Endocarditis.  History:        Patient has prior history of Echocardiogram examinations, most                 recent 09/30/2018. Abnormal ECG and Tachycardia,                 Signs/Symptoms:Murmur and Alzheimer's; Risk Factors:Diabetes and                 Hypertension.  Sonographer:    Roseanna Rainbow RDCS Referring Phys: [id number] Sherryll Burger [facility]  Sonographer Comments: Global longitudinal strain was attempted. IMPRESSIONS  1. Left ventricular ejection fraction, by estimation, is 60 to 65%. The left ventricle has normal function. The left ventricle has no regional wall motion abnormalities. Left ventricular diastolic parameters are indeterminate. The average left ventricular global longitudinal strain is -15.9 %. The global longitudinal strain is normal.  2. Right ventricular systolic function is normal. The right ventricular size is normal. There is normal pulmonary artery systolic pressure.  3. A small pericardial effusion is present. The pericardial effusion is posterior to the left ventricle.  4. The mitral valve is degenerative. Trivial mitral valve regurgitation. No evidence of mitral stenosis. Moderate mitral annular calcification.  5. Significant AV sclerosis particularly the non coronary cusp without stenosis . The aortic valve is calcified. There is severe calcifcation of the aortic valve. There is severe thickening of the aortic valve. Aortic valve regurgitation is trivial. Mild to moderate aortic valve sclerosis/calcification is present, without any evidence of aortic stenosis.  6. The inferior vena cava is normal in size with greater than 50% respiratory variability,  suggesting right  atrial pressure of 3 mmHg. FINDINGS  Left Ventricle: Left ventricular ejection fraction, by estimation, is 60 to 65%. The left ventricle has normal function. The left ventricle has no regional wall motion abnormalities. The average left ventricular global longitudinal strain is -15.9 %. The global longitudinal strain is normal. 3D left ventricular ejection fraction analysis performed but not reported based on interpreter judgement due to suboptimal quality. The left ventricular internal cavity size was normal in size. There is no left ventricular hypertrophy. Left ventricular diastolic parameters are indeterminate. Right Ventricle: The right ventricular size is normal. No increase in right ventricular wall thickness. Right ventricular systolic function is normal. There is normal pulmonary artery systolic pressure. The tricuspid regurgitant velocity is 2.02 m/s, and  with an assumed right atrial pressure of 3 mmHg, the estimated right ventricular systolic pressure is 92.1 mmHg. Left Atrium: Left atrial size was normal in size. Right Atrium: Right atrial size was normal in size. Pericardium: A small pericardial effusion is present. The pericardial effusion is posterior to the left ventricle. Mitral Valve: The mitral valve is degenerative in appearance. There is moderate thickening of the mitral valve leaflet(s). There is moderate calcification of the mitral valve leaflet(s). Moderate mitral annular calcification. Trivial mitral valve regurgitation. No evidence of mitral valve stenosis. Tricuspid Valve: The tricuspid valve is normal in structure. Tricuspid valve regurgitation is mild . No evidence of tricuspid stenosis. Aortic Valve: Significant AV sclerosis particularly the non coronary cusp without stenosis. The aortic valve is calcified. There is severe calcifcation of the aortic valve. There is severe thickening of the aortic valve. There is severe aortic valve annular calcification. Aortic valve  regurgitation is trivial. Mild to moderate aortic valve sclerosis/calcification is present, without any evidence of aortic stenosis. Aortic valve mean gradient measures 7.0 mmHg. Aortic valve peak gradient measures 12.2 mmHg. Aortic valve area, by VTI measures 2.24 cm. Pulmonic Valve: The pulmonic valve was normal in structure. Pulmonic valve regurgitation is not visualized. No evidence of pulmonic stenosis. Aorta: The aortic root is normal in size and structure. Venous: The inferior vena cava is normal in size with greater than 50% respiratory variability, suggesting right atrial pressure of 3 mmHg. IAS/Shunts: No atrial level shunt detected by color flow Doppler.  LEFT VENTRICLE PLAX 2D LVIDd:         2.40 cm     Diastology LVIDs:         1.40 cm     LV e' medial:   7.83 cm/s LV PW:         1.80 cm     LV E/e' medial: 14.4 LV IVS:        1.80 cm LVOT diam:     2.05 cm     2D Longitudinal Strain LV SV:         72          2D Strain GLS Avg:     -15.9 % LV SV Index:   46 LVOT Area:     3.30 cm  LV Volumes (MOD) LV vol d, MOD A2C: 49.0 ml LV vol d, MOD A4C: 32.0 ml LV vol s, MOD A2C: 17.7 ml LV vol s, MOD A4C: 10.8 ml LV SV MOD A2C:     31.3 ml LV SV MOD A4C:     32.0 ml LV SV MOD BP:      25.3 ml RIGHT VENTRICLE             IVC RV S prime:  11.30 cm/s  IVC diam: 1.00 cm TAPSE (M-mode): 2.1 cm LEFT ATRIUM             Index       RIGHT ATRIUM           Index LA diam:        2.60 cm 1.68 cm/m  RA Area:     10.80 cm LA Vol (A2C):   20.0 ml 12.93 ml/m RA Volume:   18.80 ml  12.15 ml/m LA Vol (A4C):   21.8 ml 14.09 ml/m LA Biplane Vol: 21.6 ml 13.96 ml/m  AORTIC VALVE AV Area (Vmax):    2.47 cm AV Area (Vmean):   2.13 cm AV Area (VTI):     2.24 cm AV Vmax:           175.00 cm/s AV Vmean:          131.000 cm/s AV VTI:            0.320 m AV Peak Grad:      12.2 mmHg AV Mean Grad:      7.0 mmHg LVOT Vmax:         131.00 cm/s LVOT Vmean:        84.600 cm/s LVOT VTI:          0.217 m LVOT/AV VTI ratio: 0.68   AORTA Ao Root diam: 3.30 cm Ao Asc diam:  3.30 cm MITRAL VALVE                TRICUSPID VALVE MV Area (PHT): 6.32 cm     TR Peak grad:   16.3 mmHg MV Decel Time: 120 msec     TR Vmax:        202.00 cm/s MV E velocity: 113.00 cm/s                             SHUNTS                             Systemic VTI:  0.22 m                             Systemic Diam: 2.05 cm Jenkins Rouge MD Electronically signed by Jenkins Rouge MD Signature Date/Time: 04/06/2021/10:23:50 AM    Final    CT Angio Abd/Pel W and/or Wo Contrast  Result Date: 04/05/2021 CLINICAL DATA:  GI bleed EXAM: CTA ABDOMEN AND PELVIS WITHOUT AND WITH CONTRAST TECHNIQUE: Multidetector CT imaging of the abdomen and pelvis was performed using the standard protocol during bolus administration of intravenous contrast. Multiplanar reconstructed images and MIPs were obtained and reviewed to evaluate the vascular anatomy. CONTRAST:  105mL OMNIPAQUE IOHEXOL 350 MG/ML SOLN COMPARISON:  09/16/2019. FINDINGS: VASCULAR Aortic atherosclerosis. Redemonstrated chronic penetrating ulceration and/or dissection of the distal descending thoracic aorta (series 5, image 20). No aneurysm. Standard branching pattern of the abdominal aorta with solitary bilateral renal arteries. Review of the MIP images confirms the above findings. NON-VASCULAR Lower chest: No acute abnormality. Hepatobiliary: No focal liver abnormality is seen. No gallstones, gallbladder wall thickening, or biliary dilatation. Pancreas: Unremarkable. No pancreatic ductal dilatation or surrounding inflammatory changes. Spleen: Normal in size without focal abnormality. Adrenals/Urinary Tract: Adrenal glands are unremarkable. There are numerous tiny hypoenhancing lesions of the bilateral renal cortices. Thickening of the bladder. Large diverticulum of the posterior left aspect of the  bladder. Stomach/Bowel: Stomach is within normal limits. There is a large volume of hyperdense fluid in the transverse colon and distally  to the rectum. Sigmoid diverticulosis. Lymphatic: No enlarged abdominal or pelvic lymph nodes. Reproductive: Status post hysterectomy. Other: No abdominal wall hernia or abnormality. No abdominopelvic ascites. Musculoskeletal: No acute or significant osseous findings. IMPRESSION: 1. Large volume of hyperdense fluid in the transverse colon distally to the rectum, presumably blood. No evidence of specific nidus of GI bleeding or intraluminal contrast extravasation. 2. Sigmoid diverticulosis without evidence of acute diverticulitis. 3. Numerous tiny hypoenhancing lesions of the bilateral renal cortices, suggesting embolic shower, of uncertain acuity. 4. Redemonstrated chronic penetrating ulceration or dissection of the distal descending thoracic aorta. No aneurysm. 5.  Aortic Atherosclerosis (ICD10-I70.0). 6. Thickening of the urinary bladder, suggestive of nonspecific infectious or inflammatory cystitis. Correlate with urinalysis. Electronically Signed   By: Eddie Candle M.D.   On: 04/05/2021 21:10     Flora Lipps, MD  [facility] 04/07/2021  If 7PM-7AM, please contact night-coverage

## 2021-04-07 NOTE — Progress Notes (Signed)
Hypoglycemic Event  CBG: 53  Treatment: 8 oz juice/soda  Symptoms: None  Follow-up CBG: Time: 1729 CBG Result: 81  Possible Reasons for Event: Inadequate meal intake     Alejandra Davis

## 2021-04-07 NOTE — Progress Notes (Signed)
[facility] Progress Note  Alejandra Davis 85 y.o. 1936-05-13  CC:  Hematochezia   Subjective: Patient is pleasantly confused.  Denies any complaints.  Per RN, pt had a formed brown BM today.  She has only consumed 1/4 bowel prep.  ROS : Review of Systems  Unable to perform ROS: Dementia   Objective: Vital signs in last 24 hours: Vitals:   04/06/21 2100 04/07/21 0441  BP: (!) 158/100 (!) 144/83  Pulse: (!) 109 83  Resp:  20  Temp: 98.2 F (36.8 C) 98 F (36.7 C)  SpO2: 100% 99%    Physical Exam:  General:  Alert, pleasantly confused, cooperative, no distress  Head:  Normocephalic, without obvious abnormality, atraumatic  Eyes:  Anicteric sclera, EOMs intact  Lungs:   Clear to auscultation bilaterally, respirations unlabored  Heart:  Regular rate and rhythm, S1, S2 normal  Abdomen:   Soft, non-tender, non-distended, bowel sounds active all four quadrants  Extremities: Extremities normal, atraumatic, no  edema  Pulses: 2+ and symmetric    Lab Results: Recent Labs    04/06/21 0518 04/07/21 0515  NA 142 138  K 3.8 4.1  CL 111 108  CO2 23 23  GLUCOSE 105* 86  BUN 17 11  CREATININE 0.96 0.90  CALCIUM 8.0* 8.4*  MG 1.5* 1.8  PHOS  --  2.8   Recent Labs    04/05/21 1938 04/06/21 0518  AST 20 24  ALT 18 17  ALKPHOS 54 48  BILITOT 0.2* 1.0  PROT 6.6 5.8*  ALBUMIN 3.2* 3.0*   Recent Labs    04/05/21 1938 04/05/21 2230 04/06/21 1037 04/06/21 1615  WBC 6.0   < > 8.2 8.1  NEUTROABS 3.4  --   --   --   HGB 11.4*   < > 12.4 11.6*  HCT 35.0*   < > 37.6 34.6*  MCV 82.0   < > 84.3 83.4  PLT 232   < > 169 169   < > = values in this interval not displayed.   Recent Labs    04/05/21 2015  LABPROT 12.6  INR 1.0   Assessment: Painless hematochezia, no active bleeding noted on CT angiogram or nuclear bleeding scan.  Suspect diverticular origin. Hemoglobin 11.4 on presentation received 2 units PRBC and was 12.4 as of yesterday, stable.  No signs  of ongoing bleeding  Plan: Colonoscopy tomorrow if patient is able to complete prep.  Clear liquid diet, NPO after midnight.  Continue to monitor H&H with transfusion as needed to maintain Hgb >7.  [facility] will follow.  Salley Slaughter PA-C 04/07/2021, 10:20 AM  Contact #  [phone]

## 2021-04-07 NOTE — Plan of Care (Signed)
  Problem: Clinical Measurements: Goal: Will remain free from infection Outcome: Progressing Goal: Diagnostic test results will improve Outcome: Progressing   Problem: Activity: Goal: Risk for activity intolerance will decrease Outcome: Progressing   Problem: Elimination: Goal: Will not experience complications related to urinary retention Outcome: Progressing   Problem: Skin Integrity: Goal: Risk for impaired skin integrity will decrease Outcome: Progressing

## 2021-04-08 ENCOUNTER — Inpatient Hospital Stay (HOSPITAL_COMMUNITY): Payer: Medicare PPO | Admitting: Certified Registered Nurse Anesthetist

## 2021-04-08 ENCOUNTER — Encounter (HOSPITAL_COMMUNITY)
Admission: EM | Disposition: A | Discharge status: Home / Self Care | Payer: Self-pay | Source: Home / Self Care | Attending: Internal Medicine

## 2021-04-08 ENCOUNTER — Encounter (HOSPITAL_COMMUNITY): Payer: Self-pay | Admitting: Internal Medicine

## 2021-04-08 ENCOUNTER — Encounter (INDEPENDENT_AMBULATORY_CARE_PROVIDER_SITE_OTHER): Payer: Medicare PPO | Admitting: Ophthalmology

## 2021-04-08 DIAGNOSIS — H35373 Puckering of macula, bilateral: Secondary | ICD-10-CM

## 2021-04-08 DIAGNOSIS — H40003 Preglaucoma, unspecified, bilateral: Secondary | ICD-10-CM

## 2021-04-08 DIAGNOSIS — Z961 Presence of intraocular lens: Secondary | ICD-10-CM

## 2021-04-08 DIAGNOSIS — H35033 Hypertensive retinopathy, bilateral: Secondary | ICD-10-CM

## 2021-04-08 DIAGNOSIS — I10 Essential (primary) hypertension: Secondary | ICD-10-CM

## 2021-04-08 DIAGNOSIS — H34831 Tributary (branch) retinal vein occlusion, right eye, with macular edema: Secondary | ICD-10-CM

## 2021-04-08 DIAGNOSIS — H40051 Ocular hypertension, right eye: Secondary | ICD-10-CM

## 2021-04-08 DIAGNOSIS — H3581 Retinal edema: Secondary | ICD-10-CM

## 2021-04-08 DIAGNOSIS — E119 Type 2 diabetes mellitus without complications: Secondary | ICD-10-CM

## 2021-04-08 HISTORY — PX: COLONOSCOPY WITH PROPOFOL: SHX5780

## 2021-04-08 LAB — GLUCOSE, CAPILLARY
Glucose-Capillary: 88 mg/dL (ref 70–99)
Glucose-Capillary: 78 mg/dL (ref 70–99)
Glucose-Capillary: 119 mg/dL — ABNORMAL HIGH (ref 70–99)
Glucose-Capillary: 110 mg/dL — ABNORMAL HIGH (ref 70–99)

## 2021-04-08 SURGERY — COLONOSCOPY WITH PROPOFOL
Anesthesia: Monitor Anesthesia Care

## 2021-04-08 MED ORDER — LIDOCAINE 2% (20 MG/ML) 5 ML SYRINGE
INTRAMUSCULAR | Status: DC | PRN
  Administered 2021-04-08: 60 mg via INTRAVENOUS

## 2021-04-08 MED ORDER — PROPOFOL 10 MG/ML IV BOLUS
INTRAVENOUS | Status: DC | PRN
  Administered 2021-04-08 (×3): 10 mg via INTRAVENOUS
  Administered 2021-04-08: 20 mg via INTRAVENOUS

## 2021-04-08 MED ORDER — PROPOFOL 1000 MG/100ML IV EMUL
INTRAVENOUS | Status: AC
  Filled 2021-04-08: qty 100

## 2021-04-08 MED ORDER — SODIUM CHLORIDE 0.9 % IV SOLN: INTRAVENOUS | Status: DC | PRN

## 2021-04-08 MED ORDER — PROPOFOL 10 MG/ML IV BOLUS
INTRAVENOUS | Status: AC
  Filled 2021-04-08: qty 20

## 2021-04-08 MED ORDER — PROPOFOL 500 MG/50ML IV EMUL
INTRAVENOUS | Status: DC | PRN
  Administered 2021-04-08: 50 ug/kg/min via INTRAVENOUS

## 2021-04-08 SURGICAL SUPPLY — 22 items

## 2021-04-08 NOTE — Anesthesia Preprocedure Evaluation (Addendum)
Anesthesia Evaluation  Patient identified by MRN, date of birth, ID band Patient awake    Reviewed: Allergy & Precautions, NPO status , Patient's Chart, lab work & pertinent test results  Airway Mallampati: III  TM Distance: >3 FB Neck ROM: Full    Dental  (+) Edentulous Upper, Missing   Pulmonary neg pulmonary ROS,    Pulmonary exam normal breath sounds clear to auscultation       Cardiovascular hypertension, Pt. on medications and Pt. on home beta blockers Normal cardiovascular exam Rhythm:Regular Rate:Normal  ECG: ST, rate 140  ECHO: 1. Left ventricular ejection fraction, by estimation, is 60 to 65%. The left ventricle has normal function. The left ventricle has no regional wall motion abnormalities. Left ventricular diastolic parameters are indeterminate. The average left ventricular global longitudinal strain is -15.9 %. The global longitudinal strain is normal. 2. Right ventricular systolic function is normal. The right ventricular size is normal. There is normal pulmonary artery systolic pressure. 3. A small pericardial effusion is present. The pericardial effusion is posterior to the left ventricle. 4. The mitral valve is degenerative. Trivial mitral valve regurgitation. No evidence of mitral stenosis. Moderate mitral annular calcification. 5. Significant AV sclerosis particularly the non coronary cusp without stenosis . The aortic valve is calcified. There is severe calcifcation of the aortic valve. There is severe thickening of the aortic valve. Aortic valve regurgitation is trivial. Mild to moderate aortic valve sclerosis/calcification is present, without any evidence of aortic stenosis. 6. The inferior vena cava is normal in size with greater than 50% respiratory variability, suggesting right atrial pressure of 3 mmHg.   Neuro/Psych PSYCHIATRIC DISORDERS Dementia negative neurological ROS     GI/Hepatic negative  GI ROS, Neg liver ROS,   Endo/Other  diabetesHypothyroidism   Renal/GU negative Renal ROS     Musculoskeletal negative musculoskeletal ROS (+)   Abdominal   Peds  Hematology  (+) anemia ,   Anesthesia Other Findings Hematochezia  Reproductive/Obstetrics                            Anesthesia Physical Anesthesia Plan  ASA: III  Anesthesia Plan: MAC   Post-op Pain Management:    Induction: Intravenous  PONV Risk Score and Plan: 2 and Propofol infusion and Treatment may vary due to age or medical condition  Airway Management Planned: Simple Face Mask  Additional Equipment:   Intra-op Plan:   Post-operative Plan:   Informed Consent: I have reviewed the patients History and Physical, chart, labs and discussed the procedure including the risks, benefits and alternatives for the proposed anesthesia with the patient or authorized representative who has indicated his/her understanding and acceptance.     Dental advisory given  Plan Discussed with: CRNA  Anesthesia Plan Comments:         Anesthesia Quick Evaluation

## 2021-04-08 NOTE — Op Note (Signed)
[facility] Patient Name: Alejandra Davis Procedure Date: 04/08/2021 MRN: [id number] Attending MD: [name] , MD Date of Birth: April 24, 1936 CSN: [id number] Age: 85 Admit Type: Inpatient Procedure:                Colonoscopy Indications:              Last colonoscopy: 2016, Hematochezia Providers:                [name], MD, Dulcy Fanny, Benetta Spar, Technician, Cherylynn Ridges, Technician Referring MD:             hospital team Medicines:                Propofol per Anesthesia, Monitored Anesthesia Care Complications:            No immediate complications. Estimated Blood Loss:     Estimated blood loss: none. Procedure:                Pre-Anesthesia Assessment:                           - Prior to the procedure, a History and Physical                            was performed, and patient medications and                            allergies were reviewed. The patient's tolerance of                            previous anesthesia was also reviewed. The risks                            and benefits of the procedure and the sedation                            options and risks were discussed with the patient.                            All questions were answered, and informed consent                            was obtained. Prior Anticoagulants: The patient has                            taken no previous anticoagulant or antiplatelet                            agents. ASA Grade Assessment: III - A patient with                            severe systemic disease. After reviewing the risks  and benefits, the patient was deemed in                            satisfactory condition to undergo the procedure.                           After obtaining informed consent, the colonoscope                            was passed under direct vision. Throughout the                            procedure, the  patient's blood pressure, pulse, and                            oxygen saturations were monitored continuously. The                            [id number]-[id number] ([id number]) Olympus pediatric colonscope                            was introduced through the anus and advanced to the                            the cecum, identified by appendiceal orifice and                            ileocecal valve. The colonoscopy was performed                            without difficulty. The patient tolerated the                            procedure well. The quality of the bowel                            preparation was fair and fair but repeated                            irrigation led to a good and adequate prep. The                            ileocecal valve, appendiceal orifice, and rectum                            were photographed. Scope In: 12:44:30 PM Scope Out: 1:00:27 PM Scope Withdrawal Time: 0 hours 9 minutes 5 seconds  Total Procedure Duration: 0 hours 15 minutes 57 seconds  Findings:      The perianal and digital rectal examinations were normal.      Multiple small and large-mouthed diverticula were found in the sigmoid       colon.      Internal hemorrhoids were found during retroflexion. The hemorrhoids       were small and Grade I (internal hemorrhoids that do  not prolapse). Impression:               - Preparation of the colon was fair.                           - Diverticulosis in the sigmoid colon.                           - Internal hemorrhoids.                           - No specimens collected. Moderate Sedation:      Not Applicable - Patient had care per Anesthesia. Recommendation:           - Patient has a contact number available for                            emergencies. The signs and symptoms of potential                            delayed complications were discussed with the                            patient. Return to normal activities tomorrow.                             Written discharge instructions were provided to the                            patient.                           - High fiber diet.                           - Repeat colonoscopy is not recommended for                            screening purposes. Procedure Code(s):        --- Professional ---                           [phone], Colonoscopy, flexible; diagnostic, including                            collection of specimen(s) by brushing or washing,                            when performed (separate procedure) Diagnosis Code(s):        --- Professional ---                           K92.1, Melena (includes Hematochezia)                           K64.0, First degree hemorrhoids  K57.30, Diverticulosis of large intestine without                            perforation or abscess without bleeding CPT copyright 2019 American Medical Association. All rights reserved. The codes documented in this report are preliminary and upon coder review may  be revised to meet current compliance requirements. [name], MD 04/08/2021 1:22:14 PM This report has been signed electronically. Number of Addenda: 0

## 2021-04-08 NOTE — Progress Notes (Signed)
Patient became agitated and more confused than baseline.  Nurse and nurse tech provided redirection and comfort, but the patient only became more agitated. Daughter was called in an effort to redirect the patient.  The daughter decided to come in and stay overnight with the patient.  This significantly improved the patient's behavior.    Will continue to monitor.    Candie Mile, RN

## 2021-04-08 NOTE — Progress Notes (Signed)
PROGRESS NOTE  Alejandra Davis TKZ:[id number] DOB: 20-May-1936 DOA: 04/05/2021 PCP: Alejandra Hughs, NP   LOS: 2 days   Brief narrative: 85 year old female with past medical history of hypertension, hyperlipidemia, hypothyroidism and late onset Alzheimer's dementia presented to the hospital with complaints of gross rectal bleeding.  Patient did have multiple episodes so was brought into the hospital.  Patient is a poor historian due to underlying dementia.  Patient was initially brought to [facility] emergency department where CT imaging of the abdomen and pelvis was performed with contrast.  This revealed a large volume of hyperdense fluid in the transverse colon concerning for blood.  Patient was also found to have sigmoid diverticulosis without diverticulitis.   GI was then consulted and patient was admitted to the hospital.  She also underwent tagged RBC scan.  Patient was then considered for colonoscopy and was started on colon preparation.   Assessment/Plan:  Principal Problem:   Acute lower GI bleeding Active Problems:   Essential hypertension   Controlled type 2 diabetes mellitus without complication, without long-term current use of insulin (HCC)   Acquired hypothyroidism   Chronic kidney disease, stage 3a ([place])   Late onset Alzheimer's dementia without behavioral disturbance (HCC)   Sinus tachycardia   GI bleed  Acute lower GI bleeding, painless hematochezia Likely diverticular bleed.  5 or more episodes prior to presentation.  CTA of the abdomen and pelvis with trace fluid in the transverse colon, tagged RBC scan did not reveal any active bleed.   Aspirin on hold.  Will transfuse if hemoglobin is less than 7.  Patient received 2 units of packed RBC initially.  No further bleeds.  Latest hemoglobin of 11.6.  Patient did have a colonoscopy back in 2016.   Awaiting for colonoscopic evaluation after adequate bowel preparation.   Suspected Embolic phenomenon Incidental  finding of numerous hypoenhancing lesions of the bilateral renal cortices concerning for embolic phenomenon.  There is no signs and symptoms of infective endocarditis at this time.  EKG without A. fib.  D-dimer is significantly elevated.  2D echocardiogram showed preserved LV function at 60 to 65% with normal right ventricular systolic function no mention of vegetation. CTA of the chest  was done due to elevated D-dimer.  No evidence of pulmonary embolism.  Penetrating focal ulceration over the left side of the descending thoracic aorta was reported.  Spoke with the vascular surgery Dr. [name] on the findings on the renal and the aorta. He stated no indication for anticoagulation and likely incidental finding.  Vascular surgery plans to follow up as outpatient in one month.   Essential hypertension Continue amlodipine, hydralazine, metoprolol.  Monitor blood pressure.  Currently off lisinopril.  Will resume lisinopril after colonoscopy    Controlled type 2 diabetes mellitus without complication,  Continue sliding-scale insulin, Accu-Cheks, diabetic diet.  Hold Metformin.  Hemoglobin A1c of 6.4.  Latest POC glucose of 78.  Hypomagnesemia.  Improved after replacement.  Latest magnesium of 1.8    Acquired hypothyroidism Continue Synthroid.     Chronic kidney disease, stage 3a  Continue to monitor intake and output charting.  Received CT scan with IV contrast.  Will need to monitor renal function closely.  Resume lisinopril from tomorrow.  Latest creatinine of 0.9    Late onset Alzheimer's dementia without behavioral disturbance  Continue supportive care.     Sinus tachycardia Improved.  2D echocardiogram reviewed.  CTA chest without PE.   DVT prophylaxis: SCDs Start: 04/06/21 0028  Code Status: Full code  Family Communication:  None today.   Status is: Inpatient  The patient is inpatient because: IV treatments appropriate due to intensity of illness or inability to take PO, Inpatient  level of care appropriate due to severity of illness and Close monitoring of GI bleed, plan for colonoscopy evaluation.  Dispo: The patient is from: Home              Anticipated d/c is to: Home in 1 to 2 days              Patient currently is not medically stable to d/c.   Difficult to place patient No   Consultants:  GI  Procedures:  Transfusion of 2 units of packed RBC  Anti-infectives:  . None  Anti-infectives (From admission, onward)    None      Subjective: Today, patient was seen and examined at bedside.  Had delayed colonoscopy due to lack of adequate bowel preparation.  Awaiting for bowel prep present today.  Patient is a poor historian with underlying dementia. Objective: Vitals:   04/07/21 2047 04/08/21 0800  BP: (!) 150/99 (!) 145/83  Pulse: (!) 102 92  Resp: 16 15  Temp: 97.7 F (36.5 C) 97.9 F (36.6 C)  SpO2: 99% 100%    Intake/Output Summary (Last 24 hours) at 04/08/2021 1159 Last data filed at 04/08/2021 1000 Gross per 24 hour  Intake 0 ml  Output --  Net 0 ml   Filed Weights   04/05/21 1903 04/06/21 0344  Weight: 52.2 kg 53.7 kg   Body mass index is 20.97 kg/m.   Physical Exam: General: Thinly built,, not in obvious distress, underlying dementia, oriented to self HENT:   No scleral pallor or icterus noted. Oral mucosa is moist.  Chest:  Clear breath sounds.  Diminished breath sounds bilaterally. No crackles or wheezes.  CVS: S1 &S2 heard. No murmur.  Regular rate and rhythm. Abdomen: Soft, nontender, nondistended.  Bowel sounds are heard.   Extremities: No cyanosis, clubbing or edema.  Peripheral pulses are palpable. Psych: Alert, awake and oriented, normal mood CNS:  No cranial nerve deficits.  Power equal in all extremities.   Skin: Warm and dry.  No rashes noted.  Data Review: I have personally reviewed the following laboratory data and studies,  CBC: Recent Labs  Lab 04/05/21 1938 04/05/21 2230 04/06/21 0000 04/06/21 1037  04/06/21 1615  WBC 6.0 9.4 8.1 8.2 8.1  NEUTROABS 3.4  --   --   --   --   HGB 11.4* 13.2 12.3 12.4 11.6*  HCT 35.0* 40.6 36.9 37.6 34.6*  MCV 82.0 85.5 84.2 84.3 83.4  PLT 232 160 128* 169 622   Basic Metabolic Panel: Recent Labs  Lab 04/05/21 1938 04/06/21 0518 04/07/21 0515  NA 139 142 138  K 3.7 3.8 4.1  CL 104 111 108  CO2 27 23 23   GLUCOSE 122* 105* 86  BUN 20 17 11   CREATININE 1.31* 0.96 0.90  CALCIUM 8.8* 8.0* 8.4*  MG  --  1.5* 1.8  PHOS  --   --  2.8   Liver Function Tests: Recent Labs  Lab 04/05/21 1938 04/06/21 0518  AST 20 24  ALT 18 17  ALKPHOS 54 48  BILITOT 0.2* 1.0  PROT 6.6 5.8*  ALBUMIN 3.2* 3.0*   No results for input(s): LIPASE, AMYLASE in the last 168 hours. No results for input(s): AMMONIA in the last 168 hours. Cardiac Enzymes: No results for input(s):  CKTOTAL, CKMB, CKMBINDEX, TROPONINI in the last 168 hours. BNP (last 3 results) No results for input(s): BNP in the last 8760 hours.  ProBNP (last 3 results) No results for input(s): PROBNP in the last 8760 hours.  CBG: Recent Labs  Lab 04/07/21 1703 04/07/21 1729 04/07/21 2039 04/08/21 0804 04/08/21 1130  GLUCAP 53* 81 90 88 78   Recent Results (from the past 240 hour(s))  Resp Panel by RT-PCR (Flu A&B, Covid) Nasopharyngeal Swab     Status: None   Collection Time: 04/05/21  7:46 PM   Specimen: Nasopharyngeal Swab; Nasopharyngeal(NP) swabs in vial transport medium  Result Value Ref Range Status   SARS Coronavirus 2 by RT PCR NEGATIVE NEGATIVE Final    Comment: (NOTE) SARS-CoV-2 target nucleic acids are NOT DETECTED.  The SARS-CoV-2 RNA is generally detectable in upper respiratory specimens during the acute phase of infection. The lowest concentration of SARS-CoV-2 viral copies this assay can detect is 138 copies/mL. A negative result does not preclude SARS-Cov-2 infection and should not be used as the sole basis for treatment or other patient management decisions. A  negative result may occur with  improper specimen collection/handling, submission of specimen other than nasopharyngeal swab, presence of viral mutation(s) within the areas targeted by this assay, and inadequate number of viral copies(<138 copies/mL). A negative result must be combined with clinical observations, patient history, and epidemiological information. The expected result is Negative.  Fact Sheet for Patients:  EntrepreneurPulse.com.au  Fact Sheet for Healthcare Providers:  IncredibleEmployment.be  This test is no t yet approved or cleared by the [place] FDA and  has been authorized for detection and/or diagnosis of SARS-CoV-2 by FDA under an Emergency Use Authorization (EUA). This EUA will remain  in effect (meaning this test can be used) for the duration of the COVID-19 declaration under Section 564(b)(1) of the Act, 21 U.S.C.section 360bbb-3(b)(1), unless the authorization is terminated  or revoked sooner.       Influenza A by PCR NEGATIVE NEGATIVE Final   Influenza B by PCR NEGATIVE NEGATIVE Final    Comment: (NOTE) The Xpert Xpress SARS-CoV-2/FLU/RSV plus assay is intended as an aid in the diagnosis of influenza from Nasopharyngeal swab specimens and should not be used as a sole basis for treatment. Nasal washings and aspirates are unacceptable for Xpert Xpress SARS-CoV-2/FLU/RSV testing.  Fact Sheet for Patients: EntrepreneurPulse.com.au  Fact Sheet for Healthcare Providers: IncredibleEmployment.be  This test is not yet approved or cleared by the [place] FDA and has been authorized for detection and/or diagnosis of SARS-CoV-2 by FDA under an Emergency Use Authorization (EUA). This EUA will remain in effect (meaning this test can be used) for the duration of the COVID-19 declaration under Section 564(b)(1) of the Act, 21 U.S.C. section 360bbb-3(b)(1), unless the authorization  is terminated or revoked.  Performed at [facility], [address] [place], [place] [address]   Culture, blood (routine x 2)     Status: None (Preliminary result)   Collection Time: 04/06/21  7:28 AM   Specimen: BLOOD  Result Value Ref Range Status   Specimen Description   Final    BLOOD RIGHT ANTECUBITAL Performed at [place] [address], [place], [place] [address]    Special Requests   Final    BOTTLES DRAWN AEROBIC AND ANAEROBIC Blood Culture adequate volume Performed at [facility] [address] [place], [place] [address]    Culture   Final    NO  GROWTH 2 DAYS Performed at [facility], [place] [address], [place], [place] [address]    Report Status PENDING  Incomplete  Culture, blood (routine x 2)     Status: None (Preliminary result)   Collection Time: 04/06/21  7:28 AM   Specimen: BLOOD  Result Value Ref Range Status   Specimen Description   Final    BLOOD LEFT ANTECUBITAL Performed at [facility] [place] [address] [place], [place] [address]    Special Requests   Final    BOTTLES DRAWN AEROBIC AND ANAEROBIC Blood Culture adequate volume Performed at [facility] [address], [place], [place] [address]    Culture   Final    NO GROWTH 2 DAYS Performed at [place] [address] [place], [place] [address]    Report Status PENDING  Incomplete     Studies: No results found.   Flora Lipps, MD  [facility] 04/08/2021  If 7PM-7AM, please contact night-coverage

## 2021-04-08 NOTE — Interval H&P Note (Signed)
History and Physical Interval Note:  04/08/2021 12:30 PM  Alejandra Davis  has presented today for surgery, with the diagnosis of Hematochezia.  The various methods of treatment have been discussed with the patient and family. After consideration of risks, benefits and other options for treatment, the patient has consented to  Procedure(s): COLONOSCOPY WITH PROPOFOL (N/A) as a surgical intervention.  The patient's history has been reviewed, patient examined, no change in status, stable for surgery.  I have reviewed the patient's chart and labs.  Questions were answered to the patient's satisfaction.     [name]

## 2021-04-08 NOTE — Transfer of Care (Signed)
Immediate Anesthesia Transfer of Care Note  Patient: Alejandra Davis  Procedure(s) Performed: COLONOSCOPY WITH PROPOFOL (N/A )  Patient Location: Endoscopy Unit  Anesthesia Type:MAC  Level of Consciousness: drowsy and patient cooperative  Airway & Oxygen Therapy: Patient Spontanous Breathing and Patient connected to face mask oxygen  Post-op Assessment: Report given to RN and Post -op Vital signs reviewed and stable  Post vital signs: Reviewed and stable  Last Vitals:  Vitals Value Taken Time  BP    Temp    Pulse 69 04/08/21 1310  Resp 13 04/08/21 1310  SpO2 100 % 04/08/21 1310  Vitals shown include unvalidated device data.  Last Pain:  Vitals:   04/08/21 1157  TempSrc: Oral  PainSc: 0-No pain         Complications: No complications documented.

## 2021-04-08 NOTE — Plan of Care (Signed)

## 2021-04-08 NOTE — Brief Op Note (Signed)
Diverticulosis and internal hemorrhoids noted on colonoscopy without any active bleeding. Suspect diverticular bleed that has resolved. No further GI workup planned. Soft diet. Will sign off. Call if questions. F/U with GI prn.

## 2021-04-09 ENCOUNTER — Encounter (HOSPITAL_COMMUNITY): Payer: Self-pay | Admitting: Gastroenterology

## 2021-04-09 DIAGNOSIS — K5791 Diverticulosis of intestine, part unspecified, without perforation or abscess with bleeding: Secondary | ICD-10-CM

## 2021-04-09 LAB — TYPE AND SCREEN
ABO/RH(D): O POS
Antibody Screen: NEGATIVE
Unit division: 0

## 2021-04-09 LAB — BASIC METABOLIC PANEL
Anion gap: 7 (ref 5–15)
BUN: 12 mg/dL (ref 8–23)
CO2: 25 mmol/L (ref 22–32)
Calcium: 8.9 mg/dL (ref 8.9–10.3)
Chloride: 107 mmol/L (ref 98–111)
Creatinine, Ser: 1.11 mg/dL — ABNORMAL HIGH (ref 0.44–1.00)
GFR, Estimated: 50 mL/min — ABNORMAL LOW (ref >60)
Glucose, Bld: 104 mg/dL — ABNORMAL HIGH (ref 70–99)
Potassium: 3.9 mmol/L (ref 3.5–5.1)
Sodium: 139 mmol/L (ref 135–145)

## 2021-04-09 LAB — CBC
HCT: 37 % (ref 36.0–46.0)
Hemoglobin: 11.9 g/dL — ABNORMAL LOW (ref 12.0–15.0)
MCH: 27.9 pg (ref 26.0–34.0)
MCHC: 32.2 g/dL (ref 30.0–36.0)
MCV: 86.7 fL (ref 80.0–100.0)
Platelets: 211 10*3/uL (ref 150–400)
RBC: 4.27 MIL/uL (ref 3.87–5.11)
RDW: 16.6 % — ABNORMAL HIGH (ref 11.5–15.5)
WBC: 5.7 10*3/uL (ref 4.0–10.5)
nRBC: 0 % (ref 0.0–0.2)

## 2021-04-09 LAB — BPAM RBC
Blood Product Expiration Date: 202205102359
Unit Type and Rh: 5100

## 2021-04-09 LAB — GLUCOSE, CAPILLARY
Glucose-Capillary: 93 mg/dL (ref 70–99)
Glucose-Capillary: 95 mg/dL (ref 70–99)

## 2021-04-09 NOTE — Care Management Important Message (Signed)
Important Message  Patient Details IM Letter given to the Patient. Name: Alejandra Davis MRN: [id number] Date of Birth: [birth year]   Medicare Important Message Given:  Yes     Kerin Salen 04/09/2021, 2:32 PM

## 2021-04-09 NOTE — Discharge Summary (Addendum)
Physician Discharge Summary  Alejandra Davis QQP:[id number] DOB: 1936-09-11 DOA: 04/05/2021  PCP: [name], NP  Admit date: 04/05/2021 Discharge date: 04/09/2021  Admitted From: Home  Discharge disposition: Home   Recommendations for Outpatient Follow-Up:   . Follow up with your primary care provider in one week.  . Check CBC, BMP, magnesium in the next visit . Follow up with Dr [name], vascular surgery in 1 week for suspected infarction in the kidneys.  Vascular surgery clinic to follow-up.  Discharge Diagnosis:   Principal Problem:   Acute lower GI bleeding Active Problems:   Essential hypertension   Controlled type 2 diabetes mellitus without complication, without long-term current use of insulin (HCC)   Acquired hypothyroidism   Chronic kidney disease, stage 3a ([place])   Late onset Alzheimer's dementia without behavioral disturbance (HCC)   Sinus tachycardia   GI bleed  Discharge Condition: Improved.  Diet recommendation: Low sodium, heart healthy.  Carbohydrate-modified. High fiber diet   Wound care: None.  Code status: Full.  History of Present Illness:   85 year old female with past medical history of hypertension, hyperlipidemia, hypothyroidism and late onset Alzheimer's dementia presented to the hospital with complaints of gross rectal bleeding.  Patient did have multiple episodes so was brought into the hospital.  Patient is a poor historian due to underlying dementia.  Patient was initially brought to [facility] emergency department where CT imaging of the abdomen and pelvis was performed with contrast. This revealed a large volume of hyperdense fluid in the transverse colon concerning for blood. Patient was also found to have sigmoid diverticulosis without diverticulitis.  GI was then consulted and patient was admitted to the hospital.  She also underwent tagged RBC scan.  Patient was then considered for colonoscopy and was started on colon  preparation.  Hospital Course:   Following conditions were addressed during hospitalization as listed below,  Acute lower GI bleeding, painless hematochezia Likely diverticular bleed.  5 or more episodes prior to presentation.  CTA of the abdomen and pelvis with trace fluid in the transverse colon, tagged RBC scan did not reveal any active bleed.  Patient underwent colonoscopic evaluation with findings of diverticulosis.  She was then subsequently initiated on soft diet which he tolerated.  Initially received 2 units of packed RBC.  Hemoglobin prior to discharge was 11.9.  I spoke with the patient's daughter regarding being on a high-fiber diet.  SuspectedEmbolic phenomenon Incidental finding of numerous hypoenhancing lesions of the bilateral renal cortices concerning for embolic phenomenon.  There is no signs and symptoms of infective endocarditis at this time.  EKG without A. fib.  D-dimer is significantly elevated.  2D echocardiogram showed preserved LV function at 60 to 65% with normal right ventricular systolic function no mention of vegetation. CTA of the chest  was done due to elevated D-dimer.  No evidence of pulmonary embolism.  Penetrating focal ulceration over the left side of the descending thoracic aorta was reported. Spoke with the vascular surgery Dr. [name] on the findings on the renal and the aorta. He stated no indication for anticoagulation and likely incidental finding.  Vascular surgery plans to follow up as outpatient in one month.  Inform the patient's daughter about it.   Essential hypertension Continue amlodipine, metoprolol and lisinopril at home.  Controlled type 2 diabetes mellitus without complication,  Continue sliding-scale insulin, Accu-Cheks, diabetic diet.    Resume Metformin on discharge Hemoglobin A1c of 6.4.   Hypomagnesemia.  Improved after replacement.  Latest magnesium of 1.8  Acquired hypothyroidism Continue Synthroid on  discharge..  Chronic kidney disease, stage 3a  Continue to monitor intake and output charting.  Received CT scan with IV contrast.   Latest creatinine of 1.1.  Late onset Alzheimer's dementia without behavioral disturbance  Continue supportive care.  Sinus tachycardia Improved.  2D echocardiogram reviewed.  CTA chest without PE.   Disposition.  At this time, patient is stable for disposition home with outpatient PCP and vascular surgery follow-up.  Spoke with the patient's daughter prior to disposition.  Medical Consultants:    GI  Procedures:    Colonoscopy on 04/08/2021 Subjective:   Today, patient was seen and examined at bedside.  Denies any pain, nausea, vomiting.  Has tolerated oral diet.  No mention of further bleed.  Discharge Exam:   Vitals:   04/08/21 2116 04/09/21 0650  BP: 116/60 (!) 146/94  Pulse: 92 (!) 107  Resp: 20 20  Temp: 98 F (36.7 C) 98.2 F (36.8 C)  SpO2: 100% 100%   Vitals:   04/08/21 1330 04/08/21 1348 04/08/21 2116 04/09/21 0650  BP: (!) 184/80 (!) 170/86 116/60 (!) 146/94  Pulse: 67 73 92 (!) 107  Resp: 10 10 20 20   Temp:  98 F (36.7 C) 98 F (36.7 C) 98.2 F (36.8 C)  TempSrc:  Oral    SpO2: 100% 100% 100% 100%  Weight:      Height:       General: Alert awake, not in obvious distress, underlying dementia, oriented to self HENT: pupils equally reacting to light, mild pallor noted oral mucosa is moist.  Chest:  Clear breath sounds.  Diminished breath sounds bilaterally. No crackles or wheezes.  CVS: S1 &S2 heard. No murmur.  Regular rate and rhythm. Abdomen: Soft, nontender, nondistended.  Bowel sounds are heard.   Extremities: No cyanosis, clubbing or edema.  Peripheral pulses are palpable. Psych: Alert, awake but oriented to self, as underlying dementia.  Impaired memory. CNS:  No cranial nerve deficits.  Power equal in all extremities.   Skin: Warm and dry.  No rashes noted.  The results of significant diagnostics  from this hospitalization (including imaging, microbiology, ancillary and laboratory) are listed below for reference.     Diagnostic Studies:   CT HEAD WO CONTRAST  Result Date: 04/06/2021 CLINICAL DATA:  Change in mental status.  Hematochezia EXAM: CT HEAD WITHOUT CONTRAST TECHNIQUE: Contiguous axial images were obtained from the base of the skull through the vertex without intravenous contrast. COMPARISON:  Brain MRI 05/02/2019 FINDINGS: Brain: No evidence of acute infarction, hemorrhage, hydrocephalus, extra-axial collection or mass lesion/mass effect. Cerebral and especially cerebellar atrophy. Chronic small vessel ischemia throughout the cerebral white matter. Chronic lacunar infarct at the right pons. Vascular: No hyperdense vessel or unexpected calcification. Skull: Normal. Negative for fracture or focal lesion. Sinuses/Orbits: No acute finding. IMPRESSION: No acute finding. Atrophy and extensive chronic small vessel ischemia. Electronically Signed   By: Monte Fantasia M.D.   On: 04/06/2021 08:58   CT ANGIO CHEST PE W OR WO CONTRAST  Result Date: 04/06/2021 CLINICAL DATA:  Chest pain and altered mental status. EXAM: CT ANGIOGRAPHY CHEST WITH CONTRAST TECHNIQUE: Multidetector CT imaging of the chest was performed using the standard protocol during bolus administration of intravenous contrast. Multiplanar CT image reconstructions and MIPs were obtained to evaluate the vascular anatomy. CONTRAST:  57mL OMNIPAQUE IOHEXOL 350 MG/ML SOLN COMPARISON:  CT angiogram abdomen and pelvis including lower lung regions April 05, 2021 FINDINGS: There  are scattered foci calcification in visualized great vessels. There are multiple foci of aortic atherosclerosis. There is an area of apparent plaque ulceration along the leftward aspect of the distal aorta near the gastroesophageal junction. The focus of plaque ulceration measures 1.8 x 0.7 cm. There is less than 50% diameter narrowing of the distal descending  thoracic aorta in this area. No other plaque ulceration evident on this study. No pericardial effusion or pericardial thickening. There are foci of aortic atherosclerosis. There are foci of coronary artery calcification. Mediastinum/Nodes: Thyroid appears unremarkable. No evident thoracic adenopathy. No appreciable esophageal lesions. Lungs/Pleura: There is scarring in the extreme lung apices. There is mild bibasilar atelectasis. There is no edema or airspace opacity. No pleural effusions. No pneumothorax. Trachea and major bronchial structures appear patent. Upper Abdomen: There are gallstones within the gallbladder. Contrast is seen within the gallbladder from recent CT angiogram abdomen and pelvis. Cyst arising from left kidney laterally measuring 1.8 x 1.7 cm noted. Musculoskeletal: No blastic or lytic bone lesions. Degenerative change noted in thoracic spine. There are no chest wall lesions. Review of the MIP images confirms the above findings. IMPRESSION: 1. Penetrating focal ulceration along the leftward aspect of the descending thoracic aorta near the gastroesophageal junction level, stable. No similar changes elsewhere. No frank dissection. Moderate atherosclerotic plaque in this region of ulceration noted. No thoracic aortic aneurysm. 2.  No evident pulmonary embolus. 3. Bibasilar atelectasis. No edema or airspace opacity. No pleural effusions. 4.  Cholelithiasis. 5. There are foci of great vessel and coronary artery calcification. Aortic Atherosclerosis (ICD10-I70.0). Electronically Signed   By: Lowella Grip III M.D.   On: 04/06/2021 09:21   NM GI Blood Loss  Result Date: 04/06/2021 CLINICAL DATA:  GI bleed EXAM: NUCLEAR MEDICINE GASTROINTESTINAL BLEEDING SCAN TECHNIQUE: Sequential abdominal images were obtained following intravenous administration of Tc-24m labeled red blood cells. RADIOPHARMACEUTICALS:  22 mCi Tc-64m pertechnetate in-vitro labeled red cells. COMPARISON:  CT 04/05/2021 FINDINGS:  There is excessive motion on multiple images. Imaging terminated slightly prematurely secondary to concerns over patient safety and motion. No definitive foci of tracer extravasation or static or dynamic foci of activity. IMPRESSION: Limited study secondary to excessive patient motion. No definite acute GI bleeding on current exam. Electronically Signed   By: Donavan Foil M.D.   On: 04/06/2021 03:55   ECHOCARDIOGRAM COMPLETE  Result Date: 04/06/2021    ECHOCARDIOGRAM REPORT   Patient Name:   Alejandra Davis Date of Exam: 04/06/2021 Medical Rec #:  [id number]     Height:       63.0 in Accession #:    [id number]    Weight:       118.4 lb Date of Birth:  24-Nov-1936    BSA:          1.547 m Patient Age:    69 years      BP:           124/72 mmHg Patient Gender: F             HR:           112 bpm. Exam Location:  Inpatient Procedure: 2D Echo, 3D Echo, Cardiac Doppler, Color Doppler and Strain Analysis Indications:    Endocarditis.  History:        Patient has prior history of Echocardiogram examinations, most                 recent 09/30/2018. Abnormal ECG and Tachycardia,  Signs/Symptoms:Murmur and Alzheimer's; Risk Factors:Diabetes and                 Hypertension.  Sonographer:    Roseanna Rainbow RDCS Referring Phys: [id number] Sherryll Burger [facility]  Sonographer Comments: Global longitudinal strain was attempted. IMPRESSIONS  1. Left ventricular ejection fraction, by estimation, is 60 to 65%. The left ventricle has normal function. The left ventricle has no regional wall motion abnormalities. Left ventricular diastolic parameters are indeterminate. The average left ventricular global longitudinal strain is -15.9 %. The global longitudinal strain is normal.  2. Right ventricular systolic function is normal. The right ventricular size is normal. There is normal pulmonary artery systolic pressure.  3. A small pericardial effusion is present. The pericardial effusion is posterior to the left ventricle.  4. The mitral  valve is degenerative. Trivial mitral valve regurgitation. No evidence of mitral stenosis. Moderate mitral annular calcification.  5. Significant AV sclerosis particularly the non coronary cusp without stenosis . The aortic valve is calcified. There is severe calcifcation of the aortic valve. There is severe thickening of the aortic valve. Aortic valve regurgitation is trivial. Mild to moderate aortic valve sclerosis/calcification is present, without any evidence of aortic stenosis.  6. The inferior vena cava is normal in size with greater than 50% respiratory variability, suggesting right atrial pressure of 3 mmHg. FINDINGS  Left Ventricle: Left ventricular ejection fraction, by estimation, is 60 to 65%. The left ventricle has normal function. The left ventricle has no regional wall motion abnormalities. The average left ventricular global longitudinal strain is -15.9 %. The global longitudinal strain is normal. 3D left ventricular ejection fraction analysis performed but not reported based on interpreter judgement due to suboptimal quality. The left ventricular internal cavity size was normal in size. There is no left ventricular hypertrophy. Left ventricular diastolic parameters are indeterminate. Right Ventricle: The right ventricular size is normal. No increase in right ventricular wall thickness. Right ventricular systolic function is normal. There is normal pulmonary artery systolic pressure. The tricuspid regurgitant velocity is 2.02 m/s, and  with an assumed right atrial pressure of 3 mmHg, the estimated right ventricular systolic pressure is 26.8 mmHg. Left Atrium: Left atrial size was normal in size. Right Atrium: Right atrial size was normal in size. Pericardium: A small pericardial effusion is present. The pericardial effusion is posterior to the left ventricle. Mitral Valve: The mitral valve is degenerative in appearance. There is moderate thickening of the mitral valve leaflet(s). There is moderate  calcification of the mitral valve leaflet(s). Moderate mitral annular calcification. Trivial mitral valve regurgitation. No evidence of mitral valve stenosis. Tricuspid Valve: The tricuspid valve is normal in structure. Tricuspid valve regurgitation is mild . No evidence of tricuspid stenosis. Aortic Valve: Significant AV sclerosis particularly the non coronary cusp without stenosis. The aortic valve is calcified. There is severe calcifcation of the aortic valve. There is severe thickening of the aortic valve. There is severe aortic valve annular calcification. Aortic valve regurgitation is trivial. Mild to moderate aortic valve sclerosis/calcification is present, without any evidence of aortic stenosis. Aortic valve mean gradient measures 7.0 mmHg. Aortic valve peak gradient measures 12.2 mmHg. Aortic valve area, by VTI measures 2.24 cm. Pulmonic Valve: The pulmonic valve was normal in structure. Pulmonic valve regurgitation is not visualized. No evidence of pulmonic stenosis. Aorta: The aortic root is normal in size and structure. Venous: The inferior vena cava is normal in size with greater than 50% respiratory variability, suggesting right atrial pressure of 3 mmHg. IAS/Shunts:  No atrial level shunt detected by color flow Doppler.  LEFT VENTRICLE PLAX 2D LVIDd:         2.40 cm     Diastology LVIDs:         1.40 cm     LV e' medial:   7.83 cm/s LV PW:         1.80 cm     LV E/e' medial: 14.4 LV IVS:        1.80 cm LVOT diam:     2.05 cm     2D Longitudinal Strain LV SV:         72          2D Strain GLS Avg:     -15.9 % LV SV Index:   46 LVOT Area:     3.30 cm  LV Volumes (MOD) LV vol d, MOD A2C: 49.0 ml LV vol d, MOD A4C: 32.0 ml LV vol s, MOD A2C: 17.7 ml LV vol s, MOD A4C: 10.8 ml LV SV MOD A2C:     31.3 ml LV SV MOD A4C:     32.0 ml LV SV MOD BP:      25.3 ml RIGHT VENTRICLE             IVC RV S prime:     11.30 cm/s  IVC diam: 1.00 cm TAPSE (M-mode): 2.1 cm LEFT ATRIUM             Index       RIGHT ATRIUM            Index LA diam:        2.60 cm 1.68 cm/m  RA Area:     10.80 cm LA Vol (A2C):   20.0 ml 12.93 ml/m RA Volume:   18.80 ml  12.15 ml/m LA Vol (A4C):   21.8 ml 14.09 ml/m LA Biplane Vol: 21.6 ml 13.96 ml/m  AORTIC VALVE AV Area (Vmax):    2.47 cm AV Area (Vmean):   2.13 cm AV Area (VTI):     2.24 cm AV Vmax:           175.00 cm/s AV Vmean:          131.000 cm/s AV VTI:            0.320 m AV Peak Grad:      12.2 mmHg AV Mean Grad:      7.0 mmHg LVOT Vmax:         131.00 cm/s LVOT Vmean:        84.600 cm/s LVOT VTI:          0.217 m LVOT/AV VTI ratio: 0.68  AORTA Ao Root diam: 3.30 cm Ao Asc diam:  3.30 cm MITRAL VALVE                TRICUSPID VALVE MV Area (PHT): 6.32 cm     TR Peak grad:   16.3 mmHg MV Decel Time: 120 msec     TR Vmax:        202.00 cm/s MV E velocity: 113.00 cm/s                             SHUNTS                             Systemic VTI:  0.22 m  Systemic Diam: 2.05 cm Jenkins Rouge MD Electronically signed by Jenkins Rouge MD Signature Date/Time: 04/06/2021/10:23:50 AM    Final    CT Angio Abd/Pel W and/or Wo Contrast  Result Date: 04/05/2021 CLINICAL DATA:  GI bleed EXAM: CTA ABDOMEN AND PELVIS WITHOUT AND WITH CONTRAST TECHNIQUE: Multidetector CT imaging of the abdomen and pelvis was performed using the standard protocol during bolus administration of intravenous contrast. Multiplanar reconstructed images and MIPs were obtained and reviewed to evaluate the vascular anatomy. CONTRAST:  11mL OMNIPAQUE IOHEXOL 350 MG/ML SOLN COMPARISON:  09/16/2019. FINDINGS: VASCULAR Aortic atherosclerosis. Redemonstrated chronic penetrating ulceration and/or dissection of the distal descending thoracic aorta (series 5, image 20). No aneurysm. Standard branching pattern of the abdominal aorta with solitary bilateral renal arteries. Review of the MIP images confirms the above findings. NON-VASCULAR Lower chest: No acute abnormality. Hepatobiliary: No focal liver  abnormality is seen. No gallstones, gallbladder wall thickening, or biliary dilatation. Pancreas: Unremarkable. No pancreatic ductal dilatation or surrounding inflammatory changes. Spleen: Normal in size without focal abnormality. Adrenals/Urinary Tract: Adrenal glands are unremarkable. There are numerous tiny hypoenhancing lesions of the bilateral renal cortices. Thickening of the bladder. Large diverticulum of the posterior left aspect of the bladder. Stomach/Bowel: Stomach is within normal limits. There is a large volume of hyperdense fluid in the transverse colon and distally to the rectum. Sigmoid diverticulosis. Lymphatic: No enlarged abdominal or pelvic lymph nodes. Reproductive: Status post hysterectomy. Other: No abdominal wall hernia or abnormality. No abdominopelvic ascites. Musculoskeletal: No acute or significant osseous findings. IMPRESSION: 1. Large volume of hyperdense fluid in the transverse colon distally to the rectum, presumably blood. No evidence of specific nidus of GI bleeding or intraluminal contrast extravasation. 2. Sigmoid diverticulosis without evidence of acute diverticulitis. 3. Numerous tiny hypoenhancing lesions of the bilateral renal cortices, suggesting embolic shower, of uncertain acuity. 4. Redemonstrated chronic penetrating ulceration or dissection of the distal descending thoracic aorta. No aneurysm. 5.  Aortic Atherosclerosis (ICD10-I70.0). 6. Thickening of the urinary bladder, suggestive of nonspecific infectious or inflammatory cystitis. Correlate with urinalysis. Electronically Signed   By: Eddie Candle M.D.   On: 04/05/2021 21:10     Labs:   Basic Metabolic Panel: Recent Labs  Lab 04/05/21 1938 04/06/21 0518 04/07/21 0515  NA 139 142 138  K 3.7 3.8 4.1  CL 104 111 108  CO2 27 23 23   GLUCOSE 122* 105* 86  BUN 20 17 11   CREATININE 1.31* 0.96 0.90  CALCIUM 8.8* 8.0* 8.4*  MG  --  1.5* 1.8  PHOS  --   --  2.8   GFR Estimated Creatinine Clearance: 39.9  mL/min (by C-G formula based on SCr of 0.9 mg/dL). Liver Function Tests: Recent Labs  Lab 04/05/21 1938 04/06/21 0518  AST 20 24  ALT 18 17  ALKPHOS 54 48  BILITOT 0.2* 1.0  PROT 6.6 5.8*  ALBUMIN 3.2* 3.0*   No results for input(s): LIPASE, AMYLASE in the last 168 hours. No results for input(s): AMMONIA in the last 168 hours. Coagulation profile Recent Labs  Lab 04/05/21 2015  INR 1.0    CBC: Recent Labs  Lab 04/05/21 1938 04/05/21 2230 04/06/21 0000 04/06/21 1037 04/06/21 1615 04/09/21 0907  WBC 6.0 9.4 8.1 8.2 8.1 5.7  NEUTROABS 3.4  --   --   --   --   --   HGB 11.4* 13.2 12.3 12.4 11.6* 11.9*  HCT 35.0* 40.6 36.9 37.6 34.6* 37.0  MCV 82.0 85.5 84.2 84.3 83.4 86.7  PLT  232 160 128* 169 169 211   Cardiac Enzymes: No results for input(s): CKTOTAL, CKMB, CKMBINDEX, TROPONINI in the last 168 hours. BNP: Invalid input(s): POCBNP CBG: Recent Labs  Lab 04/08/21 0804 04/08/21 1130 04/08/21 1747 04/08/21 2114 04/09/21 0741  GLUCAP 88 78 119* 110* 93   D-Dimer No results for input(s): DDIMER in the last 72 hours. Hgb A1c No results for input(s): HGBA1C in the last 72 hours. Lipid Profile No results for input(s): CHOL, HDL, LDLCALC, TRIG, CHOLHDL, LDLDIRECT in the last 72 hours. Thyroid function studies No results for input(s): TSH, T4TOTAL, T3FREE, THYROIDAB in the last 72 hours.  Invalid input(s): FREET3 Anemia work up No results for input(s): VITAMINB12, FOLATE, FERRITIN, TIBC, IRON, RETICCTPCT in the last 72 hours. Microbiology Recent Results (from the past 240 hour(s))  Resp Panel by RT-PCR (Flu A&B, Covid) Nasopharyngeal Swab     Status: None   Collection Time: 04/05/21  7:46 PM   Specimen: Nasopharyngeal Swab; Nasopharyngeal(NP) swabs in vial transport medium  Result Value Ref Range Status   SARS Coronavirus 2 by RT PCR NEGATIVE NEGATIVE Final    Comment: (NOTE) SARS-CoV-2 target nucleic acids are NOT DETECTED.  The SARS-CoV-2 RNA is generally  detectable in upper respiratory specimens during the acute phase of infection. The lowest concentration of SARS-CoV-2 viral copies this assay can detect is 138 copies/mL. A negative result does not preclude SARS-Cov-2 infection and should not be used as the sole basis for treatment or other patient management decisions. A negative result may occur with  improper specimen collection/handling, submission of specimen other than nasopharyngeal swab, presence of viral mutation(s) within the areas targeted by this assay, and inadequate number of viral copies(<138 copies/mL). A negative result must be combined with clinical observations, patient history, and epidemiological information. The expected result is Negative.  Fact Sheet for Patients:  EntrepreneurPulse.com.au  Fact Sheet for Healthcare Providers:  IncredibleEmployment.be  This test is no t yet approved or cleared by the [place] FDA and  has been authorized for detection and/or diagnosis of SARS-CoV-2 by FDA under an Emergency Use Authorization (EUA). This EUA will remain  in effect (meaning this test can be used) for the duration of the COVID-19 declaration under Section 564(b)(1) of the Act, 21 U.S.C.section 360bbb-3(b)(1), unless the authorization is terminated  or revoked sooner.       Influenza A by PCR NEGATIVE NEGATIVE Final   Influenza B by PCR NEGATIVE NEGATIVE Final    Comment: (NOTE) The Xpert Xpress SARS-CoV-2/FLU/RSV plus assay is intended as an aid in the diagnosis of influenza from Nasopharyngeal swab specimens and should not be used as a sole basis for treatment. Nasal washings and aspirates are unacceptable for Xpert Xpress SARS-CoV-2/FLU/RSV testing.  Fact Sheet for Patients: EntrepreneurPulse.com.au  Fact Sheet for Healthcare Providers: IncredibleEmployment.be  This test is not yet approved or cleared by the [place] FDA  and has been authorized for detection and/or diagnosis of SARS-CoV-2 by FDA under an Emergency Use Authorization (EUA). This EUA will remain in effect (meaning this test can be used) for the duration of the COVID-19 declaration under Section 564(b)(1) of the Act, 21 U.S.C. section 360bbb-3(b)(1), unless the authorization is terminated or revoked.  Performed at [facility], [address] [place], [place] [address]   Culture, blood (routine x 2)     Status: None (Preliminary result)   Collection Time: 04/06/21  7:28 AM   Specimen: BLOOD  Result Value Ref Range Status   Specimen  Description   Final    BLOOD RIGHT ANTECUBITAL Performed at [facility] [address], [place], [place] [address]    Special Requests   Final    BOTTLES DRAWN AEROBIC AND ANAEROBIC Blood Culture adequate volume Performed at [facility] [address], [place], [place] [address]    Culture   Final    NO GROWTH 3 DAYS Performed at [facility], [place] [address], [place], [place] [address]    Report Status PENDING  Incomplete  Culture, blood (routine x 2)     Status: None (Preliminary result)   Collection Time: 04/06/21  7:28 AM   Specimen: BLOOD  Result Value Ref Range Status   Specimen Description   Final    BLOOD LEFT ANTECUBITAL Performed at [facility] [address], [place], [place] [address]    Special Requests   Final    BOTTLES DRAWN AEROBIC AND ANAEROBIC Blood Culture adequate volume Performed at [facility] [address] [place], [place] [address]    Culture   Final    NO GROWTH 3 DAYS Performed at [facility], [place] [address] [place], [place] [address]    Report Status PENDING  Incomplete     Discharge Instructions:   Discharge Instructions    Call MD for:   Complete by: As directed    If you experience ongoing bleeding.   Diet - low sodium heart healthy   Complete by: As  directed    High-fiber diet recommended   Discharge instructions   Complete by: As directed    Follow-up with your primary care physician in 1 week.  Check your blood work at that time.   Increase activity slowly   Complete by: As directed      Allergies as of 04/09/2021      Reactions   Donepezil Hcl    Stomach cramps       Medication List    TAKE these medications   amLODipine 10 MG tablet Commonly known as: NORVASC TAKE 1 TABLET(10 MG) BY MOUTH DAILY   aspirin EC 81 MG tablet Take 1 tablet (81 mg total) by mouth daily.   dorzolamide-timolol 22.3-6.8 MG/ML ophthalmic solution Commonly known as: COSOPT 1 drop 2 (two) times daily.   Lancets Ultra Thin 30G Misc 100 each by Does not apply route 3 (three) times a week.   levothyroxine 75 MCG tablet Commonly known as: SYNTHROID Take 1 tablet (75 mcg total) by mouth daily.   lisinopril 2.5 MG tablet Commonly known as: Zestril Take 1 tablet (2.5 mg total) by mouth daily.   loratadine 10 MG tablet Commonly known as: CLARITIN Take 1 tablet (10 mg total) by mouth daily. What changed:   when to take this  reasons to take this   metFORMIN 500 MG tablet Commonly known as: GLUCOPHAGE Take 1 tablet (500 mg total) by mouth 2 (two) times daily with a meal.   metoprolol tartrate 50 MG tablet Commonly known as: LOPRESSOR Take 1 tablet (50 mg total) by mouth daily.       Follow-up Information    Ngetich, Dinah C, NP Follow up in 1 week(s).   Specialty: Family Medicine Why: blood work and general followup Contact information: [place] [address] [phone]                Time coordinating discharge: 39 minutes  Signed:  Toniette Devera  [facility]  04/09/2021, 9:28 AM

## 2021-04-09 NOTE — Progress Notes (Signed)
Pt, son, Alroy Dust, and daughter-in-law given and explained discharge instructions for pt. Pt IVs removed. Tele removed. Pt dressed in personal clothing. Pt taken to main entrance via wheelchair.

## 2021-04-09 NOTE — Anesthesia Postprocedure Evaluation (Signed)
Anesthesia Post Note  Patient: JOSCELYNE RENVILLE  Procedure(s) Performed: COLONOSCOPY WITH PROPOFOL (N/A )     Patient location during evaluation: Endoscopy Anesthesia Type: MAC Level of consciousness: awake Pain management: pain level controlled Vital Signs Assessment: post-procedure vital signs reviewed and stable Respiratory status: spontaneous breathing, nonlabored ventilation, respiratory function stable and patient connected to nasal cannula oxygen Cardiovascular status: stable and blood pressure returned to baseline Postop Assessment: no apparent nausea or vomiting Anesthetic complications: no   No complications documented.  Last Vitals:  Vitals:   04/08/21 2116 04/09/21 0650  BP: 116/60 (!) 146/94  Pulse: 92 (!) 107  Resp: 20 20  Temp: 36.7 C 36.8 C  SpO2: 100% 100%    Last Pain:  Vitals:   04/08/21 2054  TempSrc:   PainSc: 0-No pain                 Shianna Bally P Kyrstin Campillo

## 2021-04-10 ENCOUNTER — Observation Stay (HOSPITAL_COMMUNITY)
Admission: EM | Admit: 2021-04-10 | Discharge: 2021-04-11 | Disposition: A | Discharge status: Home / Self Care | Payer: Medicare PPO | Attending: Family Medicine | Admitting: Family Medicine

## 2021-04-10 ENCOUNTER — Encounter (HOSPITAL_COMMUNITY): Payer: Self-pay

## 2021-04-10 ENCOUNTER — Other Ambulatory Visit: Payer: Self-pay

## 2021-04-10 DIAGNOSIS — E119 Type 2 diabetes mellitus without complications: Secondary | ICD-10-CM | POA: Diagnosis not present

## 2021-04-10 DIAGNOSIS — E1122 Type 2 diabetes mellitus with diabetic chronic kidney disease: Secondary | ICD-10-CM | POA: Insufficient documentation

## 2021-04-10 DIAGNOSIS — I10 Essential (primary) hypertension: Secondary | ICD-10-CM | POA: Diagnosis not present

## 2021-04-10 DIAGNOSIS — G301 Alzheimer's disease with late onset: Secondary | ICD-10-CM | POA: Diagnosis present

## 2021-04-10 DIAGNOSIS — N1831 Chronic kidney disease, stage 3a: Secondary | ICD-10-CM | POA: Diagnosis present

## 2021-04-10 DIAGNOSIS — I129 Hypertensive chronic kidney disease with stage 1 through stage 4 chronic kidney disease, or unspecified chronic kidney disease: Secondary | ICD-10-CM | POA: Diagnosis not present

## 2021-04-10 DIAGNOSIS — E039 Hypothyroidism, unspecified: Secondary | ICD-10-CM | POA: Diagnosis not present

## 2021-04-10 DIAGNOSIS — K625 Hemorrhage of anus and rectum: Secondary | ICD-10-CM | POA: Diagnosis present

## 2021-04-10 DIAGNOSIS — G309 Alzheimer's disease, unspecified: Secondary | ICD-10-CM | POA: Diagnosis not present

## 2021-04-10 DIAGNOSIS — Z7984 Long term (current) use of oral hypoglycemic drugs: Secondary | ICD-10-CM | POA: Insufficient documentation

## 2021-04-10 DIAGNOSIS — F0280 Dementia in other diseases classified elsewhere without behavioral disturbance: Secondary | ICD-10-CM | POA: Diagnosis not present

## 2021-04-10 DIAGNOSIS — Z20822 Contact with and (suspected) exposure to covid-19: Secondary | ICD-10-CM | POA: Diagnosis not present

## 2021-04-10 DIAGNOSIS — Z7982 Long term (current) use of aspirin: Secondary | ICD-10-CM | POA: Diagnosis not present

## 2021-04-10 DIAGNOSIS — K922 Gastrointestinal hemorrhage, unspecified: Secondary | ICD-10-CM | POA: Diagnosis not present

## 2021-04-10 DIAGNOSIS — Z79899 Other long term (current) drug therapy: Secondary | ICD-10-CM | POA: Diagnosis not present

## 2021-04-10 LAB — COMPREHENSIVE METABOLIC PANEL
ALT: 18 U/L (ref 0–44)
AST: 22 U/L (ref 15–41)
Albumin: 3.6 g/dL (ref 3.5–5.0)
Alkaline Phosphatase: 57 U/L (ref 38–126)
Anion gap: 5 (ref 5–15)
BUN: 17 mg/dL (ref 8–23)
CO2: 27 mmol/L (ref 22–32)
Calcium: 9.2 mg/dL (ref 8.9–10.3)
Chloride: 108 mmol/L (ref 98–111)
Creatinine, Ser: 1.25 mg/dL — ABNORMAL HIGH (ref 0.44–1.00)
GFR, Estimated: 43 mL/min — ABNORMAL LOW (ref >60)
Glucose, Bld: 135 mg/dL — ABNORMAL HIGH (ref 70–99)
Potassium: 3.5 mmol/L (ref 3.5–5.1)
Sodium: 140 mmol/L (ref 135–145)
Total Bilirubin: 0.4 mg/dL (ref 0.3–1.2)
Total Protein: 6.8 g/dL (ref 6.5–8.1)

## 2021-04-10 LAB — GLUCOSE, CAPILLARY
Glucose-Capillary: 105 mg/dL — ABNORMAL HIGH (ref 70–99)
Glucose-Capillary: 157 mg/dL — ABNORMAL HIGH (ref 70–99)

## 2021-04-10 LAB — BASIC METABOLIC PANEL
Anion gap: 8 (ref 5–15)
BUN: 13 mg/dL (ref 8–23)
CO2: 23 mmol/L (ref 22–32)
Calcium: 8.7 mg/dL — ABNORMAL LOW (ref 8.9–10.3)
Chloride: 107 mmol/L (ref 98–111)
Creatinine, Ser: 1.23 mg/dL — ABNORMAL HIGH (ref 0.44–1.00)
GFR, Estimated: 44 mL/min — ABNORMAL LOW (ref >60)
Glucose, Bld: 114 mg/dL — ABNORMAL HIGH (ref 70–99)
Potassium: 3.3 mmol/L — ABNORMAL LOW (ref 3.5–5.1)
Sodium: 138 mmol/L (ref 135–145)

## 2021-04-10 LAB — CBC WITH DIFFERENTIAL/PLATELET
Abs Immature Granulocytes: 0.03 10*3/uL (ref 0.00–0.07)
Basophils Absolute: 0 10*3/uL (ref 0.0–0.1)
Basophils Relative: 0 %
Eosinophils Absolute: 0.5 10*3/uL (ref 0.0–0.5)
Eosinophils Relative: 9 %
HCT: 32.4 % — ABNORMAL LOW (ref 36.0–46.0)
Hemoglobin: 10.7 g/dL — ABNORMAL LOW (ref 12.0–15.0)
Immature Granulocytes: 1 %
Lymphocytes Relative: 18 %
Lymphs Abs: 1 10*3/uL (ref 0.7–4.0)
MCH: 28.2 pg (ref 26.0–34.0)
MCHC: 33 g/dL (ref 30.0–36.0)
MCV: 85.5 fL (ref 80.0–100.0)
Monocytes Absolute: 0.6 10*3/uL (ref 0.1–1.0)
Monocytes Relative: 11 %
Neutro Abs: 3.5 10*3/uL (ref 1.7–7.7)
Neutrophils Relative %: 61 %
Platelets: 219 10*3/uL (ref 150–400)
RBC: 3.79 MIL/uL — ABNORMAL LOW (ref 3.87–5.11)
RDW: 16.5 % — ABNORMAL HIGH (ref 11.5–15.5)
WBC: 5.7 10*3/uL (ref 4.0–10.5)
nRBC: 0 % (ref 0.0–0.2)

## 2021-04-10 LAB — SARS CORONAVIRUS 2 (TAT 6-24 HRS): SARS Coronavirus 2: NEGATIVE

## 2021-04-10 LAB — HEMATOCRIT: HCT: 32.5 % — ABNORMAL LOW (ref 36.0–46.0)

## 2021-04-10 LAB — LIPASE, BLOOD: Lipase: 33 U/L (ref 11–51)

## 2021-04-10 LAB — TYPE AND SCREEN
ABO/RH(D): O POS
Antibody Screen: NEGATIVE

## 2021-04-10 LAB — HEMOGLOBIN: Hemoglobin: 10.6 g/dL — ABNORMAL LOW (ref 12.0–15.0)

## 2021-04-10 MED ORDER — ACETAMINOPHEN 325 MG PO TABS: 650.0000 mg | ORAL_TABLET | Freq: Four times a day (QID) | ORAL | Status: DC | PRN

## 2021-04-10 MED ORDER — SODIUM CHLORIDE 0.9 % IV BOLUS
500.0000 mL | Freq: Once | INTRAVENOUS | Status: AC
  Administered 2021-04-10: 500 mL via INTRAVENOUS

## 2021-04-10 MED ORDER — ONDANSETRON HCL 4 MG PO TABS: 4.0000 mg | ORAL_TABLET | Freq: Four times a day (QID) | ORAL | Status: DC | PRN

## 2021-04-10 MED ORDER — AMLODIPINE BESYLATE 10 MG PO TABS
10.0000 mg | ORAL_TABLET | Freq: Every day | ORAL | Status: DC
  Administered 2021-04-10 – 2021-04-11 (×2): 10 mg via ORAL
  Filled 2021-04-10 (×2): qty 1

## 2021-04-10 MED ORDER — METOPROLOL TARTRATE 50 MG PO TABS
50.0000 mg | ORAL_TABLET | Freq: Every day | ORAL | Status: DC
  Administered 2021-04-10 – 2021-04-11 (×2): 50 mg via ORAL
  Filled 2021-04-10 (×2): qty 1

## 2021-04-10 MED ORDER — ONDANSETRON HCL 4 MG/2ML IJ SOLN: 4.0000 mg | Freq: Four times a day (QID) | INTRAMUSCULAR | Status: DC | PRN

## 2021-04-10 MED ORDER — LEVOTHYROXINE SODIUM 50 MCG PO TABS
75.0000 ug | ORAL_TABLET | Freq: Every day | ORAL | Status: DC
  Administered 2021-04-10 – 2021-04-11 (×2): 75 ug via ORAL
  Filled 2021-04-10 (×2): qty 1

## 2021-04-10 MED ORDER — INSULIN ASPART 100 UNIT/ML ~~LOC~~ SOLN
0.0000 [IU] | SUBCUTANEOUS | Status: DC
Start: 2021-04-10 — End: 2021-04-11
  Administered 2021-04-10: 2 [IU] via SUBCUTANEOUS
  Filled 2021-04-10: qty 0.09

## 2021-04-10 MED ORDER — DORZOLAMIDE HCL-TIMOLOL MAL 2-0.5 % OP SOLN
1.0000 [drp] | Freq: Two times a day (BID) | OPHTHALMIC | Status: DC
  Administered 2021-04-10 (×2): 1 [drp] via OPHTHALMIC
  Filled 2021-04-10: qty 10

## 2021-04-10 MED ORDER — POTASSIUM CHLORIDE CRYS ER 20 MEQ PO TBCR
40.0000 meq | EXTENDED_RELEASE_TABLET | Freq: Once | ORAL | Status: AC
  Administered 2021-04-10: 40 meq via ORAL
  Filled 2021-04-10: qty 2

## 2021-04-10 MED ORDER — ACETAMINOPHEN 650 MG RE SUPP: 650.0000 mg | Freq: Four times a day (QID) | RECTAL | Status: DC | PRN

## 2021-04-10 NOTE — H&P (Signed)
History and Physical    CHALENE TREU EXN:[id number] DOB: 12/31/1935 DOA: 04/10/2021  PCP: [name], NP   Patient coming from: Home   Chief Complaint: "rectal bleeding"   HPI: Alejandra Davis is a 85 y.o. female with medical history significant for dementia, hypothyroidism, hypertension, chronic kidney disease stage IIIa, and recent admission for acute lower GI bleeding, now returning to the emergency department with concern for recurrent rectal bleeding.  History limited by the patient's dementia.  Patient does not remember going home yesterday.  She had told the ED physician that she had reddish blood with a bowel movement just prior to arrival but now stating that she had urinated, saw blood on the toilet paper, did not move her bowels, and is unsure if there was any blood in the toilet bowl.  She denies any abdominal pain, nausea, vomiting, chest pain, or lightheadedness.  She was admitted from 04/05/2021 until 04/09/2021 after several episodes of painless hematochezia, was transfused with 2 units of RBC, underwent colonoscopy notable for diverticula and hemorrhoids, and was discharged home in stable condition after bleeding had apparently resolved.  She is accompanied by her daughter who is unsure if the patient has moved her bowels since discharge and did not see any blood.  ED Course: Upon arrival to the ED, patient is found to be afebrile, saturating well on room air, slightly tachycardic, and with blood pressure 150/90.  Chemistry panel notable for creatinine 1.25 and CBC features a normocytic anemia with hemoglobin 10.7.  Review of Systems:  All other systems reviewed and apart from HPI, are negative.  Past Medical History:  Diagnosis Date  . Chicken pox   . Cystocele   . Diabetes mellitus without complication ([place])   . Glaucoma   . Hypertension   . Hypertensive retinopathy    OU  . Neuromuscular disorder ([place])    Dementia   . Thyroid disease    hypothyroidism    Past  Surgical History:  Procedure Laterality Date  . ABDOMINAL HYSTERECTOMY  1990   TAH BSO  . CATARACT EXTRACTION Bilateral    Dr. [name]  . COLONOSCOPY WITH PROPOFOL N/A 04/08/2021   Procedure: COLONOSCOPY WITH PROPOFOL;  Surgeon: [name], MD;  Location: [facility]COPY;  Service: Endoscopy;  Laterality: N/A;  . EYE SURGERY Bilateral    Cat Sx OU  . OOPHORECTOMY     BSO  . Vaginal Bx     Papilloma    Social History:   reports that she has never smoked. She has never used smokeless tobacco. She reports that she does not drink alcohol and does not use drugs.  Allergies  Allergen Reactions  . Donepezil Hcl     Stomach cramps     Family History  Problem Relation Age of Onset  . Sickle cell anemia Other   . Hypertension Mother   . Cancer Father        LIVER  . Heart disease Brother   . Cancer Brother        STOMACH     Prior to Admission medications   Medication Sig Start Date End Date Taking? Authorizing Provider  amLODipine (NORVASC) 10 MG tablet TAKE 1 TABLET(10 MG) BY MOUTH DAILY Patient taking differently: Take 10 mg by mouth daily. 12/11/20  Yes Dutch Quint B, FNP  aspirin EC 81 MG tablet Take 1 tablet (81 mg total) by mouth daily. 03/25/21  Yes Ngetich, Dinah C, NP  dorzolamide-timolol (COSOPT) 22.3-6.8 MG/ML ophthalmic solution Place 1  drop into both eyes 2 (two) times daily. 10/15/20  Yes [provider]  levothyroxine (SYNTHROID) 75 MCG tablet Take 1 tablet (75 mcg total) by mouth daily. 11/13/20  Yes Ngetich, Dinah C, NP  lisinopril (ZESTRIL) 2.5 MG tablet Take 1 tablet (2.5 mg total) by mouth daily. 01/01/21  Yes Ngetich, Dinah C, NP  loratadine (CLARITIN) 10 MG tablet Take 1 tablet (10 mg total) by mouth daily. Patient taking differently: Take 10 mg by mouth daily as needed. 03/04/21  Yes Ngetich, Dinah C, NP  metFORMIN (GLUCOPHAGE) 500 MG tablet Take 1 tablet (500 mg total) by mouth 2 (two) times daily with a meal. 03/25/21  Yes Ngetich, Dinah C, NP   metoprolol tartrate (LOPRESSOR) 50 MG tablet Take 1 tablet (50 mg total) by mouth daily. 01/01/21  Yes Ngetich, Dinah C, NP  Lancets Ultra Thin 30G MISC 100 each by Does not apply route 3 (three) times a week. 09/18/20   Ngetich, Nelda Bucks, NP    Physical Exam: Vitals:   04/10/21 0017 04/10/21 0055 04/10/21 0058 04/10/21 0230  BP: (!) 145/84 126/60  (!) 150/90  Pulse: (!) 104 (!) 109  (!) 103  Resp: 18 18  16   Temp: 97.6 F (36.4 C) 97.6 F (36.4 C)    TempSrc: Oral Oral    SpO2: 98% 99%  99%  Weight:   56.7 kg   Height:   5\' 3"  (1.6 m)     Constitutional: NAD, calm  Eyes: PERTLA, lids and conjunctivae normal ENMT: Mucous membranes are moist. Posterior pharynx clear of any exudate or lesions.   Neck:  supple, no masses  Respiratory: no wheezing, no crackles. No accessory muscle use.  Cardiovascular: S1 & S2 heard, regular rate and rhythm. No extremity edema.   Abdomen: No distension, no tenderness, soft. Bowel sounds active.  Musculoskeletal: no clubbing / cyanosis. No joint deformity upper and lower extremities.   Skin: no significant rashes, lesions, ulcers. Warm, dry, well-perfused. Neurologic: CN 2-12 grossly intact. Sensation intact. Moving all extremities.  Psychiatric: Alert and oriented to person and place only. Pleasant and cooperative.    Labs and Imaging on Admission: I have personally reviewed following labs and imaging studies  CBC: Recent Labs  Lab 04/05/21 1938 04/05/21 2230 04/06/21 0000 04/06/21 1037 04/06/21 1615 04/09/21 0907 04/10/21 0141  WBC 6.0   < > 8.1 8.2 8.1 5.7 5.7  NEUTROABS 3.4  --   --   --   --   --  3.5  HGB 11.4*   < > 12.3 12.4 11.6* 11.9* 10.7*  HCT 35.0*   < > 36.9 37.6 34.6* 37.0 32.4*  MCV 82.0   < > 84.2 84.3 83.4 86.7 85.5  PLT 232   < > 128* 169 169 211 219   < > = values in this interval not displayed.   Basic Metabolic Panel: Recent Labs  Lab 04/05/21 1938 04/06/21 0518 04/07/21 0515 04/09/21 0907 04/10/21 0141  NA  139 142 138 139 140  K 3.7 3.8 4.1 3.9 3.5  CL 104 111 108 107 108  CO2 27 23 23 25 27   GLUCOSE 122* 105* 86 104* 135*  BUN 20 17 11 12 17   CREATININE 1.31* 0.96 0.90 1.11* 1.25*  CALCIUM 8.8* 8.0* 8.4* 8.9 9.2  MG  --  1.5* 1.8  --   --   PHOS  --   --  2.8  --   --    GFR: Estimated Creatinine Clearance: 28.7 mL/min (  A) (by C-G formula based on SCr of 1.25 mg/dL (H)). Liver Function Tests: Recent Labs  Lab 04/05/21 1938 04/06/21 0518 04/10/21 0141  AST 20 24 22   ALT 18 17 18   ALKPHOS 54 48 57  BILITOT 0.2* 1.0 0.4  PROT 6.6 5.8* 6.8  ALBUMIN 3.2* 3.0* 3.6   Recent Labs  Lab 04/10/21 0141  LIPASE 33   No results for input(s): AMMONIA in the last 168 hours. Coagulation Profile: Recent Labs  Lab 04/05/21 2015  INR 1.0   Cardiac Enzymes: No results for input(s): CKTOTAL, CKMB, CKMBINDEX, TROPONINI in the last 168 hours. BNP (last 3 results) No results for input(s): PROBNP in the last 8760 hours. HbA1C: No results for input(s): HGBA1C in the last 72 hours. CBG: Recent Labs  Lab 04/08/21 1130 04/08/21 1747 04/08/21 2114 04/09/21 0741 04/09/21 1303  GLUCAP 78 119* 110* 93 95   Lipid Profile: No results for input(s): CHOL, HDL, LDLCALC, TRIG, CHOLHDL, LDLDIRECT in the last 72 hours. Thyroid Function Tests: No results for input(s): TSH, T4TOTAL, FREET4, T3FREE, THYROIDAB in the last 72 hours. Anemia Panel: No results for input(s): VITAMINB12, FOLATE, FERRITIN, TIBC, IRON, RETICCTPCT in the last 72 hours. Urine analysis:    Component Value Date/Time   COLORURINE YELLOW 09/16/2019 1354   APPEARANCEUR HAZY (A) 09/16/2019 1354   LABSPEC 1.021 09/16/2019 1354   PHURINE 6.0 09/16/2019 1354   GLUCOSEU NEGATIVE 09/16/2019 1354   GLUCOSEU NEGATIVE 03/13/2019 1345   HGBUR NEGATIVE 09/16/2019 1354   BILIRUBINUR Negative 01/01/2021 0946   KETONESUR 5 (A) 09/16/2019 1354   PROTEINUR Positive (A) 01/01/2021 0946   PROTEINUR 30 (A) 09/16/2019 1354   UROBILINOGEN  negative (A) 01/01/2021 0946   UROBILINOGEN 0.2 03/13/2019 1345   NITRITE Negative 01/01/2021 0946   NITRITE NEGATIVE 09/16/2019 1354   LEUKOCYTESUR Large (3+) (A) 01/01/2021 0946   LEUKOCYTESUR MODERATE (A) 09/16/2019 1354   Sepsis Labs: @LABRCNTIP (procalcitonin:4,lacticidven:4) ) Recent Results (from the past 240 hour(s))  Resp Panel by RT-PCR (Flu A&B, Covid) Nasopharyngeal Swab     Status: None   Collection Time: 04/05/21  7:46 PM   Specimen: Nasopharyngeal Swab; Nasopharyngeal(NP) swabs in vial transport medium  Result Value Ref Range Status   SARS Coronavirus 2 by RT PCR NEGATIVE NEGATIVE Final    Comment: (NOTE) SARS-CoV-2 target nucleic acids are NOT DETECTED.  The SARS-CoV-2 RNA is generally detectable in upper respiratory specimens during the acute phase of infection. The lowest concentration of SARS-CoV-2 viral copies this assay can detect is 138 copies/mL. A negative result does not preclude SARS-Cov-2 infection and should not be used as the sole basis for treatment or other patient management decisions. A negative result may occur with  improper specimen collection/handling, submission of specimen other than nasopharyngeal swab, presence of viral mutation(s) within the areas targeted by this assay, and inadequate number of viral copies(<138 copies/mL). A negative result must be combined with clinical observations, patient history, and epidemiological information. The expected result is Negative.  Fact Sheet for Patients:  EntrepreneurPulse.com.au  Fact Sheet for Healthcare Providers:  IncredibleEmployment.be  This test is no t yet approved or cleared by the [place] FDA and  has been authorized for detection and/or diagnosis of SARS-CoV-2 by FDA under an Emergency Use Authorization (EUA). This EUA will remain  in effect (meaning this test can be used) for the duration of the COVID-19 declaration under Section 564(b)(1) of  the Act, 21 U.S.C.section 360bbb-3(b)(1), unless the authorization is terminated  or revoked sooner.  Influenza A by PCR NEGATIVE NEGATIVE Final   Influenza B by PCR NEGATIVE NEGATIVE Final    Comment: (NOTE) The Xpert Xpress SARS-CoV-2/FLU/RSV plus assay is intended as an aid in the diagnosis of influenza from Nasopharyngeal swab specimens and should not be used as a sole basis for treatment. Nasal washings and aspirates are unacceptable for Xpert Xpress SARS-CoV-2/FLU/RSV testing.  Fact Sheet for Patients: EntrepreneurPulse.com.au  Fact Sheet for Healthcare Providers: IncredibleEmployment.be  This test is not yet approved or cleared by the [place] FDA and has been authorized for detection and/or diagnosis of SARS-CoV-2 by FDA under an Emergency Use Authorization (EUA). This EUA will remain in effect (meaning this test can be used) for the duration of the COVID-19 declaration under Section 564(b)(1) of the Act, 21 U.S.C. section 360bbb-3(b)(1), unless the authorization is terminated or revoked.  Performed at [facility], [place] [place], [place] [address]   Culture, blood (routine x 2)     Status: None (Preliminary result)   Collection Time: 04/06/21  7:28 AM   Specimen: BLOOD  Result Value Ref Range Status   Specimen Description   Final    BLOOD RIGHT ANTECUBITAL Performed at [facility] [address], [place], [place] [address]    Special Requests   Final    BOTTLES DRAWN AEROBIC AND ANAEROBIC Blood Culture adequate volume Performed at [facility] [address] [place], [place] [address]    Culture   Final    NO GROWTH 3 DAYS Performed at [facility], [place] [address] [place], [place] [address]    Report Status PENDING  Incomplete  Culture, blood (routine x 2)     Status: None (Preliminary result)   Collection Time: 04/06/21  7:28 AM   Specimen:  BLOOD  Result Value Ref Range Status   Specimen Description   Final    BLOOD LEFT ANTECUBITAL Performed at [facility] [address] [place], [place] [address]    Special Requests   Final    BOTTLES DRAWN AEROBIC AND ANAEROBIC Blood Culture adequate volume Performed at [facility] [address] [place], [place] [address]    Culture   Final    NO GROWTH 3 DAYS Performed at [facility], [place] [address] [place], [place] [address]    Report Status PENDING  Incomplete     Radiological Exams on Admission: No results found.  Assessment/Plan   1. Lower GI bleeding; anemia  - Patient admitted from 4/9 to 4/13 with likely diverticular bleeding requiring 2 units RBC returns with reports of recurrent bleeding at home  - History difficult to patient's dementia; no bleeding since arrival in ED 4 hours ago  - Hgb is 10.7, down from 11.9 <24 hrs earlier  - She is hemodynamically stable, denies pain or N/V  - Type and screen, hold ASA, trend H&H    2. CKD IIIa  - SCr is 1.25 on admission, up from 1.13 the day before and 0.9 on 4/11  - Hold lisinopril, renally-dose medications, repeat chem panel in am    3. Hypertension  - Continue Norvasc and metoprolol, hold lisinopril initially as above   4. Hypothyroidism  - Continue Synthroid    5. Dementia  - Continue supportive care, institute delirium precautions    6. Embolic phenomenon  - Numerous hypoenhancing b/l renal cortical lesions noted incidentally on CT during recent admission with no atrial fibrillation noted  and no etiology identified on echo  - Continue with outpatient vascular follow-up as planned     DVT prophylaxis: SCDs  Code Status: Full  Level of Care: Level of care: Telemetry Family Communication: Daughter updated at bedside Disposition Plan:  Patient is from: home  Anticipated d/c is to: Home  Anticipated d/c date is: 04/11/21 Patient currently: Pending repeat H&H,  monitoring for bleed  Consults called: None  Admission status: Observation     Vianne Bulls, MD [facility]  04/10/2021, 4:33 AM

## 2021-04-10 NOTE — ED Provider Notes (Signed)
[facility] DEPT Provider Note  CSN: [id number] Arrival date & time: 04/10/21 0002  Chief Complaint(s) Rectal Bleeding  HPI Alejandra Davis is a 85 y.o. female with a past medical history listed below including diverticulosis discharged today after lower GI bleed.  Patient had a colonoscopy 2 days ago revealing numerous small and large mouth diverticula as well as small amount of internal hemorrhoids.  She returns today for about reddis blood bowel movement just prior to arrival.  Patient reports that it was a significant amount.  She denies any abdominal pain.  She is not anticoagulated.  Denies any lightheaded or dizziness.  Denies any other physical complaints.  HPI  Past Medical History Past Medical History:  Diagnosis Date  . Chicken pox   . Cystocele   . Diabetes mellitus without complication ([place])   . Glaucoma   . Hypertension   . Hypertensive retinopathy    OU  . Neuromuscular disorder ([place])    Dementia   . Thyroid disease    hypothyroidism   Patient Active Problem List   Diagnosis Date Noted  . Chronic kidney disease, stage 3a ([place]) 04/06/2021  . Late onset Alzheimer's dementia without behavioral disturbance ([place]) 04/06/2021  . Sinus tachycardia 04/06/2021  . GI bleed 04/06/2021  . Acute lower GI bleeding 04/05/2021  . Stage 3b chronic kidney disease ([place]) 09/17/2020  . Hyperlipidemia associated with type 2 diabetes mellitus ([place]) 09/17/2020  . Flexural atopic dermatitis 05/30/2019  . Presbycusis of both ears 05/11/2019  . Acquired hypothyroidism 03/13/2019  . Murmur 09/01/2018  . Telogen effluvium 06/07/2018  . Eczema of scalp 03/31/2018  . Xerosis cutis 02/25/2018  . Immunization reaction 02/08/2018  . Keratosis pilaris 02/08/2018  . Health care maintenance 02/07/2018  . Controlled type 2 diabetes mellitus without complication, without long-term current use of insulin ([place]) 02/07/2018  . Cystocele   . Osteopenia   . Essential  hypertension    Home Medication(s) Prior to Admission medications   Medication Sig Start Date End Date Taking? Authorizing Provider  amLODipine (NORVASC) 10 MG tablet TAKE 1 TABLET(10 MG) BY MOUTH DAILY Patient taking differently: Take 10 mg by mouth daily. 12/11/20  Yes Dutch Quint B, FNP  aspirin EC 81 MG tablet Take 1 tablet (81 mg total) by mouth daily. 03/25/21  Yes Ngetich, Dinah C, NP  dorzolamide-timolol (COSOPT) 22.3-6.8 MG/ML ophthalmic solution Place 1 drop into both eyes 2 (two) times daily. 10/15/20  Yes [provider]  levothyroxine (SYNTHROID) 75 MCG tablet Take 1 tablet (75 mcg total) by mouth daily. 11/13/20  Yes Ngetich, Dinah C, NP  lisinopril (ZESTRIL) 2.5 MG tablet Take 1 tablet (2.5 mg total) by mouth daily. 01/01/21  Yes Ngetich, Dinah C, NP  loratadine (CLARITIN) 10 MG tablet Take 1 tablet (10 mg total) by mouth daily. Patient taking differently: Take 10 mg by mouth daily as needed. 03/04/21  Yes Ngetich, Dinah C, NP  metFORMIN (GLUCOPHAGE) 500 MG tablet Take 1 tablet (500 mg total) by mouth 2 (two) times daily with a meal. 03/25/21  Yes Ngetich, Dinah C, NP  metoprolol tartrate (LOPRESSOR) 50 MG tablet Take 1 tablet (50 mg total) by mouth daily. 01/01/21  Yes Ngetich, Dinah C, NP  Lancets Ultra Thin 30G MISC 100 each by Does not apply route 3 (three) times a week. 09/18/20   Ngetich, Nelda Bucks, NP  Past Surgical History Past Surgical History:  Procedure Laterality Date  . ABDOMINAL HYSTERECTOMY  1990   TAH BSO  . CATARACT EXTRACTION Bilateral    Dr. [name]  . COLONOSCOPY WITH PROPOFOL N/A 04/08/2021   Procedure: COLONOSCOPY WITH PROPOFOL;  Surgeon: [name], MD;  Location: [facility];  Service: Endoscopy;  Laterality: N/A;  . EYE SURGERY Bilateral    Cat Sx OU  . OOPHORECTOMY     BSO  . Vaginal Bx     Papilloma   Family  History Family History  Problem Relation Age of Onset  . Sickle cell anemia Other   . Hypertension Mother   . Cancer Father        LIVER  . Heart disease Brother   . Cancer Brother        STOMACH    Social History Social History   Tobacco Use  . Smoking status: Never Smoker  . Smokeless tobacco: Never Used  Vaping Use  . Vaping Use: Never used  Substance Use Topics  . Alcohol use: No  . Drug use: No   Allergies Donepezil hcl  Review of Systems Review of Systems All other systems are reviewed and are negative for acute change except as noted in the HPI  Physical Exam Vital Signs  I have reviewed the triage vital signs BP (!) 145/84 (BP Location: Left Arm)   Pulse (!) 104   Temp 97.6 F (36.4 C) (Oral)   Resp 18   SpO2 98%   Physical Exam Vitals reviewed. Exam conducted with a chaperone present.  Constitutional:      General: She is not in acute distress.    Appearance: She is well-developed. She is not diaphoretic.  HENT:     Head: Normocephalic and atraumatic.     Right Ear: External ear normal.     Left Ear: External ear normal.     Nose: Nose normal.  Eyes:     General: No scleral icterus.    Conjunctiva/sclera: Conjunctivae normal.  Neck:     Trachea: Phonation normal.  Cardiovascular:     Rate and Rhythm: Normal rate and regular rhythm.  Pulmonary:     Effort: Pulmonary effort is normal. No respiratory distress.     Breath sounds: No stridor.  Abdominal:     General: There is no distension.  Genitourinary:    Comments: Daughter at bedside for the exam. Grossly positive DRE for blood. Musculoskeletal:        General: Normal range of motion.     Cervical back: Normal range of motion.  Neurological:     Mental Status: She is alert and oriented to person, place, and time.  Psychiatric:        Behavior: Behavior normal.     ED Results and Treatments Labs (all labs ordered are listed, but only abnormal results are displayed) Labs Reviewed   CBC WITH DIFFERENTIAL/PLATELET - Abnormal; Notable for the following components:      Result Value   RBC 3.79 (*)    Hemoglobin 10.7 (*)    HCT 32.4 (*)    RDW 16.5 (*)    All other components within normal limits  COMPREHENSIVE METABOLIC PANEL - Abnormal; Notable for the following components:   Glucose, Bld 135 (*)    Creatinine, Ser 1.25 (*)    GFR, Estimated 43 (*)    All other components within normal limits  SARS CORONAVIRUS 2 (TAT 6-24 HRS)  LIPASE, BLOOD  EKG  EKG Interpretation  Date/Time:    Ventricular Rate:    PR Interval:    QRS Duration:   QT Interval:    QTC Calculation:   R Axis:     Text Interpretation:        Radiology No results found.  Pertinent labs & imaging results that were available during my care of the patient were reviewed by me and considered in my medical decision making (see chart for details).  Medications Ordered in ED Medications - No data to display                                                                                                                                  Procedures Procedures  (including critical care time)  Medical Decision Making / ED Course I have reviewed the nursing notes for this encounter and the patient's prior records (if available in EHR or on provided paperwork).   Alejandra Davis was evaluated in Emergency Department on 04/10/2021 for the symptoms described in the history of present illness. She was evaluated in the context of the global COVID-19 pandemic, which necessitated consideration that the patient might be at risk for infection with the SARS-CoV-2 virus that causes COVID-19. Institutional protocols and algorithms that pertain to the evaluation of patients at risk for COVID-19 are in a state of rapid change based on information released by regulatory bodies including the CDC and  federal and state organizations. These policies and algorithms were followed during the patient's care in the ED.  Recurrent GI bleed likely diverticular. CBC with 1 g drop in her hemoglobin within 24 hours. Metabolic panel without significant electrolyte derangements. Mild renal sufficiency.   Patient required 2 units of packed red blood cells during previous admission.  We will plan to admit for observation given her high risk for bleeds.      Final Clinical Impression(s) / ED Diagnoses Final diagnoses:  Lower GI bleed      This chart was dictated using voice recognition software.  Despite best efforts to proofread,  errors can occur which can change the documentation meaning.   Fatima Blank, MD 04/10/21 [phone]

## 2021-04-10 NOTE — ED Triage Notes (Signed)
Patient just d/c'd from [facility] yesterday d/t diverticulosis. She had a colonoscopy while here which was unremarkable except for hemorrhoids on inside/outside. She is still having rectal bleeding and concerned about this. She was having pain when she was admitted on Saturday but she is not having no pain at this time.

## 2021-04-10 NOTE — Progress Notes (Signed)
Nutrition Education Note  RD consulted for nutrition education regarding nutrition management for diverticulosis.   RD provided "High Fiber Nutrition Therapy" handout  from the Academy of [employer] and Dietetics. Reviewed patient's dietary recall and discussed ways for pt to meet nutrition goals over the next several weeks. Explained reasons for pt to increase fiber in diet slowly. Reviewed low fiber foods and high fiber foods. Discussed best practice for long term management of diverticulosis is a high fiber diet and discussed ways to gradually increase fiber in the diet.   Teach back method used. Pt verbalizes understanding of information provided.   Expect fair compliance. Pt's daughter was the one to request education. Per pt, daughter is at work. Spoke with pt about diet and left handout for daughter to review when she visits.  Pt reports not having any constipation issues. Has not had any further bleeding since admission. States she mainly consumes bacon and eggs for breakfast and will eat fried or baked chicken with potatoes for meals. Pt aware she should increase her fruit and vegetable intake and limit red and processed meats. Encouraged increased fluid intakes as well.   Body mass index is Body mass index is 20.78 kg/m.Marland Kitchen Pt meets criteria for normal based on current BMI.  Current diet order is HH/CHO modified. Received breakfast after visit. Labs and medications reviewed. No further nutrition interventions warranted at this time.  If additional nutrition issues arise, please re-consult RD.  Clayton Bibles, MS, RD, LDN Inpatient Clinical Dietitian Contact information available via Amion

## 2021-04-10 NOTE — ED Notes (Signed)
ED TO INPATIENT HANDOFF REPORT  Name/Age/Gender Alejandra Davis 85 y.o. female  Code Status    Code Status Orders  (From admission, onward)         Start     Ordered   04/10/21 0429  Full code  Continuous        04/10/21 0431        Code Status History    Date Active Date Inactive Code Status Order ID Comments User Context   04/06/2021 0027 04/09/2021 1943 Full Code [id number]  Vernelle Emerald, MD ED   Advance Care Planning Activity      Home/SNF/Other Home   Chief Complaint Rectal bleeding [K62.5]  Level of Care/Admitting Diagnosis ED Disposition    ED Disposition Condition Comment   Admit  Hospital Area: [facility] [[id number]]  Level of Care: Telemetry [5]  Admit to tele based on following criteria: Other see comments  Comments: acute GI bleeding  Covid Evaluation: Asymptomatic Screening Protocol (No Symptoms)  Diagnosis: Rectal bleeding [962836]  Admitting Physician: Vianne Bulls [[id number]]  Attending Physician: Vianne Bulls [[id number]]       Medical History Past Medical History:  Diagnosis Date  . Chicken pox   . Cystocele   . Diabetes mellitus without complication ([place])   . Glaucoma   . Hypertension   . Hypertensive retinopathy    OU  . Neuromuscular disorder ([place])    Dementia   . Thyroid disease    hypothyroidism    Allergies Allergies  Allergen Reactions  . Donepezil Hcl     Stomach cramps     IV Location/Drains/Wounds Patient Lines/Drains/Airways Status    Active Line/Drains/Airways    Name Placement date Placement time Site Days   Peripheral IV 04/10/21 Right Antecubital 04/10/21  0143  Antecubital  less than 1          Labs/Imaging Results for orders placed or performed during the hospital encounter of 04/10/21 (from the past 48 hour(s))  CBC with Differential/Platelet     Status: Abnormal   Collection Time: 04/10/21  1:41 AM  Result Value Ref Range   WBC 5.7 4.0 - 10.5 K/uL   RBC 3.79 (L) 3.87 - 5.11  MIL/uL   Hemoglobin 10.7 (L) 12.0 - 15.0 g/dL   HCT 32.4 (L) 36.0 - 46.0 %   MCV 85.5 80.0 - 100.0 fL   MCH 28.2 26.0 - 34.0 pg   MCHC 33.0 30.0 - 36.0 g/dL   RDW 16.5 (H) 11.5 - 15.5 %   Platelets 219 150 - 400 K/uL   nRBC 0.0 0.0 - 0.2 %   Neutrophils Relative % 61 %   Neutro Abs 3.5 1.7 - 7.7 K/uL   Lymphocytes Relative 18 %   Lymphs Abs 1.0 0.7 - 4.0 K/uL   Monocytes Relative 11 %   Monocytes Absolute 0.6 0.1 - 1.0 K/uL   Eosinophils Relative 9 %   Eosinophils Absolute 0.5 0.0 - 0.5 K/uL   Basophils Relative 0 %   Basophils Absolute 0.0 0.0 - 0.1 K/uL   Immature Granulocytes 1 %   Abs Immature Granulocytes 0.03 0.00 - 0.07 K/uL    Comment: Performed at [facility], [place] [address], [place], [place] [address]  Comprehensive metabolic panel     Status: Abnormal   Collection Time: 04/10/21  1:41 AM  Result Value Ref Range   Sodium 140 135 - 145 mmol/L   Potassium 3.5 3.5 - 5.1 mmol/L   Chloride  108 98 - 111 mmol/L   CO2 27 22 - 32 mmol/L   Glucose, Bld 135 (H) 70 - 99 mg/dL    Comment: Glucose reference range applies only to samples taken after fasting for at least 8 hours.   BUN 17 8 - 23 mg/dL   Creatinine, Ser 1.25 (H) 0.44 - 1.00 mg/dL   Calcium 9.2 8.9 - 10.3 mg/dL   Total Protein 6.8 6.5 - 8.1 g/dL   Albumin 3.6 3.5 - 5.0 g/dL   AST 22 15 - 41 U/L   ALT 18 0 - 44 U/L   Alkaline Phosphatase 57 38 - 126 U/L   Total Bilirubin 0.4 0.3 - 1.2 mg/dL   GFR, Estimated 43 (L) >60 mL/min    Comment: (NOTE) Calculated using the CKD-EPI Creatinine Equation (2021)    Anion gap 5 5 - 15    Comment: Performed at [facility], [place] [address] [place], [place] [address]  Lipase, blood     Status: None   Collection Time: 04/10/21  1:41 AM  Result Value Ref Range   Lipase 33 11 - 51 U/L    Comment: Performed at [facility], [place] [address], [place], [place] [address]   No results found.  Pending Labs [employer]  (From admission, onward)          Start     Ordered   04/10/21 5053  Basic metabolic panel  Daily,   R      04/10/21 0431   04/10/21 0431  Hematocrit  Now then every 8 hours,   R (with STAT occurrences)      04/10/21 0431   04/10/21 0430  Hemoglobin  Now then every 8 hours,   R (with STAT occurrences)      04/10/21 0431   04/10/21 0428  Type and screen [place]  Once,   STAT       Comments: [place]    04/10/21 0431   04/10/21 0045  SARS CORONAVIRUS 2 (TAT 6-24 HRS) Nasopharyngeal Nasopharyngeal Swab  (Tier 3 - Symptomatic/asymptomatic)  Once,   STAT       Question Answer Comment  Is this test for diagnosis or screening Screening   Symptomatic for COVID-19 as defined by CDC No   Hospitalized for COVID-19 No   Admitted to ICU for COVID-19 No   Previously tested for COVID-19 Yes   Resident in a congregate (group) care setting No   Employed in healthcare setting No   Pregnant No   Has patient completed COVID vaccination(s) (2 doses of Pfizer/Moderna 1 dose of [employer]) Yes   Has patient completed COVID Booster / 3rd dose No      04/10/21 0045          Vitals/Pain Today's Vitals   04/10/21 0056 04/10/21 0058 04/10/21 0230 04/10/21 0500  BP:   (!) 150/90 131/89  Pulse:   (!) 103 (!) 105  Resp:   16 14  Temp:      TempSrc:      SpO2:   99% 98%  Weight:  56.7 kg    Height:  5\' 3"  (1.6 m)    PainSc: 0-No pain       Isolation Precautions No active isolations  Medications Medications  amLODipine (NORVASC) tablet 10 mg (has no administration in time range)  metoprolol tartrate (LOPRESSOR) tablet 50 mg (has no administration in time range)  levothyroxine (SYNTHROID) tablet 75 mcg (  has no administration in time range)  dorzolamide-timolol (COSOPT) 22.3-6.8 MG/ML ophthalmic solution 1 drop (has no administration in time range)  sodium chloride 0.9 % bolus 500 mL (has no administration in time range)  acetaminophen  (TYLENOL) tablet 650 mg (has no administration in time range)    Or  acetaminophen (TYLENOL) suppository 650 mg (has no administration in time range)  ondansetron (ZOFRAN) tablet 4 mg (has no administration in time range)    Or  ondansetron (ZOFRAN) injection 4 mg (has no administration in time range)  insulin aspart (novoLOG) injection 0-9 Units (has no administration in time range)    Mobility Walks with assistance

## 2021-04-10 NOTE — Progress Notes (Signed)
PROGRESS NOTE  Brief Narrative: Alejandra Davis is an 85 y.o. female with a history inclusive of dementia, CKD IIIa, NIDT2DM, and recent admission for transfusion-dependent GI bleeding suspected source diverticulosis. She was discharged 4/13 after complete GI work up revealing no source of bleeding on tagged RBC scan, no active bleeding during EGD or colonoscopy, and resolution to clinical bleeding. About 5 hours after returning home, having had a couple meals, the patient reported wiping red after urinating. Unclear whether there was stool, though there's been no other reports of bleeding. They returned to the ED where hgb was 10.6 (from 11.9 at discharge). VSS. She was admitted for observation this morning by Dr. Myna Hidalgo.    Subjective: Confused, but denies pain anywhere. No bleeding that she's noticed here.  Objective: BP (!) 159/91 (BP Location: Left Arm)   Pulse 75   Temp 98.4 F (36.9 C) (Oral)   Resp 14   Ht 5\' 3"  (1.6 m)   Wt 53.2 kg   SpO2 100%   BMI 20.78 kg/m   Gen: Alert, conversant elderly female in no distress Pulm: Clear and nonlabored on room air  CV: RRR, no murmur, no JVD, no edema GI: Soft, NT, ND, +BS  Neuro: Incompletely oriented. No focal deficits. Skin: No rashes, lesions or ulcers on visualized skin  Assessment & Plan: Principal Problem:   Rectal bleeding Active Problems:   Essential hypertension   Controlled type 2 diabetes mellitus without complication, without long-term current use of insulin (HCC)   Acquired hypothyroidism   Chronic kidney disease, stage 3a ([place])   Late onset Alzheimer's dementia without behavioral disturbance ([facility])   Lower GI bleed  Hematochezia, suspected diverticular bleed:  - Holding ASA - If brisk bleeding appears, check tagged RBC scan - Monitor H/H, stable since arrival. T&S performed. - ?hematuria based on pt report (reliability is suboptimal due to dementia) > check UA - Dietitian consulted for resource provision regarding  appropriate diet for diverticulosis. - Will advance diet given overall stability - Discussed plan with daughter by phone this AM. Hopeful for discharge 4/15 if no bleeding, hgb and vital signs stable.  Hypokalemia:  - Supplement K, CrCl near baseline.  Other plan per H&P. Will need vascular surgery follow up as previous planned.  Patrecia Pour, MD Pager on amion 04/10/2021, 9:17 AM

## 2021-04-11 DIAGNOSIS — I10 Essential (primary) hypertension: Secondary | ICD-10-CM | POA: Diagnosis not present

## 2021-04-11 DIAGNOSIS — E039 Hypothyroidism, unspecified: Secondary | ICD-10-CM | POA: Diagnosis not present

## 2021-04-11 DIAGNOSIS — F0280 Dementia in other diseases classified elsewhere without behavioral disturbance: Secondary | ICD-10-CM | POA: Diagnosis not present

## 2021-04-11 DIAGNOSIS — G301 Alzheimer's disease with late onset: Secondary | ICD-10-CM | POA: Diagnosis not present

## 2021-04-11 DIAGNOSIS — K922 Gastrointestinal hemorrhage, unspecified: Secondary | ICD-10-CM | POA: Diagnosis not present

## 2021-04-11 DIAGNOSIS — E119 Type 2 diabetes mellitus without complications: Secondary | ICD-10-CM | POA: Diagnosis not present

## 2021-04-11 DIAGNOSIS — K625 Hemorrhage of anus and rectum: Secondary | ICD-10-CM | POA: Diagnosis not present

## 2021-04-11 DIAGNOSIS — N1831 Chronic kidney disease, stage 3a: Secondary | ICD-10-CM | POA: Diagnosis not present

## 2021-04-11 LAB — CULTURE, BLOOD (ROUTINE X 2)
Culture: NO GROWTH
Culture: NO GROWTH
Special Requests: ADEQUATE
Special Requests: ADEQUATE

## 2021-04-11 LAB — GLUCOSE, CAPILLARY
Glucose-Capillary: 112 mg/dL — ABNORMAL HIGH (ref 70–99)
Glucose-Capillary: 132 mg/dL — ABNORMAL HIGH (ref 70–99)
Glucose-Capillary: 116 mg/dL — ABNORMAL HIGH (ref 70–99)
Glucose-Capillary: 112 mg/dL — ABNORMAL HIGH (ref 70–99)

## 2021-04-11 LAB — HEMOGLOBIN AND HEMATOCRIT, BLOOD
HCT: 32.8 % — ABNORMAL LOW (ref 36.0–46.0)
Hemoglobin: 10.9 g/dL — ABNORMAL LOW (ref 12.0–15.0)

## 2021-04-11 NOTE — Progress Notes (Signed)
Pt discharged to home. DC instructions given to daughter at bedside. No concerns voiced. Pt left unit in wheelchair pushed by [facility], RN accompanied by daughter. No concerns voiced.

## 2021-04-11 NOTE — Care Management Obs Status (Signed)
[facility] NOTIFICATION   Patient Details  Name: RABECKA BRENDEL MRN: [id number] Date of Birth: 11-17-1936   Medicare Observation Status Notification Given:  Yes  Patient with Dementia. Left copy in room for family.  Lynnell Catalan, RN 04/11/2021, 9:21 AM

## 2021-04-11 NOTE — Discharge Summary (Signed)
Physician Discharge Summary  Alejandra Davis IWL:[id number] DOB: [birth year] DOA: 04/10/2021  PCP: [name], NP  Admit date: 04/10/2021 Discharge date: 04/11/2021  Admitted From: Home Disposition: Home   Recommendations for Outpatient Follow-up:  1. Follow up with PCP in 1-2 weeks for recheck [place]: None Equipment/Devices: None Discharge Condition: Stable CODE STATUS: Full Diet recommendation: High fiber  Brief/Interim Summary: Alejandra Davis is an 85 y.o. female with a history inclusive of dementia, CKD IIIa, NIDT2DM, and recent admission for transfusion-dependent GI bleeding suspected source diverticulosis. She was discharged 4/13 after complete GI work up revealing no source of bleeding on tagged RBC scan, no active bleeding during EGD or colonoscopy, and resolution to clinical bleeding. About 5 hours after returning home, having had a couple meals, the patient reported wiping red after urinating. Unclear whether there was stool, though there's been no other reports of bleeding. They returned to the ED where hgb was 10.6 (from 11.9 at discharge). VSS. She was admitted for observation, during which the patient had no bleeding and stable hgb.  Discharge Diagnoses:  Principal Problem:   Rectal bleeding Active Problems:   Essential hypertension   Controlled type 2 diabetes mellitus without complication, without long-term current use of insulin (HCC)   Acquired hypothyroidism   Chronic kidney disease, stage 3a ([place])   Late onset Alzheimer's dementia without behavioral disturbance (HCC)   Lower GI bleed  Hematochezia, suspected diverticular bleed:  - Holding ASA (risk > potential benefit at this time) - Monitor H/H at follow up. Hgb 10.9g/dl at discharge. - High fiber diet  Hypokalemia:  - Supplemented K, CrCl near baseline.  Discharge Instructions Discharge Instructions    Discharge instructions   Complete by: As directed    You were observed for reports of GI  bleeding which have not recurred. Your previous work up revealed diverticulosis and internal hemorrhoids which may contribute to wiping red blood after BM. Since this has not happened again and your blood counts are stable (actually slightly improving) during your time here, you are cleared for discharge. To reduce risk of bleeding until you follow up with your primary doctor, you should not take any blood thinners or aspirin until that time. Follow a high fiber diet to reduce the risk of getting more diverticulosis. Seek medical attention again if you notice significant or prolonged bleeding.     Allergies as of 04/11/2021      Reactions   Donepezil Hcl    Stomach cramps       Medication List    STOP taking these medications   aspirin EC 81 MG tablet     TAKE these medications   amLODipine 10 MG tablet Commonly known as: NORVASC TAKE 1 TABLET(10 MG) BY MOUTH DAILY What changed: See the new instructions.   dorzolamide-timolol 22.3-6.8 MG/ML ophthalmic solution Commonly known as: COSOPT Place 1 drop into both eyes 2 (two) times daily.   Lancets Ultra Thin 30G Misc 100 each by Does not apply route 3 (three) times a week.   levothyroxine 75 MCG tablet Commonly known as: SYNTHROID Take 1 tablet (75 mcg total) by mouth daily.   lisinopril 2.5 MG tablet Commonly known as: Zestril Take 1 tablet (2.5 mg total) by mouth daily.   loratadine 10 MG tablet Commonly known as: CLARITIN Take 1 tablet (10 mg total) by mouth daily. What changed:   when to take this  reasons to take this   metFORMIN 500 MG tablet Commonly known as:  GLUCOPHAGE Take 1 tablet (500 mg total) by mouth 2 (two) times daily with a meal.   metoprolol tartrate 50 MG tablet Commonly known as: LOPRESSOR Take 1 tablet (50 mg total) by mouth daily.       Follow-up Information    Ngetich, Dinah C, NP. Schedule an appointment as soon as possible for a visit in 1 week(s).   Specialty: Family Medicine Contact  information: [place] [address] [phone]              Allergies  Allergen Reactions  . Donepezil Hcl     Stomach cramps     Consultations:  None  Procedures/Studies: CT HEAD WO CONTRAST  Result Date: 04/06/2021 CLINICAL DATA:  Change in mental status.  Hematochezia EXAM: CT HEAD WITHOUT CONTRAST TECHNIQUE: Contiguous axial images were obtained from the base of the skull through the vertex without intravenous contrast. COMPARISON:  Brain MRI 05/02/2019 FINDINGS: Brain: No evidence of acute infarction, hemorrhage, hydrocephalus, extra-axial collection or mass lesion/mass effect. Cerebral and especially cerebellar atrophy. Chronic small vessel ischemia throughout the cerebral white matter. Chronic lacunar infarct at the right pons. Vascular: No hyperdense vessel or unexpected calcification. Skull: Normal. Negative for fracture or focal lesion. Sinuses/Orbits: No acute finding. IMPRESSION: No acute finding. Atrophy and extensive chronic small vessel ischemia. Electronically Signed   By: Monte Fantasia M.D.   On: 04/06/2021 08:58   CT ANGIO CHEST PE W OR WO CONTRAST  Result Date: 04/06/2021 CLINICAL DATA:  Chest pain and altered mental status. EXAM: CT ANGIOGRAPHY CHEST WITH CONTRAST TECHNIQUE: Multidetector CT imaging of the chest was performed using the standard protocol during bolus administration of intravenous contrast. Multiplanar CT image reconstructions and MIPs were obtained to evaluate the vascular anatomy. CONTRAST:  17mL OMNIPAQUE IOHEXOL 350 MG/ML SOLN COMPARISON:  CT angiogram abdomen and pelvis including lower lung regions April 05, 2021 FINDINGS: There are scattered foci calcification in visualized great vessels. There are multiple foci of aortic atherosclerosis. There is an area of apparent plaque ulceration along the leftward aspect of the distal aorta near the gastroesophageal junction. The focus of plaque ulceration measures 1.8 x 0.7 cm. There is less  than 50% diameter narrowing of the distal descending thoracic aorta in this area. No other plaque ulceration evident on this study. No pericardial effusion or pericardial thickening. There are foci of aortic atherosclerosis. There are foci of coronary artery calcification. Mediastinum/Nodes: Thyroid appears unremarkable. No evident thoracic adenopathy. No appreciable esophageal lesions. Lungs/Pleura: There is scarring in the extreme lung apices. There is mild bibasilar atelectasis. There is no edema or airspace opacity. No pleural effusions. No pneumothorax. Trachea and major bronchial structures appear patent. Upper Abdomen: There are gallstones within the gallbladder. Contrast is seen within the gallbladder from recent CT angiogram abdomen and pelvis. Cyst arising from left kidney laterally measuring 1.8 x 1.7 cm noted. Musculoskeletal: No blastic or lytic bone lesions. Degenerative change noted in thoracic spine. There are no chest wall lesions. Review of the MIP images confirms the above findings. IMPRESSION: 1. Penetrating focal ulceration along the leftward aspect of the descending thoracic aorta near the gastroesophageal junction level, stable. No similar changes elsewhere. No frank dissection. Moderate atherosclerotic plaque in this region of ulceration noted. No thoracic aortic aneurysm. 2.  No evident pulmonary embolus. 3. Bibasilar atelectasis. No edema or airspace opacity. No pleural effusions. 4.  Cholelithiasis. 5. There are foci of great vessel and coronary artery calcification. Aortic Atherosclerosis (ICD10-I70.0). Electronically Signed   By:  Lowella Grip III M.D.   On: 04/06/2021 09:21   NM GI Blood Loss  Result Date: 04/06/2021 CLINICAL DATA:  GI bleed EXAM: NUCLEAR MEDICINE GASTROINTESTINAL BLEEDING SCAN TECHNIQUE: Sequential abdominal images were obtained following intravenous administration of Tc-68m labeled red blood cells. RADIOPHARMACEUTICALS:  22 mCi Tc-1m pertechnetate in-vitro  labeled red cells. COMPARISON:  CT 04/05/2021 FINDINGS: There is excessive motion on multiple images. Imaging terminated slightly prematurely secondary to concerns over patient safety and motion. No definitive foci of tracer extravasation or static or dynamic foci of activity. IMPRESSION: Limited study secondary to excessive patient motion. No definite acute GI bleeding on current exam. Electronically Signed   By: Donavan Foil M.D.   On: 04/06/2021 03:55   ECHOCARDIOGRAM COMPLETE  Result Date: 04/06/2021    ECHOCARDIOGRAM REPORT   Patient Name:   Alejandra Davis Date of Exam: 04/06/2021 Medical Rec #:  [id number]     Height:       63.0 in Accession #:    [id number]    Weight:       118.4 lb Date of Birth:  07/20/1936    BSA:          1.547 m Patient Age:    25 years      BP:           124/72 mmHg Patient Gender: F             HR:           112 bpm. Exam Location:  Inpatient Procedure: 2D Echo, 3D Echo, Cardiac Doppler, Color Doppler and Strain Analysis Indications:    Endocarditis.  History:        Patient has prior history of Echocardiogram examinations, most                 recent 09/30/2018. Abnormal ECG and Tachycardia,                 Signs/Symptoms:Murmur and Alzheimer's; Risk Factors:Diabetes and                 Hypertension.  Sonographer:    Roseanna Rainbow RDCS Referring Phys: [id number] Sherryll Burger [facility]  Sonographer Comments: Global longitudinal strain was attempted. IMPRESSIONS  1. Left ventricular ejection fraction, by estimation, is 60 to 65%. The left ventricle has normal function. The left ventricle has no regional wall motion abnormalities. Left ventricular diastolic parameters are indeterminate. The average left ventricular global longitudinal strain is -15.9 %. The global longitudinal strain is normal.  2. Right ventricular systolic function is normal. The right ventricular size is normal. There is normal pulmonary artery systolic pressure.  3. A small pericardial effusion is present. The pericardial  effusion is posterior to the left ventricle.  4. The mitral valve is degenerative. Trivial mitral valve regurgitation. No evidence of mitral stenosis. Moderate mitral annular calcification.  5. Significant AV sclerosis particularly the non coronary cusp without stenosis . The aortic valve is calcified. There is severe calcifcation of the aortic valve. There is severe thickening of the aortic valve. Aortic valve regurgitation is trivial. Mild to moderate aortic valve sclerosis/calcification is present, without any evidence of aortic stenosis.  6. The inferior vena cava is normal in size with greater than 50% respiratory variability, suggesting right atrial pressure of 3 mmHg. FINDINGS  Left Ventricle: Left ventricular ejection fraction, by estimation, is 60 to 65%. The left ventricle has normal function. The left ventricle has no regional wall motion abnormalities. The average left ventricular global longitudinal  strain is -15.9 %. The global longitudinal strain is normal. 3D left ventricular ejection fraction analysis performed but not reported based on interpreter judgement due to suboptimal quality. The left ventricular internal cavity size was normal in size. There is no left ventricular hypertrophy. Left ventricular diastolic parameters are indeterminate. Right Ventricle: The right ventricular size is normal. No increase in right ventricular wall thickness. Right ventricular systolic function is normal. There is normal pulmonary artery systolic pressure. The tricuspid regurgitant velocity is 2.02 m/s, and  with an assumed right atrial pressure of 3 mmHg, the estimated right ventricular systolic pressure is 26.8 mmHg. Left Atrium: Left atrial size was normal in size. Right Atrium: Right atrial size was normal in size. Pericardium: A small pericardial effusion is present. The pericardial effusion is posterior to the left ventricle. Mitral Valve: The mitral valve is degenerative in appearance. There is moderate  thickening of the mitral valve leaflet(s). There is moderate calcification of the mitral valve leaflet(s). Moderate mitral annular calcification. Trivial mitral valve regurgitation. No evidence of mitral valve stenosis. Tricuspid Valve: The tricuspid valve is normal in structure. Tricuspid valve regurgitation is mild . No evidence of tricuspid stenosis. Aortic Valve: Significant AV sclerosis particularly the non coronary cusp without stenosis. The aortic valve is calcified. There is severe calcifcation of the aortic valve. There is severe thickening of the aortic valve. There is severe aortic valve annular calcification. Aortic valve regurgitation is trivial. Mild to moderate aortic valve sclerosis/calcification is present, without any evidence of aortic stenosis. Aortic valve mean gradient measures 7.0 mmHg. Aortic valve peak gradient measures 12.2 mmHg. Aortic valve area, by VTI measures 2.24 cm. Pulmonic Valve: The pulmonic valve was normal in structure. Pulmonic valve regurgitation is not visualized. No evidence of pulmonic stenosis. Aorta: The aortic root is normal in size and structure. Venous: The inferior vena cava is normal in size with greater than 50% respiratory variability, suggesting right atrial pressure of 3 mmHg. IAS/Shunts: No atrial level shunt detected by color flow Doppler.  LEFT VENTRICLE PLAX 2D LVIDd:         2.40 cm     Diastology LVIDs:         1.40 cm     LV e' medial:   7.83 cm/s LV PW:         1.80 cm     LV E/e' medial: 14.4 LV IVS:        1.80 cm LVOT diam:     2.05 cm     2D Longitudinal Strain LV SV:         72          2D Strain GLS Avg:     -15.9 % LV SV Index:   46 LVOT Area:     3.30 cm  LV Volumes (MOD) LV vol d, MOD A2C: 49.0 ml LV vol d, MOD A4C: 32.0 ml LV vol s, MOD A2C: 17.7 ml LV vol s, MOD A4C: 10.8 ml LV SV MOD A2C:     31.3 ml LV SV MOD A4C:     32.0 ml LV SV MOD BP:      25.3 ml RIGHT VENTRICLE             IVC RV S prime:     11.30 cm/s  IVC diam: 1.00 cm TAPSE  (M-mode): 2.1 cm LEFT ATRIUM             Index       RIGHT ATRIUM  Index LA diam:        2.60 cm 1.68 cm/m  RA Area:     10.80 cm LA Vol (A2C):   20.0 ml 12.93 ml/m RA Volume:   18.80 ml  12.15 ml/m LA Vol (A4C):   21.8 ml 14.09 ml/m LA Biplane Vol: 21.6 ml 13.96 ml/m  AORTIC VALVE AV Area (Vmax):    2.47 cm AV Area (Vmean):   2.13 cm AV Area (VTI):     2.24 cm AV Vmax:           175.00 cm/s AV Vmean:          131.000 cm/s AV VTI:            0.320 m AV Peak Grad:      12.2 mmHg AV Mean Grad:      7.0 mmHg LVOT Vmax:         131.00 cm/s LVOT Vmean:        84.600 cm/s LVOT VTI:          0.217 m LVOT/AV VTI ratio: 0.68  AORTA Ao Root diam: 3.30 cm Ao Asc diam:  3.30 cm MITRAL VALVE                TRICUSPID VALVE MV Area (PHT): 6.32 cm     TR Peak grad:   16.3 mmHg MV Decel Time: 120 msec     TR Vmax:        202.00 cm/s MV E velocity: 113.00 cm/s                             SHUNTS                             Systemic VTI:  0.22 m                             Systemic Diam: 2.05 cm Jenkins Rouge MD Electronically signed by Jenkins Rouge MD Signature Date/Time: 04/06/2021/10:23:50 AM    Final    CT Angio Abd/Pel W and/or Wo Contrast  Result Date: 04/05/2021 CLINICAL DATA:  GI bleed EXAM: CTA ABDOMEN AND PELVIS WITHOUT AND WITH CONTRAST TECHNIQUE: Multidetector CT imaging of the abdomen and pelvis was performed using the standard protocol during bolus administration of intravenous contrast. Multiplanar reconstructed images and MIPs were obtained and reviewed to evaluate the vascular anatomy. CONTRAST:  67mL OMNIPAQUE IOHEXOL 350 MG/ML SOLN COMPARISON:  09/16/2019. FINDINGS: VASCULAR Aortic atherosclerosis. Redemonstrated chronic penetrating ulceration and/or dissection of the distal descending thoracic aorta (series 5, image 20). No aneurysm. Standard branching pattern of the abdominal aorta with solitary bilateral renal arteries. Review of the MIP images confirms the above findings. NON-VASCULAR Lower  chest: No acute abnormality. Hepatobiliary: No focal liver abnormality is seen. No gallstones, gallbladder wall thickening, or biliary dilatation. Pancreas: Unremarkable. No pancreatic ductal dilatation or surrounding inflammatory changes. Spleen: Normal in size without focal abnormality. Adrenals/Urinary Tract: Adrenal glands are unremarkable. There are numerous tiny hypoenhancing lesions of the bilateral renal cortices. Thickening of the bladder. Large diverticulum of the posterior left aspect of the bladder. Stomach/Bowel: Stomach is within normal limits. There is a large volume of hyperdense fluid in the transverse colon and distally to the rectum. Sigmoid diverticulosis. Lymphatic: No enlarged abdominal or pelvic lymph nodes. Reproductive: Status post hysterectomy. Other: No abdominal wall hernia or  abnormality. No abdominopelvic ascites. Musculoskeletal: No acute or significant osseous findings. IMPRESSION: 1. Large volume of hyperdense fluid in the transverse colon distally to the rectum, presumably blood. No evidence of specific nidus of GI bleeding or intraluminal contrast extravasation. 2. Sigmoid diverticulosis without evidence of acute diverticulitis. 3. Numerous tiny hypoenhancing lesions of the bilateral renal cortices, suggesting embolic shower, of uncertain acuity. 4. Redemonstrated chronic penetrating ulceration or dissection of the distal descending thoracic aorta. No aneurysm. 5.  Aortic Atherosclerosis (ICD10-I70.0). 6. Thickening of the urinary bladder, suggestive of nonspecific infectious or inflammatory cystitis. Correlate with urinalysis. Electronically Signed   By: Eddie Candle M.D.   On: 04/05/2021 21:10    Subjective: Feels well, no bleeding, wants to go home. Eating ok, no abd pain.  Discharge Exam: Vitals:   04/10/21 2146 04/11/21 0629  BP: (!) 147/83 131/79  Pulse: 91 81  Resp: 18 16  Temp: 97.9 F (36.6 C) 98.5 F (36.9 C)  SpO2: 99% 100%   General: Pt is alert, awake,  not in acute distress Cardiovascular: RRR, S1/S2 +, no rubs, no gallops Respiratory: CTA bilaterally, no wheezing, no rhonchi Abdominal: Soft, NT, ND, bowel sounds + Extremities: No edema, no cyanosis  Labs: BNP (last 3 results) No results for input(s): BNP in the last 8760 hours. Basic Metabolic Panel: Recent Labs  Lab 04/06/21 0518 04/07/21 0515 04/09/21 0907 04/10/21 0141 04/10/21 0635  NA 142 138 139 140 138  K 3.8 4.1 3.9 3.5 3.3*  CL 111 108 107 108 107  CO2 23 23 25 27 23   GLUCOSE 105* 86 104* 135* 114*  BUN 17 11 12 17 13   CREATININE 0.96 0.90 1.11* 1.25* 1.23*  CALCIUM 8.0* 8.4* 8.9 9.2 8.7*  MG 1.5* 1.8  --   --   --   PHOS  --  2.8  --   --   --    Liver Function Tests: Recent Labs  Lab 04/05/21 1938 04/06/21 0518 04/10/21 0141  AST 20 24 22   ALT 18 17 18   ALKPHOS 54 48 57  BILITOT 0.2* 1.0 0.4  PROT 6.6 5.8* 6.8  ALBUMIN 3.2* 3.0* 3.6   Recent Labs  Lab 04/10/21 0141  LIPASE 33   No results for input(s): AMMONIA in the last 168 hours. CBC: Recent Labs  Lab 04/05/21 1938 04/05/21 2230 04/06/21 0000 04/06/21 1037 04/06/21 1615 04/09/21 0907 04/10/21 0141 04/10/21 0635 04/11/21 0529  WBC 6.0   < > 8.1 8.2 8.1 5.7 5.7  --   --   NEUTROABS 3.4  --   --   --   --   --  3.5  --   --   HGB 11.4*   < > 12.3 12.4 11.6* 11.9* 10.7* 10.6* 10.9*  HCT 35.0*   < > 36.9 37.6 34.6* 37.0 32.4* 32.5* 32.8*  MCV 82.0   < > 84.2 84.3 83.4 86.7 85.5  --   --   PLT 232   < > 128* 169 169 211 219  --   --    < > = values in this interval not displayed.   Cardiac Enzymes: No results for input(s): CKTOTAL, CKMB, CKMBINDEX, TROPONINI in the last 168 hours. BNP: Invalid input(s): POCBNP CBG: Recent Labs  Lab 04/10/21 0733 04/10/21 1210 04/10/21 2005 04/11/21 0003 04/11/21 0732  GLUCAP 105* 157* 132* 112* 116*   D-Dimer No results for input(s): DDIMER in the last 72 hours. Hgb A1c No results for input(s): HGBA1C in the last 72  hours. Lipid  Profile No results for input(s): CHOL, HDL, LDLCALC, TRIG, CHOLHDL, LDLDIRECT in the last 72 hours. Thyroid function studies No results for input(s): TSH, T4TOTAL, T3FREE, THYROIDAB in the last 72 hours.  Invalid input(s): FREET3 Anemia work up No results for input(s): VITAMINB12, FOLATE, FERRITIN, TIBC, IRON, RETICCTPCT in the last 72 hours. Urinalysis    Component Value Date/Time   COLORURINE YELLOW 09/16/2019 1354   APPEARANCEUR HAZY (A) 09/16/2019 1354   LABSPEC 1.021 09/16/2019 1354   PHURINE 6.0 09/16/2019 1354   GLUCOSEU NEGATIVE 09/16/2019 1354   GLUCOSEU NEGATIVE 03/13/2019 1345   HGBUR NEGATIVE 09/16/2019 1354   BILIRUBINUR Negative 01/01/2021 0946   KETONESUR 5 (A) 09/16/2019 1354   PROTEINUR Positive (A) 01/01/2021 0946   PROTEINUR 30 (A) 09/16/2019 1354   UROBILINOGEN negative (A) 01/01/2021 0946   UROBILINOGEN 0.2 03/13/2019 1345   NITRITE Negative 01/01/2021 0946   NITRITE NEGATIVE 09/16/2019 1354   LEUKOCYTESUR Large (3+) (A) 01/01/2021 0946   LEUKOCYTESUR MODERATE (A) 09/16/2019 1354    Microbiology Recent Results (from the past 240 hour(s))  Resp Panel by RT-PCR (Flu A&B, Covid) Nasopharyngeal Swab     Status: None   Collection Time: 04/05/21  7:46 PM   Specimen: Nasopharyngeal Swab; Nasopharyngeal(NP) swabs in vial transport medium  Result Value Ref Range Status   SARS Coronavirus 2 by RT PCR NEGATIVE NEGATIVE Final    Comment: (NOTE) SARS-CoV-2 target nucleic acids are NOT DETECTED.  The SARS-CoV-2 RNA is generally detectable in upper respiratory specimens during the acute phase of infection. The lowest concentration of SARS-CoV-2 viral copies this assay can detect is 138 copies/mL. A negative result does not preclude SARS-Cov-2 infection and should not be used as the sole basis for treatment or other patient management decisions. A negative result may occur with  improper specimen collection/handling, submission of specimen other than nasopharyngeal  swab, presence of viral mutation(s) within the areas targeted by this assay, and inadequate number of viral copies(<138 copies/mL). A negative result must be combined with clinical observations, patient history, and epidemiological information. The expected result is Negative.  Fact Sheet for Patients:  EntrepreneurPulse.com.au  Fact Sheet for Healthcare Providers:  IncredibleEmployment.be  This test is no t yet approved or cleared by the [place] FDA and  has been authorized for detection and/or diagnosis of SARS-CoV-2 by FDA under an Emergency Use Authorization (EUA). This EUA will remain  in effect (meaning this test can be used) for the duration of the COVID-19 declaration under Section 564(b)(1) of the Act, 21 U.S.C.section 360bbb-3(b)(1), unless the authorization is terminated  or revoked sooner.       Influenza A by PCR NEGATIVE NEGATIVE Final   Influenza B by PCR NEGATIVE NEGATIVE Final    Comment: (NOTE) The Xpert Xpress SARS-CoV-2/FLU/RSV plus assay is intended as an aid in the diagnosis of influenza from Nasopharyngeal swab specimens and should not be used as a sole basis for treatment. Nasal washings and aspirates are unacceptable for Xpert Xpress SARS-CoV-2/FLU/RSV testing.  Fact Sheet for Patients: EntrepreneurPulse.com.au  Fact Sheet for Healthcare Providers: IncredibleEmployment.be  This test is not yet approved or cleared by the [place] FDA and has been authorized for detection and/or diagnosis of SARS-CoV-2 by FDA under an Emergency Use Authorization (EUA). This EUA will remain in effect (meaning this test can be used) for the duration of the COVID-19 declaration under Section 564(b)(1) of the Act, 21 U.S.C. section 360bbb-3(b)(1), unless the authorization is terminated or revoked.  Performed at [facility], [address] [place], [place] [address]    Culture, blood (routine x 2)     Status: None   Collection Time: 04/06/21  7:28 AM   Specimen: BLOOD  Result Value Ref Range Status   Specimen Description   Final    BLOOD RIGHT ANTECUBITAL Performed at [facility] [address] [place], [place] [address]    Special Requests   Final    BOTTLES DRAWN AEROBIC AND ANAEROBIC Blood Culture adequate volume Performed at [facility] [address] [place], [place] [address]    Culture   Final    NO GROWTH 5 DAYS Performed at [facility], [place] [address] [place], [place] [address]    Report Status 04/11/2021 FINAL  Final  Culture, blood (routine x 2)     Status: None   Collection Time: 04/06/21  7:28 AM   Specimen: BLOOD  Result Value Ref Range Status   Specimen Description   Final    BLOOD LEFT ANTECUBITAL Performed at [facility] [address], [place], [place] [address]    Special Requests   Final    BOTTLES DRAWN AEROBIC AND ANAEROBIC Blood Culture adequate volume Performed at [facility] [address], [place], [place] [id number]    Culture   Final    NO GROWTH 5 DAYS Performed at [facility], [place] [address] [place], [place] [address]    Report Status 04/11/2021 FINAL  Final  SARS CORONAVIRUS 2 (TAT 6-24 HRS) Nasopharyngeal Nasopharyngeal Swab     Status: None   Collection Time: 04/10/21  1:36 AM   Specimen: Nasopharyngeal Swab  Result Value Ref Range Status   SARS Coronavirus 2 NEGATIVE NEGATIVE Final    Comment: (NOTE) SARS-CoV-2 target nucleic acids are NOT DETECTED.  The SARS-CoV-2 RNA is generally detectable in upper and lower respiratory specimens during the acute phase of infection. Negative results do not preclude SARS-CoV-2 infection, do not rule out co-infections with other pathogens, and should not be used as the sole basis for treatment or other patient management decisions. Negative results must be combined  with clinical observations, patient history, and epidemiological information. The expected result is Negative.  Fact Sheet for Patients: SugarRoll.be  Fact Sheet for Healthcare Providers: https://www.woods-mathews.com/  This test is not yet approved or cleared by the [place] FDA and  has been authorized for detection and/or diagnosis of SARS-CoV-2 by FDA under an Emergency Use Authorization (EUA). This EUA will remain  in effect (meaning this test can be used) for the duration of the COVID-19 declaration under Se ction 564(b)(1) of the Act, 21 U.S.C. section 360bbb-3(b)(1), unless the authorization is terminated or revoked sooner.  Performed at [facility], [place] [address], [place], [place] [address]     Time coordinating discharge: Approximately 40 minutes  Patrecia Pour, MD  [facility] 04/11/2021, 8:33 AM

## 2021-04-15 DIAGNOSIS — H401133 Primary open-angle glaucoma, bilateral, severe stage: Secondary | ICD-10-CM | POA: Diagnosis not present

## 2021-04-18 ENCOUNTER — Ambulatory Visit: Payer: Medicare PPO | Admitting: Family

## 2021-04-22 ENCOUNTER — Encounter: Payer: Self-pay | Admitting: Family

## 2021-04-22 ENCOUNTER — Ambulatory Visit: Payer: Medicare PPO | Admitting: Family

## 2021-04-22 ENCOUNTER — Other Ambulatory Visit: Payer: Self-pay

## 2021-04-22 VITALS — BP 146/88 | HR 76 | Temp 97.3°F | Resp 16 | Ht 63.0 in | Wt 113.8 lb

## 2021-04-22 DIAGNOSIS — I10 Essential (primary) hypertension: Secondary | ICD-10-CM

## 2021-04-22 DIAGNOSIS — E039 Hypothyroidism, unspecified: Secondary | ICD-10-CM

## 2021-04-22 DIAGNOSIS — D649 Anemia, unspecified: Secondary | ICD-10-CM

## 2021-04-22 DIAGNOSIS — F5101 Primary insomnia: Secondary | ICD-10-CM

## 2021-04-22 DIAGNOSIS — J302 Other seasonal allergic rhinitis: Secondary | ICD-10-CM

## 2021-04-22 DIAGNOSIS — F0390 Unspecified dementia without behavioral disturbance: Secondary | ICD-10-CM | POA: Diagnosis not present

## 2021-04-22 DIAGNOSIS — K922 Gastrointestinal hemorrhage, unspecified: Secondary | ICD-10-CM

## 2021-04-22 DIAGNOSIS — E876 Hypokalemia: Secondary | ICD-10-CM | POA: Diagnosis not present

## 2021-04-22 DIAGNOSIS — E119 Type 2 diabetes mellitus without complications: Secondary | ICD-10-CM

## 2021-04-22 MED ORDER — MELATONIN 3 MG PO TABS: 3.0000 mg | ORAL_TABLET | Freq: Every day | ORAL | 0 refills | Status: DC

## 2021-04-22 NOTE — Patient Instructions (Signed)
-   Take Melatonin 3 mg tablet one by mouth daily at bedtime as needed for sleep.May repeat x 1 dose if still unable to sleep.   - Labs done today will call you with results.   - Notify provider or go to ED if rectal bleeding occurs

## 2021-04-22 NOTE — Progress Notes (Signed)
Provider: Marlowe Sax FNP-C  Nariyah Osias, Nelda Bucks, NP  Patient Care Team: Veer Elamin, Nelda Bucks, NP as PCP - General (Family Medicine)  Extended Emergency Contact Information Primary Emergency Contact: Feger,[name] Address: [address]          [place], [place] [address] [place] Phone: [phone] Relation: Daughter Secondary Emergency Contact: Royer,Mitchell Address: [place]          [place] [address] [place] of [place] Mobile Phone: [phone] Relation: Son  Code Status:  Full Code  Goals of care: Advanced Directive information Advanced Directives 04/22/2021  Does Patient Have a Medical Advance Directive? Yes  Type of Paramedic of [place];Living will  Does patient want to make changes to medical advance directive? No - Patient declined  Copy of [place] in Chart? Yes - validated most recent copy scanned in chart (See row information)  Would patient like information on creating a medical advance directive? -     Chief Complaint  Patient presents with  . Transitions Of Care    Hospitalization Follow Up 04/10/2021-04/11/2021    HPI:  Pt is a 85 y.o. female seen today for an acute visit for follow up hospitalization from 04/10/2021 - 04/11/2021 after she returned to the hospital reported wiping red blood after urinating.she was admitted for observation during which had no bleeding and her hemoglobin was stable.Her admission Hgb was 10.6 previous was 11.9 ( 04/09/2021).ASA was held. Of note she was status post hospital admission from 04/05/2021 - 04/09/2021 for GI bleed.She had a Abdominal CT scan at [facility] which showed hyperdense fluid in colon concerning for blood.she was found to have sigmoid diverticulosis without diverticulitis.RBC scan did not show any active bleed.GI was consulted and was admitted.had colonoscopy findings of diverticulosis.Had 2 units PRBC transfusion. Incidental  finding of numerous hypoenhancing lesions of the bilateral renal cortices concerning for embolic phenomenon.No signs of ineffective endocarditis noted.EKG was normal.ECHO showed preserved LV function EF 60 - 65% with right ventricular systolic function,chest X-ray showed no evidence of PE. Penetrating focal ulceration over the left side of the descending thoracic aorta was reported.Vascular surgery Dr.Dickson was consulted no indication for anticoagulation plans to follow up outpatient in one month   She is here alone today states daughter is busy at work in [place]. She denies any more rectal bleeding.  She brought in all her medications bottles for visit.Medication reviewed and reconciled.  Hospital lab work reviewed CR 1.23,K+ 3.3 and Ca 8.7 will recheck labs today.   Of note,patient's daughter had send Mychart message to provider stated patient needed something to help her calm at night states had a horrible experience with Ambien in the past she could sleep quickly but was up after short while wide awake really confused and belligerent.wonders whether she can take melatonin gummies.she was advised to give 3-6 mg at bedtime.Patient denies any issues with insomnia though HPI limited due to patient's hx of dementia.   Past Medical History:  Diagnosis Date  . Chicken pox   . Cystocele   . Diabetes mellitus without complication ([place])   . Glaucoma   . Hypertension   . Hypertensive retinopathy    OU  . Neuromuscular disorder ([place])    Dementia   . Thyroid disease    hypothyroidism   Past Surgical History:  Procedure Laterality Date  . ABDOMINAL HYSTERECTOMY  1990   TAH BSO  . CATARACT EXTRACTION Bilateral    Dr. [name]  .  COLONOSCOPY WITH PROPOFOL N/A 04/08/2021   Procedure: COLONOSCOPY WITH PROPOFOL;  Surgeon: [name], MD;  Location: [facility] ENDOSCOPY;  Service: Endoscopy;  Laterality: N/A;  . EYE SURGERY Bilateral    Cat Sx OU  . OOPHORECTOMY     BSO  . Vaginal Bx     Papilloma     Allergies  Allergen Reactions  . Donepezil Hcl     Stomach cramps     Outpatient Encounter Medications as of 04/22/2021  Medication Sig  . amLODipine (NORVASC) 10 MG tablet TAKE 1 TABLET(10 MG) BY MOUTH DAILY  . dorzolamide-timolol (COSOPT) 22.3-6.8 MG/ML ophthalmic solution Place 1 drop into both eyes 2 (two) times daily.  . Lancets Ultra Thin 30G MISC 100 each by Does not apply route 3 (three) times a week.  . levothyroxine (SYNTHROID) 75 MCG tablet Take 1 tablet (75 mcg total) by mouth daily.  Marland Kitchen lisinopril (ZESTRIL) 2.5 MG tablet Take 1 tablet (2.5 mg total) by mouth daily.  Marland Kitchen loratadine (CLARITIN) 10 MG tablet Take 1 tablet (10 mg total) by mouth daily.  . metFORMIN (GLUCOPHAGE) 500 MG tablet Take 1 tablet (500 mg total) by mouth 2 (two) times daily with a meal.  . metoprolol tartrate (LOPRESSOR) 50 MG tablet Take 1 tablet (50 mg total) by mouth daily.   No facility-administered encounter medications on file as of 04/22/2021.    Review of Systems  Unable to perform ROS: Dementia  Constitutional: Negative for appetite change, chills, fatigue, fever and unexpected weight change.  HENT: Negative for congestion, dental problem, ear discharge, ear pain, facial swelling, hearing loss, nosebleeds, postnasal drip, sinus pressure, sinus pain, sneezing, sore throat, tinnitus and trouble swallowing.        Seasonal allergies claritin has been effective   Eyes: Negative for pain, discharge, redness, itching and visual disturbance.  Respiratory: Negative for cough, chest tightness, shortness of breath and wheezing.   Cardiovascular: Negative for chest pain, palpitations and leg swelling.  Gastrointestinal: Negative for abdominal distention, abdominal pain, blood in stool, constipation, diarrhea, nausea and vomiting.  Endocrine: Negative for cold intolerance, heat intolerance, polydipsia, polyphagia and polyuria.  Genitourinary: Negative for difficulty urinating, dysuria, flank pain,  frequency and urgency.  Musculoskeletal: Negative for arthralgias, back pain, gait problem, joint swelling, myalgias, neck pain and neck stiffness.  Skin: Negative for color change, pallor, rash and wound.  Neurological: Negative for dizziness, syncope, speech difficulty, weakness, light-headedness, numbness and headaches.  Hematological: Does not bruise/bleed easily.  Psychiatric/Behavioral: Negative for agitation, behavioral problems, confusion, hallucinations, self-injury and suicidal ideas. The patient is not nervous/anxious.        Daughter reports not sleeping well request melatonin     Immunization History  Administered Date(s) Administered  . Fluad Quad(high Dose 65+) 09/17/2020  . Influenza, High Dose Seasonal PF 12/20/2017, 09/25/2019  . Influenza-Unspecified 12/20/2017  . PFIZER(Purple Top)SARS-COV-2 Vaccination 01/05/2020, 02/05/2020  . Pneumococcal Polysaccharide-23 03/13/2020  . Zoster Recombinat (Shingrix) 02/07/2018, 05/18/2018   Pertinent  Health Maintenance Due  Topic Date Due  . OPHTHALMOLOGY EXAM  12/18/2020  . FOOT EXAM  02/22/2021  . PNA vac Low Risk Adult (2 of 2 - PCV13) 03/13/2021  . INFLUENZA VACCINE  07/28/2021  . HEMOGLOBIN A1C  10/06/2021  . DEXA SCAN  Completed   Fall Risk  04/22/2021 03/25/2021 03/04/2021 02/07/2021 01/01/2021  Falls in the past year? 0 0 0 0 0  Number falls in past yr: 0 0 0 0 0  Injury with Fall? 0 0 0 0 0  Functional Status Survey:    Vitals:   04/22/21 1016  BP: (!) 146/88  Pulse: 76  Resp: 16  Temp: (!) 97.3 F (36.3 C)  SpO2: 97%  Weight: 113 lb 12.8 oz (51.6 kg)  Height: 5' 3"  (1.6 m)   Body mass index is 20.16 kg/m. Physical Exam Vitals reviewed.  Constitutional:      General: She is not in acute distress.    Appearance: Normal appearance. She is normal weight. She is not ill-appearing or diaphoretic.  HENT:     Head: Normocephalic.     Right Ear: Tympanic membrane, ear canal and external ear normal. There is no  impacted cerumen.     Left Ear: Tympanic membrane, ear canal and external ear normal. There is no impacted cerumen.     Nose: Nose normal. No congestion or rhinorrhea.     Mouth/Throat:     Mouth: Mucous membranes are moist.     Pharynx: Oropharynx is clear. No oropharyngeal exudate or posterior oropharyngeal erythema.  Eyes:     General: No scleral icterus.       Right eye: No discharge.        Left eye: No discharge.     Extraocular Movements: Extraocular movements intact.     Conjunctiva/sclera: Conjunctivae normal.     Pupils: Pupils are equal, round, and reactive to light.  Neck:     Vascular: No carotid bruit.  Cardiovascular:     Rate and Rhythm: Normal rate and regular rhythm.     Pulses: Normal pulses.     Heart sounds: Normal heart sounds. No murmur heard. No friction rub. No gallop.   Pulmonary:     Effort: Pulmonary effort is normal. No respiratory distress.     Breath sounds: Normal breath sounds. No wheezing, rhonchi or rales.  Chest:     Chest wall: No tenderness.  Abdominal:     General: Bowel sounds are normal. There is no distension.     Palpations: Abdomen is soft. There is no mass.     Tenderness: There is no abdominal tenderness. There is no right CVA tenderness, left CVA tenderness, guarding or rebound.  Musculoskeletal:        General: No swelling or tenderness. Normal range of motion.     Cervical back: Normal range of motion. No rigidity or tenderness.     Right lower leg: No edema.     Left lower leg: No edema.  Lymphadenopathy:     Cervical: No cervical adenopathy.  Skin:    General: Skin is warm and dry.     Coloration: Skin is not pale.     Findings: No bruising, erythema, lesion or rash.  Neurological:     Mental Status: She is alert. Mental status is at baseline.     Cranial Nerves: No cranial nerve deficit.     Sensory: No sensory deficit.     Motor: No weakness.     Coordination: Coordination normal.     Gait: Gait normal.  Psychiatric:         Mood and Affect: Mood normal.        Speech: Speech normal.        Behavior: Behavior normal.        Thought Content: Thought content normal.        Cognition and Memory: Memory is impaired.    Labs reviewed: Recent Labs    04/06/21 0518 04/07/21 0515 04/09/21 0907 04/10/21 0141 04/10/21 0635  NA 142 138  139 140 138  K 3.8 4.1 3.9 3.5 3.3*  CL 111 108 107 108 107  CO2 23 23 25 27 23   GLUCOSE 105* 86 104* 135* 114*  BUN 17 11 12 17 13   CREATININE 0.96 0.90 1.11* 1.25* 1.23*  CALCIUM 8.0* 8.4* 8.9 9.2 8.7*  MG 1.5* 1.8  --   --   --   PHOS  --  2.8  --   --   --    Recent Labs    04/05/21 1938 04/06/21 0518 04/10/21 0141  AST 20 24 22   ALT 18 17 18   ALKPHOS 54 48 57  BILITOT 0.2* 1.0 0.4  PROT 6.6 5.8* 6.8  ALBUMIN 3.2* 3.0* 3.6   Recent Labs    03/24/21 0816 04/05/21 1938 04/05/21 2230 04/06/21 1615 04/09/21 0907 04/10/21 0141 04/10/21 0635 04/11/21 0529  WBC 4.7 6.0   < > 8.1 5.7 5.7  --   --   NEUTROABS 2,909 3.4  --   --   --  3.5  --   --   HGB 13.2 11.4*   < > 11.6* 11.9* 10.7* 10.6* 10.9*  HCT 42.0 35.0*   < > 34.6* 37.0 32.4* 32.5* 32.8*  MCV 83.0 82.0   < > 83.4 86.7 85.5  --   --   PLT 236 232   < > 169 211 219  --   --    < > = values in this interval not displayed.   Lab Results  Component Value Date   TSH 1.49 01/15/2021   Lab Results  Component Value Date   HGBA1C 6.4 (H) 04/06/2021   Lab Results  Component Value Date   CHOL 204 (H) 03/24/2021   HDL 68 03/24/2021   LDLCALC 119 (H) 03/24/2021   TRIG 71 03/24/2021   CHOLHDL 3.0 03/24/2021    Significant Diagnostic Results in last 30 days:  CT HEAD WO CONTRAST  Result Date: 04/06/2021 CLINICAL DATA:  Change in mental status.  Hematochezia EXAM: CT HEAD WITHOUT CONTRAST TECHNIQUE: Contiguous axial images were obtained from the base of the skull through the vertex without intravenous contrast. COMPARISON:  Brain MRI 05/02/2019 FINDINGS: Brain: No evidence of acute infarction,  hemorrhage, hydrocephalus, extra-axial collection or mass lesion/mass effect. Cerebral and especially cerebellar atrophy. Chronic small vessel ischemia throughout the cerebral white matter. Chronic lacunar infarct at the right pons. Vascular: No hyperdense vessel or unexpected calcification. Skull: Normal. Negative for fracture or focal lesion. Sinuses/Orbits: No acute finding. IMPRESSION: No acute finding. Atrophy and extensive chronic small vessel ischemia. Electronically Signed   By: Monte Fantasia M.D.   On: 04/06/2021 08:58   CT ANGIO CHEST PE W OR WO CONTRAST  Result Date: 04/06/2021 CLINICAL DATA:  Chest pain and altered mental status. EXAM: CT ANGIOGRAPHY CHEST WITH CONTRAST TECHNIQUE: Multidetector CT imaging of the chest was performed using the standard protocol during bolus administration of intravenous contrast. Multiplanar CT image reconstructions and MIPs were obtained to evaluate the vascular anatomy. CONTRAST:  69m OMNIPAQUE IOHEXOL 350 MG/ML SOLN COMPARISON:  CT angiogram abdomen and pelvis including lower lung regions April 05, 2021 FINDINGS: There are scattered foci calcification in visualized great vessels. There are multiple foci of aortic atherosclerosis. There is an area of apparent plaque ulceration along the leftward aspect of the distal aorta near the gastroesophageal junction. The focus of plaque ulceration measures 1.8 x 0.7 cm. There is less than 50% diameter narrowing of the distal descending thoracic aorta in this area. No  other plaque ulceration evident on this study. No pericardial effusion or pericardial thickening. There are foci of aortic atherosclerosis. There are foci of coronary artery calcification. Mediastinum/Nodes: Thyroid appears unremarkable. No evident thoracic adenopathy. No appreciable esophageal lesions. Lungs/Pleura: There is scarring in the extreme lung apices. There is mild bibasilar atelectasis. There is no edema or airspace opacity. No pleural effusions. No  pneumothorax. Trachea and major bronchial structures appear patent. Upper Abdomen: There are gallstones within the gallbladder. Contrast is seen within the gallbladder from recent CT angiogram abdomen and pelvis. Cyst arising from left kidney laterally measuring 1.8 x 1.7 cm noted. Musculoskeletal: No blastic or lytic bone lesions. Degenerative change noted in thoracic spine. There are no chest wall lesions. Review of the MIP images confirms the above findings. IMPRESSION: 1. Penetrating focal ulceration along the leftward aspect of the descending thoracic aorta near the gastroesophageal junction level, stable. No similar changes elsewhere. No frank dissection. Moderate atherosclerotic plaque in this region of ulceration noted. No thoracic aortic aneurysm. 2.  No evident pulmonary embolus. 3. Bibasilar atelectasis. No edema or airspace opacity. No pleural effusions. 4.  Cholelithiasis. 5. There are foci of great vessel and coronary artery calcification. Aortic Atherosclerosis (ICD10-I70.0). Electronically Signed   By: Lowella Grip III M.D.   On: 04/06/2021 09:21   NM GI Blood Loss  Result Date: 04/06/2021 CLINICAL DATA:  GI bleed EXAM: NUCLEAR MEDICINE GASTROINTESTINAL BLEEDING SCAN TECHNIQUE: Sequential abdominal images were obtained following intravenous administration of Tc-63mlabeled red blood cells. RADIOPHARMACEUTICALS:  22 mCi Tc-979mertechnetate in-vitro labeled red cells. COMPARISON:  CT 04/05/2021 FINDINGS: There is excessive motion on multiple images. Imaging terminated slightly prematurely secondary to concerns over patient safety and motion. No definitive foci of tracer extravasation or static or dynamic foci of activity. IMPRESSION: Limited study secondary to excessive patient motion. No definite acute GI bleeding on current exam. Electronically Signed   By: KiDonavan Foil.D.   On: 04/06/2021 03:55   ECHOCARDIOGRAM COMPLETE  Result Date: 04/06/2021    ECHOCARDIOGRAM REPORT   Patient  Name:   EDOAKLIE DURRETTate of Exam: 04/06/2021 Medical Rec #:  [id number]   Height:       63.0 in Accession #:    [id number]  Weight:       118.4 lb Date of Birth:  10Dec 23, 1937  BSA:          1.547 m Patient Age:    [age]      BP:           124/72 mmHg Patient Gender: F             HR:           112 bpm. Exam Location:  Inpatient Procedure: 2D Echo, 3D Echo, Cardiac Doppler, Color Doppler and Strain Analysis Indications:    Endocarditis.  History:        Patient has prior history of Echocardiogram examinations, most                 recent 09/30/2018. Abnormal ECG and Tachycardia,                 Signs/Symptoms:Murmur and Alzheimer's; Risk Factors:Diabetes and                 Hypertension.  Sonographer:    TiRoseanna RainbowDCS Referring Phys: [id number] BurgerHSt. Joseph'S Medical Center Of StocktonSonographer Comments: Global longitudinal strain was attempted. IMPRESSIONS  1. Left ventricular ejection fraction, by  estimation, is 60 to 65%. The left ventricle has normal function. The left ventricle has no regional wall motion abnormalities. Left ventricular diastolic parameters are indeterminate. The average left ventricular global longitudinal strain is -15.9 %. The global longitudinal strain is normal.  2. Right ventricular systolic function is normal. The right ventricular size is normal. There is normal pulmonary artery systolic pressure.  3. A small pericardial effusion is present. The pericardial effusion is posterior to the left ventricle.  4. The mitral valve is degenerative. Trivial mitral valve regurgitation. No evidence of mitral stenosis. Moderate mitral annular calcification.  5. Significant AV sclerosis particularly the non coronary cusp without stenosis . The aortic valve is calcified. There is severe calcifcation of the aortic valve. There is severe thickening of the aortic valve. Aortic valve regurgitation is trivial. Mild to moderate aortic valve sclerosis/calcification is present, without any evidence of aortic stenosis.  6.  The inferior vena cava is normal in size with greater than 50% respiratory variability, suggesting right atrial pressure of 3 mmHg. FINDINGS  Left Ventricle: Left ventricular ejection fraction, by estimation, is 60 to 65%. The left ventricle has normal function. The left ventricle has no regional wall motion abnormalities. The average left ventricular global longitudinal strain is -15.9 %. The global longitudinal strain is normal. 3D left ventricular ejection fraction analysis performed but not reported based on interpreter judgement due to suboptimal quality. The left ventricular internal cavity size was normal in size. There is no left ventricular hypertrophy. Left ventricular diastolic parameters are indeterminate. Right Ventricle: The right ventricular size is normal. No increase in right ventricular wall thickness. Right ventricular systolic function is normal. There is normal pulmonary artery systolic pressure. The tricuspid regurgitant velocity is 2.02 m/s, and  with an assumed right atrial pressure of 3 mmHg, the estimated right ventricular systolic pressure is 23.7 mmHg. Left Atrium: Left atrial size was normal in size. Right Atrium: Right atrial size was normal in size. Pericardium: A small pericardial effusion is present. The pericardial effusion is posterior to the left ventricle. Mitral Valve: The mitral valve is degenerative in appearance. There is moderate thickening of the mitral valve leaflet(s). There is moderate calcification of the mitral valve leaflet(s). Moderate mitral annular calcification. Trivial mitral valve regurgitation. No evidence of mitral valve stenosis. Tricuspid Valve: The tricuspid valve is normal in structure. Tricuspid valve regurgitation is mild . No evidence of tricuspid stenosis. Aortic Valve: Significant AV sclerosis particularly the non coronary cusp without stenosis. The aortic valve is calcified. There is severe calcifcation of the aortic valve. There is severe thickening  of the aortic valve. There is severe aortic valve annular calcification. Aortic valve regurgitation is trivial. Mild to moderate aortic valve sclerosis/calcification is present, without any evidence of aortic stenosis. Aortic valve mean gradient measures 7.0 mmHg. Aortic valve peak gradient measures 12.2 mmHg. Aortic valve area, by VTI measures 2.24 cm. Pulmonic Valve: The pulmonic valve was normal in structure. Pulmonic valve regurgitation is not visualized. No evidence of pulmonic stenosis. Aorta: The aortic root is normal in size and structure. Venous: The inferior vena cava is normal in size with greater than 50% respiratory variability, suggesting right atrial pressure of 3 mmHg. IAS/Shunts: No atrial level shunt detected by color flow Doppler.  LEFT VENTRICLE PLAX 2D LVIDd:         2.40 cm     Diastology LVIDs:         1.40 cm     LV e' medial:   7.83 cm/s LV  PW:         1.80 cm     LV E/e' medial: 14.4 LV IVS:        1.80 cm LVOT diam:     2.05 cm     2D Longitudinal Strain LV SV:         72          2D Strain GLS Avg:     -15.9 % LV SV Index:   46 LVOT Area:     3.30 cm  LV Volumes (MOD) LV vol d, MOD A2C: 49.0 ml LV vol d, MOD A4C: 32.0 ml LV vol s, MOD A2C: 17.7 ml LV vol s, MOD A4C: 10.8 ml LV SV MOD A2C:     31.3 ml LV SV MOD A4C:     32.0 ml LV SV MOD BP:      25.3 ml RIGHT VENTRICLE             IVC RV S prime:     11.30 cm/s  IVC diam: 1.00 cm TAPSE (M-mode): 2.1 cm LEFT ATRIUM             Index       RIGHT ATRIUM           Index LA diam:        2.60 cm 1.68 cm/m  RA Area:     10.80 cm LA Vol (A2C):   20.0 ml 12.93 ml/m RA Volume:   18.80 ml  12.15 ml/m LA Vol (A4C):   21.8 ml 14.09 ml/m LA Biplane Vol: 21.6 ml 13.96 ml/m  AORTIC VALVE AV Area (Vmax):    2.47 cm AV Area (Vmean):   2.13 cm AV Area (VTI):     2.24 cm AV Vmax:           175.00 cm/s AV Vmean:          131.000 cm/s AV VTI:            0.320 m AV Peak Grad:      12.2 mmHg AV Mean Grad:      7.0 mmHg LVOT Vmax:         131.00 cm/s  LVOT Vmean:        84.600 cm/s LVOT VTI:          0.217 m LVOT/AV VTI ratio: 0.68  AORTA Ao Root diam: 3.30 cm Ao Asc diam:  3.30 cm MITRAL VALVE                TRICUSPID VALVE MV Area (PHT): 6.32 cm     TR Peak grad:   16.3 mmHg MV Decel Time: 120 msec     TR Vmax:        202.00 cm/s MV E velocity: 113.00 cm/s                             SHUNTS                             Systemic VTI:  0.22 m                             Systemic Diam: 2.05 cm Jenkins Rouge MD Electronically signed by Jenkins Rouge MD Signature Date/Time: 04/06/2021/10:23:50 AM    Final    CT Angio Abd/Pel W and/or Wo Contrast  Result Date: 04/05/2021 CLINICAL DATA:  GI bleed EXAM: CTA ABDOMEN AND PELVIS WITHOUT AND WITH CONTRAST TECHNIQUE: Multidetector CT imaging of the abdomen and pelvis was performed using the standard protocol during bolus administration of intravenous contrast. Multiplanar reconstructed images and MIPs were obtained and reviewed to evaluate the vascular anatomy. CONTRAST:  28m OMNIPAQUE IOHEXOL 350 MG/ML SOLN COMPARISON:  09/16/2019. FINDINGS: VASCULAR Aortic atherosclerosis. Redemonstrated chronic penetrating ulceration and/or dissection of the distal descending thoracic aorta (series 5, image 20). No aneurysm. Standard branching pattern of the abdominal aorta with solitary bilateral renal arteries. Review of the MIP images confirms the above findings. NON-VASCULAR Lower chest: No acute abnormality. Hepatobiliary: No focal liver abnormality is seen. No gallstones, gallbladder wall thickening, or biliary dilatation. Pancreas: Unremarkable. No pancreatic ductal dilatation or surrounding inflammatory changes. Spleen: Normal in size without focal abnormality. Adrenals/Urinary Tract: Adrenal glands are unremarkable. There are numerous tiny hypoenhancing lesions of the bilateral renal cortices. Thickening of the bladder. Large diverticulum of the posterior left aspect of the bladder. Stomach/Bowel: Stomach is within normal  limits. There is a large volume of hyperdense fluid in the transverse colon and distally to the rectum. Sigmoid diverticulosis. Lymphatic: No enlarged abdominal or pelvic lymph nodes. Reproductive: Status post hysterectomy. Other: No abdominal wall hernia or abnormality. No abdominopelvic ascites. Musculoskeletal: No acute or significant osseous findings. IMPRESSION: 1. Large volume of hyperdense fluid in the transverse colon distally to the rectum, presumably blood. No evidence of specific nidus of GI bleeding or intraluminal contrast extravasation. 2. Sigmoid diverticulosis without evidence of acute diverticulitis. 3. Numerous tiny hypoenhancing lesions of the bilateral renal cortices, suggesting embolic shower, of uncertain acuity. 4. Redemonstrated chronic penetrating ulceration or dissection of the distal descending thoracic aorta. No aneurysm. 5.  Aortic Atherosclerosis (ICD10-I70.0). 6. Thickening of the urinary bladder, suggestive of nonspecific infectious or inflammatory cystitis. Correlate with urinalysis. Electronically Signed   By: AEddie CandleM.D.   On: 04/05/2021 21:10    Assessment/Plan  1. Anemia, unspecified type Discharge Hgb 10.7 ( 04/10/2021) status post hospital admission for GI bleed suspected source diverticulosis.No active bleeding noted during EGD/colonoscopy.reports no further bleeding since hospital discharge.ASA held risk > potential benefit at this time.  Will recheck CBC  - CBC with Differential/Platelet  2. Lower GI bleed Reports no more bleeding post hospital discharge.  - CBC with Differential/Platelet - Notify provider or go to ED if rectal bleeding occurs   3. Essential hypertension B/p stable  - continue on Amlodipine,lisinopril and metoprolol  - BMP with eGFR([facility])  4. Acquired hypothyroidism Lab Results  Component Value Date   TSH 1.49 01/15/2021  - continue on levothyroxine   5. Controlled type 2 diabetes mellitus without complication, without  long-term current use of insulin (HCC) Lab Results  Component Value Date   HGBA1C 6.4 (H) 04/06/2021  - Continue on metformin - on ACE inhibitor for renal protection   6. Dementia without behavioral disturbance, unspecified dementia type [place] No behavioral issues reported.continue with supportive care.   7. Seasonal allergies Symptoms well controlled on loratadine   8. Hypokalemia K+ 3.3 asymptomatic.will recheck K+ today.  - BMP with eGFR([facility])  9. Primary insomnia Daughter reported insomnia though patient declines. Advised to take melatonin 3 mg tablet as below for sleep may repeat x 1 dose if still unable to sleep.  - melatonin 3 MG TABS tablet; Take 1 tablet (3 mg total) by mouth at bedtime. May repeat x 1 dose if still unable to sleep; Refill: 0   Family/  staff Communication: Reviewed plan of care with patient verbalized understanding.Instruction highlighted with yellow high lighter on patient's instruction on AVS.   Labs/tests ordered:   - CBC with Differential/Platelet - BMP with eGFR([facility])  Next Appointment: Has appointment in place for routine visit.   [name], NP

## 2021-04-23 ENCOUNTER — Other Ambulatory Visit: Payer: Self-pay

## 2021-04-23 LAB — CBC WITH DIFFERENTIAL/PLATELET
Absolute Monocytes: 486 cells/uL (ref 200–950)
Basophils Absolute: 58 cells/uL (ref 0–200)
Basophils Relative: 0.9 %
Eosinophils Absolute: 1158 cells/uL — ABNORMAL HIGH (ref 15–500)
Eosinophils Relative: 18.1 %
HCT: 34.9 % — ABNORMAL LOW (ref 35.0–45.0)
Hemoglobin: 11.1 g/dL — ABNORMAL LOW (ref 11.7–15.5)
Lymphs Abs: 1069 cells/uL (ref 850–3900)
MCH: 27.4 pg (ref 27.0–33.0)
MCHC: 31.8 g/dL — ABNORMAL LOW (ref 32.0–36.0)
MCV: 86.2 fL (ref 80.0–100.0)
MPV: 10.5 fL (ref 7.5–12.5)
Monocytes Relative: 7.6 %
Neutro Abs: 3629 cells/uL (ref 1500–7800)
Neutrophils Relative %: 56.7 %
Platelets: 306 10*3/uL (ref 140–400)
RBC: 4.05 10*6/uL (ref 3.80–5.10)
RDW: 14.4 % (ref 11.0–15.0)
Total Lymphocyte: 16.7 %
WBC: 6.4 10*3/uL (ref 3.8–10.8)

## 2021-04-23 LAB — BASIC METABOLIC PANEL WITHOUT GFR
BUN/Creatinine Ratio: 16 (calc) (ref 6–22)
BUN: 24 mg/dL (ref 7–25)
CO2: 26 mmol/L (ref 20–32)
Calcium: 9.6 mg/dL (ref 8.6–10.4)
Chloride: 106 mmol/L (ref 98–110)
Creat: 1.48 mg/dL — ABNORMAL HIGH (ref 0.60–0.88)
GFR, Est African American: 38 mL/min/1.73m2 — ABNORMAL LOW
GFR, Est Non African American: 33 mL/min/1.73m2 — ABNORMAL LOW
Glucose, Bld: 81 mg/dL (ref 65–99)
Potassium: 4.5 mmol/L (ref 3.5–5.3)
Sodium: 141 mmol/L (ref 135–146)

## 2021-04-23 MED ORDER — LISINOPRIL 2.5 MG PO TABS
2.5000 mg | ORAL_TABLET | Freq: Every day | ORAL | 1 refills | Status: DC
Start: 2021-04-23 — End: 2021-10-06

## 2021-05-07 ENCOUNTER — Encounter: Payer: Medicare PPO | Admitting: Vascular Surgery

## 2021-05-09 ENCOUNTER — Encounter: Payer: Self-pay | Admitting: Podiatry

## 2021-05-09 ENCOUNTER — Other Ambulatory Visit: Payer: Self-pay

## 2021-05-09 ENCOUNTER — Ambulatory Visit: Payer: Medicare PPO | Admitting: Podiatry

## 2021-05-09 DIAGNOSIS — B351 Tinea unguium: Secondary | ICD-10-CM

## 2021-05-09 DIAGNOSIS — M79674 Pain in right toe(s): Secondary | ICD-10-CM | POA: Diagnosis not present

## 2021-05-09 DIAGNOSIS — E119 Type 2 diabetes mellitus without complications: Secondary | ICD-10-CM | POA: Diagnosis not present

## 2021-05-09 DIAGNOSIS — M79675 Pain in left toe(s): Secondary | ICD-10-CM | POA: Diagnosis not present

## 2021-05-09 MED ORDER — LEVOTHYROXINE SODIUM 75 MCG PO TABS: 75.0000 ug | ORAL_TABLET | Freq: Every day | ORAL | 1 refills | Status: DC

## 2021-05-09 NOTE — Progress Notes (Signed)
  Subjective:  Patient ID: Alejandra Davis, female    DOB: 07/04/1936,  MRN: [id number]  No chief complaint on file.  85 y.o. female returns for the above complaint.  Patient presents with thickened elongated dystrophic toenails x10.  Patient would like to have them debrided down.  She denies any other acute complaints she is a type II diabetic with last A1c of 6.6.  She has not seen anyone else prior to seeing me.  Objective:  There were no vitals filed for this visit. Podiatric Exam: Vascular: dorsalis pedis and posterior tibial pulses are palpable bilateral. Capillary return is immediate. Temperature gradient is WNL. Skin turgor WNL  Sensorium: Normal Semmes Weinstein monofilament test. Normal tactile sensation bilaterally. Nail Exam: Pt has thick disfigured discolored nails with subungual debris noted bilateral entire nail hallux through fifth toenails.  Pain on palpation to the nails. Ulcer Exam: There is no evidence of ulcer or pre-ulcerative changes or infection. Orthopedic Exam: Muscle tone and strength are WNL. No limitations in general ROM. No crepitus or effusions noted. HAV  B/L.  Hammer toes 2-5  B/L. Skin: No Porokeratosis. No infection or ulcers    Assessment & Plan:   1. Type 2 diabetes mellitus without complication, without long-term current use of insulin (HCC)   2. Pain due to onychomycosis of toenails of both feet     Patient was evaluated and treated and all questions answered.  Bilateral bunions -I explained to the patient the etiology of bunions and various treatment options were extensively discussed.  I believe patient will benefit from conservative care with offloading shoe gear modification.  Patient states understanding and states she would like to just focus on conservative treatment options for now.  Onychomycosis with pain  -Nails palliatively debrided as below. -Educated on self-care  Procedure: Nail Debridement Rationale: pain  Type of Debridement:  manual, sharp debridement. Instrumentation: Nail nipper, rotary burr. Number of Nails: 10  Procedures and Treatment: Consent by patient was obtained for treatment procedures. The patient understood the discussion of treatment and procedures well. All questions were answered thoroughly reviewed. Debridement of mycotic and hypertrophic toenails, 1 through 5 bilateral and clearing of subungual debris. No ulceration, no infection noted.  Return Visit-Office Procedure: Patient instructed to return to the office for a follow up visit 3 months for continued evaluation and treatment.  Boneta Lucks, DPM    Return in about 3 months (around 08/09/2021) for Routine Foot Care.

## 2021-05-21 ENCOUNTER — Encounter: Payer: Self-pay | Admitting: Vascular Surgery

## 2021-05-21 ENCOUNTER — Ambulatory Visit: Payer: Medicare PPO | Admitting: Vascular Surgery

## 2021-05-21 ENCOUNTER — Other Ambulatory Visit: Payer: Self-pay

## 2021-05-21 VITALS — BP 148/64 | HR 85 | Temp 98.0°F | Resp 16 | Ht 63.0 in | Wt 113.0 lb

## 2021-05-21 DIAGNOSIS — I719 Aortic aneurysm of unspecified site, without rupture: Secondary | ICD-10-CM

## 2021-05-21 NOTE — Progress Notes (Signed)
REASON FOR CONSULT:    Penetrating ulcer of the descending thoracic aorta  ASSESSMENT & PLAN:   PENETRATING AORTIC ULCER: This patient has a penetrating ulcer in the descending thoracic aorta near the GE junction.  This was an incidental finding on a CT of the abdomen and pelvis.  This ulcer is asymptomatic.  She is 85 years old.  I did my best to explain the pathology to the patient but given her history of Alzheimer's dementia I think she has a poor understanding of this and there was no family present here.  Regardless given that she is asymptomatic I think I would only consider thoracic stent graft repair of this if this change significantly or became symptomatic.  I have ordered a follow-up CT of the chest abdomen and pelvis in 6 months and I will see her back at that time.  Hopefully her kidney function will remain stable so that this can be done with contrast.  She will call sooner if she has any abdominal, chest, or back pain.  Deitra Mayo, MD Office: [phone]   HPI:   Alejandra Davis is a pleasant 85 y.o. female, who presented to the emergency department with a GI bleed.  This prompted a CT of the abdomen and pelvis which showed a penetrating ulcer of the descending thoracic aorta near the GE junction.  She subsequently had a CT of the chest to complete her work-up.  She is now 6 weeks out was set up for vascular consultation.  On my history, the patient denies any abdominal pain, chest pain, or back pain.  She was unaware that she had any problems with her aorta.  Of note she has late onset Alzheimer's dementia and is a poor historian.  Risk factors for peripheral vascular disease include type 2 diabetes, hypertension, and hypercholesterolemia.  She denies any family history of premature cardiovascular disease.  She is not a smoker.  She denies any history of myocardial infarction or history of congestive heart failure.  She does have stage III chronic kidney disease.  Her  blood pressure is followed closely by her primary care physician.   Past Medical History:  Diagnosis Date  . Chicken pox   . Cystocele   . Diabetes mellitus without complication ([place])   . Glaucoma   . Hypertension   . Hypertensive retinopathy    OU  . Neuromuscular disorder ([place])    Dementia   . Thyroid disease    hypothyroidism    Family History  Problem Relation Age of Onset  . Sickle cell anemia Other   . Hypertension Mother   . Cancer Father        LIVER  . Heart disease Brother   . Cancer Brother        STOMACH    SOCIAL HISTORY: Social History   Socioeconomic History  . Marital status: Divorced    Spouse name: Not on file  . Number of children: Not on file  . Years of education: Not on file  . Highest education level: Not on file  Occupational History  . Not on file  Tobacco Use  . Smoking status: Never Smoker  . Smokeless tobacco: Never Used  Vaping Use  . Vaping Use: Never used  Substance and Sexual Activity  . Alcohol use: No  . Drug use: No  . Sexual activity: Not on file  Other Topics Concern  . Not on file  Social History Narrative   Social History  Diet? n/a      Do you drink/eat things with caffeine? Yes   Marital status?      Divorced                               What year were you married? 1962      Do you live in a house, apartment, assisted living, condo, trailer, etc.? house      Is it one or more stories? One story      How many persons live in your home? 1      Do you have any pets in your home? (please list) none       Highest level of education completed? 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               sometimes                       Type & how often? Walking       Advanced Directives      Do you have a living will? No       Do you have a DNR form?                                   If not, do you want to discuss one?  No       Do you have signed POA/HPOA for forms?       Functional Status      Do you have difficulty bathing or dressing yourself? No       Do you have difficulty preparing food or eating? No       Do you have difficulty managing your medications? No       Do you have difficulty managing your finances? No       Do you have difficulty affording your medications? No       Social Determinants of Radio broadcast assistant Strain: Not on file  Food Insecurity: Not on file  Transportation Needs: Not on file  Physical Activity: Not on file  Stress: Not on file  Social Connections: Not on file  Intimate Partner Violence: Not on file    Allergies  Allergen Reactions  . Donepezil Hcl     Stomach cramps     Current Outpatient Medications  Medication Sig Dispense Refill  . amLODipine (NORVASC) 10 MG tablet TAKE 1 TABLET(10 MG) BY MOUTH DAILY 90 tablet 1  . dorzolamide-timolol (COSOPT) 22.3-6.8 MG/ML ophthalmic solution Place 1 drop into both eyes 2 (two) times daily.    . Lancets Ultra Thin 30G MISC 100 each by Does not apply route 3 (three) times a week. 100 each 0  . levothyroxine (SYNTHROID) 75 MCG tablet Take 1 tablet (75 mcg total) by mouth daily. 90 tablet 1  . lisinopril (ZESTRIL) 2.5 MG tablet Take 1 tablet (2.5 mg total) by mouth daily. 90 tablet 1  . loratadine (CLARITIN) 10 MG tablet Take 1 tablet (10 mg total) by mouth daily. 30 tablet 0  . melatonin 3 MG TABS tablet Take 1 tablet (3 mg total) by mouth at bedtime. May repeat x 1 dose if still unable to  sleep  0  . metFORMIN (GLUCOPHAGE) 500 MG tablet Take 1 tablet (500 mg total) by mouth 2 (two) times daily with a meal. 90 tablet 1  . metoprolol tartrate (LOPRESSOR) 50 MG tablet Take 1 tablet (50 mg total) by mouth daily. 90 tablet 1   No current facility-administered medications for this visit.    REVIEW OF SYSTEMS:  [X]  denotes positive finding, [ ]  denotes negative  finding Cardiac  Comments:  Chest pain or chest pressure:    Shortness of breath upon exertion:    Short of breath when lying flat:    Irregular heart rhythm:        Vascular    Pain in calf, thigh, or hip brought on by ambulation:    Pain in feet at night that wakes you up from your sleep:     Blood clot in your veins:    Leg swelling:         Pulmonary    Oxygen at home:    Productive cough:     Wheezing:         Neurologic    Sudden weakness in arms or legs:     Sudden numbness in arms or legs:     Sudden onset of difficulty speaking or slurred speech:    Temporary loss of vision in one eye:     Problems with dizziness:         Gastrointestinal    Blood in stool:     Vomited blood:         Genitourinary    Burning when urinating:     Blood in urine:        Psychiatric    Major depression:         Hematologic    Bleeding problems:    Problems with blood clotting too easily:        Skin    Rashes or ulcers:        Constitutional    Fever or chills:     PHYSICAL EXAM:   Vitals:   05/21/21 0835  BP: (!) 148/64  Pulse: 85  Resp: 16  Temp: 98 F (36.7 C)  SpO2: 97%  Weight: 113 lb (51.3 kg)  Height: 5\' 3"  (1.6 m)    GENERAL: The patient is a well-nourished female, in no acute distress. The vital signs are documented above. CARDIAC: There is a regular rate and rhythm.  VASCULAR: I do not detect carotid bruits. She has palpable femoral, popliteal, and dorsalis pedis pulses bilaterally. She has no significant lower extremity swelling. PULMONARY: There is good air exchange bilaterally without wheezing or rales. ABDOMEN: Soft and non-tender with normal pitched bowel sounds.  I do not palpate an abdominal aortic aneurysm. MUSCULOSKELETAL: There are no major deformities or cyanosis. NEUROLOGIC: No focal weakness or paresthesias are detected. SKIN: There are no ulcers or rashes noted. PSYCHIATRIC: The patient has a normal affect.  DATA:    LABS: I  reviewed her labs from 04/22/2021.  Her GFR is 38.  Creatinine is 1.48.  This would suggest stage III chronic kidney disease.   CT ABDOMEN PELVIS: I reviewed the CT abdomen and pelvis that was done on 04/05/2021.  This showed evidence of blood in the transverse colon.  She had sigmoid diverticulosis.  A chronic penetrating ulcer of the distal descending thoracic aorta was redemonstrated.  Of note her iliac arteries were patent on her CT of the pelvis.  CT CHEST: This shows the penetrating ulcer  of the descending thoracic aorta near the GE junction.  There is no evidence of dissection or intramural hematoma.

## 2021-05-22 ENCOUNTER — Telehealth: Payer: Self-pay | Admitting: *Deleted

## 2021-05-22 NOTE — Telephone Encounter (Signed)
Pts daughter called with questions about pts new diagnosis of aortic ulcer. Explained the nature of the condition and treatment options. Daughter voiced understanding.

## 2021-05-25 ENCOUNTER — Other Ambulatory Visit: Payer: Self-pay

## 2021-05-25 DIAGNOSIS — J3489 Other specified disorders of nose and nasal sinuses: Secondary | ICD-10-CM

## 2021-05-27 MED ORDER — LORATADINE 10 MG PO TABS: 10.0000 mg | ORAL_TABLET | Freq: Every day | ORAL | 0 refills | Status: DC

## 2021-06-14 ENCOUNTER — Other Ambulatory Visit: Payer: Self-pay | Admitting: Family

## 2021-07-01 ENCOUNTER — Other Ambulatory Visit: Payer: Self-pay | Admitting: Family

## 2021-07-01 DIAGNOSIS — E119 Type 2 diabetes mellitus without complications: Secondary | ICD-10-CM

## 2021-07-05 ENCOUNTER — Other Ambulatory Visit: Payer: Self-pay | Admitting: Family

## 2021-07-05 DIAGNOSIS — J3489 Other specified disorders of nose and nasal sinuses: Secondary | ICD-10-CM

## 2021-07-21 ENCOUNTER — Other Ambulatory Visit: Payer: Self-pay

## 2021-07-21 ENCOUNTER — Other Ambulatory Visit: Payer: Medicare PPO

## 2021-07-21 DIAGNOSIS — E119 Type 2 diabetes mellitus without complications: Secondary | ICD-10-CM | POA: Diagnosis not present

## 2021-07-21 DIAGNOSIS — E039 Hypothyroidism, unspecified: Secondary | ICD-10-CM | POA: Diagnosis not present

## 2021-07-21 DIAGNOSIS — E785 Hyperlipidemia, unspecified: Secondary | ICD-10-CM | POA: Diagnosis not present

## 2021-07-21 DIAGNOSIS — E1169 Type 2 diabetes mellitus with other specified complication: Secondary | ICD-10-CM | POA: Diagnosis not present

## 2021-07-21 DIAGNOSIS — I10 Essential (primary) hypertension: Secondary | ICD-10-CM | POA: Diagnosis not present

## 2021-07-22 ENCOUNTER — Encounter: Payer: Self-pay | Admitting: Family

## 2021-07-22 ENCOUNTER — Ambulatory Visit: Payer: Medicare PPO | Admitting: Family

## 2021-07-22 ENCOUNTER — Emergency Department (HOSPITAL_COMMUNITY)
Admission: EM | Admit: 2021-07-22 | Discharge: 2021-07-23 | Disposition: A | Discharge status: Home / Self Care | Payer: Medicare PPO | Attending: Physician Assistant | Admitting: Physician Assistant

## 2021-07-22 VITALS — BP 132/78 | HR 63 | Temp 96.9°F | Ht 63.0 in | Wt 105.4 lb

## 2021-07-22 DIAGNOSIS — Z5321 Procedure and treatment not carried out due to patient leaving prior to being seen by health care provider: Secondary | ICD-10-CM | POA: Insufficient documentation

## 2021-07-22 DIAGNOSIS — R109 Unspecified abdominal pain: Secondary | ICD-10-CM | POA: Insufficient documentation

## 2021-07-22 DIAGNOSIS — E785 Hyperlipidemia, unspecified: Secondary | ICD-10-CM

## 2021-07-22 DIAGNOSIS — E1169 Type 2 diabetes mellitus with other specified complication: Secondary | ICD-10-CM | POA: Diagnosis not present

## 2021-07-22 DIAGNOSIS — I10 Essential (primary) hypertension: Secondary | ICD-10-CM

## 2021-07-22 DIAGNOSIS — R634 Abnormal weight loss: Secondary | ICD-10-CM | POA: Diagnosis not present

## 2021-07-22 DIAGNOSIS — N1832 Chronic kidney disease, stage 3b: Secondary | ICD-10-CM

## 2021-07-22 DIAGNOSIS — R531 Weakness: Secondary | ICD-10-CM | POA: Insufficient documentation

## 2021-07-22 DIAGNOSIS — E119 Type 2 diabetes mellitus without complications: Secondary | ICD-10-CM | POA: Diagnosis not present

## 2021-07-22 DIAGNOSIS — D649 Anemia, unspecified: Secondary | ICD-10-CM | POA: Diagnosis not present

## 2021-07-22 DIAGNOSIS — E039 Hypothyroidism, unspecified: Secondary | ICD-10-CM

## 2021-07-22 LAB — URINALYSIS, ROUTINE W REFLEX MICROSCOPIC
Bilirubin Urine: NEGATIVE
Glucose, UA: NEGATIVE mg/dL
Ketones, ur: NEGATIVE mg/dL
Nitrite: NEGATIVE
Protein, ur: 30 mg/dL — AB
Specific Gravity, Urine: 1.011 (ref 1.005–1.030)
WBC, UA: >50 WBC/hpf — ABNORMAL HIGH (ref 0–5)
pH: 6 (ref 5.0–8.0)

## 2021-07-22 LAB — CBC WITH DIFFERENTIAL/PLATELET
Abs Immature Granulocytes: 0.01 10*3/uL (ref 0.00–0.07)
Absolute Monocytes: 470 cells/uL (ref 200–950)
Basophils Absolute: 29 cells/uL (ref 0–200)
Basophils Absolute: 0 10*3/uL (ref 0.0–0.1)
Basophils Relative: 0.6 %
Basophils Relative: 1 %
Eosinophils Absolute: 600 cells/uL — ABNORMAL HIGH (ref 15–500)
Eosinophils Absolute: 0.5 10*3/uL (ref 0.0–0.5)
Eosinophils Relative: 12.5 %
Eosinophils Relative: 10 %
HCT: 37.7 % (ref 35.0–45.0)
HCT: 35.9 % — ABNORMAL LOW (ref 36.0–46.0)
Hemoglobin: 12 g/dL (ref 11.7–15.5)
Hemoglobin: 11.5 g/dL — ABNORMAL LOW (ref 12.0–15.0)
Immature Granulocytes: 0 %
Lymphocytes Relative: 31 %
Lymphs Abs: 1171 cells/uL (ref 850–3900)
Lymphs Abs: 1.5 10*3/uL (ref 0.7–4.0)
MCH: 26.7 pg — ABNORMAL LOW (ref 27.0–33.0)
MCH: 27 pg (ref 26.0–34.0)
MCHC: 31.8 g/dL — ABNORMAL LOW (ref 32.0–36.0)
MCHC: 32 g/dL (ref 30.0–36.0)
MCV: 84 fL (ref 80.0–100.0)
MCV: 84.3 fL (ref 80.0–100.0)
MPV: 9.8 fL (ref 7.5–12.5)
Monocytes Absolute: 0.5 10*3/uL (ref 0.1–1.0)
Monocytes Relative: 9.8 %
Monocytes Relative: 9 %
Neutro Abs: 2530 cells/uL (ref 1500–7800)
Neutro Abs: 2.4 10*3/uL (ref 1.7–7.7)
Neutrophils Relative %: 52.7 %
Neutrophils Relative %: 49 %
Platelets: 273 10*3/uL (ref 140–400)
Platelets: 276 10*3/uL (ref 150–400)
RBC: 4.49 10*6/uL (ref 3.80–5.10)
RBC: 4.26 MIL/uL (ref 3.87–5.11)
RDW: 14.8 % (ref 11.0–15.0)
RDW: 17 % — ABNORMAL HIGH (ref 11.5–15.5)
Total Lymphocyte: 24.4 %
WBC: 4.8 10*3/uL (ref 3.8–10.8)
WBC: 4.8 10*3/uL (ref 4.0–10.5)
nRBC: 0 % (ref 0.0–0.2)

## 2021-07-22 LAB — LIPID PANEL
Cholesterol: 188 mg/dL (ref <200)
HDL: 74 mg/dL (ref >=50)
LDL Cholesterol (Calc): 97 mg/dL (calc)
Non-HDL Cholesterol (Calc): 114 mg/dL (calc) (ref <130)
Total CHOL/HDL Ratio: 2.5 (calc) (ref <5.0)
Triglycerides: 80 mg/dL (ref <150)

## 2021-07-22 LAB — COMPREHENSIVE METABOLIC PANEL
ALT: 13 U/L (ref 0–44)
AST: 20 U/L (ref 15–41)
Albumin: 3.8 g/dL (ref 3.5–5.0)
Alkaline Phosphatase: 47 U/L (ref 38–126)
Anion gap: 12 (ref 5–15)
BUN: 27 mg/dL — ABNORMAL HIGH (ref 8–23)
CO2: 22 mmol/L (ref 22–32)
Calcium: 9.5 mg/dL (ref 8.9–10.3)
Chloride: 105 mmol/L (ref 98–111)
Creatinine, Ser: 1.97 mg/dL — ABNORMAL HIGH (ref 0.44–1.00)
GFR, Estimated: 25 mL/min — ABNORMAL LOW (ref >60)
Glucose, Bld: 104 mg/dL — ABNORMAL HIGH (ref 70–99)
Potassium: 4.6 mmol/L (ref 3.5–5.1)
Sodium: 139 mmol/L (ref 135–145)
Total Bilirubin: 0.2 mg/dL — ABNORMAL LOW (ref 0.3–1.2)
Total Protein: 7.4 g/dL (ref 6.5–8.1)

## 2021-07-22 LAB — COMPLETE METABOLIC PANEL WITH GFR
AG Ratio: 1.3 (calc) (ref 1.0–2.5)
ALT: 8 U/L (ref 6–29)
AST: 15 U/L (ref 10–35)
Albumin: 4.1 g/dL (ref 3.6–5.1)
Alkaline phosphatase (APISO): 49 U/L (ref 37–153)
BUN/Creatinine Ratio: 18 (calc) (ref 6–22)
BUN: 27 mg/dL — ABNORMAL HIGH (ref 7–25)
CO2: 24 mmol/L (ref 20–32)
Calcium: 9.6 mg/dL (ref 8.6–10.4)
Chloride: 106 mmol/L (ref 98–110)
Creat: 1.52 mg/dL — ABNORMAL HIGH (ref 0.60–0.95)
Globulin: 3.1 g/dL (calc) (ref 1.9–3.7)
Glucose, Bld: 85 mg/dL (ref 65–99)
Potassium: 4.2 mmol/L (ref 3.5–5.3)
Sodium: 140 mmol/L (ref 135–146)
Total Bilirubin: 0.3 mg/dL (ref 0.2–1.2)
Total Protein: 7.2 g/dL (ref 6.1–8.1)
eGFR: 34 mL/min/{1.73_m2} — ABNORMAL LOW (ref >=60)

## 2021-07-22 LAB — HEMOGLOBIN A1C
Hgb A1c MFr Bld: 5.9 % of total Hgb — ABNORMAL HIGH (ref <5.7)
Mean Plasma Glucose: 123 mg/dL
eAG (mmol/L): 6.8 mmol/L

## 2021-07-22 LAB — TSH
TSH: 1.4 mIU/L (ref 0.40–4.50)
TSH: 1.566 u[IU]/mL (ref 0.350–4.500)

## 2021-07-22 LAB — LIPASE, BLOOD: Lipase: 30 U/L (ref 11–51)

## 2021-07-22 NOTE — ED Triage Notes (Signed)
Pt c/o weakness, "not feeling good all over" today. Called PCP today, advised for 81mofollow up. Pt states she was advised to come to ED, poor historian due to hx memory impairment.

## 2021-07-22 NOTE — Patient Instructions (Signed)
-   stop Metformin due to blood sugars are well controlled,weight loss and worsening kidney function   - Follow up in 2 months to recheck weight and CMP

## 2021-07-22 NOTE — ED Provider Notes (Signed)
Emergency Medicine Provider Triage Evaluation Note  Alejandra Davis , a 85 y.o. female  was evaluated in triage.  Pt complains of abdominal pain and generalized weakness x 1 day. Admits to "feeling bad all over."  She saw pcp today however cannot remember what was talked about. She admits to weight loss over the last 2 months  Review of Systems  Positive: Generalized weakness, abdominal pain Negative: Fever, chills, headache, chest pain, nausea, emesis  Physical Exam  BP 116/90 (BP Location: Left Arm)   Pulse (!) 106   Temp 97.6 F (36.4 C) (Oral)   Resp 16   SpO2 100%  Gen:   Awake, no distress   Resp:  Normal effort  MSK:   Moves extremities without difficulty  Other:  No abdominal tenderness,  Medical Decision Making  Medically screening exam initiated at 7:38 PM.  Appropriate orders placed.  YICHEN COTRELL was informed that the remainder of the evaluation will be completed by another provider, this initial triage assessment does not replace that evaluation, and the importance of remaining in the ED until their evaluation is complete.  Abdominal labs and TSH ordered.   Portions of this note were generated with Lobbyist. Dictation errors may occur despite best attempts at proofreading.    Barrie Folk, PA-C 07/22/21 Lattie Corns    Daleen Bo, MD 07/22/21 [phone]

## 2021-07-22 NOTE — Progress Notes (Signed)
Provider: Marlowe Sax FNP-C   Truth Barot, Nelda Bucks, NP  Patient Care Team: Alper Guilmette, Nelda Bucks, NP as PCP - General (Family Medicine)  Extended Emergency Contact Information Primary Emergency Contact: Wedeking,[name] Address: [address]          [place], [place] [address] [place] Phone: [phone] Relation: Daughter Secondary Emergency Contact: Mermelstein,Mitchell Address: [place]          [place] [address] [place] Mobile Phone: [phone] Relation: Son  Code Status:  Full Code  Goals of care: Advanced Directive information Advanced Directives 07/22/2021  Does Patient Have a Medical Advance Directive? Yes  Type of Advance Directive [place]  Does patient want to make changes to medical advance directive? No - Patient declined  Copy of [place] in Chart? Yes - validated most recent copy scanned in chart (See row information)  Would patient like information on creating a medical advance directive? -     Chief Complaint  Patient presents with   Medical Management of Chronic Issues    4 month follow-up and discuss labs (copy printed). Discuss weight loss. Discuss lancets on medication list, patient states she was not told to test blood sugar. Patient is not sure about several medications on list and will check bottles when she gets home. Discuss need for covid vaccine # 4, eye exam and foot exam (today). Here with caregiver, Caroll.     HPI:  Pt is a 85 y.o. female seen today for 4 months evaluation for medical management of chronic diseases. She is here with care giver Caroll today. She denies any acute issues. Daughter had send Mychart message concerned with pt weight loss.Her weight today indicates 8 lbs weight loss over two months.she states does not skip meals.has been going to the [facility] to walk but not lately due to hot weather.  No home CBG readings for evaluation. Recent lab results  reviewed and discussed with patient during visit.all labs unremarkable except renal function slightly worsen BUN 27,CR 1.52 previous 1.48  And Hemoglobin A1C has improved 5.9 previous was 6.4; 7.1. No fall episode or recent hospital admission.  Due for her annual eye exam.states does not recall ophthalmologist name request referral to Ophthalmologist.   Past Medical History:  Diagnosis Date   Chicken pox    Cystocele    Diabetes mellitus without complication ([place])    Glaucoma    Hypertension    Hypertensive retinopathy    OU   Neuromuscular disorder ([place])    Dementia    Thyroid disease    hypothyroidism   Past Surgical History:  Procedure Laterality Date   ABDOMINAL HYSTERECTOMY  1990   TAH BSO   CATARACT EXTRACTION Bilateral    Dr. [name]   COLONOSCOPY WITH PROPOFOL N/A 04/08/2021   Procedure: COLONOSCOPY WITH PROPOFOL;  Surgeon: [name], MD;  Location: [facility];  Service: Endoscopy;  Laterality: N/A;   EYE SURGERY Bilateral    Cat Sx OU   OOPHORECTOMY     BSO   Vaginal Bx     Papilloma    Allergies  Allergen Reactions   Donepezil Hcl     Stomach cramps     Allergies as of 07/22/2021       Reactions   Donepezil Hcl    Stomach cramps         Medication List        Accurate as of July 22, 2021 11:34  AM. If you have any questions, ask your nurse or doctor.          amLODipine 10 MG tablet Commonly known as: NORVASC TAKE 1 TABLET(10 MG) BY MOUTH DAILY   dorzolamide-timolol 22.3-6.8 MG/ML ophthalmic solution Commonly known as: COSOPT Place 1 drop into both eyes 2 (two) times daily.   Lancets Ultra Thin 30G Misc 100 each by Does not apply route 3 (three) times a week.   levothyroxine 75 MCG tablet Commonly known as: SYNTHROID Take 1 tablet (75 mcg total) by mouth daily.   lisinopril 2.5 MG tablet Commonly known as: Zestril Take 1 tablet (2.5 mg total) by mouth daily.   loratadine 10 MG tablet Commonly known as: CLARITIN TAKE 1  TABLET(10 MG) BY MOUTH DAILY   melatonin 3 MG Tabs tablet Take 1 tablet (3 mg total) by mouth at bedtime. May repeat x 1 dose if still unable to sleep   metFORMIN 500 MG tablet Commonly known as: GLUCOPHAGE TAKE 1 TABLET(500 MG) BY MOUTH TWICE DAILY WITH A MEAL   metoprolol tartrate 50 MG tablet Commonly known as: LOPRESSOR Take 1 tablet (50 mg total) by mouth daily.        Review of Systems  Constitutional:  Positive for unexpected weight change. Negative for appetite change, chills, fatigue and fever.       Weight loss   HENT:  Negative for congestion, dental problem, ear discharge, ear pain, facial swelling, hearing loss, nosebleeds, postnasal drip, rhinorrhea, sinus pressure, sinus pain, sneezing, sore throat, tinnitus and trouble swallowing.        Wears dentures   Eyes:  Negative for pain, discharge, redness, itching and visual disturbance.  Respiratory:  Negative for cough, chest tightness, shortness of breath and wheezing.   Cardiovascular:  Negative for chest pain, palpitations and leg swelling.  Gastrointestinal:  Negative for abdominal distention, abdominal pain, blood in stool, constipation, diarrhea, nausea and vomiting.  Endocrine: Negative for cold intolerance, heat intolerance, polydipsia, polyphagia and polyuria.  Genitourinary:  Negative for difficulty urinating, dysuria, flank pain, frequency and urgency.  Musculoskeletal:  Negative for arthralgias, back pain, gait problem, joint swelling, myalgias, neck pain and neck stiffness.  Skin:  Negative for color change, pallor, rash and wound.  Neurological:  Negative for dizziness, syncope, speech difficulty, weakness, light-headedness, numbness and headaches.  Hematological:  Does not bruise/bleed easily.  Psychiatric/Behavioral:  Negative for agitation, behavioral problems, confusion, hallucinations, self-injury, sleep disturbance and suicidal ideas. The patient is not nervous/anxious.        Forgetful     Immunization History  Administered Date(s) Administered   Fluad Quad(high Dose 65+) 09/17/2020   Influenza, High Dose Seasonal PF 12/20/2017, 09/25/2019   Influenza-Unspecified 12/20/2017   PFIZER(Purple Top)SARS-COV-2 Vaccination 01/05/2020, 02/05/2020, 11/13/2020   Pneumococcal Conjugate-13 06/24/2016   Pneumococcal Polysaccharide-23 03/13/2020   Tdap 03/25/2021   Zoster Recombinat (Shingrix) 02/07/2018, 05/18/2018   Pertinent  Health Maintenance Due  Topic Date Due   OPHTHALMOLOGY EXAM  12/18/2020   FOOT EXAM  02/22/2021   INFLUENZA VACCINE  07/28/2021   HEMOGLOBIN A1C  01/21/2022   DEXA SCAN  Completed   PNA vac Low Risk Adult  Completed   Fall Risk  07/22/2021 04/22/2021 03/25/2021 03/04/2021 02/07/2021  Falls in the past year? 0 0 0 0 0  Number falls in past yr: 0 0 0 0 0  Injury with Fall? 0 0 0 0 0  Risk for fall due to : No Fall Risks - - - -  Follow up  Falls evaluation completed - - - -   Functional Status Survey:    Vitals:   07/22/21 1118  BP: 132/78  Pulse: 63  Temp: (!) 96.9 F (36.1 C)  TempSrc: Temporal  SpO2: 99%  Weight: 105 lb 6.4 oz (47.8 kg)  Height: 5' 3"  (1.6 m)   Body mass index is 18.67 kg/m. Physical Exam Vitals reviewed.  Constitutional:      General: She is not in acute distress.    Appearance: Normal appearance. She is underweight. She is not ill-appearing or diaphoretic.  HENT:     Head: Normocephalic.     Right Ear: Tympanic membrane, ear canal and external ear normal. There is no impacted cerumen.     Left Ear: Tympanic membrane, ear canal and external ear normal. There is no impacted cerumen.     Nose: Nose normal. No congestion or rhinorrhea.     Mouth/Throat:     Mouth: Mucous membranes are moist.     Pharynx: Oropharynx is clear. No oropharyngeal exudate or posterior oropharyngeal erythema.     Comments: Upper dentures in place  Eyes:     General: No scleral icterus.       Right eye: No discharge.        Left eye: No  discharge.     Extraocular Movements: Extraocular movements intact.     Conjunctiva/sclera: Conjunctivae normal.     Pupils: Pupils are equal, round, and reactive to light.  Neck:     Vascular: No carotid bruit.  Cardiovascular:     Rate and Rhythm: Normal rate and regular rhythm.     Pulses: Normal pulses.     Heart sounds: Murmur heard.    No friction rub. No gallop.  Pulmonary:     Effort: Pulmonary effort is normal. No respiratory distress.     Breath sounds: Normal breath sounds. No wheezing, rhonchi or rales.  Chest:     Chest wall: No tenderness.  Abdominal:     General: Bowel sounds are normal. There is no distension.     Palpations: Abdomen is soft. There is no mass.     Tenderness: There is no abdominal tenderness. There is no right CVA tenderness, left CVA tenderness, guarding or rebound.  Musculoskeletal:        General: No swelling or tenderness. Normal range of motion.     Cervical back: Normal range of motion. No rigidity or tenderness.     Right lower leg: No edema.     Left lower leg: No edema.  Lymphadenopathy:     Cervical: No cervical adenopathy.  Skin:    General: Skin is warm and dry.     Coloration: Skin is not pale.     Findings: No bruising, erythema, lesion or rash.  Neurological:     Mental Status: She is alert and oriented to person, place, and time.     Cranial Nerves: No cranial nerve deficit.     Sensory: No sensory deficit.     Motor: No weakness.     Coordination: Coordination normal.     Gait: Gait normal.  Psychiatric:        Mood and Affect: Mood normal.        Speech: Speech normal.        Behavior: Behavior normal.        Thought Content: Thought content normal.        Cognition and Memory: Memory is impaired.        Judgment: Judgment  normal.    Labs reviewed: Recent Labs    04/06/21 0518 04/07/21 0515 04/09/21 0907 04/10/21 0635 04/22/21 1056 07/21/21 0808  NA 142 138   < > 138 141 140  K 3.8 4.1   < > 3.3* 4.5 4.2  CL  111 108   < > 107 106 106  CO2 23 23   < > 23 26 24   GLUCOSE 105* 86   < > 114* 81 85  BUN 17 11   < > 13 24 27*  CREATININE 0.96 0.90   < > 1.23* 1.48* 1.52*  CALCIUM 8.0* 8.4*   < > 8.7* 9.6 9.6  MG 1.5* 1.8  --   --   --   --   PHOS  --  2.8  --   --   --   --    < > = values in this interval not displayed.   Recent Labs    04/05/21 1938 04/06/21 0518 04/10/21 0141 07/21/21 0808  AST 20 24 22 15   ALT 18 17 18 8   ALKPHOS 54 48 57  --   BILITOT 0.2* 1.0 0.4 0.3  PROT 6.6 5.8* 6.8 7.2  ALBUMIN 3.2* 3.0* 3.6  --    Recent Labs    04/10/21 0141 04/10/21 0635 04/11/21 0529 04/22/21 1056 07/21/21 0808  WBC 5.7  --   --  6.4 4.8  NEUTROABS 3.5  --   --  3,629 2,530  HGB 10.7*   < > 10.9* 11.1* 12.0  HCT 32.4*   < > 32.8* 34.9* 37.7  MCV 85.5  --   --  86.2 84.0  PLT 219  --   --  306 273   < > = values in this interval not displayed.   Lab Results  Component Value Date   TSH 1.40 07/21/2021   Lab Results  Component Value Date   HGBA1C 5.9 (H) 07/21/2021   Lab Results  Component Value Date   CHOL 188 07/21/2021   HDL 74 07/21/2021   LDLCALC 97 07/21/2021   TRIG 80 07/21/2021   CHOLHDL 2.5 07/21/2021    Significant Diagnostic Results in last 30 days:  No results found.  Assessment/Plan 1. Essential hypertension B/p well controlled. Continue on amlodipine and Lisinopril  - CBC with Differential/Platelet; Future - CMP with eGFR([facility]); Future - TSH; Future  2. Controlled type 2 diabetes mellitus without complication, without long-term current use of insulin (HCC) Lab Results  Component Value Date   HGBA1C 5.9 (H) 07/21/2021  Well controlled. D/c metformin due to weight loss and decline in renal function. -Monofilament foot exam normal today. - refer to Ophthalmologist for annual eye exam  - continue on low dose ACE inhibitor   3. Hyperlipidemia associated with type 2 diabetes mellitus ([place]) LDL at goal  - continue with dietary modification and  exercise  - Lipid panel; Future  4. Stage 3b chronic kidney disease ([place]) CR slightly worsen with recent labs with high BUN possible dehydration.Encouraged to increase fluid intake to 6-8 glasses daily. D/c metformin as above  Will recheck CMP in 2 months.  - CMP with eGFR([facility]); Future  5. Anemia, unspecified type Hgb level has improved from previous  - continue to encourage foods high in iron - CBC with Differential/Platelet; Future  6. Acquired hypothyroidism Lab Results  Component Value Date   TSH 1.40 07/21/2021  Continue on levothyroxine 75 mcg tablet daily  - TSH; Future   7. Abnormal weight loss Has had  8 lbs weight loss over 2 months since last visit. D/c metformin as above Continue to monitor weight.Reports good appetite.eating three meals. - follow up in 2 months to recheck  Consider protein supplement if continues to loss weight despite d/c metformin.  Family/ staff Communication: Reviewed plan of care with patient and care giver Caroll verbalized understanding.   Labs/tests ordered:  - CBC with Differential/Platelet - CMP with eGFR([facility]) - TSH - Hgb A1C - Lipid panel  Next Appointment : 6 months for medical management of chronic issues.Fasting Labs prior to visit. 2 months follow up for weight and CMP check.     [name], NP

## 2021-07-23 NOTE — Telephone Encounter (Signed)
Patient is wanting explanation of Hospital labs.  Please Advise.

## 2021-08-16 ENCOUNTER — Other Ambulatory Visit: Payer: Self-pay | Admitting: Family

## 2021-08-16 DIAGNOSIS — J3489 Other specified disorders of nose and nasal sinuses: Secondary | ICD-10-CM

## 2021-08-27 ENCOUNTER — Ambulatory Visit: Payer: Medicare PPO | Admitting: Adult Health

## 2021-08-27 ENCOUNTER — Other Ambulatory Visit: Payer: Self-pay

## 2021-08-27 VITALS — BP 120/80 | HR 64 | Temp 96.8°F | Ht 63.0 in | Wt 109.0 lb

## 2021-08-27 DIAGNOSIS — R21 Rash and other nonspecific skin eruption: Secondary | ICD-10-CM

## 2021-08-27 DIAGNOSIS — E119 Type 2 diabetes mellitus without complications: Secondary | ICD-10-CM | POA: Diagnosis not present

## 2021-08-27 MED ORDER — MUPIROCIN CALCIUM 2 % EX CREA: 1.0000 "application " | TOPICAL_CREAM | Freq: Two times a day (BID) | CUTANEOUS | 0 refills | Status: DC

## 2021-08-27 MED ORDER — TRIAMCINOLONE ACETONIDE 0.5 % EX OINT: 1.0000 "application " | TOPICAL_OINTMENT | Freq: Two times a day (BID) | CUTANEOUS | 0 refills | Status: DC

## 2021-08-27 NOTE — Progress Notes (Signed)
[facility]    Provider:   Durenda Age  DNP  Code Status:  Full Code  Goals of Care:  Advanced Directives 07/22/2021  Does Patient Have a Medical Advance Directive? Yes  Type of Advance Directive [place]  Does patient want to make changes to medical advance directive? No - Patient declined  Copy of [place] in Chart? Yes - validated most recent copy scanned in chart (See row information)  Would patient like information on creating a medical advance directive? -     Chief Complaint  Patient presents with   Acute Visit    Patient here for sore on ankle for about a week. Patient can't rcall injuring ankle in anyway. Ankle is swollen and tender to touch.     HPI: Patient is a 85 y.o. female seen today for an acute visit for left ankle with "spot". She has noticed that rash started a week ago. The spot is light black  size of a quarter coin and has small blisters. Patient said that sometimes it "bothers" her and so scratches it. She came today accompanied by her niece. Niece stated that she goes tot he backyard a lot. She lives by herself but her children comes to the house to help her. No reported fever nor shortness of breath. She is diet-controlled diabetic with A1C 6.4, 04/06/21 and  5.9 on 07/21/21.   Past Medical History:  Diagnosis Date   Chicken pox    Cystocele    Diabetes mellitus without complication (HCC)    Glaucoma    Hypertension    Hypertensive retinopathy    OU   Neuromuscular disorder ([place])    Dementia    Thyroid disease    hypothyroidism    Past Surgical History:  Procedure Laterality Date   ABDOMINAL HYSTERECTOMY  1990   TAH BSO   CATARACT EXTRACTION Bilateral    Dr. [name]   COLONOSCOPY WITH PROPOFOL N/A 04/08/2021   Procedure: COLONOSCOPY WITH PROPOFOL;  Surgeon: [name], MD;  Location: [facility];  Service: Endoscopy;  Laterality: N/A;   EYE SURGERY Bilateral    Cat Sx OU   OOPHORECTOMY      BSO   Vaginal Bx     Papilloma    Allergies  Allergen Reactions   Donepezil Hcl     Stomach cramps     Outpatient Encounter Medications as of 08/27/2021  Medication Sig   amLODipine (NORVASC) 10 MG tablet TAKE 1 TABLET(10 MG) BY MOUTH DAILY   ASPIRIN LOW DOSE 81 MG EC tablet Take 81 mg by mouth daily.   dorzolamide-timolol (COSOPT) 22.3-6.8 MG/ML ophthalmic solution Place 1 drop into both eyes 2 (two) times daily.   Lancets Ultra Thin 30G MISC 100 each by Does not apply route 3 (three) times a week.   levothyroxine (SYNTHROID) 75 MCG tablet Take 1 tablet (75 mcg total) by mouth daily.   lisinopril (ZESTRIL) 2.5 MG tablet Take 1 tablet (2.5 mg total) by mouth daily.   loratadine (CLARITIN) 10 MG tablet TAKE 1 TABLET(10 MG) BY MOUTH DAILY   melatonin 3 MG TABS tablet Take 1 tablet (3 mg total) by mouth at bedtime. May repeat x 1 dose if still unable to sleep   metoprolol tartrate (LOPRESSOR) 50 MG tablet TAKE 1 TABLET(50 MG) BY MOUTH DAILY   mupirocin cream (BACTROBAN) 2 % Apply 1 application topically 2 (two) times daily for 8 days.   triamcinolone ointment (KENALOG) 0.5 % Apply 1 application  topically 2 (two) times daily for 14 days.   No facility-administered encounter medications on file as of 08/27/2021.    Review of Systems:  Review of Systems  Constitutional:  Negative for activity change, appetite change and chills.  HENT:  Negative for congestion.   Respiratory:  Negative for shortness of breath.   Cardiovascular:  Negative for leg swelling.  Gastrointestinal:  Negative for abdominal pain.  Endocrine: Negative.   Genitourinary:  Negative for difficulty urinating.  Musculoskeletal: Negative.   Skin:  Positive for rash.  Neurological:  Negative for dizziness, weakness and numbness.  Psychiatric/Behavioral: Negative.     Health Maintenance  Topic Date Due   OPHTHALMOLOGY EXAM  12/18/2020   FOOT EXAM  02/22/2021   COVID-19 Vaccine (4 - Booster for Pfizer series)  03/13/2021   INFLUENZA VACCINE  07/28/2021   HEMOGLOBIN A1C  01/21/2022   TETANUS/TDAP  03/26/2031   DEXA SCAN  Completed   PNA vac Low Risk Adult  Completed   Zoster Vaccines- Shingrix  Completed   HPV VACCINES  Aged Out    Physical Exam: Vitals:   08/27/21 1528  BP: 120/80  Pulse: 64  Temp: (!) 96.8 F (36 C)  SpO2: 98%  Weight: 109 lb (49.4 kg)  Height: '5\' 3"'$  (1.6 m)   Body mass index is 19.31 kg/m. Physical Exam Constitutional:      Appearance: Normal appearance.  HENT:     Head: Normocephalic and atraumatic.     Nose: Nose normal.  Cardiovascular:     Rate and Rhythm: Normal rate and regular rhythm.  Musculoskeletal:     Cervical back: Normal range of motion and neck supple.  Skin:    Findings: Rash present.     Comments: Left ankle with dry rash, pale black with small blisters (pinpoint size)  Neurological:     Mental Status: She is alert. Mental status is at baseline.  Psychiatric:        Behavior: Behavior normal.    Labs reviewed: Basic Metabolic Panel: Recent Labs    01/15/21 1034 03/24/21 0816 04/06/21 0518 04/07/21 0515 04/09/21 0907 04/22/21 1056 07/21/21 0808 07/22/21 1932  NA  --    < > 142 138   < > 141 140 139  K  --    < > 3.8 4.1   < > 4.5 4.2 4.6  CL  --    < > 111 108   < > 106 106 105  CO2  --    < > 23 23   < > '26 24 22  '$ GLUCOSE  --    < > 105* 86   < > 81 85 104*  BUN  --    < > 17 11   < > 24 27* 27*  CREATININE  --    < > 0.96 0.90   < > 1.48* 1.52* 1.97*  CALCIUM  --    < > 8.0* 8.4*   < > 9.6 9.6 9.5  MG  --   --  1.5* 1.8  --   --   --   --   PHOS  --   --   --  2.8  --   --   --   --   TSH 1.49  --   --   --   --   --  1.40 1.566   < > = values in this interval not displayed.   Liver Function Tests: Recent Labs    04/06/21  0518 04/10/21 0141 07/21/21 0808 07/22/21 1932  AST '24 22 15 20  '$ ALT '17 18 8 13  '$ ALKPHOS 48 57  --  47  BILITOT 1.0 0.4 0.3 0.2*  PROT 5.8* 6.8 7.2 7.4  ALBUMIN 3.0* 3.6  --  3.8   Recent  Labs    04/10/21 0141 07/22/21 1932  LIPASE 33 30   No results for input(s): AMMONIA in the last 8760 hours. CBC: Recent Labs    04/22/21 1056 07/21/21 0808 07/22/21 1932  WBC 6.4 4.8 4.8  NEUTROABS 3,629 2,530 2.4  HGB 11.1* 12.0 11.5*  HCT 34.9* 37.7 35.9*  MCV 86.2 84.0 84.3  PLT 306 273 276   Lipid Panel: Recent Labs    09/10/20 0802 03/24/21 0816 07/21/21 0808  CHOL 224* 204* 188  HDL 86 68 74  LDLCALC 122* 119* 97  TRIG 67 71 80  CHOLHDL 2.6 3.0 2.5   Lab Results  Component Value Date   HGBA1C 5.9 (H) 07/21/2021    Procedures since last visit: No results found.  Assessment/Plan  1. Rash -  keep area clean, will need to wear longer socks to cover the rash whenever she is at the backyard - mupirocin cream (BACTROBAN) 2 %; Apply 1 application topically 2 (two) times daily for 8 days.  Dispense: 15 g; Refill: 0 - triamcinolone ointment (KENALOG) 0.5 %; Apply 1 application topically 2 (two) times daily for 14 days.  Dispense: 28 g; Refill: 0 -  follow  up in the clinic if rash persist or gets worse   2. Controlled type 2 diabetes mellitus without complication, without long-term current use of insulin ([place]) Lab Results  Component Value Date   HGBA1C 5.9 (H) 07/21/2021   -   diet-controlled    Labs/tests ordered:   None  Next appt:  01/16/2022

## 2021-08-27 NOTE — Patient Instructions (Signed)
Rash, Adult  A rash is a change in the color of your skin. A rash can also change the way your skin feels. There are many different conditions and factors that can causea rash. Follow these instructions at home: The goal of treatment is to stop the itching and keep the rash from spreading. Watch for any changes in your symptoms. Let your doctor know about them. Followthese instructions to help with your condition: Medicine Take or apply over-the-counter and prescription medicines only as told by your doctor. These may include medicines: To treat red or swollen skin (corticosteroid creams). To treat itching. To treat an allergy (oral antihistamines). To treat very bad symptoms (oral corticosteroids).  Skin care Put cool cloths (compresses) on the affected areas. Do not scratch or rub your skin. Avoid covering the rash. Make sure that the rash is exposed to air as much as possible. Managing itching and discomfort Avoid hot showers or baths. These can make itching worse. A cold shower may help. Try taking a bath with: Epsom salts. You can get these at your local pharmacy or grocery store. Follow the instructions on the package. Baking soda. Pour a small amount into the bath as told by your doctor. Colloidal oatmeal. You can get this at your local pharmacy or grocery store. Follow the instructions on the package. Try putting baking soda paste onto your skin. Stir water into baking soda until it gets like a paste. Try putting on a lotion that relieves itchiness (calamine lotion). Keep cool and out of the sun. Sweating and being hot can make itching worse. General instructions  Rest as needed. Drink enough fluid to keep your pee (urine) pale yellow. Wear loose-fitting clothing. Avoid scented soaps, detergents, and perfumes. Use gentle soaps, detergents, perfumes, and other cosmetic products. Avoid anything that causes your rash. Keep a journal to help track what causes your rash. Write  down: What you eat. What cosmetic products you use. What you drink. What you wear. This includes jewelry. Keep all follow-up visits as told by your doctor. This is important.  Contact a doctor if: You sweat at night. You lose weight. You pee (urinate) more than normal. You pee less than normal, or you notice that your pee is a darker color than normal. You feel weak. You throw up (vomit). Your skin or the whites of your eyes look yellow (jaundice). Your skin: Tingles. Is numb. Your rash: Does not go away after a few days. Gets worse. You are: More thirsty than normal. More tired than normal. You have: New symptoms. Pain in your belly (abdomen). A fever. Watery poop (diarrhea). Get help right away if: You have a fever and your symptoms suddenly get worse. You start to feel mixed up (confused). You have a very bad headache or a stiff neck. You have very bad joint pains or stiffness. You have jerky movements that you cannot control (seizure). Your rash covers all or most of your body. The rash may or may not be painful. You have blisters that: Are on top of the rash. Grow larger. Grow together. Are painful. Are inside your nose or mouth. You have a rash that: Looks like purple pinprick-sized spots all over your body. Has a "bull's eye" or looks like a target. Is red and painful, causes your skin to peel, and is not from being in the sun too long. Summary A rash is a change in the color of your skin. A rash can also change the way your skin feels.   The goal of treatment is to stop the itching and keep the rash from spreading. Take or apply over-the-counter and prescription medicines only as told by your doctor. Contact a doctor if you have new symptoms or symptoms that get worse. Keep all follow-up visits as told by your doctor. This is important. This information is not intended to replace advice given to you by your health care provider. Make sure you discuss any  questions you have with your healthcare provider. Document Revised: 04/07/2019 Document Reviewed: 07/18/2018 Elsevier Patient Education  2022 [employer]  

## 2021-08-29 ENCOUNTER — Telehealth: Payer: Self-pay | Admitting: *Deleted

## 2021-08-29 MED ORDER — MUPIROCIN 2 % EX OINT: 1.0000 "application " | TOPICAL_OINTMENT | Freq: Two times a day (BID) | CUTANEOUS | 0 refills | Status: AC

## 2021-08-29 NOTE — Addendum Note (Signed)
Addended by: Durenda Age C on: 08/29/2021 10:25 AM   Modules accepted: Orders

## 2021-08-29 NOTE — Telephone Encounter (Signed)
Patient was seen on Wednesday 8/31 for rash and prescribed Mupirocin Cream.   They are wanting to know if you would send in a Rx for the Mupirocin Ointment instead because it is cheaper.   Please Advise.

## 2021-08-29 NOTE — Telephone Encounter (Signed)
I just sent in a prescription for Mupirocin ointment. Pls let patient know. Thanks.

## 2021-09-05 ENCOUNTER — Ambulatory Visit: Payer: Medicare PPO | Admitting: Podiatry

## 2021-09-05 ENCOUNTER — Other Ambulatory Visit: Payer: Self-pay

## 2021-09-05 DIAGNOSIS — E119 Type 2 diabetes mellitus without complications: Secondary | ICD-10-CM

## 2021-09-05 DIAGNOSIS — M79674 Pain in right toe(s): Secondary | ICD-10-CM

## 2021-09-05 DIAGNOSIS — M79675 Pain in left toe(s): Secondary | ICD-10-CM

## 2021-09-05 DIAGNOSIS — B351 Tinea unguium: Secondary | ICD-10-CM | POA: Diagnosis not present

## 2021-09-10 NOTE — Progress Notes (Signed)
  Subjective:  Patient ID: Alejandra Davis, female    DOB: Apr 29, 1936,  MRN: [id number]  Chief Complaint  Patient presents with   Diabetes    Diabetic foot care   85 y.o. female returns for the above complaint.  Patient presents with thickened elongated dystrophic toenails x10.  Patient would like to have them debrided down.  She denies any other acute complaints she is a type II diabetic with last A1c of 6.6.  She has not seen anyone else prior to seeing me.  Objective:  There were no vitals filed for this visit. Podiatric Exam: Vascular: dorsalis pedis and posterior tibial pulses are palpable bilateral. Capillary return is immediate. Temperature gradient is WNL. Skin turgor WNL  Sensorium: Normal Semmes Weinstein monofilament test. Normal tactile sensation bilaterally. Nail Exam: Pt has thick disfigured discolored nails with subungual debris noted bilateral entire nail hallux through fifth toenails.  Pain on palpation to the nails. Ulcer Exam: There is no evidence of ulcer or pre-ulcerative changes or infection. Orthopedic Exam: Muscle tone and strength are WNL. No limitations in general ROM. No crepitus or effusions noted. HAV  B/L.  Hammer toes 2-5  B/L. Skin: No Porokeratosis. No infection or ulcers    Assessment & Plan:   1. Type 2 diabetes mellitus without complication, without long-term current use of insulin (HCC)   2. Pain due to onychomycosis of toenails of both feet      Patient was evaluated and treated and all questions answered.  Bilateral bunions -I explained to the patient the etiology of bunions and various treatment options were extensively discussed.  I believe patient will benefit from conservative care with offloading shoe gear modification.  Patient states understanding and states she would like to just focus on conservative treatment options for now.  Onychomycosis with pain  -Nails palliatively debrided as below. -Educated on self-care  Procedure: Nail  Debridement Rationale: pain  Type of Debridement: manual, sharp debridement. Instrumentation: Nail nipper, rotary burr. Number of Nails: 10  Procedures and Treatment: Consent by patient was obtained for treatment procedures. The patient understood the discussion of treatment and procedures well. All questions were answered thoroughly reviewed. Debridement of mycotic and hypertrophic toenails, 1 through 5 bilateral and clearing of subungual debris. No ulceration, no infection noted.  Return Visit-Office Procedure: Patient instructed to return to the office for a follow up visit 3 months for continued evaluation and treatment.  Boneta Lucks, DPM    No follow-ups on file.

## 2021-10-06 ENCOUNTER — Other Ambulatory Visit: Payer: Self-pay

## 2021-10-06 DIAGNOSIS — J3489 Other specified disorders of nose and nasal sinuses: Secondary | ICD-10-CM

## 2021-10-06 DIAGNOSIS — F5101 Primary insomnia: Secondary | ICD-10-CM

## 2021-10-06 DIAGNOSIS — I10 Essential (primary) hypertension: Secondary | ICD-10-CM

## 2021-10-06 MED ORDER — METOPROLOL TARTRATE 50 MG PO TABS: ORAL_TABLET | ORAL | 1 refills | Status: AC

## 2021-10-06 MED ORDER — LISINOPRIL 2.5 MG PO TABS: 2.5000 mg | ORAL_TABLET | Freq: Every day | ORAL | 1 refills | Status: AC

## 2021-10-06 MED ORDER — LORATADINE 10 MG PO TABS: ORAL_TABLET | ORAL | 1 refills | Status: DC

## 2021-10-06 MED ORDER — MELATONIN 3 MG PO TABS: 3.0000 mg | ORAL_TABLET | Freq: Every day | ORAL | 1 refills | Status: DC

## 2021-10-06 MED ORDER — LEVOTHYROXINE SODIUM 75 MCG PO TABS: 75.0000 ug | ORAL_TABLET | Freq: Every day | ORAL | 1 refills | Status: AC

## 2021-10-06 MED ORDER — AMLODIPINE BESYLATE 10 MG PO TABS: ORAL_TABLET | ORAL | 1 refills | Status: DC

## 2021-10-13 DIAGNOSIS — I10 Essential (primary) hypertension: Secondary | ICD-10-CM

## 2021-10-14 ENCOUNTER — Other Ambulatory Visit: Payer: Self-pay | Admitting: Family

## 2021-10-14 ENCOUNTER — Encounter (INDEPENDENT_AMBULATORY_CARE_PROVIDER_SITE_OTHER): Payer: Self-pay | Admitting: Ophthalmology

## 2021-10-14 DIAGNOSIS — I10 Essential (primary) hypertension: Secondary | ICD-10-CM

## 2021-10-14 DIAGNOSIS — H34831 Tributary (branch) retinal vein occlusion, right eye, with macular edema: Secondary | ICD-10-CM | POA: Diagnosis not present

## 2021-10-14 DIAGNOSIS — H401133 Primary open-angle glaucoma, bilateral, severe stage: Secondary | ICD-10-CM | POA: Diagnosis not present

## 2021-10-14 DIAGNOSIS — H04123 Dry eye syndrome of bilateral lacrimal glands: Secondary | ICD-10-CM | POA: Diagnosis not present

## 2021-10-14 DIAGNOSIS — E113493 Type 2 diabetes mellitus with severe nonproliferative diabetic retinopathy without macular edema, bilateral: Secondary | ICD-10-CM | POA: Diagnosis not present

## 2021-10-14 MED ORDER — AMLODIPINE BESYLATE 10 MG PO TABS: ORAL_TABLET | ORAL | 1 refills | Status: AC

## 2021-10-23 NOTE — Progress Notes (Signed)
[facility] Note  10/28/2021     CHIEF COMPLAINT Patient presents for Retina Evaluation   HISTORY OF PRESENT ILLNESS: Alejandra Davis is a 85 y.o. female who presents to the clinic today for:   HPI     Retina Evaluation   In right eye.  I, the attending physician,  performed the HPI with the patient and updated documentation appropriately.        Comments   Patient here for delayed retina f/u. Re-referred by Dr. [name]. Lost to f/u here since Feb 2022. Patient states vision OD is blurry. No eye pain.       Last edited by [name], MD on 10/28/2021 11:59 AM.    Pt is delayed to follow up from 02.22.22, she states her right eye is blurry today, no new health concerns  Referring physician: Hortencia Pilar, MD [place],  [place] [id number]  HISTORICAL INFORMATION:   Selected notes from the MEDICAL RECORD NUMBER Referred by [name] for DM exam   CURRENT MEDICATIONS: Current Outpatient Medications (Ophthalmic Drugs)  Medication Sig   dorzolamide-timolol (COSOPT) 22.3-6.8 MG/ML ophthalmic solution Place 1 drop into both eyes 2 (two) times daily.   No current facility-administered medications for this visit. (Ophthalmic Drugs)   Current Outpatient Medications (Other)  Medication Sig   amLODipine (NORVASC) 10 MG tablet TAKE 1 TABLET(10 MG) BY MOUTH DAILY   ASPIRIN LOW DOSE 81 MG EC tablet Take 81 mg by mouth daily.   Lancets Ultra Thin 30G MISC 100 each by Does not apply route 3 (three) times a week.   levothyroxine (SYNTHROID) 75 MCG tablet Take 1 tablet (75 mcg total) by mouth daily.   lisinopril (ZESTRIL) 2.5 MG tablet Take 1 tablet (2.5 mg total) by mouth daily.   loratadine (CLARITIN) 10 MG tablet TAKE 1 TABLET(10 MG) BY MOUTH DAILY   melatonin 3 MG TABS tablet Take 1 tablet (3 mg total) by mouth at bedtime. May repeat x 1 dose if still unable to sleep   metoprolol tartrate (LOPRESSOR) 50 MG tablet TAKE 1  TABLET(50 MG) BY MOUTH DAILY   No current facility-administered medications for this visit. (Other)   REVIEW OF SYSTEMS: ROS   Positive for: Skin, Genitourinary, Musculoskeletal, Endocrine, Cardiovascular, Eyes Negative for: Constitutional, Gastrointestinal, Neurological, HENT, Respiratory, Psychiatric, Allergic/Imm, Heme/Lymph Last edited by [name], COA on 10/28/2021  9:26 AM.     ALLERGIES Allergies  Allergen Reactions   Donepezil Hcl     Stomach cramps    PAST MEDICAL HISTORY Past Medical History:  Diagnosis Date   Chicken pox    Cystocele    Diabetes mellitus without complication ([place])    Glaucoma    Hypertension    Hypertensive retinopathy    OU   Neuromuscular disorder ([place])    Dementia    Thyroid disease    hypothyroidism   Past Surgical History:  Procedure Laterality Date   ABDOMINAL HYSTERECTOMY  1990   TAH BSO   CATARACT EXTRACTION Bilateral    Dr. [name]   COLONOSCOPY WITH PROPOFOL N/A 04/08/2021   Procedure: COLONOSCOPY WITH PROPOFOL;  Surgeon: [name], MD;  Location: [facility];  Service: Endoscopy;  Laterality: N/A;   EYE SURGERY Bilateral    Cat Sx OU   OOPHORECTOMY     BSO   Vaginal Bx     Papilloma    FAMILY HISTORY Family History  Problem Relation Age of Onset  Sickle cell anemia Other    Hypertension Mother    Cancer Father        LIVER   Heart disease Brother    Cancer Brother        STOMACH   SOCIAL HISTORY Social History   Tobacco Use   Smoking status: Never   Smokeless tobacco: Never  Vaping Use   Vaping Use: Never used  Substance Use Topics   Alcohol use: No   Drug use: No       OPHTHALMIC EXAM: Base Eye Exam     Visual Acuity (Snellen - Linear)       Right Left   Dist [place] 20/800 20/40 -1   Dist ph [place] 20/400 +1 20/25 -1         Tonometry (Tonopen, 9:24 AM)       Right Left   Pressure 12 12         Pupils       Dark Light Shape React APD   Right 3 2 Round Brisk None   Left 3 2  Round Brisk None         Visual Fields (Counting fingers)       Left Right    Full Full         Extraocular Movement       Right Left    Full Full         Neuro/Psych     Oriented x3: Yes   Mood/Affect: Normal         Dilation     Both eyes: 1.0% Mydriacyl, 2.5% Phenylephrine @ 9:24 AM           Slit Lamp and Fundus Exam     Slit Lamp Exam       Right Left   Lids/Lashes Dermatochalasis - upper lid, Meibomian gland dysfunction Dermatochalasis - upper lid, Meibomian gland dysfunction   Conjunctiva/Sclera Melanosis Melanosis   Cornea Arcus, 1+ Punctate epithelial erosions, tear film debris, 1+ Guttata Arcus, Inferior 1+ Punctate epithelial erosions, well healed cataract wound   Anterior Chamber Deep and quiet Deep;  no cell or flare   Iris Round and dilated, No NVI Round and dilated, No NVI   Lens PC IOL in good position, 2+ Posterior capsular opacification Posterior chamber intraocular lens in good position, 1-2+ PCO.   Vitreous Vitreous syneresis Vitreous syneresis         Fundus Exam       Right Left   Disc Sharp rim, inferior notch, 2-3+ pallor, inf. Rim thinning, attenuated vessels superiorly, +disc heme at 1100, fine vascular loops superiorly 2+ pallor, Sharp rim, thin inferior rim   C/D Ratio 0.85 0.6   Macula Blunted foveal reflex, severe edema superior mac with near confluent DBH Flat, blunted foveal reflex, mild RPE mottling and clumping, Epiretinal membrane, No heme or edema   Vessels superior BRVO with severe attenuation of ST arterioles Mild vascular attenuation, AV crossing changes   Periphery Attached, DBH superior hemishphere extended from BRVO Attached, mild, scattered Reticular degeneration, No heme            Refraction     Wearing Rx       Sphere Cylinder Axis Add   Right -0.75 +1.25 170 +2.50   Left -0.75 +1.00 003 +2.50    Type: PAL         Wearing Rx #2       Sphere Cylinder Axis Add   Right -0.75 +1.25 170 +2.50  Left -0.75 +1.00 003 +2.50    Type: PAL         Manifest Refraction       Sphere Cylinder Axis Dist VA   Right -0.75 +1.25 170 20/NI   Left -1.25 +1.00 005 20/25  NI with refracting OD, could not see 20/400 on monitor.            IMAGING AND PROCEDURES  Imaging and Procedures for 03/07/18  OCT, Retina - OU - Both Eyes       Right Eye Quality was good. Central Foveal Thickness: 538. Progression has worsened. Findings include epiretinal membrane, abnormal foveal contour, intraretinal fluid, subretinal fluid (Interval re-development of superior CME).   Left Eye Quality was good. Central Foveal Thickness: 258. Progression has been stable. Findings include abnormal foveal contour, epiretinal membrane, no IRF, no SRF, vitreomacular adhesion (Blunted foveal depression--stable).   Notes *Images captured and stored on drive  Diagnosis / Impression:  ERM OU OD: BRVO w/ Interval re-development of superior CME OS: very mild ERM with blunted foveal depression -- stable; mild VMA  Clinical management:  See below  Abbreviations: NFP - Normal foveal profile. CME - cystoid macular edema. PED - pigment epithelial detachment. IRF - intraretinal fluid. SRF - subretinal fluid. EZ - ellipsoid zone. ERM - epiretinal membrane. ORA - outer retinal atrophy. ORT - outer retinal tubulation. SRHM - subretinal hyper-reflective material       Intravitreal Injection, Pharmacologic Agent - OD - Right Eye       Time Out 10/28/2021. 10:10 AM. Confirmed correct patient, procedure, site, and patient consented.   Anesthesia Topical anesthesia was used. Anesthetic medications included Lidocaine 2%, Proparacaine 0.5%.   Procedure Preparation included 5% betadine to ocular surface, eyelid speculum. A supplied needle was used.   Injection: 1.25 mg Bevacizumab 1.62m/0.05ml   Route: Intravitreal, Site: Right Eye   NDC:: 62836-629-47 Lot: [id number], Expiration date: 12/10/2021, Waste: 0 mL    Post-op Post injection exam found visual acuity of at least counting fingers. The patient tolerated the procedure well. There were no complications. The patient received written and verbal post procedure care education. Post injection medications were not given.            ASSESSMENT/PLAN:   ICD-10-CM   1. Branch retinal vein occlusion of right eye with macular edema  H34.8310 Intravitreal Injection, Pharmacologic Agent - OD - Right Eye    Bevacizumab (AVASTIN) SOLN 1.25 mg    2. Retinal edema  H35.81 OCT, Retina - OU - Both Eyes    3. Diabetes mellitus type 2 without retinopathy ([place]  E11.9     4. Epiretinal membrane (ERM) of both eyes  H35.373     5. Pseudophakia of both eyes  Z96.1     6. Ocular hypertension of right eye  H40.051     7. Glaucoma suspect of both eyes  H40.003     8. Essential hypertension  I10     9. Hypertensive retinopathy of both eyes  H35.033      1,2. BRVO with CME OD  - today, delayed to follow up from 02.22.22  - at presentation, BCVA 20/60 OD -- down from 20/30  - initial OCT w/ CME superior macula  - s/p IVA OD #1 (10.18.19), #2 (11.19.19), #3 (12.17.19), #4 (02.19.20), #5 (03.23.20), #6 (04.21.20), #7 (05.27.20), #8 (07.07.20), #9 (08.25.20), #10 (9.29.20), #11 (11.17.20), #12 (12.22.20), #13 (01.27.21), #14 (03.10.21), #15 (04.21.21), #16 (05.26.21), #17 (06.30.21), #18 (08.04.21), #19 (09.01.21), #20 (10.06.21), #21 (11.17.21), #  22 (12.30.21), #23 (2.22.22)  - good response to medications, but held IVA in January 2020 due to cataract surgery  - today, BCVA decreased to 20/400 from 20/30  - OCT shows Interval re-development of superior CME -- severe   - recommend IVA OD #24 today, 11.01.22 w f/u in 4 wks  - pt wishes to proceed  - RBA of procedure discussed, questions answered  - informed consent obtained   - [facility]ast[facility] informed consent form signed and scanned on 01.27.2021  - see procedure note  - history of interval increase in IRF/cystic  changes at 6+ weeks interval  - f/u 4 weeks -- DFE/OCT, possible injection   3. Diabetes mellitus, type 2 without retinopathy  - The incidence, risk factors for progression, natural history and treatment options for diabetic retinopathy  were discussed with patient.     - The need for close monitoring of blood glucose, blood pressure, and serum lipids, avoiding cigarette or any type of tobacco, and the need for long term follow up was also discussed with patient.   4. Epiretinal membrane, OU  - relatively mild ERM OU -- blunted central foveal depression  - OD with mild cystic changes 2/2 BRVO as above; OS without cystic changes or edema  - discussed findings and prognosis   - monitor  5. Pseudophakia OU  - s/p CE/IOL OS 11.13.19 w/ Dr. [name]  - s/p CE/IOL OD 01.16.20 w/ Dr. [name]  - beautiful surgeries -- IOLs in perfect position  - healing well post-operatively  - IRF/CME OD may have been partly due to post op CME ([place]) in addition to BRVO  6,7. Ocular hypertension / Glaucoma suspect OD>OS  - IOP ~25 OD, 20 OS at prior clinic visits  - today IOP 12 OU  - denies any family hx of glaucoma  - under the expert care of Dr. [name]  - uses Cosopt BID OU  8,9. Hypertensive retinopathy OU  - pt presented to 6.24.2020 visit urgently for right sided headache above right eye with extension to occiput  - BP at that time was 201/100 -- pt was sent to primary care clinic for urgent evaluation and meds were adjusted  - BP now under better control and headaches improved  - BP reading 11.1.22 -- 180/84 -- advised f/u with PCP  - discussed importance of tight BP control  Ophthalmic Meds Ordered this visit:  Meds ordered this encounter  Medications   Bevacizumab (AVASTIN) SOLN 1.25 mg     Return in about 4 weeks (around 11/25/2021) for f/u BRVO OD, DFE, OCT.  There are no Patient Instructions on file for this visit.  This document serves as a record of services personally performed  by Gardiner Sleeper, MD, PhD. It was created on their behalf by Estill Bakes, COT an ophthalmic technician. The creation of this record is the provider's dictation and/or activities during the visit.    Electronically signed by: Estill Bakes, [place] 10.27.22 @ 12:05 PM   This document serves as a record of services personally performed by Gardiner Sleeper, MD, PhD. It was created on their behalf by San Jetty. Owens Shark, OA an ophthalmic technician. The creation of this record is the provider's dictation and/or activities during the visit.    Electronically signed by: San Jetty. Owens Shark, [place] 11.01.2022 12:05 PM   Gardiner Sleeper, M.D., Ph.D. Diseases & Surgery of the Retina and Vitreous [facility]  I have reviewed the above documentation for accuracy and  completeness, and I agree with the above. Gardiner Sleeper, M.D., Ph.D. 10/28/21 12:05 PM   Abbreviations: M myopia (nearsighted); A astigmatism; H hyperopia (farsighted); P presbyopia; Mrx spectacle prescription;  CTL contact lenses; OD right eye; OS left eye; OU both eyes  XT exotropia; ET esotropia; PEK punctate epithelial keratitis; PEE punctate epithelial erosions; DES dry eye syndrome; MGD meibomian gland dysfunction; ATs artificial tears; PFAT's preservative free artificial tears; [place] nuclear sclerotic cataract; PSC posterior subcapsular cataract; ERM epi-retinal membrane; PVD posterior vitreous detachment; RD retinal detachment; DM diabetes mellitus; DR diabetic retinopathy; NPDR non-proliferative diabetic retinopathy; PDR proliferative diabetic retinopathy; CSME clinically significant macular edema; DME diabetic macular edema; dbh dot blot hemorrhages; CWS cotton wool spot; POAG primary open angle glaucoma; C/D cup-to-disc ratio; HVF humphrey visual field; GVF goldmann visual field; OCT optical coherence tomography; IOP intraocular pressure; BRVO Branch retinal vein occlusion; CRVO central retinal vein occlusion; CRAO central  retinal artery occlusion; BRAO branch retinal artery occlusion; RT retinal tear; SB scleral buckle; PPV pars plana vitrectomy; VH Vitreous hemorrhage; PRP panretinal laser photocoagulation; IVK intravitreal kenalog; VMT vitreomacular traction; MH Macular hole;  NVD neovascularization of the disc; NVE neovascularization elsewhere; AREDS age related eye disease study; ARMD age related macular degeneration; POAG primary open angle glaucoma; EBMD epithelial/anterior basement membrane dystrophy; ACIOL anterior chamber intraocular lens; IOL intraocular lens; PCIOL posterior chamber intraocular lens; Phaco/IOL phacoemulsification with intraocular lens placement; [place] photorefractive keratectomy; LASIK laser assisted in situ keratomileusis; HTN hypertension; DM diabetes mellitus; COPD chronic obstructive pulmonary disease

## 2021-10-28 ENCOUNTER — Encounter (INDEPENDENT_AMBULATORY_CARE_PROVIDER_SITE_OTHER): Payer: Self-pay | Admitting: Ophthalmology

## 2021-10-28 ENCOUNTER — Ambulatory Visit (INDEPENDENT_AMBULATORY_CARE_PROVIDER_SITE_OTHER): Payer: Medicare PPO | Admitting: Ophthalmology

## 2021-10-28 ENCOUNTER — Other Ambulatory Visit: Payer: Self-pay

## 2021-10-28 VITALS — BP 180/84 | HR 65

## 2021-10-28 DIAGNOSIS — Z961 Presence of intraocular lens: Secondary | ICD-10-CM

## 2021-10-28 DIAGNOSIS — H35033 Hypertensive retinopathy, bilateral: Secondary | ICD-10-CM

## 2021-10-28 DIAGNOSIS — H40003 Preglaucoma, unspecified, bilateral: Secondary | ICD-10-CM | POA: Diagnosis not present

## 2021-10-28 DIAGNOSIS — H34831 Tributary (branch) retinal vein occlusion, right eye, with macular edema: Secondary | ICD-10-CM | POA: Diagnosis not present

## 2021-10-28 DIAGNOSIS — I10 Essential (primary) hypertension: Secondary | ICD-10-CM

## 2021-10-28 DIAGNOSIS — H35373 Puckering of macula, bilateral: Secondary | ICD-10-CM

## 2021-10-28 DIAGNOSIS — H3581 Retinal edema: Secondary | ICD-10-CM

## 2021-10-28 DIAGNOSIS — E119 Type 2 diabetes mellitus without complications: Secondary | ICD-10-CM

## 2021-10-28 DIAGNOSIS — H40051 Ocular hypertension, right eye: Secondary | ICD-10-CM

## 2021-10-28 MED ORDER — BEVACIZUMAB CHEMO INJECTION 1.25MG/0.05ML SYRINGE FOR KALEIDOSCOPE
1.2500 mg | INTRAVITREAL | Status: AC | PRN
  Administered 2021-10-28: 1.25 mg via INTRAVITREAL

## 2021-11-14 NOTE — Progress Notes (Signed)
[facility] Note  11/25/2021     CHIEF COMPLAINT Patient presents for Retina Follow Up  HISTORY OF PRESENT ILLNESS: Alejandra Davis is a 85 y.o. female who presents to the clinic today for:  HPI     Retina Follow Up   Patient presents with  CRVO/BRVO.  In right eye.  Severity is moderate.  Duration of 4 weeks.  Since onset it is stable.  I, the attending physician,  performed the HPI with the patient and updated documentation appropriately.        Comments   Pt here for 4 wk ret f/u for BRVO OD. Pt states vision is about the same, no changes or issues reported.       Last edited by [name], MD on 11/25/2021 11:42 AM.      Referring physician: [name], NP [place],  [place] [address]  HISTORICAL INFORMATION:   Selected notes from the MEDICAL RECORD NUMBER Referred by [name] for DM exam   CURRENT MEDICATIONS: Current Outpatient Medications (Ophthalmic Drugs)  Medication Sig   dorzolamide-timolol (COSOPT) 22.3-6.8 MG/ML ophthalmic solution Place 1 drop into both eyes 2 (two) times daily.   No current facility-administered medications for this visit. (Ophthalmic Drugs)   Current Outpatient Medications (Other)  Medication Sig   amLODipine (NORVASC) 10 MG tablet TAKE 1 TABLET(10 MG) BY MOUTH DAILY   ASPIRIN LOW DOSE 81 MG EC tablet Take 81 mg by mouth daily.   Lancets Ultra Thin 30G MISC 100 each by Does not apply route 3 (three) times a week.   levothyroxine (SYNTHROID) 75 MCG tablet Take 1 tablet (75 mcg total) by mouth daily.   lisinopril (ZESTRIL) 2.5 MG tablet Take 1 tablet (2.5 mg total) by mouth daily.   loratadine (CLARITIN) 10 MG tablet TAKE 1 TABLET(10 MG) BY MOUTH DAILY   melatonin 3 MG TABS tablet Take 1 tablet (3 mg total) by mouth at bedtime. May repeat x 1 dose if still unable to sleep   metoprolol tartrate (LOPRESSOR) 50 MG tablet TAKE 1 TABLET(50 MG) BY MOUTH DAILY   No current  facility-administered medications for this visit. (Other)   REVIEW OF SYSTEMS: ROS   Positive for: Skin, Genitourinary, Musculoskeletal, Endocrine, Cardiovascular, Eyes Negative for: Constitutional, Gastrointestinal, Neurological, HENT, Respiratory, Psychiatric, Allergic/Imm, Heme/Lymph Last edited by Kingsley Spittle, COT on 11/25/2021  9:39 AM.      ALLERGIES Allergies  Allergen Reactions   Donepezil Hcl     Stomach cramps    PAST MEDICAL HISTORY Past Medical History:  Diagnosis Date   Chicken pox    Cystocele    Diabetes mellitus without complication ([place])    Glaucoma    Hypertension    Hypertensive retinopathy    OU   Neuromuscular disorder ([place])    Dementia    Thyroid disease    hypothyroidism   Past Surgical History:  Procedure Laterality Date   ABDOMINAL HYSTERECTOMY  1990   TAH BSO   CATARACT EXTRACTION Bilateral    Dr. [name]   COLONOSCOPY WITH PROPOFOL N/A 04/08/2021   Procedure: COLONOSCOPY WITH PROPOFOL;  Surgeon: [name], MD;  Location: [facility];  Service: Endoscopy;  Laterality: N/A;   EYE SURGERY Bilateral    Cat Sx OU   OOPHORECTOMY     BSO   Vaginal Bx     Papilloma    FAMILY HISTORY Family History  Problem Relation Age of Onset   Sickle  cell anemia Other    Hypertension Mother    Cancer Father        LIVER   Heart disease Brother    Cancer Brother        STOMACH   SOCIAL HISTORY Social History   Tobacco Use   Smoking status: Never   Smokeless tobacco: Never  Vaping Use   Vaping Use: Never used  Substance Use Topics   Alcohol use: No   Drug use: No       OPHTHALMIC EXAM: Base Eye Exam     Visual Acuity (Snellen - Linear)       Right Left   Dist [place] 20/400 20/40 +1   Dist ph  20/200 -2 20/25         Tonometry (Tonopen, 9:47 AM)       Right Left   Pressure 10 11         Pupils       Dark Light Shape React APD   Right 3 2 Round Minimal None   Left 3 2 Round Minimal None         Visual  Fields (Counting fingers)       Left Right    Full Full         Extraocular Movement       Right Left    Full, Ortho Full, Ortho         Neuro/Psych     Oriented x3: Yes   Mood/Affect: Normal         Dilation     Both eyes: 1.0% Mydriacyl, 2.5% Phenylephrine @ 9:48 AM           Slit Lamp and Fundus Exam     Slit Lamp Exam       Right Left   Lids/Lashes Dermatochalasis - upper lid, Meibomian gland dysfunction Dermatochalasis - upper lid, Meibomian gland dysfunction   Conjunctiva/Sclera Melanosis Melanosis   Cornea Arcus, 1+ Punctate epithelial erosions, tear film debris, 1+ Guttata Arcus, Inferior 1+ Punctate epithelial erosions, well healed cataract wound   Anterior Chamber Deep and quiet Deep;  no cell or flare   Iris Round and dilated, No NVI Round and dilated, No NVI   Lens PC IOL in good position, 2+ Posterior capsular opacification Posterior chamber intraocular lens in good position, 1-2+ PCO.   Anterior Vitreous Vitreous syneresis Vitreous syneresis         Fundus Exam       Right Left   Disc Sharp rim, inferior notch, 3+ pallor, inf. Rim thinning, attenuated vessels superiorly, +disc heme at 1100, fine vascular loops superiorly 2+ pallor, Sharp rim, thin inferior rim   C/D Ratio 0.85 0.6   Macula Blunted foveal reflex, severe edema superior mac--improved; significant improvement in DBH superior mac Flat, good foveal reflex, mild RPE mottling and clumping, Epiretinal membrane, No heme or edema   Vessels superior BRVO with severe attenuation of ST arterioles Mild vascular attenuation, AV crossing changes   Periphery Attached, DBH superior hemishphere extended from BRVO--improving Attached, mild, scattered Reticular degeneration, No heme             IMAGING AND PROCEDURES  Imaging and Procedures for 03/07/18  OCT, Retina - OU - Both Eyes       Right Eye Quality was good. Central Foveal Thickness: 262. Progression has improved. Findings  include epiretinal membrane, abnormal foveal contour, intraretinal fluid, no SRF, outer retinal atrophy, subretinal hyper-reflective material, intraretinal hyper-reflective material (Massive improvement in IRF/SRF  and foveal contour--just trace cystic changes remain; central SRHM and patchy ORA).   Left Eye Quality was good. Central Foveal Thickness: 261. Progression has been stable. Findings include abnormal foveal contour, epiretinal membrane, no IRF, no SRF, vitreomacular adhesion (Blunted foveal depression--stable).   Notes *Images captured and stored on drive  Diagnosis / Impression:  ERM OU OD: BRVO w/ massive improvement in IRF/SRF and foveal contour--just trace cystic changes remain; central SRHM and patchy ORA OS: very mild ERM with blunted foveal depression -- stable; mild VMA  Clinical management:  See below  Abbreviations: NFP - Normal foveal profile. CME - cystoid macular edema. PED - pigment epithelial detachment. IRF - intraretinal fluid. SRF - subretinal fluid. EZ - ellipsoid zone. ERM - epiretinal membrane. ORA - outer retinal atrophy. ORT - outer retinal tubulation. SRHM - subretinal hyper-reflective material       Intravitreal Injection, Pharmacologic Agent - OD - Right Eye       Time Out 11/25/2021. 10:29 AM. Confirmed correct patient, procedure, site, and patient consented.   Anesthesia Topical anesthesia was used. Anesthetic medications included Lidocaine 2%, Proparacaine 0.5%.   Procedure Preparation included 5% betadine to ocular surface, eyelid speculum. A supplied needle was used.   Injection: 1.25 mg Bevacizumab 1.53m/0.05ml   Route: Intravitreal, Site: Right Eye   NDC:: 77824-235-36 Lot: [id number] , Expiration date: 01/06/2022, Waste: 0 mL   Post-op Post injection exam found visual acuity of at least counting fingers. The patient tolerated the procedure well. There were no complications. The patient received written and verbal post procedure care  education. Post injection medications were not given.             ASSESSMENT/PLAN:   ICD-10-CM   1. Branch retinal vein occlusion of right eye with macular edema  H34.8310 Intravitreal Injection, Pharmacologic Agent - OD - Right Eye    Bevacizumab (AVASTIN) SOLN 1.25 mg    2. Retinal edema  H35.81 OCT, Retina - OU - Both Eyes    3. Diabetes mellitus type 2 without retinopathy ([place]  E11.9     4. Epiretinal membrane (ERM) of both eyes  H35.373     5. Pseudophakia of both eyes  Z96.1     6. Ocular hypertension of right eye  H40.051     7. Glaucoma suspect of both eyes  H40.003     8. Essential hypertension  I10     9. Hypertensive retinopathy of both eyes  H35.033      1,2. BRVO with CME OD  - lost to follow up from 02.22.22 to 11.1.22 -- re-presented with massive CME and BCVA 20/400 from 20/30  - initial OCT w/ CME superior macula  - s/p IVA OD #1 (10.18.19), #2 (11.19.19), #3 (12.17.19), #4 (02.19.20), #5 (03.23.20), #6 (04.21.20), #7 (05.27.20), #8 (07.07.20), #9 (08.25.20), #10 (9.29.20), #11 (11.17.20), #12 (12.22.20), #13 (01.27.21), #14 (03.10.21), #15 (04.21.21), #16 (05.26.21), #17 (06.30.21), #18 (08.04.21), #19 (09.01.21), #20 (10.06.21), #21 (11.17.21), #22 (12.30.21), #23 (2.22.22), #24 (11.1.22)  - good response to medications, but held IVA in January 2020 due to cataract surgery  - today, BCVA improved to 20/200 from 20/400 -- improvement limited by central atrophy  - OCT shows massive improvement in IRF/SRF and foveal contour--just trace cystic changes remain; central SRHM and patchy ORA   - recommend IVA OD #25 today, 11.29.22 w f/u in 4-5 wks  - pt wishes to proceed  - RBA of procedure discussed, questions answered  - informed consent obtained   - Avastin  informed consent form signed and scanned on 01.27.2021  - see procedure note  - history of interval increase in IRF/cystic changes at 6+ weeks interval  - f/u 4-5 weeks -- DFE/OCT, possible injection    3. Diabetes mellitus, type 2 without retinopathy  - The incidence, risk factors for progression, natural history and treatment options for diabetic retinopathy  were discussed with patient.     - The need for close monitoring of blood glucose, blood pressure, and serum lipids, avoiding cigarette or any type of tobacco, and the need for long term follow up was also discussed with patient.   4. Epiretinal membrane, OU  - relatively mild ERM OU -- blunted central foveal depression  - OD with mild cystic changes 2/2 BRVO as above; OS without cystic changes or edema  - discussed findings and prognosis   - monitor  5. Pseudophakia OU  - s/p CE/IOL OS 11.13.19 w/ Dr. [name]  - s/p CE/IOL OD 01.16.20 w/ Dr. [name]  - beautiful surgeries -- IOLs in perfect position  - healing well post-operatively  - IRF/CME OD may have been partly due to post op CME ([place]) in addition to BRVO  6,7. Ocular hypertension / Glaucoma suspect OD>OS  - IOP ~25 OD, 20 OS at prior clinic visits  - today IOP 10,11  - denies any family hx of glaucoma  - under the expert care of Dr. [name]  - uses Cosopt BID OU  8,9. Hypertensive retinopathy OU  - pt presented to 6.24.2020 visit urgently for right sided headache above right eye with extension to occiput  - BP at that time was 201/100 -- pt was sent to primary care clinic for urgent evaluation and meds were adjusted  - BP now under better control and headaches improved  - BP reading 11.1.22 -- 180/84 -- advised f/u with PCP  - discussed importance of tight BP control  Ophthalmic Meds Ordered this visit:  Meds ordered this encounter  Medications   Bevacizumab (AVASTIN) SOLN 1.25 mg      Return for 4-5 wk f/u for BRVO w/CME OD w/DFE/OCT/likely inj. OD.  There are no Patient Instructions on file for this visit.  This document serves as a record of services personally performed by Gardiner Sleeper, MD, PhD. It was created on their behalf by Estill Bakes,  COT an ophthalmic technician. The creation of this record is the provider's dictation and/or activities during the visit.    Electronically signed by: Estill Bakes, COT 11.18.22 @ 11:46 AM   This document serves as a record of services personally performed by Gardiner Sleeper, MD, PhD. It was created on their behalf by Estill Bakes, COT an ophthalmic technician. The creation of this record is the provider's dictation and/or activities during the visit.    Electronically signed by: Estill Bakes, [place] 11.29.22 @ 11:46 AM   Gardiner Sleeper, M.D., Ph.D. Diseases & Surgery of the Retina and [place] 11.29.22  I have reviewed the above documentation for accuracy and completeness, and I agree with the above. Gardiner Sleeper, M.D., Ph.D. 11/25/21 11:46 AM  Abbreviations: M myopia (nearsighted); A astigmatism; H hyperopia (farsighted); P presbyopia; Mrx spectacle prescription;  CTL contact lenses; OD right eye; OS left eye; OU both eyes  XT exotropia; ET esotropia; PEK punctate epithelial keratitis; PEE punctate epithelial erosions; DES dry eye syndrome; MGD meibomian gland dysfunction; ATs artificial tears; PFAT's preservative free artificial tears; [place] nuclear sclerotic cataract; PSC posterior  subcapsular cataract; ERM epi-retinal membrane; PVD posterior vitreous detachment; RD retinal detachment; DM diabetes mellitus; DR diabetic retinopathy; NPDR non-proliferative diabetic retinopathy; PDR proliferative diabetic retinopathy; CSME clinically significant macular edema; DME diabetic macular edema; dbh dot blot hemorrhages; CWS cotton wool spot; POAG primary open angle glaucoma; C/D cup-to-disc ratio; HVF humphrey visual field; GVF goldmann visual field; OCT optical coherence tomography; IOP intraocular pressure; BRVO Branch retinal vein occlusion; CRVO central retinal vein occlusion; CRAO central retinal artery occlusion; BRAO branch retinal artery occlusion; RT retinal  tear; SB scleral buckle; PPV pars plana vitrectomy; VH Vitreous hemorrhage; PRP panretinal laser photocoagulation; IVK intravitreal kenalog; VMT vitreomacular traction; MH Macular hole;  NVD neovascularization of the disc; NVE neovascularization elsewhere; AREDS age related eye disease study; ARMD age related macular degeneration; POAG primary open angle glaucoma; EBMD epithelial/anterior basement membrane dystrophy; ACIOL anterior chamber intraocular lens; IOL intraocular lens; PCIOL posterior chamber intraocular lens; Phaco/IOL phacoemulsification with intraocular lens placement; [place] photorefractive keratectomy; LASIK laser assisted in situ keratomileusis; HTN hypertension; DM diabetes mellitus; COPD chronic obstructive pulmonary disease

## 2021-11-18 ENCOUNTER — Other Ambulatory Visit: Payer: Self-pay

## 2021-11-18 DIAGNOSIS — I700 Atherosclerosis of aorta: Secondary | ICD-10-CM | POA: Diagnosis not present

## 2021-11-18 DIAGNOSIS — Z0000 Encounter for general adult medical examination without abnormal findings: Secondary | ICD-10-CM | POA: Diagnosis not present

## 2021-11-18 DIAGNOSIS — J3089 Other allergic rhinitis: Secondary | ICD-10-CM | POA: Diagnosis not present

## 2021-11-18 DIAGNOSIS — R21 Rash and other nonspecific skin eruption: Secondary | ICD-10-CM | POA: Diagnosis not present

## 2021-11-18 DIAGNOSIS — Z23 Encounter for immunization: Secondary | ICD-10-CM | POA: Diagnosis not present

## 2021-11-18 DIAGNOSIS — R413 Other amnesia: Secondary | ICD-10-CM | POA: Diagnosis not present

## 2021-11-18 DIAGNOSIS — I10 Essential (primary) hypertension: Secondary | ICD-10-CM | POA: Diagnosis not present

## 2021-11-18 DIAGNOSIS — E1169 Type 2 diabetes mellitus with other specified complication: Secondary | ICD-10-CM | POA: Diagnosis not present

## 2021-11-18 DIAGNOSIS — E039 Hypothyroidism, unspecified: Secondary | ICD-10-CM | POA: Diagnosis not present

## 2021-11-18 DIAGNOSIS — I719 Aortic aneurysm of unspecified site, without rupture: Secondary | ICD-10-CM

## 2021-11-24 ENCOUNTER — Encounter (INDEPENDENT_AMBULATORY_CARE_PROVIDER_SITE_OTHER): Payer: Self-pay | Admitting: Ophthalmology

## 2021-11-24 ENCOUNTER — Encounter: Payer: Self-pay | Admitting: Podiatry

## 2021-11-25 ENCOUNTER — Ambulatory Visit (INDEPENDENT_AMBULATORY_CARE_PROVIDER_SITE_OTHER): Payer: Medicare PPO | Admitting: Ophthalmology

## 2021-11-25 ENCOUNTER — Encounter (INDEPENDENT_AMBULATORY_CARE_PROVIDER_SITE_OTHER): Payer: Self-pay | Admitting: Ophthalmology

## 2021-11-25 ENCOUNTER — Other Ambulatory Visit: Payer: Self-pay

## 2021-11-25 DIAGNOSIS — I10 Essential (primary) hypertension: Secondary | ICD-10-CM

## 2021-11-25 DIAGNOSIS — H40051 Ocular hypertension, right eye: Secondary | ICD-10-CM

## 2021-11-25 DIAGNOSIS — H40003 Preglaucoma, unspecified, bilateral: Secondary | ICD-10-CM

## 2021-11-25 DIAGNOSIS — H34831 Tributary (branch) retinal vein occlusion, right eye, with macular edema: Secondary | ICD-10-CM | POA: Diagnosis not present

## 2021-11-25 DIAGNOSIS — Z961 Presence of intraocular lens: Secondary | ICD-10-CM

## 2021-11-25 DIAGNOSIS — H35033 Hypertensive retinopathy, bilateral: Secondary | ICD-10-CM

## 2021-11-25 DIAGNOSIS — E119 Type 2 diabetes mellitus without complications: Secondary | ICD-10-CM

## 2021-11-25 DIAGNOSIS — H35373 Puckering of macula, bilateral: Secondary | ICD-10-CM | POA: Diagnosis not present

## 2021-11-25 DIAGNOSIS — H3581 Retinal edema: Secondary | ICD-10-CM

## 2021-11-25 MED ORDER — BEVACIZUMAB CHEMO INJECTION 1.25MG/0.05ML SYRINGE FOR KALEIDOSCOPE
1.2500 mg | INTRAVITREAL | Status: AC | PRN
  Administered 2021-11-25: 1.25 mg via INTRAVITREAL

## 2021-12-01 ENCOUNTER — Ambulatory Visit
Admission: RE | Admit: 2021-12-01 | Discharge: 2021-12-01 | Disposition: A | Discharge status: Home / Self Care | Payer: Medicare PPO | Source: Ambulatory Visit | Attending: Emergency Medicine | Admitting: Emergency Medicine

## 2021-12-01 ENCOUNTER — Other Ambulatory Visit: Payer: Self-pay

## 2021-12-01 VITALS — BP 167/84 | HR 68 | Temp 98.4°F | Resp 16

## 2021-12-01 DIAGNOSIS — E1159 Type 2 diabetes mellitus with other circulatory complications: Secondary | ICD-10-CM | POA: Diagnosis not present

## 2021-12-01 DIAGNOSIS — E11621 Type 2 diabetes mellitus with foot ulcer: Secondary | ICD-10-CM

## 2021-12-01 DIAGNOSIS — I872 Venous insufficiency (chronic) (peripheral): Secondary | ICD-10-CM

## 2021-12-01 DIAGNOSIS — E08621 Diabetes mellitus due to underlying condition with foot ulcer: Secondary | ICD-10-CM

## 2021-12-01 DIAGNOSIS — L97501 Non-pressure chronic ulcer of other part of unspecified foot limited to breakdown of skin: Secondary | ICD-10-CM | POA: Diagnosis not present

## 2021-12-01 NOTE — ED Provider Notes (Signed)
[facility]    CSN: [id number] Arrival date & time: 12/01/21  1115    HISTORY  No chief complaint on file.  HPI Alejandra Davis is a 85 y.o. female. Patients aid states that patient is a diabetic and has blisters to both feet (lateral)  The history is provided by the patient.  Past Medical History:  Diagnosis Date   Chicken pox    Cystocele    Diabetes mellitus without complication ([place])    Glaucoma    Hypertension    Hypertensive retinopathy    OU   Neuromuscular disorder ([place])    Dementia    Thyroid disease    hypothyroidism   Patient Active Problem List   Diagnosis Date Noted   Rectal bleeding 04/10/2021   Chronic kidney disease, stage 3a ([place]) 04/06/2021   Late onset Alzheimer's dementia without behavioral disturbance ([place]) 04/06/2021   Sinus tachycardia 04/06/2021   Lower GI bleed 04/06/2021   Acute lower GI bleeding 04/05/2021   Stage 3b chronic kidney disease ([place]) 09/17/2020   Hyperlipidemia associated with type 2 diabetes mellitus ([place]) 09/17/2020   Flexural atopic dermatitis 05/30/2019   Presbycusis of both ears 05/11/2019   Acquired hypothyroidism 03/13/2019   Murmur 09/01/2018   Telogen effluvium 06/07/2018   Eczema of scalp 03/31/2018   Xerosis cutis 02/25/2018   Immunization reaction 02/08/2018   Keratosis pilaris 02/08/2018   Health care maintenance 02/07/2018   Controlled type 2 diabetes mellitus without complication, without long-term current use of insulin ([place]) 02/07/2018   Cystocele    Osteopenia    Essential hypertension    Past Surgical History:  Procedure Laterality Date   ABDOMINAL HYSTERECTOMY  1990   TAH BSO   CATARACT EXTRACTION Bilateral    Dr. [name]   COLONOSCOPY WITH PROPOFOL N/A 04/08/2021   Procedure: COLONOSCOPY WITH PROPOFOL;  Surgeon: [name], MD;  Location: [facility];  Service: Endoscopy;  Laterality: N/A;   EYE SURGERY Bilateral    Cat Sx OU   OOPHORECTOMY     BSO   Vaginal Bx     Papilloma    OB History     Gravida  3   Para  2   Term  2   Preterm      AB  1   Living  2      SAB  1   IAB      Ectopic      Multiple      Live Births             Home Medications    Prior to Admission medications   Medication Sig Start Date End Date Taking? Authorizing Provider  amLODipine (NORVASC) 10 MG tablet TAKE 1 TABLET(10 MG) BY MOUTH DAILY 10/14/21   Ngetich, Dinah C, NP  ASPIRIN LOW DOSE 81 MG EC tablet Take 81 mg by mouth daily. 07/13/21   [provider]  dorzolamide-timolol (COSOPT) 22.3-6.8 MG/ML ophthalmic solution Place 1 drop into both eyes 2 (two) times daily. 10/15/20   [provider]  Lancets Ultra Thin 30G MISC 100 each by Does not apply route 3 (three) times a week. 09/18/20   Ngetich, Dinah C, NP  levothyroxine (SYNTHROID) 75 MCG tablet Take 1 tablet (75 mcg total) by mouth daily. 10/06/21   Ngetich, Dinah C, NP  lisinopril (ZESTRIL) 2.5 MG tablet Take 1 tablet (2.5 mg total) by mouth daily. 10/06/21   Ngetich, Dinah C, NP  loratadine (CLARITIN) 10 MG tablet TAKE 1 TABLET(10  MG) BY MOUTH DAILY 10/06/21   Ngetich, Dinah C, NP  melatonin 3 MG TABS tablet Take 1 tablet (3 mg total) by mouth at bedtime. May repeat x 1 dose if still unable to sleep 10/06/21   Ngetich, Dinah C, NP  metoprolol tartrate (LOPRESSOR) 50 MG tablet TAKE 1 TABLET(50 MG) BY MOUTH DAILY 10/06/21   Ngetich, Dinah C, NP   Family History Family History  Problem Relation Age of Onset   Sickle cell anemia Other    Hypertension Mother    Cancer Father        LIVER   Heart disease Brother    Cancer Brother        STOMACH   Social History Social History   Tobacco Use   Smoking status: Never   Smokeless tobacco: Never  Vaping Use   Vaping Use: Never used  Substance Use Topics   Alcohol use: No   Drug use: No   Allergies   Donepezil hcl  Review of Systems Review of Systems Pertinent findings noted in history of present illness.   Physical Exam Triage  Vital Signs ED Triage Vitals  Enc Vitals Group     BP 10/24/21 0827 (!) 147/82     Pulse Rate 10/24/21 0827 72     Resp 10/24/21 0827 18     Temp 10/24/21 0827 98.3 F (36.8 C)     Temp Source 10/24/21 0827 Oral     SpO2 10/24/21 0827 98 %     Weight --      Height --      Head Circumference --      Peak Flow --      Pain Score 10/24/21 0826 5     Pain Loc --      Pain Edu? --      Excl. in [place]? --   No data found.  Updated Vital Signs BP (!) 167/84 (BP Location: Right Arm)   Pulse 68   Temp 98.4 F (36.9 C) (Oral)   Resp 16   SpO2 98%   Physical Exam Vitals and nursing note reviewed.  Constitutional:      General: She is not in acute distress.    Appearance: Normal appearance. She is not ill-appearing.  HENT:     Head: Normocephalic and atraumatic.  Eyes:     General: Lids are normal.        Right eye: No discharge.        Left eye: No discharge.     Extraocular Movements: Extraocular movements intact.     Conjunctiva/sclera: Conjunctivae normal.     Right eye: Right conjunctiva is not injected.     Left eye: Left conjunctiva is not injected.  Neck:     Trachea: Trachea and phonation normal.  Cardiovascular:     Rate and Rhythm: Normal rate and regular rhythm.     Pulses: Normal pulses.     Heart sounds: Normal heart sounds. No murmur heard.   No friction rub. No gallop.  Pulmonary:     Effort: Pulmonary effort is normal. No accessory muscle usage, prolonged expiration or respiratory distress.     Breath sounds: Normal breath sounds. No stridor, decreased air movement or transmitted upper airway sounds. No decreased breath sounds, wheezing, rhonchi or rales.  Chest:     Chest wall: No tenderness.  Musculoskeletal:        General: Normal range of motion.     Cervical back: Normal range of motion and neck  supple. Normal range of motion.  Lymphadenopathy:     Cervical: No cervical adenopathy.  Skin:    General: Skin is warm and dry.     Findings: Lesion  (Multiple fluid-filled blisters located at both heels just above soles of feet, there is also one on the anterior left lower extremity just above ankle.  No surrounding erythema or tenderness appreciated on exam.) present. No erythema or rash.     Comments: Venous stasis dermatitis seen on the lateral aspect of both lower extremities no lower extremity edema appreciated.  Neurological:     General: No focal deficit present.     Mental Status: She is alert and oriented to person, place, and time.  Psychiatric:        Mood and Affect: Mood normal.        Behavior: Behavior normal.    Visual Acuity Right Eye Distance:   Left Eye Distance:   Bilateral Distance:    Right Eye Near:   Left Eye Near:    Bilateral Near:     UC Couse / Diagnostics / Procedures:    EKG  Radiology No results found.  Procedures Procedures (including critical care time)  UC Diagnoses / Final Clinical Impressions(s)   I have reviewed the triage vital signs and the nursing notes.  Pertinent labs & imaging results that were available during my care of the patient were reviewed by me and considered in my medical decision making (see chart for details).    Final diagnoses:  Diabetic ulcer of foot associated with diabetes mellitus due to underlying condition, limited to breakdown of skin, unspecified laterality, unspecified part of foot ([place])  Venous stasis dermatitis of both lower extremities   All blisters currently intact, no need for bandaging at this time.  Patient referred to [facility] wound care and hyper[facility]ic center for further evaluation and treatment.  Patient also advised to follow-up with her primary care provider, requests referral to vascular for evaluation of poor venous circulation.  ED Prescriptions   None    PDMP not reviewed this encounter.  Pending results:  Labs Reviewed - No data to display  Medications Ordered in UC: Medications - No data to display  Disposition Upon  Discharge:  Condition: stable for discharge home Home: take medications as prescribed; routine discharge instructions as discussed; follow up as advised.  Patient presented with an acute illness with associated systemic symptoms and significant discomfort requiring urgent management. In my opinion, this is a condition that a prudent lay person (someone who possesses an average knowledge of health and medicine) may potentially expect to result in complications if not addressed urgently such as respiratory distress, impairment of bodily function or dysfunction of bodily organs.   Routine symptom specific, illness specific and/or disease specific instructions were discussed with the patient and/or caregiver at length.   As such, the patient has been evaluated and assessed, work-up was performed and treatment was provided in alignment with urgent care protocols and evidence based medicine.  Patient/parent/caregiver has been advised that the patient may require follow up for further testing and treatment if the symptoms continue in spite of treatment, as clinically indicated and appropriate.  If the patient was tested for COVID-19, Influenza and/or RSV, then the patient/parent/guardian was advised to isolate at home pending the results of his/her diagnostic coronavirus test and potentially longer if they're positive. I have also advised pt that if his/her COVID-19 test returns positive, it's recommended to self-isolate for at least 10 days after  symptoms first appeared AND until fever-free for 24 hours without fever reducer AND other symptoms have improved or resolved. Discussed self-isolation recommendations as well as instructions for household member/close contacts as per the [facility] [place] DHHS, and also gave patient the [place] packet with this information.  Patient/parent/caregiver has been advised to return to the [facility] [employer] or PCP in 3-5 days if no better; to PCP or the Emergency Department if new signs and  symptoms develop, or if the current signs or symptoms continue to change or worsen for further workup, evaluation and treatment as clinically indicated and appropriate  The patient will follow up with their current PCP if and as advised. If the patient does not currently have a PCP we will assist them in obtaining one.   The patient may need specialty follow up if the symptoms continue, in spite of conservative treatment and management, for further workup, evaluation, consultation and treatment as clinically indicated and appropriate.   Patient/parent/caregiver verbalized understanding and agreement of plan as discussed.  All questions were addressed during visit.  Please see discharge instructions below for further details of plan.  Discharge Instructions:   Discharge Instructions      Please follow-up with your primary care physician regarding the dry skin on both lower legs also known as venous stasis dermatitis.  This is related to her longstanding type 2 diabetes despite it being well controlled.  This is a natural progression of type 2 diabetes.  It is likely that she may benefit from a referral to vascular to evaluate her venous circulation in her lower extremities.  Because she has poor circulation in her lower extremities, the fluid that would usually be returned to the heart and the venous system is unable to get there efficiently and instead is being pocketed in the layers of the skin in her feet.  I have initiated a referral to [facility] health wound care.  You should hear from them within the next day or 2.  You can also reach out to them today if you like, if you would like to get something scheduled sooner.  [address], [address],  [place]  [phone]  Main: [place], [place], PA-C 12/01/21 1210

## 2021-12-01 NOTE — ED Triage Notes (Signed)
Patients aid states that patient is a diabetic and has blisters to both feet (lateral)

## 2021-12-01 NOTE — Discharge Instructions (Addendum)
Please follow-up with your primary care physician regarding the dry skin on both lower legs also known as venous stasis dermatitis.  This is related to her longstanding type 2 diabetes despite it being well controlled.  This is a natural progression of type 2 diabetes.  It is likely that she may benefit from a referral to vascular to evaluate her venous circulation in her lower extremities.  Because she has poor circulation in her lower extremities, the fluid that would usually be returned to the heart and the venous system is unable to get there efficiently and instead is being pocketed in the layers of the skin in her feet.  I have initiated a referral to [facility] health wound care.  You should hear from them within the next day or 2.  You can also reach out to them today if you like, if you would like to get something scheduled sooner.  [place] [address]  [address], [address],  [place]  [phone]  Main: [phone]

## 2021-12-05 DIAGNOSIS — Z681 Body mass index (BMI) 19 or less, adult: Secondary | ICD-10-CM | POA: Diagnosis not present

## 2021-12-05 DIAGNOSIS — E1169 Type 2 diabetes mellitus with other specified complication: Secondary | ICD-10-CM | POA: Diagnosis not present

## 2021-12-05 DIAGNOSIS — L03116 Cellulitis of left lower limb: Secondary | ICD-10-CM | POA: Diagnosis not present

## 2021-12-09 NOTE — Telephone Encounter (Signed)
Please advise 

## 2021-12-10 ENCOUNTER — Other Ambulatory Visit: Payer: Self-pay

## 2021-12-10 ENCOUNTER — Ambulatory Visit: Payer: Medicare PPO | Admitting: Podiatry

## 2021-12-10 DIAGNOSIS — S80822A Blister (nonthermal), left lower leg, initial encounter: Secondary | ICD-10-CM | POA: Diagnosis not present

## 2021-12-10 DIAGNOSIS — E119 Type 2 diabetes mellitus without complications: Secondary | ICD-10-CM | POA: Diagnosis not present

## 2021-12-11 NOTE — Progress Notes (Signed)
Subjective:  Patient ID: Alejandra Davis, female    DOB: 01-09-1936,  MRN: [id number]  Chief Complaint  Patient presents with   Diabetes    Bilateral blisters     85 y.o. female presents with the above complaint.  Patient states that she is got a new blister formation on the left anterior leg.  She does not recall how it happened.  She denies any frictional injury.  She denies any other injury.  She states nothing rubs on it.  Her right lateral foot is doing much better from when I seen her in the hospital.  She denies any other acute complaints.  She would like to discuss treatment options for this.  She would like to have the blister drained.   Review of Systems: Negative except as noted in the HPI. Denies N/V/F/Ch.  Past Medical History:  Diagnosis Date   Chicken pox    Cystocele    Diabetes mellitus without complication (HCC)    Glaucoma    Hypertension    Hypertensive retinopathy    OU   Neuromuscular disorder ([place])    Dementia    Thyroid disease    hypothyroidism    Current Outpatient Medications:    amLODipine (NORVASC) 10 MG tablet, TAKE 1 TABLET(10 MG) BY MOUTH DAILY, Disp: 90 tablet, Rfl: 1   ASPIRIN LOW DOSE 81 MG EC tablet, Take 81 mg by mouth daily., Disp: , Rfl:    dorzolamide-timolol (COSOPT) 22.3-6.8 MG/ML ophthalmic solution, Place 1 drop into both eyes 2 (two) times daily., Disp: , Rfl:    Lancets Ultra Thin 30G MISC, 100 each by Does not apply route 3 (three) times a week., Disp: 100 each, Rfl: 0   levothyroxine (SYNTHROID) 75 MCG tablet, Take 1 tablet (75 mcg total) by mouth daily., Disp: 90 tablet, Rfl: 1   lisinopril (ZESTRIL) 2.5 MG tablet, Take 1 tablet (2.5 mg total) by mouth daily., Disp: 90 tablet, Rfl: 1   loratadine (CLARITIN) 10 MG tablet, TAKE 1 TABLET(10 MG) BY MOUTH DAILY, Disp: 90 tablet, Rfl: 1   melatonin 3 MG TABS tablet, Take 1 tablet (3 mg total) by mouth at bedtime. May repeat x 1 dose if still unable to sleep, Disp: 90 tablet, Rfl: 1    metoprolol tartrate (LOPRESSOR) 50 MG tablet, TAKE 1 TABLET(50 MG) BY MOUTH DAILY, Disp: 90 tablet, Rfl: 1  Social History   Tobacco Use  Smoking Status Never  Smokeless Tobacco Never    Allergies  Allergen Reactions   Donepezil Hcl     Stomach cramps    Objective:  There were no vitals filed for this visit. There is no height or weight on file to calculate BMI. Constitutional Well developed. Well nourished.  Vascular Dorsalis pedis pulses palpable bilaterally. Posterior tibial pulses palpable bilaterally. Capillary refill normal to all digits.  No cyanosis or clubbing noted. Pedal hair growth normal.  Neurologic Normal speech. Oriented to person, place, and time. Epicritic sensation to light touch grossly present bilaterally.  Dermatologic Friction blister noted to left anterior shin/leg.  Mild pain on palpation.  No signs of fluctuance or crepitus noted.  No purulent drainage noted.  No signs of infection noted.  No swelling noted.  Orthopedic: Normal joint ROM without pain or crepitus bilaterally. No visible deformities. No bony tenderness.   Radiographs: None Assessment:   1. Blister of left lower extremity, initial encounter   2. Type 2 diabetes mellitus without complication, without long-term current use of insulin ([place])  Plan:  Patient was evaluated and treated and all questions answered.  Left anterior leg blister without underlying etiology -I explained to the patient the etiology of blister and various treatment options were discussed.  At this time I believe patient will benefit from lancing of blister and releasing the fluid.  No clinical signs of infection noted. -Using an 18-gauge needle the blister was drained.  Serosanguineous fluid was noted.  No purulent drainage noted.  The site was covered with Xeroform 4 x 4 gauze Kerlix and Ace bandage.  I discussed with him to keep it covered for next few days.  They can transition to triple antibiotic and a  Band-Aid. -At this time I discussed with patient shoe gear modification as well as continue clinical monitor any areas of friction causing things.  She states understanding  No follow-ups on file.

## 2021-12-15 ENCOUNTER — Other Ambulatory Visit: Payer: Self-pay

## 2021-12-15 ENCOUNTER — Ambulatory Visit (HOSPITAL_COMMUNITY)
Admission: RE | Admit: 2021-12-15 | Discharge: 2021-12-15 | Disposition: A | Discharge status: Home / Self Care | Payer: Medicare PPO | Source: Ambulatory Visit | Attending: Infectious Disease | Admitting: Infectious Disease

## 2021-12-15 ENCOUNTER — Encounter (HOSPITAL_COMMUNITY): Payer: Self-pay

## 2021-12-15 DIAGNOSIS — I719 Aortic aneurysm of unspecified site, without rupture: Secondary | ICD-10-CM | POA: Insufficient documentation

## 2021-12-15 DIAGNOSIS — N281 Cyst of kidney, acquired: Secondary | ICD-10-CM | POA: Diagnosis not present

## 2021-12-15 LAB — POCT I-STAT CREATININE: Creatinine, Ser: 1.6 mg/dL — ABNORMAL HIGH (ref 0.44–1.00)

## 2021-12-15 MED ORDER — IOHEXOL 350 MG/ML SOLN
75.0000 mL | Freq: Once | INTRAVENOUS | Status: AC | PRN
  Administered 2021-12-15: 12:00:00 75 mL via INTRAVENOUS

## 2021-12-15 MED ORDER — SODIUM CHLORIDE (PF) 0.9 % IJ SOLN
INTRAMUSCULAR | Status: AC
  Filled 2021-12-15: qty 50

## 2021-12-18 ENCOUNTER — Encounter: Payer: Self-pay | Admitting: Vascular Surgery

## 2021-12-18 ENCOUNTER — Ambulatory Visit: Payer: Medicare PPO | Admitting: Vascular Surgery

## 2021-12-18 ENCOUNTER — Other Ambulatory Visit: Payer: Self-pay

## 2021-12-18 VITALS — BP 153/84 | HR 73 | Temp 97.9°F | Resp 20 | Ht 63.0 in | Wt 115.0 lb

## 2021-12-18 DIAGNOSIS — I719 Aortic aneurysm of unspecified site, without rupture: Secondary | ICD-10-CM

## 2021-12-18 DIAGNOSIS — I71012 Dissection of descending thoracic aorta: Secondary | ICD-10-CM | POA: Diagnosis not present

## 2021-12-18 NOTE — Progress Notes (Signed)
REASON FOR VISIT:   Follow-up of penetrating ulcer of descending thoracic aorta.  MEDICAL ISSUES:   FOCAL DISSECTION OF THE DESCENDING THORACIC AORTA: CT scan shows that the ulcer has progressed slightly.  She now has a focal dissection of the descending thoracic aorta but no evidence of aneurysmal degeneration and no significant narrowing.  When I review her films from September 2020 I do not think there is been a significant change compared to her most recent study.  She has palpable pedal pulses.  She is not a smoker.  Her blood pressures been under good control.  I think we can stretch her follow-up out in 9 months.  Have ordered a CT angio of the chest abdomen and pelvis in 9 months and I will see her back at that time.  She knows to call sooner if she has problems.  I have discussed the patient's findings with her family both here in the office and on the phone.   HPI:   JAKEYA Davis is a pleasant 85 y.o. female who I last saw on 05/21/2021.  She had presented with a GI bleed which prompted a CT scan of the abdomen and pelvis which showed a penetrating ulcer of the descending thoracic aorta near the GE junction.  She subsequently had a CT of the chest to complete her work-up.  She came in for a follow-up visit on 05/21/2021.  Of note she has a history of Alzheimer's dementia and a poor understanding of what was going on.  However I explained his best I could that I would only consider addressing this issue if there was significant change or it became symptomatic.  We would consider placing a thoracic stent graft if she became symptomatic.  I will order a follow-up CT chest abdomen pelvis in 6 months and he comes in for that visit.  Since I saw her last, she denies any abdominal pain or back pain.  She denies any chest pain or shortness of breath.  I do not get any history of claudication or rest pain.  Past Medical History:  Diagnosis Date   Chicken pox    Cystocele    Diabetes mellitus  without complication (HCC)    Glaucoma    Hypertension    Hypertensive retinopathy    OU   Neuromuscular disorder ([place])    Dementia    Thyroid disease    hypothyroidism    Family History  Problem Relation Age of Onset   Sickle cell anemia Other    Hypertension Mother    Cancer Father        LIVER   Heart disease Brother    Cancer Brother        STOMACH    SOCIAL HISTORY: Social History   Tobacco Use   Smoking status: Never   Smokeless tobacco: Never  Substance Use Topics   Alcohol use: No    Allergies  Allergen Reactions   Donepezil Hcl     Stomach cramps     Current Outpatient Medications  Medication Sig Dispense Refill   amLODipine (NORVASC) 10 MG tablet TAKE 1 TABLET(10 MG) BY MOUTH DAILY 90 tablet 1   ASPIRIN LOW DOSE 81 MG EC tablet Take 81 mg by mouth daily.     dorzolamide-timolol (COSOPT) 22.3-6.8 MG/ML ophthalmic solution Place 1 drop into both eyes 2 (two) times daily.     Lancets Ultra Thin 30G MISC 100 each by Does not apply route 3 (three) times a week.  100 each 0   levothyroxine (SYNTHROID) 75 MCG tablet Take 1 tablet (75 mcg total) by mouth daily. 90 tablet 1   lisinopril (ZESTRIL) 2.5 MG tablet Take 1 tablet (2.5 mg total) by mouth daily. 90 tablet 1   loratadine (CLARITIN) 10 MG tablet TAKE 1 TABLET(10 MG) BY MOUTH DAILY 90 tablet 1   melatonin 3 MG TABS tablet Take 1 tablet (3 mg total) by mouth at bedtime. May repeat x 1 dose if still unable to sleep 90 tablet 1   metoprolol tartrate (LOPRESSOR) 50 MG tablet TAKE 1 TABLET(50 MG) BY MOUTH DAILY 90 tablet 1   No current facility-administered medications for this visit.    REVIEW OF SYSTEMS:  [X]  denotes positive finding, [ ]  denotes negative finding Cardiac  Comments:  Chest pain or chest pressure:    Shortness of breath upon exertion:    Short of breath when lying flat:    Irregular heart rhythm:        Vascular    Pain in calf, thigh, or hip brought on by ambulation:    Pain in feet at  night that wakes you up from your sleep:     Blood clot in your veins:    Leg swelling:         Pulmonary    Oxygen at home:    Productive cough:     Wheezing:         Neurologic    Sudden weakness in arms or legs:     Sudden numbness in arms or legs:     Sudden onset of difficulty speaking or slurred speech:    Temporary loss of vision in one eye:     Problems with dizziness:         Gastrointestinal    Blood in stool:     Vomited blood:         Genitourinary    Burning when urinating:     Blood in urine:        Psychiatric    Major depression:         Hematologic    Bleeding problems:    Problems with blood clotting too easily:        Skin    Rashes or ulcers:        Constitutional    Fever or chills:     PHYSICAL EXAM:   Vitals:   12/18/21 0900  BP: (!) 153/84  Pulse: 73  Resp: 20  Temp: 97.9 F (36.6 C)  SpO2: 93%  Weight: 115 lb (52.2 kg)  Height: 5\' 3"  (1.6 m)    GENERAL: The patient is a well-nourished female, in no acute distress. The vital signs are documented above. CARDIAC: There is a regular rate and rhythm.  VASCULAR: I do not detect carotid bruits. She has palpable dorsalis pedis pulses bilaterally. She has no significant lower extremity swelling. PULMONARY: There is good air exchange bilaterally without wheezing or rales. ABDOMEN: Soft and non-tender with normal pitched bowel sounds.  MUSCULOSKELETAL: There are no major deformities or cyanosis. NEUROLOGIC: No focal weakness or paresthesias are detected. SKIN: There are no ulcers or rashes noted. PSYCHIATRIC: The patient has a normal affect.  DATA:    CT ANGIOGRAM CHEST ABDOMEN PELVIS: There has been some progression of the distal descending thoracic aorta penetrating ulcer.  There is a 3.4 cm short segment dissection with no compromise of the lumen.  There is no evidence of aneurysm or periaortic inflammation.  On my  review of the films there is about a 2 cm segment between the SMA and  the distal aspect of the dissection.  This is nonflow limiting.  Deitra Mayo [facility] [phone]

## 2021-12-19 IMAGING — CT CT ANGIO CHEST
1 series · 1 of 1 positions shown · IV contrast (omnipaque)
Comparison: CT angiogram abdomen and pelvis including lower lung
regions April 05, 2021

CLINICAL DATA: Chest pain and altered mental status.

EXAM:
CT ANGIOGRAPHY CHEST WITH CONTRAST
TECHNIQUE: Multidetector CT imaging of the chest was performed using the
standard protocol during bolus administration of intravenous
contrast. Multiplanar CT image reconstructions and MIPs were
obtained to evaluate the vascular anatomy.
CONTRAST:  80mL OMNIPAQUE IOHEXOL 350 MG/ML SOLN

[Series 1: topogram 1.0 tr20 · sagittal · 2.00mm/px · 1 of 1 slices shown]
[im 1/1]
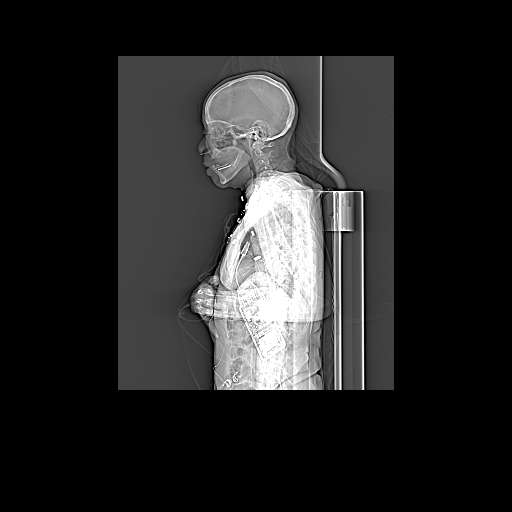

[1 of 1 positions shown; findings below may reference images not displayed]

FINDINGS: There are scattered foci calcification in visualized great vessels.
There are multiple foci of aortic atherosclerosis. There is an area
of apparent plaque ulceration along the leftward aspect of the
distal aorta near the gastroesophageal junction. The focus of plaque
ulceration measures 1.8 x 0.7 cm. There is less than 50% diameter
narrowing of the distal descending thoracic aorta in this area. No
other plaque ulceration evident on this study.

No pericardial effusion or pericardial thickening. There are foci of
aortic atherosclerosis. There are foci of coronary artery
calcification.

Mediastinum/Nodes: Thyroid appears unremarkable. No evident thoracic
adenopathy. No appreciable esophageal lesions.

Lungs/Pleura: There is scarring in the extreme lung apices. There is
mild bibasilar atelectasis. There is no edema or airspace opacity.
No pleural effusions. No pneumothorax. Trachea and major bronchial
structures appear patent.

Upper Abdomen: There are gallstones within the gallbladder. Contrast
is seen within the gallbladder from recent CT angiogram abdomen and
pelvis. Cyst arising from left kidney laterally measuring 1.8 x
cm noted.

Musculoskeletal: No blastic or lytic bone lesions. Degenerative
change noted in thoracic spine. There are no chest wall lesions.

Review of the MIP images confirms the above findings.
IMPRESSION: 1. Penetrating focal ulceration along the leftward aspect of the
descending thoracic aorta near the gastroesophageal junction level,
stable. No similar changes elsewhere. No frank dissection. Moderate
atherosclerotic plaque in this region of ulceration noted. No
thoracic aortic aneurysm.

2.  No evident pulmonary embolus.

3. Bibasilar atelectasis. No edema or airspace opacity. No pleural
effusions.

4.  Cholelithiasis.

5. There are foci of great vessel and coronary artery calcification.

Aortic Atherosclerosis ([id number]).

## 2021-12-22 ENCOUNTER — Other Ambulatory Visit: Payer: Self-pay | Admitting: Adult Health

## 2021-12-22 DIAGNOSIS — R21 Rash and other nonspecific skin eruption: Secondary | ICD-10-CM

## 2021-12-29 NOTE — Progress Notes (Signed)
[facility] Note  12/30/2021     CHIEF COMPLAINT Patient presents for Retina Follow Up  HISTORY OF PRESENT ILLNESS: Alejandra Davis is a 86 y.o. female who presents to the clinic today for:  HPI     Retina Follow Up   Patient presents with  CRVO/BRVO.  In right eye.  This started 5 weeks ago.  I, the attending physician,  performed the HPI with the patient and updated documentation appropriately.        Comments   Patient here for 5 weeks retina follow up for BRVO OD. Patient states vision doing alright. No eye pain.      Last edited by [name], MD on 12/30/2021 11:59 AM.      Referring physician: No referring provider defined for this encounter.  HISTORICAL INFORMATION:   Selected notes from the MEDICAL RECORD NUMBER Referred by [name] for DM exam   CURRENT MEDICATIONS: Current Outpatient Medications (Ophthalmic Drugs)  Medication Sig   dorzolamide-timolol (COSOPT) 22.3-6.8 MG/ML ophthalmic solution Place 1 drop into both eyes 2 (two) times daily.   No current facility-administered medications for this visit. (Ophthalmic Drugs)   Current Outpatient Medications (Other)  Medication Sig   amLODipine (NORVASC) 10 MG tablet TAKE 1 TABLET(10 MG) BY MOUTH DAILY   ASPIRIN LOW DOSE 81 MG EC tablet Take 81 mg by mouth daily.   Lancets Ultra Thin 30G MISC 100 each by Does not apply route 3 (three) times a week.   levothyroxine (SYNTHROID) 75 MCG tablet Take 1 tablet (75 mcg total) by mouth daily.   lisinopril (ZESTRIL) 2.5 MG tablet Take 1 tablet (2.5 mg total) by mouth daily.   loratadine (CLARITIN) 10 MG tablet TAKE 1 TABLET(10 MG) BY MOUTH DAILY   melatonin 3 MG TABS tablet Take 1 tablet (3 mg total) by mouth at bedtime. May repeat x 1 dose if still unable to sleep   metoprolol tartrate (LOPRESSOR) 50 MG tablet TAKE 1 TABLET(50 MG) BY MOUTH DAILY   triamcinolone ointment (KENALOG) 0.5 % APPLY TOPICALLY TO THE AFFECTED AREA TWICE DAILY  FOR 14 DAYS   No current facility-administered medications for this visit. (Other)   REVIEW OF SYSTEMS: ROS   Positive for: Skin, Genitourinary, Musculoskeletal, Endocrine, Cardiovascular, Eyes Negative for: Constitutional, Gastrointestinal, Neurological, HENT, Respiratory, Psychiatric, Allergic/Imm, Heme/Lymph Last edited by [name], COA on 12/30/2021 10:11 AM.     ALLERGIES Allergies  Allergen Reactions   Donepezil Hcl     Stomach cramps    PAST MEDICAL HISTORY Past Medical History:  Diagnosis Date   Chicken pox    Cystocele    Diabetes mellitus without complication ([place])    Glaucoma    Hypertension    Hypertensive retinopathy    OU   Neuromuscular disorder ([place])    Dementia    Thyroid disease    hypothyroidism   Past Surgical History:  Procedure Laterality Date   ABDOMINAL HYSTERECTOMY  1990   TAH BSO   CATARACT EXTRACTION Bilateral    Dr. [name]   COLONOSCOPY WITH PROPOFOL N/A 04/08/2021   Procedure: COLONOSCOPY WITH PROPOFOL;  Surgeon: [name], MD;  Location: [facility];  Service: Endoscopy;  Laterality: N/A;   EYE SURGERY Bilateral    Cat Sx OU   OOPHORECTOMY     BSO   Vaginal Bx     Papilloma    FAMILY HISTORY Family History  Problem Relation Age of Onset   Sickle cell anemia  Other    Hypertension Mother    Cancer Father        LIVER   Heart disease Brother    Cancer Brother        STOMACH   SOCIAL HISTORY Social History   Tobacco Use   Smoking status: Never   Smokeless tobacco: Never  Vaping Use   Vaping Use: Never used  Substance Use Topics   Alcohol use: No   Drug use: No       OPHTHALMIC EXAM: Base Eye Exam     Visual Acuity (Snellen - Linear)       Right Left   Dist [place] 20/200 -2 20/30 -1   Dist ph [place] NI 20/20 -2         Tonometry (Tonopen, 10:09 AM)       Right Left   Pressure 15 13         Pupils       Dark Light Shape React APD   Right 3 2 Round Minimal None   Left 3 2 Round Minimal None          Visual Fields (Counting fingers)       Left Right    Full Full         Extraocular Movement       Right Left    Full, Ortho Full, Ortho         Neuro/Psych     Oriented x3: Yes   Mood/Affect: Normal         Dilation     Both eyes: 1.0% Mydriacyl, 2.5% Phenylephrine @ 10:09 AM           Slit Lamp and Fundus Exam     Slit Lamp Exam       Right Left   Lids/Lashes Dermatochalasis - upper lid, Meibomian gland dysfunction Dermatochalasis - upper lid, Meibomian gland dysfunction   Conjunctiva/Sclera Melanosis Melanosis   Cornea Arcus, 1+ Punctate epithelial erosions, tear film debris, 1+ Guttata Arcus, Inferior 1+ Punctate epithelial erosions, well healed cataract wound   Anterior Chamber Deep and quiet Deep;  no cell or flare   Iris Round and dilated, No NVI Round and dilated, No NVI   Lens PC IOL in good position, 2+ Posterior capsular opacification Posterior chamber intraocular lens in good position, 1-2+ PCO.   Anterior Vitreous Vitreous syneresis Vitreous syneresis         Fundus Exam       Right Left   Disc Sharp rim, inferior notch, 3+ pallor, inf. Rim thinning, attenuated vessels superiorly, +disc heme at 1100, fine vascular loops superiorly 2+ pallor, Sharp rim, thin inferior rim   C/D Ratio 0.85 0.6   Macula Blunted foveal reflex, severe edema superior mac--improved; interval improvement in DBH superior mac Flat, good foveal reflex, mild RPE mottling and clumping, Epiretinal membrane, No heme or edema   Vessels superior BRVO with severe attenuation of ST arterioles, Tortuous attenuated, mild tortuousity   Periphery Attached, DBH superior hemishphere extended from BRVO--improving Attached, mild, scattered Reticular degeneration, No heme           Refraction     Wearing Rx       Sphere Cylinder Axis Add   Right -0.75 +1.25 170 +2.50   Left -0.75 +1.00 003 +2.50    Type: PAL         Wearing Rx #2       Sphere Cylinder Axis Add    Right -0.75 +1.25 170 +2.50  Left -0.75 +1.00 003 +2.50    Type: PAL            IMAGING AND PROCEDURES  Imaging and Procedures for 03/07/18  OCT, Retina - OU - Both Eyes       Right Eye Quality was good. Central Foveal Thickness: 246. Progression has been stable. Findings include epiretinal membrane, abnormal foveal contour, no SRF, outer retinal atrophy, subretinal hyper-reflective material, intraretinal hyper-reflective material, no IRF (Persistent trace cystic changes, central SRHM and patchy ORA ).   Left Eye Quality was good. Central Foveal Thickness: 252. Progression has been stable. Findings include abnormal foveal contour, epiretinal membrane, no IRF, no SRF, vitreomacular adhesion (Blunted foveal depression--stable).   Notes *Images captured and stored on drive  Diagnosis / Impression:  ERM OU OD: BRVO w/ stable improvement in CME and foveal contour; just trace cystic changes remain; central SRHM and patchy ORA OS: very mild ERM with blunted foveal depression -- stable; mild VMA  Clinical management:  See below  Abbreviations: NFP - Normal foveal profile. CME - cystoid macular edema. PED - pigment epithelial detachment. IRF - intraretinal fluid. SRF - subretinal fluid. EZ - ellipsoid zone. ERM - epiretinal membrane. ORA - outer retinal atrophy. ORT - outer retinal tubulation. SRHM - subretinal hyper-reflective material       Intravitreal Injection, Pharmacologic Agent - OD - Right Eye       Time Out 12/30/2021. 10:53 AM. Confirmed correct patient, procedure, site, and patient consented.   Anesthesia Topical anesthesia was used. Anesthetic medications included Lidocaine 2%, Proparacaine 0.5%.   Procedure Preparation included 5% betadine to ocular surface, eyelid speculum. A supplied needle was used.   Injection: 1.25 mg Bevacizumab 1.6m/0.05ml   Route: Intravitreal, Site: Right Eye   NDC:: 41324-401-02 Lot: [id number] , Expiration date: 02/03/2022,  Waste: 0 mL   Post-op Post injection exam found visual acuity of at least counting fingers. The patient tolerated the procedure well. There were no complications. The patient received written and verbal post procedure care education. Post injection medications were not given.            ASSESSMENT/PLAN:   ICD-10-CM   1. Branch retinal vein occlusion of right eye with macular edema  H34.8310 OCT, Retina - OU - Both Eyes    Intravitreal Injection, Pharmacologic Agent - OD - Right Eye    Bevacizumab (AVASTIN) SOLN 1.25 mg    2. Diabetes mellitus type 2 without retinopathy [place]  E11.9     3. Epiretinal membrane (ERM) of both eyes  H35.373     4. Pseudophakia of both eyes  Z96.1     5. Ocular hypertension of right eye  H40.051     6. Glaucoma suspect of both eyes  H40.003     7. Essential hypertension  I10     8. Hypertensive retinopathy of both eyes  H35.033       1. BRVO with CME OD  - lost to follow up from 02.22.22 to 11.1.22 -- re-presented with massive CME and BCVA 20/400 from 20/30  - initial OCT w/ CME superior macula  - s/p IVA OD #1 (10.18.19), #2 (11.19.19), #3 (12.17.19), #4 (02.19.20), #5 (03.23.20), #6 (04.21.20), #7 (05.27.20), #8 (07.07.20), #9 (08.25.20), #10 (9.29.20), #11 (11.17.20), #12 (12.22.20), #13 (01.27.21), #14 (03.10.21), #15 (04.21.21), #16 (05.26.21), #17 (06.30.21), #18 (08.04.21), #19 (09.01.21), #20 (10.06.21), #21 (11.17.21), #22 (12.30.21), #23 (2.22.22), #24 (11.1.22), #25 (11.29.22)  - good response to medications, but held IVA in January 2020 due to cataract  surgery  - today, BCVA stable at 20/200 -- improvement limited by central atrophy  - OCT shows persistent trace cystic changes remain; central SRHM and patchy ORA at 5 wks   - recommend IVA OD #26 today, 01.03.23 w f/u in 6 wks  - pt wishes to proceed  - RBA of procedure discussed, questions answered  - informed consent obtained   - Avastin informed consent form signed and scanned on  01.27.2021  - see procedure note  - history of interval increase in IRF/cystic changes at 6+ weeks interval  - f/u 6 weeks -- DFE/OCT, possible injection   2. Diabetes mellitus, type 2 without retinopathy  - The incidence, risk factors for progression, natural history and treatment options for diabetic retinopathy  were discussed with patient.     - The need for close monitoring of blood glucose, blood pressure, and serum lipids, avoiding cigarette or any type of tobacco, and the need for long term follow up was also discussed with patient.   3. Epiretinal membrane, OU  - relatively mild ERM OU -- blunted central foveal depression  - OD with mild cystic changes 2/2 BRVO as above; OS without cystic changes or edema  - discussed findings and prognosis   - monitor  4. Pseudophakia OU  - s/p CE/IOL OS 11.13.19 w/ Dr. [name]  - s/p CE/IOL OD 01.16.20 w/ Dr. [name]  - beautiful surgeries -- IOLs in perfect position  - healing well post-operatively  - IRF/CME OD may have been partly due to post op CME ([place]) in addition to BRVO  5,6. Ocular hypertension / Glaucoma suspect OD>OS  - IOP ~25 OD, 20 OS at prior clinic visits  - today IOP 15,13  - denies any family hx of glaucoma  - under the expert care of Dr. [name]  - uses Cosopt BID OU  7,8. Hypertensive retinopathy OU  - pt presented to 6.24.2020 visit urgently for right sided headache above right eye with extension to occiput  - BP at that time was 201/100 -- pt was sent to primary care clinic for urgent evaluation and meds were adjusted  - BP now under better control and headaches improved  - BP reading 11.1.22 -- 180/84 -- advised f/u with PCP  - discussed importance of tight BP control  Ophthalmic Meds Ordered this visit:  Meds ordered this encounter  Medications   Bevacizumab (AVASTIN) SOLN 1.25 mg     Return in about 6 weeks (around 02/10/2022) for f/u BRVO OD, DFE, OCT.  There are no Patient Instructions on file for  this visit.  This document serves as a record of services personally performed by Gardiner Sleeper, MD, PhD. It was created on their behalf by Estill Bakes, COT an ophthalmic technician. The creation of this record is the provider's dictation and/or activities during the visit.    Electronically signed by: Estill Bakes, COT 1.2.23 @ 12:08 PM   This document serves as a record of services personally performed by Gardiner Sleeper, MD, PhD. It was created on their behalf by San Jetty. Owens Shark, OA an ophthalmic technician. The creation of this record is the provider's dictation and/or activities during the visit.    Electronically signed by: San Jetty. Owens Shark, [place] 01.03.2023 12:08 PM  Gardiner Sleeper, M.D., Ph.D. Diseases & Surgery of the Retina and Vitreous [facility]  I have reviewed the above documentation for accuracy and completeness, and I agree with the above. Gardiner Sleeper, M.D.,  Ph.D. 12/30/21 12:08 PM   Abbreviations: M myopia (nearsighted); A astigmatism; H hyperopia (farsighted); P presbyopia; Mrx spectacle prescription;  CTL contact lenses; OD right eye; OS left eye; OU both eyes  XT exotropia; ET esotropia; PEK punctate epithelial keratitis; PEE punctate epithelial erosions; DES dry eye syndrome; MGD meibomian gland dysfunction; ATs artificial tears; PFAT's preservative free artificial tears; [place] nuclear sclerotic cataract; PSC posterior subcapsular cataract; ERM epi-retinal membrane; PVD posterior vitreous detachment; RD retinal detachment; DM diabetes mellitus; DR diabetic retinopathy; NPDR non-proliferative diabetic retinopathy; PDR proliferative diabetic retinopathy; CSME clinically significant macular edema; DME diabetic macular edema; dbh dot blot hemorrhages; CWS cotton wool spot; POAG primary open angle glaucoma; C/D cup-to-disc ratio; HVF humphrey visual field; GVF goldmann visual field; OCT optical coherence tomography; IOP intraocular pressure; BRVO Branch  retinal vein occlusion; CRVO central retinal vein occlusion; CRAO central retinal artery occlusion; BRAO branch retinal artery occlusion; RT retinal tear; SB scleral buckle; PPV pars plana vitrectomy; VH Vitreous hemorrhage; PRP panretinal laser photocoagulation; IVK intravitreal kenalog; VMT vitreomacular traction; MH Macular hole;  NVD neovascularization of the disc; NVE neovascularization elsewhere; AREDS age related eye disease study; ARMD age related macular degeneration; POAG primary open angle glaucoma; EBMD epithelial/anterior basement membrane dystrophy; ACIOL anterior chamber intraocular lens; IOL intraocular lens; PCIOL posterior chamber intraocular lens; Phaco/IOL phacoemulsification with intraocular lens placement; [place] photorefractive keratectomy; LASIK laser assisted in situ keratomileusis; HTN hypertension; DM diabetes mellitus; COPD chronic obstructive pulmonary disease

## 2021-12-30 ENCOUNTER — Ambulatory Visit (INDEPENDENT_AMBULATORY_CARE_PROVIDER_SITE_OTHER): Payer: Medicare PPO | Admitting: Ophthalmology

## 2021-12-30 ENCOUNTER — Other Ambulatory Visit: Payer: Self-pay

## 2021-12-30 ENCOUNTER — Encounter (INDEPENDENT_AMBULATORY_CARE_PROVIDER_SITE_OTHER): Payer: Self-pay | Admitting: Ophthalmology

## 2021-12-30 DIAGNOSIS — H35033 Hypertensive retinopathy, bilateral: Secondary | ICD-10-CM | POA: Diagnosis not present

## 2021-12-30 DIAGNOSIS — I10 Essential (primary) hypertension: Secondary | ICD-10-CM | POA: Diagnosis not present

## 2021-12-30 DIAGNOSIS — Z961 Presence of intraocular lens: Secondary | ICD-10-CM | POA: Diagnosis not present

## 2021-12-30 DIAGNOSIS — H34831 Tributary (branch) retinal vein occlusion, right eye, with macular edema: Secondary | ICD-10-CM

## 2021-12-30 DIAGNOSIS — H35373 Puckering of macula, bilateral: Secondary | ICD-10-CM

## 2021-12-30 DIAGNOSIS — H40051 Ocular hypertension, right eye: Secondary | ICD-10-CM

## 2021-12-30 DIAGNOSIS — H40003 Preglaucoma, unspecified, bilateral: Secondary | ICD-10-CM

## 2021-12-30 DIAGNOSIS — E119 Type 2 diabetes mellitus without complications: Secondary | ICD-10-CM

## 2021-12-30 DIAGNOSIS — H3581 Retinal edema: Secondary | ICD-10-CM

## 2021-12-30 MED ORDER — BEVACIZUMAB CHEMO INJECTION 1.25MG/0.05ML SYRINGE FOR KALEIDOSCOPE
1.2500 mg | INTRAVITREAL | Status: AC | PRN
  Administered 2021-12-30: 1.25 mg via INTRAVITREAL

## 2022-01-08 ENCOUNTER — Encounter (HOSPITAL_BASED_OUTPATIENT_CLINIC_OR_DEPARTMENT_OTHER): Payer: Medicare PPO | Attending: Internal Medicine | Admitting: Internal Medicine

## 2022-01-08 ENCOUNTER — Other Ambulatory Visit: Payer: Self-pay

## 2022-01-08 DIAGNOSIS — S80822D Blister (nonthermal), left lower leg, subsequent encounter: Secondary | ICD-10-CM | POA: Insufficient documentation

## 2022-01-08 DIAGNOSIS — E11621 Type 2 diabetes mellitus with foot ulcer: Secondary | ICD-10-CM | POA: Diagnosis not present

## 2022-01-08 DIAGNOSIS — L97411 Non-pressure chronic ulcer of right heel and midfoot limited to breakdown of skin: Secondary | ICD-10-CM | POA: Insufficient documentation

## 2022-01-08 DIAGNOSIS — S90822D Blister (nonthermal), left foot, subsequent encounter: Secondary | ICD-10-CM | POA: Diagnosis not present

## 2022-01-08 DIAGNOSIS — S90821D Blister (nonthermal), right foot, subsequent encounter: Secondary | ICD-10-CM | POA: Diagnosis not present

## 2022-01-08 DIAGNOSIS — L97512 Non-pressure chronic ulcer of other part of right foot with fat layer exposed: Secondary | ICD-10-CM | POA: Diagnosis not present

## 2022-01-08 DIAGNOSIS — X58XXXD Exposure to other specified factors, subsequent encounter: Secondary | ICD-10-CM | POA: Insufficient documentation

## 2022-01-08 NOTE — Progress Notes (Signed)
Alejandra Davis, Alejandra Davis ([id number]) Visit Report for 01/08/2022 Abuse/Suicide Risk Screen Details Patient Name: Date of Service: Alejandra Davis, [name] 01/08/2022 9:00 A M Medical Record Number: [id number] Patient Account Number: [id number] Date of Birth/Sex: Treating RN: Dec 03, 1936 (86 y.o. [name] Primary Care Ethelbert Thain: MA Ander Gaster NIE Other Clinician: Referring Shadow Schedler: Treating Symphoni Helbling/Extender: Wilber Bihari in Treatment: 0 Abuse/Suicide Risk Screen Items Answer ABUSE RISK SCREEN: Has anyone close to you tried to hurt or harm you recentlyo No Do you feel uncomfortable with anyone in your familyo No Has anyone forced you do things that you didnt want to doo No Electronic Signature(s) Signed: 01/08/2022 6:02:23 PM By: Baruch Gouty RN, BSN Entered By: Baruch Gouty on 01/08/2022 09:45:27 -------------------------------------------------------------------------------- Activities of Daily Living Details Patient Name: Date of Service: Alejandra Davis, [name] 01/08/2022 9:00 A M Medical Record Number: [id number] Patient Account Number: [id number] Date of Birth/Sex: Treating RN: 28-Jul-1936 (86 y.o. [name] Primary Care Massimo Hartland: MA Ander Gaster NIE Other Clinician: Referring Kay Shippy: Treating Lovett Coffin/Extender: Wilber Bihari in Treatment: 0 Activities of Daily Living Items Answer Activities of Daily Living (Please select one for each item) Drive Automobile Not Able T Medications ake Need Assistance Use T elephone Completely Able Care for Appearance Completely Able Use T oilet Completely Able Bath / Shower Completely Able Dress Self Completely Able Feed Self Completely Able Walk Completely Able Get In / Out Bed Completely Able Housework Completely Able Prepare Meals Completely [place] Need Assistance Shop for Self Need Assistance Electronic Signature(s) Signed: 01/08/2022 6:02:23 PM By:  Baruch Gouty RN, BSN Entered By: Baruch Gouty on 01/08/2022 09:46:06 -------------------------------------------------------------------------------- Education Screening Details Patient Name: Date of Service: Alejandra Davis, [name] 01/08/2022 9:00 A M Medical Record Number: [id number] Patient Account Number: [id number] Date of Birth/Sex: Treating RN: August 09, 1936 (86 y.o. [name] Primary Care Audery Wassenaar: MA Ander Gaster NIE Other Clinician: Referring Tanicia Wolaver: Treating Janalyn Higby/Extender: Wilber Bihari in Treatment: 0 Primary Learner Assessed: Patient Learning Preferences/Education Level/Primary Language Learning Preference: Explanation, Demonstration, Printed Material Highest Education Level: College or Above Preferred Language: English Cognitive Barrier Language Barrier: No Translator Needed: No Memory Deficit: Yes poor historian Emotional Barrier: No Cultural/Religious Beliefs Affecting Medical Care: No Physical Barrier Impaired Vision: Yes Glasses Impaired Hearing: No Decreased Hand dexterity: No Knowledge/Comprehension Knowledge Level: Medium Comprehension Level: Medium Ability to understand written instructions: Medium Ability to understand verbal instructions: Medium Motivation Anxiety Level: Calm Cooperation: Cooperative Education Importance: Acknowledges Need Interest in Health Problems: Asks Questions Perception: Coherent Willingness to Engage in Self-Management High Activities: Readiness to Engage in Self-Management High Activities: Electronic Signature(s) Signed: 01/08/2022 6:02:23 PM By: Baruch Gouty RN, BSN Entered By: Baruch Gouty on 01/08/2022 09:47:00 -------------------------------------------------------------------------------- Fall Risk Assessment Details Patient Name: Date of Service: Alejandra Davis, [name] 01/08/2022 9:00 A M Medical Record Number: [id number] Patient Account Number: [id number] Date of  Birth/Sex: Treating RN: 1936-10-04 (86 y.o. [name] Primary Care Thales Knipple: MA Ander Gaster NIE Other Clinician: Referring Skyler Carel: Treating Arafat Cocuzza/Extender: Wilber Bihari in Treatment: 0 Fall Risk Assessment Items Have you had 2 or more falls in the last 12 monthso 0 No Have you had any fall that resulted in injury in the last 12 monthso 0 No FALLS RISK SCREEN History of falling - immediate or within 3 months 0 No Secondary diagnosis (Do you have 2 or more medical diagnoseso) 0 No Ambulatory aid None/bed rest/wheelchair/nurse 0 Yes Crutches/cane/walker 0 No  Furniture 0 No Intravenous therapy Access/Saline/Heparin Lock 0 No Gait/Transferring Normal/ bed rest/ wheelchair 0 Yes Weak (short steps with or without shuffle, stooped but able to lift head while walking, may seek 0 No support from furniture) Impaired (short steps with shuffle, may have difficulty arising from chair, head down, impaired 0 No balance) Mental Status Oriented to own ability 0 Yes Electronic Signature(s) Signed: 01/08/2022 6:02:23 PM By: Baruch Gouty RN, BSN Entered By: Baruch Gouty on 01/08/2022 09:47:17 -------------------------------------------------------------------------------- Foot Assessment Details Patient Name: Date of Service: Alejandra Davis, [name] 01/08/2022 9:00 A M Medical Record Number: [id number] Patient Account Number: [id number] Date of Birth/Sex: Treating RN: Feb 18, 1936 (86 y.o. [name] Primary Care Bodie Abernethy: MA Ander Gaster NIE Other Clinician: Referring Domenik Trice: Treating Srihari Shellhammer/Extender: Wilber Bihari in Treatment: 0 Foot Assessment Items Site Locations + = Sensation present, - = Sensation absent, C = Callus, U = Ulcer R = Redness, W = Warmth, M = Maceration, PU = Pre-ulcerative lesion F = Fissure, S = Swelling, D = Dryness Assessment Right: Left: Other Deformity: No No Prior Foot Ulcer:  No No Prior Amputation: No No Charcot Joint: No No Ambulatory Status: Ambulatory Without Help Gait: Steady Electronic Signature(s) Signed: 01/08/2022 6:02:23 PM By: Baruch Gouty RN, BSN Entered By: Baruch Gouty on 01/08/2022 09:49:34 -------------------------------------------------------------------------------- Nutrition Risk Screening Details Patient Name: Date of Service: Alejandra Davis, [name] 01/08/2022 9:00 A M Medical Record Number: [id number] Patient Account Number: [id number] Date of Birth/Sex: Treating RN: 1936/11/26 (86 y.o. [name] Primary Care Tkeyah Burkman: MA Ander Gaster NIE Other Clinician: Referring Early Ord: Treating Hailee Hollick/Extender: Wilber Bihari in Treatment: 0 Height (in): 64 Weight (lbs): 111 Body Mass Index (BMI): 19.1 Nutrition Risk Screening Items Score Screening NUTRITION RISK SCREEN: I have an illness or condition that made me change the kind and/or amount of food I eat 0 No I eat fewer than two meals per day 0 No I eat few fruits and vegetables, or milk products 0 No I have three or more drinks of beer, liquor or wine almost every day 0 No I have tooth or mouth problems that make it hard for me to eat 0 No I don't always have enough money to buy the food I need 0 No I eat alone most of the time 0 No I take three or more different prescribed or over-the-counter drugs a day 1 Yes Without wanting to, I have lost or gained 10 pounds in the last six months 0 No I am not always physically able to shop, cook and/or feed myself 0 No Nutrition Protocols Good Risk Protocol 0 No interventions needed Moderate Risk Protocol High Risk Proctocol Risk Level: Good Risk Score: 1 Electronic Signature(s) Signed: 01/08/2022 6:02:23 PM By: Baruch Gouty RN, BSN Entered By: Baruch Gouty on 01/08/2022 09:47:40

## 2022-01-09 NOTE — Progress Notes (Signed)
Alejandra Davis, Alejandra Davis ([id number]) Visit Report for 01/08/2022 Chief Complaint Document Details Patient Name: Date of Service: Alejandra Davis, [name] 01/08/2022 9:00 A M Medical Record Number: [id number] Patient Account Number: [id number] Date of Birth/Sex: Treating RN: 07-19-1936 (86 y.o. Debby Bud Primary Care Provider: MA Ander Gaster NIE Other Clinician: Referring Provider: Treating Provider/Extender: Wilber Bihari in Treatment: 0 Information Obtained from: Patient Chief Complaint 01/08/2022; patient Davis here for review of blistering wounds on her bilateral feet and left lower leg Electronic Signature(s) Signed: 01/08/2022 4:29:39 PM By: Linton Ham MD Entered By: Linton Ham on 01/08/2022 10:51:36 -------------------------------------------------------------------------------- Debridement Details Patient Name: Date of Service: Alejandra Davis, [name] 01/08/2022 9:00 A M Medical Record Number: [id number] Patient Account Number: [id number] Date of Birth/Sex: Treating RN: [birth year] (86 y.o. Helene Shoe, Meta.Reding Primary Care Provider: MA Ander Gaster NIE Other Clinician: Referring Provider: Treating Provider/Extender: Wilber Bihari in Treatment: 0 Debridement Performed for Assessment: Wound #1 Right,Lateral Foot Performed By: Physician Ricard Dillon., MD Debridement Type: Debridement Severity of Tissue Pre Debridement: Fat layer exposed Level of Consciousness (Pre-procedure): Awake and Alert Pre-procedure Verification/Time Out Yes - 10:15 Taken: Start Time: 10:20 T Area Debrided (L x W): otal 0.5 (cm) x 0.7 (cm) = 0.35 (cm) Tissue and other material debrided: Non-Viable, Skin: Epidermis Level: Skin/Epidermis Debridement Description: Selective/Open Wound Instrument: Curette Bleeding: Minimum Hemostasis Achieved: Pressure Procedural Pain: 0 Post Procedural Pain: 0 Response to Treatment: Procedure was tolerated  well Level of Consciousness (Post- Awake and Alert procedure): Post Debridement Measurements of Total Wound Length: (cm) 0.5 Width: (cm) 0.7 Depth: (cm) 0.1 Volume: (cm) 0.027 Character of Wound/Ulcer Post Debridement: Improved Severity of Tissue Post Debridement: Fat layer exposed Post Procedure Diagnosis Same as Pre-procedure Electronic Signature(s) Signed: 01/08/2022 4:29:39 PM By: Linton Ham MD Signed: 01/09/2022 1:40:06 PM By: Deon Pilling RN, BSN Entered By: Linton Ham on 01/08/2022 10:50:41 -------------------------------------------------------------------------------- HPI Details Patient Name: Date of Service: Alejandra Davis, [name] 01/08/2022 9:00 A M Medical Record Number: [id number] Patient Account Number: [id number] Date of Birth/Sex: Treating RN: 06/11/1936 (86 y.o. Debby Bud Primary Care Provider: MA Ander Gaster NIE Other Clinician: Referring Provider: Treating Provider/Extender: Wilber Bihari in Treatment: 0 History of Present Illness HPI Description: ADMISSION 01/08/2022 This Davis a 86 year old woman who was accompanied by her caregiver. She Davis apparently independent however has some cognitive issues. She Davis not a good historian. She was seen in the urgent care on 12/01/2021 with blisters on the lateral aspect of both feet felt to be secondary to venous stasis. Her caregiver had a single picture that showed a large blister on the left anterior lower leg as well as the right lateral heel and foot and left foot. There was some edema. She was apparently given a course of antibiotics/Cephalexin. She was later seen by primary care at [facility]. She was referred here I think after receiving topical steroids to the area. She arrives in today with almost everything healed including the left anterior lower leg left lateral foot large area on the right lateral heel and right lateral foot. The only area that Davis open Davis a small area on the  right lateral foot that Davis still weeping edema fluid. Past medical history includes type 2 diabetes (diet controlled), thoracic aortic aneurysm followed by Dr. Doren Custard, hypothyroidism, cataract surgery. ABIs in our clinic were 1.13 on the right and 1.15 on the left Electronic Signature(s) Signed: 01/08/2022 4:29:39 PM By: Linton Ham  MD Entered By: Linton Ham on 01/08/2022 11:02:02 -------------------------------------------------------------------------------- Physical Exam Details Patient Name: Date of Service: Alejandra Davis, [name] 01/08/2022 9:00 A M Medical Record Number: [id number] Patient Account Number: [id number] Date of Birth/Sex: Treating RN: Jul 23, 1936 (86 y.o. Debby Bud Primary Care Provider: MA Ander Gaster NIE Other Clinician: Referring Provider: Treating Provider/Extender: Wilber Bihari in Treatment: 0 Constitutional Patient Davis hypertensive.. Pulse regular and within target range for patient.Marland Kitchen Respirations regular, non-labored and within target range.. Temperature Davis normal and within the target range for the patient.Marland Kitchen Appears in no distress. Respiratory work of breathing Davis normal. Bilateral breath sounds are clear and equal in all lobes with no wheezes, rales or rhonchi.. Cardiovascular Heart sounds are normal 2 out of 6 systolic ejection murmur sounds benign jugular venous pressure Davis not elevated there Davis no sacral edema. Pedal pulses Palpable bilaterally at both the dorsalis pedis and posterior tibial. Today there was minimal to no edema bilaterally. No visible signs of venous hypertension.. Integumentary (Hair, Skin) No obvious blistering skin disease in any other area I looked. Notes Wound exam; on the right lateral heel distally there Davis still a small area that Davis not fully epithelialized but almost all the other blistered areas including the left anterior lower tibia, left lateral foot right lateral heel and foot are healed  and epithelialized. She does not appear to have any other skin issues on her bilateral thighs Electronic Signature(s) Signed: 01/08/2022 4:29:39 PM By: Linton Ham MD Entered By: Linton Ham on 01/08/2022 10:58:14 -------------------------------------------------------------------------------- Physician Orders Details Patient Name: Date of Service: Alejandra Davis, [name] 01/08/2022 9:00 A M Medical Record Number: [id number] Patient Account Number: [id number] Date of Birth/Sex: Treating RN: 09/27/1936 (86 y.o. [name] Primary Care Provider: MA Ander Gaster NIE Other Clinician: Referring Provider: Treating Provider/Extender: Wilber Bihari in Treatment: 0 Verbal / Phone Orders: No Diagnosis Coding Follow-up Appointments ppointment in 1 week. - Dr. Dellia Nims Return A Edema Control - Lymphedema / SCD / Other Elevate legs to the level of the heart or above for 30 minutes daily and/or when sitting, a frequency of: - whenever sitting throughout the day Avoid standing for long periods of time. Exercise regularly Moisturize legs daily. Wound Treatment Wound #1 - Foot Wound Laterality: Right, Lateral Cleanser: Byram Ancillary Kit - 15 Day Supply (DME) (Generic) Every Other Day/30 Days Discharge Instructions: Use supplies as instructed; Kit contains: (15) Saline Bullets; (15) 3x3 Gauze; 15 pr Gloves Prim Dressing: KerraCel Ag Gelling Fiber Dressing, 2x2 in (silver alginate) (DME) (Generic) Every Other Day/30 Days ary Discharge Instructions: Apply silver alginate to wound bed as instructed Secondary Dressing: Bordered Gauze, 2x3.75 in (DME) (Generic) Every Other Day/30 Days Discharge Instructions: Apply over primary dressing as directed. Electronic Signature(s) Signed: 01/08/2022 4:29:39 PM By: Linton Ham MD Signed: 01/08/2022 6:02:23 PM By: Baruch Gouty RN, BSN Entered By: Baruch Gouty on 01/08/2022  10:26:12 -------------------------------------------------------------------------------- Problem List Details Patient Name: Date of Service: Alejandra Davis, [name] 01/08/2022 9:00 A M Medical Record Number: [id number] Patient Account Number: [id number] Date of Birth/Sex: Treating RN: [birth year] (86 y.o. Debby Bud Primary Care Provider: MA Ander Gaster NIE Other Clinician: Referring Provider: Treating Provider/Extender: Wilber Bihari in Treatment: 0 Active Problems ICD-10 Encounter Code Description Active Date MDM Diagnosis L97.411 Non-pressure chronic ulcer of right heel and midfoot limited to breakdown of 01/08/2022 No Yes skin S90.821D Blister (nonthermal), right foot, subsequent encounter 01/08/2022 No Yes S90.822D Blister (nonthermal),  left foot, subsequent encounter 01/08/2022 No Yes S80.822D Blister (nonthermal), left lower leg, subsequent encounter 01/08/2022 No Yes E11.621 Type 2 diabetes mellitus with foot ulcer 01/08/2022 No Yes Inactive Problems Resolved Problems Electronic Signature(s) Signed: 01/08/2022 4:29:39 PM By: Linton Ham MD Entered By: Linton Ham on 01/08/2022 11:01:36 -------------------------------------------------------------------------------- Progress Note Details Patient Name: Date of Service: Alejandra Davis, [name] 01/08/2022 9:00 A M Medical Record Number: [id number] Patient Account Number: [id number] Date of Birth/Sex: Treating RN: May 23, 1936 (86 y.o. Debby Bud Primary Care Provider: MA Ander Gaster NIE Other Clinician: Referring Provider: Treating Provider/Extender: Wilber Bihari in Treatment: 0 Subjective Chief Complaint Information obtained from Patient 01/08/2022; patient Davis here for review of blistering wounds on her bilateral feet and left lower leg History of Present Illness (HPI) ADMISSION 01/08/2022 This Davis a 86 year old woman who was accompanied by her caregiver. She  Davis apparently independent however has some cognitive issues. She Davis not a good historian. She was seen in the urgent care on 12/01/2021 with blisters on the lateral aspect of both feet felt to be secondary to venous stasis. Her caregiver had a single picture that showed a large blister on the left anterior lower leg as well as the right lateral heel and foot and left foot. There was some edema. She was apparently given a course of antibiotics/Cephalexin. She was later seen by primary care at [facility]. She was referred here I think after receiving topical steroids to the area. She arrives in today with almost everything healed including the left anterior lower leg left lateral foot large area on the right lateral heel and right lateral foot. The only area that Davis open Davis a small area on the right lateral foot that Davis still weeping edema fluid. Past medical history includes type 2 diabetes, thoracic aortic aneurysm followed by Dr. Doren Custard, hypothyroidism, cataract surgery. ABIs in our clinic were 1.13 on the right and 1.15 on the left Patient History Information obtained from Patient, Chart. Allergies donepezil Family History Cancer - Siblings,Father, Hypertension - Mother, No family history of Diabetes, Heart Disease, Hereditary Spherocytosis, Kidney Disease, Lung Disease, Seizures, Stroke, Thyroid Problems, Tuberculosis. Social History Never smoker, Marital Status - Divorced, Alcohol Use - Never, Drug Use - No History, Caffeine Use - Daily - coffee. Medical History Eyes Patient has history of Cataracts - removed, Glaucoma Denies history of Optic Neuritis Ear/Nose/Mouth/Throat Denies history of Chronic sinus problems/congestion, Middle ear problems Cardiovascular Patient has history of Hypertension Endocrine Patient has history of Type II Diabetes Genitourinary Denies history of End Stage Renal Disease Integumentary (Skin) Denies history of History of Burn Neurologic Patient has history of  Dementia Denies history of Neuropathy, Seizure Disorder Oncologic Denies history of Received Chemotherapy, Received Radiation Psychiatric Denies history of Anorexia/bulimia, Confinement Anxiety Patient Davis treated with Controlled Diet. Blood sugar Davis not tested. Hospitalization/Surgery History - abdominal hysterectomy. - cataract extraction. - oophrectomy. Medical A Surgical History Notes nd Cardiovascular aortic aneurysm Gastrointestinal diverticulosis, cloln polyp Endocrine hypothyroidism Genitourinary CKD Review of Systems (ROS) Constitutional Symptoms (General Health) Denies complaints or symptoms of Fatigue, Fever, Chills, Marked Weight Change. Eyes Complains or has symptoms of Glasses / Contacts - reading. Denies complaints or symptoms of Dry Eyes, Vision Changes. Ear/Nose/Mouth/Throat Denies complaints or symptoms of Chronic sinus problems or rhinitis. Respiratory Denies complaints or symptoms of Chronic or frequent coughs, Shortness of Breath. Cardiovascular Denies complaints or symptoms of Chest pain. Gastrointestinal Denies complaints or symptoms of Frequent diarrhea, Nausea, Vomiting. Endocrine Denies complaints or symptoms  of Heat/cold intolerance. Genitourinary Denies complaints or symptoms of Frequent urination. Integumentary (Skin) Complains or has symptoms of Wounds - right lateral foot. Musculoskeletal Denies complaints or symptoms of Muscle Pain, Muscle Weakness. Neurologic Denies complaints or symptoms of Numbness/parasthesias. Psychiatric Denies complaints or symptoms of Claustrophobia, Suicidal. Objective Constitutional Patient Davis hypertensive.. Pulse regular and within target range for patient.Marland Kitchen Respirations regular, non-labored and within target range.. Temperature Davis normal and within the target range for the patient.Marland Kitchen Appears in no distress. Vitals Time Taken: 9:33 AM, Height: 64 in, Source: Stated, Weight: 111 lbs, Source: Stated, BMI: 19.1,  Temperature: 98 F, Pulse: 89 bpm, Respiratory Rate: 18 breaths/min, Blood Pressure: 170/94 mmHg. Respiratory work of breathing Davis normal. Bilateral breath sounds are clear and equal in all lobes with no wheezes, rales or rhonchi.. Cardiovascular Heart sounds are normal 2 out of 6 systolic ejection murmur sounds benign jugular venous pressure Davis not elevated there Davis no sacral edema. Pedal pulses Palpable bilaterally at both the dorsalis pedis and posterior tibial. Today there was minimal to no edema bilaterally. No visible signs of venous hypertension.. General Notes: Wound exam; on the right lateral heel distally there Davis still a small area that Davis not fully epithelialized but almost all the other blistered areas including the left anterior lower tibia, left lateral foot right lateral heel and foot are healed and epithelialized. She does not appear to have any other skin issues on her bilateral thighs Integumentary (Hair, Skin) No obvious blistering skin disease in any other area I looked. Wound #1 status Davis Open. Original cause of wound was Blister. The date acquired was: 11/30/2021. The wound Davis located on the Right,Lateral Foot. The wound measures 0.5cm length x 0.7cm width x 0.1cm depth; 0.275cm^2 area and 0.027cm^3 volume. There Davis Fat Layer (Subcutaneous Tissue) exposed. There Davis no tunneling or undermining noted. There Davis a medium amount of serous drainage noted. The wound margin Davis flat and intact. There Davis large (67-100%) red granulation within the wound bed. There Davis no necrotic tissue within the wound bed. Assessment Active Problems ICD-10 Non-pressure chronic ulcer of right heel and midfoot limited to breakdown of skin Blister (nonthermal), right foot, subsequent encounter Blister (nonthermal), left foot, subsequent encounter Blister (nonthermal), left lower leg, subsequent encounter Procedures Wound #1 Pre-procedure diagnosis of Wound #1 Davis a Diabetic Wound/Ulcer of the Lower  Extremity located on the Right,Lateral Foot .Severity of Tissue Pre Debridement Davis: Fat layer exposed. There was a Selective/Open Wound Skin/Epidermis Debridement with a total area of 0.35 sq cm performed by Ricard Dillon., MD. With the following instrument(s): Curette to remove Non-Viable tissue/material. Material removed includes Skin: Epidermis. No specimens were taken. A time out was conducted at 10:15, prior to the start of the procedure. A Minimum amount of bleeding was controlled with Pressure. The procedure was tolerated well with a pain level of 0 throughout and a pain level of 0 following the procedure. Post Debridement Measurements: 0.5cm length x 0.7cm width x 0.1cm depth; 0.027cm^3 volume. Character of Wound/Ulcer Post Debridement Davis improved. Severity of Tissue Post Debridement Davis: Fat layer exposed. Post procedure Diagnosis Wound #1: Same as Pre-Procedure Plan Follow-up Appointments: Return Appointment in 1 week. - Dr. Dellia Nims Edema Control - Lymphedema / SCD / Other: Elevate legs to the level of the heart or above for 30 minutes daily and/or when sitting, a frequency of: - whenever sitting throughout the day Avoid standing for long periods of time. Exercise regularly Moisturize legs daily. WOUND #1: - Foot Wound  Laterality: Right, Lateral Cleanser: Byram Ancillary Kit - 15 Day Supply (DME) (Generic) Every Other Day/30 Days Discharge Instructions: Use supplies as instructed; Kit contains: (15) Saline Bullets; (15) 3x3 Gauze; 15 pr Gloves Prim Dressing: KerraCel Ag Gelling Fiber Dressing, 2x2 in (silver alginate) (DME) (Generic) Every Other Day/30 Days ary Discharge Instructions: Apply silver alginate to wound bed as instructed Secondary Dressing: Bordered Gauze, 2x3.75 in (DME) (Generic) Every Other Day/30 Days Discharge Instructions: Apply over primary dressing as directed. 1. Almost everything Davis healed here except the small area on the right lateral heel. We applied  silver alginate and a thick Band-Aid. 2. The exact cause of this patient's presentation Davis unclear. There does not seem to be sufficient evidence of edema at least today. No obvious evidence of venous insufficiency that Davis obvious although that Davis what was said on the urgent care visit. I am doubtful that this represented an infectious cause, contact dermatitis etc. it Davis comforting that almost all of this however Davis healing and I told the patient that I would watch this over time and only react if she develops further blisters especially in other locations. 3. Bullous skin diseases can present in older people however she would need a biopsy for direct immunofluorescence which we do not do here. And I would only consider this if she had recurrent problems. 4. I will see her back next week. This should be healed. I have no additional tests or antibiotics to order no cultures were done Electronic Signature(s) Signed: 01/08/2022 4:29:39 PM By: Linton Ham MD Entered By: Linton Ham on 01/08/2022 11:00:38 -------------------------------------------------------------------------------- HxROS Details Patient Name: Date of Service: Alejandra Davis, [name] 01/08/2022 9:00 A M Medical Record Number: [id number] Patient Account Number: [id number] Date of Birth/Sex: Treating RN: 1936/07/10 (85 y.o. [name] Primary Care Provider: MA Ander Gaster NIE Other Clinician: Referring Provider: Treating Provider/Extender: Wilber Bihari in Treatment: 0 Information Obtained From Patient Chart Constitutional Symptoms (General Health) Complaints and Symptoms: Negative for: Fatigue; Fever; Chills; Marked Weight Change Eyes Complaints and Symptoms: Positive for: Glasses / Contacts - reading Negative for: Dry Eyes; Vision Changes Medical History: Positive for: Cataracts - removed; Glaucoma Negative for: Optic Neuritis Ear/Nose/Mouth/Throat Complaints and  Symptoms: Negative for: Chronic sinus problems or rhinitis Medical History: Negative for: Chronic sinus problems/congestion; Middle ear problems Respiratory Complaints and Symptoms: Negative for: Chronic or frequent coughs; Shortness of Breath Cardiovascular Complaints and Symptoms: Negative for: Chest pain Medical History: Positive for: Hypertension Past Medical History Notes: aortic aneurysm Gastrointestinal Complaints and Symptoms: Negative for: Frequent diarrhea; Nausea; Vomiting Medical History: Past Medical History Notes: diverticulosis, cloln polyp Endocrine Complaints and Symptoms: Negative for: Heat/cold intolerance Medical History: Positive for: Type II Diabetes Past Medical History Notes: hypothyroidism Time with diabetes: unknown Treated with: Diet Blood sugar tested every day: No Genitourinary Complaints and Symptoms: Negative for: Frequent urination Medical History: Negative for: End Stage Renal Disease Past Medical History Notes: CKD Integumentary (Skin) Complaints and Symptoms: Positive for: Wounds - right lateral foot Medical History: Negative for: History of Burn Musculoskeletal Complaints and Symptoms: Negative for: Muscle Pain; Muscle Weakness Neurologic Complaints and Symptoms: Negative for: Numbness/parasthesias Medical History: Positive for: Dementia Negative for: Neuropathy; Seizure Disorder Psychiatric Complaints and Symptoms: Negative for: Claustrophobia; Suicidal Medical History: Negative for: Anorexia/bulimia; Confinement Anxiety Hematologic/Lymphatic Immunological Oncologic Medical History: Negative for: Received Chemotherapy; Received Radiation HBO Extended History Items Eyes: Eyes: Cataracts Glaucoma Immunizations Pneumococcal Vaccine: Received Pneumococcal Vaccination: No Implantable Devices None Hospitalization / Surgery History  Type of Hospitalization/Surgery abdominal hysterectomy cataract  extraction oophrectomy Family and Social History Cancer: Yes - Siblings,Father; Diabetes: No; Heart Disease: No; Hereditary Spherocytosis: No; Hypertension: Yes - Mother; Kidney Disease: No; Lung Disease: No; Seizures: No; Stroke: No; Thyroid Problems: No; Tuberculosis: No; Never smoker; Marital Status - Divorced; Alcohol Use: Never; Drug Use: No History; Caffeine Use: Daily - coffee; Financial Concerns: No; Food, Clothing or Shelter Needs: No; Support System Lacking: No; Transportation Concerns: No Electronic Signature(s) Signed: 01/08/2022 4:29:39 PM By: Linton Ham MD Signed: 01/08/2022 6:02:23 PM By: Baruch Gouty RN, BSN Entered By: Baruch Gouty on 01/08/2022 09:45:20 -------------------------------------------------------------------------------- [place] Details Patient Name: Date of Service: Alejandra Davis, [name] 01/08/2022 Medical Record Number: [id number] Patient Account Number: [id number] Date of Birth/Sex: Treating RN: [birth year] (86 y.o. Helene Shoe, Meta.Reding Primary Care Provider: MA Ander Gaster NIE Other Clinician: Referring Provider: Treating Provider/Extender: Wilber Bihari in Treatment: 0 Diagnosis Coding ICD-10 Codes Code Description O28.675 Non-pressure chronic ulcer of right heel and midfoot limited to breakdown of skin S90.821D Blister (nonthermal), right foot, subsequent encounter S90.822D Blister (nonthermal), left foot, subsequent encounter S80.822D Blister (nonthermal), left lower leg, subsequent encounter Facility Procedures CPT4 Code: 19824299 Description: [place] VISIT-LEV 3 EST PT Modifier: 25 Quantity: 1 CPT4 Code: [id number] Description: 22773 - DEBRIDE WOUND 1ST 20 SQ CM OR < ICD-10 Diagnosis Description L97.411 Non-pressure chronic ulcer of right heel and midfoot limited to breakdown of sk Modifier: in Quantity: 1 Physician Procedures : CPT4 Code Description Modifier [id number] WC PHYS LEVEL 3 NEW PT 25  ICD-10 Diagnosis Description L97.411 Non-pressure chronic ulcer of right heel and midfoot limited to breakdown of skin S90.821D Blister (nonthermal), right foot, subsequent encounter Quantity: 1 : [id number] - WC PHYS DEBR WO ANESTH 20 SQ CM ICD-10 Diagnosis Description L97.411 Non-pressure chronic ulcer of right heel and midfoot limited to breakdown of skin Quantity: 1 Electronic Signature(s) Signed: 01/08/2022 4:29:39 PM By: Linton Ham MD Signed: 01/08/2022 6:02:23 PM By: Baruch Gouty RN, BSN Entered By: Baruch Gouty on 01/08/2022 11:55:53

## 2022-01-09 NOTE — Progress Notes (Signed)
DELVINA, Alejandra Davis ([id number]) Visit Report for 01/08/2022 Allergy List Details Patient Name: Date of Service: Alejandra Davis, [name] 01/08/2022 9:00 A M Medical Record Number: [id number] Patient Account Number: [id number] Date of Birth/Sex: Treating RN: 1936/06/01 (86 y.o. [name] Primary Care Shaneika Rossa: MA Ander Gaster NIE Other Clinician: Referring Sherrin Stahle: Treating Sylvan Sookdeo/Extender: Wilber Bihari in Treatment: 0 Allergies Active Allergies donepezil Allergy Notes Electronic Signature(s) Signed: 01/08/2022 6:02:23 PM By: Baruch Gouty RN, BSN Entered By: Baruch Gouty on 01/08/2022 09:34:48 -------------------------------------------------------------------------------- Arrival Information Details Patient Name: Date of Service: Alejandra Davis, [name] 01/08/2022 9:00 A M Medical Record Number: [id number] Patient Account Number: [id number] Date of Birth/Sex: Treating RN: 11-11-1936 (86 y.o. [name] Primary Care Adeola Dennen: MA Ander Gaster NIE Other Clinician: Referring Ulysess Witz: Treating Annahi Short/Extender: Wilber Bihari in Treatment: 0 Visit Information Patient Arrived: Ambulatory Arrival Time: 09:24 Accompanied By: caregiver Transfer Assistance: None Patient Identification Verified: Yes Secondary Verification Process Completed: Yes Patient Requires Transmission-Based Precautions: No Patient Has Alerts: No Electronic Signature(s) Signed: 01/08/2022 6:02:23 PM By: Baruch Gouty RN, BSN Entered By: Baruch Gouty on 01/08/2022 41:63:84 -------------------------------------------------------------------------------- Clinic Level of Care Assessment Details Patient Name: Date of Service: Alejandra Davis, [name] 01/08/2022 9:00 A M Medical Record Number: [id number] Patient Account Number: [id number] Date of Birth/Sex: Treating RN: [birth year] (86 y.o. [name] Primary Care Anothony Bursch: MA Ander Gaster NIE  Other Clinician: Referring Riyaan Heroux: Treating Ski Polich/Extender: Wilber Bihari in Treatment: 0 Clinic Level of Care Assessment Items TOOL 2 Quantity Score []  - 0 Use when only an EandM Davis performed on the INITIAL visit ASSESSMENTS - Nursing Assessment / Reassessment X- 1 20 General Physical Exam (combine w/ comprehensive assessment (listed just below) when performed on new pt. evals) X- 1 25 Comprehensive Assessment (HX, ROS, Risk Assessments, Wounds Hx, etc.) ASSESSMENTS - Wound and Skin A ssessment / Reassessment X - Simple Wound Assessment / Reassessment - one wound 1 5 []  - 0 Complex Wound Assessment / Reassessment - multiple wounds []  - 0 Dermatologic / Skin Assessment (not related to wound area) ASSESSMENTS - Ostomy and/or Continence Assessment and Care []  - 0 Incontinence Assessment and Management []  - 0 Ostomy Care Assessment and Management (repouching, etc.) PROCESS - Coordination of Care X - Simple Patient / Family Education for ongoing care 1 15 []  - 0 Complex (extensive) Patient / Family Education for ongoing care X- 1 10 Staff obtains Programmer, systems, Records, T Results / Process Orders est []  - 0 Staff telephones HHA, Nursing Homes / Clarify orders / etc []  - 0 Routine Transfer to another Facility (non-emergent condition) []  - 0 Routine Hospital Admission (non-emergent condition) X- 1 15 New Admissions / Biomedical engineer / Ordering NPWT Apligraf, etc. , []  - 0 Emergency Hospital Admission (emergent condition) X- 1 10 Simple Discharge Coordination []  - 0 Complex (extensive) Discharge Coordination PROCESS - Special Needs []  - 0 Pediatric / Minor Patient Management []  - 0 Isolation Patient Management []  - 0 Hearing / Language / Visual special needs []  - 0 Assessment of Community assistance (transportation, D/C planning, etc.) []  - 0 Additional assistance / Altered mentation []  - 0 Support Surface(s) Assessment (bed,  cushion, seat, etc.) INTERVENTIONS - Wound Cleansing / Measurement X- 1 5 Wound Imaging (photographs - any number of wounds) []  - 0 Wound Tracing (instead of photographs) X- 1 5 Simple Wound Measurement - one wound []  - 0 Complex Wound Measurement - multiple wounds X- 1  5 Simple Wound Cleansing - one wound []  - 0 Complex Wound Cleansing - multiple wounds INTERVENTIONS - Wound Dressings X - Small Wound Dressing one or multiple wounds 1 10 []  - 0 Medium Wound Dressing one or multiple wounds []  - 0 Large Wound Dressing one or multiple wounds []  - 0 Application of Medications - injection INTERVENTIONS - Miscellaneous []  - 0 External ear exam []  - 0 Specimen Collection (cultures, biopsies, blood, body fluids, etc.) []  - 0 Specimen(s) / Culture(s) sent or taken to Lab for analysis []  - 0 Patient Transfer (multiple staff / Harrel Lemon Lift / Similar devices) []  - 0 Simple Staple / Suture removal (25 or less) []  - 0 Complex Staple / Suture removal (26 or more) []  - 0 Hypo / Hyperglycemic Management (close monitor of Blood Glucose) X- 1 15 Ankle / Brachial Index (ABI) - do not check if billed separately Has the patient been seen at the hospital within the last three years: Yes Total Score: 140 Level Of Care: New/Established - Level 4 Electronic Signature(s) Signed: 01/08/2022 6:02:23 PM By: Baruch Gouty RN, BSN Entered By: Baruch Gouty on 01/08/2022 10:18:48 -------------------------------------------------------------------------------- Encounter Discharge Information Details Patient Name: Date of Service: Alejandra Davis, [name] 01/08/2022 9:00 A M Medical Record Number: [id number] Patient Account Number: [id number] Date of Birth/Sex: Treating RN: 1936/01/28 (86 y.o. [name] Primary Care Laural Eiland: MA Ander Gaster NIE Other Clinician: Referring Johnattan Strassman: Treating Ulyssa Walthour/Extender: Wilber Bihari in Treatment: 0 Encounter Discharge  Information Items Post Procedure Vitals Discharge Condition: Stable Temperature (F): 98 Ambulatory Status: Ambulatory Pulse (bpm): 89 Discharge Destination: Home Respiratory Rate (breaths/min): 18 Transportation: Private Auto Blood Pressure (mmHg): 170/94 Accompanied By: caregiver Schedule Follow-up Appointment: Yes Clinical Summary of Care: Patient Declined Electronic Signature(s) Signed: 01/08/2022 6:02:23 PM By: Baruch Gouty RN, BSN Entered By: Baruch Gouty on 01/08/2022 11:57:03 -------------------------------------------------------------------------------- Lower Extremity Assessment Details Patient Name: Date of Service: Alejandra Davis, [name] 01/08/2022 9:00 A M Medical Record Number: [id number] Patient Account Number: [id number] Date of Birth/Sex: Treating RN: 05-13-1936 (86 y.o. [name] Primary Care Cordarrius Coad: MA Ander Gaster NIE Other Clinician: Referring Eragon Hammond: Treating Cru Kritikos/Extender: Wilber Bihari in Treatment: 0 Edema Assessment Assessed: [Left: No] [Right: No] Edema: [Left: No] [Right: No] Calf Left: Right: Point of Measurement: From Medial Instep 30 cm 31 cm Ankle Left: Right: Point of Measurement: From Medial Instep 20 cm 20.5 cm Vascular Assessment Pulses: Dorsalis Pedis Palpable: [Left:Yes] [Right:Yes] Blood Pressure: Brachial: [Left:170] [Right:170] Ankle: [Left:Posterior Tibial: 196 1.15] [Right:Posterior Tibial: 782 1.13] Notes left and right DP pulse noncompressible Electronic Signature(s) Signed: 01/08/2022 6:02:23 PM By: Baruch Gouty RN, BSN Entered By: Baruch Gouty on 01/08/2022 10:07:18 -------------------------------------------------------------------------------- Multi Wound Chart Details Patient Name: Date of Service: Alejandra Davis, [name] 01/08/2022 9:00 A M Medical Record Number: [id number] Patient Account Number: [id number] Date of Birth/Sex: Treating RN: 1936/09/05 (86 y.o. Debby Bud Primary Care Rissie Sculley: MA Ander Gaster NIE Other Clinician: Referring Pradyun Ishman: Treating Shilpa Bushee/Extender: Wilber Bihari in Treatment: 0 Vital Signs Height(in): 64 Pulse(bpm): 9 Weight(lbs): 111 Blood Pressure(mmHg): 170/94 Body Mass Index(BMI): 19 Temperature(F): 98 Respiratory Rate(breaths/min): 18 Photos: [N/A:N/A] Right, Lateral Foot N/A N/A Wound Location: Blister N/A N/A Wounding Event: Diabetic Wound/Ulcer of the Lower N/A N/A Primary Etiology: Extremity Cataracts, Glaucoma, Hypertension, N/A N/A Comorbid History: Type II Diabetes, Dementia 11/30/2021 N/A N/A Date Acquired: 0 N/A N/A Weeks of Treatment: Open N/A N/A Wound Status: 0.5x0.7x0.1 N/A N/A Measurements L  x W x D (cm) 0.275 N/A N/A A (cm) : rea 0.027 N/A N/A Volume (cm) : 0.00% N/A N/A % Reduction in A rea: 0.00% N/A N/A % Reduction in Volume: Grade 1 N/A N/A Classification: Medium N/A N/A Exudate A mount: Serous N/A N/A Exudate Type: amber N/A N/A Exudate Color: Flat and Intact N/A N/A Wound Margin: Large (67-100%) N/A N/A Granulation A mount: Red N/A N/A Granulation Quality: None Present (0%) N/A N/A Necrotic A mount: Fat Layer (Subcutaneous Tissue): Yes N/A N/A Exposed Structures: Fascia: No Tendon: No Muscle: No Joint: No Bone: No Small (1-33%) N/A N/A Epithelialization: Debridement - Selective/Open Wound N/A N/A Debridement: Pre-procedure Verification/Time Out 10:15 N/A N/A Taken: Skin/Epidermis N/A N/A Level: 0.35 N/A N/A Debridement A (sq cm): rea Curette N/A N/A Instrument: Minimum N/A N/A Bleeding: Pressure N/A N/A Hemostasis Achieved: 0 N/A N/A Procedural Pain: 0 N/A N/A Post Procedural Pain: Procedure was tolerated well N/A N/A Debridement Treatment Response: 0.5x0.7x0.1 N/A N/A Post Debridement Measurements L x W x D (cm) 0.027 N/A N/A Post Debridement Volume: (cm) Debridement N/A N/A Procedures  Performed: Treatment Notes Electronic Signature(s) Signed: 01/08/2022 4:29:39 PM By: Linton Ham MD Signed: 01/09/2022 1:40:06 PM By: Deon Pilling RN, BSN Entered By: Linton Ham on 01/08/2022 10:50:27 -------------------------------------------------------------------------------- Multi-Disciplinary Care Plan Details Patient Name: Date of Service: Alejandra Davis, [name] 01/08/2022 9:00 A M Medical Record Number: [id number] Patient Account Number: [id number] Date of Birth/Sex: Treating RN: 1936/09/09 (86 y.o. [name] Primary Care Ailanie Ruttan: MA Ander Gaster NIE Other Clinician: Referring Maxxon Schwanke: Treating Calvary Difranco/Extender: Wilber Bihari in Treatment: 0 Multidisciplinary Care Plan reviewed with physician Active Inactive Wound/Skin Impairment Nursing Diagnoses: Impaired tissue integrity Knowledge deficit related to ulceration/compromised skin integrity Goals: Patient/caregiver will verbalize understanding of skin care regimen Date Initiated: 01/08/2022 Target Resolution Date: 02/05/2022 Goal Status: Active Ulcer/skin breakdown will have a volume reduction of 30% by week 4 Date Initiated: 01/08/2022 Target Resolution Date: 02/05/2022 Goal Status: Active Interventions: Assess patient/caregiver ability to obtain necessary supplies Assess patient/caregiver ability to perform ulcer/skin care regimen upon admission and as needed Assess ulceration(s) every visit Provide education on ulcer and skin care Treatment Activities: Skin care regimen initiated : 01/08/2022 Topical wound management initiated : 01/08/2022 Notes: Electronic Signature(s) Signed: 01/08/2022 6:02:23 PM By: Baruch Gouty RN, BSN Entered By: Baruch Gouty on 01/08/2022 10:16:29 -------------------------------------------------------------------------------- Pain Assessment Details Patient Name: Date of Service: Alejandra Davis, [name] 01/08/2022 9:00 A M Medical Record Number:  [id number] Patient Account Number: [id number] Date of Birth/Sex: Treating RN: 1936/07/07 (86 y.o. [name] Primary Care Saleena Tamas: MA Ander Gaster NIE Other Clinician: Referring Zoraya Fiorenza: Treating Ethel Veronica/Extender: Wilber Bihari in Treatment: 0 Active Problems Location of Pain Severity and Description of Pain Patient Has Paino No Site Locations Rate the pain. Current Pain Level: 0 Pain Management and Medication Current Pain Management: Electronic Signature(s) Signed: 01/08/2022 6:02:23 PM By: Baruch Gouty RN, BSN Entered By: Baruch Gouty on 01/08/2022 09:56:44 -------------------------------------------------------------------------------- Patient/Caregiver Education Details Patient Name: Date of Service: Alejandra Davis, [name] 1/12/2023andnbsp9:00 A M Medical Record Number: [id number] Patient Account Number: [id number] Date of Birth/Gender: Treating RN: February 02, 1936 (85 y.o. [name] Primary Care Physician: MA Ander Gaster NIE Other Clinician: Referring Physician: Treating Physician/Extender: Wilber Bihari in Treatment: 0 Education Assessment Education Provided To: Patient Education Topics Provided Elevated Blood Sugar/ Impact on Healing: Handouts: Elevated Blood Sugars: How Do They Affect Wound Healing Methods: Explain/Verbal, Printed Responses: Reinforcements needed, State  content correctly [place]: Welcome T [facility] [place] o Methods: Explain/Verbal, Printed Responses: Reinforcements needed, State content correctly Wound/Skin Impairment: Handouts: Caring for Your Ulcer, Skin Care Do's and Dont's Methods: Explain/Verbal, Printed Responses: Reinforcements needed, State content correctly Electronic Signature(s) Signed: 01/08/2022 6:02:23 PM By: Baruch Gouty RN, BSN Entered By: Baruch Gouty on 01/08/2022  10:17:27 -------------------------------------------------------------------------------- Wound Assessment Details Patient Name: Date of Service: Alejandra Davis, [name] 01/08/2022 9:00 A M Medical Record Number: [id number] Patient Account Number: [id number] Date of Birth/Sex: Treating RN: 10/01/1936 (86 y.o. [name] Primary Care Kallista Pae: MA Ander Gaster NIE Other Clinician: Referring Najir Roop: Treating Sladen Plancarte/Extender: Wilber Bihari in Treatment: 0 Wound Status Wound Number: 1 Primary Diabetic Wound/Ulcer of the Lower Extremity Etiology: Wound Location: Right, Lateral Foot Wound Status: Open Wounding Event: Blister Comorbid Cataracts, Glaucoma, Hypertension, Type II Diabetes, Date Acquired: 11/30/2021 History: Dementia Weeks Of Treatment: 0 Clustered Wound: No Photos Wound Measurements Length: (cm) 0.5 Width: (cm) 0.7 Depth: (cm) 0.1 Area: (cm) 0.275 Volume: (cm) 0.027 % Reduction in Area: 0% % Reduction in Volume: 0% Epithelialization: Small (1-33%) Tunneling: No Undermining: No Wound Description Classification: Grade 1 Wound Margin: Flat and Intact Exudate Amount: Medium Exudate Type: Serous Exudate Color: amber Foul Odor After Cleansing: No Slough/Fibrino No Wound Bed Granulation Amount: Large (67-100%) Exposed Structure Granulation Quality: Red Fascia Exposed: No Necrotic Amount: None Present (0%) Fat Layer (Subcutaneous Tissue) Exposed: Yes Tendon Exposed: No Muscle Exposed: No Joint Exposed: No Bone Exposed: No Treatment Notes Wound #1 (Foot) Wound Laterality: Right, Lateral Cleanser [place] Ancillary Kit - 15 Day Supply Discharge Instruction: Use supplies as instructed; Kit contains: (15) Saline Bullets; (15) 3x3 Gauze; 15 pr Gloves Peri-Wound Care Topical Primary Dressing KerraCel Ag Gelling Fiber Dressing, 2x2 in (silver alginate) Discharge Instruction: Apply silver alginate to wound bed as  instructed Secondary Dressing Bordered Gauze, 2x3.75 in Discharge Instruction: Apply over primary dressing as directed. Secured With Compression Wrap Compression Stockings Environmental education officer) Signed: 01/08/2022 6:02:23 PM By: Baruch Gouty RN, BSN Entered By: Baruch Gouty on 01/08/2022 09:59:12 -------------------------------------------------------------------------------- Vitals Details Patient Name: Date of Service: Alejandra Davis, [name] 01/08/2022 9:00 A M Medical Record Number: [id number] Patient Account Number: [id number] Date of Birth/Sex: Treating RN: 1936/06/14 (86 y.o. [name] Primary Care Andee Chivers: MA Ander Gaster NIE Other Clinician: Referring Rochelle Nephew: Treating Terika Pillard/Extender: Wilber Bihari in Treatment: 0 Vital Signs Time Taken: 09:33 Temperature (F): 98 Height (in): 64 Pulse (bpm): 89 Source: Stated Respiratory Rate (breaths/min): 18 Weight (lbs): 111 Blood Pressure (mmHg): 170/94 Source: Stated Reference Range: 80 - 120 mg / dl Body Mass Index (BMI): 19.1 Electronic Signature(s) Signed: 01/08/2022 6:02:23 PM By: Baruch Gouty RN, BSN Entered By: Baruch Gouty on 01/08/2022 09:34:08

## 2022-01-15 ENCOUNTER — Other Ambulatory Visit: Payer: Self-pay

## 2022-01-15 ENCOUNTER — Encounter (HOSPITAL_BASED_OUTPATIENT_CLINIC_OR_DEPARTMENT_OTHER): Payer: Medicare PPO | Admitting: Internal Medicine

## 2022-01-15 DIAGNOSIS — I10 Essential (primary) hypertension: Secondary | ICD-10-CM | POA: Diagnosis not present

## 2022-01-15 DIAGNOSIS — S90821D Blister (nonthermal), right foot, subsequent encounter: Secondary | ICD-10-CM | POA: Diagnosis not present

## 2022-01-15 DIAGNOSIS — S80822D Blister (nonthermal), left lower leg, subsequent encounter: Secondary | ICD-10-CM | POA: Diagnosis not present

## 2022-01-15 DIAGNOSIS — E11621 Type 2 diabetes mellitus with foot ulcer: Secondary | ICD-10-CM | POA: Diagnosis not present

## 2022-01-15 DIAGNOSIS — S90822D Blister (nonthermal), left foot, subsequent encounter: Secondary | ICD-10-CM | POA: Diagnosis not present

## 2022-01-15 DIAGNOSIS — L97411 Non-pressure chronic ulcer of right heel and midfoot limited to breakdown of skin: Secondary | ICD-10-CM | POA: Diagnosis not present

## 2022-01-15 DIAGNOSIS — F0390 Unspecified dementia without behavioral disturbance: Secondary | ICD-10-CM | POA: Diagnosis not present

## 2022-01-15 DIAGNOSIS — L97511 Non-pressure chronic ulcer of other part of right foot limited to breakdown of skin: Secondary | ICD-10-CM | POA: Diagnosis not present

## 2022-01-15 NOTE — Progress Notes (Signed)
NIKEYA, MAXIM ([id number]) Visit Report for 01/15/2022 HPI Details Patient Name: Date of Service: [name], Lesle M. 01/15/2022 10:30 A M Medical Record Number: [id number] Patient Account Number: [id number] Date of Birth/Sex: Treating RN: 01/23/1936 (86 y.o. Debby Bud Primary Care Provider: Faustino Congress Other Clinician: Referring Provider: Treating Provider/Extender: Gardiner Ramus in Treatment: 1 History of Present Illness HPI Description: ADMISSION 01/08/2022 This is a 86 year old woman who was accompanied by her caregiver. She is apparently independent however has some cognitive issues. She is not a good historian. She was seen in the urgent care on 12/01/2021 with blisters on the lateral aspect of both feet felt to be secondary to venous stasis. Her caregiver had a single picture that showed a large blister on the left anterior lower leg as well as the right lateral heel and foot and left foot. There was some edema. She was apparently given a course of antibiotics/Cephalexin. She was later seen by primary care at [facility]. She was referred here I think after receiving topical steroids to the area. She arrives in today with almost everything healed including the left anterior lower leg left lateral foot large area on the right lateral heel and right lateral foot. The only area that is open is a small area on the right lateral foot that is still weeping edema fluid. Past medical history includes type 2 diabetes (diet controlled), thoracic aortic aneurysm followed by Dr. Doren Custard, hypothyroidism, cataract surgery. ABIs in our clinic were 1.13 on the right and 1.15 on the left 01/15/22 01/15/2022; the patient comes in with 2 new blisters 1 on the right lateral heel and one on the left anterior tibia lower aspect. Both of these are reasonably firm blisters Nikolsky sign is negative. There is no evidence of surrounding infection Electronic Signature(s) Signed:  01/15/2022 4:44:32 PM By: Linton Ham MD Entered By: Linton Ham on 01/15/2022 13:06:35 -------------------------------------------------------------------------------- Physical Exam Details Patient Name: Date of Service: [name], Lizza M. 01/15/2022 10:30 A M Medical Record Number: [id number] Patient Account Number: [id number] Date of Birth/Sex: Treating RN: Apr 23, 1936 (86 y.o. Debby Bud Primary Care Provider: Faustino Congress Other Clinician: Referring Provider: Treating Provider/Extender: Gardiner Ramus in Treatment: 1 Constitutional Patient is hypertensive.. Pulse regular and within target range for patient.Marland Kitchen Respirations regular, non-labored and within target range.. Temperature is normal and within the target range for the patient.Marland Kitchen Appears in no distress. Notes Wound exam; on the right lateral heel which was her original small area from last week she had tight intact blister. There is an additional area on the left anterior lower mid tibia again a smaller tight blister. There is no tenderness no surrounding infection that is obvious. Nikolsky sign is negative Engineer, maintenance) Signed: 01/15/2022 4:44:32 PM By: Linton Ham MD Entered By: Linton Ham on 01/15/2022 13:07:59 -------------------------------------------------------------------------------- Physician Orders Details Patient Name: Date of Service: [name], Julieann M. 01/15/2022 10:30 A M Medical Record Number: [id number] Patient Account Number: [id number] Date of Birth/Sex: Treating RN: September 02, 1936 (86 y.o. [name] Primary Care Provider: Faustino Congress Other Clinician: Referring Provider: Treating Provider/Extender: Gardiner Ramus in Treatment: 1 Verbal / Phone Orders: No Diagnosis Coding ICD-10 Coding Code Description L97.411 Non-pressure chronic ulcer of right heel and midfoot limited to breakdown of skin S90.821D  Blister (nonthermal), right foot, subsequent encounter S90.822D Blister (nonthermal), left foot, subsequent encounter S80.822D Blister (nonthermal), left lower leg, subsequent encounter E11.621 Type 2 diabetes mellitus with foot ulcer Follow-up  Appointments ppointment in 2 weeks. - Dr. Dellia Nims Return A Edema Control - Lymphedema / SCD / Other Elevate legs to the level of the heart or above for 30 minutes daily and/or when sitting, a frequency of: - whenever sitting throughout the day Avoid standing for long periods of time. Exercise regularly Moisturize legs daily. Non Wound Condition Other Non Wound Condition Orders/Instructions: - silver aglinate secured with a bordered gauze or bandaid to any blistered areas Wound Treatment Wound #1 - Foot Wound Laterality: Right, Lateral Cleanser: Byram Ancillary Kit - 15 Day Supply (Generic) Every Other Day/30 Days Discharge Instructions: Use supplies as instructed; Kit contains: (15) Saline Bullets; (15) 3x3 Gauze; 15 pr Gloves Prim Dressing: KerraCel Ag Gelling Fiber Dressing, 2x2 in (silver alginate) (Generic) Every Other Day/30 Days ary Discharge Instructions: Apply silver alginate to wound bed as instructed Secondary Dressing: Bordered Gauze, 2x3.75 in (Generic) Every Other Day/30 Days Discharge Instructions: Apply over primary dressing as directed. [facility] Dermatology Dr. Denna Haggard [facility] Dermatology - evaluation of blistering skin of lower extremities - (ICD10 Z36.644I - Blister (nonthermal), right foot, subsequent encounter) Electronic Signature(s) Signed: 01/15/2022 4:44:32 PM By: Linton Ham MD Signed: 01/15/2022 5:36:48 PM By: Baruch Gouty RN, BSN Entered By: Baruch Gouty on 01/15/2022 11:59:09 Prescription 01/15/2022 -------------------------------------------------------------------------------- Jacqulyn Cane MD Patient Name: Provider: 1936-12-21 [id number] Date of Birth: NPI#Jesse Sans Sex: DEA#: [id number] Phone #: License #: [place] Patient Address: [address] [place], [place] [address] [place], [place] [address] [phone] Allergies donepezil Provider's Orders Dermatology Dr. Denna Haggard - ICD10: [place] [facility] - evaluation of blistering skin of lower extremities Hand Signature: Date(s): Electronic Signature(s) Signed: 01/15/2022 4:44:32 PM By: Linton Ham MD Signed: 01/15/2022 5:36:48 PM By: Baruch Gouty RN, BSN Entered By: Baruch Gouty on 01/15/2022 11:59:10 -------------------------------------------------------------------------------- Problem List Details Patient Name: Date of Service: [name], Amire M. 01/15/2022 10:30 A M Medical Record Number: [id number] Patient Account Number: [id number] Date of Birth/Sex: Treating RN: Nov 06, 1936 (86 y.o. [name] Primary Care Provider: Faustino Congress Other Clinician: Referring Provider: Treating Provider/Extender: Gardiner Ramus in Treatment: 1 Active Problems ICD-10 Encounter Code Description Active Date MDM Diagnosis L97.411 Non-pressure chronic ulcer of right heel and midfoot limited to breakdown of 01/08/2022 No Yes skin S90.821D Blister (nonthermal), right foot, subsequent encounter 01/08/2022 No Yes S90.822D Blister (nonthermal), left foot, subsequent encounter 01/08/2022 No Yes S80.822D Blister (nonthermal), left lower leg, subsequent encounter 01/08/2022 No Yes E11.621 Type 2 diabetes mellitus with foot ulcer 01/08/2022 No Yes Inactive Problems Resolved Problems Electronic Signature(s) Signed: 01/15/2022 4:44:32 PM By: Linton Ham MD Entered By: Linton Ham on 01/15/2022 13:05:11 -------------------------------------------------------------------------------- Progress Note Details Patient Name: Date of Service: [name], Akyla M.  01/15/2022 10:30 A M Medical Record Number: [id number] Patient Account Number: [id number] Date of Birth/Sex: Treating RN: [birth year] (86 y.o. Debby Bud Primary Care Provider: Faustino Congress Other Clinician: Referring Provider: Treating Provider/Extender: Gardiner Ramus in Treatment: 1 Subjective History of Present Illness (HPI) ADMISSION 01/08/2022 This is a 86 year old woman who was accompanied by her caregiver. She is apparently independent however has some cognitive issues. She is not a good historian. She was seen in the urgent care on 12/01/2021 with blisters on the lateral aspect of both feet felt to be secondary to venous stasis. Her caregiver had a single picture that showed a large blister on the  left anterior lower leg as well as the right lateral heel and foot and left foot. There was some edema. She was apparently given a course of antibiotics/Cephalexin. She was later seen by primary care at [facility]. She was referred here I think after receiving topical steroids to the area. She arrives in today with almost everything healed including the left anterior lower leg left lateral foot large area on the right lateral heel and right lateral foot. The only area that is open is a small area on the right lateral foot that is still weeping edema fluid. Past medical history includes type 2 diabetes (diet controlled), thoracic aortic aneurysm followed by Dr. Doren Custard, hypothyroidism, cataract surgery. ABIs in our clinic were 1.13 on the right and 1.15 on the left 01/15/22 01/15/2022; the patient comes in with 2 new blisters 1 on the right lateral heel and one on the left anterior tibia lower aspect. Both of these are reasonably firm blisters Nikolsky sign is negative. There is no evidence of surrounding infection Objective Constitutional Patient is hypertensive.. Pulse regular and within target range for patient.Marland Kitchen Respirations regular, non-labored and within  target range.. Temperature is normal and within the target range for the patient.Marland Kitchen Appears in no distress. Vitals Time Taken: 10:56 AM, Height: 64 in, Weight: 111 lbs, BMI: 19.1, Temperature: 97.9 F, Pulse: 98 bpm, Respiratory Rate: 18 breaths/min, Blood Pressure: 152/88 mmHg. General Notes: Wound exam; on the right lateral heel which was her original small area from last week she had tight intact blister. There is an additional area on the left anterior lower mid tibia again a smaller tight blister. There is no tenderness no surrounding infection that is obvious. Nikolsky sign is negative Integumentary (Hair, Skin) Wound #1 status is Open. Original cause of wound was Blister. The date acquired was: 11/30/2021. The wound has been in treatment 1 weeks. The wound is located on the Right,Lateral Foot. The wound measures 0.1cm length x 0.1cm width x 0.1cm depth; 0.008cm^2 area and 0.001cm^3 volume. The wound is limited to skin breakdown. There is no tunneling or undermining noted. There is a medium amount of serous drainage noted. The wound margin is flat and intact. There is no granulation within the wound bed. There is no necrotic tissue within the wound bed. General Notes: 2.5 x 2.5 cm blister noted to lateral heel Assessment Active Problems ICD-10 Non-pressure chronic ulcer of right heel and midfoot limited to breakdown of skin Blister (nonthermal), right foot, subsequent encounter Blister (nonthermal), left foot, subsequent encounter Blister (nonthermal), left lower leg, subsequent encounter Type 2 diabetes mellitus with foot ulcer Plan Follow-up Appointments: Return Appointment in 2 weeks. - Dr. Dellia Nims Edema Control - Lymphedema / SCD / Other: Elevate legs to the level of the heart or above for 30 minutes daily and/or when sitting, a frequency of: - whenever sitting throughout the day Avoid standing for long periods of time. Exercise regularly Moisturize legs daily. Non Wound  Condition: Other Non Wound Condition Orders/Instructions: - silver aglinate secured with a bordered gauze or bandaid to any blistered areas ordered were: Dermatology Dr. Denna Haggard [facility] Dermatology - evaluation of blistering skin of lower extremities WOUND #1: - Foot Wound Laterality: Right, Lateral Cleanser: Byram Ancillary Kit - 15 Day Supply (Generic) Every Other Day/30 Days Discharge Instructions: Use supplies as instructed; Kit contains: (15) Saline Bullets; (15) 3x3 Gauze; 15 pr Gloves Prim Dressing: KerraCel Ag Gelling Fiber Dressing, 2x2 in (silver alginate) (Generic) Every Other Day/30 Days ary Discharge Instructions: Apply silver alginate  to wound bed as instructed Secondary Dressing: Bordered Gauze, 2x3.75 in (Generic) Every Other Day/30 Days Discharge Instructions: Apply over primary dressing as directed. 1. We are continuing to put silver alginate on this as a primary dressing. 2. I think she needs a dermatology review. She may need a blister biopsy and a PERI blister skin biopsy for direct immunofluorescence 3. I do not see the usual causes of blisters I see on the lower extremity I do not see any evidence of venous insufficiency, contact dermatitis, bullous impetigo etc. Electronic Signature(s) Signed: 01/15/2022 4:44:32 PM By: Linton Ham MD Entered By: Linton Ham on 01/15/2022 13:09:05 -------------------------------------------------------------------------------- SuperBill Details Patient Name: Date of Service: [name], Beauty M. 01/15/2022 Medical Record Number: [id number] Patient Account Number: [id number] Date of Birth/Sex: Treating RN: 08-Oct-1936 (86 y.o. [name] Primary Care Provider: Faustino Congress Other Clinician: Referring Provider: Treating Provider/Extender: Gardiner Ramus in Treatment: 1 Diagnosis Coding ICD-10 Codes Code Description [phone] Non-pressure chronic ulcer of right heel and midfoot limited  to breakdown of skin S90.821D Blister (nonthermal), right foot, subsequent encounter S90.822D Blister (nonthermal), left foot, subsequent encounter S80.822D Blister (nonthermal), left lower leg, subsequent encounter E11.621 Type 2 diabetes mellitus with foot ulcer Facility Procedures CPT4 Code: [id number] Description: 29244 - WOUND CARE VISIT-LEV 3 EST PT Modifier: Quantity: 1 Physician Procedures Electronic Signature(s) Signed: 01/15/2022 4:44:32 PM By: Linton Ham MD Entered By: Linton Ham on 01/15/2022 13:09:24

## 2022-01-15 NOTE — Progress Notes (Signed)
[name], [name] ([id number]) Visit Report for 01/15/2022 Arrival Information Details Patient Name: Date of Service: Alejandra V IS, [name] 01/15/2022 10:30 A M Medical Record Number: [id number] Patient Account Number: [id number] Date of Birth/Sex: Treating RN: 1936/11/24 (86 y.o. Helene Shoe, Tammi Klippel Primary Care Azekiel Cremer: Faustino Congress Other Clinician: Referring Jonesha Tsuchiya: Treating Allan Bacigalupi/Extender: Gardiner Ramus in Treatment: 1 Visit Information History Since Last Visit Added or deleted any medications: No Patient Arrived: Wheel Chair Any new allergies or adverse reactions: No Arrival Time: 10:55 Had a fall or experienced change in No Accompanied By: caregiver activities of daily living that may affect Transfer Assistance: None risk of falls: Patient Identification Verified: Yes Signs or symptoms of abuse/neglect since last visito No Secondary Verification Process Completed: Yes Hospitalized since last visit: No Patient Requires Transmission-Based Precautions: No Implantable device outside of the clinic excluding No Patient Has Alerts: No cellular tissue based products placed in the center since last visit: Has Dressing in Place as Prescribed: Yes Pain Present Now: No Electronic Signature(s) Signed: 01/15/2022 11:34:21 AM By: Sandre Kitty Entered By: Sandre Kitty on 01/15/2022 10:56:01 -------------------------------------------------------------------------------- Clinic Level of Care Assessment Details Patient Name: Date of Service: Alejandra V IS, [name] 01/15/2022 10:30 A M Medical Record Number: [id number] Patient Account Number: [id number] Date of Birth/Sex: Treating RN: [birth year] (86 y.o. [name] Primary Care Delos Klich: Faustino Congress Other Clinician: Referring Destaney Sarkis: Treating Safa Derner/Extender: Gardiner Ramus in Treatment: 1 Clinic Level of Care Assessment Items TOOL 4 Quantity Score _0  -  0 Use when only an EandM is performed on FOLLOW-UP visit ASSESSMENTS - Nursing Assessment / Reassessment X- 1 10 Reassessment of Co-morbidities (includes updates in patient status) X- 1 5 Reassessment of Adherence to Treatment Plan ASSESSMENTS - Wound and Skin A ssessment / Reassessment X - Simple Wound Assessment / Reassessment - one wound 1 5 _1  - 0 Complex Wound Assessment / Reassessment - multiple wounds X- 1 10 Dermatologic / Skin Assessment (not related to wound area) ASSESSMENTS - Focused Assessment _2  - 0 Circumferential Edema Measurements - multi extremities _3  - 0 Nutritional Assessment / Counseling / Intervention _4  - 0 Lower Extremity Assessment (monofilament, tuning fork, pulses) X- 1 10 Peripheral Arterial Disease Assessment (using hand held doppler) ASSESSMENTS - Ostomy and/or Continence Assessment and Care _5  - 0 Incontinence Assessment and Management _6  - 0 Ostomy Care Assessment and Management (repouching, etc.) PROCESS - Coordination of Care X - Simple Patient / Family Education for ongoing care 1 15 _7  - 0 Complex (extensive) Patient / Family Education for ongoing care X- 1 10 Staff obtains Programmer, systems, Records, T Results / Process Orders est _8  - 0 Staff telephones HHA, Nursing Homes / Clarify orders / etc _9  - 0 Routine Transfer to another Facility (non-emergent condition) _10  - 0 Routine Hospital Admission (non-emergent condition) _11  - 0 New Admissions / Biomedical engineer / Ordering NPWT Apligraf, etc. , _12  - 0 Emergency Hospital Admission (emergent condition) X- 1 10 Simple Discharge Coordination _13  - 0 Complex (extensive) Discharge Coordination PROCESS - Special Needs _14  - 0 Pediatric / Minor Patient Management _15  - 0 Isolation Patient Management _16  - 0 Hearing / Language / Visual special needs _17  - 0 Assessment of Community assistance (transportation, D/C planning, etc.) _18  - 0 Additional assistance / Altered mentation _19  -  0 Support Surface(s) Assessment (bed, cushion, seat, etc.) INTERVENTIONS - Wound Cleansing / Measurement X - Simple Wound Cleansing - one wound 1 5 _20  - 0 Complex Wound Cleansing -  multiple wounds X- 1 5 Wound Imaging (photographs - any number of wounds) _0  - 0 Wound Tracing (instead of photographs) X- 1 5 Simple Wound Measurement - one wound _1  - 0 Complex Wound Measurement - multiple wounds INTERVENTIONS - Wound Dressings X - Small Wound Dressing one or multiple wounds 2 10 _2  - 0 Medium Wound Dressing one or multiple wounds _3  - 0 Large Wound Dressing one or multiple wounds <[id number]>_5<\/HRCBULAGTXMIWOEH>_2  - 0 Application of Medications - topical <ZYYQMGNOIBBCWUGQ>_9<\/VQXIHWTUUEKCMKLK>_9  - 0 Application of Medications - injection INTERVENTIONS - Miscellaneous _6  - 0 External ear exam _7  - 0 Specimen Collection (cultures, biopsies, blood, body fluids, etc.) _8  - 0 Specimen(s) / Culture(s) sent or taken to Lab for analysis _9  - 0 Patient Transfer (multiple staff / Civil Service fast streamer / Similar devices) _10  - 0 Simple Staple / Suture removal (25 or less) _11  - 0 Complex Staple / Suture removal (26 or more) _12  - 0 Hypo / Hyperglycemic Management (close monitor of Blood Glucose) _13  - 0 Ankle / Brachial Index (ABI) - do not check if billed separately X- 1 5 Vital Signs Has the patient been seen at the hospital within the last three years: Yes Total Score: 115 Level Of Care: New/Established - Level 3 Electronic Signature(s) Signed: 01/15/2022 5:36:48 PM By: Baruch Gouty RN, BSN Entered By: Baruch Gouty on 01/15/2022 12:03:01 -------------------------------------------------------------------------------- Encounter Discharge Information Details Patient Name: Date of Service: Alejandra V IS, Zea M. 01/15/2022 10:30 A M Medical Record Number: [id number] Patient Account Number: [id number] Date of Birth/Sex: Treating RN: Mar 13, 1936 (86 y.o. [name] Primary Care Linetta Regner: Faustino Congress Other Clinician: Referring Paisleigh Maroney: Treating  Tawan Degroote/Extender: Gardiner Ramus in Treatment: 1 Encounter Discharge Information Items Discharge Condition: Stable Ambulatory Status: Ambulatory Discharge Destination: Home Transportation: Private Auto Accompanied By: caregiver Schedule Follow-up Appointment: Yes Clinical Summary of Care: Patient Declined Electronic Signature(s) Signed: 01/15/2022 5:36:48 PM By: Baruch Gouty RN, BSN Entered By: Baruch Gouty on 01/15/2022 12:05:00 -------------------------------------------------------------------------------- Lower Extremity Assessment Details Patient Name: Date of Service: Alejandra V IS, Jesyka M. 01/15/2022 10:30 A M Medical Record Number: [id number] Patient Account Number: [id number] Date of Birth/Sex: Treating RN: 1936-06-16 (86 y.o. [name] Primary Care Jalan Bodi: Faustino Congress Other Clinician: Referring Brendyn Mclaren: Treating Catalyna Reilly/Extender: Gardiner Ramus in Treatment: 1 Edema Assessment Assessed: Shirlyn Goltz: No] Patrice Paradise: No] Edema: [Left: No] [Right: No] Calf Left: Right: Point of Measurement: From Medial Instep 30 cm 31 cm Ankle Left: Right: Point of Measurement: From Medial Instep 20 cm 20.5 cm Vascular Assessment Pulses: Dorsalis Pedis Palpable: [Left:Yes] [Right:Yes] Electronic Signature(s) Signed: 01/15/2022 5:36:48 PM By: Baruch Gouty RN, BSN Entered By: Baruch Gouty on 01/15/2022 11:25:25 -------------------------------------------------------------------------------- Multi Wound Chart Details Patient Name: Date of Service: Alejandra V IS, Miaisabella M. 01/15/2022 10:30 A M Medical Record Number: [id number] Patient Account Number: [id number] Date of Birth/Sex: Treating RN: 10/18/1936 (86 y.o. Helene Shoe, Tammi Klippel Primary Care Mccrae Speciale: Faustino Congress Other Clinician: Referring Dalina Samara: Treating Myrtha Tonkovich/Extender: Gardiner Ramus in Treatment: 1 Vital  Signs Height(in): 64 Pulse(bpm): 98 Weight(lbs): 111 Blood Pressure(mmHg): 152/88 Body Mass Index(BMI): 19 Temperature(F): 97.9 Respiratory Rate(breaths/min): 18 Photos: [N/A:N/A] Right, Lateral Foot N/A N/A Wound Location: Blister N/A N/A Wounding Event: Diabetic Wound/Ulcer of the Lower N/A N/A Primary Etiology: Extremity Cataracts, Glaucoma, Hypertension, N/A N/A Comorbid History: Type II Diabetes, Dementia 11/30/2021 N/A N/A Date Acquired: 1 N/A N/A Weeks of Treatment: Open N/A N/A Wound Status: 0.1x0.1x0.1 N/A N/A Measurements L x W x D (cm) 0.008 N/A N/A  A (cm) : rea 0.001 N/A N/A Volume (cm) : 97.10% N/A N/A % Reduction in A rea: 96.30% N/A N/A % Reduction in Volume: Grade 1 N/A N/A Classification: Medium N/A N/A Exudate A mount: Serous N/A N/A Exudate Type: amber N/A N/A Exudate Color: Flat and Intact N/A N/A Wound Margin: None Present (0%) N/A N/A Granulation A mount: None Present (0%) N/A N/A Necrotic A mount: Fascia: No N/A N/A Exposed Structures: Fat Layer (Subcutaneous Tissue): No Tendon: No Muscle: No Joint: No Bone: No Limited to Skin Breakdown Large (67-100%) N/A N/A Epithelialization: 2.5 x 2.5 cm blister noted to lateral N/A N/A Assessment Notes: heel Treatment Notes Wound #1 (Foot) Wound Laterality: Right, Lateral Cleanser Byram Ancillary Kit - 15 Day Supply Discharge Instruction: Use supplies as instructed; Kit contains: (15) Saline Bullets; (15) 3x3 Gauze; 15 pr Gloves Peri-Wound Care Topical Primary Dressing KerraCel Ag Gelling Fiber Dressing, 2x2 in (silver alginate) Discharge Instruction: Apply silver alginate to wound bed as instructed Secondary Dressing Bordered Gauze, 2x3.75 in Discharge Instruction: Apply over primary dressing as directed. Secured With Compression Wrap Compression Stockings Environmental education officer) Signed: 01/15/2022 4:44:32 PM By: Linton Ham MD Signed: 01/15/2022 5:47:38 PM By:  Deon Pilling RN, BSN Entered By: Linton Ham on 01/15/2022 13:05:19 -------------------------------------------------------------------------------- Multi-Disciplinary Care Plan Details Patient Name: Date of Service: Alejandra V IS, Tamira M. 01/15/2022 10:30 A M Medical Record Number: [id number] Patient Account Number: [id number] Date of Birth/Sex: Treating RN: Mar 28, 1936 (86 y.o. [name] Primary Care Shannel Zahm: Faustino Congress Other Clinician: Referring Alvera Tourigny: Treating Lecil Tapp/Extender: Gardiner Ramus in Treatment: 1 Crocker reviewed with physician Active Inactive Wound/Skin Impairment Nursing Diagnoses: Impaired tissue integrity Knowledge deficit related to ulceration/compromised skin integrity Goals: Patient/caregiver will verbalize understanding of skin care regimen Date Initiated: 01/08/2022 Target Resolution Date: 02/05/2022 Goal Status: Active Ulcer/skin breakdown will have a volume reduction of 30% by week 4 Date Initiated: 01/08/2022 Target Resolution Date: 02/05/2022 Goal Status: Active Interventions: Assess patient/caregiver ability to obtain necessary supplies Assess patient/caregiver ability to perform ulcer/skin care regimen upon admission and as needed Assess ulceration(s) every visit Provide education on ulcer and skin care Treatment Activities: Skin care regimen initiated : 01/08/2022 Topical wound management initiated : 01/08/2022 Notes: Electronic Signature(s) Signed: 01/15/2022 5:36:48 PM By: Baruch Gouty RN, BSN Entered By: Baruch Gouty on 01/15/2022 11:33:17 -------------------------------------------------------------------------------- Non-Wound Condition Assessment Details Patient Name: Date of Service: Alejandra V IS, Chenoah M. 01/15/2022 10:30 A M Medical Record Number: [id number] Patient Account Number: [id number] Date of Birth/Sex: Treating RN: [birth year] (86 y.o. Helene Shoe, Tammi Klippel Primary  Care Shahida Schnackenberg: Faustino Congress Other Clinician: Referring Deya Bigos: Treating Klyn Kroening/Extender: Gardiner Ramus in Treatment: 1 Non-Wound Condition: Condition: Other Dermatologic Condition Location: Leg Side: Left Notes: .9 x .5 cm blister to anterior lower leg Photos Electronic Signature(s) Signed: 01/15/2022 11:34:21 AM By: Sandre Kitty Signed: 01/15/2022 5:47:38 PM By: Deon Pilling RN, BSN Entered By: Sandre Kitty on 01/15/2022 11:29:54 -------------------------------------------------------------------------------- Pain Assessment Details Patient Name: Date of Service: Alejandra V IS, Hermina M. 01/15/2022 10:30 A M Medical Record Number: [id number] Patient Account Number: [id number] Date of Birth/Sex: Treating RN: [birth year] (86 y.o. Helene Shoe, Tammi Klippel Primary Care Darcey Cardy: Faustino Congress Other Clinician: Referring Samanthajo Payano: Treating Xylia Scherger/Extender: Gardiner Ramus in Treatment: 1 Active Problems Location of Pain Severity and Description of Pain Patient Has Paino No Site Locations Pain Management and Medication Current Pain Management: Electronic Signature(s) Signed: 01/15/2022 11:34:21 AM By: Sandre Kitty Signed: 01/15/2022 5:47:38 PM By: Deon Pilling  RN, BSN Entered By: Sandre Kitty on 01/15/2022 10:56:36 -------------------------------------------------------------------------------- Patient/Caregiver Education Details Patient Name: Date of Service: Alejandra V IS, Lirio M. 1/19/2023andnbsp10:30 A M Medical Record Number: [id number] Patient Account Number: [id number] Date of Birth/Gender: Treating RN: 1936/01/08 (86 y.o. [name] Primary Care Physician: Faustino Congress Other Clinician: Referring Physician: Treating Physician/Extender: Gardiner Ramus in Treatment: 1 Education Assessment Education Provided To: Patient Education Topics Provided Wound/Skin  Impairment: Methods: Explain/Verbal Responses: Reinforcements needed, State content correctly Electronic Signature(s) Signed: 01/15/2022 5:36:48 PM By: Baruch Gouty RN, BSN Entered By: Baruch Gouty on 01/15/2022 11:33:43 -------------------------------------------------------------------------------- Wound Assessment Details Patient Name: Date of Service: Alejandra V IS, Antania M. 01/15/2022 10:30 A M Medical Record Number: [id number] Patient Account Number: [id number] Date of Birth/Sex: Treating RN: 1936/10/28 (86 y.o. Helene Shoe, Meta.Reding Primary Care Horace Wishon: Faustino Congress Other Clinician: Referring Brycelyn Gambino: Treating Junior Huezo/Extender: Gardiner Ramus in Treatment: 1 Wound Status Wound Number: 1 Primary Diabetic Wound/Ulcer of the Lower Extremity Etiology: Wound Location: Right, Lateral Foot Wound Status: Open Wounding Event: Blister Comorbid Cataracts, Glaucoma, Hypertension, Type II Diabetes, Date Acquired: 11/30/2021 History: Dementia Weeks Of Treatment: 1 Clustered Wound: No Photos Wound Measurements Length: (cm) 0.1 Width: (cm) 0.1 Depth: (cm) 0.1 Area: (cm) 0.008 Volume: (cm) 0.001 % Reduction in Area: 97.1% % Reduction in Volume: 96.3% Epithelialization: Large (67-100%) Tunneling: No Undermining: No Wound Description Classification: Grade 1 Wound Margin: Flat and Intact Exudate Amount: Medium Exudate Type: Serous Exudate Color: amber Foul Odor After Cleansing: No Slough/Fibrino No Wound Bed Granulation Amount: None Present (0%) Exposed Structure Necrotic Amount: None Present (0%) Fascia Exposed: No Fat Layer (Subcutaneous Tissue) Exposed: No Tendon Exposed: No Muscle Exposed: No Joint Exposed: No Bone Exposed: No Limited to Skin Breakdown Assessment Notes 2.5 x 2.5 cm blister noted to lateral heel Treatment Notes Wound #1 (Foot) Wound Laterality: Right, Lateral Cleanser Byram Ancillary Kit - 15 Day  Supply Discharge Instruction: Use supplies as instructed; Kit contains: (15) Saline Bullets; (15) 3x3 Gauze; 15 pr Gloves Peri-Wound Care Topical Primary Dressing KerraCel Ag Gelling Fiber Dressing, 2x2 in (silver alginate) Discharge Instruction: Apply silver alginate to wound bed as instructed Secondary Dressing Bordered Gauze, 2x3.75 in Discharge Instruction: Apply over primary dressing as directed. Secured With Compression Wrap Compression Stockings Environmental education officer) Signed: 01/15/2022 5:36:48 PM By: Baruch Gouty RN, BSN Signed: 01/15/2022 5:47:38 PM By: Deon Pilling RN, BSN Entered By: Baruch Gouty on 01/15/2022 11:26:53 -------------------------------------------------------------------------------- Vitals Details Patient Name: Date of Service: Alejandra V IS, Jerrye M. 01/15/2022 10:30 A M Medical Record Number: [id number] Patient Account Number: [id number] Date of Birth/Sex: Treating RN: 1936-03-09 (86 y.o. Helene Shoe, Tammi Klippel Primary Care Johnluke Haugen: Faustino Congress Other Clinician: Referring Pamelyn Bancroft: Treating Sevannah Madia/Extender: Gardiner Ramus in Treatment: 1 Vital Signs Time Taken: 10:56 Temperature (F): 97.9 Height (in): 64 Pulse (bpm): 98 Weight (lbs): 111 Respiratory Rate (breaths/min): 18 Body Mass Index (BMI): 19.1 Blood Pressure (mmHg): 152/88 Reference Range: 80 - 120 mg / dl Electronic Signature(s) Signed: 01/15/2022 11:34:21 AM By: Sandre Kitty Entered By: Sandre Kitty on 01/15/2022 10:56:28

## 2022-01-16 ENCOUNTER — Other Ambulatory Visit: Payer: Medicare PPO

## 2022-01-20 ENCOUNTER — Ambulatory Visit: Payer: Medicare PPO | Admitting: Family

## 2022-01-29 ENCOUNTER — Other Ambulatory Visit: Payer: Self-pay

## 2022-01-29 ENCOUNTER — Encounter (HOSPITAL_BASED_OUTPATIENT_CLINIC_OR_DEPARTMENT_OTHER): Payer: Medicare PPO | Attending: Internal Medicine | Admitting: Internal Medicine

## 2022-01-29 DIAGNOSIS — X58XXXD Exposure to other specified factors, subsequent encounter: Secondary | ICD-10-CM | POA: Insufficient documentation

## 2022-01-29 DIAGNOSIS — E039 Hypothyroidism, unspecified: Secondary | ICD-10-CM | POA: Insufficient documentation

## 2022-01-29 DIAGNOSIS — S90821D Blister (nonthermal), right foot, subsequent encounter: Secondary | ICD-10-CM | POA: Insufficient documentation

## 2022-01-29 DIAGNOSIS — S90822D Blister (nonthermal), left foot, subsequent encounter: Secondary | ICD-10-CM | POA: Insufficient documentation

## 2022-01-29 DIAGNOSIS — S80822D Blister (nonthermal), left lower leg, subsequent encounter: Secondary | ICD-10-CM | POA: Insufficient documentation

## 2022-01-29 DIAGNOSIS — F0390 Unspecified dementia without behavioral disturbance: Secondary | ICD-10-CM | POA: Diagnosis not present

## 2022-01-29 DIAGNOSIS — L97511 Non-pressure chronic ulcer of other part of right foot limited to breakdown of skin: Secondary | ICD-10-CM | POA: Diagnosis not present

## 2022-01-29 DIAGNOSIS — E11621 Type 2 diabetes mellitus with foot ulcer: Secondary | ICD-10-CM | POA: Diagnosis not present

## 2022-01-29 DIAGNOSIS — L97411 Non-pressure chronic ulcer of right heel and midfoot limited to breakdown of skin: Secondary | ICD-10-CM | POA: Diagnosis not present

## 2022-01-29 NOTE — Progress Notes (Signed)
ALEXYSS, Alejandra Davis ([id number]) Visit Report for 01/29/2022 Arrival Information Details Patient Name: Date of Service: Alejandra Davis, [name] 01/29/2022 9:30 Alejandra M Medical Record Number: [id number] Patient Account Number: [id number] Date of Birth/Sex: Treating RN: [birth year] (86 y.o. Martyn Malay, Linda Primary Care Cindee Mclester: Faustino Congress Other Clinician: Referring Gurdeep Keesey: Treating Nicolina Hirt/Extender: Gardiner Ramus in Treatment: 3 Visit Information History Since Last Visit Added or deleted any medications: No Patient Arrived: Ambulatory Any new allergies or adverse reactions: No Arrival Time: 09:37 Had Alejandra fall or experienced change in No Accompanied By: caregiver activities of daily living that may affect Transfer Assistance: None risk of falls: Patient Identification Verified: Yes Signs or symptoms of abuse/neglect since last visito No Secondary Verification Process Completed: Yes Hospitalized since last visit: No Patient Requires Transmission-Based Precautions: No Implantable device outside of the clinic excluding No Patient Has Alerts: No cellular tissue based products placed in the center since last visit: Has Dressing in Place as Prescribed: No Pain Present Now: No Electronic Signature(s) Signed: 01/29/2022 6:25:59 PM By: Baruch Gouty RN, BSN Entered By: Baruch Gouty on 01/29/2022 09:40:33 -------------------------------------------------------------------------------- Clinic Level of Care Assessment Details Patient Name: Date of Service: Alejandra Davis, [name] 01/29/2022 9:30 Alejandra M Medical Record Number: [id number] Patient Account Number: [id number] Date of Birth/Sex: Treating RN: 1936-05-09 (86 y.o. [name] Primary Care Alexsus Papadopoulos: Faustino Congress Other Clinician: Referring Shaquetta Arcos: Treating Kynzleigh Bandel/Extender: Gardiner Ramus in Treatment: 3 Clinic Level of Care Assessment Items TOOL 4 Quantity Score []  -  0 Use when only an EandM Davis performed on FOLLOW-UP visit ASSESSMENTS - Nursing Assessment / Reassessment X- 1 10 Reassessment of Co-morbidities (includes updates in patient status) X- 1 5 Reassessment of Adherence to Treatment Plan ASSESSMENTS - Wound and Skin Alejandra ssessment / Reassessment X - Simple Wound Assessment / Reassessment - one wound 1 5 []  - 0 Complex Wound Assessment / Reassessment - multiple wounds X- 1 10 Dermatologic / Skin Assessment (not related to wound area) ASSESSMENTS - Focused Assessment []  - 0 Circumferential Edema Measurements - multi extremities []  - 0 Nutritional Assessment / Counseling / Intervention X- 1 5 Lower Extremity Assessment (monofilament, tuning fork, pulses) []  - 0 Peripheral Arterial Disease Assessment (using hand held doppler) ASSESSMENTS - Ostomy and/or Continence Assessment and Care []  - 0 Incontinence Assessment and Management []  - 0 Ostomy Care Assessment and Management (repouching, etc.) PROCESS - Coordination of Care X - Simple Patient / Family Education for ongoing care 1 15 []  - 0 Complex (extensive) Patient / Family Education for ongoing care X- 1 10 Staff obtains Programmer, systems, Records, T Results / Process Orders est []  - 0 Staff telephones HHA, Nursing Homes / Clarify orders / etc []  - 0 Routine Transfer to another Facility (non-emergent condition) []  - 0 Routine Hospital Admission (non-emergent condition) []  - 0 New Admissions / Biomedical engineer / Ordering NPWT Apligraf, etc. , []  - 0 Emergency Hospital Admission (emergent condition) X- 1 10 Simple Discharge Coordination []  - 0 Complex (extensive) Discharge Coordination PROCESS - Special Needs []  - 0 Pediatric / Minor Patient Management []  - 0 Isolation Patient Management []  - 0 Hearing / Language / Visual special needs []  - 0 Assessment of Community assistance (transportation, D/C planning, etc.) []  - 0 Additional assistance / Altered mentation []  -  0 Support Surface(s) Assessment (bed, cushion, seat, etc.) INTERVENTIONS - Wound Cleansing / Measurement X - Simple Wound Cleansing - one wound 1 5 []  - 0 Complex Wound  Cleansing - multiple wounds X- 1 5 Wound Imaging (photographs - any number of wounds) []  - 0 Wound Tracing (instead of photographs) []  - 0 Simple Wound Measurement - one wound []  - 0 Complex Wound Measurement - multiple wounds INTERVENTIONS - Wound Dressings []  - 0 Small Wound Dressing one or multiple wounds []  - 0 Medium Wound Dressing one or multiple wounds []  - 0 Large Wound Dressing one or multiple wounds []  - 0 Application of Medications - topical []  - 0 Application of Medications - injection INTERVENTIONS - Miscellaneous []  - 0 External ear exam []  - 0 Specimen Collection (cultures, biopsies, blood, body fluids, etc.) []  - 0 Specimen(s) / Culture(s) sent or taken to Lab for analysis []  - 0 Patient Transfer (multiple staff / Civil Service fast streamer / Similar devices) []  - 0 Simple Staple / Suture removal (25 or less) []  - 0 Complex Staple / Suture removal (26 or more) []  - 0 Hypo / Hyperglycemic Management (close monitor of Blood Glucose) []  - 0 Ankle / Brachial Index (ABI) - do not check if billed separately X- 1 5 Vital Signs Has the patient been seen at the hospital within the last three years: Yes Total Score: 85 Level Of Care: New/Established - Level 3 Electronic Signature(s) Signed: 01/29/2022 6:25:59 PM By: Baruch Gouty RN, BSN Entered By: Baruch Gouty on 01/29/2022 10:13:26 -------------------------------------------------------------------------------- Lower Extremity Assessment Details Patient Name: Date of Service: Alejandra Davis, [name] 01/29/2022 9:30 Alejandra M Medical Record Number: [id number] Patient Account Number: [id number] Date of Birth/Sex: Treating RN: [birth year] (86 y.o. [name] Primary Care Falisa Lamora: Faustino Congress Other Clinician: Referring Achille Xiang: Treating  Muzammil Bruins/Extender: Gardiner Ramus in Treatment: 3 Edema Assessment Assessed: Shirlyn Goltz: No] Patrice Paradise: No] Edema: [Left: No] [Right: No] Calf Left: Right: Point of Measurement: From Medial Instep 30 cm 31 cm Ankle Left: Right: Point of Measurement: From Medial Instep 20 cm 20.5 cm Vascular Assessment Pulses: Dorsalis Pedis Palpable: [Left:Yes] [Right:Yes] Electronic Signature(s) Signed: 01/29/2022 6:25:59 PM By: Baruch Gouty RN, BSN Entered By: Baruch Gouty on 01/29/2022 09:47:27 -------------------------------------------------------------------------------- Multi Wound Chart Details Patient Name: Date of Service: Alejandra Davis, [name] 01/29/2022 9:30 Alejandra M Medical Record Number: [id number] Patient Account Number: [id number] Date of Birth/Sex: Treating RN: 1936/04/22 (86 y.o. [name] Primary Care Marvens Hollars: Faustino Congress Other Clinician: Referring Hester Joslin: Treating Yuval Rubens/Extender: Gardiner Ramus in Treatment: 3 Vital Signs Height(in): 64 Pulse(bpm): 97 Weight(lbs): 111 Blood Pressure(mmHg): 154/81 Body Mass Index(BMI): 19.1 Temperature(F): 97.7 Respiratory Rate(breaths/min): 18 Photos: [N/Alejandra:N/Alejandra] Right, Lateral Foot N/Alejandra N/Alejandra Wound Location: Blister N/Alejandra N/Alejandra Wounding Event: Diabetic Wound/Ulcer of the Lower N/Alejandra N/Alejandra Primary Etiology: Extremity Cataracts, Glaucoma, Hypertension, N/Alejandra N/Alejandra Comorbid History: Type II Diabetes, Dementia 11/30/2021 N/Alejandra N/Alejandra Date Acquired: 3 N/Alejandra N/Alejandra Weeks of Treatment: Open N/Alejandra N/Alejandra Wound Status: No N/Alejandra N/Alejandra Wound Recurrence: 0x0x0 N/Alejandra N/Alejandra Measurements L x W x D (cm) 0 N/Alejandra N/Alejandra Alejandra (cm) : rea 0 N/Alejandra N/Alejandra Volume (cm) : 100.00% N/Alejandra N/Alejandra % Reduction in Alejandra rea: 100.00% N/Alejandra N/Alejandra % Reduction in Volume: Grade 1 N/Alejandra N/Alejandra Classification: None Present N/Alejandra N/Alejandra Exudate Alejandra mount: Flat and Intact N/Alejandra N/Alejandra Wound Margin: None Present (0%) N/Alejandra N/Alejandra Granulation Alejandra mount: None Present (0%)  N/Alejandra N/Alejandra Necrotic Alejandra mount: Fascia: No N/Alejandra N/Alejandra Exposed Structures: Fat Layer (Subcutaneous Tissue): No Tendon: No Muscle: No Joint: No Bone: No Limited to Skin Breakdown Large (67-100%) N/Alejandra N/Alejandra Epithelialization: Treatment Notes Electronic Signature(s) Signed: 01/29/2022 4:50:25 PM By: Dellia Nims,  Legrand Como MD Signed: 01/29/2022 6:25:59 PM By: Baruch Gouty RN, BSN Entered By: Linton Ham on 01/29/2022 10:10:12 -------------------------------------------------------------------------------- Multi-Disciplinary Care Plan Details Patient Name: Date of Service: Alejandra Davis, [name] 01/29/2022 9:30 Alejandra M Medical Record Number: [id number] Patient Account Number: [id number] Date of Birth/Sex: Treating RN: December 28, 1936 (86 y.o. [name] Primary Care Briseyda Fehr: Faustino Congress Other Clinician: Referring Emilyanne Mcgough: Treating Shaylon Gillean/Extender: Gardiner Ramus in Treatment: 3 Multidisciplinary Care Plan reviewed with physician Active Inactive Electronic Signature(s) Signed: 01/29/2022 6:25:59 PM By: Baruch Gouty RN, BSN Entered By: Baruch Gouty on 01/29/2022 09:49:13 -------------------------------------------------------------------------------- Pain Assessment Details Patient Name: Date of Service: Alejandra Davis, [name] 01/29/2022 9:30 Alejandra M Medical Record Number: [id number] Patient Account Number: [id number] Date of Birth/Sex: Treating RN: [birth year] (86 y.o. [name] Primary Care Davidmichael Zarazua: Faustino Congress Other Clinician: Referring Hikari Tripp: Treating Ketzia Guzek/Extender: Gardiner Ramus in Treatment: 3 Active Problems Location of Pain Severity and Description of Pain Patient Has Paino No Site Locations Rate the pain. Current Pain Level: 0 Pain Management and Medication Current Pain Management: Electronic Signature(s) Signed: 01/29/2022 6:25:59 PM By: Baruch Gouty RN, BSN Entered By: Baruch Gouty on  01/29/2022 09:42:45 -------------------------------------------------------------------------------- Patient/Caregiver Education Details Patient Name: Date of Service: Alejandra Davis, Alejandra Davis 2/2/2023andnbsp9:30 Alejandra M Medical Record Number: [id number] Patient Account Number: [id number] Date of Birth/Gender: Treating RN: 03/26/1936 (85 y.o. [name] Primary Care Physician: Faustino Congress Other Clinician: Referring Physician: Treating Physician/Extender: Gardiner Ramus in Treatment: 3 Education Assessment Education Provided To: Patient Education Topics Provided Wound/Skin Impairment: Methods: Explain/Verbal Responses: Reinforcements needed, State content correctly Electronic Signature(s) Signed: 01/29/2022 6:25:59 PM By: Baruch Gouty RN, BSN Entered By: Baruch Gouty on 01/29/2022 09:50:06 -------------------------------------------------------------------------------- Wound Assessment Details Patient Name: Date of Service: Alejandra Davis, [name] 01/29/2022 9:30 Alejandra M Medical Record Number: [id number] Patient Account Number: [id number] Date of Birth/Sex: Treating RN: 1936/06/09 (86 y.o. [name] Primary Care Nile Dorning: Faustino Congress Other Clinician: Referring Akylah Hascall: Treating Ulysee Fyock/Extender: Gardiner Ramus in Treatment: 3 Wound Status Wound Number: 1 Primary Diabetic Wound/Ulcer of the Lower Extremity Etiology: Wound Location: Right, Lateral Foot Wound Status: Open Wounding Event: Blister Comorbid Cataracts, Glaucoma, Hypertension, Type II Diabetes, Date Acquired: 11/30/2021 History: Dementia Weeks Of Treatment: 3 Clustered Wound: No Photos Wound Measurements Length: (cm) Width: (cm) Depth: (cm) Area: (cm) Volume: (cm) 0 % Reduction in Area: 100% 0 % Reduction in Volume: 100% 0 Epithelialization: Large (67-100%) 0 Tunneling: No 0 Undermining: No Wound Description Classification:  Grade 1 Wound Margin: Flat and Intact Exudate Amount: None Present Foul Odor After Cleansing: No Slough/Fibrino No Wound Bed Granulation Amount: None Present (0%) Exposed Structure Necrotic Amount: None Present (0%) Fascia Exposed: No Fat Layer (Subcutaneous Tissue) Exposed: No Tendon Exposed: No Muscle Exposed: No Joint Exposed: No Bone Exposed: No Limited to Skin Breakdown Electronic Signature(s) Signed: 01/29/2022 1:34:24 PM By: Sandre Kitty Signed: 01/29/2022 6:25:59 PM By: Baruch Gouty RN, BSN Entered By: Sandre Kitty on 01/29/2022 09:48:54 -------------------------------------------------------------------------------- [place] Details Patient Name: Date of Service: Alejandra Davis, [name] 01/29/2022 9:30 Alejandra M Medical Record Number: [id number] Patient Account Number: [id number] Date of Birth/Sex: Treating RN: 11-23-1936 (86 y.o. [name] Primary Care Renne Platts: Faustino Congress Other Clinician: Referring Shandee Jergens: Treating Marisabel Macpherson/Extender: Gardiner Ramus in Treatment: 3 Vital Signs Time Taken: 09:41 Temperature (F): 97.7 Height (in): 64 Pulse (bpm): 97 Source: Stated Respiratory Rate (breaths/min): 18 Weight (lbs): 111 Blood Pressure (mmHg): 154/81  Source: Stated Reference Range: 80 - 120 mg / dl Body Mass Index (BMI): 19.1 Electronic Signature(s) Signed: 01/29/2022 6:25:59 PM By: Baruch Gouty RN, BSN Entered By: Baruch Gouty on 01/29/2022 09:42:21

## 2022-01-29 NOTE — Progress Notes (Signed)
[name], [name] ([id number]) Visit Report for 01/29/2022 HPI Details Patient Name: Date of Service: [name], [name] 01/29/2022 9:30 A M Medical Record Number: [id number] Patient Account Number: [id number] Date of Birth/Sex: Treating RN: 1936-04-19 (86 y.o. Alejandra Davis Primary Care Provider: Faustino Davis Other Clinician: Referring Provider: Treating Provider/Extender: Alejandra Davis in Treatment: 3 History of Present Illness HPI Description: ADMISSION 01/08/2022 This is a 86 year old woman who was accompanied by her caregiver. She is apparently independent however has some cognitive issues. She is not a good historian. She was seen in the urgent care on 12/01/2021 with blisters on the lateral aspect of both feet felt to be secondary to venous stasis. Her caregiver had a single picture that showed a large blister on the left anterior lower leg as well as the right lateral heel and foot and left foot. There was some edema. She was apparently given a course of antibiotics/Cephalexin. She was later seen by primary care at [facility]. She was referred here I think after receiving topical steroids to the area. She arrives in today with almost everything healed including the left anterior lower leg left lateral foot large area on the right lateral heel and right lateral foot. The only area that is open is a small area on the right lateral foot that is still weeping edema fluid. Past medical history includes type 2 diabetes (diet controlled), thoracic aortic aneurysm followed by Dr. Doren Davis, hypothyroidism, cataract surgery. ABIs in our clinic were 1.13 on the right and 1.15 on the left 01/15/2022; the patient comes in with 2 new blisters 1 on the right lateral heel and one on the left anterior tibia lower aspect. Both of these are reasonably firm blisters Nikolsky sign is negative. There is no evidence of surrounding infection 2/2; all her blisters are epithelialized  today. He has 2 raised areas on the right lateral calf that are not blisters but feel like a series of small nodules. There is nothing open here either. Electronic Signature(s) Signed: 01/29/2022 4:50:25 PM By: Alejandra Ham MD Entered By: Alejandra Davis on 01/29/2022 10:10:59 -------------------------------------------------------------------------------- Physical Exam Details Patient Name: Date of Service: [name], [name] 01/29/2022 9:30 A M Medical Record Number: [id number] Patient Account Number: [id number] Date of Birth/Sex: Treating RN: 02-25-1936 (86 y.o. Alejandra Davis Primary Care Provider: Faustino Davis Other Clinician: Referring Provider: Treating Provider/Extender: Alejandra Davis in Treatment: 3 Constitutional Patient is hypertensive.. Pulse regular and within target range for patient.Marland Kitchen Respirations regular, non-labored and within target range.. Temperature is normal and within the target range for the patient.Marland Kitchen Appears in no distress. Notes Wound exam; on the right lateral heel everything is closed left anterior lower mid tibia this is also epithelialized. I see no fresh blistering Electronic Signature(s) Signed: 01/29/2022 4:50:25 PM By: Alejandra Ham MD Entered By: Alejandra Davis on 01/29/2022 10:14:05 -------------------------------------------------------------------------------- Physician Orders Details Patient Name: Date of Service: [name], [name] 01/29/2022 9:30 A M Medical Record Number: [id number] Patient Account Number: [id number] Date of Birth/Sex: Treating RN: 03-Sep-1936 (86 y.o. Alejandra Davis Primary Care Provider: Faustino Davis Other Clinician: Referring Provider: Treating Provider/Extender: Alejandra Davis in Treatment: 3 Verbal / Phone Orders: No Diagnosis Coding Discharge From [facility] Services Discharge from [place] - follow up with Dr. Denna Davis, try to keep any new  blisters intact Edema Control - Lymphedema / SCD / Other Elevate legs to the level of the heart or above for 30 minutes  daily and/or when sitting, a frequency of: - whenever sitting throughout the day Avoid standing for long periods of time. Exercise regularly Moisturize legs daily. Non Wound Condition Other Non Wound Condition Orders/Instructions: - silver aglinate secured with a bordered gauze or bandaid to any blistered areas Electronic Signature(s) Signed: 01/29/2022 4:50:25 PM By: Alejandra Ham MD Signed: 01/29/2022 6:25:59 PM By: Alejandra Gouty RN, BSN Entered By: Alejandra Davis on 01/29/2022 10:09:19 -------------------------------------------------------------------------------- Problem List Details Patient Name: Date of Service: [name], [name] 01/29/2022 9:30 A M Medical Record Number: [id number] Patient Account Number: [id number] Date of Birth/Sex: Treating RN: 1936-09-10 (86 y.o. Alejandra Davis Primary Care Provider: Faustino Davis Other Clinician: Referring Provider: Treating Provider/Extender: Alejandra Davis in Treatment: 3 Active Problems ICD-10 Encounter Code Description Active Date MDM Diagnosis L97.411 Non-pressure chronic ulcer of right heel and midfoot limited to breakdown of 01/08/2022 No Yes skin S90.821D Blister (nonthermal), right foot, subsequent encounter 01/08/2022 No Yes S90.822D Blister (nonthermal), left foot, subsequent encounter 01/08/2022 No Yes S80.822D Blister (nonthermal), left lower leg, subsequent encounter 01/08/2022 No Yes E11.621 Type 2 diabetes mellitus with foot ulcer 01/08/2022 No Yes Inactive Problems Resolved Problems Electronic Signature(s) Signed: 01/29/2022 4:50:25 PM By: Alejandra Ham MD Entered By: Alejandra Davis on 01/29/2022 10:09:55 -------------------------------------------------------------------------------- Progress Note Details Patient Name: Date of Service: [name], [name]  01/29/2022 9:30 A M Medical Record Number: [id number] Patient Account Number: [id number] Date of Birth/Sex: Treating RN: [birth year] (86 y.o. Alejandra Davis Primary Care Provider: Faustino Davis Other Clinician: Referring Provider: Treating Provider/Extender: Alejandra Davis in Treatment: 3 Subjective History of Present Illness (HPI) ADMISSION 01/08/2022 This is a 86 year old woman who was accompanied by her caregiver. She is apparently independent however has some cognitive issues. She is not a good historian. She was seen in the urgent care on 12/01/2021 with blisters on the lateral aspect of both feet felt to be secondary to venous stasis. Her caregiver had a single picture that showed a large blister on the left anterior lower leg as well as the right lateral heel and foot and left foot. There was some edema. She was apparently given a course of antibiotics/Cephalexin. She was later seen by primary care at [facility]. She was referred here I think after receiving topical steroids to the area. She arrives in today with almost everything healed including the left anterior lower leg left lateral foot large area on the right lateral heel and right lateral foot. The only area that is open is a small area on the right lateral foot that is still weeping edema fluid. Past medical history includes type 2 diabetes (diet controlled), thoracic aortic aneurysm followed by Dr. Doren Davis, hypothyroidism, cataract surgery. ABIs in our clinic were 1.13 on the right and 1.15 on the left 01/15/2022; the patient comes in with 2 new blisters 1 on the right lateral heel and one on the left anterior tibia lower aspect. Both of these are reasonably firm blisters Nikolsky sign is negative. There is no evidence of surrounding infection 2/2; all her blisters are epithelialized today. He has 2 raised areas on the right lateral calf that are not blisters but feel like a series of small nodules.  There is nothing open here either. Objective Constitutional Patient is hypertensive.. Pulse regular and within target range for patient.Marland Kitchen Respirations regular, non-labored and within target range.. Temperature is normal and within the target range for the patient.Marland Kitchen Appears in no distress. Vitals Time Taken: 9:41 AM, Height: 64  in, Source: Stated, Weight: 111 lbs, Source: Stated, BMI: 19.1, Temperature: 97.7 F, Pulse: 97 bpm, Respiratory Rate: 18 breaths/min, Blood Pressure: 154/81 mmHg. General Notes: Wound exam; on the right lateral heel everything is closed left anterior lower mid tibia this is also epithelialized. I see no fresh blistering Integumentary (Hair, Skin) Wound #1 status is Open. Original cause of wound was Blister. The date acquired was: 11/30/2021. The wound has been in treatment 3 weeks. The wound is located on the Right,Lateral Foot. The wound measures 0cm length x 0cm width x 0cm depth; 0cm^2 area and 0cm^3 volume. The wound is limited to skin breakdown. There is no tunneling or undermining noted. There is a none present amount of drainage noted. The wound margin is flat and intact. There is no granulation within the wound bed. There is no necrotic tissue within the wound bed. Assessment Active Problems ICD-10 Non-pressure chronic ulcer of right heel and midfoot limited to breakdown of skin Blister (nonthermal), right foot, subsequent encounter Blister (nonthermal), left foot, subsequent encounter Blister (nonthermal), left lower leg, subsequent encounter Type 2 diabetes mellitus with foot ulcer Plan Discharge From [facility] Services: Discharge from [place] - follow up with Dr. Denna Davis, try to keep any new blisters intact Edema Control - Lymphedema / SCD / Other: Elevate legs to the level of the heart or above for 30 minutes daily and/or when sitting, a frequency of: - whenever sitting throughout the day Avoid standing for long periods of time. Exercise  regularly Moisturize legs daily. Non Wound Condition: Other Non Wound Condition Orders/Instructions: - silver aglinate secured with a bordered gauze or bandaid to any blistered areas 1. Patient has an appointment with Dr. Carrolyn Meiers of dermatology on 2/20's. He does not have any fresh blisters and I am really uncertain about the etiology of this. 2. I think the patient can be discharged from our clinic I do not think this is a "wound care" issue Electronic Signature(s) Signed: 01/29/2022 4:50:25 PM By: Alejandra Ham MD Entered By: Alejandra Davis on 01/29/2022 10:15:43 -------------------------------------------------------------------------------- SuperBill Details Patient Name: Date of Service: DA Judeen Hammans, [name] 01/29/2022 Medical Record Number: [id number] Patient Account Number: [id number] Date of Birth/Sex: Treating RN: Sep 18, 1936 (86 y.o. Alejandra Davis Primary Care Provider: Faustino Davis Other Clinician: Referring Provider: Treating Provider/Extender: Alejandra Davis in Treatment: 3 Diagnosis Coding ICD-10 Codes Code Description [phone] Non-pressure chronic ulcer of right heel and midfoot limited to breakdown of skin S90.821D Blister (nonthermal), right foot, subsequent encounter S90.822D Blister (nonthermal), left foot, subsequent encounter S80.822D Blister (nonthermal), left lower leg, subsequent encounter E11.621 Type 2 diabetes mellitus with foot ulcer Facility Procedures CPT4 Code: [id number] Description: 99213 - WOUND CARE VISIT-LEV 3 EST PT Modifier: Quantity: 1 Physician Procedures : CPT4 Code Description Modifier [id number] - WC PHYS LEVEL 2 - EST PT ICD-10 Diagnosis Description L97.411 Non-pressure chronic ulcer of right heel and midfoot limited to breakdown of skin S90.821D Blister (nonthermal), right foot, subsequent  encounter Quantity: 1 Electronic Signature(s) Signed: 01/29/2022 4:50:25 PM By: Alejandra Ham  MD Entered By: Alejandra Davis on 01/29/2022 10:16:00

## 2022-02-05 NOTE — Progress Notes (Addendum)
[facility] Note  02/10/2022     CHIEF COMPLAINT Patient presents for Retina Follow Up  HISTORY OF PRESENT ILLNESS: Alejandra Davis is a 86 y.o. female who presents to the clinic today for:  HPI     Retina Follow Up   Patient presents with  CRVO/BRVO.  In right eye.  Severity is moderate.  Duration of 6 weeks.  Since onset it is stable.  I, the attending physician,  performed the HPI with the patient and updated documentation appropriately.        Comments   Patient states vision the same OU.      Last edited by [name], MD on 02/11/2022  8:40 AM.     Patient states vision the same OU.  Referring physician: Faustino Congress, NP [place],  [place] [id number]  HISTORICAL INFORMATION:   Selected notes from the MEDICAL RECORD NUMBER Referred by [name] for DM exam   CURRENT MEDICATIONS: Current Outpatient Medications (Ophthalmic Drugs)  Medication Sig   dorzolamide-timolol (COSOPT) 22.3-6.8 MG/ML ophthalmic solution Place 1 drop into both eyes 2 (two) times daily.   No current facility-administered medications for this visit. (Ophthalmic Drugs)   Current Outpatient Medications (Other)  Medication Sig   amLODipine (NORVASC) 10 MG tablet TAKE 1 TABLET(10 MG) BY MOUTH DAILY   ASPIRIN LOW DOSE 81 MG EC tablet Take 81 mg by mouth daily.   Lancets Ultra Thin 30G MISC 100 each by Does not apply route 3 (three) times a week.   levothyroxine (SYNTHROID) 75 MCG tablet Take 1 tablet (75 mcg total) by mouth daily.   lisinopril (ZESTRIL) 2.5 MG tablet Take 1 tablet (2.5 mg total) by mouth daily.   loratadine (CLARITIN) 10 MG tablet TAKE 1 TABLET(10 MG) BY MOUTH DAILY   melatonin 3 MG TABS tablet Take 1 tablet (3 mg total) by mouth at bedtime. May repeat x 1 dose if still unable to sleep   metoprolol tartrate (LOPRESSOR) 50 MG tablet TAKE 1 TABLET(50 MG) BY MOUTH DAILY   triamcinolone ointment (KENALOG) 0.5 % APPLY TOPICALLY  TO THE AFFECTED AREA TWICE DAILY FOR 14 DAYS   No current facility-administered medications for this visit. (Other)   REVIEW OF SYSTEMS: ROS   Positive for: Skin, Genitourinary, Musculoskeletal, Endocrine, Cardiovascular, Eyes Negative for: Constitutional, Gastrointestinal, Neurological, HENT, Respiratory, Psychiatric, Allergic/Imm, Heme/Lymph Last edited by Roselee Nova D, COT on 02/10/2022  9:47 AM.      ALLERGIES Allergies  Allergen Reactions   Donepezil Hcl     Stomach cramps    PAST MEDICAL HISTORY Past Medical History:  Diagnosis Date   Chicken pox    Cystocele    Diabetes mellitus without complication ([place])    Glaucoma    Hypertension    Hypertensive retinopathy    OU   Neuromuscular disorder ([place])    Dementia    Thyroid disease    hypothyroidism   Past Surgical History:  Procedure Laterality Date   ABDOMINAL HYSTERECTOMY  1990   TAH BSO   CATARACT EXTRACTION Bilateral    Dr. [name]   COLONOSCOPY WITH PROPOFOL N/A 04/08/2021   Procedure: COLONOSCOPY WITH PROPOFOL;  Surgeon: [name], MD;  Location: [facility];  Service: Endoscopy;  Laterality: N/A;   EYE SURGERY Bilateral    Cat Sx OU   OOPHORECTOMY     BSO   Vaginal Bx     Papilloma    FAMILY HISTORY Family History  Problem  Relation Age of Onset   Sickle cell anemia Other    Hypertension Mother    Cancer Father        LIVER   Heart disease Brother    Cancer Brother        STOMACH   SOCIAL HISTORY Social History   Tobacco Use   Smoking status: Never   Smokeless tobacco: Never  Vaping Use   Vaping Use: Never used  Substance Use Topics   Alcohol use: No   Drug use: No       OPHTHALMIC EXAM: Base Eye Exam     Visual Acuity (Snellen - Linear)       Right Left   Dist cc 20/150 -3 20/40 -2   Dist ph cc NI 20/30 -2         Tonometry (Tonopen, 9:56 AM)       Right Left   Pressure 15 17         Pupils       Dark Light Shape React APD   Right 3 2 Round Minimal  None   Left 3 2 Round Minimal None         Visual Fields (Counting fingers)       Left Right    Full Full         Extraocular Movement       Right Left    Full, Ortho Full, Ortho         Neuro/Psych     Oriented x3: Yes   Mood/Affect: Normal         Dilation     Both eyes: 1.0% Mydriacyl, 2.5% Phenylephrine @ 9:56 AM           Slit Lamp and Fundus Exam     Slit Lamp Exam       Right Left   Lids/Lashes Dermatochalasis - upper lid, Meibomian gland dysfunction Dermatochalasis - upper lid, Meibomian gland dysfunction   Conjunctiva/Sclera Melanosis Melanosis   Cornea Arcus, 1+ Punctate epithelial erosions, tear film debris, 1+ Guttata Arcus, Inferior 1+ Punctate epithelial erosions, well healed cataract wound   Anterior Chamber Deep and quiet Deep;  no cell or flare   Iris Round and dilated, No NVI Round and dilated, No NVI   Lens PC IOL in good position, 2+ Posterior capsular opacification Posterior chamber intraocular lens in good position, 1-2+ PCO.   Anterior Vitreous Vitreous syneresis Vitreous syneresis         Fundus Exam       Right Left   Disc Sharp rim, inferior notch, 3+ pallor, inf. Rim thinning, attenuated vessels superiorly, +disc heme at 1100, fine vascular loops superiorly 2+ pallor, Sharp rim, thin inferior rim   C/D Ratio 0.85 0.6   Macula Blunted foveal reflex, severe edema superior mac-- stably improved; stable improvement in DBH superior mac, central exudates Flat, good foveal reflex, mild RPE mottling and clumping, Epiretinal membrane, No heme or edema   Vessels superior BRVO with severe attenuation of ST arterioles, Tortuous attenuated, mild tortuousity   Periphery Attached, DBH superior hemishphere extended from BRVO--improved Attached, mild, scattered Reticular degeneration, No heme           Refraction     Wearing Rx       Sphere Cylinder Axis Add   Right -0.75 +1.25 170 +2.50   Left -0.75 +1.00 003 +2.50    Type: PAL          Wearing Rx #2  Sphere Cylinder Axis Add   Right -0.75 +1.25 170 +2.50   Left -0.75 +1.00 003 +2.50    Type: PAL            IMAGING AND PROCEDURES  Imaging and Procedures for 03/07/18  OCT, Retina - OU - Both Eyes       Right Eye Quality was good. Central Foveal Thickness: 248. Progression has been stable. Findings include epiretinal membrane, abnormal foveal contour, no SRF, outer retinal atrophy, subretinal hyper-reflective material, intraretinal hyper-reflective material, no IRF (Persistent trace cystic changes, central SRHM and patchy ORA ).   Left Eye Quality was good. Central Foveal Thickness: 257. Progression has been stable. Findings include abnormal foveal contour, epiretinal membrane, no IRF, no SRF, vitreomacular adhesion (Blunted foveal depression--stable).   Notes *Images captured and stored on drive  Diagnosis / Impression:  ERM OU OD: BRVO w/ stable improvement in CME and foveal contour; just trace cystic changes remain; central SRHM and patchy ORA OS: very mild ERM with blunted foveal depression -- stable; mild VMA  Clinical management:  See below  Abbreviations: NFP - Normal foveal profile. CME - cystoid macular edema. PED - pigment epithelial detachment. IRF - intraretinal fluid. SRF - subretinal fluid. EZ - ellipsoid zone. ERM - epiretinal membrane. ORA - outer retinal atrophy. ORT - outer retinal tubulation. SRHM - subretinal hyper-reflective material       Intravitreal Injection, Pharmacologic Agent - OD - Right Eye       Time Out 02/10/2022. 10:43 AM. Confirmed correct patient, procedure, site, and patient consented.   Anesthesia Topical anesthesia was used. Anesthetic medications included Lidocaine 2%, Proparacaine 0.5%.   Procedure Preparation included 5% betadine to ocular surface, eyelid speculum. A (32g) needle was used.   Injection: 1.25 mg Bevacizumab 1.49m/0.05ml   Route: Intravitreal, Site: Right Eye   NDC:  50242-060-01, Lot: [id number], Expiration date: 03/09/2022   Post-op Post injection exam found visual acuity of at least counting fingers. The patient tolerated the procedure well. There were no complications. The patient received written and verbal post procedure care education. Post injection medications were not given.             ASSESSMENT/PLAN:   ICD-10-CM   1. Branch retinal vein occlusion of right eye with macular edema  H34.8310 OCT, Retina - OU - Both Eyes    Intravitreal Injection, Pharmacologic Agent - OD - Right Eye    Bevacizumab (AVASTIN) SOLN 1.25 mg    2. Diabetes mellitus type 2 without retinopathy ([place]  E11.9     3. Epiretinal membrane (ERM) of both eyes  H35.373     4. Pseudophakia of both eyes  Z96.1     5. Ocular hypertension of right eye  H40.051     6. Glaucoma suspect of both eyes  H40.003     7. Essential hypertension  I10     8. Hypertensive retinopathy of both eyes  H35.033       1. BRVO with CME OD  - lost to follow up from 02.22.22 to 11.1.22 -- re-presented with massive CME and BCVA 20/400 from 20/30  - initial OCT w/ CME superior macula  - s/p IVA OD #1 (10.18.19), #2 (11.19.19), #3 (12.17.19), #4 (02.19.20), #5 (03.23.20), #6 (04.21.20), #7 (05.27.20), #8 (07.07.20), #9 (08.25.20), #10 (9.29.20), #11 (11.17.20), #12 (12.22.20), #13 (01.27.21), #14 (03.10.21), #15 (04.21.21), #16 (05.26.21), #17 (06.30.21), #18 (08.04.21), #19 (09.01.21), #20 (10.06.21), #21 (11.17.21), #22 (12.30.21), #23 (2.22.22), #24 (11.1.22), #25 (11.29.22), #26 (01.03.23)  -  good response to medications, but held IVA in January 2020 due to cataract surgery  - today, BCVA OD 20/150 from 20/200 -- improvement limited by central atrophy  - OCT shows stable improvement in CME and foveal contour; just trace cystic changes remain; central SRHM and patchy ORA at 6 wks   - recommend IVA OD #27 today, 02.14.23 w f/u in 6 wks  - pt wishes to proceed  - RBA of procedure discussed,  questions answered  - informed consent obtained   - Avastin informed consent form signed and scanned on 01.27.2021  - see procedure note  - history of interval increase in IRF/cystic changes at 6+ weeks interval  - f/u 6 weeks -- DFE/OCT, possible injection   2. Diabetes mellitus, type 2 without retinopathy  - The incidence, risk factors for progression, natural history and treatment options for diabetic retinopathy  were discussed with patient.     - The need for close monitoring of blood glucose, blood pressure, and serum lipids, avoiding cigarette or any type of tobacco, and the need for long term follow up was also discussed with patient.   3. Epiretinal membrane, OU  - relatively mild ERM OU -- blunted central foveal depression  - OD with mild cystic changes 2/2 BRVO as above; OS without cystic changes or edema  - discussed findings and prognosis   - monitor  4. Pseudophakia OU  - s/p CE/IOL OS 11.13.19 w/ Dr. [name]  - s/p CE/IOL OD 01.16.20 w/ Dr. [name]  - beautiful surgeries -- IOLs in perfect position  - healing well post-operatively  - IRF/CME OD may have been partly due to post op CME ([place]) in addition to BRVO  5,6. Ocular hypertension / Glaucoma suspect OD>OS  - IOP ~25 OD, 20 OS at prior clinic visits  - today IOP 15,17  - denies any family hx of glaucoma  - uses Cosopt BID OU  7,8. Hypertensive retinopathy OU  - pt presented to 6.24.2020 visit urgently for right sided headache above right eye with extension to occiput  - BP at that time was 201/100 -- pt was sent to primary care clinic for urgent evaluation and meds were adjusted  - BP now under better control and headaches improved  - BP reading 11.1.22 -- 180/84 -- advised f/u with PCP  - discussed importance of tight BP control  Ophthalmic Meds Ordered this visit:  Meds ordered this encounter  Medications   Bevacizumab (AVASTIN) SOLN 1.25 mg     Return in about 6 weeks (around 03/24/2022) for DFE,  OCT.  There are no Patient Instructions on file for this visit.  This document serves as a record of services personally performed by Gardiner Sleeper, MD, PhD. It was created on their behalf by San Jetty. Owens Shark, OA an ophthalmic technician. The creation of this record is the provider's dictation and/or activities during the visit.    Electronically signed by: San Jetty. Owens Shark, [place] 02.09.2023 4:58 PM  Gardiner Sleeper, M.D., Ph.D. Diseases & Surgery of the Retina and Vitreous [facility]  I have reviewed the above documentation for accuracy and completeness, and I agree with the above. Gardiner Sleeper, M.D., Ph.D. 02/11/22 4:58 PM   Abbreviations: M myopia (nearsighted); A astigmatism; H hyperopia (farsighted); P presbyopia; Mrx spectacle prescription;  CTL contact lenses; OD right eye; OS left eye; OU both eyes  XT exotropia; ET esotropia; PEK punctate epithelial keratitis; PEE punctate epithelial erosions; DES dry eye  syndrome; MGD meibomian gland dysfunction; ATs artificial tears; PFAT's preservative free artificial tears; [place] nuclear sclerotic cataract; PSC posterior subcapsular cataract; ERM epi-retinal membrane; PVD posterior vitreous detachment; RD retinal detachment; DM diabetes mellitus; DR diabetic retinopathy; NPDR non-proliferative diabetic retinopathy; PDR proliferative diabetic retinopathy; CSME clinically significant macular edema; DME diabetic macular edema; dbh dot blot hemorrhages; CWS cotton wool spot; POAG primary open angle glaucoma; C/D cup-to-disc ratio; HVF humphrey visual field; GVF goldmann visual field; OCT optical coherence tomography; IOP intraocular pressure; BRVO Branch retinal vein occlusion; CRVO central retinal vein occlusion; CRAO central retinal artery occlusion; BRAO branch retinal artery occlusion; RT retinal tear; SB scleral buckle; PPV pars plana vitrectomy; VH Vitreous hemorrhage; PRP panretinal laser photocoagulation; IVK intravitreal  kenalog; VMT vitreomacular traction; MH Macular hole;  NVD neovascularization of the disc; NVE neovascularization elsewhere; AREDS age related eye disease study; ARMD age related macular degeneration; POAG primary open angle glaucoma; EBMD epithelial/anterior basement membrane dystrophy; ACIOL anterior chamber intraocular lens; IOL intraocular lens; PCIOL posterior chamber intraocular lens; Phaco/IOL phacoemulsification with intraocular lens placement; [place] photorefractive keratectomy; LASIK laser assisted in situ keratomileusis; HTN hypertension; DM diabetes mellitus; COPD chronic obstructive pulmonary disease

## 2022-02-10 ENCOUNTER — Other Ambulatory Visit: Payer: Self-pay

## 2022-02-10 ENCOUNTER — Encounter (INDEPENDENT_AMBULATORY_CARE_PROVIDER_SITE_OTHER): Payer: Self-pay | Admitting: Ophthalmology

## 2022-02-10 ENCOUNTER — Ambulatory Visit (INDEPENDENT_AMBULATORY_CARE_PROVIDER_SITE_OTHER): Payer: Medicare PPO | Admitting: Ophthalmology

## 2022-02-10 DIAGNOSIS — I10 Essential (primary) hypertension: Secondary | ICD-10-CM

## 2022-02-10 DIAGNOSIS — H35373 Puckering of macula, bilateral: Secondary | ICD-10-CM

## 2022-02-10 DIAGNOSIS — H40003 Preglaucoma, unspecified, bilateral: Secondary | ICD-10-CM | POA: Diagnosis not present

## 2022-02-10 DIAGNOSIS — Z961 Presence of intraocular lens: Secondary | ICD-10-CM

## 2022-02-10 DIAGNOSIS — H34831 Tributary (branch) retinal vein occlusion, right eye, with macular edema: Secondary | ICD-10-CM

## 2022-02-10 DIAGNOSIS — H40051 Ocular hypertension, right eye: Secondary | ICD-10-CM | POA: Diagnosis not present

## 2022-02-10 DIAGNOSIS — H35033 Hypertensive retinopathy, bilateral: Secondary | ICD-10-CM | POA: Diagnosis not present

## 2022-02-10 DIAGNOSIS — E119 Type 2 diabetes mellitus without complications: Secondary | ICD-10-CM

## 2022-02-11 ENCOUNTER — Encounter (INDEPENDENT_AMBULATORY_CARE_PROVIDER_SITE_OTHER): Payer: Self-pay | Admitting: Ophthalmology

## 2022-02-11 MED ORDER — BEVACIZUMAB CHEMO INJECTION 1.25MG/0.05ML SYRINGE FOR KALEIDOSCOPE
1.2500 mg | INTRAVITREAL | Status: AC | PRN
  Administered 2022-02-11: 1.25 mg via INTRAVITREAL

## 2022-02-13 ENCOUNTER — Ambulatory Visit: Payer: Medicare PPO | Admitting: Family

## 2022-02-16 ENCOUNTER — Ambulatory Visit: Payer: Medicare PPO | Admitting: Dermatology

## 2022-02-16 ENCOUNTER — Other Ambulatory Visit: Payer: Self-pay

## 2022-02-16 ENCOUNTER — Encounter: Payer: Self-pay | Admitting: Dermatology

## 2022-02-16 DIAGNOSIS — L815 Leukoderma, not elsewhere classified: Secondary | ICD-10-CM

## 2022-02-16 DIAGNOSIS — B353 Tinea pedis: Secondary | ICD-10-CM

## 2022-02-16 DIAGNOSIS — L089 Local infection of the skin and subcutaneous tissue, unspecified: Secondary | ICD-10-CM | POA: Diagnosis not present

## 2022-02-16 DIAGNOSIS — L821 Other seborrheic keratosis: Secondary | ICD-10-CM

## 2022-02-16 MED ORDER — MUPIROCIN 2 % EX OINT
1.0000 | TOPICAL_OINTMENT | Freq: Two times a day (BID) | CUTANEOUS | 1 refills | Status: DC
Start: 2022-02-16 — End: 2022-03-19

## 2022-02-17 ENCOUNTER — Encounter: Payer: Self-pay | Admitting: Dermatology

## 2022-02-17 NOTE — Progress Notes (Signed)
° °  New Patient   Subjective  Alejandra Davis is a 86 y.o. female who presents for the following: Skin Problem (Lesion on both legs x 2 months- itching.).  Recurring blisters lower legs Location:  Duration:  Quality:  Associated Signs/Symptoms: Modifying Factors:  Severity:  Timing: Context:    The following portions of the chart were reviewed this encounter and updated as appropriate:  Tobacco   Allergies   Meds   Problems   Med Hx   Surg Hx   Fam Hx       Objective  Well appearing patient in no apparent distress; mood and affect are within normal limits. Left Lower Leg - Anterior, Right Lower Leg - Anterior 2 dozen 2 to 3 mm hypopigmented macules  Left Lower Leg - Anterior, Right Lower Leg - Anterior Half dozen 4 mm textured brown papules  Right Foot - Anterior Patient was extremely pleasant, caretaker who was with her throughout visit did most communication.  Apparently has had a recurring blistering area on the right lateral foot, 1 blister on the left lower shin which left a discolored area, and a new blister on the proximal right dorsal third toe.  Although there is a possibility of infection like blistering dactylitis or tinea, I suspect this may represent localized bullous pemphigoid required epidermolysis bullosa.  We will initially culture the active blister and treat with topical mupirocin, but the next flare will probably prompt biopsies including direct immunofluorescence.         A focused examination was performed including arms and legs. Relevant physical exam findings are noted in the Assessment and Plan.   Assessment & Plan  Guttate hypomelanosis (2) Left Lower Leg - Anterior; Right Lower Leg - Anterior  No intervention indicated  Seborrheic keratosis (2) Left Lower Leg - Anterior; Right Lower Leg - Anterior  No treatment indicated  Blistering distal dactylitis Right Foot - Anterior  I&D posterior right dorsal foot, culture clear liquid.  Apply  mupirocin twice daily pending culture result.  mupirocin ointment (BACTROBAN) 2 % - Right Foot - Anterior Apply 1 application topically 2 (two) times daily.  Related Procedures Anaerobic and Aerobic Culture  Tinea pedis of left foot Left 2nd Lateral Paronychium of Toe  Tinea pedis of right foot Right 3rd Toe Nail Plate

## 2022-02-18 NOTE — Telephone Encounter (Signed)
I returned patient's daughter's phone call Elliot Dally) we discussed in some detail her mom's differential diagnosis.  We will look for bacterial or fungal infection first but this is unlikely.  Daughter told that this is more likely an immune mediated disorder like localized pemphigoid or acquired epidermolysis bullosa.  If the culture is negative, her caretaker will bring her in the next time she develops another blister and we may be able to obtain diagnostic biopsies.  I told daughter that once this information is available I will get back to her.

## 2022-02-22 LAB — ANAEROBIC AND AEROBIC CULTURE
AER RESULT:: NO GROWTH
MICRO NUMBER:: 13030971
MICRO NUMBER:: 13030972
SPECIMEN QUALITY:: ADEQUATE
SPECIMEN QUALITY:: ADEQUATE

## 2022-02-23 ENCOUNTER — Encounter: Payer: Self-pay | Admitting: Dermatology

## 2022-03-09 DIAGNOSIS — Z8616 Personal history of COVID-19: Secondary | ICD-10-CM | POA: Diagnosis not present

## 2022-03-09 DIAGNOSIS — R410 Disorientation, unspecified: Secondary | ICD-10-CM | POA: Diagnosis not present

## 2022-03-09 DIAGNOSIS — R5383 Other fatigue: Secondary | ICD-10-CM | POA: Diagnosis not present

## 2022-03-09 DIAGNOSIS — N3000 Acute cystitis without hematuria: Secondary | ICD-10-CM | POA: Diagnosis not present

## 2022-03-11 ENCOUNTER — Encounter (INDEPENDENT_AMBULATORY_CARE_PROVIDER_SITE_OTHER): Payer: Self-pay | Admitting: Ophthalmology

## 2022-03-12 ENCOUNTER — Ambulatory Visit (INDEPENDENT_AMBULATORY_CARE_PROVIDER_SITE_OTHER): Payer: Medicare PPO | Admitting: Ophthalmology

## 2022-03-12 ENCOUNTER — Other Ambulatory Visit: Payer: Self-pay

## 2022-03-12 ENCOUNTER — Encounter (INDEPENDENT_AMBULATORY_CARE_PROVIDER_SITE_OTHER): Payer: Self-pay | Admitting: Ophthalmology

## 2022-03-12 DIAGNOSIS — I10 Essential (primary) hypertension: Secondary | ICD-10-CM | POA: Diagnosis not present

## 2022-03-12 DIAGNOSIS — Z961 Presence of intraocular lens: Secondary | ICD-10-CM | POA: Diagnosis not present

## 2022-03-12 DIAGNOSIS — H40051 Ocular hypertension, right eye: Secondary | ICD-10-CM | POA: Diagnosis not present

## 2022-03-12 DIAGNOSIS — H35033 Hypertensive retinopathy, bilateral: Secondary | ICD-10-CM

## 2022-03-12 DIAGNOSIS — H40003 Preglaucoma, unspecified, bilateral: Secondary | ICD-10-CM

## 2022-03-12 DIAGNOSIS — H34831 Tributary (branch) retinal vein occlusion, right eye, with macular edema: Secondary | ICD-10-CM | POA: Diagnosis not present

## 2022-03-12 DIAGNOSIS — H35373 Puckering of macula, bilateral: Secondary | ICD-10-CM

## 2022-03-12 NOTE — Progress Notes (Signed)
?[facility] Note ? ?03/12/2022 ? ?  ? ?CHIEF COMPLAINT ?Patient presents for Retina Follow Up ? ?HISTORY OF PRESENT ILLNESS: ?Alejandra Davis is a 86 y.o. female who presents to the clinic today for:  ?HPI   ? ? Retina Follow Up   ?Patient presents with  CRVO/BRVO.  In right eye.  This started 1 day ago.  I, the attending physician,  performed the HPI with the patient and updated documentation appropriately. ? ?  ?  ? ? Comments   ?Patient here for 1 day retina follow up for  BVO OD. Patient states vision seems to be doing ok. OD ws hurting. Started yesterday. Had pain and pretty blurry. ? ?  ?  ?Last edited by [name], MD on 03/13/2022  9:56 PM.  ?  ?Patient states vision became blurry in OD, some pain yesterday. Not having issues today.  ? ?Referring physician: ?Faustino Congress, NP ?[address] ?[place],  [place] [address] ? ?HISTORICAL INFORMATION:  ? ?Selected notes from the [facility] ?Referred by [name] for DM exam  ? ?CURRENT MEDICATIONS: ?Current Outpatient Medications (Ophthalmic Drugs)  ?Medication Sig  ? dorzolamide-timolol (COSOPT) 22.3-6.8 MG/ML ophthalmic solution Place 1 drop into both eyes 2 (two) times daily.  ? ?No current facility-administered medications for this visit. (Ophthalmic Drugs)  ? ?Current Outpatient Medications (Other)  ?Medication Sig  ? amLODipine (NORVASC) 10 MG tablet TAKE 1 TABLET(10 MG) BY MOUTH DAILY  ? ASPIRIN LOW DOSE 81 MG EC tablet Take 81 mg by mouth daily.  ? Lancets Ultra Thin 30G MISC 100 each by Does not apply route 3 (three) times a week.  ? levothyroxine (SYNTHROID) 75 MCG tablet Take 1 tablet (75 mcg total) by mouth daily.  ? lisinopril (ZESTRIL) 2.5 MG tablet Take 1 tablet (2.5 mg total) by mouth daily.  ? loratadine (CLARITIN) 10 MG tablet TAKE 1 TABLET(10 MG) BY MOUTH DAILY  ? melatonin 3 MG TABS tablet Take 1 tablet (3 mg total) by mouth at bedtime. May repeat x 1 dose if still unable to sleep  ? metoprolol  tartrate (LOPRESSOR) 50 MG tablet TAKE 1 TABLET(50 MG) BY MOUTH DAILY  ? mupirocin ointment (BACTROBAN) 2 % Apply 1 application topically 2 (two) times daily.  ? triamcinolone ointment (KENALOG) 0.5 % APPLY TOPICALLY TO THE AFFECTED AREA TWICE DAILY FOR 14 DAYS  ? ?No current facility-administered medications for this visit. (Other)  ? ?REVIEW OF SYSTEMS: ?ROS   ?Positive for: Skin, Genitourinary, Musculoskeletal, Endocrine, Cardiovascular, Eyes ?Negative for: Constitutional, Gastrointestinal, Neurological, HENT, Respiratory, Psychiatric, Allergic/Imm, Heme/Lymph ?Last edited by [name], COA on 03/12/2022  9:38 AM.  ?  ? ?ALLERGIES ?Allergies  ?Allergen Reactions  ? Donepezil Hcl   ?  Stomach cramps   ? ?PAST MEDICAL HISTORY ?Past Medical History:  ?Diagnosis Date  ? Chicken pox   ? Cystocele   ? Diabetes mellitus without complication ([place])   ? Glaucoma   ? Hypertension   ? Hypertensive retinopathy   ? OU  ? Neuromuscular disorder ([place])   ? Dementia   ? Thyroid disease   ? hypothyroidism  ? ?Past Surgical History:  ?Procedure Laterality Date  ? ABDOMINAL HYSTERECTOMY  1990  ? TAH BSO  ? CATARACT EXTRACTION Bilateral   ? Dr. [name]  ? COLONOSCOPY WITH PROPOFOL N/A 04/08/2021  ? Procedure: COLONOSCOPY WITH PROPOFOL;  Surgeon: [name], MD;  Location: [facility];  Service: Endoscopy;  Laterality: N/A;  ?  EYE SURGERY Bilateral   ? Cat Sx OU  ? OOPHORECTOMY    ? BSO  ? Vaginal Bx    ? Papilloma  ? ?FAMILY HISTORY ?Family History  ?Problem Relation Age of Onset  ? Sickle cell anemia Other   ? Hypertension Mother   ? Cancer Father   ?     LIVER  ? Heart disease Brother   ? Cancer Brother   ?     STOMACH  ? ?SOCIAL HISTORY ?Social History  ? ?Tobacco Use  ? Smoking status: Never  ? Smokeless tobacco: Never  ?Vaping Use  ? Vaping Use: Never used  ?Substance Use Topics  ? Alcohol use: No  ? Drug use: No  ?  ? ?  ?OPHTHALMIC EXAM: ?Base Eye Exam   ? ? Visual Acuity (Snellen - Linear)   ? ?   Right Left  ?  Dist cc 20/200 -1 20/80 -2  ? Dist ph cc 20/150 -2 20/40 +2  ? ? Correction: Glasses  ? ?  ?  ? ? Tonometry (Tonopen, 9:34 AM)   ? ?   Right Left  ? Pressure 12 13  ? ?  ?  ? ? Pupils   ? ?   Dark Light Shape React APD  ? Right 3 2 Round Minimal None  ? Left 3 2 Round Minimal None  ? ?  ?  ? ? Visual Fields (Counting fingers)   ? ?   Left Right  ?  Full Full  ? ?  ?  ? ? Extraocular Movement   ? ?   Right Left  ?  Full, Ortho Full, Ortho  ? ?  ?  ? ? Neuro/Psych   ? ? Oriented x3: Yes  ? Mood/Affect: Normal  ? ?  ?  ? ? Dilation   ? ? Both eyes: 1.0% Mydriacyl, 2.5% Phenylephrine @ 9:34 AM  ? ?  ?  ? ?  ? ?Slit Lamp and Fundus Exam   ? ? Slit Lamp Exam   ? ?   Right Left  ? Lids/Lashes Dermatochalasis - upper lid, Meibomian gland dysfunction Dermatochalasis - upper lid, Meibomian gland dysfunction  ? Conjunctiva/Sclera Melanosis Melanosis  ? Cornea Arcus, 1-2+ Punctate epithelial erosions, tear film debris, 1+ Guttata Arcus, Inferior 1+ Punctate epithelial erosions, well healed cataract wound  ? Anterior Chamber Deep and quiet Deep;  no cell or flare  ? Iris Round and dilated, No NVI Round and dilated, No NVI  ? Lens PC IOL in good position, 2+ Posterior capsular opacification Posterior chamber intraocular lens in good position, 1-2+ PCO.  ? Anterior Vitreous Vitreous syneresis Vitreous syneresis  ? ?  ?  ? ? Fundus Exam   ? ?   Right Left  ? Disc Sharp rim, inferior notch, 3+ pallor, inf. Rim thinning, attenuated vessels superiorly, +disc heme at 1100--fading, fine vascular loops superiorly 2+ pallor, Sharp rim, thin inferior rim  ? C/D Ratio 0.85 0.75  ? Macula Blunted foveal reflex, severe edema superior mac-- stably improved; stable improvement in DBH superior mac, central exudates and fibrosis Flat, good foveal reflex, mild RPE mottling and clumping, Epiretinal membrane, No heme or edema  ? Vessels superior BRVO with severe attenuation of ST arterioles, Tortuous attenuated, mild tortuousity, + copper wiring  ?  Periphery Attached, DBH superior hemisphere extended from BRVO--improved Attached, mild, scattered Reticular degeneration, No heme  ? ?  ?  ? ?  ? ?Refraction   ? ? Wearing  Rx   ? ?   Sphere Cylinder Axis Add  ? Right -0.75 +1.25 170 +2.50  ? Left -0.75 +1.00 003 +2.50  ? ? Type: PAL  ? ?  ?  ? ? Wearing Rx #2   ? ?   Sphere Cylinder Axis Add  ? Right -0.75 +1.25 170 +2.50  ? Left -0.75 +1.00 003 +2.50  ? ? Type: PAL  ? ?  ?  ? ?  ? ? ?IMAGING AND PROCEDURES  ?Imaging and Procedures for 03/07/18 ? ?OCT, Retina - OU - Both Eyes   ? ?   ?Right Eye ?Quality was good. Central Foveal Thickness: 236. Progression has improved. Findings include epiretinal membrane, abnormal foveal contour, no SRF, outer retinal atrophy, subretinal hyper-reflective material, intraretinal hyper-reflective material, no IRF (Mild interval improvement in cystic changes, central SRHM and patchy ORA; diffuse retinal thinning--stable. ).  ? ?Left Eye ?Quality was good. Central Foveal Thickness: 253. Progression has been stable. Findings include abnormal foveal contour, epiretinal membrane, no IRF, no SRF, vitreomacular adhesion (Blunted foveal depression--stable).  ? ?Notes ?*Images captured and stored on drive ? ?Diagnosis / Impression:  ?ERM OU ?OD: Mild interval improvement in cystic changes, central SRHM and patchy ORA; diffuse retinal thinning--stable.  ?OS: very mild ERM with blunted foveal depression -- stable; mild VMA ? ?Clinical management:  ?See below ? ?Abbreviations: NFP - Normal foveal profile. CME - cystoid macular edema. PED - pigment epithelial detachment. IRF - intraretinal fluid. SRF - subretinal fluid. EZ - ellipsoid zone. ERM - epiretinal membrane. ORA - outer retinal atrophy. ORT - outer retinal tubulation. SRHM - subretinal hyper-reflective material ? ? ? ?  ? ? ?  ?  ? ?  ?ASSESSMENT/PLAN: ?  ICD-10-CM   ?1. Branch retinal vein occlusion of right eye with macular edema  H34.8310 OCT, Retina - OU - Both Eyes  ?  ?2. Diabetes  mellitus type 2 without retinopathy ([place])  E11.9   ?  ?3. Epiretinal membrane (ERM) of both eyes  H35.373   ?  ?4. Pseudophakia of both eyes  Z96.1   ?  ?5. Ocular hypertension of right eye  H40.051

## 2022-03-13 ENCOUNTER — Encounter (INDEPENDENT_AMBULATORY_CARE_PROVIDER_SITE_OTHER): Payer: Self-pay | Admitting: Ophthalmology

## 2022-03-19 ENCOUNTER — Ambulatory Visit: Payer: Medicare PPO | Admitting: Neurology

## 2022-03-19 ENCOUNTER — Encounter: Payer: Self-pay | Admitting: Neurology

## 2022-03-19 ENCOUNTER — Other Ambulatory Visit: Payer: Self-pay

## 2022-03-19 VITALS — BP 176/87 | HR 100 | Ht 63.0 in | Wt 118.6 lb

## 2022-03-19 DIAGNOSIS — F0280 Dementia in other diseases classified elsewhere without behavioral disturbance: Secondary | ICD-10-CM | POA: Diagnosis not present

## 2022-03-19 DIAGNOSIS — G301 Alzheimer's disease with late onset: Secondary | ICD-10-CM

## 2022-03-19 MED ORDER — MEMANTINE HCL ER 14 MG PO CP24: 14.0000 mg | ORAL_CAPSULE | Freq: Every day | ORAL | 3 refills | Status: DC

## 2022-03-19 NOTE — Progress Notes (Signed)
Subjective:  ?  ?Patient ID: Alejandra Davis is a 86 y.o. female. ? ?HPI ? ? ? ?Interim history:  ? ?Alejandra Davis is an 86 year old right-handed woman with an underlying medical history of diabetes, hypertension, branch retinal vein occlusion, macular edema, hearing loss, and hypothyroidism, who presents for follow-up consultation of Alejandra memory loss.  The patient is accompanied by Alejandra Davis, Alejandra Davis, today, and presents after a long gap of over 2 years.  I last saw Alejandra on 08/29/2019, at which time she was on Aricept 5 mg daily, had occasional GI complaints including stomach cramping and nausea, no vomiting or diarrhea.  She had weight loss.  She was advised to follow-up with Alejandra Davis to make sure there is no medical reason for Alejandra weight loss.  She was advised to increase Alejandra donepezil to 10 mg daily.  I had previously talked Alejandra about pursuing a sleep study but she did not wish to pursue this. ? ?Alejandra Davis called in the interim in September 2020 reporting that she significant side effects from the donepezil and she was therefore advised to taper off of it. ? ?The patient had a follow-up appointment with [name], NP on 11/28/2019, at which time she was accompanied by Alejandra Davis.  Alejandra side effects had resolved after stopping the donepezil.  Alejandra MMSE was 23 at the time.  The addition of Namenda was discussed but it was not started due to Alejandra family being concerned about side effects.  ? ?She did not return for follow-up since then. ? ?She had an interim evaluation as per PCP request, with Dr. Alphonzo Davis, neuropsychologist, in February 2022 and his impression was <<Diagnostic Impressions: ?Alzheimer's disease, late onset ?Cerebrovascular small vessel disease>>. ?She had a feedback appointment with Dr. Nicole Davis in February 2022. ? ?She was advised no longer to drive. ? ?Today, 03/19/2022: She reports feeling okay, she is not able to provide much in the way of details of Alejandra history.  She denies any pain, any depression  or stress.  She has not fallen.  Per Alejandra Davis she does Alejandra own laundry and also cooks some.  Alejandra Davis is there from 10 AM to 2 PM daily, Mondays through Fridays, there is another caretaker that is there from 4 PM to 7 PM daily, nobody stays with Alejandra overnight.  There have been no reports of falls, no wandering, no behavioral escalations, sometimes patient gets frustrated at times if she does not remember something.  No anger outbursts.  She tries to hydrate well, drinks 8 ounce bottles of water, typically 3 bottles well Alejandra Davis is there.  She has not driven a car in the months as far as Alejandra caretaker knows.  Patient reports that she drove Alejandra car to the grocery store in the afternoon some 2 weeks ago.  This is not confirmed by the caretaker. ?I reviewed Alejandra office visit note with Alejandra Davis on 11/18/2021.  She had blood work at the time which I reviewed: BUN was elevated at 28, creatinine elevated at 1.37.  She had a total cholesterol of 216, thyroid function was benign, LDL 112.  She was started on memantine ER 7 mg strength in late November 2022 as I understand.  She seems to tolerate this well, Alejandra caretaker reports occasional diarrhea. ?  ?The patient's allergies, current medications, family history, past medical history, past social history, past surgical history and problem list were reviewed and updated as appropriate.  ?  ?Previously:  ?  ? ?I first  met Alejandra on 05/09/2019 at the request of Alejandra Davis physician, at which time Alejandra Davis reported that patient had memory loss for the past 8 years with gradual progression, more noticeable in the past 2 years. ?Alejandra MMSE was 20 out of 30 at the time, she had a recent brain MRI and we discussed the findings.  She was advised to proceed with a sleep study.  I also suggested she start low-dose generic Aricept 5 mg daily.  She has had recent labs in March and May 2020. ?  ?She did not have a sleep study. ?  ?05/09/2019: (She) reports forgetfulness for the past  few years.  Alejandra Davis reports that she has had memory loss for the past 8 years, it started gradually, was slowly progressive but more noticeable in the past 2 years.  She has not had any major confusion or hallucinations or personality or mood changes.  She lives alone, she is divorced, has 1 son who lives close by and one Davis who lives close by, Davis lives in [place].  She is a non-smoker and drinks caffeine and limitation, typically 1 cup of coffee in the morning, occasional tea, occasional soda, typically no milk, some orange juice with breakfast.  She does snore, she may have had some breathing pauses while asleep as witnessed by Alejandra Davis when they were on vacation once and shared room.  Patient denies any significant daytime somnolence.  She does not have night to night nocturia, does have recurrent headaches but is not sure if she wakes up with headaches in the middle of the night or first thing in the morning.  The first symptoms with regard to Alejandra memory have been repeating herself for asking the same questions over again.  This was first noticed by Alejandra Davis during a family gathering some 8 years ago.  She has some family history of memory loss, mom had memory issues in Alejandra late years, died at late 68s, father lived also to be in the late 68s, may have had some minor memory issues but patient's Davis does not recall grandfather to have major memory issues, she feels he was quite sharp.  She does remember that patient's mom had some memory issues.  Patient also reports maternal grandmother had memory loss.  Paternal grandmother and paternal aunt may have had some memory issues as well.  No actual diagnosis of Alzheimer's disease in the family.  She lives alone, she still drives, Davis is somewhat worried about Alejandra driving but patient limits Alejandra driving typically to local routes and familiar places.  She has not fallen recently.  She had a prior head CT with and without contrast on  10/31/2003 and the Davis brought a report.  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  I reviewed your office note from September 2019.  She has had recent blood work which I reviewed, she had CBC, CMP, INR, ESR on 05/01/2019 with largely unremarkable results.  She had a TSH and urinalysis on 03/13/2019, with unremarkable results, A1c in March 2020 is slightly higher than before but still below 7 at 6.9.  She had a recent brain MRI without contrast on 05/02/2019 and I reviewed the results: IMPRESSION: ?1. No acute intracranial abnormality identified. ?2. Moderate chronic microvascular ischemic changes and volume loss ?of the brain. Small chronic infarct in right hemi pons. ?  ?She had a head CT without contrast on 05/01/2019 and I reviewed the results: IMPRESSION: ?Atrophy, chronic microvascular disease. ?  ?  No acute intracranial abnormality. ?Of note, she presented to the emergency room on 05/01/2019 with new onset headache, has had a prior headache similar to the 1.  She was treated symptomatically, she had a head CT and brain MRI.  She had an elevated blood pressure. ? ? ?Alejandra Past Medical History Is Significant For: ?Past Medical History:  ?Diagnosis Date  ? Chicken pox   ? Cystocele   ? Diabetes mellitus without complication ([place])   ? Glaucoma   ? Hypertension   ? Hypertensive retinopathy   ? OU  ? Neuromuscular disorder ([place])   ? Dementia   ? Thyroid disease   ? hypothyroidism  ? ? ?Alejandra Past Surgical History Is Significant For: ?Past Surgical History:  ?Procedure Laterality Date  ? ABDOMINAL HYSTERECTOMY  1990  ? TAH BSO  ? CATARACT EXTRACTION Bilateral   ? Dr. [name]  ? COLONOSCOPY WITH PROPOFOL N/A 04/08/2021  ? Procedure: COLONOSCOPY WITH PROPOFOL;  Surgeon: [name], MD;  Location: [facility];  Service: Endoscopy;  Laterality: N/A;  ? EYE SURGERY Bilateral   ? Cat Sx OU  ? OOPHORECTOMY    ? BSO  ? Vaginal Bx    ? Papilloma  ? ? ?Alejandra Family History Is Significant For: ?Family History   ?Problem Relation Age of Onset  ? Sickle cell anemia Other   ? Hypertension Mother   ? Cancer Father   ?     LIVER  ? Heart disease Brother   ? Cancer Brother   ?     STOMACH  ? ? ?Alejandra Social History Is Sign

## 2022-03-19 NOTE — Patient Instructions (Addendum)
It was nice to see you again today. ?You can continue with the Memantine, but I would like to increase it from a starting dose of 7 mg to a maintenance dose of 14 mg daily at this point.  Please follow-up to see [name] in 3 to 4 months.  Try to hydrate well with water, you have mild kidney impairment.  You had blood work with your primary care in November that indicated mild kidney dysfunction.  Please follow-up with your primary care as scheduled as well. ?

## 2022-03-20 NOTE — Progress Notes (Shared)
?[facility] Note ? ?03/24/2022 ? ?  ? ?CHIEF COMPLAINT ?Patient presents for No chief complaint on file. ? ?HISTORY OF PRESENT ILLNESS: ?Alejandra Davis is a 86 y.o. female who presents to the clinic today for:  ? ?Patient states vision became blurry in OD, some pain yesterday. Not having issues today.  ? ?Referring physician: ?Faustino Congress, NP ?[address] ?[place],  [place] [address] ? ?HISTORICAL INFORMATION:  ? ?Selected notes from the [facility] ?Referred by [name] for DM exam  ? ?CURRENT MEDICATIONS: ?Current Outpatient Medications (Ophthalmic Drugs)  ?Medication Sig  ? dorzolamide-timolol (COSOPT) 22.3-6.8 MG/ML ophthalmic solution Place 1 drop into both eyes 2 (two) times daily.  ? ?No current facility-administered medications for this visit. (Ophthalmic Drugs)  ? ?Current Outpatient Medications (Other)  ?Medication Sig  ? amLODipine (NORVASC) 10 MG tablet TAKE 1 TABLET(10 MG) BY MOUTH DAILY  ? ASPIRIN LOW DOSE 81 MG EC tablet Take 81 mg by mouth daily. (Patient not taking: Reported on 03/19/2022)  ? Lancets Ultra Thin 30G MISC 100 each by Does not apply route 3 (three) times a week. (Patient not taking: Reported on 03/19/2022)  ? levothyroxine (SYNTHROID) 75 MCG tablet Take 1 tablet (75 mcg total) by mouth daily.  ? lisinopril (ZESTRIL) 2.5 MG tablet Take 1 tablet (2.5 mg total) by mouth daily.  ? loratadine (CLARITIN) 10 MG tablet TAKE 1 TABLET(10 MG) BY MOUTH DAILY  ? memantine (NAMENDA XR) 14 MG CP24 24 hr capsule Take 1 capsule (14 mg total) by mouth daily.  ? metoprolol tartrate (LOPRESSOR) 50 MG tablet TAKE 1 TABLET(50 MG) BY MOUTH DAILY  ? ?No current facility-administered medications for this visit. (Other)  ? ?REVIEW OF SYSTEMS: ? ? ?ALLERGIES ?Allergies  ?Allergen Reactions  ? Donepezil Hcl   ?  Stomach cramps   ? ?PAST MEDICAL HISTORY ?Past Medical History:  ?Diagnosis Date  ? Chicken pox   ? Cystocele   ? Diabetes mellitus without complication  ([place])   ? Glaucoma   ? Hypertension   ? Hypertensive retinopathy   ? OU  ? Neuromuscular disorder ([place])   ? Dementia   ? Thyroid disease   ? hypothyroidism  ? ?Past Surgical History:  ?Procedure Laterality Date  ? ABDOMINAL HYSTERECTOMY  1990  ? TAH BSO  ? CATARACT EXTRACTION Bilateral   ? Dr. [name]  ? COLONOSCOPY WITH PROPOFOL N/A 04/08/2021  ? Procedure: COLONOSCOPY WITH PROPOFOL;  Surgeon: [name], MD;  Location: [facility];  Service: Endoscopy;  Laterality: N/A;  ? EYE SURGERY Bilateral   ? Cat Sx OU  ? OOPHORECTOMY    ? BSO  ? Vaginal Bx    ? Papilloma  ? ?FAMILY HISTORY ?Family History  ?Problem Relation Age of Onset  ? Sickle cell anemia Other   ? Hypertension Mother   ? Cancer Father   ?     LIVER  ? Heart disease Brother   ? Cancer Brother   ?     STOMACH  ? ?SOCIAL HISTORY ?Social History  ? ?Tobacco Use  ? Smoking status: Never  ? Smokeless tobacco: Never  ?Vaping Use  ? Vaping Use: Never used  ?Substance Use Topics  ? Alcohol use: No  ? Drug use: No  ?  ? ?  ?OPHTHALMIC EXAM: ?Not recorded ?  ? ? ?IMAGING AND PROCEDURES  ?Imaging and Procedures for 03/07/18 ? ? ? ? ?  ?  ? ?  ?ASSESSMENT/PLAN: ?  ICD-10-CM   ?1. Branch retinal vein occlusion of right eye with macular edema  H34.8310   ?  ?2. Diabetes mellitus type 2 without retinopathy ([place])  E11.9   ?  ?3. Epiretinal membrane (ERM) of both eyes  H35.373   ?  ?4. Pseudophakia of both eyes  Z96.1   ?  ?5. Ocular hypertension of right eye  H40.051   ?  ?6. Glaucoma suspect of both eyes  H40.003   ?  ?7. Essential hypertension  I10   ?  ?8. Hypertensive retinopathy of both eyes  H35.033   ?  ? ? ? ?1. BRVO with CME OD ? - lost to follow up from 02.22.22 to 11.1.22 -- re-presented with massive CME and BCVA 20/400 from 20/30 ? - initial OCT w/ CME superior macula ? - s/p IVA OD #1 (10.18.19), #2 (11.19.19), #3 (12.17.19), #4 (02.19.20), #5 (03.23.20), #6 (04.21.20), #7 (05.27.20), #8 (07.07.20), #9 (08.25.20), #10 (9.29.20), #11 (11.17.20), #12  (12.22.20), #13 (01.27.21), #14 (03.10.21), #15 (04.21.21), #16 (05.26.21), #17 (06.30.21), #18 (08.04.21), #19 (09.01.21), #20 (10.06.21), #21 (11.17.21), #22 (12.30.21), #23 (2.22.22), #24 (11.1.22), #25 (11.29.22), #26 (01.03.23), #27 (02.15.23), #28 (03.28.23) ? - good response to medications, but held IVA in January 2020 due to cataract surgery ? - today, BCVA OD 20/150 -- improvement limited by central atrophy ? - OCT shows stable improvement in CME and foveal contour; just trace cystic changes remain; central SRHM and patchy ORA  ? - recommend IVA OD #29 today, 03.28.23 ? - [facility] informed consent form signed and scanned on 01.27.2021 ? - history of interval increase in IRF/cystic changes at 6+ weeks interval ? - f/u as scheduled -- DFE/OCT, possible injection  ? ?2. Diabetes mellitus, type 2 without retinopathy ? - The incidence, risk factors for progression, natural history and treatment options for diabetic retinopathy  were discussed with patient.   ?  - The need for close monitoring of blood glucose, blood pressure, and serum lipids, avoiding cigarette or any type of tobacco, and the need for long term follow up was also discussed with patient.  ?  ?3. Epiretinal membrane, OU ? - relatively mild ERM OU -- blunted central foveal depression ? - OD with mild cystic changes 2/2 BRVO as above; OS without cystic changes or edema ? - discussed findings and prognosis  ? - monitor  ? ?4. Pseudophakia OU ? - s/p CE/IOL OS 11.13.19 w/ Dr. [name] ? - s/p CE/IOL OD 01.16.20 w/ Dr. [name] ? - beautiful surgeries -- IOLs in perfect position ? - healing well post-operatively ? - IRF/CME OD may have been partly due to post op CME ([place]) in addition to BRVO ? ?5,6. Ocular hypertension / Glaucoma suspect OD>OS ? - IOP ~25 OD, 20 OS at prior clinic visits ? - today IOP 12,13 ? - denies any family hx of glaucoma ? - uses Cosopt BID OU ? ?7,8. Hypertensive retinopathy OU ? - pt presented to 6.24.2020 visit urgently  for right sided headache above right eye with extension to occiput ? - BP at that time was 201/100 -- pt was sent to primary care clinic for urgent evaluation and meds were adjusted  ? - BP now under better control and headaches improved ? - BP reading 11.1.22 -- 180/84 -- advised f/u with PCP ? - discussed importance of tight BP control ? ?Ophthalmic Meds Ordered this visit:  ?No orders of the defined types were placed in this encounter. ?  ? ?No follow-ups  on file. ? ?There are no Patient Instructions on file for this visit. ? ?This document serves as a record of services personally performed by Gardiner Sleeper, MD, PhD. It was created on their behalf by Leonie Douglas, an ophthalmic technician. The creation of this record is the provider's dictation and/or activities during the visit.   ? ?Electronically signed by: Leonie Douglas COA, 03/20/22  1:58 PM ? ? ?Gardiner Sleeper, M.D., Ph.D. ?Diseases & Surgery of the Retina and Vitreous ?[place] ? ? ?Abbreviations: ?M myopia (nearsighted); A astigmatism; H hyperopia (farsighted); P presbyopia; Mrx spectacle prescription;  CTL contact lenses; OD right eye; OS left eye; OU both eyes  XT exotropia; ET esotropia; PEK punctate epithelial keratitis; PEE punctate epithelial erosions; DES dry eye syndrome; MGD meibomian gland dysfunction; ATs artificial tears; PFAT's preservative free artificial tears; [place] nuclear sclerotic cataract; PSC posterior subcapsular cataract; ERM epi-retinal membrane; PVD posterior vitreous detachment; RD retinal detachment; DM diabetes mellitus; DR diabetic retinopathy; NPDR non-proliferative diabetic retinopathy; PDR proliferative diabetic retinopathy; CSME clinically significant macular edema; DME diabetic macular edema; dbh dot blot hemorrhages; CWS cotton wool spot; POAG primary open angle glaucoma; C/D cup-to-disc ratio; HVF humphrey visual field; GVF goldmann visual field; OCT optical coherence tomography; IOP  intraocular pressure; BRVO Branch retinal vein occlusion; CRVO central retinal vein occlusion; CRAO central retinal artery occlusion; BRAO branch retinal artery occlusion; RT retinal tear; SB scleral buckle; PPV p

## 2022-03-24 ENCOUNTER — Encounter (INDEPENDENT_AMBULATORY_CARE_PROVIDER_SITE_OTHER): Payer: Medicare PPO | Admitting: Ophthalmology

## 2022-03-24 ENCOUNTER — Emergency Department (HOSPITAL_COMMUNITY)
Admission: EM | Admit: 2022-03-24 | Discharge: 2022-03-24 | Disposition: A | Discharge status: Home / Self Care | Payer: Medicare PPO | Attending: Emergency Medicine | Admitting: Emergency Medicine

## 2022-03-24 ENCOUNTER — Encounter (HOSPITAL_COMMUNITY): Payer: Self-pay

## 2022-03-24 ENCOUNTER — Other Ambulatory Visit: Payer: Self-pay

## 2022-03-24 ENCOUNTER — Emergency Department (HOSPITAL_COMMUNITY): Payer: Medicare PPO

## 2022-03-24 ENCOUNTER — Encounter (INDEPENDENT_AMBULATORY_CARE_PROVIDER_SITE_OTHER): Payer: Self-pay

## 2022-03-24 DIAGNOSIS — Z961 Presence of intraocular lens: Secondary | ICD-10-CM

## 2022-03-24 DIAGNOSIS — Z7982 Long term (current) use of aspirin: Secondary | ICD-10-CM | POA: Diagnosis not present

## 2022-03-24 DIAGNOSIS — H35373 Puckering of macula, bilateral: Secondary | ICD-10-CM

## 2022-03-24 DIAGNOSIS — R519 Headache, unspecified: Secondary | ICD-10-CM

## 2022-03-24 DIAGNOSIS — I10 Essential (primary) hypertension: Secondary | ICD-10-CM

## 2022-03-24 DIAGNOSIS — Z79899 Other long term (current) drug therapy: Secondary | ICD-10-CM | POA: Insufficient documentation

## 2022-03-24 DIAGNOSIS — H35033 Hypertensive retinopathy, bilateral: Secondary | ICD-10-CM

## 2022-03-24 DIAGNOSIS — H34831 Tributary (branch) retinal vein occlusion, right eye, with macular edema: Secondary | ICD-10-CM

## 2022-03-24 DIAGNOSIS — E119 Type 2 diabetes mellitus without complications: Secondary | ICD-10-CM

## 2022-03-24 DIAGNOSIS — F0390 Unspecified dementia without behavioral disturbance: Secondary | ICD-10-CM | POA: Diagnosis not present

## 2022-03-24 DIAGNOSIS — H40051 Ocular hypertension, right eye: Secondary | ICD-10-CM

## 2022-03-24 DIAGNOSIS — H40003 Preglaucoma, unspecified, bilateral: Secondary | ICD-10-CM

## 2022-03-24 LAB — BASIC METABOLIC PANEL
Anion gap: 8 (ref 5–15)
BUN: 26 mg/dL — ABNORMAL HIGH (ref 8–23)
CO2: 23 mmol/L (ref 22–32)
Calcium: 9.6 mg/dL (ref 8.9–10.3)
Chloride: 111 mmol/L (ref 98–111)
Creatinine, Ser: 1.12 mg/dL — ABNORMAL HIGH (ref 0.44–1.00)
GFR, Estimated: 49 mL/min — ABNORMAL LOW (ref >60)
Glucose, Bld: 88 mg/dL (ref 70–99)
Potassium: 4 mmol/L (ref 3.5–5.1)
Sodium: 142 mmol/L (ref 135–145)

## 2022-03-24 LAB — CBC WITH DIFFERENTIAL/PLATELET
Abs Immature Granulocytes: 0.03 10*3/uL (ref 0.00–0.07)
Basophils Absolute: 0 10*3/uL (ref 0.0–0.1)
Basophils Relative: 1 %
Eosinophils Absolute: 0.2 10*3/uL (ref 0.0–0.5)
Eosinophils Relative: 4 %
HCT: 39.2 % (ref 36.0–46.0)
Hemoglobin: 12.6 g/dL (ref 12.0–15.0)
Immature Granulocytes: 1 %
Lymphocytes Relative: 28 %
Lymphs Abs: 1.6 10*3/uL (ref 0.7–4.0)
MCH: 26.6 pg (ref 26.0–34.0)
MCHC: 32.1 g/dL (ref 30.0–36.0)
MCV: 82.7 fL (ref 80.0–100.0)
Monocytes Absolute: 0.6 10*3/uL (ref 0.1–1.0)
Monocytes Relative: 10 %
Neutro Abs: 3.2 10*3/uL (ref 1.7–7.7)
Neutrophils Relative %: 56 %
Platelets: 215 10*3/uL (ref 150–400)
RBC: 4.74 MIL/uL (ref 3.87–5.11)
RDW: 18.3 % — ABNORMAL HIGH (ref 11.5–15.5)
WBC: 5.7 10*3/uL (ref 4.0–10.5)
nRBC: 0 % (ref 0.0–0.2)

## 2022-03-24 MED ORDER — DIPHENHYDRAMINE HCL 50 MG/ML IJ SOLN
25.0000 mg | Freq: Once | INTRAMUSCULAR | Status: AC
  Administered 2022-03-24: 25 mg via INTRAVENOUS
  Filled 2022-03-24: qty 1

## 2022-03-24 MED ORDER — METOCLOPRAMIDE HCL 5 MG/ML IJ SOLN
10.0000 mg | Freq: Once | INTRAMUSCULAR | Status: AC
  Administered 2022-03-24: 10 mg via INTRAVENOUS
  Filled 2022-03-24: qty 2

## 2022-03-24 MED ORDER — SODIUM CHLORIDE 0.9 % IV BOLUS
500.0000 mL | Freq: Once | INTRAVENOUS | Status: AC
  Administered 2022-03-24: 500 mL via INTRAVENOUS

## 2022-03-24 NOTE — Discharge Instructions (Signed)
Continue your blood pressure medicine as prescribed by your doctor. ? ?You need to follow-up with your doctor in a week regarding your blood pressure ? ?You can take Tylenol or Motrin for headache ? ?Return to ER if you have worse headache, vomiting, weakness, numbness ?

## 2022-03-24 NOTE — ED Provider Notes (Signed)
?[facility] DEPT ?Provider Note ? ? ?CSN: [id number] ?Arrival date & time: 03/24/22  2037 ? ?  ? ?History ? ?Chief Complaint  ?Patient presents with  ? Headache  ? ? ?Alejandra Davis is a 86 y.o. female history of hypertension, dementia here presenting with headache.  Patient states that she had she had onset of headache around 5:30 PM.  Her friend came to visit her and noticed that she appears to be in pain.  She tried to encourage her to eat some food but she had no appetite.  Patient was noted to be hypertensive as well.  She has a history of hypertension and has been compliant with her meds.  Patient has no history of headaches for ? ?The history is provided by the patient.  ? ?  ? ?Home Medications ?Prior to Admission medications   ?Medication Sig Start Date End Date Taking? Authorizing Provider  ?amLODipine (NORVASC) 10 MG tablet TAKE 1 TABLET(10 MG) BY MOUTH DAILY 10/14/21   Ngetich, Dinah C, NP  ?ASPIRIN LOW DOSE 81 MG EC tablet Take 81 mg by mouth daily. ?Patient not taking: Reported on 03/19/2022 07/13/21   [provider]  ?dorzolamide-timolol (COSOPT) 22.3-6.8 MG/ML ophthalmic solution Place 1 drop into both eyes 2 (two) times daily. 10/15/20   [provider]  ?Lancets Ultra Thin 30G MISC 100 each by Does not apply route 3 (three) times a week. ?Patient not taking: Reported on 03/19/2022 09/18/20   Ngetich, Dinah C, NP  ?levothyroxine (SYNTHROID) 75 MCG tablet Take 1 tablet (75 mcg total) by mouth daily. 10/06/21   Ngetich, Nelda Bucks, NP  ?lisinopril (ZESTRIL) 2.5 MG tablet Take 1 tablet (2.5 mg total) by mouth daily. 10/06/21   Ngetich, Dinah C, NP  ?loratadine (CLARITIN) 10 MG tablet TAKE 1 TABLET(10 MG) BY MOUTH DAILY 10/06/21   Ngetich, Dinah C, NP  ?memantine (NAMENDA XR) 14 MG CP24 24 hr capsule Take 1 capsule (14 mg total) by mouth daily. 03/19/22   Star Age, MD  ?metoprolol tartrate (LOPRESSOR) 50 MG tablet TAKE 1 TABLET(50 MG) BY MOUTH DAILY 10/06/21    Ngetich, Nelda Bucks, NP  ?   ? ?Allergies    ?Donepezil hcl   ? ?Review of Systems   ?Review of Systems  ?Neurological:  Positive for headaches.  ?All other systems reviewed and are negative. ? ?Physical Exam ?Updated Vital Signs ?BP (!) 161/92 (BP Location: Left Arm)   Pulse 79   Temp 98.1 ?F (36.7 ?C) (Oral)   Resp 15   Ht '5\' 3"'$  (1.6 m)   Wt 54.5 kg   SpO2 100%   BMI 21.29 kg/m?  ?Physical Exam ?Vitals and nursing note reviewed.  ?Constitutional:   ?   Comments: Slightly uncomfortable  ?HENT:  ?   Head: Normocephalic.  ?   Mouth/Throat:  ?   Mouth: Mucous membranes are moist.  ?Eyes:  ?   Extraocular Movements: Extraocular movements intact.  ?   Pupils: Pupils are equal, round, and reactive to light.  ?Cardiovascular:  ?   Rate and Rhythm: Normal rate and regular rhythm.  ?   Heart sounds: Normal heart sounds.  ?Pulmonary:  ?   Effort: Pulmonary effort is normal.  ?   Breath sounds: Normal breath sounds.  ?Abdominal:  ?   General: Bowel sounds are normal.  ?   Palpations: Abdomen is soft.  ?Musculoskeletal:     ?   General: Normal range of motion.  ?   Cervical  back: Normal range of motion and neck supple.  ?Skin: ?   General: Skin is warm.  ?Neurological:  ?   Mental Status: She is alert and oriented to person, place, and time.  ?   Comments: Cranial nerves II to 12 intact.  Patient has normal strength and sensation bilateral arms and legs.  Patient has normal gait  ?Psychiatric:     ?   Mood and Affect: Mood normal.     ?   Behavior: Behavior normal.  ? ? ?ED Results / Procedures / Treatments   ?Labs ?(all labs ordered are listed, but only abnormal results are displayed) ?Labs Reviewed  ?CBC WITH DIFFERENTIAL/PLATELET  ?BASIC METABOLIC PANEL  ? ? ?EKG ?None ? ?Radiology ?CT HEAD WO CONTRAST (5MM) ? ?Result Date: 03/24/2022 ?CLINICAL DATA:  Doll frontal headache, hypertension EXAM: CT HEAD WITHOUT CONTRAST TECHNIQUE: Contiguous axial images were obtained from the base of the skull through the vertex without  intravenous contrast. RADIATION DOSE REDUCTION: This exam was performed according to the departmental dose-optimization program which includes automated exposure control, adjustment of the mA and/or kV according to patient size and/or use of iterative reconstruction technique. COMPARISON:  04/06/2021 FINDINGS: Brain: No acute infarct or hemorrhage. Chronic small vessel ischemic changes throughout the periventricular and subcortical white matter, stable. Lateral ventricles and remaining midline structures are unremarkable. No acute extra-axial fluid collections. No mass effect. Vascular: Stable atherosclerosis.  No hyperdense vessel. Skull: Normal. Negative for fracture or focal lesion. Sinuses/Orbits: No acute finding. Other: None. IMPRESSION: 1. Stable chronic small-vessel ischemic changes throughout the white matter. 2. No acute intracranial process. Electronically Signed   By: Randa Ngo M.D.   On: 03/24/2022 22:14   ? ?Procedures ?Procedures  ? ? ?Medications Ordered in ED ?Medications  ?metoCLOPramide (REGLAN) injection 10 mg (10 mg Intravenous Given 03/24/22 2226)  ?diphenhydrAMINE (BENADRYL) injection 25 mg (25 mg Intravenous Given 03/24/22 2227)  ?sodium chloride 0.9 % bolus 500 mL (500 mLs Intravenous New Bag/Given 03/24/22 2226)  ? ? ?ED Course/ Medical Decision Making/ A&P ?  ?                        ?Medical Decision Making ?Alejandra Davis is a 86 y.o. female here presenting with headache and hypertension.  Concern for possible subarachnoid hemorrhage.  Patient's symptoms is less than 6 hours so if negative CT head, will not need CTA or LP.  We wil get CBC and BMP and give migraine cocktail ? ?11:29 PM ?Patient CBC and BMP unremarkable.  CT head did not show a head bleed.  Felt better after migraine cocktail.  Stable for discharge. ?  ? ?Problems Addressed: ?Bad headache: acute illness or injury ?Hypertension, unspecified type: acute illness or injury ? ?Amount and/or Complexity of Data Reviewed ?Labs:  ordered. Decision-making details documented in ED Course. ?Radiology: ordered and independent interpretation performed. Decision-making details documented in ED Course. ? ?Risk ?Prescription drug management. ? ? ?Final Clinical Impression(s) / ED Diagnoses ?Final diagnoses:  ?None  ? ? ?Rx / DC Orders ?ED Discharge Orders   ? ? None  ? ?  ? ? ?  ?Drenda Freeze, MD ?03/24/22 2330 ? ?

## 2022-03-24 NOTE — ED Triage Notes (Signed)
Reports dull headache worse around the forehead beginning tonight around 1730. Suspects it is related to HTN.  ?

## 2022-03-30 ENCOUNTER — Encounter (INDEPENDENT_AMBULATORY_CARE_PROVIDER_SITE_OTHER): Payer: Medicare PPO | Admitting: Ophthalmology

## 2022-04-01 NOTE — Progress Notes (Signed)
?[facility] Note ? ?04/03/2022 ? ?  ? ?CHIEF COMPLAINT ?Patient presents for Eye Exam ? ?HISTORY OF PRESENT ILLNESS: ?Alejandra Davis is a 86 y.o. female who presents to the clinic today for:  ?HPI   ? ? Eye Exam   ?In right eye.  Characterized as blurry vision.  I, the attending physician,  performed the HPI with the patient and updated documentation appropriately. ? ?  ?  ? ? Comments   ?Patient states that the vision in the right eye is blurry but that's normal for her. She states that she is using Systane OU BID and Cosopt OU BID. ? ?  ?  ?Last edited by [name], MD on 04/05/2022 12:47 AM.  ?  ? ?Patient states vision became blurry in OD, some pain yesterday. Not having issues today.  ? ?Referring physician: ?Faustino Congress, NP ?[address] ?[place],  [place] [address] ? ?HISTORICAL INFORMATION:  ? ?Selected notes from the [facility] ?Referred by [name] for DM exam  ? ?CURRENT MEDICATIONS: ?Current Outpatient Medications (Ophthalmic Drugs)  ?Medication Sig  ? dorzolamide-timolol (COSOPT) 22.3-6.8 MG/ML ophthalmic solution Place 1 drop into both eyes 2 (two) times daily.  ? ?No current facility-administered medications for this visit. (Ophthalmic Drugs)  ? ?Current Outpatient Medications (Other)  ?Medication Sig  ? amLODipine (NORVASC) 10 MG tablet TAKE 1 TABLET(10 MG) BY MOUTH DAILY  ? ASPIRIN LOW DOSE 81 MG EC tablet Take 81 mg by mouth daily.  ? Lancets Ultra Thin 30G MISC 100 each by Does not apply route 3 (three) times a week.  ? levothyroxine (SYNTHROID) 75 MCG tablet Take 1 tablet (75 mcg total) by mouth daily.  ? lisinopril (ZESTRIL) 2.5 MG tablet Take 1 tablet (2.5 mg total) by mouth daily.  ? loratadine (CLARITIN) 10 MG tablet TAKE 1 TABLET(10 MG) BY MOUTH DAILY  ? memantine (NAMENDA XR) 14 MG CP24 24 hr capsule Take 1 capsule (14 mg total) by mouth daily.  ? metoprolol tartrate (LOPRESSOR) 50 MG tablet TAKE 1 TABLET(50 MG) BY MOUTH DAILY  ? ?No  current facility-administered medications for this visit. (Other)  ? ?REVIEW OF SYSTEMS: ?ROS   ?Positive for: Skin, Genitourinary, Musculoskeletal, Endocrine, Cardiovascular, Eyes ?Negative for: Constitutional, Gastrointestinal, Neurological, HENT, Respiratory, Psychiatric, Allergic/Imm, Heme/Lymph ?Last edited by Annie Paras, COT on 04/03/2022  9:31 AM.  ?  ? ?ALLERGIES ?Allergies  ?Allergen Reactions  ? Donepezil Hcl   ?  Stomach cramps   ? ?PAST MEDICAL HISTORY ?Past Medical History:  ?Diagnosis Date  ? Chicken pox   ? Cystocele   ? Diabetes mellitus without complication ([place])   ? Glaucoma   ? Hypertension   ? Hypertensive retinopathy   ? OU  ? Neuromuscular disorder ([place])   ? Dementia   ? Thyroid disease   ? hypothyroidism  ? ?Past Surgical History:  ?Procedure Laterality Date  ? ABDOMINAL HYSTERECTOMY  1990  ? TAH BSO  ? CATARACT EXTRACTION Bilateral   ? Dr. [name]  ? COLONOSCOPY WITH PROPOFOL N/A 04/08/2021  ? Procedure: COLONOSCOPY WITH PROPOFOL;  Surgeon: [name], MD;  Location: [facility];  Service: Endoscopy;  Laterality: N/A;  ? EYE SURGERY Bilateral   ? Cat Sx OU  ? OOPHORECTOMY    ? BSO  ? Vaginal Bx    ? Papilloma  ? ?FAMILY HISTORY ?Family History  ?Problem Relation Age of Onset  ? Sickle cell anemia Other   ? Hypertension  Mother   ? Cancer Father   ?     LIVER  ? Heart disease Brother   ? Cancer Brother   ?     STOMACH  ? ?SOCIAL HISTORY ?Social History  ? ?Tobacco Use  ? Smoking status: Never  ? Smokeless tobacco: Never  ?Vaping Use  ? Vaping Use: Never used  ?Substance Use Topics  ? Alcohol use: No  ? Drug use: No  ?  ? ?  ?OPHTHALMIC EXAM: ?Base Eye Exam   ? ? Visual Acuity (Snellen - Linear)   ? ?   Right Left  ? Dist cc 20/200 20/80 -2  ? Dist ph cc NI 20/40  ? ?  ?  ? ? Tonometry (Tonopen, 9:42 AM)   ? ?   Right Left  ? Pressure 13 12  ? ?  ?  ? ? Pupils   ? ?   Pupils Dark Light Shape React APD  ? Right PERRL 3 2 Round Slow None  ? Left PERRL 3 2 Round Slow None  ? ?  ?   ? ? Visual Fields   ? ?   Left Right  ?  Full   ? Restrictions  Partial outer superior temporal, inferior temporal deficiencies  ? ?  ?  ? ? Extraocular Movement   ? ?   Right Left  ?  Full Full  ? ?  ?  ? ? Neuro/Psych   ? ? Oriented x3: Yes  ? Mood/Affect: Normal  ? ?  ?  ? ? Dilation   ? ? Both eyes: 1.0% Mydriacyl, 2.5% Phenylephrine @ 9:38 AM  ? ?  ?  ? ?  ? ?Slit Lamp and Fundus Exam   ? ? Slit Lamp Exam   ? ?   Right Left  ? Lids/Lashes Dermatochalasis - upper lid, Meibomian gland dysfunction Dermatochalasis - upper lid, Meibomian gland dysfunction  ? Conjunctiva/Sclera Melanosis Melanosis  ? Cornea Arcus, 1-2+ Punctate epithelial erosions, tear film debris, 1+ Guttata Arcus, Inferior 1+ Punctate epithelial erosions, well healed cataract wound  ? Anterior Chamber Deep and quiet Deep;  no cell or flare  ? Iris Round and dilated, No NVI Round and dilated, No NVI  ? Lens PC IOL in good position, 2+ Posterior capsular opacification Posterior chamber intraocular lens in good position, 1-2+ PCO.  ? Anterior Vitreous Vitreous syneresis Vitreous syneresis  ? ?  ?  ? ? Fundus Exam   ? ?   Right Left  ? Disc Sharp rim, inferior notch, 3+ pallor, inf. Rim thinning, attenuated vessels superiorly, +disc heme at 1100--fading, fine vascular loops superiorly 2+ pallor, Sharp rim, thin inferior rim  ? C/D Ratio 0.85 0.75  ? Macula Blunted foveal reflex, edema superior mac-- stably improved; stable improvement in DBH superior mac, central exudates and fibrosis, trace cystic changes Flat, good foveal reflex, mild RPE mottling and clumping, Epiretinal membrane, No heme or edema  ? Vessels superior BRVO with severe attenuation of ST arterioles, Tortuous attenuated, mild tortuousity, + copper wiring  ? Periphery Attached, DBH superior hemisphere extended from BRVO--improved Attached, mild, scattered Reticular degeneration, No heme  ? ?  ?  ? ?  ? ?Refraction   ? ? Wearing Rx   ? ?   Sphere Cylinder Axis Add  ? Right -0.75 +1.25  170 +2.50  ? Left -0.75 +1.00 003 +2.50  ? ? Type: PAL  ? ?  ?  ? ? Wearing Rx #2   ? ?  Sphere Cylinder Axis Add  ? Right -0.75 +1.25 170 +2.50  ? Left -0.75 +1.00 003 +2.50  ? ? Type: PAL  ? ?  ?  ? ?  ? ? ?IMAGING AND PROCEDURES  ?Imaging and Procedures for 03/07/18 ? ?OCT, Retina - OU - Both Eyes   ? ?   ?Right Eye ?Quality was good. Central Foveal Thickness: 249. Progression has worsened. Findings include epiretinal membrane, abnormal foveal contour, no SRF, outer retinal atrophy, subretinal hyper-reflective material, intraretinal hyper-reflective material, intraretinal fluid (Mild interval increase in central cystic changes, central SRHM and patchy ORA; diffuse retinal thinning--stable ).  ? ?Left Eye ?Quality was good. Central Foveal Thickness: 255. Progression has been stable. Findings include abnormal foveal contour, epiretinal membrane, no IRF, no SRF, vitreomacular adhesion (Blunted foveal depression--stable).  ? ?Notes ?*Images captured and stored on drive ? ?Diagnosis / Impression:  ?ERM OU ?OD:  Mild interval increase in central cystic changes, central SRHM and patchy ORA; diffuse retinal thinning--stable.  ?OS: very mild ERM with blunted foveal depression -- stable; mild VMA ? ?Clinical management:  ?See below ? ?Abbreviations: NFP - Normal foveal profile. CME - cystoid macular edema. PED - pigment epithelial detachment. IRF - intraretinal fluid. SRF - subretinal fluid. EZ - ellipsoid zone. ERM - epiretinal membrane. ORA - outer retinal atrophy. ORT - outer retinal tubulation. SRHM - subretinal hyper-reflective material ? ? ? ?  ? ?Intravitreal Injection, Pharmacologic Agent - OD - Right Eye   ? ?   ?Time Out ?04/03/2022. 10:40 AM. Confirmed correct patient, procedure, site, and patient consented.  ? ?Anesthesia ?Topical anesthesia was used. Anesthetic medications included Lidocaine 2%, Proparacaine 0.5%.  ? ?Procedure ?Preparation included 5% betadine to ocular surface, eyelid speculum. A (32g) needle  was used.  ? ?Injection: ?1.25 mg Bevacizumab 1.47m/0.05ml ?  Route: Intravitreal, Site: Right Eye ?  [place] [id number] Lot:: [id number] Expiration date: 05/06/2022  ? ?Post-op ?Post injection exam found visu

## 2022-04-02 DIAGNOSIS — F03B0 Unspecified dementia, moderate, without behavioral disturbance, psychotic disturbance, mood disturbance, and anxiety: Secondary | ICD-10-CM | POA: Diagnosis not present

## 2022-04-02 DIAGNOSIS — R410 Disorientation, unspecified: Secondary | ICD-10-CM | POA: Diagnosis not present

## 2022-04-02 DIAGNOSIS — R5383 Other fatigue: Secondary | ICD-10-CM | POA: Diagnosis not present

## 2022-04-02 DIAGNOSIS — Z87440 Personal history of urinary (tract) infections: Secondary | ICD-10-CM | POA: Diagnosis not present

## 2022-04-03 ENCOUNTER — Encounter (INDEPENDENT_AMBULATORY_CARE_PROVIDER_SITE_OTHER): Payer: Self-pay | Admitting: Ophthalmology

## 2022-04-03 ENCOUNTER — Ambulatory Visit (INDEPENDENT_AMBULATORY_CARE_PROVIDER_SITE_OTHER): Payer: Medicare PPO | Admitting: Ophthalmology

## 2022-04-03 DIAGNOSIS — H35033 Hypertensive retinopathy, bilateral: Secondary | ICD-10-CM | POA: Diagnosis not present

## 2022-04-03 DIAGNOSIS — H40003 Preglaucoma, unspecified, bilateral: Secondary | ICD-10-CM

## 2022-04-03 DIAGNOSIS — Z961 Presence of intraocular lens: Secondary | ICD-10-CM | POA: Diagnosis not present

## 2022-04-03 DIAGNOSIS — H40051 Ocular hypertension, right eye: Secondary | ICD-10-CM | POA: Diagnosis not present

## 2022-04-03 DIAGNOSIS — H34831 Tributary (branch) retinal vein occlusion, right eye, with macular edema: Secondary | ICD-10-CM | POA: Diagnosis not present

## 2022-04-03 DIAGNOSIS — I10 Essential (primary) hypertension: Secondary | ICD-10-CM | POA: Diagnosis not present

## 2022-04-03 DIAGNOSIS — H35373 Puckering of macula, bilateral: Secondary | ICD-10-CM | POA: Diagnosis not present

## 2022-04-03 DIAGNOSIS — E119 Type 2 diabetes mellitus without complications: Secondary | ICD-10-CM

## 2022-04-05 ENCOUNTER — Encounter (INDEPENDENT_AMBULATORY_CARE_PROVIDER_SITE_OTHER): Payer: Self-pay | Admitting: Ophthalmology

## 2022-04-05 DIAGNOSIS — I10 Essential (primary) hypertension: Secondary | ICD-10-CM | POA: Diagnosis not present

## 2022-04-05 DIAGNOSIS — H35373 Puckering of macula, bilateral: Secondary | ICD-10-CM | POA: Diagnosis not present

## 2022-04-05 DIAGNOSIS — H35033 Hypertensive retinopathy, bilateral: Secondary | ICD-10-CM | POA: Diagnosis not present

## 2022-04-05 DIAGNOSIS — H40003 Preglaucoma, unspecified, bilateral: Secondary | ICD-10-CM | POA: Diagnosis not present

## 2022-04-05 DIAGNOSIS — Z961 Presence of intraocular lens: Secondary | ICD-10-CM | POA: Diagnosis not present

## 2022-04-05 DIAGNOSIS — H40051 Ocular hypertension, right eye: Secondary | ICD-10-CM | POA: Diagnosis not present

## 2022-04-05 DIAGNOSIS — H34831 Tributary (branch) retinal vein occlusion, right eye, with macular edema: Secondary | ICD-10-CM

## 2022-04-05 MED ORDER — BEVACIZUMAB CHEMO INJECTION 1.25MG/0.05ML SYRINGE FOR KALEIDOSCOPE
1.2500 mg | INTRAVITREAL | Status: AC | PRN
  Administered 2022-04-05: 1.25 mg via INTRAVITREAL

## 2022-04-09 ENCOUNTER — Other Ambulatory Visit: Payer: Self-pay | Admitting: Family

## 2022-04-09 DIAGNOSIS — I10 Essential (primary) hypertension: Secondary | ICD-10-CM

## 2022-04-14 DIAGNOSIS — H04123 Dry eye syndrome of bilateral lacrimal glands: Secondary | ICD-10-CM | POA: Diagnosis not present

## 2022-04-14 DIAGNOSIS — H401133 Primary open-angle glaucoma, bilateral, severe stage: Secondary | ICD-10-CM | POA: Diagnosis not present

## 2022-05-08 NOTE — Progress Notes (Signed)
[facility] Note  05/15/2022     CHIEF COMPLAINT Patient presents for Retina Follow Up  HISTORY OF PRESENT ILLNESS: Alejandra Davis is a 86 y.o. female who presents to the clinic today for:  HPI     Retina Follow Up   Patient presents with  CRVO/BRVO (IVA OD #28 (04.07.23)).  In right eye.  This started months ago.  Duration of 6 weeks.  Since onset it is stable.  I, the attending physician,  performed the HPI with the patient and updated documentation appropriately.        Comments   Patient denies noticing any vision changes at this time. She does not check her blood sugar regularly. She does not know her A1C either.       Last edited by [name], MD on 05/15/2022  1:06 PM.    Patient states vision is stable, she saw Dr. Lucianne Lei for glaucoma recently, she is still using Cosopt and Brimonidine  Referring physician: Faustino Congress, NP [place],  [place] [address]  HISTORICAL INFORMATION:   Selected notes from the MEDICAL RECORD NUMBER Referred by [name] for DM exam   CURRENT MEDICATIONS: Current Outpatient Medications (Ophthalmic Drugs)  Medication Sig   dorzolamide-timolol (COSOPT) 22.3-6.8 MG/ML ophthalmic solution Place 1 drop into both eyes 2 (two) times daily.   No current facility-administered medications for this visit. (Ophthalmic Drugs)   Current Outpatient Medications (Other)  Medication Sig   amLODipine (NORVASC) 10 MG tablet TAKE 1 TABLET(10 MG) BY MOUTH DAILY   ASPIRIN LOW DOSE 81 MG EC tablet Take 81 mg by mouth daily.   Lancets Ultra Thin 30G MISC 100 each by Does not apply route 3 (three) times a week.   levothyroxine (SYNTHROID) 75 MCG tablet Take 1 tablet (75 mcg total) by mouth daily.   lisinopril (ZESTRIL) 2.5 MG tablet Take 1 tablet (2.5 mg total) by mouth daily.   loratadine (CLARITIN) 10 MG tablet TAKE 1 TABLET(10 MG) BY MOUTH DAILY   memantine (NAMENDA XR) 14 MG CP24 24 hr capsule Take 1  capsule (14 mg total) by mouth daily.   metoprolol tartrate (LOPRESSOR) 50 MG tablet TAKE 1 TABLET(50 MG) BY MOUTH DAILY   No current facility-administered medications for this visit. (Other)   REVIEW OF SYSTEMS: ROS   Positive for: Skin, Genitourinary, Musculoskeletal, Endocrine, Cardiovascular, Eyes Negative for: Constitutional, Gastrointestinal, Neurological, HENT, Respiratory, Psychiatric, Allergic/Imm, Heme/Lymph Last edited by Annie Paras, COT on 05/15/2022  9:08 AM.     ALLERGIES Allergies  Allergen Reactions   Donepezil Hcl     Stomach cramps    PAST MEDICAL HISTORY Past Medical History:  Diagnosis Date   Chicken pox    Cystocele    Diabetes mellitus without complication ([place])    Glaucoma    Hypertension    Hypertensive retinopathy    OU   Neuromuscular disorder ([place])    Dementia    Thyroid disease    hypothyroidism   Past Surgical History:  Procedure Laterality Date   ABDOMINAL HYSTERECTOMY  1990   TAH BSO   CATARACT EXTRACTION Bilateral    Dr. [name]   COLONOSCOPY WITH PROPOFOL N/A 04/08/2021   Procedure: COLONOSCOPY WITH PROPOFOL;  Surgeon: [name], MD;  Location: [facility];  Service: Endoscopy;  Laterality: N/A;   EYE SURGERY Bilateral    Cat Sx OU   OOPHORECTOMY     BSO   Vaginal Bx  Papilloma   FAMILY HISTORY Family History  Problem Relation Age of Onset   Sickle cell anemia Other    Hypertension Mother    Cancer Father        LIVER   Heart disease Brother    Cancer Brother        STOMACH   SOCIAL HISTORY Social History   Tobacco Use   Smoking status: Never   Smokeless tobacco: Never  Vaping Use   Vaping Use: Never used  Substance Use Topics   Alcohol use: No   Drug use: No       OPHTHALMIC EXAM: Base Eye Exam     Visual Acuity (Snellen - Linear)       Right Left   Dist cc 20/300 20/60 +2   Dist ph cc NI NI    Correction: Glasses         Tonometry (Tonopen, 9:16 AM)       Right Left    Pressure 17 18         Pupils       Pupils Dark Light Shape React APD   Right PERRL 3 2 Round Slow None   Left PERRL 3 2 Round Slow None         Visual Fields       Left Right    Full    Restrictions  Partial outer superior temporal, inferior temporal deficiencies         Extraocular Movement       Right Left    Full, Ortho Full, Ortho         Neuro/Psych     Oriented x3: Yes   Mood/Affect: Normal         Dilation     Both eyes: 1.0% Mydriacyl, 2.5% Phenylephrine @ 9:12 AM           Slit Lamp and Fundus Exam     Slit Lamp Exam       Right Left   Lids/Lashes Dermatochalasis - upper lid, Meibomian gland dysfunction Dermatochalasis - upper lid, Meibomian gland dysfunction   Conjunctiva/Sclera Melanosis Melanosis   Cornea Arcus, 1-2+ Punctate epithelial erosions, tear film debris, 1+ [place] Arcus, Inferior 1+ Punctate epithelial erosions, well healed cataract wound   Anterior Chamber Deep and quiet Deep;  no cell or flare   Iris Round and dilated, No NVI Round and dilated, No NVI   Lens PC IOL in good position, 2+ Posterior capsular opacification Posterior chamber intraocular lens in good position, 1-2+ PCO.   Anterior Vitreous Vitreous syneresis Vitreous syneresis         Fundus Exam       Right Left   Disc Sharp rim, inferior notch, 3+ pallor, inf. Rim thinning, attenuated vessels superiorly, +disc heme at 1100--persistent, fine vascular loops superiorly 2+ pallor, Sharp rim, thin inferior rim   C/D Ratio 0.85 0.75   Macula Blunted foveal reflex, edema superior mac -- stably improved; stable improvement in DBH superior mac, central exudates and fibrosis, trace cystic changes Flat, good foveal reflex, mild RPE mottling and clumping, Epiretinal membrane, No heme or edema   Vessels superior BRVO with severe attenuation of ST arterioles, Tortuous attenuated, Tortuous   Periphery Attached, DBH superior hemisphere extended from BRVO--improved Attached,  mild, scattered Reticular degeneration, No heme           Refraction     Wearing Rx       Sphere Cylinder Axis Add   Right -0.75 +1.25 170 +  2.50   Left -0.75 +1.00 003 +2.50    Type: PAL         Wearing Rx #2       Sphere Cylinder Axis Add   Right -0.75 +1.25 170 +2.50   Left -0.75 +1.00 003 +2.50    Type: PAL           IMAGING AND PROCEDURES  Imaging and Procedures for 03/07/18  OCT, Retina - OU - Both Eyes       Right Eye Quality was good. Central Foveal Thickness: 240. Progression has improved. Findings include epiretinal membrane, abnormal foveal contour, no SRF, outer retinal atrophy, subretinal hyper-reflective material, intraretinal hyper-reflective material, intraretinal fluid (Mild interval improvement in central cystic changes, central SRHM and patchy ORA; diffuse retinal thinning--stable ).   Left Eye Quality was good. Central Foveal Thickness: 252. Progression has been stable. Findings include abnormal foveal contour, epiretinal membrane, no IRF, no SRF, vitreomacular adhesion (Blunted foveal depression -- stable).   Notes *Images captured and stored on drive  Diagnosis / Impression:  ERM OU OD:  Mild interval improvement in central cystic changes, central SRHM and patchy ORA; diffuse retinal thinning--stable.  OS: very mild ERM with blunted foveal depression -- stable; mild VMA  Clinical management:  See below  Abbreviations: NFP - Normal foveal profile. CME - cystoid macular edema. PED - pigment epithelial detachment. IRF - intraretinal fluid. SRF - subretinal fluid. EZ - ellipsoid zone. ERM - epiretinal membrane. ORA - outer retinal atrophy. ORT - outer retinal tubulation. SRHM - subretinal hyper-reflective material       Intravitreal Injection, Pharmacologic Agent - OD - Right Eye       Time Out 05/15/2022. 10:30 AM. Confirmed correct patient, procedure, site, and patient consented.   Anesthesia Topical anesthesia was used.  Anesthetic medications included Lidocaine 2%, Proparacaine 0.5%.   Procedure Preparation included 5% betadine to ocular surface, eyelid speculum. A (32g) needle was used.   Injection: 1.25 mg Bevacizumab 1.93m/0.05ml   Route: Intravitreal, Site: Right Eye   NDC: 50242-060-01, Lot:: [id number] Expiration date: 07/12/2022   Post-op Post injection exam found visual acuity of at least counting fingers. The patient tolerated the procedure well. There were no complications. The patient received written and verbal post procedure care education. Post injection medications were not given.            ASSESSMENT/PLAN:   ICD-10-CM   1. Branch retinal vein occlusion of right eye with macular edema  H34.8310 OCT, Retina - OU - Both Eyes    Intravitreal Injection, Pharmacologic Agent - OD - Right Eye    Bevacizumab (AVASTIN) SOLN 1.25 mg    2. Diabetes mellitus type 2 without retinopathy ([place]  E11.9     3. Epiretinal membrane (ERM) of both eyes  H35.373     4. Pseudophakia of both eyes  Z96.1     5. Ocular hypertension of right eye  H40.051     6. Glaucoma suspect of both eyes  H40.003     7. Essential hypertension  I10     8. Hypertensive retinopathy of both eyes  H35.033      1. BRVO with CME OD  - lost to follow up from 02.22.22 to 11.1.22 -- re-presented with massive CME and BCVA 20/400 from 20/30  - initial OCT w/ CME superior macula  - s/p IVA OD #1 (10.18.19), #2 (11.19.19), #3 (12.17.19), #4 (02.19.20), #5 (03.23.20), #6 (04.21.20), #7 (05.27.20), #8 (07.07.20), #9 (08.25.20), #10 (9.29.20), #11 (11.17.20), #  12 (12.22.20), #13 (01.27.21), #14 (03.10.21), #15 (04.21.21), #16 (05.26.21), #17 (06.30.21), #18 (08.04.21), #19 (09.01.21), #20 (10.06.21), #21 (11.17.21), #22 (12.30.21), #23 (2.22.22), #24 (11.1.22), #25 (11.29.22), #26 (01.03.23), #27 (02.14.23), #28 (04.07.23)  - good response to medications, but held IVA in January 2020 due to cataract surgery  - BCVA OD 20/300 --  decreased, limited by central atrophy  - OCT at 6 wks shows Mild interval improvement in central cystic changes, central SRHM and patchy ORA; diffuse retinal thinning--stable  - recommend IVA OD #29 today, 05.19.23  - RBA of procedure discussed, questions answered - informed consent obtained and signed - see procedure note  - Avastin informed consent form re-signed and scanned on 05.19.23  - history of worsening IRF/cystic changes at 6+ weeks interval  - 6 weeks for -- DFE/OCT, possible injection   2. Diabetes mellitus, type 2 without retinopathy  - The incidence, risk factors for progression, natural history and treatment options for diabetic retinopathy  were discussed with patient.     - The need for close monitoring of blood glucose, blood pressure, and serum lipids, avoiding cigarette or any type of tobacco, and the need for long term follow up was also discussed with patient.    3. Epiretinal membrane, OU  - relatively mild ERM OU -- blunted central foveal depression  - OD with mild cystic changes 2/2 BRVO as above; OS without cystic changes or edema  - discussed findings and prognosis   - monitor   4. Pseudophakia OU  - s/p CE/IOL OS 11.13.19 w/ Dr. [name]  - s/p CE/IOL OD 01.16.20 w/ Dr. [name]  - beautiful surgeries -- IOLs in perfect position  - healing well post-operatively  - IRF/CME OD may have been partly due to post op CME ([place]) in addition to BRVO   5,6. Ocular hypertension / Glaucoma suspect OD>OS  - IOP ~25 OD, 20 OS at prior clinic visits  - today IOP 17,18  - denies any family hx of glaucoma   - uses Cosopt BID OU  - Dr. Lucianne Lei added brimonidine BID OU  7,8. Hypertensive retinopathy OU  - pt presented to 6.24.2020 visit urgently for right sided headache above right eye with extension to occiput  - BP at that time was 201/100 -- pt was sent to primary care clinic for urgent evaluation and meds were adjusted  - BP now under better control and headaches  improved  - BP reading 11.1.22 -- 180/84 -- advised f/u with PCP  - discussed importance of tight BP control   Ophthalmic Meds Ordered this visit:  Meds ordered this encounter  Medications   Bevacizumab (AVASTIN) SOLN 1.25 mg     Return in about 6 weeks (around 06/26/2022) for f/u BRVO OD, DFE, FA.  There are no Patient Instructions on file for this visit.  This document serves as a record of services personally performed by Gardiner Sleeper, MD, PhD. It was created on their behalf by Leonie Douglas, an ophthalmic technician. The creation of this record is the provider's dictation and/or activities during the visit.    Electronically signed by: Leonie Douglas COA, 05/15/22  1:07 PM  Gardiner Sleeper, M.D., Ph.D. Diseases & Surgery of the Retina and Vitreous [facility]  I have reviewed the above documentation for accuracy and completeness, and I agree with the above. Gardiner Sleeper, M.D., Ph.D. 05/15/22 1:10 PM   Abbreviations: M myopia (nearsighted); A astigmatism; H hyperopia (farsighted); P presbyopia; Mrx spectacle  prescription;  CTL contact lenses; OD right eye; OS left eye; OU both eyes  XT exotropia; ET esotropia; PEK punctate epithelial keratitis; PEE punctate epithelial erosions; DES dry eye syndrome; MGD meibomian gland dysfunction; ATs artificial tears; PFAT's preservative free artificial tears; [place] nuclear sclerotic cataract; PSC posterior subcapsular cataract; ERM epi-retinal membrane; PVD posterior vitreous detachment; RD retinal detachment; DM diabetes mellitus; DR diabetic retinopathy; NPDR non-proliferative diabetic retinopathy; PDR proliferative diabetic retinopathy; CSME clinically significant macular edema; DME diabetic macular edema; dbh dot blot hemorrhages; CWS cotton wool spot; POAG primary open angle glaucoma; C/D cup-to-disc ratio; HVF humphrey visual field; GVF goldmann visual field; OCT optical coherence tomography; IOP intraocular pressure; BRVO  Branch retinal vein occlusion; CRVO central retinal vein occlusion; CRAO central retinal artery occlusion; BRAO branch retinal artery occlusion; RT retinal tear; SB scleral buckle; PPV pars plana vitrectomy; VH Vitreous hemorrhage; PRP panretinal laser photocoagulation; IVK intravitreal kenalog; VMT vitreomacular traction; MH Macular hole;  NVD neovascularization of the disc; NVE neovascularization elsewhere; AREDS age related eye disease study; ARMD age related macular degeneration; POAG primary open angle glaucoma; EBMD epithelial/anterior basement membrane dystrophy; ACIOL anterior chamber intraocular lens; IOL intraocular lens; PCIOL posterior chamber intraocular lens; Phaco/IOL phacoemulsification with intraocular lens placement; [place] photorefractive keratectomy; LASIK laser assisted in situ keratomileusis; HTN hypertension; DM diabetes mellitus; COPD chronic obstructive pulmonary disease

## 2022-05-15 ENCOUNTER — Ambulatory Visit (INDEPENDENT_AMBULATORY_CARE_PROVIDER_SITE_OTHER): Payer: Medicare PPO | Admitting: Ophthalmology

## 2022-05-15 ENCOUNTER — Encounter (INDEPENDENT_AMBULATORY_CARE_PROVIDER_SITE_OTHER): Payer: Self-pay | Admitting: Ophthalmology

## 2022-05-15 DIAGNOSIS — E119 Type 2 diabetes mellitus without complications: Secondary | ICD-10-CM

## 2022-05-15 DIAGNOSIS — H35373 Puckering of macula, bilateral: Secondary | ICD-10-CM

## 2022-05-15 DIAGNOSIS — Z961 Presence of intraocular lens: Secondary | ICD-10-CM

## 2022-05-15 DIAGNOSIS — H40003 Preglaucoma, unspecified, bilateral: Secondary | ICD-10-CM | POA: Diagnosis not present

## 2022-05-15 DIAGNOSIS — H35033 Hypertensive retinopathy, bilateral: Secondary | ICD-10-CM | POA: Diagnosis not present

## 2022-05-15 DIAGNOSIS — I10 Essential (primary) hypertension: Secondary | ICD-10-CM | POA: Diagnosis not present

## 2022-05-15 DIAGNOSIS — H34831 Tributary (branch) retinal vein occlusion, right eye, with macular edema: Secondary | ICD-10-CM

## 2022-05-15 DIAGNOSIS — H40051 Ocular hypertension, right eye: Secondary | ICD-10-CM

## 2022-05-15 MED ORDER — BEVACIZUMAB CHEMO INJECTION 1.25MG/0.05ML SYRINGE FOR KALEIDOSCOPE
1.2500 mg | INTRAVITREAL | Status: AC | PRN
  Administered 2022-05-15: 1.25 mg via INTRAVITREAL

## 2022-05-16 ENCOUNTER — Encounter (INDEPENDENT_AMBULATORY_CARE_PROVIDER_SITE_OTHER): Payer: Self-pay | Admitting: Ophthalmology

## 2022-05-19 DIAGNOSIS — I10 Essential (primary) hypertension: Secondary | ICD-10-CM | POA: Diagnosis not present

## 2022-05-19 DIAGNOSIS — Z79899 Other long term (current) drug therapy: Secondary | ICD-10-CM | POA: Diagnosis not present

## 2022-05-19 DIAGNOSIS — E039 Hypothyroidism, unspecified: Secondary | ICD-10-CM | POA: Diagnosis not present

## 2022-05-19 DIAGNOSIS — N1831 Chronic kidney disease, stage 3a: Secondary | ICD-10-CM | POA: Diagnosis not present

## 2022-05-19 DIAGNOSIS — E1169 Type 2 diabetes mellitus with other specified complication: Secondary | ICD-10-CM | POA: Diagnosis not present

## 2022-05-27 DIAGNOSIS — H401133 Primary open-angle glaucoma, bilateral, severe stage: Secondary | ICD-10-CM | POA: Diagnosis not present

## 2022-05-27 DIAGNOSIS — E113493 Type 2 diabetes mellitus with severe nonproliferative diabetic retinopathy without macular edema, bilateral: Secondary | ICD-10-CM | POA: Diagnosis not present

## 2022-06-08 DIAGNOSIS — H26491 Other secondary cataract, right eye: Secondary | ICD-10-CM | POA: Diagnosis not present

## 2022-06-19 NOTE — Progress Notes (Signed)
[facility] - Clinic Note  06/26/2022     CHIEF COMPLAINT Patient presents for Retina Follow Up  HISTORY OF PRESENT ILLNESS: Alejandra Davis is a 86 y.o. female who presents to the clinic today for:  HPI     Retina Follow Up   Patient presents with  CRVO/BRVO.  In right eye.  Severity is moderate.  Duration of 6 weeks.  Since onset it is stable.  I, the attending physician,  performed the HPI with the patient and updated documentation appropriately.        Comments   Pt here for 6 wk ret fu BRVO OD. Pt states VA is the same, no changes she can tell.       Last edited by Thompson Grayer, COT on 06/26/2022  9:16 AM.       Referring physician: Moshe Cipro, NP [address] [place],  [place] [address]  HISTORICAL INFORMATION:   Selected notes from the MEDICAL RECORD NUMBER Referred by Dr. Joylene Igo for DM exam   CURRENT MEDICATIONS: Current Outpatient Medications (Ophthalmic Drugs)  Medication Sig   dorzolamide-timolol (COSOPT) 22.3-6.8 MG/ML ophthalmic solution Place 1 drop into both eyes 2 (two) times daily.   No current facility-administered medications for this visit. (Ophthalmic Drugs)   Current Outpatient Medications (Other)  Medication Sig   amLODipine (NORVASC) 10 MG tablet TAKE 1 TABLET(10 MG) BY MOUTH DAILY   ASPIRIN LOW DOSE 81 MG EC tablet Take 81 mg by mouth daily.   Lancets Ultra Thin 30G MISC 100 each by Does not apply route 3 (three) times a week.   levothyroxine (SYNTHROID) 75 MCG tablet Take 1 tablet (75 mcg total) by mouth daily.   lisinopril (ZESTRIL) 2.5 MG tablet Take 1 tablet (2.5 mg total) by mouth daily.   loratadine (CLARITIN) 10 MG tablet TAKE 1 TABLET(10 MG) BY MOUTH DAILY   memantine (NAMENDA XR) 14 MG CP24 24 hr capsule Take 1 capsule (14 mg total) by mouth daily.   metoprolol tartrate (LOPRESSOR) 50 MG tablet TAKE 1 TABLET(50 MG) BY MOUTH DAILY   No current facility-administered medications for this  visit. (Other)   REVIEW OF SYSTEMS: ROS   Positive for: Skin, Genitourinary, Musculoskeletal, Endocrine, Cardiovascular, Eyes Negative for: Constitutional, Gastrointestinal, Neurological, HENT, Respiratory, Psychiatric, Allergic/Imm, Heme/Lymph Last edited by Thompson Grayer, COT on 06/26/2022  9:16 AM.      ALLERGIES Allergies  Allergen Reactions   Donepezil Hcl     Stomach cramps    PAST MEDICAL HISTORY Past Medical History:  Diagnosis Date   Chicken pox    Cystocele    Diabetes mellitus without complication (HCC)    Glaucoma    Hypertension    Hypertensive retinopathy    OU   Neuromuscular disorder (HCC)    Dementia    Thyroid disease    hypothyroidism   Past Surgical History:  Procedure Laterality Date   ABDOMINAL HYSTERECTOMY  1990   TAH BSO   CATARACT EXTRACTION Bilateral    Dr. Alben Spittle   COLONOSCOPY WITH PROPOFOL N/A 04/08/2021   Procedure: COLONOSCOPY WITH PROPOFOL;  Surgeon: Charlott Rakes, MD;  Location: [facility];  Service: Endoscopy;  Laterality: N/A;   EYE SURGERY Bilateral    Cat Sx OU   OOPHORECTOMY     BSO   Vaginal Bx     Papilloma   FAMILY HISTORY Family History  Problem Relation Age of Onset   Sickle cell anemia Other    Hypertension Mother  Cancer Father        LIVER   Heart disease Brother    Cancer Brother        STOMACH   SOCIAL HISTORY Social History   Tobacco Use   Smoking status: Never   Smokeless tobacco: Never  Vaping Use   Vaping Use: Never used  Substance Use Topics   Alcohol use: No   Drug use: No       OPHTHALMIC EXAM: Base Eye Exam     Visual Acuity (Snellen - Linear)       Right Left   Dist cc 20/300 -1 20/70 +2   Dist ph cc  20/40         Tonometry (Tonopen, 9:24 AM)       Right Left   Pressure 14 14         Pupils       Pupils Dark Light Shape React APD   Right PERRL 3 2 Round Brisk None   Left PERRL 3 2 Round Brisk None         Visual Fields       Left Right    Full     Restrictions  Partial outer superior temporal, inferior temporal deficiencies         Extraocular Movement       Right Left    Full, Ortho Full, Ortho         Neuro/Psych     Oriented x3: Yes   Mood/Affect: Normal         Dilation   Pt dilated in exam room MS          Slit Lamp and Fundus Exam     Slit Lamp Exam       Right Left   Lids/Lashes Dermatochalasis - upper lid, Meibomian gland dysfunction Dermatochalasis - upper lid, Meibomian gland dysfunction   Conjunctiva/Sclera Melanosis Melanosis   Cornea Arcus, trace Punctate epithelial erosions, tear film debris, 1+ [place], Well healed temporal cataract wound Arcus, Inferior 1+ Punctate epithelial erosions, well healed cataract wound   Anterior Chamber Deep and quiet Deep;  no cell or flare   Iris Round and dilated, No NVI Round and dilated, No NVI   Lens PC IOL in good position, 2+ Posterior capsular opacification Posterior chamber intraocular lens in good position, 1-2+ PCO.   Anterior Vitreous Vitreous syneresis Vitreous syneresis, Vitreous condensations         Fundus Exam       Right Left   Disc Sharp rim, inferior notch, 3+ pallor, inf. Rim thinning, attenuated vessels superiorly, +disc heme at 1100--persistent, fine vascular loops superiorly 2+ pallor, Sharp rim, thin inferior rim   C/D Ratio 0.85 0.75   Macula Blunted foveal reflex, edema/cystic changes superior mac; stable improvement in DBH superior mac, central exudates and fibrosis Flat, good foveal reflex, mild RPE mottling and clumping, Epiretinal membrane, No heme or edema   Vessels superior BRVO with severe attenuation of ST arterioles, Tortuous attenuated, Tortuous   Periphery Attached, DBH superior hemisphere extended from BRVO--improved Attached, mild, scattered Reticular degeneration, No heme           IMAGING AND PROCEDURES  Imaging and Procedures for 03/07/18  OCT, Retina - OU - Both Eyes       Right Eye Quality was good.  Central Foveal Thickness: 242. Progression has worsened. Findings include no SRF, abnormal foveal contour, subretinal hyper-reflective material, intraretinal hyper-reflective material, epiretinal membrane, intraretinal fluid, outer retinal atrophy, vitreomacular adhesion (Mild  interval increase in IRF/central cystic changes, central SRHM and patchy ORA; diffuse retinal thinning--stable ).   Left Eye Quality was good. Central Foveal Thickness: 251. Progression has been stable. Findings include no IRF, no SRF, abnormal foveal contour, epiretinal membrane, vitreomacular adhesion (Blunted foveal depression -- stable).   Notes *Images captured and stored on drive  Diagnosis / Impression:  ERM OU OD: Mild interval increase in IRF/central cystic changes, central SRHM and patchy ORA; diffuse retinal thinning--stable OS: very mild ERM with blunted foveal depression -- stable; mild VMA  Clinical management:  See below  Abbreviations: NFP - Normal foveal profile. CME - cystoid macular edema. PED - pigment epithelial detachment. IRF - intraretinal fluid. SRF - subretinal fluid. EZ - ellipsoid zone. ERM - epiretinal membrane. ORA - outer retinal atrophy. ORT - outer retinal tubulation. SRHM - subretinal hyper-reflective material       Intravitreal Injection, Pharmacologic Agent - OD - Right Eye       Time Out 06/26/2022. 10:26 AM. Confirmed correct patient, procedure, site, and patient consented.   Anesthesia Topical anesthesia was used. Anesthetic medications included Lidocaine 2%, Proparacaine 0.5%.   Procedure Preparation included 5% betadine to ocular surface, eyelid speculum. A (32g) needle was used.   Injection: 1.25 mg Bevacizumab 1.25mg /0.53ml   Route: Intravitreal, Site: Right Eye   NDC: P3213405, Lot: [id number], Expiration date: 08/24/2022   Post-op Post injection exam found visual acuity of at least counting fingers. The patient tolerated the procedure well. There were no  complications. The patient received written and verbal post procedure care education. Post injection medications were not given.            ASSESSMENT/PLAN:   ICD-10-CM   1. Branch retinal vein occlusion of right eye with macular edema  H34.8310 OCT, Retina - OU - Both Eyes    Intravitreal Injection, Pharmacologic Agent - OD - Right Eye    Bevacizumab (AVASTIN) SOLN 1.25 mg    2. Diabetes mellitus type 2 without retinopathy (HCC)  E11.9     3. Epiretinal membrane (ERM) of both eyes  H35.373     4. Pseudophakia of both eyes  Z96.1     5. Ocular hypertension of right eye  H40.051     6. Glaucoma suspect of both eyes  H40.003     7. Essential hypertension  I10     8. Hypertensive retinopathy of both eyes  H35.033      1. BRVO with CME OD  - lost to follow up from 02.22.22 to 11.1.22 -- re-presented with massive CME and BCVA 20/400 from 20/30  - initial OCT w/ CME superior macula  - s/p IVA OD #1 (10.18.19), #2 (11.19.19), #3 (12.17.19), #4 (02.19.20), #5 (03.23.20), #6 (04.21.20), #7 (05.27.20), #8 (07.07.20), #9 (08.25.20), #10 (9.29.20), #11 (11.17.20), #12 (12.22.20), #13 (01.27.21), #14 (03.10.21), #15 (04.21.21), #16 (05.26.21), #17 (06.30.21), #18 (08.04.21), #19 (09.01.21), #20 (10.06.21), #21 (11.17.21), #22 (12.30.21), #23 (2.22.22), #24 (11.1.22), #25 (11.29.22), #26 (01.03.23), #27 (02.14.23), #28 (04.07.23), #29 (05.19.23)  - BCVA OD 20/300 -- limited by central atrophy  - OCT at 6 wks shows Mild interval increase in IRF/central cystic changes, central SRHM and patchy ORA; diffuse retinal thinning--stable  - recommend IVA OD #30 today, 06.30.23  - discussed IVA resistance and possibility of switching meds next visit (will need prior authorization for Eylea)   - RBA of procedure discussed, questions answered - informed consent obtained and signed - see procedure note  - Avastin informed consent form re-signed and  scanned on 05.19.23  - history of worsening IRF/cystic  changes at 6+ weeks interval  - 6 weeks for -- DFE/OCT, possible injection   2. Diabetes mellitus, type 2 without retinopathy  - The incidence, risk factors for progression, natural history and treatment options for diabetic retinopathy  were discussed with patient.     - The need for close monitoring of blood glucose, blood pressure, and serum lipids, avoiding cigarette or any type of tobacco, and the need for long term follow up was also discussed with patient.    3. Epiretinal membrane, OU  - relatively mild ERM OU -- blunted central foveal depression  - OD with mild cystic changes 2/2 BRVO as above; OS without cystic changes or edema  - discussed findings and prognosis   - monitor   4. Pseudophakia OU  - s/p CE/IOL OS 11.13.19 w/ Dr. Alben Spittle  - s/p CE/IOL OD 01.16.20 w/ Dr. Alben Spittle  - beautiful surgeries -- IOLs in perfect position  - healing well post-operatively  - IRF/CME OD may have been partly due to post op CME ([place]) in addition to BRVO  5,6. Ocular hypertension / Glaucoma suspect OD>OS  - IOP ~25 OD, 20 OS at prior clinic visits  - today IOP 14,14  - denies any family hx of glaucoma   - on Cosopt and brimonidine BID OU per Dr. Zenaida Niece  7,8. Hypertensive retinopathy OU  - pt presented to 6.24.2020 visit urgently for right sided headache above right eye with extension to occiput  - BP at that time was 201/100 -- pt was sent to primary care clinic for urgent evaluation and meds were adjusted  - BP now under better control and headaches improved  - BP reading 11.1.22 -- 180/84 -- advised f/u with PCP  - discussed importance of tight BP control   Ophthalmic Meds Ordered this visit:  Meds ordered this encounter  Medications   Bevacizumab (AVASTIN) SOLN 1.25 mg     Return in about 6 weeks (around 08/07/2022) for DFE, OCT, possible injection.  There are no Patient Instructions on file for this visit.  This document serves as a record of services personally performed by  Karie Chimera, MD, PhD. It was created on their behalf by Joni Reining, an ophthalmic technician. The creation of this record is the provider's dictation and/or activities during the visit.    Electronically signed by: Joni Reining COA, 06/28/22  2:33 AM  Karie Chimera, M.D., Ph.D. Diseases & Surgery of the Retina and Vitreous [facility]  I have reviewed the above documentation for accuracy and completeness, and I agree with the above. Karie Chimera, M.D., Ph.D. 06/28/22 2:33 AM   Abbreviations: M myopia (nearsighted); A astigmatism; H hyperopia (farsighted); P presbyopia; Mrx spectacle prescription;  CTL contact lenses; OD right eye; OS left eye; OU both eyes  XT exotropia; ET esotropia; PEK punctate epithelial keratitis; PEE punctate epithelial erosions; DES dry eye syndrome; MGD meibomian gland dysfunction; ATs artificial tears; PFAT's preservative free artificial tears; NSC nuclear sclerotic cataract; PSC posterior subcapsular cataract; ERM epi-retinal membrane; PVD posterior vitreous detachment; RD retinal detachment; DM diabetes mellitus; DR diabetic retinopathy; NPDR non-proliferative diabetic retinopathy; PDR proliferative diabetic retinopathy; CSME clinically significant macular edema; DME diabetic macular edema; dbh dot blot hemorrhages; CWS cotton wool spot; POAG primary open angle glaucoma; C/D cup-to-disc ratio; HVF humphrey visual field; GVF goldmann visual field; OCT optical coherence tomography; IOP intraocular pressure; BRVO Branch retinal vein occlusion; CRVO central  retinal vein occlusion; CRAO central retinal artery occlusion; BRAO branch retinal artery occlusion; RT retinal tear; SB scleral buckle; PPV pars plana vitrectomy; VH Vitreous hemorrhage; PRP panretinal laser photocoagulation; IVK intravitreal kenalog; VMT vitreomacular traction; MH Macular hole;  NVD neovascularization of the disc; NVE neovascularization elsewhere; AREDS age related eye  disease study; ARMD age related macular degeneration; POAG primary open angle glaucoma; EBMD epithelial/anterior basement membrane dystrophy; ACIOL anterior chamber intraocular lens; IOL intraocular lens; PCIOL posterior chamber intraocular lens; Phaco/IOL phacoemulsification with intraocular lens placement; PRK photorefractive keratectomy; LASIK laser assisted in situ keratomileusis; HTN hypertension; DM diabetes mellitus; COPD chronic obstructive pulmonary disease

## 2022-06-26 ENCOUNTER — Encounter (INDEPENDENT_AMBULATORY_CARE_PROVIDER_SITE_OTHER): Payer: Self-pay | Admitting: Ophthalmology

## 2022-06-26 ENCOUNTER — Ambulatory Visit (INDEPENDENT_AMBULATORY_CARE_PROVIDER_SITE_OTHER): Payer: Medicare PPO | Admitting: Ophthalmology

## 2022-06-26 DIAGNOSIS — H40003 Preglaucoma, unspecified, bilateral: Secondary | ICD-10-CM

## 2022-06-26 DIAGNOSIS — Z961 Presence of intraocular lens: Secondary | ICD-10-CM

## 2022-06-26 DIAGNOSIS — I10 Essential (primary) hypertension: Secondary | ICD-10-CM

## 2022-06-26 DIAGNOSIS — H35033 Hypertensive retinopathy, bilateral: Secondary | ICD-10-CM | POA: Diagnosis not present

## 2022-06-26 DIAGNOSIS — E119 Type 2 diabetes mellitus without complications: Secondary | ICD-10-CM

## 2022-06-26 DIAGNOSIS — H34831 Tributary (branch) retinal vein occlusion, right eye, with macular edema: Secondary | ICD-10-CM

## 2022-06-26 DIAGNOSIS — H40051 Ocular hypertension, right eye: Secondary | ICD-10-CM

## 2022-06-26 DIAGNOSIS — H35373 Puckering of macula, bilateral: Secondary | ICD-10-CM | POA: Diagnosis not present

## 2022-06-28 ENCOUNTER — Encounter (INDEPENDENT_AMBULATORY_CARE_PROVIDER_SITE_OTHER): Payer: Self-pay | Admitting: Ophthalmology

## 2022-06-28 MED ORDER — BEVACIZUMAB CHEMO INJECTION 1.25MG/0.05ML SYRINGE FOR KALEIDOSCOPE
1.2500 mg | INTRAVITREAL | Status: AC | PRN
  Administered 2022-06-28: 1.25 mg via INTRAVITREAL

## 2022-07-03 DIAGNOSIS — I739 Peripheral vascular disease, unspecified: Secondary | ICD-10-CM | POA: Diagnosis not present

## 2022-07-03 DIAGNOSIS — L603 Nail dystrophy: Secondary | ICD-10-CM | POA: Diagnosis not present

## 2022-07-03 DIAGNOSIS — B353 Tinea pedis: Secondary | ICD-10-CM | POA: Diagnosis not present

## 2022-07-03 DIAGNOSIS — M79671 Pain in right foot: Secondary | ICD-10-CM | POA: Diagnosis not present

## 2022-07-03 DIAGNOSIS — M79672 Pain in left foot: Secondary | ICD-10-CM | POA: Diagnosis not present

## 2022-07-09 ENCOUNTER — Encounter (INDEPENDENT_AMBULATORY_CARE_PROVIDER_SITE_OTHER): Payer: Self-pay | Admitting: Ophthalmology

## 2022-07-31 DIAGNOSIS — L301 Dyshidrosis [pompholyx]: Secondary | ICD-10-CM | POA: Diagnosis not present

## 2022-08-04 NOTE — Progress Notes (Signed)
[facility] Note  08/07/2022    CHIEF COMPLAINT Patient presents for Retina Follow Up  HISTORY OF PRESENT ILLNESS: Alejandra Davis is a 86 y.o. female who presents to the clinic today for:  HPI     Retina Follow Up   Patient presents with  CRVO/BRVO (IVA OD 06.30.23).  In right eye.  This started months ago.  Severity is moderate.  Duration of 6 weeks.  Since onset it is stable.  I, the attending physician,  performed the HPI with the patient and updated documentation appropriately.        Comments   Patient feels that the vision is the same since her last visit.       Last edited by [name], MD on 08/07/2022 11:44 AM.     Referring physician: Faustino Congress, NP [place],  [place] [address]  HISTORICAL INFORMATION:   Selected notes from the MEDICAL RECORD NUMBER Referred by [name] for DM exam   CURRENT MEDICATIONS: Current Outpatient Medications (Ophthalmic Drugs)  Medication Sig   dorzolamide-timolol (COSOPT) 22.3-6.8 MG/ML ophthalmic solution Place 1 drop into both eyes 2 (two) times daily.   No current facility-administered medications for this visit. (Ophthalmic Drugs)   Current Outpatient Medications (Other)  Medication Sig   amLODipine (NORVASC) 10 MG tablet TAKE 1 TABLET(10 MG) BY MOUTH DAILY   ASPIRIN LOW DOSE 81 MG EC tablet Take 81 mg by mouth daily.   Lancets Ultra Thin 30G MISC 100 each by Does not apply route 3 (three) times a week.   levothyroxine (SYNTHROID) 75 MCG tablet Take 1 tablet (75 mcg total) by mouth daily.   lisinopril (ZESTRIL) 2.5 MG tablet Take 1 tablet (2.5 mg total) by mouth daily.   loratadine (CLARITIN) 10 MG tablet TAKE 1 TABLET(10 MG) BY MOUTH DAILY   memantine (NAMENDA XR) 14 MG CP24 24 hr capsule Take 1 capsule (14 mg total) by mouth daily.   metoprolol tartrate (LOPRESSOR) 50 MG tablet TAKE 1 TABLET(50 MG) BY MOUTH DAILY   No current facility-administered medications for  this visit. (Other)   REVIEW OF SYSTEMS: ROS   Positive for: Skin, Genitourinary, Musculoskeletal, Endocrine, Cardiovascular, Eyes Negative for: Constitutional, Gastrointestinal, Neurological, HENT, Respiratory, Psychiatric, Allergic/Imm, Heme/Lymph Last edited by Annie Paras, COT on 08/07/2022  9:03 AM.     ALLERGIES Allergies  Allergen Reactions   Donepezil Hcl     Stomach cramps    PAST MEDICAL HISTORY Past Medical History:  Diagnosis Date   Chicken pox    Cystocele    Diabetes mellitus without complication ([place])    Glaucoma    Hypertension    Hypertensive retinopathy    OU   Neuromuscular disorder ([place])    Dementia    Thyroid disease    hypothyroidism   Past Surgical History:  Procedure Laterality Date   ABDOMINAL HYSTERECTOMY  1990   TAH BSO   CATARACT EXTRACTION Bilateral    Dr. [name]   COLONOSCOPY WITH PROPOFOL N/A 04/08/2021   Procedure: COLONOSCOPY WITH PROPOFOL;  Surgeon: [name], MD;  Location: [facility];  Service: Endoscopy;  Laterality: N/A;   EYE SURGERY Bilateral    Cat Sx OU   OOPHORECTOMY     BSO   Vaginal Bx     Papilloma   FAMILY HISTORY Family History  Problem Relation Age of Onset   Sickle cell anemia Other    Hypertension Mother    Cancer Father  LIVER   Heart disease Brother    Cancer Brother        STOMACH   SOCIAL HISTORY Social History   Tobacco Use   Smoking status: Never   Smokeless tobacco: Never  Vaping Use   Vaping Use: Never used  Substance Use Topics   Alcohol use: No   Drug use: No       OPHTHALMIC EXAM: Base Eye Exam     Visual Acuity (Snellen - Linear)       Right Left   Dist cc 20/300 20/70   Dist ph cc  20/40    Correction: Glasses         Tonometry (Tonopen, 9:06 AM)       Right Left   Pressure 13 15         Pupils       Dark Light Shape React APD   Right 3 2 Round Brisk None   Left 3 2 Round Brisk None         Visual Fields       Left Right     Full    Restrictions  Partial outer superior temporal, inferior temporal deficiencies         Extraocular Movement       Right Left    Full, Ortho Full, Ortho         Neuro/Psych     Oriented x3: Yes   Mood/Affect: Normal         Dilation     Both eyes: 1.0% Mydriacyl, 2.5% Phenylephrine @ 9:04 AM           Slit Lamp and Fundus Exam     Slit Lamp Exam       Right Left   Lids/Lashes Dermatochalasis - upper lid, Meibomian gland dysfunction Dermatochalasis - upper lid, Meibomian gland dysfunction   Conjunctiva/Sclera Melanosis Melanosis   Cornea Arcus, trace Punctate epithelial erosions, tear film debris, 1+ [place], Well healed temporal cataract wound Arcus, Inferior 1+ Punctate epithelial erosions, well healed cataract wound   Anterior Chamber Deep and quiet Deep;  no cell or flare   Iris Round and dilated, No NVI Round and dilated, No NVI   Lens PC IOL in good position, 2+ Posterior capsular opacification Posterior chamber intraocular lens in good position, 1-2+ PCO.   Anterior Vitreous Vitreous syneresis Vitreous syneresis, Vitreous condensations         Fundus Exam       Right Left   Disc Sharp rim, inferior notch, 3+ pallor, inf. Rim thinning, attenuated vessels superiorly, +disc heme at 1100--persistent, fine vascular loops superiorly 2+ pallor, Sharp rim, thin inferior rim   C/D Ratio 0.85 0.75   Macula Blunted foveal reflex, edema/cystic changes superior mac; stable improvement in DBH superior mac, central exudates, fibrosis and atrophy Flat, good foveal reflex, mild RPE mottling and clumping, Epiretinal membrane, No heme or edema   Vessels superior BRVO with severe attenuation of ST arterioles, Tortuous attenuated, Tortuous   Periphery Attached, DBH superior hemisphere extended from BRVO--slightly improved Attached, mild, scattered Reticular degeneration, No heme           Refraction     Wearing Rx       Sphere Cylinder Axis Add   Right -0.75  +1.25 170 +2.50   Left -0.75 +1.00 003 +2.50    Type: PAL         Wearing Rx #2       Sphere Cylinder Axis Add  Right -0.75 +1.25 170 +2.50   Left -0.75 +1.00 003 +2.50    Type: PAL           IMAGING AND PROCEDURES  Imaging and Procedures for 03/07/18  OCT, Retina - OU - Both Eyes       Right Eye Quality was good. Central Foveal Thickness: 245. Progression has worsened. Findings include no SRF, abnormal foveal contour, subretinal hyper-reflective material, intraretinal hyper-reflective material, epiretinal membrane, intraretinal fluid, outer retinal atrophy, vitreomacular adhesion (Mild interval increase in IRF/central cystic changes, central SRHM and patchy ORA; diffuse retinal thinning--stable ).   Left Eye Quality was good. Central Foveal Thickness: 249. Progression has been stable. Findings include no IRF, no SRF, abnormal foveal contour, epiretinal membrane, vitreomacular adhesion (Blunted foveal depression -- stable).   Notes *Images captured and stored on drive  Diagnosis / Impression:  ERM OU OD: Mild interval increase in IRF/central cystic changes, central SRHM and patchy ORA; diffuse retinal thinning--stable OS: very mild ERM with blunted foveal depression -- stable; mild VMA  Clinical management:  See below  Abbreviations: NFP - Normal foveal profile. CME - cystoid macular edema. PED - pigment epithelial detachment. IRF - intraretinal fluid. SRF - subretinal fluid. EZ - ellipsoid zone. ERM - epiretinal membrane. ORA - outer retinal atrophy. ORT - outer retinal tubulation. SRHM - subretinal hyper-reflective material       Intravitreal Injection, Pharmacologic Agent - OD - Right Eye       Time Out 08/07/2022. 9:18 AM. Confirmed correct patient, procedure, site, and patient consented.   Anesthesia Topical anesthesia was used. Anesthetic medications included Lidocaine 2%, Proparacaine 0.5%.   Procedure Preparation included 5% betadine to ocular  surface, eyelid speculum. A (32g) needle was used.   Injection: 2 mg aflibercept 2 MG/0.05ML   Route: Intravitreal, Site: Right Eye   NDC: A3590391, Lot: [id number], Expiration date: 09/27/2023, Waste: 0 mL   Post-op Post injection exam found visual acuity of at least counting fingers. The patient tolerated the procedure well. There were no complications. The patient received written and verbal post procedure care education. Post injection medications were not given.            ASSESSMENT/PLAN:   ICD-10-CM   1. Branch retinal vein occlusion of right eye with macular edema  H34.8310 OCT, Retina - OU - Both Eyes    Intravitreal Injection, Pharmacologic Agent - OD - Right Eye    aflibercept (EYLEA) SOLN 2 mg    2. Diabetes mellitus type 2 without retinopathy ([place])  E11.9     3. Epiretinal membrane (ERM) of both eyes  H35.373     4. Pseudophakia of both eyes  Z96.1     5. Ocular hypertension of right eye  H40.051     6. Glaucoma suspect of both eyes  H40.003     7. Essential hypertension  I10     8. Hypertensive retinopathy of both eyes  H35.033      1. BRVO with CME OD  - lost to follow up from 02.22.22 to 11.1.22 -- re-presented with massive CME and BCVA 20/400 from 20/30  - initial OCT w/ CME superior macula  - s/p IVA OD #1 (10.18.19), #2 (11.19.19), #3 (12.17.19), #4 (02.19.20), #5 (03.23.20), #6 (04.21.20), #7 (05.27.20), #8 (07.07.20), #9 (08.25.20), #10 (9.29.20), #11 (11.17.20), #12 (12.22.20), #13 (01.27.21), #14 (03.10.21), #15 (04.21.21), #16 (05.26.21), #17 (06.30.21), #18 (08.04.21), #19 (09.01.21), #20 (10.06.21), #21 (11.17.21), #22 (12.30.21), #23 (2.22.22), #24 (11.1.22), #25 (11.29.22), #26 (01.03.23), #27 (02.14.23), #  28 (04.07.23), #29 (05.19.23), #30 (06.20.23)  **history of worsening IRF/cystic changes at 6+ weeks interval, IVA**  - BCVA OD 20/300 -- limited by central atrophy  - OCT shows Mild interval increase in IRF/central cystic changes, central  SRHM and patchy ORA; diffuse retinal thinning--stable at 6 weeks  - discussed IVA resistance and possible switch in medication  - recommend IVE OD #1 today, 08.11.23   - RBA of procedure discussed, questions answered - informed consent obtained and signed - see procedure note  - [place] informed consent form re-signed and scanned on 05.19.23  - Eylea informed consent form signed and scanned on 08.11.23  - f/u 4 weeks -- DFE/OCT, possible injection   2. Diabetes mellitus, type 2 without retinopathy  - The incidence, risk factors for progression, natural history and treatment options for diabetic retinopathy  were discussed with patient.     - The need for close monitoring of blood glucose, blood pressure, and serum lipids, avoiding cigarette or any type of tobacco, and the need for long term follow up was also discussed with patient.    3. Epiretinal membrane, OU  - relatively mild ERM OU -- blunted central foveal depression  - OD with mild cystic changes 2/2 BRVO as above; OS without cystic changes or edema  - discussed findings and prognosis   - monitor   4. Pseudophakia OU  - s/p CE/IOL OS 11.13.19 w/ Dr. [name]  - s/p CE/IOL OD 01.16.20 w/ Dr. [name]  - beautiful surgeries -- IOLs in perfect position  - healing well post-operatively  - IRF/CME OD may have been partly due to post op CME ([place]) in addition to BRVO  5,6. Ocular hypertension / Glaucoma suspect OD>OS  - IOP ~25 OD, 20 OS at prior clinic visits  - today IOP 13,15  - denies any family hx of glaucoma   - on Cosopt and brimonidine BID OU per Dr. Lucianne Lei  7,8. Hypertensive retinopathy OU  - pt presented to 6.24.2020 visit urgently for right sided headache above right eye with extension to occiput  - BP at that time was 201/100 -- pt was sent to primary care clinic for urgent evaluation and meds were adjusted  - BP now under better control and headaches improved  - BP reading 11.1.22 -- 180/84 -- advised f/u with  PCP  - discussed importance of tight BP control   Ophthalmic Meds Ordered this visit:  Meds ordered this encounter  Medications   aflibercept (EYLEA) SOLN 2 mg     Return in about 4 weeks (around 09/04/2022) for f/u 6 weeks, RVO OD, DFE, OCT.  There are no Patient Instructions on file for this visit.  This document serves as a record of services personally performed by Gardiner Sleeper, MD, PhD. It was created on their behalf by San Jetty. Owens Shark, OA an ophthalmic technician. The creation of this record is the provider's dictation and/or activities during the visit.    Electronically signed by: San Jetty. Marguerita Merles 08.08.2023 12:13 PM  Gardiner Sleeper, M.D., Ph.D. Diseases & Surgery of the Retina and Vitreous [facility]  I have reviewed the above documentation for accuracy and completeness, and I agree with the above. Gardiner Sleeper, M.D., Ph.D. 08/07/22 12:20 PM   Abbreviations: M myopia (nearsighted); A astigmatism; H hyperopia (farsighted); P presbyopia; Mrx spectacle prescription;  CTL contact lenses; OD right eye; OS left eye; OU both eyes  XT exotropia; ET esotropia; PEK punctate epithelial keratitis;  PEE punctate epithelial erosions; DES dry eye syndrome; MGD meibomian gland dysfunction; ATs artificial tears; PFAT's preservative free artificial tears; [place] nuclear sclerotic cataract; PSC posterior subcapsular cataract; ERM epi-retinal membrane; PVD posterior vitreous detachment; RD retinal detachment; DM diabetes mellitus; DR diabetic retinopathy; NPDR non-proliferative diabetic retinopathy; PDR proliferative diabetic retinopathy; CSME clinically significant macular edema; DME diabetic macular edema; dbh dot blot hemorrhages; CWS cotton wool spot; POAG primary open angle glaucoma; C/D cup-to-disc ratio; HVF humphrey visual field; GVF goldmann visual field; OCT optical coherence tomography; IOP intraocular pressure; BRVO Branch retinal vein occlusion; CRVO central  retinal vein occlusion; CRAO central retinal artery occlusion; BRAO branch retinal artery occlusion; RT retinal tear; SB scleral buckle; PPV pars plana vitrectomy; VH Vitreous hemorrhage; PRP panretinal laser photocoagulation; IVK intravitreal kenalog; VMT vitreomacular traction; MH Macular hole;  NVD neovascularization of the disc; NVE neovascularization elsewhere; AREDS age related eye disease study; ARMD age related macular degeneration; POAG primary open angle glaucoma; EBMD epithelial/anterior basement membrane dystrophy; ACIOL anterior chamber intraocular lens; IOL intraocular lens; PCIOL posterior chamber intraocular lens; Phaco/IOL phacoemulsification with intraocular lens placement; [place] photorefractive keratectomy; LASIK laser assisted in situ keratomileusis; HTN hypertension; DM diabetes mellitus; COPD chronic obstructive pulmonary disease

## 2022-08-07 ENCOUNTER — Ambulatory Visit (INDEPENDENT_AMBULATORY_CARE_PROVIDER_SITE_OTHER): Payer: Medicare PPO | Admitting: Ophthalmology

## 2022-08-07 ENCOUNTER — Encounter (INDEPENDENT_AMBULATORY_CARE_PROVIDER_SITE_OTHER): Payer: Self-pay | Admitting: Ophthalmology

## 2022-08-07 DIAGNOSIS — I10 Essential (primary) hypertension: Secondary | ICD-10-CM

## 2022-08-07 DIAGNOSIS — H40003 Preglaucoma, unspecified, bilateral: Secondary | ICD-10-CM

## 2022-08-07 DIAGNOSIS — H35373 Puckering of macula, bilateral: Secondary | ICD-10-CM

## 2022-08-07 DIAGNOSIS — H40051 Ocular hypertension, right eye: Secondary | ICD-10-CM

## 2022-08-07 DIAGNOSIS — E119 Type 2 diabetes mellitus without complications: Secondary | ICD-10-CM

## 2022-08-07 DIAGNOSIS — H35033 Hypertensive retinopathy, bilateral: Secondary | ICD-10-CM

## 2022-08-07 DIAGNOSIS — H34831 Tributary (branch) retinal vein occlusion, right eye, with macular edema: Secondary | ICD-10-CM | POA: Diagnosis not present

## 2022-08-07 DIAGNOSIS — Z961 Presence of intraocular lens: Secondary | ICD-10-CM

## 2022-08-07 MED ORDER — AFLIBERCEPT 2MG/0.05ML IZ SOLN FOR KALEIDOSCOPE
2.0000 mg | INTRAVITREAL | Status: AC | PRN
  Administered 2022-08-07: 2 mg via INTRAVITREAL

## 2022-08-17 ENCOUNTER — Encounter (INDEPENDENT_AMBULATORY_CARE_PROVIDER_SITE_OTHER): Payer: Self-pay | Admitting: Ophthalmology

## 2022-08-21 DIAGNOSIS — L301 Dyshidrosis [pompholyx]: Secondary | ICD-10-CM | POA: Diagnosis not present

## 2022-08-27 DIAGNOSIS — L219 Seborrheic dermatitis, unspecified: Secondary | ICD-10-CM | POA: Insufficient documentation

## 2022-08-27 NOTE — Progress Notes (Addendum)
[facility] Note  09/04/2022    CHIEF COMPLAINT Patient presents for Retina Follow Up  HISTORY OF PRESENT ILLNESS: Alejandra Davis is a 86 y.o. female who presents to the clinic today for:  HPI     Retina Follow Up   Patient presents with  CRVO/BRVO (IVA OD 06.30.23).  In right eye.  This started months ago.  Severity is moderate.  Duration of 6 weeks.  Since onset it is stable.  I, the attending physician,  performed the HPI with the patient and updated documentation appropriately.        Comments   Patient feels that the vision is fuzzy when she is rushing around.       Last edited by [name], MD on 09/04/2022  4:14 PM.    Pt feels vision might be slightly improved  Referring physician: Faustino Congress, NP [place],  [place] [id number]  HISTORICAL INFORMATION:   Selected notes from the MEDICAL RECORD NUMBER Referred by [name] for DM exam   CURRENT MEDICATIONS: Current Outpatient Medications (Ophthalmic Drugs)  Medication Sig   dorzolamide-timolol (COSOPT) 22.3-6.8 MG/ML ophthalmic solution Place 1 drop into both eyes 2 (two) times daily.   No current facility-administered medications for this visit. (Ophthalmic Drugs)   Current Outpatient Medications (Other)  Medication Sig   amLODipine (NORVASC) 10 MG tablet TAKE 1 TABLET(10 MG) BY MOUTH DAILY   ASPIRIN LOW DOSE 81 MG EC tablet Take 81 mg by mouth daily.   Lancets Ultra Thin 30G MISC 100 each by Does not apply route 3 (three) times a week.   levothyroxine (SYNTHROID) 75 MCG tablet Take 1 tablet (75 mcg total) by mouth daily.   lisinopril (ZESTRIL) 2.5 MG tablet Take 1 tablet (2.5 mg total) by mouth daily.   loratadine (CLARITIN) 10 MG tablet TAKE 1 TABLET(10 MG) BY MOUTH DAILY   memantine (NAMENDA XR) 14 MG CP24 24 hr capsule Take 1 capsule (14 mg total) by mouth daily.   metoprolol tartrate (LOPRESSOR) 50 MG tablet TAKE 1 TABLET(50 MG) BY MOUTH DAILY   No  current facility-administered medications for this visit. (Other)   REVIEW OF SYSTEMS: ROS   Positive for: Skin, Genitourinary, Musculoskeletal, Endocrine, Cardiovascular, Eyes Negative for: Constitutional, Gastrointestinal, Neurological, HENT, Respiratory, Psychiatric, Allergic/Imm, Heme/Lymph Last edited by Annie Paras, COT on 09/04/2022  9:29 AM.      ALLERGIES Allergies  Allergen Reactions   Donepezil Hcl     Stomach cramps    PAST MEDICAL HISTORY Past Medical History:  Diagnosis Date   Chicken pox    Cystocele    Diabetes mellitus without complication ([place])    Glaucoma    Hypertension    Hypertensive retinopathy    OU   Neuromuscular disorder ([place])    Dementia    Thyroid disease    hypothyroidism   Past Surgical History:  Procedure Laterality Date   ABDOMINAL HYSTERECTOMY  1990   TAH BSO   CATARACT EXTRACTION Bilateral    Dr. [name]   COLONOSCOPY WITH PROPOFOL N/A 04/08/2021   Procedure: COLONOSCOPY WITH PROPOFOL;  Surgeon: [name], MD;  Location: [facility];  Service: Endoscopy;  Laterality: N/A;   EYE SURGERY Bilateral    Cat Sx OU   OOPHORECTOMY     BSO   Vaginal Bx     Papilloma   FAMILY HISTORY Family History  Problem Relation Age of Onset   Sickle cell anemia Other  Hypertension Mother    Cancer Father        LIVER   Heart disease Brother    Cancer Brother        STOMACH   SOCIAL HISTORY Social History   Tobacco Use   Smoking status: Never   Smokeless tobacco: Never  Vaping Use   Vaping Use: Never used  Substance Use Topics   Alcohol use: No   Drug use: No       OPHTHALMIC EXAM: Base Eye Exam     Visual Acuity (Snellen - Linear)       Right Left   Dist cc 20/250 +1 20/70   Dist ph cc 20/150 20/30    Correction: Glasses         Tonometry (Tonopen, 9:33 AM)       Right Left   Pressure 12 14         Pupils       Dark Light Shape React APD   Right 3 2 Round Brisk None   Left 3 2 Round Brisk  None         Visual Fields       Left Right    Full    Restrictions  Partial outer superior temporal, inferior temporal deficiencies         Extraocular Movement       Right Left    Full, Ortho Full, Ortho         Neuro/Psych     Oriented x3: Yes   Mood/Affect: Normal         Dilation     Both eyes: 1.0% Mydriacyl, 2.5% Phenylephrine @ 9:29 AM           Slit Lamp and Fundus Exam     Slit Lamp Exam       Right Left   Lids/Lashes Dermatochalasis - upper lid, Meibomian gland dysfunction Dermatochalasis - upper lid, Meibomian gland dysfunction   Conjunctiva/Sclera Melanosis Melanosis   Cornea Arcus, trace Punctate epithelial erosions, tear film debris, 1+ [place], Well healed temporal cataract wound Arcus, Inferior 1+ Punctate epithelial erosions, well healed cataract wound   Anterior Chamber Deep and quiet Deep;  no cell or flare   Iris Round and dilated, No NVI Round and dilated, No NVI   Lens PC IOL in good position, 2+ Posterior capsular opacification Posterior chamber intraocular lens in good position, 1-2+ PCO.   Anterior Vitreous Vitreous syneresis, vitreous condensations, silicone oil bubbles Vitreous syneresis, Vitreous condensations         Fundus Exam       Right Left   Disc Sharp rim, inferior notch, 3+ pallor, inf. Rim thinning, attenuated vessels superiorly, +disc heme at 1100 -- persistent, fine vascular loops superiorly 2+ pallor, Sharp rim, thin inferior rim   C/D Ratio 0.85 0.75   Macula Blunted foveal reflex, edema/cystic changes superior mac; stable improvement in DBH superior mac, central exudates, fibrosis and atrophy Flat, good foveal reflex, mild RPE mottling and clumping, Epiretinal membrane, No heme or edema   Vessels superior BRVO with severe attenuation of ST arterioles, Tortuous attenuated, Tortuous   Periphery Attached, DBH superior hemisphere extended from BRVO--slightly improved Attached, mild, scattered Reticular degeneration,  No heme           Refraction     Wearing Rx       Sphere Cylinder Axis Add   Right -0.75 +1.25 170 +2.50   Left -0.75 +1.00 003 +2.50    Type: PAL  Wearing Rx #2       Sphere Cylinder Axis Add   Right -0.75 +1.25 170 +2.50   Left -0.75 +1.00 003 +2.50    Type: PAL           IMAGING AND PROCEDURES  Imaging and Procedures for 03/07/18  OCT, Retina - OU - Both Eyes       Right Eye Quality was good. Central Foveal Thickness: 246. Progression has improved. Findings include no SRF, abnormal foveal contour, subretinal hyper-reflective material, intraretinal hyper-reflective material, epiretinal membrane, intraretinal fluid, outer retinal atrophy, vitreomacular adhesion (persistent IRF/central cystic changes -- slightly improved, central SRHM and patchy ORA; diffuse retinal thinning--stable ).   Left Eye Quality was good. Central Foveal Thickness: 258. Progression has been stable. Findings include no IRF, no SRF, abnormal foveal contour, epiretinal membrane, vitreomacular adhesion (Blunted foveal depression -- stable).   Notes *Images captured and stored on drive  Diagnosis / Impression:  ERM OU OD: BRVO w/ CME -- persistent IRF/central cystic changes -- slightly improved, central SRHM and patchy ORA; diffuse retinal thinning--stable OS: very mild ERM with blunted foveal depression -- stable; mild VMA  Clinical management:  See below  Abbreviations: NFP - Normal foveal profile. CME - cystoid macular edema. PED - pigment epithelial detachment. IRF - intraretinal fluid. SRF - subretinal fluid. EZ - ellipsoid zone. ERM - epiretinal membrane. ORA - outer retinal atrophy. ORT - outer retinal tubulation. SRHM - subretinal hyper-reflective material       Intravitreal Injection, Pharmacologic Agent - OD - Right Eye       Time Out 09/04/2022. 11:22 AM. Confirmed correct patient, procedure, site, and patient consented.   Anesthesia Topical anesthesia was used.  Anesthetic medications included Lidocaine 2%, Proparacaine 0.5%.   Procedure Preparation included 5% betadine to ocular surface, eyelid speculum. A (32g) needle was used.   Injection: 2 mg aflibercept 2 MG/0.05ML   Route: Intravitreal, Site: Right Eye   NDC: A3590391, Lot: [id number], Expiration date: 10/28/2023, Waste: 0 mL   Post-op Post injection exam found visual acuity of at least counting fingers. The patient tolerated the procedure well. There were no complications. The patient received written and verbal post procedure care education. Post injection medications were not given.            ASSESSMENT/PLAN:   ICD-10-CM   1. Branch retinal vein occlusion of right eye with macular edema  H34.8310 OCT, Retina - OU - Both Eyes    Intravitreal Injection, Pharmacologic Agent - OD - Right Eye    aflibercept (EYLEA) SOLN 2 mg    2. Diabetes mellitus type 2 without retinopathy ([place])  E11.9     3. Epiretinal membrane (ERM) of both eyes  H35.373     4. Pseudophakia of both eyes  Z96.1     5. Ocular hypertension of right eye  H40.051     6. Glaucoma suspect of both eyes  H40.003     7. Essential hypertension  I10     8. Hypertensive retinopathy of both eyes  H35.033      1. BRVO with CME OD  - lost to follow up from 02.22.22 to 11.1.22 -- re-presented with massive CME and BCVA 20/400 from 20/30  - initial OCT w/ CME superior macula  - s/p IVA OD #1 (10.18.19), #2 (11.19.19), #3 (12.17.19), #4 (02.19.20), #5 (03.23.20), #6 (04.21.20), #7 (05.27.20), #8 (07.07.20), #9 (08.25.20), #10 (9.29.20), #11 (11.17.20), #12 (12.22.20), #13 (01.27.21), #14 (03.10.21), #15 (04.21.21), #16 (05.26.21), #17 (06.30.21), #18 (  08.04.21), #19 (09.01.21), #20 (10.06.21), #21 (11.17.21), #22 (12.30.21), #23 (2.22.22), #24 (11.1.22), #25 (11.29.22), #26 (01.03.23), #27 (02.14.23), #28 (04.07.23), #29 (05.19.23), #30 (06.20.23) -- IVA resistance  - s/p IVE OD #1 (08.11.23)  **history of worsening  IRF/cystic changes at 6+ weeks interval, IVA**  - BCVA OD improved to 20/150 from 20/300 -- limited by central atrophy  - OCT shows persistent IRF/central cystic changes -- slightly improved, central SRHM and patchy ORA; diffuse retinal thinning--stable at 4 weeks  - good initial response to IVE  - recommend IVE OD #2 today, 09.08.23   - RBA of procedure discussed, questions answered - informed consent obtained and signed - see procedure note  - [place] informed consent form re-signed and scanned on 05.19.23  - Eylea informed consent form signed and scanned on 08.11.23  - f/u 4 weeks -- DFE/OCT, possible injection   2. Diabetes mellitus, type 2 without retinopathy  - The incidence, risk factors for progression, natural history and treatment options for diabetic retinopathy  were discussed with patient.     - The need for close monitoring of blood glucose, blood pressure, and serum lipids, avoiding cigarette or any type of tobacco, and the need for long term follow up was also discussed with patient.    3. Epiretinal membrane, OU  - relatively mild ERM OU -- blunted central foveal depression  - OD with mild cystic changes 2/2 BRVO as above; OS without cystic changes or edema  - discussed findings and prognosis   - monitor   4. Pseudophakia OU  - s/p CE/IOL OS 11.13.19 w/ Dr. [name]  - s/p CE/IOL OD 01.16.20 w/ Dr. [name]  - beautiful surgeries -- IOLs in perfect position  - healing well post-operatively  - IRF/CME OD may have been partly due to post op CME ([place]) in addition to BRVO  5,6. Ocular hypertension / Glaucoma suspect OD>OS  - IOP ~25 OD, 20 OS at prior clinic visits  - today IOP 12,14  - denies any family hx of glaucoma   - on Cosopt and brimonidine BID OU per Dr. Lucianne Lei  7,8. Hypertensive retinopathy OU  - pt presented to 6.24.2020 visit urgently for right sided headache above right eye with extension to occiput  - BP at that time was 201/100 -- pt was sent to  primary care clinic for urgent evaluation and meds were adjusted  - BP now under better control and headaches improved  - BP reading 11.1.22 -- 180/84 -- advised f/u with PCP  - discussed importance of tight BP control   Ophthalmic Meds Ordered this visit:  Meds ordered this encounter  Medications   aflibercept (EYLEA) SOLN 2 mg     Return in about 4 weeks (around 10/02/2022) for f/u BRVO OD, DFE, OCT.  There are no Patient Instructions on file for this visit.  This document serves as a record of services personally performed by Gardiner Sleeper, MD, PhD. It was created on their behalf by San Jetty. Owens Shark, OA an ophthalmic technician. The creation of this record is the provider's dictation and/or activities during the visit.    Electronically signed by: San Jetty. Owens Shark, [place] 08.31.2023 4:15 PM   Gardiner Sleeper, M.D., Ph.D. Diseases & Surgery of the Retina and Vitreous [facility]  I have reviewed the above documentation for accuracy and completeness, and I agree with the above. Gardiner Sleeper, M.D., Ph.D. 09/04/22 4:15 PM  Abbreviations: M myopia (nearsighted); A astigmatism; H hyperopia (  farsighted); P presbyopia; Mrx spectacle prescription;  CTL contact lenses; OD right eye; OS left eye; OU both eyes  XT exotropia; ET esotropia; PEK punctate epithelial keratitis; PEE punctate epithelial erosions; DES dry eye syndrome; MGD meibomian gland dysfunction; ATs artificial tears; PFAT's preservative free artificial tears; [place] nuclear sclerotic cataract; PSC posterior subcapsular cataract; ERM epi-retinal membrane; PVD posterior vitreous detachment; RD retinal detachment; DM diabetes mellitus; DR diabetic retinopathy; NPDR non-proliferative diabetic retinopathy; PDR proliferative diabetic retinopathy; CSME clinically significant macular edema; DME diabetic macular edema; dbh dot blot hemorrhages; CWS cotton wool spot; POAG primary open angle glaucoma; C/D cup-to-disc ratio;  HVF humphrey visual field; GVF goldmann visual field; OCT optical coherence tomography; IOP intraocular pressure; BRVO Branch retinal vein occlusion; CRVO central retinal vein occlusion; CRAO central retinal artery occlusion; BRAO branch retinal artery occlusion; RT retinal tear; SB scleral buckle; PPV pars plana vitrectomy; VH Vitreous hemorrhage; PRP panretinal laser photocoagulation; IVK intravitreal kenalog; VMT vitreomacular traction; MH Macular hole;  NVD neovascularization of the disc; NVE neovascularization elsewhere; AREDS age related eye disease study; ARMD age related macular degeneration; POAG primary open angle glaucoma; EBMD epithelial/anterior basement membrane dystrophy; ACIOL anterior chamber intraocular lens; IOL intraocular lens; PCIOL posterior chamber intraocular lens; Phaco/IOL phacoemulsification with intraocular lens placement; [place] photorefractive keratectomy; LASIK laser assisted in situ keratomileusis; HTN hypertension; DM diabetes mellitus; COPD chronic obstructive pulmonary disease

## 2022-09-04 ENCOUNTER — Encounter (INDEPENDENT_AMBULATORY_CARE_PROVIDER_SITE_OTHER): Payer: Medicare PPO | Admitting: Ophthalmology

## 2022-09-04 ENCOUNTER — Encounter (INDEPENDENT_AMBULATORY_CARE_PROVIDER_SITE_OTHER): Payer: Self-pay | Admitting: Ophthalmology

## 2022-09-04 ENCOUNTER — Ambulatory Visit (INDEPENDENT_AMBULATORY_CARE_PROVIDER_SITE_OTHER): Payer: Medicare PPO | Admitting: Ophthalmology

## 2022-09-04 ENCOUNTER — Other Ambulatory Visit: Payer: Self-pay | Admitting: *Deleted

## 2022-09-04 DIAGNOSIS — E119 Type 2 diabetes mellitus without complications: Secondary | ICD-10-CM

## 2022-09-04 DIAGNOSIS — H34831 Tributary (branch) retinal vein occlusion, right eye, with macular edema: Secondary | ICD-10-CM | POA: Diagnosis not present

## 2022-09-04 DIAGNOSIS — Z961 Presence of intraocular lens: Secondary | ICD-10-CM

## 2022-09-04 DIAGNOSIS — I10 Essential (primary) hypertension: Secondary | ICD-10-CM

## 2022-09-04 DIAGNOSIS — H35373 Puckering of macula, bilateral: Secondary | ICD-10-CM | POA: Diagnosis not present

## 2022-09-04 DIAGNOSIS — H40051 Ocular hypertension, right eye: Secondary | ICD-10-CM | POA: Diagnosis not present

## 2022-09-04 DIAGNOSIS — H35033 Hypertensive retinopathy, bilateral: Secondary | ICD-10-CM

## 2022-09-04 DIAGNOSIS — H40003 Preglaucoma, unspecified, bilateral: Secondary | ICD-10-CM

## 2022-09-04 DIAGNOSIS — I714 Abdominal aortic aneurysm, without rupture, unspecified: Secondary | ICD-10-CM

## 2022-09-04 MED ORDER — AFLIBERCEPT 2MG/0.05ML IZ SOLN FOR KALEIDOSCOPE
2.0000 mg | INTRAVITREAL | Status: AC | PRN
  Administered 2022-09-04: 2 mg via INTRAVITREAL

## 2022-09-11 ENCOUNTER — Ambulatory Visit (HOSPITAL_COMMUNITY)
Admission: RE | Admit: 2022-09-11 | Discharge: 2022-09-11 | Disposition: A | Discharge status: Home / Self Care | Payer: Medicare PPO | Source: Ambulatory Visit | Attending: Vascular Surgery | Admitting: Vascular Surgery

## 2022-09-11 DIAGNOSIS — I714 Abdominal aortic aneurysm, without rupture, unspecified: Secondary | ICD-10-CM | POA: Insufficient documentation

## 2022-09-15 ENCOUNTER — Encounter: Payer: Self-pay | Admitting: Family Medicine

## 2022-09-15 ENCOUNTER — Encounter: Payer: Self-pay | Admitting: Vascular Surgery

## 2022-09-17 ENCOUNTER — Other Ambulatory Visit: Payer: Self-pay

## 2022-09-17 DIAGNOSIS — I71012 Dissection of descending thoracic aorta: Secondary | ICD-10-CM

## 2022-09-17 DIAGNOSIS — I719 Aortic aneurysm of unspecified site, without rupture: Secondary | ICD-10-CM

## 2022-09-17 NOTE — Progress Notes (Deleted)
No chief complaint on file.   HISTORY OF PRESENT ILLNESS:  09/17/22 ALL:  Alejandra Davis is a 86 y.o. female here today for follow up for AD. She was last seen by [name] 02/2022 and memantine XR increased to '14mg'$  daily. Unable to tolerate donepezil in the past.    HISTORY (copied from Dr [name] previous note)  Alejandra Davis is an 86 year old right-handed woman with an underlying medical history of diabetes, hypertension, branch retinal vein occlusion, macular edema, hearing loss, and hypothyroidism, who presents for follow-up consultation of her memory loss.  The patient is accompanied by her Aide, Caroll, today, and presents after a long gap of over 2 years.  I last saw her on 08/29/2019, at which time she was on Aricept 5 mg daily, had occasional GI complaints including stomach cramping and nausea, no vomiting or diarrhea.  She had weight loss.  She was advised to follow-up with her primary care to make sure there is no medical reason for her weight loss.  She was advised to increase her donepezil to 10 mg daily.  I had previously talked her about pursuing a sleep study but she did not wish to pursue this.   Her daughter called in the interim in September 2020 reporting that she significant side effects from the donepezil and she was therefore advised to taper off of it.   The patient had a follow-up appointment with [name], NP on 11/28/2019, at which time she was accompanied by her niece.  Her side effects had resolved after stopping the donepezil.  Her MMSE was 23 at the time.  The addition of Namenda was discussed but it was not started due to her family being concerned about side effects.    She did not return for follow-up since then.   She had an interim evaluation as per PCP request, with Dr. [name], neuropsychologist, in February 2022 and his impression was <<Diagnostic Impressions: Alzheimer's disease, late onset Cerebrovascular small vessel disease>>. She had a feedback  appointment with Dr. [name] in February 2022.   She was advised no longer to drive.   Today, 03/19/2022: She reports feeling okay, she is not able to provide much in the way of details of her history.  She denies any pain, any depression or stress.  She has not fallen.  Per Caroll she does her own laundry and also cooks some.  Caroll is there from 10 AM to 2 PM daily, Mondays through Fridays, there is another caretaker that is there from 4 PM to 7 PM daily, nobody stays with her overnight.  There have been no reports of falls, no wandering, no behavioral escalations, sometimes patient gets frustrated at times if she does not remember something.  No anger outbursts.  She tries to hydrate well, drinks 8 ounce bottles of water, typically 3 bottles well Caroll is there.  She has not driven a car in the months as far as her caretaker knows.  Patient reports that she drove her car to the grocery store in the afternoon some 2 weeks ago. This is not confirmed by the caretaker.  I reviewed her office visit note with her primary care on 11/18/2021.  She had blood work at the time which I reviewed: BUN was elevated at 28, creatinine elevated at 1.37.  She had a total cholesterol of 216, thyroid function was benign, LDL 112.  She was started on memantine ER 7 mg strength in late November 2022 as I understand.  She seems to tolerate this well, her caretaker reports occasional diarrhea.   REVIEW OF SYSTEMS: Out of a complete 14 system review of symptoms, the patient complains only of the following symptoms, and all other reviewed systems are negative.   ALLERGIES: Allergies  Allergen Reactions   Donepezil Hcl     Stomach cramps      HOME MEDICATIONS: Outpatient Medications Prior to Visit  Medication Sig Dispense Refill   amLODipine (NORVASC) 10 MG tablet TAKE 1 TABLET(10 MG) BY MOUTH DAILY 90 tablet 1   ASPIRIN LOW DOSE 81 MG EC tablet Take 81 mg by mouth daily.     dorzolamide-timolol (COSOPT) 22.3-6.8  MG/ML ophthalmic solution Place 1 drop into both eyes 2 (two) times daily.     Lancets Ultra Thin 30G MISC 100 each by Does not apply route 3 (three) times a week. 100 each 0   levothyroxine (SYNTHROID) 75 MCG tablet Take 1 tablet (75 mcg total) by mouth daily. 90 tablet 1   lisinopril (ZESTRIL) 2.5 MG tablet Take 1 tablet (2.5 mg total) by mouth daily. 90 tablet 1   loratadine (CLARITIN) 10 MG tablet TAKE 1 TABLET(10 MG) BY MOUTH DAILY 90 tablet 1   memantine (NAMENDA XR) 14 MG CP24 24 hr capsule Take 1 capsule (14 mg total) by mouth daily. 30 capsule 3   metoprolol tartrate (LOPRESSOR) 50 MG tablet TAKE 1 TABLET(50 MG) BY MOUTH DAILY 90 tablet 1   No facility-administered medications prior to visit.     PAST MEDICAL HISTORY: Past Medical History:  Diagnosis Date   Chicken pox    Cystocele    Diabetes mellitus without complication ([place])    Glaucoma    Hypertension    Hypertensive retinopathy    OU   Neuromuscular disorder ([place])    Dementia    Thyroid disease    hypothyroidism     PAST SURGICAL HISTORY: Past Surgical History:  Procedure Laterality Date   ABDOMINAL HYSTERECTOMY  1990   TAH BSO   CATARACT EXTRACTION Bilateral    Dr. [name]   COLONOSCOPY WITH PROPOFOL N/A 04/08/2021   Procedure: COLONOSCOPY WITH PROPOFOL;  Surgeon: [name], MD;  Location: [facility];  Service: Endoscopy;  Laterality: N/A;   EYE SURGERY Bilateral    Cat Sx OU   OOPHORECTOMY     BSO   Vaginal Bx     Papilloma     FAMILY HISTORY: Family History  Problem Relation Age of Onset   Sickle cell anemia Other    Hypertension Mother    Cancer Father        LIVER   Heart disease Brother    Cancer Brother        STOMACH     SOCIAL HISTORY: Social History   Socioeconomic History   Marital status: Divorced    Spouse name: Not on file   Number of children: Not on file   Years of education: Not on file   Highest education level: Not on file  Occupational History   Not on  file  Tobacco Use   Smoking status: Never   Smokeless tobacco: Never  Vaping Use   Vaping Use: Never used  Substance and Sexual Activity   Alcohol use: No   Drug use: No   Sexual activity: Not Currently    Birth control/protection: Surgical  Other Topics Concern   Not on file  Social History Narrative   Social History      Diet? n/a  Do you drink/eat things with caffeine? Yes   Marital status?      Divorced                               What year were you married? 1962      Do you live in a house, apartment, assisted living, condo, trailer, etc.? house      Is it one or more stories? One story      How many persons live in your home? 1      Do you have any pets in your home? (please list) none       Highest level of education completed? 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               sometimes                       Type & how often? Walking       Advanced Directives      Do you have a living will? No       Do you have a DNR form?                                  If not, do you want to discuss one?  No       Do you have signed POA/HPOA for forms?       Functional Status      Do you have difficulty bathing or dressing yourself? No       Do you have difficulty preparing food or eating? No       Do you have difficulty managing your medications? No       Do you have difficulty managing your finances? No       Do you have difficulty affording your medications? No       Social Determinants of Radio broadcast assistant Strain: Not on file  Food Insecurity: Not on file  Transportation Needs: Not on file  Physical Activity: Not on file  Stress: Not on file  Social Connections: Not on file  Intimate Partner Violence: Not on file     PHYSICAL EXAM  There were no vitals filed for this visit. There is no height or  weight on file to calculate BMI.  Generalized: Well developed, in no acute distress  Cardiology: normal rate and rhythm, no murmur auscultated  Respiratory: clear to auscultation bilaterally    Neurological examination  Mentation: Alert oriented to time, place, history taking. Follows all commands speech and language fluent Cranial nerve II-XII: Pupils were equal round reactive to light. Extraocular movements were full, visual field were full on confrontational test. Facial sensation and strength were normal. Uvula tongue midline. Head turning and shoulder shrug  were normal and symmetric. Motor: The motor testing reveals 5 over 5 strength of all 4 extremities. Good symmetric motor tone is noted throughout.  Sensory: Sensory testing is intact to soft touch on all 4 extremities. No evidence of extinction is noted.  Coordination: Cerebellar testing reveals good finger-nose-finger and heel-to-shin bilaterally.  Gait and station: Gait is normal. Tandem gait is normal. Romberg is negative. No drift is seen.  Reflexes: Deep tendon reflexes are symmetric and normal bilaterally.    DIAGNOSTIC DATA (LABS, IMAGING, TESTING) - I reviewed patient records, labs, notes, testing and imaging myself where available.  Lab Results  Component Value Date   WBC 5.7 03/24/2022   HGB 12.6 03/24/2022   HCT 39.2 03/24/2022   MCV 82.7 03/24/2022   PLT 215 03/24/2022      Component Value Date/Time   NA 142 03/24/2022 2226   K 4.0 03/24/2022 2226   CL 111 03/24/2022 2226   CO2 23 03/24/2022 2226   GLUCOSE 88 03/24/2022 2226   BUN 26 (H) 03/24/2022 2226   CREATININE 1.12 (H) 03/24/2022 2226   CREATININE 1.52 (H) 07/21/2021 0808   CALCIUM 9.6 03/24/2022 2226   PROT 7.4 07/22/2021 1932   ALBUMIN 3.8 07/22/2021 1932   AST 20 07/22/2021 1932   ALT 13 07/22/2021 1932   ALKPHOS 47 07/22/2021 1932   BILITOT 0.2 (L) 07/22/2021 1932   GFRNONAA 49 (L) 03/24/2022 2226   GFRNONAA 33 (L) 04/22/2021 1056   GFRAA  38 (L) 04/22/2021 1056   Lab Results  Component Value Date   CHOL 188 07/21/2021   HDL 74 07/21/2021   LDLCALC 97 07/21/2021   TRIG 80 07/21/2021   CHOLHDL 2.5 07/21/2021   Lab Results  Component Value Date   HGBA1C 5.9 (H) 07/21/2021   No results found for: "VITAMINB12" Lab Results  Component Value Date   TSH 1.566 07/22/2021       03/19/2022   10:50 AM 01/29/2021   11:00 AM 11/28/2019    3:38 PM  MMSE - Mini Mental State Exam  Orientation to time 0 0 2  Orientation to Place '4 3 4  '$ Registration '3 3 3  '$ Attention/ Calculation '5 2 5  '$ Recall 0 0 0  Language- name 2 objects '2 2 2  '$ Language- repeat '1 1 1  '$ Language- follow 3 step command '3 3 3  '$ Language- read & follow direction '1 1 1  '$ Write a sentence '1 1 1  '$ Copy design 1 0 1  Copy design-comments   named 6 animals  Total score '21 16 23         '$ No data to display           ASSESSMENT AND PLAN  86 y.o. year old female  has a past medical history of Chicken pox, Cystocele, Diabetes mellitus without complication ([place]), Glaucoma, Hypertension, Hypertensive retinopathy, Neuromuscular disorder ([place]), and Thyroid disease. here with    No diagnosis found.  [name] ***.  Healthy lifestyle habits encouraged. *** will follow up with PCP as directed. *** will return to see me in ***, sooner if needed. *** verbalizes understanding and agreement with this plan.   No orders of the defined types were placed in this encounter.    No orders of the defined types were placed in this encounter.    Debbora Presto, MSN, FNP-C 09/17/2022, 9:32 AM  [facility] [address] [place], [place] [address] [phone]

## 2022-09-17 NOTE — Addendum Note (Signed)
Addended by: Kaleen Mask on: 09/17/2022 11:53 AM   Modules accepted: Orders

## 2022-09-22 ENCOUNTER — Ambulatory Visit: Payer: Medicare PPO | Admitting: Family Medicine

## 2022-09-22 NOTE — Progress Notes (Signed)
[facility] Note  10/02/2022    CHIEF COMPLAINT Patient presents for Retina Follow Up  HISTORY OF PRESENT ILLNESS: Alejandra Davis is a 86 y.o. female who presents to the clinic today for:  HPI     Retina Follow Up   Patient presents with  CRVO/BRVO.  In right eye.  Severity is moderate.  Duration of 4 weeks.  Since onset it is stable.  I, the attending physician,  performed the HPI with the patient and updated documentation appropriately.      Last edited by [name], MD on 10/02/2022 12:46 PM.    Patient feels that the vision is the same.   Referring physician: Faustino Congress, NP [place],  [place] [address]  HISTORICAL INFORMATION:   Selected notes from the MEDICAL RECORD NUMBER Referred by [name] for DM exam   CURRENT MEDICATIONS: Current Outpatient Medications (Ophthalmic Drugs)  Medication Sig   dorzolamide-timolol (COSOPT) 22.3-6.8 MG/ML ophthalmic solution Place 1 drop into both eyes 2 (two) times daily.   No current facility-administered medications for this visit. (Ophthalmic Drugs)   Current Outpatient Medications (Other)  Medication Sig   amLODipine (NORVASC) 10 MG tablet TAKE 1 TABLET(10 MG) BY MOUTH DAILY   ASPIRIN LOW DOSE 81 MG EC tablet Take 81 mg by mouth daily.   levothyroxine (SYNTHROID) 75 MCG tablet Take 1 tablet (75 mcg total) by mouth daily.   lisinopril (ZESTRIL) 2.5 MG tablet Take 1 tablet (2.5 mg total) by mouth daily.   loratadine (CLARITIN) 10 MG tablet TAKE 1 TABLET(10 MG) BY MOUTH DAILY   memantine (NAMENDA XR) 14 MG CP24 24 hr capsule Take 1 capsule (14 mg total) by mouth daily.   metoprolol tartrate (LOPRESSOR) 50 MG tablet TAKE 1 TABLET(50 MG) BY MOUTH DAILY   Lancets Ultra Thin 30G MISC 100 each by Does not apply route 3 (three) times a week.   No current facility-administered medications for this visit. (Other)   REVIEW OF SYSTEMS: ROS   Positive for: Skin, Genitourinary,  Musculoskeletal, Endocrine, Cardiovascular, Eyes Negative for: Constitutional, Gastrointestinal, Neurological, HENT, Respiratory, Psychiatric, Allergic/Imm, Heme/Lymph Last edited by Roselee Nova D, COT on 10/02/2022  9:04 AM.     ALLERGIES Allergies  Allergen Reactions   Donepezil Hcl     Stomach cramps    PAST MEDICAL HISTORY Past Medical History:  Diagnosis Date   Chicken pox    Cystocele    Diabetes mellitus without complication ([place])    Glaucoma    Hypertension    Hypertensive retinopathy    OU   Neuromuscular disorder ([place])    Dementia    Thyroid disease    hypothyroidism   Past Surgical History:  Procedure Laterality Date   ABDOMINAL HYSTERECTOMY  1990   TAH BSO   CATARACT EXTRACTION Bilateral    Dr. [name]   COLONOSCOPY WITH PROPOFOL N/A 04/08/2021   Procedure: COLONOSCOPY WITH PROPOFOL;  Surgeon: [name], MD;  Location: [facility];  Service: Endoscopy;  Laterality: N/A;   EYE SURGERY Bilateral    Cat Sx OU   OOPHORECTOMY     BSO   Vaginal Bx     Papilloma   FAMILY HISTORY Family History  Problem Relation Age of Onset   Sickle cell anemia Other    Hypertension Mother    Cancer Father        LIVER   Heart disease Brother    Cancer Brother  STOMACH   SOCIAL HISTORY Social History   Tobacco Use   Smoking status: Never   Smokeless tobacco: Never  Vaping Use   Vaping Use: Never used  Substance Use Topics   Alcohol use: No   Drug use: No       OPHTHALMIC EXAM: Base Eye Exam     Visual Acuity (Snellen - Linear)       Right Left   Dist [place] 20/100 -2 20/30 -2   Dist ph [place] NI 20/25 -2         Tonometry (Tonopen, 9:11 AM)       Right Left   Pressure 11 13         Pupils       Dark Light Shape React APD   Right 3 2 Round Brisk None   Left 3 2 Round Brisk None         Visual Fields (Counting fingers)       Left Right    Full    Restrictions  Partial outer superior temporal, inferior temporal deficiencies          Extraocular Movement       Right Left    Full, Ortho Full, Ortho         Neuro/Psych     Oriented x3: Yes   Mood/Affect: Normal         Dilation     Both eyes: 1.0% Mydriacyl, 2.5% Phenylephrine @ 9:11 AM           Slit Lamp and Fundus Exam     Slit Lamp Exam       Right Left   Lids/Lashes Dermatochalasis - upper lid, Meibomian gland dysfunction Dermatochalasis - upper lid, Meibomian gland dysfunction   Conjunctiva/Sclera Melanosis Melanosis   Cornea Arcus, trace Punctate epithelial erosions, tear film debris, 1+ [place], Well healed temporal cataract wound Arcus, Inferior 1+ Punctate epithelial erosions, well healed cataract wound   Anterior Chamber Deep and quiet Deep;  no cell or flare   Iris Round and dilated, No NVI Round and dilated, No NVI   Lens PC IOL in good position, 2+ Posterior capsular opacification Posterior chamber intraocular lens in good position, 1-2+ PCO.   Anterior Vitreous Vitreous syneresis, vitreous condensations, silicone oil bubbles Vitreous syneresis, Vitreous condensations         Fundus Exam       Right Left   Disc Sharp rim, inferior notch, 3+ pallor, inf. Rim thinning, attenuated vessels superiorly, +disc heme at 1100 -- persistent, fine vascular loops superiorly 2+ pallor, Sharp rim, thin inferior rim   C/D Ratio 0.85 0.75   Macula Blunted foveal reflex, edema/cystic changes superior mac; stable improvement in DBH superior mac, central exudates, fibrosis and atrophy Flat, good foveal reflex, mild RPE mottling and clumping, Epiretinal membrane, No heme or edema   Vessels superior BRVO with severe attenuation of ST arterioles, Tortuous attenuated, Tortuous   Periphery Attached, DBH superior hemisphere extended from BRVO--slightly improved Attached, mild, scattered Reticular degeneration, No heme           IMAGING AND PROCEDURES  Imaging and Procedures for 03/07/18  OCT, Retina - OU - Both Eyes       Right  Eye Quality was good. Central Foveal Thickness: 231. Progression has improved. Findings include no SRF, abnormal foveal contour, subretinal hyper-reflective material, intraretinal hyper-reflective material, epiretinal membrane, intraretinal fluid, outer retinal atrophy, vitreomacular adhesion (persistent IRF/central cystic changes -- slightly improved, central SRHM and patchy ORA; diffuse  retinal thinning--stable ).   Left Eye Quality was good. Central Foveal Thickness: 252. Progression has been stable. Findings include no IRF, no SRF, abnormal foveal contour, epiretinal membrane, vitreomacular adhesion (Blunted foveal depression -- stable).   Notes *Images captured and stored on drive  Diagnosis / Impression:  ERM OU OD: BRVO w/ CME -- persistent IRF/central cystic changes -- slightly improved, central SRHM and patchy ORA; diffuse retinal thinning--stable OS: very mild ERM with blunted foveal depression -- stable; mild VMA  Clinical management:  See below  Abbreviations: NFP - Normal foveal profile. CME - cystoid macular edema. PED - pigment epithelial detachment. IRF - intraretinal fluid. SRF - subretinal fluid. EZ - ellipsoid zone. ERM - epiretinal membrane. ORA - outer retinal atrophy. ORT - outer retinal tubulation. SRHM - subretinal hyper-reflective material       Intravitreal Injection, Pharmacologic Agent - OD - Right Eye       Time Out 10/02/2022. 9:44 AM. Confirmed correct patient, procedure, site, and patient consented.   Anesthesia Topical anesthesia was used. Anesthetic medications included Lidocaine 2%, Proparacaine 0.5%.   Procedure Preparation included 5% betadine to ocular surface, eyelid speculum. A (32g) needle was used.   Injection: 2 mg aflibercept 2 MG/0.05ML   Route: Intravitreal, Site: Right Eye   NDC: [id number], Lot: [id number], Expiration date: 01/28/2024, Waste: 0 mL   Post-op Post injection exam found visual acuity of at least counting fingers.  The patient tolerated the procedure well. There were no complications. The patient received written and verbal post procedure care education. Post injection medications were not given.            ASSESSMENT/PLAN:   ICD-10-CM   1. Branch retinal vein occlusion of right eye with macular edema  H34.8310 OCT, Retina - OU - Both Eyes    Intravitreal Injection, Pharmacologic Agent - OD - Right Eye    aflibercept (EYLEA) SOLN 2 mg    2. Diabetes mellitus type 2 without retinopathy ([place])  E11.9     3. Epiretinal membrane (ERM) of both eyes  H35.373     4. Pseudophakia of both eyes  Z96.1     5. Ocular hypertension of right eye  H40.051     6. Glaucoma suspect of both eyes  H40.003     7. Essential hypertension  I10     8. Hypertensive retinopathy of both eyes  H35.033      1. BRVO with CME OD  - lost to follow up from 02.22.22 to 11.1.22 -- re-presented with massive CME and BCVA 20/400 from 20/30  - initial OCT w/ CME superior macula  - s/p IVA OD #1 (10.18.19), #2 (11.19.19), #3 (12.17.19), #4 (02.19.20), #5 (03.23.20), #6 (04.21.20), #7 (05.27.20), #8 (07.07.20), #9 (08.25.20), #10 (9.29.20), #11 (11.17.20), #12 (12.22.20), #13 (01.27.21), #14 (03.10.21), #15 (04.21.21), #16 (05.26.21), #17 (06.30.21), #18 (08.04.21), #19 (09.01.21), #20 (10.06.21), #21 (11.17.21), #22 (12.30.21), #23 (2.22.22), #24 (11.1.22), #25 (11.29.22), #26 (01.03.23), #27 (02.14.23), #28 (04.07.23), #29 (05.19.23), #30 (06.20.23) -- IVA resistance  - s/p IVE OD #1 (08.11.23), #2 (09.08.23)  **history of worsening IRF/cystic changes at 6+ weeks interval, IVA**  - BCVA OD improved 20/100 from 20/150-- limited by central atrophy  - OCT shows persistent IRF/central cystic changes -- slightly improved, central SRHM and patchy ORA; diffuse retinal thinning--stable at 4 weeks  - good initial response to IVE  - recommend IVE OD #3 today, 10.06.23   - RBA of procedure discussed, questions answered - informed consent  obtained and  signed - see procedure note  - [place] informed consent form re-signed and scanned on 05.19.23  - Eylea informed consent form signed and scanned on 08.11.23  - f/u 4 weeks -- DFE/OCT, possible injection   2. Diabetes mellitus, type 2 without retinopathy  - The incidence, risk factors for progression, natural history and treatment options for diabetic retinopathy  were discussed with patient.     - The need for close monitoring of blood glucose, blood pressure, and serum lipids, avoiding cigarette or any type of tobacco, and the need for long term follow up was also discussed with patient.    3. Epiretinal membrane, OU  - relatively mild ERM OU -- blunted central foveal depression  - OD with mild cystic changes 2/2 BRVO as above; OS without cystic changes or edema  - discussed findings and prognosis   - continue to monitor   4. Pseudophakia OU  - s/p CE/IOL OS 11.13.19 w/ Dr. [name]  - s/p CE/IOL OD 01.16.20 w/ Dr. [name]  - beautiful surgeries -- IOLs in perfect position  - healing well post-operatively  - IRF/CME OD may have been partly due to post op CME ([place]) in addition to BRVO  5,6. Ocular hypertension / Glaucoma suspect OD>OS  - IOP ~25 OD, 20 OS at prior clinic visits  - today IOP 11,13   - denies any family hx of Glaucoma   - on Cosopt and Brimonidine BID OU per Dr. Lucianne Lei  7,8. Hypertensive retinopathy OU  - pt presented to 6.24.2020 visit urgently for right sided headache above right eye with extension to occiput  - BP at that time was 201/100 -- pt was sent to primary care clinic for urgent evaluation and meds were adjusted  - BP now under better control and headaches improved  - BP reading 11.1.22 -- 180/84 -- advised f/u with PCP  - discussed importance of tight BP control   Ophthalmic Meds Ordered this visit:  Meds ordered this encounter  Medications   aflibercept (EYLEA) SOLN 2 mg     Return in about 4 weeks (around 10/30/2022) for f/u BRVO OD  , DFE, OCT, Possible, IVE, OD.  There are no Patient Instructions on file for this visit.  This document serves as a record of services personally performed by Gardiner Sleeper, MD, PhD. It was created on their behalf by San Jetty. Owens Shark, OA an ophthalmic technician. The creation of this record is the provider's dictation and/or activities during the visit.    Electronically signed by: San Jetty. Owens Shark, [place] 09.26.2023 1:17 PM  This document serves as a record of services personally performed by Gardiner Sleeper, MD, PhD. It was created on their behalf by Renaldo Reel, [place] an ophthalmic technician. The creation of this record is the provider's dictation and/or activities during the visit.    Electronically signed by:  Renaldo Reel, COT  10.06.23  1:17 PM  Gardiner Sleeper, M.D., Ph.D. Diseases & Surgery of the Retina and Vitreous [facility]  I have reviewed the above documentation for accuracy and completeness, and I agree with the above. Gardiner Sleeper, M.D., Ph.D. 10/02/22 1:19 PM  Abbreviations: M myopia (nearsighted); A astigmatism; H hyperopia (farsighted); P presbyopia; Mrx spectacle prescription;  CTL contact lenses; OD right eye; OS left eye; OU both eyes  XT exotropia; ET esotropia; PEK punctate epithelial keratitis; PEE punctate epithelial erosions; DES dry eye syndrome; MGD meibomian gland dysfunction; ATs artificial tears; PFAT's preservative free artificial  tears; [place] nuclear sclerotic cataract; PSC posterior subcapsular cataract; ERM epi-retinal membrane; PVD posterior vitreous detachment; RD retinal detachment; DM diabetes mellitus; DR diabetic retinopathy; NPDR non-proliferative diabetic retinopathy; PDR proliferative diabetic retinopathy; CSME clinically significant macular edema; DME diabetic macular edema; dbh dot blot hemorrhages; CWS cotton wool spot; POAG primary open angle glaucoma; C/D cup-to-disc ratio; HVF humphrey visual field; GVF goldmann  visual field; OCT optical coherence tomography; IOP intraocular pressure; BRVO Branch retinal vein occlusion; CRVO central retinal vein occlusion; CRAO central retinal artery occlusion; BRAO branch retinal artery occlusion; RT retinal tear; SB scleral buckle; PPV pars plana vitrectomy; VH Vitreous hemorrhage; PRP panretinal laser photocoagulation; IVK intravitreal kenalog; VMT vitreomacular traction; MH Macular hole;  NVD neovascularization of the disc; NVE neovascularization elsewhere; AREDS age related eye disease study; ARMD age related macular degeneration; POAG primary open angle glaucoma; EBMD epithelial/anterior basement membrane dystrophy; ACIOL anterior chamber intraocular lens; IOL intraocular lens; PCIOL posterior chamber intraocular lens; Phaco/IOL phacoemulsification with intraocular lens placement; [place] photorefractive keratectomy; LASIK laser assisted in situ keratomileusis; HTN hypertension; DM diabetes mellitus; COPD chronic obstructive pulmonary disease

## 2022-09-23 DIAGNOSIS — H26493 Other secondary cataract, bilateral: Secondary | ICD-10-CM | POA: Diagnosis not present

## 2022-09-23 DIAGNOSIS — H401133 Primary open-angle glaucoma, bilateral, severe stage: Secondary | ICD-10-CM | POA: Diagnosis not present

## 2022-09-23 DIAGNOSIS — E113493 Type 2 diabetes mellitus with severe nonproliferative diabetic retinopathy without macular edema, bilateral: Secondary | ICD-10-CM | POA: Diagnosis not present

## 2022-09-23 DIAGNOSIS — H34831 Tributary (branch) retinal vein occlusion, right eye, with macular edema: Secondary | ICD-10-CM | POA: Diagnosis not present

## 2022-09-24 ENCOUNTER — Encounter (INDEPENDENT_AMBULATORY_CARE_PROVIDER_SITE_OTHER): Payer: Self-pay | Admitting: Ophthalmology

## 2022-09-25 ENCOUNTER — Telehealth: Payer: Self-pay

## 2022-09-25 NOTE — Telephone Encounter (Signed)
Patient's daughter called wanting to know the next steps after getting her Ultrasound recently.  I advised her that [facility] insurance needed the ultrasound before  approving her CTA scans.  Ultrasound was submitted and scans were approved.  They are scheduled and she will get the results after her appt with Dr. [name] on 10/22/22.  Daughter verbalized understanding and will relay this to the patient.

## 2022-09-30 NOTE — Patient Instructions (Signed)
Below is our plan:  We will increase memantine XR to '14mg'$  daily.   Please make sure you are staying well hydrated. I recommend 50-60 ounces daily. Well balanced diet and regular exercise encouraged. Consistent sleep schedule with 6-8 hours recommended.   Please continue follow up with care team as directed.   Follow up with me in 6 months   You may receive a survey regarding today's visit. I encourage you to leave honest feed back as I do use this information to improve patient care. Thank you for seeing me today!   Management of Memory Problems   There are some general things you can do to help manage your memory problems.  Your memory may not in fact recover, but by using techniques and strategies you will be able to manage your memory difficulties better.   1)  Establish a routine. Try to establish and then stick to a regular routine.  By doing this, you will get used to what to expect and you will reduce the need to rely on your memory.  Also, try to do things at the same time of day, such as taking your medication or checking your calendar first thing in the morning. Think about think that you can do as a part of a regular routine and make a list.  Then enter them into a daily planner to remind you.  This will help you establish a routine.   2)  Organize your environment. Organize your environment so that it is uncluttered.  Decrease visual stimulation.  Place everyday items such as keys or cell phone in the same place every day (ie.  Basket next to front door) Use post it notes with a brief message to yourself (ie. Turn off light, lock the door) Use labels to indicate where things go (ie. Which cupboards are for food, dishes, etc.) Keep a notepad and pen by the telephone to take messages   3)  Memory Aids A diary or journal/notebook/daily planner Making a list (shopping list, chore list, to do list that needs to be done) Using an alarm as a reminder (kitchen timer or cell phone  alarm) Using cell phone to store information (Notes, Calendar, Reminders) Calendar/White board placed in a prominent position Post-it notes   In order for memory aids to be useful, you need to have good habits.  It's no good remembering to make a note in your journal if you don't remember to look in it.  Try setting aside a certain time of day to look in journal.   4)  Improving mood and managing fatigue. There may be other factors that contribute to memory difficulties.  Factors, such as anxiety, depression and tiredness can affect memory. Regular gentle exercise can help improve your mood and give you more energy. Exercise: there are short videos created by the [employer] on Health specially for older adults: https://bit.ly/2I30q97.  Mediterranean diet: which emphasizes fruits, vegetables, whole grains, legumes, fish, and other seafood; unsaturated fats such as olive oils; and low amounts of red meat, eggs, and sweets. A variation of this, called MIND ([place] Intervention for Neurodegenerative Delay) incorporates the DASH (Dietary Approaches to Stop Hypertension) diet, which has been shown to lower high blood pressure, a risk factor for Alzheimer's disease. More information at: RepublicForum.gl.  Aerobic exercise that improve heart health is also good for the mind.  [employer] on Aging have short videos for exercises that you can do at home: GoldCloset.com.ee Simple relaxation techniques may help relieve  symptoms of anxiety Try to get back to completing activities or hobbies you enjoyed doing in the past. Learn to pace yourself through activities to decrease fatigue. Find out about some local support groups where you can share experiences with others. Try and achieve 7-8 hours of sleep at night.   Resources for Family/Caregiver  Online caregiver support groups can be found at LDLive.be  or call Alzheimer's Association's 24/7 hotline: 360-286-2206. [place] [facility] offers in-person, virtual support groups and individual counseling for both care partners and persons with memory loss. Call for more information at ([phone])286-4285.   Advanced care plan: there are two types of Power of Attorney: healthcare and durable. Healthcare POA is a designated person to make healthcare decisions on your behalf if you were too sick to make them yourself. This person can be selected and documented by your physician. Durable POA has to be set up with a lawyer who takes charge of your finances and estate if you were too sick or cognitively impaired to manage your finances accurately. You can find a local Elder Engineer, mining here: ToyShower.it.  Check out www.planyourlifespan.org, which will help you plan before a crisis and decide who will take care of life considerations in a circumstance where you may not be able to speak for yourself.   Helpful books (available on [employer] or your local bookstore):  By Dr. Army Chaco: Keeping Love Alive as Memories Fade: The 5 Love Languages and the Alzheimer's Journey Sep 28, 2015 The Dementia Care Partner's Workbook: A Guide for Understanding, Education, and [employer] - May 28, 2018.  Both available for less than $15.   "Coping with behavior change in dementia: a family caregiver's guide" by Mena "A Caregiver's Guide to Dementia: Using Activities and Other Strategies to Prevent, Reduce and Manage Behavioral Symptoms" by Osie Bond Gitlin and [employer].  Arts development officer of Joy for the Person with Alzheimer's or Dementia" 4th edition by Vance Peper  Caregiver videos on common behaviors related to dementia: quierodirigir.com  [place] Caregiver Portal: free to sign up, links to local resources: [email or web address]

## 2022-09-30 NOTE — Progress Notes (Signed)
Chief Complaint  Patient presents with   Follow-up    Pt in room #2 Pt here today for f/u on memory concerns.    HISTORY OF PRESENT ILLNESS:  10/05/22 ALL:  Alejandra Davis is a 86 y.o. female here today for follow up for AD. She was last seen by Alejandra Davis 02/2022 and memantine XR increased to '14mg'$  daily. Unable to tolerate donepezil in the past. She returns with her daughter, Alejandra Davis, who aids in history. Alejandra Davis reports some progression in short term memory loss. Alejandra Davis feels that she is doing fairly well. Alejandra Davis was hesitant to increase menantine dose. Alejandra Davis has continued '7mg'$  daily and tolerating well. She continues to live alone. She is independent in ADLs. She is able to manage medications without difficulty. She has a home care aide 10-5 W-F and 10-2 on M-T. Her daughter and son rotate every other weekend. She is sleeping well and appetite seems normal. She loves to do crossword and word finding puzzles. She was going to [facility] but hasn't been recently. She does not drive.   HISTORY (copied from Alejandra Davis previous note)  Alejandra Davis is an 86 year old right-handed woman with an underlying medical history of diabetes, hypertension, branch retinal vein occlusion, macular edema, hearing loss, and hypothyroidism, who presents for follow-up consultation of her memory loss.  The patient is accompanied by her Aide, Alejandra Davis, today, and presents after a long gap of over 2 years.  I last saw her on 08/29/2019, at which time she was on Aricept 5 mg daily, had occasional GI complaints including stomach cramping and nausea, no vomiting or diarrhea.  She had weight loss.  She was advised to follow-up with her primary care to make sure there is no medical reason for her weight loss.  She was advised to increase her donepezil to 10 mg daily.  I had previously talked her about pursuing a sleep study but she did not wish to pursue this.   Her daughter called in the interim in September 2020 reporting that she significant  side effects from the donepezil and she was therefore advised to taper off of it.   The patient had a follow-up appointment with Alejandra Davis on 11/28/2019, at which time she was accompanied by her niece.  Her side effects had resolved after stopping the donepezil.  Her MMSE was 23 at the time.  The addition of Namenda was discussed but it was not started due to her family being concerned about side effects.    She did not return for follow-up since then.   She had an interim evaluation as per PCP request, with Alejandra Davis, neuropsychologist, in February 2022 and his impression was <<Diagnostic Impressions: Alzheimer's disease, late onset Cerebrovascular small vessel disease>>. She had a feedback appointment with Alejandra Davis in February 2022.   She was advised no longer to drive.   Today, 03/19/2022: She reports feeling okay, she is not able to provide much in the way of details of her history.  She denies any pain, any depression or stress.  She has not fallen.  Per Alejandra Davis she does her own laundry and also cooks some.  Alejandra Davis is there from 10 AM to 2 PM daily, Mondays through Fridays, there is another caretaker that is there from 4 PM to 7 PM daily, nobody stays with her overnight.  There have been no reports of falls, no wandering, no behavioral escalations, sometimes patient gets frustrated at times if she does not remember something.  No anger outbursts.  She tries to hydrate well, drinks 8 ounce bottles of water, typically 3 bottles well Alejandra Davis is there.  She has not driven a car in the months as far as her caretaker knows.  Patient reports that she drove her car to the grocery store in the afternoon some 2 weeks ago. This is not confirmed by the caretaker.  I reviewed her office visit note with her primary care on 11/18/2021.  She had blood work at the time which I reviewed: BUN was elevated at 28, creatinine elevated at 1.37.  She had a total cholesterol of 216, thyroid function was benign,  LDL 112.  She was started on memantine ER 7 mg strength in late November 2022 as I understand.  She seems to tolerate this well, her caretaker reports occasional diarrhea.   REVIEW OF SYSTEMS: Out of a complete 14 system review of symptoms, the patient complains only of the following symptoms,memory loss, and all other reviewed systems are negative.   ALLERGIES: Allergies  Allergen Reactions   Donepezil Hcl     Stomach cramps      HOME MEDICATIONS: Outpatient Medications Prior to Visit  Medication Sig Dispense Refill   amLODipine (NORVASC) 10 MG tablet TAKE 1 TABLET(10 MG) BY MOUTH DAILY 90 tablet 1   ASPIRIN LOW DOSE 81 MG EC tablet Take 81 mg by mouth daily.     dorzolamide-timolol (COSOPT) 22.3-6.8 MG/ML ophthalmic solution Place 1 drop into both eyes 2 (two) times daily.     levothyroxine (SYNTHROID) 75 MCG tablet Take 1 tablet (75 mcg total) by mouth daily. 90 tablet 1   lisinopril (ZESTRIL) 2.5 MG tablet Take 1 tablet (2.5 mg total) by mouth daily. 90 tablet 1   loratadine (CLARITIN) 10 MG tablet TAKE 1 TABLET(10 MG) BY MOUTH DAILY 90 tablet 1   metoprolol tartrate (LOPRESSOR) 50 MG tablet TAKE 1 TABLET(50 MG) BY MOUTH DAILY 90 tablet 1   memantine (NAMENDA XR) 14 MG CP24 24 hr capsule Take 1 capsule (14 mg total) by mouth daily. (Patient taking differently: Take 7 mg by mouth daily.) 30 capsule 3   Lancets Ultra Thin 30G MISC 100 each by Does not apply route 3 (three) times a week. 100 each 0   No facility-administered medications prior to visit.     PAST MEDICAL HISTORY: Past Medical History:  Diagnosis Date   Chicken pox    Cystocele    Diabetes mellitus without complication ([place])    Glaucoma    Hypertension    Hypertensive retinopathy    OU   Neuromuscular disorder ([place])    Dementia    Thyroid disease    hypothyroidism     PAST SURGICAL HISTORY: Past Surgical History:  Procedure Laterality Date   ABDOMINAL HYSTERECTOMY  1990   TAH BSO   CATARACT  EXTRACTION Bilateral    Alejandra. [name]   COLONOSCOPY WITH PROPOFOL N/A 04/08/2021   Procedure: COLONOSCOPY WITH PROPOFOL;  Surgeon: [name], MD;  Location: [facility];  Service: Endoscopy;  Laterality: N/A;   EYE SURGERY Bilateral    Cat Sx OU   OOPHORECTOMY     BSO   Vaginal Bx     Papilloma     FAMILY HISTORY: Family History  Problem Relation Age of Onset   Sickle cell anemia Other    Hypertension Mother    Cancer Father        LIVER   Heart disease Brother    Cancer Brother  STOMACH     SOCIAL HISTORY: Social History   Socioeconomic History   Marital status: Divorced    Spouse name: Not on file   Number of children: Not on file   Years of education: Not on file   Highest education level: Not on file  Occupational History   Not on file  Tobacco Use   Smoking status: Never   Smokeless tobacco: Never  Vaping Use   Vaping Use: Never used  Substance and Sexual Activity   Alcohol use: No   Drug use: No   Sexual activity: Not Currently    Birth control/protection: Surgical  Other Topics Concern   Not on file  Social History Narrative   Social History      Diet? n/a      Do you drink/eat things with caffeine? Yes   Marital status?      Divorced                               What year were you married? 1962      Do you live in a house, apartment, assisted living, condo, trailer, etc.? house      Is it one or more stories? One story      How many persons live in your home? 1      Do you have any pets in your home? (please list) none       Highest level of education completed? 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               sometimes                       Type & how often? Walking       Advanced Directives      Do you have a living will? No       Do you have a DNR form?                                   If not, do you want to discuss one?  No       Do you have signed POA/HPOA for forms?       Functional Status      Do you have difficulty bathing or dressing yourself? No       Do you have difficulty preparing food or eating? No       Do you have difficulty managing your medications? No       Do you have difficulty managing your finances? No       Do you have difficulty affording your medications? No       Social Determinants of Radio broadcast assistant Strain: Not on file  Food Insecurity: Not on file  Transportation Needs: Not on file  Physical Activity: Not on file  Stress: Not on file  Social Connections: Not on file  Intimate Partner Violence: Not on file     PHYSICAL EXAM  Vitals:   10/05/22 0907  BP: (!) 147/86  Pulse: 88  Weight: 128 lb 8 oz (58.3 kg)  Height: '5\' 2"'$  (1.575 m)   Body mass index is 23.5 kg/m.  Generalized: Well developed, in no acute distress  Cardiology: normal rate and rhythm, no murmur auscultated  Respiratory: clear to auscultation bilaterally    Neurological examination  Mentation: Alert, not fully oriented to time but is oriented to place and most history taking. Follows all commands speech and language fluent Cranial nerve II-XII: Pupils were equal round reactive to light. Extraocular movements were full, visual field were full on confrontational test. Facial sensation and strength were normal. Head turning and shoulder shrug  were normal and symmetric. Motor: The motor testing reveals 5 over 5 strength of bilateral upper ext, 4+/5 bilateral lowers. Good symmetric motor tone is noted throughout.  Sensory: Sensory testing is intact to soft touch on all 4 extremities. No evidence of extinction is noted.  Coordination: Cerebellar testing reveals good finger-nose-finger and heel-to-shin bilaterally.  Gait and station: Gait is normal.    DIAGNOSTIC DATA (LABS, IMAGING, TESTING) - I reviewed patient records, labs, notes, testing and  imaging myself where available.  Lab Results  Component Value Date   WBC 5.7 03/24/2022   HGB 12.6 03/24/2022   HCT 39.2 03/24/2022   MCV 82.7 03/24/2022   PLT 215 03/24/2022      Component Value Date/Time   NA 142 03/24/2022 2226   K 4.0 03/24/2022 2226   CL 111 03/24/2022 2226   CO2 23 03/24/2022 2226   GLUCOSE 88 03/24/2022 2226   BUN 26 (H) 03/24/2022 2226   CREATININE 1.12 (H) 03/24/2022 2226   CREATININE 1.52 (H) 07/21/2021 0808   CALCIUM 9.6 03/24/2022 2226   PROT 7.4 07/22/2021 1932   ALBUMIN 3.8 07/22/2021 1932   AST 20 07/22/2021 1932   ALT 13 07/22/2021 1932   ALKPHOS 47 07/22/2021 1932   BILITOT 0.2 (L) 07/22/2021 1932   GFRNONAA 49 (L) 03/24/2022 2226   GFRNONAA 33 (L) 04/22/2021 1056   GFRAA 38 (L) 04/22/2021 1056   Lab Results  Component Value Date   CHOL 188 07/21/2021   HDL 74 07/21/2021   LDLCALC 97 07/21/2021   TRIG 80 07/21/2021   CHOLHDL 2.5 07/21/2021   Lab Results  Component Value Date   HGBA1C 5.9 (H) 07/21/2021   No results found for: "VITAMINB12" Lab Results  Component Value Date   TSH 1.566 07/22/2021       10/05/2022    9:15 AM 03/19/2022   10:50 AM 01/29/2021   11:00 AM  MMSE - Mini Mental State Exam  Orientation to time 2 0 0  Orientation to Place '5 4 3  '$ Registration '3 3 3  '$ Attention/ Calculation '2 5 2  '$ Recall 0 0 0  Language- name 2 objects '2 2 2  '$ Language- repeat '1 1 1  '$ Language- follow 3 step command '3 3 3  '$ Language- read & follow direction '1 1 1  '$ Write a sentence '1 1 1  '$ Copy design 1 1 0  Total score '21 21 16         '$ No data to display           ASSESSMENT AND PLAN  86 y.o. year old female  has a past medical history of Chicken pox, Cystocele, Diabetes mellitus without complication ([place]), Glaucoma, Hypertension, Hypertensive retinopathy, Neuromuscular disorder ([place]), and Thyroid disease. here with    Late onset Alzheimer dementia, unspecified dementia severity, unspecified whether behavioral, psychotic,  or mood disturbance or anxiety ([place])  AMORITA VANROSSUM is doing fairly well, today. Her daughter has noted some progression of short term memory loss. She remains  independent with ADLs but does have a health care aid that comes regularly and her children check on her on the weekends. We have discussed option of increasing memantine, possible side effects and expectations for therapy moving forward. She agrees to increase memantine XR to '14mg'$  daily. She will call for any concerns. Memory compensation strategies reviewed. Resources provided to her daughter for community support. Healthy lifestyle habits encouraged. She will follow up with PCP as directed. She will return to see me in 6 months, sooner if needed. She verbalizes understanding and agreement with this plan.   No orders of the defined types were placed in this encounter.    Meds ordered this encounter  Medications   memantine (NAMENDA XR) 14 MG CP24 24 hr capsule    Sig: Take 1 capsule (14 mg total) by mouth daily.    Dispense:  30 capsule    Refill:  3    Order Specific Question:   Supervising Provider    Answer:   Melvenia Beam [id number]    I spent 30 minutes of face-to-face and non-face-to-face time with patient.  This included previsit chart review, lab review, study review, order entry, electronic health record documentation, patient education.   Debbora Presto, MSN, FNP-C 10/05/2022, 10:31 AM  [facility] [address], [place], [place] [address] [phone]

## 2022-10-02 ENCOUNTER — Encounter (INDEPENDENT_AMBULATORY_CARE_PROVIDER_SITE_OTHER): Payer: Self-pay | Admitting: Ophthalmology

## 2022-10-02 ENCOUNTER — Ambulatory Visit (INDEPENDENT_AMBULATORY_CARE_PROVIDER_SITE_OTHER): Payer: Medicare PPO | Admitting: Ophthalmology

## 2022-10-02 DIAGNOSIS — H40003 Preglaucoma, unspecified, bilateral: Secondary | ICD-10-CM | POA: Diagnosis not present

## 2022-10-02 DIAGNOSIS — Z961 Presence of intraocular lens: Secondary | ICD-10-CM | POA: Diagnosis not present

## 2022-10-02 DIAGNOSIS — I10 Essential (primary) hypertension: Secondary | ICD-10-CM | POA: Diagnosis not present

## 2022-10-02 DIAGNOSIS — H34831 Tributary (branch) retinal vein occlusion, right eye, with macular edema: Secondary | ICD-10-CM | POA: Diagnosis not present

## 2022-10-02 DIAGNOSIS — H35373 Puckering of macula, bilateral: Secondary | ICD-10-CM | POA: Diagnosis not present

## 2022-10-02 DIAGNOSIS — H35033 Hypertensive retinopathy, bilateral: Secondary | ICD-10-CM

## 2022-10-02 DIAGNOSIS — H40051 Ocular hypertension, right eye: Secondary | ICD-10-CM | POA: Diagnosis not present

## 2022-10-02 DIAGNOSIS — E119 Type 2 diabetes mellitus without complications: Secondary | ICD-10-CM

## 2022-10-02 MED ORDER — AFLIBERCEPT 2MG/0.05ML IZ SOLN FOR KALEIDOSCOPE
2.0000 mg | INTRAVITREAL | Status: AC | PRN
  Administered 2022-10-02: 2 mg via INTRAVITREAL

## 2022-10-05 ENCOUNTER — Ambulatory Visit: Payer: Medicare PPO | Admitting: Family Medicine

## 2022-10-05 ENCOUNTER — Encounter: Payer: Self-pay | Admitting: Family Medicine

## 2022-10-05 VITALS — BP 147/86 | HR 88 | Ht 62.0 in | Wt 128.5 lb

## 2022-10-05 DIAGNOSIS — F0280 Dementia in other diseases classified elsewhere without behavioral disturbance: Secondary | ICD-10-CM | POA: Diagnosis not present

## 2022-10-05 DIAGNOSIS — G301 Alzheimer's disease with late onset: Secondary | ICD-10-CM

## 2022-10-05 MED ORDER — MEMANTINE HCL ER 14 MG PO CP24: 14.0000 mg | ORAL_CAPSULE | Freq: Every day | ORAL | 3 refills | Status: DC

## 2022-10-07 DIAGNOSIS — L603 Nail dystrophy: Secondary | ICD-10-CM | POA: Diagnosis not present

## 2022-10-07 DIAGNOSIS — I739 Peripheral vascular disease, unspecified: Secondary | ICD-10-CM | POA: Diagnosis not present

## 2022-10-16 ENCOUNTER — Ambulatory Visit (HOSPITAL_COMMUNITY)
Admission: RE | Admit: 2022-10-16 | Discharge: 2022-10-16 | Disposition: A | Discharge status: Home / Self Care | Payer: Medicare PPO | Source: Ambulatory Visit | Attending: Vascular Surgery | Admitting: Vascular Surgery

## 2022-10-16 DIAGNOSIS — Z90710 Acquired absence of both cervix and uterus: Secondary | ICD-10-CM | POA: Diagnosis not present

## 2022-10-16 DIAGNOSIS — E109 Type 1 diabetes mellitus without complications: Secondary | ICD-10-CM | POA: Diagnosis not present

## 2022-10-16 DIAGNOSIS — I71012 Dissection of descending thoracic aorta: Secondary | ICD-10-CM | POA: Insufficient documentation

## 2022-10-16 DIAGNOSIS — I2510 Atherosclerotic heart disease of native coronary artery without angina pectoris: Secondary | ICD-10-CM | POA: Diagnosis not present

## 2022-10-16 DIAGNOSIS — I719 Aortic aneurysm of unspecified site, without rupture: Secondary | ICD-10-CM | POA: Insufficient documentation

## 2022-10-16 DIAGNOSIS — I10 Essential (primary) hypertension: Secondary | ICD-10-CM | POA: Insufficient documentation

## 2022-10-16 DIAGNOSIS — I712 Thoracic aortic aneurysm, without rupture, unspecified: Secondary | ICD-10-CM | POA: Diagnosis not present

## 2022-10-16 DIAGNOSIS — N323 Diverticulum of bladder: Secondary | ICD-10-CM | POA: Diagnosis not present

## 2022-10-16 DIAGNOSIS — N8189 Other female genital prolapse: Secondary | ICD-10-CM | POA: Diagnosis not present

## 2022-10-16 DIAGNOSIS — I700 Atherosclerosis of aorta: Secondary | ICD-10-CM | POA: Diagnosis not present

## 2022-10-16 MED ORDER — IOHEXOL 350 MG/ML SOLN
80.0000 mL | Freq: Once | INTRAVENOUS | Status: AC | PRN
  Administered 2022-10-16: 80 mL via INTRAVENOUS

## 2022-10-19 LAB — POCT I-STAT CREATININE: Creatinine, Ser: 1.3 mg/dL — ABNORMAL HIGH (ref 0.44–1.00)

## 2022-10-22 ENCOUNTER — Encounter: Payer: Self-pay | Admitting: Vascular Surgery

## 2022-10-22 ENCOUNTER — Other Ambulatory Visit (HOSPITAL_COMMUNITY): Payer: Medicare PPO

## 2022-10-22 ENCOUNTER — Ambulatory Visit: Payer: Medicare PPO | Admitting: Vascular Surgery

## 2022-10-22 VITALS — BP 127/82 | HR 93 | Temp 98.2°F | Resp 20 | Ht 62.0 in | Wt 124.0 lb

## 2022-10-22 DIAGNOSIS — I719 Aortic aneurysm of unspecified site, without rupture: Secondary | ICD-10-CM | POA: Diagnosis not present

## 2022-10-22 NOTE — Progress Notes (Signed)
REASON FOR VISIT:   Follow-up of ulceration the descending thoracic aorta.  MEDICAL ISSUES:   FOCAL DISSECTION OF THE DESCENDING THORACIC AORTA: There has been no significant change in the irregularity in the descending thoracic aorta.  This was originally noted in September 2020 so its been 3 years now.  I have recommended a follow-up CT angio of the chest abdomen pelvis in 1 year and we will see her back at that time.  I have explained to the patient and her daughter that I will be retiring so one of my partners will be seeing her back at that time.  If she develops any sudden chest or back pain she knows to get to the emergency department.  She is not a smoker.  Her blood pressure is under good control.  HPI:   Alejandra Davis is a pleasant 86 y.o. female who I last saw on 12/18/2021.  She had presented initially with a GI bleed which prompted a CT scan of the abdomen and pelvis.  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  When I saw her last, the CT scan showed that the ulcer had progressed slightly.  She had a focal dissection of the descending thoracic aorta but no evidence of aneurysmal degeneration and no significant narrowing.  When I reviewed the films from September 2020 I did not think there been a significant change compared to her most recent study.  She had palpable pedal pulses.  I ordered a follow-up CT angio of the chest abdomen pelvis in 9 months and she comes in to discuss those results.  Since I saw her last she denies any chest pain, abdominal pain, or back pain.  Her blood pressure has been under good control.  She is not a smoker.  There have been no significant changes to her medical history.  Of note today is her birthday!  Past Medical History:  Diagnosis Date   Chicken pox    Cystocele    Diabetes mellitus without complication ([facility])    Glaucoma    Hypertension    Hypertensive retinopathy    OU   Neuromuscular disorder ([place])    Dementia     Thyroid disease    hypothyroidism    Family History  Problem Relation Age of Onset   Sickle cell anemia Other    Hypertension Mother    Cancer Father        LIVER   Heart disease Brother    Cancer Brother        STOMACH    SOCIAL HISTORY: Social History   Tobacco Use   Smoking status: Never   Smokeless tobacco: Never  Substance Use Topics   Alcohol use: No    Allergies  Allergen Reactions   Donepezil Hcl     Stomach cramps     Current Outpatient Medications  Medication Sig Dispense Refill   amLODipine (NORVASC) 10 MG tablet TAKE 1 TABLET(10 MG) BY MOUTH DAILY 90 tablet 1   ASPIRIN LOW DOSE 81 MG EC tablet Take 81 mg by mouth daily.     dorzolamide-timolol (COSOPT) 22.3-6.8 MG/ML ophthalmic solution Place 1 drop into both eyes 2 (two) times daily.     levothyroxine (SYNTHROID) 75 MCG tablet Take 1 tablet (75 mcg total) by mouth daily. 90 tablet 1   lisinopril (ZESTRIL) 2.5 MG tablet Take 1 tablet (2.5 mg total) by mouth daily. 90 tablet 1   loratadine (CLARITIN) 10 MG tablet TAKE 1 TABLET(10  MG) BY MOUTH DAILY 90 tablet 1   memantine (NAMENDA XR) 14 MG CP24 24 hr capsule Take 1 capsule (14 mg total) by mouth daily. 30 capsule 3   metoprolol tartrate (LOPRESSOR) 50 MG tablet TAKE 1 TABLET(50 MG) BY MOUTH DAILY 90 tablet 1   No current facility-administered medications for this visit.    REVIEW OF SYSTEMS:  '[X]'$  denotes positive finding, '[ ]'$  denotes negative finding Cardiac  Comments:  Chest pain or chest pressure:    Shortness of breath upon exertion:    Short of breath when lying flat:    Irregular heart rhythm:        Vascular    Pain in calf, thigh, or hip brought on by ambulation:    Pain in feet at night that wakes you up from your sleep:     Blood clot in your veins:    Leg swelling:         Pulmonary    Oxygen at home:    Productive cough:     Wheezing:         Neurologic    Sudden weakness in arms or legs:     Sudden numbness in arms or legs:      Sudden onset of difficulty speaking or slurred speech:    Temporary loss of vision in one eye:     Problems with dizziness:         Gastrointestinal    Blood in stool:     Vomited blood:         Genitourinary    Burning when urinating:     Blood in urine:        Psychiatric    Major depression:         Hematologic    Bleeding problems:    Problems with blood clotting too easily:        Skin    Rashes or ulcers:        Constitutional    Fever or chills:     PHYSICAL EXAM:   Vitals:   10/22/22 1021  BP: 127/82  Pulse: 93  Resp: 20  Temp: 98.2 F (36.8 C)  SpO2: 97%  Weight: 124 lb (56.2 kg)  Height: '5\' 2"'$  (1.575 m)    GENERAL: The patient is a well-nourished female, in no acute distress. The vital signs are documented above. CARDIAC: There is a regular rate and rhythm.  VASCULAR: I do not detect carotid bruits. She has palpable dorsalis pedis pulses bilaterally. PULMONARY: There is good air exchange bilaterally without wheezing or rales. ABDOMEN: Soft and non-tender with normal pitched bowel sounds.  I do not palpate any aneurysm. MUSCULOSKELETAL: There are no major deformities or cyanosis. NEUROLOGIC: No focal weakness or paresthesias are detected. SKIN: There are no ulcers or rashes noted. PSYCHIATRIC: The patient has a normal affect.  DATA:    CT CHEST ABDOMEN PELVIS: I have reviewed the images of the CT of the chest abdomen pelvis.  The penetrating ulceration in the distal descending thoracic aorta is stable in appearance.  There is no aneurysmal degeneration at this level.  Again it looks to me like there is a focal dissection in this area.  Deitra Mayo [facility] [phone]

## 2022-10-25 ENCOUNTER — Telehealth: Payer: Medicare PPO | Admitting: Family

## 2022-10-25 DIAGNOSIS — M545 Low back pain, unspecified: Secondary | ICD-10-CM

## 2022-10-25 MED ORDER — DICLOFENAC SODIUM 75 MG PO TBEC: 75.0000 mg | DELAYED_RELEASE_TABLET | Freq: Two times a day (BID) | ORAL | 0 refills | Status: DC

## 2022-10-25 NOTE — Progress Notes (Signed)
Virtual Visit Consent   Alejandra Davis, you are scheduled for a virtual visit with a [facility] provider today. Just as with appointments in the office, your consent must be obtained to participate. Your consent will be active for this visit and any virtual visit you may have with one of our providers in the next 365 days. If you have a MyChart account, a copy of this consent can be sent to you electronically.  As this is a virtual visit, video technology does not allow for your provider to perform a traditional examination. This may limit your provider's ability to fully assess your condition. If your provider identifies any concerns that need to be evaluated in person or the need to arrange testing (such as labs, EKG, etc.), we will make arrangements to do so. Although advances in technology are sophisticated, we cannot ensure that it will always work on either your end or our end. If the connection with a video visit is poor, the visit may have to be switched to a telephone visit. With either a video or telephone visit, we are not always able to ensure that we have a secure connection.  By engaging in this virtual visit, you consent to the provision of healthcare and authorize for your insurance to be billed (if applicable) for the services provided during this visit. Depending on your insurance coverage, you may receive a charge related to this service.  I need to obtain your verbal consent now. Are you willing to proceed with your visit today? Alejandra Davis has provided verbal consent on 10/25/2022 for a virtual visit (video or telephone). Evelina Dun, FNP  Date: 10/25/2022 3:56 PM  Virtual Visit via Video Note   I, Evelina Dun, connected with  Alejandra Davis  ([id number], [id number]) on 10/25/22 at  4:00 PM EDT by a video-enabled telemedicine application and verified that I am speaking with the correct person using two identifiers.  Location: Patient: Virtual Visit Location Patient:  Home Provider: Virtual Visit Location Provider: Home Office   I discussed the limitations of evaluation and management by telemedicine and the availability of in person appointments. The patient expressed understanding and agreed to proceed.    History of Present Illness: Alejandra Davis is a 86 y.o. who identifies as a female who was assigned female at birth, and is being seen today for back pain.  HPI: Back Pain This is a new problem. The current episode started today. The problem occurs constantly. The problem is unchanged. The pain is present in the lumbar spine. The quality of the pain is described as aching. The pain does not radiate. The pain is at a severity of 8/10. The pain is moderate. The symptoms are aggravated by standing and twisting. Pertinent negatives include no bladder incontinence, bowel incontinence, chest pain, dysuria, fever, pelvic pain, perianal numbness, tingling or weakness. She has tried nothing for the symptoms. The treatment provided no relief.    Problems:  Patient Active Problem List   Diagnosis Date Noted   Seborrheic dermatitis 08/27/2022   Rectal bleeding 04/10/2021   Chronic kidney disease, stage 3a ([place]) 04/06/2021   Late onset Alzheimer's dementia without behavioral disturbance ([place]) 04/06/2021   Sinus tachycardia 04/06/2021   Lower GI bleed 04/06/2021   Acute lower GI bleeding 04/05/2021   Stage 3b chronic kidney disease ([place]) 09/17/2020   Hyperlipidemia associated with type 2 diabetes mellitus ([place]) 09/17/2020   Flexural atopic dermatitis 05/30/2019   Presbycusis of both ears 05/11/2019  Acquired hypothyroidism 03/13/2019   Murmur 09/01/2018   Telogen effluvium 06/07/2018   Eczema of scalp 03/31/2018   Xerosis cutis 02/25/2018   Immunization reaction 02/08/2018   Keratosis pilaris 02/08/2018   Health care maintenance 02/07/2018   Controlled type 2 diabetes mellitus without complication, without long-term current use of insulin ([place] [facility]) 02/07/2018    Cystocele    Osteopenia    Essential hypertension     Allergies:  Allergies  Allergen Reactions   Donepezil Hcl     Stomach cramps    Medications:  Current Outpatient Medications:    diclofenac (VOLTAREN) 75 MG EC tablet, Take 1 tablet (75 mg total) by mouth 2 (two) times daily., Disp: 20 tablet, Rfl: 0   amLODipine (NORVASC) 10 MG tablet, TAKE 1 TABLET(10 MG) BY MOUTH DAILY, Disp: 90 tablet, Rfl: 1   ASPIRIN LOW DOSE 81 MG EC tablet, Take 81 mg by mouth daily., Disp: , Rfl:    dorzolamide-timolol (COSOPT) 22.3-6.8 MG/ML ophthalmic solution, Place 1 drop into both eyes 2 (two) times daily., Disp: , Rfl:    levothyroxine (SYNTHROID) 75 MCG tablet, Take 1 tablet (75 mcg total) by mouth daily., Disp: 90 tablet, Rfl: 1   lisinopril (ZESTRIL) 2.5 MG tablet, Take 1 tablet (2.5 mg total) by mouth daily., Disp: 90 tablet, Rfl: 1   loratadine (CLARITIN) 10 MG tablet, TAKE 1 TABLET(10 MG) BY MOUTH DAILY, Disp: 90 tablet, Rfl: 1   memantine (NAMENDA XR) 14 MG CP24 24 hr capsule, Take 1 capsule (14 mg total) by mouth daily., Disp: 30 capsule, Rfl: 3   metoprolol tartrate (LOPRESSOR) 50 MG tablet, TAKE 1 TABLET(50 MG) BY MOUTH DAILY, Disp: 90 tablet, Rfl: 1  Observations/Objective: Patient is well-developed, well-nourished in no acute distress.  Resting comfortably  at home.  Head is normocephalic, atraumatic.  No labored breathing.  Speech is clear and coherent with logical content.  Patient is alert and oriented at baseline.  Full ROM of back, pain with flexion  Assessment and Plan: 1. Acute bilateral low back pain, unspecified whether sciatica present - diclofenac (VOLTAREN) 75 MG EC tablet; Take 1 tablet (75 mg total) by mouth 2 (two) times daily.  Dispense: 20 tablet; Refill: 0  Will give diclofenac BID with food No other NSAID's Will hold off muscle relaxer given dementia and living alone ROM exercises  She will follow up with PCP this week  Follow Up Instructions: I discussed  the assessment and treatment plan with the patient. The patient was provided an opportunity to ask questions and all were answered. The patient agreed with the plan and demonstrated an understanding of the instructions.  A copy of instructions were sent to the patient via MyChart unless otherwise noted below.     The patient was advised to call back or seek an in-person evaluation if the symptoms worsen or if the condition fails to improve as anticipated.  Time:  I spent 10 minutes with the patient via telehealth technology discussing the above problems/concerns.    Evelina Dun, FNP

## 2022-10-26 DIAGNOSIS — S90521A Blister (nonthermal), right ankle, initial encounter: Secondary | ICD-10-CM | POA: Diagnosis not present

## 2022-10-26 DIAGNOSIS — T148XXA Other injury of unspecified body region, initial encounter: Secondary | ICD-10-CM | POA: Diagnosis not present

## 2022-10-26 DIAGNOSIS — M545 Low back pain, unspecified: Secondary | ICD-10-CM | POA: Diagnosis not present

## 2022-10-26 DIAGNOSIS — Z6822 Body mass index (BMI) 22.0-22.9, adult: Secondary | ICD-10-CM | POA: Diagnosis not present

## 2022-10-28 NOTE — Progress Notes (Signed)
[facility] Note  11/02/2022    CHIEF COMPLAINT Patient presents for Retina Follow Up  HISTORY OF PRESENT ILLNESS: Alejandra Davis is a 86 y.o. female who presents to the clinic today for:  HPI     Retina Follow Up   Patient presents with  CRVO/BRVO.  In right eye.  Severity is moderate.  Duration of 4 weeks.  Since onset it is stable.  I, the attending physician,  performed the HPI with the patient and updated documentation appropriately.        Comments   Pt here for 4 wk ret f/u BRVO OD. Pt states VA the same, no changes.       Last edited by [name], MD on 11/02/2022  4:17 PM.     Patient feels that the vision is the same.   Referring physician: Faustino Congress, NP [place],  [place] [address]  HISTORICAL INFORMATION:   Selected notes from the MEDICAL RECORD NUMBER Referred by [name] for DM exam   CURRENT MEDICATIONS: Current Outpatient Medications (Ophthalmic Drugs)  Medication Sig   dorzolamide-timolol (COSOPT) 22.3-6.8 MG/ML ophthalmic solution Place 1 drop into both eyes 2 (two) times daily.   No current facility-administered medications for this visit. (Ophthalmic Drugs)   Current Outpatient Medications (Other)  Medication Sig   amLODipine (NORVASC) 10 MG tablet TAKE 1 TABLET(10 MG) BY MOUTH DAILY   ASPIRIN LOW DOSE 81 MG EC tablet Take 81 mg by mouth daily.   diclofenac (VOLTAREN) 75 MG EC tablet Take 1 tablet (75 mg total) by mouth 2 (two) times daily.   levothyroxine (SYNTHROID) 75 MCG tablet Take 1 tablet (75 mcg total) by mouth daily.   lisinopril (ZESTRIL) 2.5 MG tablet Take 1 tablet (2.5 mg total) by mouth daily.   loratadine (CLARITIN) 10 MG tablet TAKE 1 TABLET(10 MG) BY MOUTH DAILY   memantine (NAMENDA XR) 14 MG CP24 24 hr capsule Take 1 capsule (14 mg total) by mouth daily.   metoprolol tartrate (LOPRESSOR) 50 MG tablet TAKE 1 TABLET(50 MG) BY MOUTH DAILY   No current  facility-administered medications for this visit. (Other)   REVIEW OF SYSTEMS: ROS   Positive for: Skin, Genitourinary, Musculoskeletal, Endocrine, Cardiovascular, Eyes Negative for: Constitutional, Gastrointestinal, Neurological, HENT, Respiratory, Psychiatric, Allergic/Imm, Heme/Lymph Last edited by Kingsley Spittle, COT on 11/02/2022  9:17 AM.     ALLERGIES Allergies  Allergen Reactions   Donepezil Hcl     Stomach cramps    PAST MEDICAL HISTORY Past Medical History:  Diagnosis Date   Chicken pox    Cystocele    Diabetes mellitus without complication ([facility])    Glaucoma    Hypertension    Hypertensive retinopathy    OU   Neuromuscular disorder ([place])    Dementia    Thyroid disease    hypothyroidism   Past Surgical History:  Procedure Laterality Date   ABDOMINAL HYSTERECTOMY  1990   TAH BSO   CATARACT EXTRACTION Bilateral    Dr. [name]   COLONOSCOPY WITH PROPOFOL N/A 04/08/2021   Procedure: COLONOSCOPY WITH PROPOFOL;  Surgeon: [name], MD;  Location: [facility] END[facility]COPY;  Service: Endoscopy;  Laterality: N/A;   EYE SURGERY Bilateral    Cat Sx OU   OOPHORECTOMY     BSO   Vaginal Bx     Papilloma   FAMILY HISTORY Family History  Problem Relation Age of Onset   Sickle cell anemia Other  Hypertension Mother    Cancer Father        LIVER   Heart disease Brother    Cancer Brother        STOMACH   SOCIAL HISTORY Social History   Tobacco Use   Smoking status: Never   Smokeless tobacco: Never  Vaping Use   Vaping Use: Never used  Substance Use Topics   Alcohol use: No   Drug use: No       OPHTHALMIC EXAM: Base Eye Exam     Visual Acuity ([facility]ellen - Linear)       Right Left   Dist cc 20/100 -2 20/40 -2   Dist ph cc NI 20/25 -2    Correction: Glasses         Tonometry (Tonopen, 9:26 AM)       Right Left   Pressure 7 9         Pupils       Dark Light Shape React APD   Right 3 2 Round Brisk None   Left 3 2 Round Brisk None          Visual Fields (Counting fingers)       Left Right    Full    Restrictions  Partial outer superior temporal, inferior temporal deficiencies         Extraocular Movement       Right Left    Full, Ortho Full, Ortho         Neuro/Psych     Oriented x3: Yes   Mood/Affect: Normal         Dilation     Both eyes: 1.0% Mydriacyl, 2.5% Phenylephrine @ 9:27 AM           Slit Lamp and Fundus Exam     Slit Lamp Exam       Right Left   Lids/Lashes Dermatochalasis - upper lid, Meibomian gland dysfunction Dermatochalasis - upper lid, Meibomian gland dysfunction   Conjunctiva/Sclera Melanosis Melanosis   Cornea Arcus, trace Punctate epithelial erosions, tear film debris, 1+ [place], Well healed temporal cataract wound Arcus, Inferior 1+ Punctate epithelial erosions, well healed cataract wound   Anterior Chamber Deep and quiet Deep;  no cell or flare   Iris Round and dilated, No NVI Round and dilated, No NVI   Lens PC IOL in good position, 2+ Posterior capsular opacification Posterior chamber intraocular lens in good position, 1-2+ PCO.   Anterior Vitreous Vitreous syneresis, vitreous condensations, silicone oil bubbles Vitreous syneresis, Vitreous condensations         Fundus Exam       Right Left   Disc Sharp rim, inferior notch, 3+ pallor, inf. Rim thinning, attenuated vessels superiorly, +disc heme at 1100 -- persistent, fine vascular loops superiorly 2+ pallor, Sharp rim, thin inferior rim   C/D Ratio 0.85 0.75   Macula Blunted foveal reflex, edema/cystic changes superior mac; stable improvement in DBH superior mac, central exudates, fibrosis and atrophy Flat, good foveal reflex, mild RPE mottling and clumping, Epiretinal membrane, No heme or edema   Vessels superior BRVO with severe attenuation of ST arterioles, Tortuous attenuated, Tortuous   Periphery Attached, DBH superior hemisphere extended from BRVO--slightly improved Attached, mild, scattered Reticular  degeneration, No heme           IMAGING AND PROCEDURES  Imaging and Procedures for 03/07/18  OCT, Retina - OU - Both Eyes       Right Eye Quality was good. Central Foveal Thickness: 220. Progression  has improved. Findings include no SRF, abnormal foveal contour, subretinal hyper-reflective material, intraretinal hyper-reflective material, epiretinal membrane, intraretinal fluid, outer retinal atrophy, vitreomacular adhesion (Interval improvement in IRF/cystic changes, central SRHM and patchy ORA; diffuse retinal thinning--stable ).   Left Eye Quality was good. Central Foveal Thickness: 250. Progression has been stable. Findings include no IRF, no SRF, abnormal foveal contour, epiretinal membrane, vitreomacular adhesion (Blunted foveal depression -- stable).   Notes *Images captured and stored on drive  Diagnosis / Impression:  ERM OU OD: BRVO w/ CME -- Interval improvement in IRF/cystic changes, central SRHM and patchy ORA; diffuse retinal thinning--stable OS: very mild ERM with blunted foveal depression -- stable; mild VMA  Clinical management:  See below  Abbreviations: NFP - Normal foveal profile. CME - cystoid macular edema. PED - pigment epithelial detachment. IRF - intraretinal fluid. SRF - subretinal fluid. EZ - ellipsoid zone. ERM - epiretinal membrane. ORA - outer retinal atrophy. ORT - outer retinal tubulation. SRHM - subretinal hyper-reflective material       Intravitreal Injection, Pharmacologic Agent - OD - Right Eye       Time Out 11/02/2022. 10:26 AM. Confirmed correct patient, procedure, site, and patient consented.   Anesthesia Topical anesthesia was used. Anesthetic medications included Lidocaine 2%, Proparacaine 0.5%.   Procedure Preparation included 5% betadine to ocular surface, eyelid speculum. A (32g) needle was used.   Injection: 2 mg aflibercept 2 MG/0.05ML   Route: Intravitreal, Site: Right Eye   NDC: [id number], Lot: [id number],  Expiration date: 01/27/2024, Waste: 0 mL   Post-op Post injection exam found visual acuity of at least counting fingers. The patient tolerated the procedure well. There were no complications. The patient received written and verbal post procedure care education. Post injection medications were not given.            ASSESSMENT/PLAN:   ICD-10-CM   1. Branch retinal vein occlusion of right eye with macular edema  H34.8310 OCT, Retina - OU - Both Eyes    Intravitreal Injection, Pharmacologic Agent - OD - Right Eye    aflibercept (EYLEA) SOLN 2 mg    2. Diabetes mellitus type 2 without retinopathy ([facility])  E11.9     3. Epiretinal membrane (ERM) of both eyes  H35.373     4. Pseudophakia of both eyes  Z96.1     5. Ocular hypertension of right eye  H40.051     6. Glaucoma suspect of both eyes  H40.003     7. Essential hypertension  I10     8. Hypertensive retinopathy of both eyes  H35.033      1. BRVO with CME OD  - lost to follow up from 02.22.22 to 11.1.22 -- re-presented with massive CME and BCVA 20/400 from 20/30  - initial OCT w/ CME superior macula  - s/p IVA OD #1 (10.18.19), #2 (11.19.19), #3 (12.17.19), #4 (02.19.20), #5 (03.23.20), #6 (04.21.20), #7 (05.27.20), #8 (07.07.20), #9 (08.25.20), #10 (9.29.20), #11 (11.17.20), #12 (12.22.20), #13 (01.27.21), #14 (03.10.21), #15 (04.21.21), #16 (05.26.21), #17 (06.30.21), #18 (08.04.21), #19 (09.01.21), #20 (10.06.21), #21 (11.17.21), #22 (12.30.21), #23 (2.22.22), #24 (11.1.22), #25 (11.29.22), #26 (01.03.23), #27 (02.14.23), #28 (04.07.23), #29 (05.19.23), #30 (06.20.23) -- IVA resistance  **history of worsening IRF/cystic changes at 6+ weeks interval, IVA**  - s/p IVE OD #1 (08.11.23), #2 (09.08.23), #3 (10.06.23)  - BCVA OD stable at 20/100  - OCT shows Interval improvement in IRF/cystic changes, central SRHM and patchy ORA; diffuse retinal thinning--stable  - recommend IVE OD #  4 today, 11.06.23, ext f/u to 5wks.   - RBA of  procedure discussed, questions answered - informed consent obtained and signed - see procedure note  - [place] informed consent form re-signed and scanned on 05.19.23  - Eylea informed consent form signed and scanned on 08.11.23  - f/u ext to 5 weeks -- DFE/OCT, possible injection   2. Diabetes mellitus, type 2 without retinopathy  - The incidence, risk factors for progression, natural history and treatment options for diabetic retinopathy  were discussed with patient.    - The need for close monitoring of blood glucose, blood pressure, and serum lipids, avoiding cigarette or any type of tobacco, and the need for long term follow up was also discussed with patient.    3. Epiretinal membrane, OU  - relatively mild ERM OU -- blunted central foveal depression  - OD with mild cystic changes 2/2 BRVO as above; OS without cystic changes or edema  - discussed findings and prognosis   - continue to monitor  4. Pseudophakia OU  - s/p CE/IOL OS 11.13.19 w/ Dr. [name]  - s/p CE/IOL OD 01.16.20 w/ Dr. [name]  - beautiful surgeries -- IOLs in perfect position  - healing well post-operatively  - IRF/CME OD may have been partly due to post op CME ([place]) in addition to BRVO  5,6. Ocular hypertension / Glaucoma suspect OD>OS  - IOP ~25 OD, 20 OS at prior clinic visits  - today IOP 7,9   - denies any family hx of Glaucoma   - on cosopt and brimonidine bid OU per Dr. Lucianne Lei  7,8. Hypertensive retinopathy OU  - pt presented to 6.24.2020 visit urgently for right sided headache above right eye with extension to occiput  - BP at that time was 201/100 -- pt was sent to primary care clinic for urgent evaluation and meds were adjusted  - BP now under better control and headaches improved  - BP reading 11.1.22 -- 180/84 -- advised f/u with PCP  - discussed importance of tight BP control  Ophthalmic Meds Ordered this visit:  Meds ordered this encounter  Medications   aflibercept (EYLEA) SOLN 2 mg      Return in about 5 weeks (around 12/07/2022) for BRVO OD, DFE, OCT, likely injection OD.  There are no Patient Instructions on file for this visit.  This document serves as a record of services personally performed by Gardiner Sleeper, MD, PhD. It was created on their behalf by Roselee Nova, COMT. The creation of this record is the provider's dictation and/or activities during the visit.  Electronically signed by: Roselee Nova, COMT 11/02/22 4:19 PM   This document serves as a record of services personally performed by Gardiner Sleeper, MD, PhD. It was created on their behalf by Orvan Falconer, an ophthalmic technician. The creation of this record is the provider's dictation and/or activities during the visit.    Electronically signed by: Orvan Falconer, OA, 11/02/22  4:19 PM  Gardiner Sleeper, M.D., Ph.D. Diseases & Surgery of the Retina and Vitreous [facility]  I have reviewed the above documentation for accuracy and completeness, and I agree with the above. Gardiner Sleeper, M.D., Ph.D. 11/02/22 4:21 PM  Abbreviations: M myopia (nearsighted); A astigmatism; H hyperopia (farsighted); P presbyopia; Mrx spectacle prescription;  CTL contact lenses; OD right eye; OS left eye; OU both eyes  XT exotropia; ET esotropia; PEK punctate epithelial keratitis; PEE punctate epithelial erosions; DES dry eye syndrome; MGD  meibomian gland dysfunction; ATs artificial tears; PFAT's preservative free artificial tears; [place] nuclear sclerotic cataract; PSC posterior subcapsular cataract; ERM epi-retinal membrane; PVD posterior vitreous detachment; RD retinal detachment; DM diabetes mellitus; DR diabetic retinopathy; NPDR non-proliferative diabetic retinopathy; PDR proliferative diabetic retinopathy; CSME clinically significant macular edema; DME diabetic macular edema; dbh dot blot hemorrhages; CWS cotton wool spot; POAG primary open angle glaucoma; C/D cup-to-disc ratio; HVF humphrey  visual field; GVF goldmann visual field; OCT optical coherence tomography; IOP intraocular pressure; BRVO Branch retinal vein occlusion; CRVO central retinal vein occlusion; CRAO central retinal artery occlusion; BRAO branch retinal artery occlusion; RT retinal tear; SB scleral buckle; PPV pars plana vitrectomy; VH Vitreous hemorrhage; PRP panretinal laser photocoagulation; IVK intravitreal kenalog; VMT vitreomacular traction; MH Macular hole;  NVD neovascularization of the disc; NVE neovascularization elsewhere; AREDS age related eye disease study; ARMD age related macular degeneration; POAG primary open angle glaucoma; EBMD epithelial/anterior basement membrane dystrophy; ACIOL anterior chamber intraocular lens; IOL intraocular lens; PCIOL posterior chamber intraocular lens; Phaco/IOL phacoemulsification with intraocular lens placement; [place] photorefractive keratectomy; LASIK laser assisted in situ keratomileusis; HTN hypertension; DM diabetes mellitus; COPD chronic obstructive pulmonary disease

## 2022-11-02 ENCOUNTER — Ambulatory Visit (INDEPENDENT_AMBULATORY_CARE_PROVIDER_SITE_OTHER): Payer: Medicare PPO | Admitting: Ophthalmology

## 2022-11-02 ENCOUNTER — Encounter (INDEPENDENT_AMBULATORY_CARE_PROVIDER_SITE_OTHER): Payer: Self-pay | Admitting: Ophthalmology

## 2022-11-02 DIAGNOSIS — I10 Essential (primary) hypertension: Secondary | ICD-10-CM

## 2022-11-02 DIAGNOSIS — H40051 Ocular hypertension, right eye: Secondary | ICD-10-CM

## 2022-11-02 DIAGNOSIS — H35033 Hypertensive retinopathy, bilateral: Secondary | ICD-10-CM

## 2022-11-02 DIAGNOSIS — Z961 Presence of intraocular lens: Secondary | ICD-10-CM

## 2022-11-02 DIAGNOSIS — H401133 Primary open-angle glaucoma, bilateral, severe stage: Secondary | ICD-10-CM | POA: Diagnosis not present

## 2022-11-02 DIAGNOSIS — H34831 Tributary (branch) retinal vein occlusion, right eye, with macular edema: Secondary | ICD-10-CM | POA: Diagnosis not present

## 2022-11-02 DIAGNOSIS — H35373 Puckering of macula, bilateral: Secondary | ICD-10-CM

## 2022-11-02 DIAGNOSIS — H40003 Preglaucoma, unspecified, bilateral: Secondary | ICD-10-CM

## 2022-11-02 DIAGNOSIS — E119 Type 2 diabetes mellitus without complications: Secondary | ICD-10-CM

## 2022-11-02 MED ORDER — AFLIBERCEPT 2MG/0.05ML IZ SOLN FOR KALEIDOSCOPE
2.0000 mg | INTRAVITREAL | Status: AC | PRN
  Administered 2022-11-02: 2 mg via INTRAVITREAL

## 2022-11-04 DIAGNOSIS — R5383 Other fatigue: Secondary | ICD-10-CM | POA: Diagnosis not present

## 2022-11-04 DIAGNOSIS — E1169 Type 2 diabetes mellitus with other specified complication: Secondary | ICD-10-CM | POA: Diagnosis not present

## 2022-11-04 DIAGNOSIS — S90521A Blister (nonthermal), right ankle, initial encounter: Secondary | ICD-10-CM | POA: Diagnosis not present

## 2022-11-04 DIAGNOSIS — Z6822 Body mass index (BMI) 22.0-22.9, adult: Secondary | ICD-10-CM | POA: Diagnosis not present

## 2022-11-04 DIAGNOSIS — Z0000 Encounter for general adult medical examination without abnormal findings: Secondary | ICD-10-CM | POA: Diagnosis not present

## 2022-11-04 DIAGNOSIS — I10 Essential (primary) hypertension: Secondary | ICD-10-CM | POA: Diagnosis not present

## 2022-11-04 DIAGNOSIS — E039 Hypothyroidism, unspecified: Secondary | ICD-10-CM | POA: Diagnosis not present

## 2022-11-04 DIAGNOSIS — Z23 Encounter for immunization: Secondary | ICD-10-CM | POA: Diagnosis not present

## 2022-11-04 DIAGNOSIS — N1831 Chronic kidney disease, stage 3a: Secondary | ICD-10-CM | POA: Diagnosis not present

## 2022-11-06 ENCOUNTER — Emergency Department (HOSPITAL_BASED_OUTPATIENT_CLINIC_OR_DEPARTMENT_OTHER)
Admission: EM | Admit: 2022-11-06 | Discharge: 2022-11-06 | Disposition: A | Discharge status: Home / Self Care | Payer: Medicare PPO | Attending: Emergency Medicine | Admitting: Emergency Medicine

## 2022-11-06 ENCOUNTER — Other Ambulatory Visit: Payer: Self-pay

## 2022-11-06 ENCOUNTER — Encounter (HOSPITAL_BASED_OUTPATIENT_CLINIC_OR_DEPARTMENT_OTHER): Payer: Self-pay | Admitting: Emergency Medicine

## 2022-11-06 DIAGNOSIS — R8289 Other abnormal findings on cytological and histological examination of urine: Secondary | ICD-10-CM | POA: Diagnosis not present

## 2022-11-06 DIAGNOSIS — N1832 Chronic kidney disease, stage 3b: Secondary | ICD-10-CM | POA: Insufficient documentation

## 2022-11-06 DIAGNOSIS — M5459 Other low back pain: Secondary | ICD-10-CM | POA: Diagnosis not present

## 2022-11-06 DIAGNOSIS — G309 Alzheimer's disease, unspecified: Secondary | ICD-10-CM | POA: Insufficient documentation

## 2022-11-06 DIAGNOSIS — I129 Hypertensive chronic kidney disease with stage 1 through stage 4 chronic kidney disease, or unspecified chronic kidney disease: Secondary | ICD-10-CM | POA: Insufficient documentation

## 2022-11-06 DIAGNOSIS — Z79899 Other long term (current) drug therapy: Secondary | ICD-10-CM | POA: Insufficient documentation

## 2022-11-06 DIAGNOSIS — M545 Low back pain, unspecified: Secondary | ICD-10-CM

## 2022-11-06 DIAGNOSIS — E1122 Type 2 diabetes mellitus with diabetic chronic kidney disease: Secondary | ICD-10-CM | POA: Insufficient documentation

## 2022-11-06 DIAGNOSIS — F0280 Dementia in other diseases classified elsewhere without behavioral disturbance: Secondary | ICD-10-CM | POA: Diagnosis not present

## 2022-11-06 LAB — CBC WITH DIFFERENTIAL/PLATELET
Abs Immature Granulocytes: 0.03 10*3/uL (ref 0.00–0.07)
Basophils Absolute: 0 10*3/uL (ref 0.0–0.1)
Basophils Relative: 0 %
Eosinophils Absolute: 0.2 10*3/uL (ref 0.0–0.5)
Eosinophils Relative: 3 %
HCT: 43.2 % (ref 36.0–46.0)
Hemoglobin: 13.9 g/dL (ref 12.0–15.0)
Immature Granulocytes: 1 %
Lymphocytes Relative: 24 %
Lymphs Abs: 1.6 10*3/uL (ref 0.7–4.0)
MCH: 26.2 pg (ref 26.0–34.0)
MCHC: 32.2 g/dL (ref 30.0–36.0)
MCV: 81.5 fL (ref 80.0–100.0)
Monocytes Absolute: 0.6 10*3/uL (ref 0.1–1.0)
Monocytes Relative: 9 %
Neutro Abs: 4.3 10*3/uL (ref 1.7–7.7)
Neutrophils Relative %: 63 %
Platelets: 261 10*3/uL (ref 150–400)
RBC: 5.3 MIL/uL — ABNORMAL HIGH (ref 3.87–5.11)
RDW: 18.4 % — ABNORMAL HIGH (ref 11.5–15.5)
WBC: 6.6 10*3/uL (ref 4.0–10.5)
nRBC: 0 % (ref 0.0–0.2)

## 2022-11-06 LAB — URINALYSIS, ROUTINE W REFLEX MICROSCOPIC
Bilirubin Urine: NEGATIVE
Glucose, UA: NEGATIVE mg/dL
Ketones, ur: NEGATIVE mg/dL
Nitrite: NEGATIVE
Protein, ur: 30 mg/dL — AB
Specific Gravity, Urine: 1.013 (ref 1.005–1.030)
pH: 8 (ref 5.0–8.0)

## 2022-11-06 LAB — BASIC METABOLIC PANEL
Anion gap: 14 (ref 5–15)
BUN: 24 mg/dL — ABNORMAL HIGH (ref 8–23)
CO2: 24 mmol/L (ref 22–32)
Calcium: 10.5 mg/dL — ABNORMAL HIGH (ref 8.9–10.3)
Chloride: 104 mmol/L (ref 98–111)
Creatinine, Ser: 1.29 mg/dL — ABNORMAL HIGH (ref 0.44–1.00)
GFR, Estimated: 41 mL/min — ABNORMAL LOW (ref >60)
Glucose, Bld: 85 mg/dL (ref 70–99)
Potassium: 3.9 mmol/L (ref 3.5–5.1)
Sodium: 142 mmol/L (ref 135–145)

## 2022-11-06 NOTE — ED Provider Notes (Signed)
[facility] EMERGENCY DEPT Provider Note   CSN: [id number] Arrival date & time: 11/06/22  1028     History  Chief Complaint  Patient presents with   Back Pain    Alejandra Davis is a 86 y.o. female.  Patient presents to the emergency department complaining of low back pain which been ongoing for 1 week.  Patient was evaluated at her primary care provider and was told to use Tylenol as needed for pain.  She denies any trauma or injury.  Complains of pain which is in the left lower back region.  Denies saddle anesthesia, urinary incontinence, fecal incontinence, urinary retention.  Family with the patient states they are worried about her kidneys but other than the fact that the patient has back pain can give no reason why they are concerned of the kidneys at this time.  The patient does have a history of stage IIIb chronic kidney disease.  Denies other symptoms at this time.  Past medical history significant for stage IIIb chronic kidney disease, hypertension, type II DM, Alzheimer's dementia  HPI     Home Medications Prior to Admission medications   Medication Sig Start Date End Date Taking? Authorizing Provider  amLODipine (NORVASC) 10 MG tablet TAKE 1 TABLET(10 MG) BY MOUTH DAILY 10/14/21   Ngetich, Dinah C, NP  ASPIRIN LOW DOSE 81 MG EC tablet Take 81 mg by mouth daily. 07/13/21   [provider]  diclofenac (VOLTAREN) 75 MG EC tablet Take 1 tablet (75 mg total) by mouth 2 (two) times daily. 10/25/22   Evelina Dun A, FNP  dorzolamide-timolol (COSOPT) 22.3-6.8 MG/ML ophthalmic solution Place 1 drop into both eyes 2 (two) times daily. 10/15/20   [provider]  levothyroxine (SYNTHROID) 75 MCG tablet Take 1 tablet (75 mcg total) by mouth daily. 10/06/21   Ngetich, Dinah C, NP  lisinopril (ZESTRIL) 2.5 MG tablet Take 1 tablet (2.5 mg total) by mouth daily. 10/06/21   Ngetich, Dinah C, NP  loratadine (CLARITIN) 10 MG tablet TAKE 1 TABLET(10 MG) BY MOUTH  DAILY 10/06/21   Ngetich, Dinah C, NP  memantine (NAMENDA XR) 14 MG CP24 24 hr capsule Take 1 capsule (14 mg total) by mouth daily. 10/05/22   [name], [name], NP  metoprolol tartrate (LOPRESSOR) 50 MG tablet TAKE 1 TABLET(50 MG) BY MOUTH DAILY 10/06/21   Ngetich, Dinah C, NP      Allergies    Donepezil hcl    Review of Systems   Review of Systems  Respiratory:  Negative for shortness of breath.   Cardiovascular:  Negative for chest pain.  Gastrointestinal:  Negative for abdominal pain, nausea and vomiting.  Genitourinary:  Negative for dysuria and hematuria.  Musculoskeletal:  Positive for back pain.  Skin:  Positive for wound.       Patient complains of skin wound to the posterior right heel    Physical Exam Updated Vital Signs BP (!) 169/91   Pulse (!) 105   Temp 97.8 F (36.6 C)   Resp 16   Ht '5\' 2"'$  (1.575 m)   Wt 56.2 kg   SpO2 98%   BMI 22.68 kg/m  Physical Exam Vitals and nursing note reviewed.  Constitutional:      General: She is not in acute distress.    Appearance: She is well-developed.  HENT:     Head: Normocephalic and atraumatic.     Mouth/Throat:     Mouth: Mucous membranes are moist.  Eyes:     Conjunctiva/sclera:  Conjunctivae normal.  Cardiovascular:     Rate and Rhythm: Normal rate and regular rhythm.     Heart sounds: No murmur heard. Pulmonary:     Effort: Pulmonary effort is normal. No respiratory distress.     Breath sounds: Normal breath sounds.  Abdominal:     Palpations: Abdomen is soft.     Tenderness: There is no abdominal tenderness.  Musculoskeletal:        General: No swelling.     Cervical back: Neck supple.     Comments: Left sided positive SLR  Skin:    General: Skin is warm.     Capillary Refill: Capillary refill takes less than 2 seconds.     Comments: What appears to be healing blister posterior right heel, no erythema or drainage noted at this time.  Neurological:     Mental Status: She is alert. Mental status is at baseline.   Psychiatric:        Mood and Affect: Mood normal.     ED Results / Procedures / Treatments   Labs (all labs ordered are listed, but only abnormal results are displayed) Labs Reviewed  BASIC METABOLIC PANEL - Abnormal; Notable for the following components:      Result Value   BUN 24 (*)    Creatinine, Ser 1.29 (*)    Calcium 10.5 (*)    GFR, Estimated 41 (*)    All other components within normal limits  CBC WITH DIFFERENTIAL/PLATELET - Abnormal; Notable for the following components:   RBC 5.30 (*)    RDW 18.4 (*)    All other components within normal limits  URINALYSIS, ROUTINE W REFLEX MICROSCOPIC - Abnormal; Notable for the following components:   Hgb urine dipstick MODERATE (*)    Protein, ur 30 (*)    Leukocytes,Ua MODERATE (*)    Bacteria, UA FEW (*)    All other components within normal limits    EKG None  Radiology No results found.  Procedures Procedures    Medications Ordered in ED Medications - No data to display  ED Course/ Medical Decision Making/ A&P                           Medical Decision Making Amount and/or Complexity of Data Reviewed Labs: ordered.   This patient presents to the ED for concern of back pain, this involves an extensive number of treatment options, and is a complaint that carries with it a high risk of complications and morbidity.  The differential diagnosis includes degenerative disc disease, sciatica, fracture, dislocation, and others   Co morbidities that complicate the patient evaluation  History of chronic kidney disease, osteopenia   Additional history obtained:  Additional history obtained from family at bedside External records from outside source obtained and reviewed including telehealth notes from October 29 for bilateral low back pain.  Patient was prescribed diclofenac to be taken twice daily with food.  Patient also reported saw primary care who told her to take Tylenol.   Lab Tests:  I Ordered, and  personally interpreted labs.  The pertinent results include: Creatinine 1.29, improved from previous, urinalysis with moderate leukocytes, moderate hemoglobin, few bacteria; unremarkable CBC   Imaging Studies ordered:  The patient has no red flag symptoms to suggest the need for CT of the lumbar spine at this time.  She denies saddle anesthesia, urinary incontinence, fecal incontinence, urinary retention.  She also denies recent traumas.  Her symptoms are  consistent with low back etiologies and I see no indication at this time for abdominal imaging  Social Determinants of Health:  Patient reportedly lives alone   Test / Admission - Considered:  There is no indication at this time for admission.  The patient's symptoms are consistent with lumbar pain.  I agree with previous providers recommendations to use over-the-counter medications at this time.  She may take Tylenol as needed.  She was previously prescribed diclofenac but has kidney disease.  I recommend using ice and heat over the area as needed and recommend trying Voltaren gel over the area.  Patient to follow-up with her primary care physician        Final Clinical Impression(s) / ED Diagnoses Final diagnoses:  Acute left-sided low back pain without sciatica    Rx / DC Orders ED Discharge Orders     None         Ronny Bacon 11/06/22 Vaughn, Sonoita, MD 11/07/22 [phone]

## 2022-11-06 NOTE — ED Triage Notes (Signed)
Pt c/o low back pain x 1 week. Denies injury. Was seen by PCP and was told to use OTC meds. Pain persists.

## 2022-11-06 NOTE — Discharge Instructions (Addendum)
You were seen today for low back pain of the left side.  Please continue to take Tylenol.  You may take up to 1000 mg of Tylenol every 6 hours.  Do not exceed this amount.  You may also use Voltaren Gel over the area.  Please follow-up with your primary care physician for further evaluation and management.

## 2022-11-06 NOTE — ED Notes (Signed)
Provided pt with warm blanket, a snack and water.

## 2022-11-10 NOTE — Therapy (Signed)
OUTPATIENT PHYSICAL THERAPY THORACOLUMBAR EVALUATION   Patient Name: Alejandra Davis MRN: [id number] DOB:1936/12/14, 86 y.o., female Today's Date: 11/11/2022   PT End of Session - 11/11/22 1319     Visit Number 1    Date for PT Re-Evaluation 01/06/23    Authorization Type [employer] - requesting 8 visits through 01/06/23    Progress Note Due on Visit 10    PT Start Time 1100    PT Stop Time 1145    PT Time Calculation (min) 45 min    Activity Tolerance Patient tolerated treatment well    Behavior During Therapy WFL for tasks assessed/performed             Past Medical History:  Diagnosis Date   Chicken pox    Cystocele    Diabetes mellitus without complication ([place])    Glaucoma    Hypertension    Hypertensive retinopathy    OU   Neuromuscular disorder ([place])    Dementia    Thyroid disease    hypothyroidism   Past Surgical History:  Procedure Laterality Date   ABDOMINAL HYSTERECTOMY  1990   TAH BSO   CATARACT EXTRACTION Bilateral    Dr. [name]   COLONOSCOPY WITH PROPOFOL N/A 04/08/2021   Procedure: COLONOSCOPY WITH PROPOFOL;  Surgeon: [name], MD;  Location: [facility] END[facility]COPY;  Service: Endoscopy;  Laterality: N/A;   EYE SURGERY Bilateral    Cat Sx OU   OOPHORECTOMY     BSO   Vaginal Bx     Papilloma   Patient Active Problem List   Diagnosis Date Noted   Seborrheic dermatitis 08/27/2022   Rectal bleeding 04/10/2021   Chronic kidney disease, stage 3a ([place]) 04/06/2021   Late onset Alzheimer's dementia without behavioral disturbance ([place]) 04/06/2021   Sinus tachycardia 04/06/2021   Lower GI bleed 04/06/2021   Acute lower GI bleeding 04/05/2021   Stage 3b chronic kidney disease ([place]) 09/17/2020   Hyperlipidemia associated with type 2 diabetes mellitus ([place]) 09/17/2020   Flexural atopic dermatitis 05/30/2019   Presbycusis of both ears 05/11/2019   Acquired hypothyroidism 03/13/2019   Murmur 09/01/2018   Telogen effluvium 06/07/2018   Eczema of scalp  03/31/2018   Xerosis cutis 02/25/2018   Immunization reaction 02/08/2018   Keratosis pilaris 02/08/2018   Health care maintenance 02/07/2018   Controlled type 2 diabetes mellitus without complication, without long-term current use of insulin ([place]) 02/07/2018   Cystocele    Osteopenia    Essential hypertension       REFERRING PROVIDER: Faustino Congress, NP  REFERRING DIAG: M54.50 (ICD-10-CM) - Low back pain, unspecified  Rationale for Evaluation and Treatment: Rehabilitation  THERAPY DIAG:  Other low back pain  Muscle weakness (generalized)  ONSET DATE: approx 2 weeks ago, no known injury  SUBJECTIVE:  SUBJECTIVE STATEMENT: Pt with onset of LBP approx 2 weeks ago without known injury or change in activity.  Pain is in central lumbar area and spreads bil.  "I have to be very careful with movement - it is aggravating."  I am not taking anything for the pain.  I am not very active.  I sit in my kitchen chair or my couch at home. I used to walk at the park and [facility] with a friend but don't do it anymore.   PERTINENT HISTORY:  Past medical history significant for stage IIIb chronic kidney disease, hypertension, type II DM, Alzheimer's dementia, significant pelvic floor laxity  PAIN:  PAIN:  Are you having pain? Yes NPRS scale: 2/10, can range from 0-6/10 Pain location: central lumbar spreading bil Pain orientation: Bilateral and Lower  PAIN TYPE: aching and sharp Pain description: sharp, dull, and aching  Aggravating factors: transfers, quick movements Relieving factors: sitting in supportive chair   PRECAUTIONS: None  WEIGHT BEARING RESTRICTIONS: No  FALLS:  Has patient fallen in last 6 months? No  LIVING ENVIRONMENT: Lives with: lives alone Lives in: House/apartment Stairs: Yes:  External: 5 steps; on right going up Has following equipment at home: Grab bars  OCCUPATION: retired  PLOF: Independent  PATIENT GOALS: get rid of pain, not very interested in too many exercises  NEXT MD VISIT:   OBJECTIVE:   DIAGNOSTIC FINDINGS:  CT pelvis: IMPRESSION: 1. Stable appearance of penetrating atherosclerotic ulceration arising from the 2 o'clock position of the distal descending thoracic aorta. Maximal aortic diameter at this location remains unchanged at 2.2 cm. 2. Scattered aortic atherosclerotic calcifications throughout. No aneurysm. 3. Severe pelvic floor laxity. 4. Multiple bladder diverticula again noted. 5. Additional ancillary findings as above without significant interval change.  PATIENT SURVEYS:  FOTO 70%, goal 78%  SCREENING FOR RED FLAGS: Bowel or bladder incontinence: No Spinal tumors: No Cauda equina syndrome: No Compression fracture: No Abdominal aneurysm: No  COGNITION: Overall cognitive status: Within functional limits for tasks assessed     SENSATION: WFL  MUSCLE LENGTH: Hamstrings: Right 55 deg; Left 60 deg Rt piriformis and gluteals limited 25% compared to Lt   POSTURE: rounded shoulders, forward head, decreased lumbar lordosis, and decreased thoracic kyphosis  PALPATION: Tightness in paraspinals and poor facet mobility present throughout lumbar spine Rt>Lt, non-tender  LUMBAR ROM:   AROM eval  Flexion Fingers to mid shin, mild pain on Rt  Extension 12  Right lateral flexion WNL with contralateral stretch   Left lateral flexion WNL with contralateral stretch  Right rotation 25% painfree  Left rotation 25% painfree   (Blank rows = not tested)  LOWER EXTREMITY ROM:     Passive  Right eval Left eval  Hip flexion 75%   Hip extension    Hip abduction 75%   Hip adduction    Hip internal rotation WNL   Hip external rotation 75%   Knee flexion    Knee extension    Ankle dorsiflexion    Ankle plantarflexion     Ankle inversion    Ankle eversion     (Blank rows = not tested)  LOWER EXTREMITY MMT:   Bil hip flexors, abductors and extensors 3+/5 Core 3+/5 Knees 4/5 bil    LUMBAR SPECIAL TESTS:  Straight leg raise test: Negative, Slump test: Negative, and FABER test: Positive on Rt  FUNCTIONAL TESTS:  5 times sit to stand: 22 sec with bil UE use and knee valgus Unable to perform standing high knee  march due to poor balance in SLS Functional squat: performs with bil knee valgus and modified depth due to weakness  GAIT: Distance walked: within clinic Assistive device utilized: None Level of assistance: Modified independence Comments: slow gait with small step length bil, lacks trunk rotation and arm swing bil  TODAY'S TREATMENT:                                                                                                                              DATE:  11/11/22 evaluation:  Initiated HEP Discussed benefits of return to walking for exercise   PATIENT EDUCATION:  Education details: Access Code: [email or web address] Person educated: Patient Education method: Consulting civil engineer, Media planner, Verbal cues, and Handouts Education comprehension: verbalized understanding and returned demonstration  HOME EXERCISE PROGRAM: Access Code: [email or web address] URL: [email or web address] Date: 11/11/2022 Prepared by: Venetia Night Kaiulani Sitton  Exercises - Supine Lower Trunk Rotation  - 2 x daily - 7 x weekly - 1 sets - 10 reps - Hooklying Single Knee to Chest Stretch  - 2 x daily - 7 x weekly - 1 sets - 10 reps - Supine Figure 4 Piriformis Stretch  - 2 x daily - 7 x weekly - 1 sets - 2 reps - 30 sec hold - Supine Bridge  - 2 x daily - 7 x weekly - 1 sets - 10 reps - Seated Hamstring Stretch  - 2 x daily - 7 x weekly - 1 sets - 2 reps - 30 sec hold  ASSESSMENT:  CLINICAL IMPRESSION: Patient is an 86 y.o. female who was seen today for physical therapy evaluation and treatment for LBP. Pain started insidiously  approx 2 weeks ago.  Pt admits she has become inactive, sitting in kitchen chair or on couch most of the day.  She has some memory challenges.  She lives alone.  Pt has weakness of trunk and core, stiffness contributing to trunk and hip ROM deficits, and functional weakness for bending and squatting.  She ambulates with short stride length bil, slow gait speed and lacks arm swing and trunk rotation.  She will benefit from skilled PT to address deficits and improve pain through increased targeted activity.  OBJECTIVE IMPAIRMENTS: Abnormal gait, decreased activity tolerance, decreased balance, decreased mobility, difficulty walking, decreased ROM, decreased strength, hypomobility, impaired flexibility, improper body mechanics, postural dysfunction, and pain.   ACTIVITY LIMITATIONS: carrying, lifting, bending, squatting, stairs, transfers, bed mobility, bathing, and locomotion level  PARTICIPATION LIMITATIONS: meal prep, cleaning, laundry, driving, shopping, community activity, and yard work  PERSONAL FACTORS: Age and 1 comorbidity: memory deficits  are also affecting patient's functional outcome.   REHAB POTENTIAL: Good  CLINICAL DECISION MAKING: Stable/uncomplicated  EVALUATION COMPLEXITY: Low   GOALS: Goals reviewed with patient? Yes  SHORT TERM GOALS: Target date: 12/09/22  Pt will be ind with initial HEP Baseline: Goal status: INITIAL  2.  Pt will return to short walks for exericse at least 3x/week. Baseline:  Goal status: INITIAL  3.  Pt will report reduced pain by at least 20% with transitional movements. Baseline:  Goal status: INITIAL    LONG TERM GOALS: Target date: 01/06/23  Pt will be compliant with advanced HEP Baseline:  Goal status: INITIAL  2.  Pt will achieve at least 4/5 bil hip strength for improved functional task performance and transfers. Baseline:  Goal status: INITIAL  3.  Pt will improve FOTO score to at least 78% Baseline: 70% Goal status:  INITIAL  4.  Pt will report at least 70% improved pain with transitional movements. Baseline:  Goal status: INITIAL  5.  Pt will improve hamstring length to at least 65 deg for improved bending Baseline:  Goal status: INITIAL    PLAN:  PT FREQUENCY: 1-2x/week  PT DURATION: 8 weeks  PLANNED INTERVENTIONS: Therapeutic exercises, Therapeutic activity, Neuromuscular re-education, Balance training, Gait training, Patient/Family education, Self Care, Joint mobilization, Stair training, Aquatic Therapy, Dry Needling, Electrical stimulation, Spinal mobilization, Cryotherapy, Moist heat, Taping, Traction, Ionotophoresis '4mg'$ /ml Dexamethasone, and Manual therapy.  PLAN FOR NEXT SESSION: NuSTep, review HEP, progress spine ROM and LE strength, lumbar stabilization, update HEP but keep short for compliance, encourage walking program  Megon Kalina, PT 11/11/22 1:20 PM

## 2022-11-11 ENCOUNTER — Ambulatory Visit: Payer: Medicare PPO | Attending: Family Medicine | Admitting: Physical Therapy

## 2022-11-11 ENCOUNTER — Encounter: Payer: Self-pay | Admitting: Physical Therapy

## 2022-11-11 ENCOUNTER — Other Ambulatory Visit: Payer: Self-pay

## 2022-11-11 DIAGNOSIS — M5459 Other low back pain: Secondary | ICD-10-CM | POA: Diagnosis not present

## 2022-11-11 DIAGNOSIS — M6281 Muscle weakness (generalized): Secondary | ICD-10-CM | POA: Insufficient documentation

## 2022-11-23 ENCOUNTER — Encounter: Payer: Self-pay | Admitting: Podiatry

## 2022-11-23 ENCOUNTER — Ambulatory Visit: Payer: Medicare PPO | Admitting: Podiatry

## 2022-11-23 DIAGNOSIS — L03111 Cellulitis of right axilla: Secondary | ICD-10-CM | POA: Diagnosis not present

## 2022-11-23 DIAGNOSIS — L02619 Cutaneous abscess of unspecified foot: Secondary | ICD-10-CM

## 2022-11-23 DIAGNOSIS — L02611 Cutaneous abscess of right foot: Secondary | ICD-10-CM | POA: Diagnosis not present

## 2022-11-24 NOTE — Progress Notes (Signed)
Subjective:   Patient ID: Alejandra Davis, female   DOB: 86 y.o.   MRN: [id number]   HPI Patient presents with a reoccurrence of a blister on the right lateral foot that was gone for the last year and was treated a year ago by's   ROS      Objective:  Physical Exam  Neurovascular status intact with patient found to have a large blister on the right lateral ankle localized with no erythema edema or drainage surrounding it     Assessment:  Large abscess of the right lateral foot and ankle with no apparent organic cause or injury     Plan:  H&P condition reviewed at great length and today utilizing 10 cc syringe I did aspirate this and got out approximate 20 cc of fluid.  It was not purulent and appears to be sterile and I did discuss the abscess I then applied sterile dressing with compression and advised on keeping it compressed over the next couple weeks.  I discussed if any erythema edema drainage odor or other pathology were to occur to let us know immediately and may need to go to the emergency room

## 2022-11-25 ENCOUNTER — Ambulatory Visit: Payer: Medicare PPO | Admitting: Podiatry

## 2022-11-26 ENCOUNTER — Ambulatory Visit: Payer: Medicare PPO | Admitting: Physical Therapy

## 2022-11-26 DIAGNOSIS — M6281 Muscle weakness (generalized): Secondary | ICD-10-CM | POA: Diagnosis not present

## 2022-11-26 DIAGNOSIS — M5459 Other low back pain: Secondary | ICD-10-CM

## 2022-11-26 NOTE — Therapy (Signed)
OUTPATIENT PHYSICAL THERAPY THORACOLUMBAR PROGRESS NOTE   Patient Name: Alejandra Davis MRN: [id number] DOB:[birth year], 86 y.o., female Today's Date: 11/26/2022   PT End of Session - 11/26/22 1142     Visit Number 2    Number of Visits 8    Date for PT Re-Evaluation 01/06/23    Authorization Type [employer] -  8 visits through 01/06/23    Progress Note Due on Visit 10    PT Start Time 4098    PT Stop Time 1225    PT Time Calculation (min) 40 min    Activity Tolerance Patient tolerated treatment well             Past Medical History:  Diagnosis Date   Chicken pox    Cystocele    Diabetes mellitus without complication ([place])    Glaucoma    Hypertension    Hypertensive retinopathy    OU   Neuromuscular disorder ([place])    Dementia    Thyroid disease    hypothyroidism   Past Surgical History:  Procedure Laterality Date   ABDOMINAL HYSTERECTOMY  1990   TAH BSO   CATARACT EXTRACTION Bilateral    Dr. [name]   COLONOSCOPY WITH PROPOFOL N/A 04/08/2021   Procedure: COLONOSCOPY WITH PROPOFOL;  Surgeon: [name], MD;  Location: [facility];  Service: Endoscopy;  Laterality: N/A;   EYE SURGERY Bilateral    Cat Sx OU   OOPHORECTOMY     BSO   Vaginal Bx     Papilloma   Patient Active Problem List   Diagnosis Date Noted   Seborrheic dermatitis 08/27/2022   Rectal bleeding 04/10/2021   Chronic kidney disease, stage 3a ([place]) 04/06/2021   Late onset Alzheimer's dementia without behavioral disturbance ([place]) 04/06/2021   Sinus tachycardia 04/06/2021   Lower GI bleed 04/06/2021   Acute lower GI bleeding 04/05/2021   Stage 3b chronic kidney disease ([place]) 09/17/2020   Hyperlipidemia associated with type 2 diabetes mellitus ([place]) 09/17/2020   Flexural atopic dermatitis 05/30/2019   Presbycusis of both ears 05/11/2019   Acquired hypothyroidism 03/13/2019   Murmur 09/01/2018   Telogen effluvium 06/07/2018   Eczema of scalp 03/31/2018   Xerosis cutis 02/25/2018    Immunization reaction 02/08/2018   Keratosis pilaris 02/08/2018   Health care maintenance 02/07/2018   Controlled type 2 diabetes mellitus without complication, without long-term current use of insulin ([place]) 02/07/2018   Cystocele    Osteopenia    Essential hypertension       REFERRING PROVIDER: Faustino Congress, NP  REFERRING DIAG: M54.50 (ICD-10-CM) - Low back pain, unspecified  Rationale for Evaluation and Treatment: Rehabilitation  THERAPY DIAG:  Other low back pain  Muscle weakness (generalized)  ONSET DATE: approx 2 weeks ago, no known injury  SUBJECTIVE:  SUBJECTIVE STATEMENT: Reports minimal pain lately.  No questions following initial evaluation.    PERTINENT HISTORY:  Past medical history significant for stage IIIb chronic kidney disease, hypertension, type II DM, Alzheimer's dementia, significant pelvic floor laxity  PAIN:  PAIN:  Are you having pain? Yes NPRS scale: 2/10, Pain location: central lumbar spreading bil Pain orientation: Bilateral and Lower  PAIN TYPE: aching and sharp Pain description: sharp, dull, and aching  Aggravating factors: transfers, quick movements Relieving factors: sitting in supportive chair   PRECAUTIONS: None  WEIGHT BEARING RESTRICTIONS: No  FALLS:  Has patient fallen in last 6 months? No  LIVING ENVIRONMENT: Lives with: lives alone Lives in: House/apartment Stairs: Yes: External: 5 steps; on right going up Has following equipment at home: Grab bars  OCCUPATION: retired  PLOF: Independent  PATIENT GOALS: get rid of pain, not very interested in too many exercises  NEXT MD VISIT:   OBJECTIVE:   DIAGNOSTIC FINDINGS:  CT pelvis: IMPRESSION: 1. Stable appearance of penetrating atherosclerotic ulceration arising from the 2  o'clock position of the distal descending thoracic aorta. Maximal aortic diameter at this location remains unchanged at 2.2 cm. 2. Scattered aortic atherosclerotic calcifications throughout. No aneurysm. 3. Severe pelvic floor laxity. 4. Multiple bladder diverticula again noted. 5. Additional ancillary findings as above without significant interval change.  PATIENT SURVEYS:  FOTO 70%, goal 78%  SCREENING FOR RED FLAGS: Bowel or bladder incontinence: No Spinal tumors: No Cauda equina syndrome: No Compression fracture: No Abdominal aneurysm: No  COGNITION: Overall cognitive status: Within functional limits for tasks assessed     SENSATION: WFL  MUSCLE LENGTH: Hamstrings: Right 55 deg; Left 60 deg Rt piriformis and gluteals limited 25% compared to Lt   POSTURE: rounded shoulders, forward head, decreased lumbar lordosis, and decreased thoracic kyphosis  PALPATION: Tightness in paraspinals and poor facet mobility present throughout lumbar spine Rt>Lt, non-tender  LUMBAR ROM:   AROM eval  Flexion Fingers to mid shin, mild pain on Rt  Extension 12  Right lateral flexion WNL with contralateral stretch   Left lateral flexion WNL with contralateral stretch  Right rotation 25% painfree  Left rotation 25% painfree   (Blank rows = not tested)  LOWER EXTREMITY ROM:     Passive  Right eval Left eval  Hip flexion 75%   Hip extension    Hip abduction 75%   Hip adduction    Hip internal rotation WNL   Hip external rotation 75%   Knee flexion    Knee extension    Ankle dorsiflexion    Ankle plantarflexion    Ankle inversion    Ankle eversion     (Blank rows = not tested)  LOWER EXTREMITY MMT:   Bil hip flexors, abductors and extensors 3+/5 Core 3+/5 Knees 4/5 bil    LUMBAR SPECIAL TESTS:  Straight leg raise test: Negative, Slump test: Negative, and FABER test: Positive on Rt  FUNCTIONAL TESTS:  5 times sit to stand: 22 sec with bil UE use and knee  valgus Unable to perform standing high knee march due to poor balance in SLS Functional squat: performs with bil knee valgus and modified depth due to weakness  GAIT: Distance walked: within clinic Assistive device utilized: None Level of assistance: Modified independence Comments: slow gait with small step length bil, lacks trunk rotation and arm swing bil  TODAY'S TREATMENT:  DATE:  11/30:  Seated HS stretch on foot stool 2x 30 sec hold Supine lumbar rotation 5x each side  SKTC 2 x 30 sec hold Supine figure 4 stretch 2x 30 sec Supine bridge 10x Supine clam green band 10x Seated red plyo ball 2# chops 10x right/left Seated red band rows 10x Seated red band shoulder extension 10x Nu-Step L3 (new model) 5 min      11/11/22 evaluation:  Initiated HEP Discussed benefits of return to walking for exercise   PATIENT EDUCATION:  Education details: Access Code: [email or web address] Person educated: Patient Education method: Consulting civil engineer, Media planner, Verbal cues, and Handouts Education comprehension: verbalized understanding and returned demonstration  HOME EXERCISE PROGRAM: Access Code: [email or web address] URL: [email or web address] Date: 11/26/2022 Prepared by: Ruben Im  Exercises - Supine Lower Trunk Rotation  - 2 x daily - 7 x weekly - 1 sets - 10 reps - Hooklying Single Knee to Chest Stretch  - 2 x daily - 7 x weekly - 1 sets - 10 reps - Supine Figure 4 Piriformis Stretch  - 2 x daily - 7 x weekly - 1 sets - 2 reps - 30 sec hold - Supine Bridge  - 2 x daily - 7 x weekly - 1 sets - 10 reps - Seated Hamstring Stretch  - 2 x daily - 7 x weekly - 1 sets - 2 reps - 30 sec hold - Sit to Stand  - 1 x daily - 7 x weekly - 2 sets - 5 reps  ASSESSMENT:  CLINICAL IMPRESSION: Max cues with replicating initial HEP.  Patient given new copy of ex's as she is  unsure where the pictures are at home with only 1 new addition  (sit to stand) to help with compliance. Therapist monitoring response to all interventions and modifying treatment accordingly.    Exercises not painful for her back.     OBJECTIVE IMPAIRMENTS: Abnormal gait, decreased activity tolerance, decreased balance, decreased mobility, difficulty walking, decreased ROM, decreased strength, hypomobility, impaired flexibility, improper body mechanics, postural dysfunction, and pain.   ACTIVITY LIMITATIONS: carrying, lifting, bending, squatting, stairs, transfers, bed mobility, bathing, and locomotion level  PARTICIPATION LIMITATIONS: meal prep, cleaning, laundry, driving, shopping, community activity, and yard work  PERSONAL FACTORS: Age and 1 comorbidity: memory deficits  are also affecting patient's functional outcome.   REHAB POTENTIAL: Good  CLINICAL DECISION MAKING: Stable/uncomplicated  EVALUATION COMPLEXITY: Low   GOALS: Goals reviewed with patient? Yes  SHORT TERM GOALS: Target date: 12/09/22  Pt will be ind with initial HEP Baseline: Goal status: INITIAL  2.  Pt will return to short walks for exericse at least 3x/week. Baseline:  Goal status: INITIAL  3.  Pt will report reduced pain by at least 20% with transitional movements. Baseline:  Goal status: INITIAL    LONG TERM GOALS: Target date: 01/06/23  Pt will be compliant with advanced HEP Baseline:  Goal status: INITIAL  2.  Pt will achieve at least 4/5 bil hip strength for improved functional task performance and transfers. Baseline:  Goal status: INITIAL  3.  Pt will improve FOTO score to at least 78% Baseline: 70% Goal status: INITIAL  4.  Pt will report at least 70% improved pain with transitional movements. Baseline:  Goal status: INITIAL  5.  Pt will improve hamstring length to at least 65 deg for improved bending Baseline:  Goal status: INITIAL    PLAN:  PT FREQUENCY: 1-2x/week  PT  DURATION: 8 weeks  PLANNED INTERVENTIONS: Therapeutic exercises, Therapeutic activity, Neuromuscular  re-education, Balance training, Gait training, Patient/Family education, Self Care, Joint mobilization, Stair training, Aquatic Therapy, Dry Needling, Electrical stimulation, Spinal mobilization, Cryotherapy, Moist heat, Taping, Traction, Ionotophoresis '4mg'$ /ml Dexamethasone, and Manual therapy.  PLAN FOR NEXT SESSION: NuStep, review HEP, progress spine ROM and LE strength, lumbar stabilization, update HEP but keep short for compliance, encourage walking program  Ruben Im, PT 11/26/22 3:56 PM Phone: [phone] Fax: [phone]

## 2022-12-01 ENCOUNTER — Other Ambulatory Visit: Payer: Self-pay | Admitting: Family

## 2022-12-01 ENCOUNTER — Encounter: Payer: Self-pay | Admitting: Family Medicine

## 2022-12-01 DIAGNOSIS — M545 Low back pain, unspecified: Secondary | ICD-10-CM

## 2022-12-01 MED ORDER — MEMANTINE HCL ER 7 MG PO CP24: 7.0000 mg | ORAL_CAPSULE | Freq: Every day | ORAL | 3 refills | Status: DC

## 2022-12-01 NOTE — Progress Notes (Shared)
[facility] Note  12/07/2022    CHIEF COMPLAINT Patient presents for No chief complaint on file.  HISTORY OF PRESENT ILLNESS: Alejandra Davis is a 86 y.o. female who presents to the clinic today for:    Patient feels that the vision is the same.   Referring physician: Faustino Congress, NP [place],  [place] [address]  HISTORICAL INFORMATION:   Selected notes from the MEDICAL RECORD NUMBER Referred by [name] for DM exam   CURRENT MEDICATIONS: Current Outpatient Medications (Ophthalmic Drugs)  Medication Sig   dorzolamide-timolol (COSOPT) 22.3-6.8 MG/ML ophthalmic solution Place 1 drop into both eyes 2 (two) times daily.   No current facility-administered medications for this visit. (Ophthalmic Drugs)   Current Outpatient Medications (Other)  Medication Sig   amLODipine (NORVASC) 10 MG tablet TAKE 1 TABLET(10 MG) BY MOUTH DAILY   ASPIRIN LOW DOSE 81 MG EC tablet Take 81 mg by mouth daily.   diclofenac (VOLTAREN) 75 MG EC tablet Take 1 tablet (75 mg total) by mouth 2 (two) times daily.   levothyroxine (SYNTHROID) 75 MCG tablet Take 1 tablet (75 mcg total) by mouth daily.   lisinopril (ZESTRIL) 2.5 MG tablet Take 1 tablet (2.5 mg total) by mouth daily.   loratadine (CLARITIN) 10 MG tablet TAKE 1 TABLET(10 MG) BY MOUTH DAILY   memantine (NAMENDA XR) 7 MG CP24 24 hr capsule Take 1 capsule (7 mg total) by mouth daily.   metoprolol tartrate (LOPRESSOR) 50 MG tablet TAKE 1 TABLET(50 MG) BY MOUTH DAILY   No current facility-administered medications for this visit. (Other)   REVIEW OF SYSTEMS:   ALLERGIES Allergies  Allergen Reactions   Donepezil Hcl     Stomach cramps    PAST MEDICAL HISTORY Past Medical History:  Diagnosis Date   Chicken pox    Cystocele    Diabetes mellitus without complication ([facility])    Glaucoma    Hypertension    Hypertensive retinopathy    OU   Neuromuscular disorder ([place])    Dementia     Thyroid disease    hypothyroidism   Past Surgical History:  Procedure Laterality Date   ABDOMINAL HYSTERECTOMY  1990   TAH BSO   CATARACT EXTRACTION Bilateral    Dr. [name]   COLONOSCOPY WITH PROPOFOL N/A 04/08/2021   Procedure: COLONOSCOPY WITH PROPOFOL;  Surgeon: [name], MD;  Location: [facility];  Service: Endoscopy;  Laterality: N/A;   EYE SURGERY Bilateral    Cat Sx OU   OOPHORECTOMY     BSO   Vaginal Bx     Papilloma   FAMILY HISTORY Family History  Problem Relation Age of Onset   Sickle cell anemia Other    Hypertension Mother    Cancer Father        LIVER   Heart disease Brother    Cancer Brother        STOMACH   SOCIAL HISTORY Social History   Tobacco Use   Smoking status: Never   Smokeless tobacco: Never  Vaping Use   Vaping Use: Never used  Substance Use Topics   Alcohol use: No   Drug use: No       OPHTHALMIC EXAM: Not recorded    IMAGING AND PROCEDURES  Imaging and Procedures for 03/07/18          ASSESSMENT/PLAN:   ICD-10-CM   1. Branch retinal vein occlusion of right eye with macular edema  P59.1638  2. Diabetes mellitus type 2 without retinopathy ([place])  E11.9     3. Epiretinal membrane (ERM) of both eyes  H35.373     4. Pseudophakia of both eyes  Z96.1     5. Ocular hypertension of right eye  H40.051     6. Glaucoma suspect of both eyes  H40.003     7. Essential hypertension  I10     8. Hypertensive retinopathy of both eyes  H35.033       1. BRVO with CME OD  - lost to follow up from 02.22.22 to 11.1.22 -- re-presented with massive CME and BCVA 20/400 from 20/30  - initial OCT w/ CME superior macula  - s/p IVA OD #1 (10.18.19), #2 (11.19.19), #3 (12.17.19), #4 (02.19.20), #5 (03.23.20), #6 (04.21.20), #7 (05.27.20), #8 (07.07.20), #9 (08.25.20), #10 (9.29.20), #11 (11.17.20), #12 (12.22.20), #13 (01.27.21), #14 (03.10.21), #15 (04.21.21), #16 (05.26.21), #17 (06.30.21), #18 (08.04.21), #19 (09.01.21), #20  (10.06.21), #21 (11.17.21), #22 (12.30.21), #23 (2.22.22), #24 (11.1.22), #25 (11.29.22), #26 (01.03.23), #27 (02.14.23), #28 (04.07.23), #29 (05.19.23), #30 (06.20.23) -- IVA resistance  **history of worsening IRF/cystic changes at 6+ weeks interval, IVA**  - s/p IVE OD #1 (08.11.23), #2 (09.08.23), #3 (10.06.23), #4 (11.06.23)  - BCVA OD stable at 20/100  - OCT shows Interval improvement in IRF/cystic changes, central SRHM and patchy ORA; diffuse retinal thinning--stable  - recommend IVE OD #5 today, 12.11.23, ext f/u to 5wks.   - RBA of procedure discussed, questions answered - informed consent obtained and signed - see procedure note  - [place] informed consent form re-signed and scanned on 05.19.23  - [place] informed consent form signed and scanned on 08.11.23  - f/u ext to 5 weeks -- DFE/OCT, possible injection   2. Diabetes mellitus, type 2 without retinopathy  - The incidence, risk factors for progression, natural history and treatment options for diabetic retinopathy  were discussed with patient.    - The need for close monitoring of blood glucose, blood pressure, and serum lipids, avoiding cigarette or any type of tobacco, and the need for long term follow up was also discussed with patient.    3. Epiretinal membrane, OU  - relatively mild ERM OU -- blunted central foveal depression  - OD with mild cystic changes 2/2 BRVO as above; OS without cystic changes or edema  - discussed findings and prognosis   - continue to monitor  4. Pseudophakia OU  - s/p CE/IOL OS 11.13.19 w/ Dr. [name]  - s/p CE/IOL OD 01.16.20 w/ Dr. [name]  - beautiful surgeries -- IOLs in perfect position  - healing well post-operatively  - IRF/CME OD may have been partly due to post op CME ([place]) in addition to BRVO  5,6. Ocular hypertension / Glaucoma suspect OD>OS  - IOP ~25 OD, 20 OS at prior clinic visits  - today IOP 7,9   - denies any family hx of Glaucoma   - on cosopt and brimonidine bid OU  per Dr. Lucianne Lei  7,8. Hypertensive retinopathy OU  - pt presented to 6.24.2020 visit urgently for right sided headache above right eye with extension to occiput  - BP at that time was 201/100 -- pt was sent to primary care clinic for urgent evaluation and meds were adjusted  - BP now under better control and headaches improved  - BP reading 11.1.22 -- 180/84 -- advised f/u with PCP  - discussed importance of high BP control  Ophthalmic Meds Ordered this visit:  No orders of  the defined types were placed in this encounter.    No follow-ups on file.  There are no Patient Instructions on file for this visit.  This document serves as a record of services personally performed by Gardiner Sleeper, MD, PhD. It was created on their behalf by Roselee Nova, COMT. The creation of this record is the provider's dictation and/or activities during the visit.  Electronically signed by: Roselee Nova, COMT 12/01/22 2:59 PM     Gardiner Sleeper, M.D., Ph.D. Diseases & Surgery of the Retina and Vitreous [facility]: M myopia (nearsighted); A astigmatism; H hyperopia (farsighted); P presbyopia; Mrx spectacle prescription;  CTL contact lenses; OD right eye; OS left eye; OU both eyes  XT exotropia; ET esotropia; PEK punctate epithelial keratitis; PEE punctate epithelial erosions; DES dry eye syndrome; MGD meibomian gland dysfunction; ATs artificial tears; PFAT's preservative free artificial tears; [place] nuclear sclerotic cataract; PSC posterior subcapsular cataract; ERM epi-retinal membrane; PVD posterior vitreous detachment; RD retinal detachment; DM diabetes mellitus; DR diabetic retinopathy; NPDR non-proliferative diabetic retinopathy; PDR proliferative diabetic retinopathy; CSME clinically significant macular edema; DME diabetic macular edema; dbh dot blot hemorrhages; CWS cotton wool spot; POAG primary open angle glaucoma; C/D cup-to-disc ratio; HVF humphrey visual field;  GVF goldmann visual field; OCT optical coherence tomography; IOP intraocular pressure; BRVO Branch retinal vein occlusion; CRVO central retinal vein occlusion; CRAO central retinal artery occlusion; BRAO branch retinal artery occlusion; RT retinal tear; SB scleral buckle; PPV pars plana vitrectomy; VH Vitreous hemorrhage; PRP panretinal laser photocoagulation; IVK intravitreal kenalog; VMT vitreomacular traction; MH Macular hole;  NVD neovascularization of the disc; NVE neovascularization elsewhere; AREDS age related eye disease study; ARMD age related macular degeneration; POAG primary open angle glaucoma; EBMD epithelial/anterior basement membrane dystrophy; ACIOL anterior chamber intraocular lens; IOL intraocular lens; PCIOL posterior chamber intraocular lens; Phaco/IOL phacoemulsification with intraocular lens placement; [place] photorefractive keratectomy; LASIK laser assisted in situ keratomileusis; HTN hypertension; DM diabetes mellitus; COPD chronic obstructive pulmonary disease

## 2022-12-03 ENCOUNTER — Encounter: Payer: Self-pay | Admitting: Physical Therapy

## 2022-12-03 ENCOUNTER — Ambulatory Visit: Payer: Medicare PPO | Attending: Family Medicine | Admitting: Physical Therapy

## 2022-12-03 DIAGNOSIS — M6281 Muscle weakness (generalized): Secondary | ICD-10-CM | POA: Insufficient documentation

## 2022-12-03 DIAGNOSIS — M5459 Other low back pain: Secondary | ICD-10-CM | POA: Diagnosis not present

## 2022-12-03 NOTE — Therapy (Signed)
OUTPATIENT PHYSICAL THERAPY THORACOLUMBAR PROGRESS NOTE   Patient Name: Alejandra Davis MRN: [id number] DOB:[birth year], 86 y.o., female Today's Date: 12/03/2022   PT End of Session - 12/03/22 1101     Visit Number 3    Number of Visits 8    Date for PT Re-Evaluation 01/06/23    Authorization Type [employer] -  8 visits through 01/06/23    Progress Note Due on Visit 10    PT Start Time 1101    PT Stop Time 1143    PT Time Calculation (min) 42 min    Activity Tolerance Patient tolerated treatment well    Behavior During Therapy WFL for tasks assessed/performed              Past Medical History:  Diagnosis Date   Chicken pox    Cystocele    Diabetes mellitus without complication ([place])    Glaucoma    Hypertension    Hypertensive retinopathy    OU   Neuromuscular disorder ([place])    Dementia    Thyroid disease    hypothyroidism   Past Surgical History:  Procedure Laterality Date   ABDOMINAL HYSTERECTOMY  1990   TAH BSO   CATARACT EXTRACTION Bilateral    Dr. [name]   COLONOSCOPY WITH PROPOFOL N/A 04/08/2021   Procedure: COLONOSCOPY WITH PROPOFOL;  Surgeon: [name], MD;  Location: [facility];  Service: Endoscopy;  Laterality: N/A;   EYE SURGERY Bilateral    Cat Sx OU   OOPHORECTOMY     BSO   Vaginal Bx     Papilloma   Patient Active Problem List   Diagnosis Date Noted   Seborrheic dermatitis 08/27/2022   Rectal bleeding 04/10/2021   Chronic kidney disease, stage 3a ([place]) 04/06/2021   Late onset Alzheimer's dementia without behavioral disturbance ([place]) 04/06/2021   Sinus tachycardia 04/06/2021   Lower GI bleed 04/06/2021   Acute lower GI bleeding 04/05/2021   Stage 3b chronic kidney disease ([place]) 09/17/2020   Hyperlipidemia associated with type 2 diabetes mellitus ([place]) 09/17/2020   Flexural atopic dermatitis 05/30/2019   Presbycusis of both ears 05/11/2019   Acquired hypothyroidism 03/13/2019   Murmur 09/01/2018   Telogen effluvium 06/07/2018    Eczema of scalp 03/31/2018   Xerosis cutis 02/25/2018   Immunization reaction 02/08/2018   Keratosis pilaris 02/08/2018   Health care maintenance 02/07/2018   Controlled type 2 diabetes mellitus without complication, without long-term current use of insulin ([place]) 02/07/2018   Cystocele    Osteopenia    Essential hypertension       REFERRING PROVIDER: Faustino Congress, NP  REFERRING DIAG: M54.50 (ICD-10-CM) - Low back pain, unspecified  Rationale for Evaluation and Treatment: Rehabilitation  THERAPY DIAG:  Other low back pain  Muscle weakness (generalized)  ONSET DATE: approx 2 weeks ago, no known injury  SUBJECTIVE:  SUBJECTIVE STATEMENT: I did fine after last time.  I wasn't too tired. No pain today.   PERTINENT HISTORY:  Past medical history significant for stage IIIb chronic kidney disease, hypertension, type II DM, Alzheimer's dementia, significant pelvic floor laxity  PAIN:  PAIN:  Are you having pain? Yes NPRS scale: 2/10, Pain location: central lumbar spreading bil Pain orientation: Bilateral and Lower  PAIN TYPE: aching and sharp Pain description: sharp, dull, and aching  Aggravating factors: transfers, quick movements Relieving factors: sitting in supportive chair   PRECAUTIONS: None  WEIGHT BEARING RESTRICTIONS: No  FALLS:  Has patient fallen in last 6 months? No  LIVING ENVIRONMENT: Lives with: lives alone Lives in: House/apartment Stairs: Yes: External: 5 steps; on right going up Has following equipment at home: Grab bars  OCCUPATION: retired  PLOF: Independent  PATIENT GOALS: get rid of pain, not very interested in too many exercises  NEXT MD VISIT:   OBJECTIVE:   DIAGNOSTIC FINDINGS:  CT pelvis: IMPRESSION: 1. Stable appearance of penetrating  atherosclerotic ulceration arising from the 2 o'clock position of the distal descending thoracic aorta. Maximal aortic diameter at this location remains unchanged at 2.2 cm. 2. Scattered aortic atherosclerotic calcifications throughout. No aneurysm. 3. Severe pelvic floor laxity. 4. Multiple bladder diverticula again noted. 5. Additional ancillary findings as above without significant interval change.  PATIENT SURVEYS:  FOTO 70%, goal 78%  SCREENING FOR RED FLAGS: Bowel or bladder incontinence: No Spinal tumors: No Cauda equina syndrome: No Compression fracture: No Abdominal aneurysm: No  COGNITION: Overall cognitive status: Within functional limits for tasks assessed     SENSATION: WFL  MUSCLE LENGTH: Hamstrings: Right 55 deg; Left 60 deg Rt piriformis and gluteals limited 25% compared to Lt   POSTURE: rounded shoulders, forward head, decreased lumbar lordosis, and decreased thoracic kyphosis  PALPATION: Tightness in paraspinals and poor facet mobility present throughout lumbar spine Rt>Lt, non-tender  LUMBAR ROM:   AROM eval  Flexion Fingers to mid shin, mild pain on Rt  Extension 12  Right lateral flexion WNL with contralateral stretch   Left lateral flexion WNL with contralateral stretch  Right rotation 25% painfree  Left rotation 25% painfree   (Blank rows = not tested)  LOWER EXTREMITY ROM:     Passive  Right eval Left eval  Hip flexion 75%   Hip extension    Hip abduction 75%   Hip adduction    Hip internal rotation WNL   Hip external rotation 75%   Knee flexion    Knee extension    Ankle dorsiflexion    Ankle plantarflexion    Ankle inversion    Ankle eversion     (Blank rows = not tested)  LOWER EXTREMITY MMT:   Bil hip flexors, abductors and extensors 3+/5 Core 3+/5 Knees 4/5 bil    LUMBAR SPECIAL TESTS:  Straight leg raise test: Negative, Slump test: Negative, and FABER test: Positive on Rt  FUNCTIONAL TESTS:  5 times sit to  stand: 22 sec with bil UE use and knee valgus Unable to perform standing high knee march due to poor balance in SLS Functional squat: performs with bil knee valgus and modified depth due to weakness  GAIT: Distance walked: within clinic Assistive device utilized: None Level of assistance: Modified independence Comments: slow gait with small step length bil, lacks trunk rotation and arm swing bil  TODAY'S TREATMENT:  DATE:  12/7: Nu-Step L3 (new model) 7 min PT present to monitor Supine lumbar rotation 5x each side  SKTC 2 x 30 sec hold Supine figure 4 stretch 2x 30 sec Supine bridge 10x Supine clam green band 10x Combined bridge with green loop clam x10 Supine SLR 2x5 bil, VC for quad engagement to keep knee straight Seated red plyo ball 2# chops 10x right/left Red band horiz abd x10 Seated red band rows 10x Seated red band shoulder extension 10x Sit to stand with ball between knees arms crossed x 10 Standing at barre with bil UE support: 10x bil heel raise, 10x hip abd bil  11/30:  Seated HS stretch on foot stool 2x 30 sec hold Supine lumbar rotation 5x each side  SKTC 2 x 30 sec hold Supine figure 4 stretch 2x 30 sec Supine bridge 10x Supine clam green band 10x Seated red plyo ball 2# chops 10x right/left Seated red band rows 10x Seated red band shoulder extension 10x Nu-Step L3 (new model) 5 min      11/11/22 evaluation:  Initiated HEP Discussed benefits of return to walking for exercise   PATIENT EDUCATION:  Education details: Access Code: [email or web address] Person educated: Patient Education method: Consulting civil engineer, Media planner, Verbal cues, and Handouts Education comprehension: verbalized understanding and returned demonstration  HOME EXERCISE PROGRAM: Access Code: [email or web address] URL: [email or web address] Date: 11/26/2022 Prepared  by: Ruben Im  Exercises - Supine Lower Trunk Rotation  - 2 x daily - 7 x weekly - 1 sets - 10 reps - Hooklying Single Knee to Chest Stretch  - 2 x daily - 7 x weekly - 1 sets - 10 reps - Supine Figure 4 Piriformis Stretch  - 2 x daily - 7 x weekly - 1 sets - 2 reps - 30 sec hold - Supine Bridge  - 2 x daily - 7 x weekly - 1 sets - 10 reps - Seated Hamstring Stretch  - 2 x daily - 7 x weekly - 1 sets - 2 reps - 30 sec hold - Sit to Stand  - 1 x daily - 7 x weekly - 2 sets - 5 reps  ASSESSMENT:  CLINICAL IMPRESSION: Pt arrives denying any back pain.  She demos improved spine ROM and LE flexibility.  She was able to progress therex in all positions today without back pain.  She needed mod/max cueing for SLR to keep quad engaged to prevent knee flexion.  She is open to eventually giving her a few more exercises for strength which we discussed today.  Her memory will likely be a barrier so did not advance HEP yet as she is having trouble getting HEP done to date.     OBJECTIVE IMPAIRMENTS: Abnormal gait, decreased activity tolerance, decreased balance, decreased mobility, difficulty walking, decreased ROM, decreased strength, hypomobility, impaired flexibility, improper body mechanics, postural dysfunction, and pain.   ACTIVITY LIMITATIONS: carrying, lifting, bending, squatting, stairs, transfers, bed mobility, bathing, and locomotion level  PARTICIPATION LIMITATIONS: meal prep, cleaning, laundry, driving, shopping, community activity, and yard work  PERSONAL FACTORS: Age and 1 comorbidity: memory deficits  are also affecting patient's functional outcome.   REHAB POTENTIAL: Good  CLINICAL DECISION MAKING: Stable/uncomplicated  EVALUATION COMPLEXITY: Low   GOALS: Goals reviewed with patient? Yes  SHORT TERM GOALS: Target date: 12/09/22  Pt will be ind with initial HEP Baseline: Goal status: INITIAL  2.  Pt will return to short walks for exericse at least 3x/week. Baseline:   Goal status: INITIAL  3.  Pt  will report reduced pain by at least 20% with transitional movements. Baseline:  Goal status: INITIAL    LONG TERM GOALS: Target date: 01/06/23  Pt will be compliant with advanced HEP Baseline:  Goal status: INITIAL  2.  Pt will achieve at least 4/5 bil hip strength for improved functional task performance and transfers. Baseline:  Goal status: INITIAL  3.  Pt will improve FOTO score to at least 78% Baseline: 70% Goal status: INITIAL  4.  Pt will report at least 70% improved pain with transitional movements. Baseline:  Goal status: INITIAL  5.  Pt will improve hamstring length to at least 65 deg for improved bending Baseline:  Goal status: INITIAL    PLAN:  PT FREQUENCY: 1-2x/week  PT DURATION: 8 weeks  PLANNED INTERVENTIONS: Therapeutic exercises, Therapeutic activity, Neuromuscular re-education, Balance training, Gait training, Patient/Family education, Self Care, Joint mobilization, Stair training, Aquatic Therapy, Dry Needling, Electrical stimulation, Spinal mobilization, Cryotherapy, Moist heat, Taping, Traction, Ionotophoresis '4mg'$ /ml Dexamethasone, and Manual therapy.  PLAN FOR NEXT SESSION: NuStep, review HEP, progress spine ROM and LE strength, lumbar stabilization, update HEP but keep short for compliance, encourage walking program  Baruch Merl, PT 12/03/22 11:44 AM  Phone: [phone] Fax: [phone]

## 2022-12-07 ENCOUNTER — Encounter (INDEPENDENT_AMBULATORY_CARE_PROVIDER_SITE_OTHER): Payer: Medicare PPO | Admitting: Ophthalmology

## 2022-12-07 DIAGNOSIS — H34831 Tributary (branch) retinal vein occlusion, right eye, with macular edema: Secondary | ICD-10-CM

## 2022-12-07 DIAGNOSIS — Z961 Presence of intraocular lens: Secondary | ICD-10-CM

## 2022-12-07 DIAGNOSIS — H40051 Ocular hypertension, right eye: Secondary | ICD-10-CM

## 2022-12-07 DIAGNOSIS — H40003 Preglaucoma, unspecified, bilateral: Secondary | ICD-10-CM

## 2022-12-07 DIAGNOSIS — I10 Essential (primary) hypertension: Secondary | ICD-10-CM

## 2022-12-07 DIAGNOSIS — H35033 Hypertensive retinopathy, bilateral: Secondary | ICD-10-CM

## 2022-12-07 DIAGNOSIS — H35373 Puckering of macula, bilateral: Secondary | ICD-10-CM

## 2022-12-07 DIAGNOSIS — E119 Type 2 diabetes mellitus without complications: Secondary | ICD-10-CM

## 2022-12-08 ENCOUNTER — Encounter (INDEPENDENT_AMBULATORY_CARE_PROVIDER_SITE_OTHER): Payer: Medicare PPO | Admitting: Ophthalmology

## 2022-12-09 ENCOUNTER — Encounter (INDEPENDENT_AMBULATORY_CARE_PROVIDER_SITE_OTHER): Payer: Medicare PPO | Admitting: Ophthalmology

## 2022-12-09 ENCOUNTER — Ambulatory Visit (INDEPENDENT_AMBULATORY_CARE_PROVIDER_SITE_OTHER): Payer: Medicare PPO | Admitting: Ophthalmology

## 2022-12-09 ENCOUNTER — Encounter (INDEPENDENT_AMBULATORY_CARE_PROVIDER_SITE_OTHER): Payer: Self-pay | Admitting: Ophthalmology

## 2022-12-09 DIAGNOSIS — H35373 Puckering of macula, bilateral: Secondary | ICD-10-CM

## 2022-12-09 DIAGNOSIS — H35033 Hypertensive retinopathy, bilateral: Secondary | ICD-10-CM | POA: Diagnosis not present

## 2022-12-09 DIAGNOSIS — Z961 Presence of intraocular lens: Secondary | ICD-10-CM

## 2022-12-09 DIAGNOSIS — I10 Essential (primary) hypertension: Secondary | ICD-10-CM | POA: Diagnosis not present

## 2022-12-09 DIAGNOSIS — H34831 Tributary (branch) retinal vein occlusion, right eye, with macular edema: Secondary | ICD-10-CM

## 2022-12-09 DIAGNOSIS — H40051 Ocular hypertension, right eye: Secondary | ICD-10-CM

## 2022-12-09 DIAGNOSIS — E119 Type 2 diabetes mellitus without complications: Secondary | ICD-10-CM

## 2022-12-09 DIAGNOSIS — J1189 Influenza due to unidentified influenza virus with other manifestations: Secondary | ICD-10-CM | POA: Diagnosis not present

## 2022-12-09 DIAGNOSIS — H40003 Preglaucoma, unspecified, bilateral: Secondary | ICD-10-CM

## 2022-12-09 MED ORDER — AFLIBERCEPT 2MG/0.05ML IZ SOLN FOR KALEIDOSCOPE
2.0000 mg | INTRAVITREAL | Status: AC | PRN
  Administered 2022-12-09: 2 mg via INTRAVITREAL

## 2022-12-09 NOTE — Progress Notes (Signed)
[facility] Note  12/09/2022    CHIEF COMPLAINT Patient presents for Retina Follow Up  HISTORY OF PRESENT ILLNESS: Alejandra Davis is a 86 y.o. female who presents to the clinic today for:  HPI     Retina Follow Up   Patient presents with  CRVO/BRVO.  In right eye.  This started 5 weeks ago.  Severity is moderate.  Duration of 5 weeks.  Since onset it is stable.  I, the attending physician,  performed the HPI with the patient and updated documentation appropriately.        Comments   5 week BRVO OD pt states no vision changes noticed       Last edited by [name], MD on 12/09/2022  9:08 PM.     Patient feels that the vision is the same.   Referring physician: Faustino Congress, NP [place],  [place] [address]  HISTORICAL INFORMATION:   Selected notes from the MEDICAL RECORD NUMBER Referred by [name] for DM exam   CURRENT MEDICATIONS: Current Outpatient Medications (Ophthalmic Drugs)  Medication Sig   dorzolamide-timolol (COSOPT) 22.3-6.8 MG/ML ophthalmic solution Place 1 drop into both eyes 2 (two) times daily.   No current facility-administered medications for this visit. (Ophthalmic Drugs)   Current Outpatient Medications (Other)  Medication Sig   amLODipine (NORVASC) 10 MG tablet TAKE 1 TABLET(10 MG) BY MOUTH DAILY   ASPIRIN LOW DOSE 81 MG EC tablet Take 81 mg by mouth daily.   diclofenac (VOLTAREN) 75 MG EC tablet Take 1 tablet (75 mg total) by mouth 2 (two) times daily.   levothyroxine (SYNTHROID) 75 MCG tablet Take 1 tablet (75 mcg total) by mouth daily.   lisinopril (ZESTRIL) 2.5 MG tablet Take 1 tablet (2.5 mg total) by mouth daily.   loratadine (CLARITIN) 10 MG tablet TAKE 1 TABLET(10 MG) BY MOUTH DAILY   memantine (NAMENDA XR) 7 MG CP24 24 hr capsule Take 1 capsule (7 mg total) by mouth daily.   metoprolol tartrate (LOPRESSOR) 50 MG tablet TAKE 1 TABLET(50 MG) BY MOUTH DAILY   No current  facility-administered medications for this visit. (Other)   REVIEW OF SYSTEMS: ROS   Positive for: Eyes Last edited by [name], MD on 12/09/2022  9:07 PM.     ALLERGIES Allergies  Allergen Reactions   Donepezil Hcl     Stomach cramps    PAST MEDICAL HISTORY Past Medical History:  Diagnosis Date   Chicken pox    Cystocele    Diabetes mellitus without complication ([place])    Glaucoma    Hypertension    Hypertensive retinopathy    OU   Neuromuscular disorder ([place])    Dementia    Thyroid disease    hypothyroidism   Past Surgical History:  Procedure Laterality Date   ABDOMINAL HYSTERECTOMY  1990   TAH BSO   CATARACT EXTRACTION Bilateral    Dr. [name]   COLONOSCOPY WITH PROPOFOL N/A 04/08/2021   Procedure: COLONOSCOPY WITH PROPOFOL;  Surgeon: [name], MD;  Location: [facility];  Service: Endoscopy;  Laterality: N/A;   EYE SURGERY Bilateral    Cat Sx OU   OOPHORECTOMY     BSO   Vaginal Bx     Papilloma   FAMILY HISTORY Family History  Problem Relation Age of Onset   Sickle cell anemia Other    Hypertension Mother    Cancer Father        LIVER  Heart disease Brother    Cancer Brother        STOMACH   SOCIAL HISTORY Social History   Tobacco Use   Smoking status: Never   Smokeless tobacco: Never  Vaping Use   Vaping Use: Never used  Substance Use Topics   Alcohol use: No   Drug use: No       OPHTHALMIC EXAM: Base Eye Exam     Visual Acuity (Snellen - Linear)       Right Left   Dist cc 20/300 20/100   Dist ph cc NI 20/40 -1         Tonometry (Tonopen, 9:16 AM)       Right Left   Pressure 9 10         Pupils       Pupils Dark Light Shape React APD   Right PERRL 3 2 Round Minimal None   Left PERRL 3 2 Round Minimal None         Visual Fields       Left Right    Full    Restrictions  Partial outer superior temporal, inferior temporal deficiencies         Extraocular Movement       Right Left    Full, Ortho  Full, Ortho         Neuro/Psych     Oriented x3: Yes   Mood/Affect: Normal         Dilation     Both eyes: 2.5% Phenylephrine @ 9:16 AM           Slit Lamp and Fundus Exam     Slit Lamp Exam       Right Left   Lids/Lashes Dermatochalasis - upper lid, Meibomian gland dysfunction Dermatochalasis - upper lid, Meibomian gland dysfunction   Conjunctiva/Sclera Melanosis Melanosis   Cornea Arcus, trace Punctate epithelial erosions, tear film debris, 1+ [place], Well healed temporal cataract wound Arcus, Inferior 1+ Punctate epithelial erosions, well healed cataract wound   Anterior Chamber Deep and quiet Deep;  no cell or flare   Iris Round and dilated, No NVI Round and dilated, No NVI   Lens PC IOL in good position, 2+ Posterior capsular opacification Posterior chamber intraocular lens in good position, 1-2+ PCO.   Anterior Vitreous Vitreous syneresis, vitreous condensations, silicone oil bubbles Vitreous syneresis, Vitreous condensations         Fundus Exam       Right Left   Disc Sharp rim, inferior notch, 3+ pallor, inf. Rim thinning, attenuated vessels superiorly, +disc heme at 1100 -- persistent, but fading, fine vascular loops superiorly 2+ pallor, Sharp rim, thin inferior rim   C/D Ratio 0.85 0.75   Macula Blunted foveal reflex, edema/cystic changes superior mac; stable improvement in DBH superior mac, central exudates, fibrosis and atrophy, no frank heme Flat, good foveal reflex, mild RPE mottling and clumping, Epiretinal membrane, No heme or edema   Vessels superior BRVO with severe attenuation of ST arterioles, Tortuous attenuated, Tortuous   Periphery Attached, DBH superior hemisphere extended from BRVO--slightly improved Attached, mild, scattered Reticular degeneration, No heme           Refraction     Wearing Rx       Sphere Cylinder Axis Add   Right -0.75 +1.25 170 +2.50   Left -0.75 +1.00 003 +2.50    Type: PAL         Wearing Rx #2  Sphere Cylinder Axis Add   Right -0.75 +1.25 170 +2.50   Left -0.75 +1.00 003 +2.50    Type: PAL           IMAGING AND PROCEDURES  Imaging and Procedures for 03/07/18  OCT, Retina - OU - Both Eyes       Right Eye Quality was good. Central Foveal Thickness: 232. Progression has been stable. Findings include no SRF, abnormal foveal contour, subretinal hyper-reflective material, intraretinal hyper-reflective material, epiretinal membrane, intraretinal fluid, outer retinal atrophy, vitreomacular adhesion (Stable improvement in IRF/cystic changes -- just trace cystic changes remain, central SRHM and patchy ORA; diffuse retinal thinning--stable ).   Left Eye Quality was good. Central Foveal Thickness: 252. Progression has been stable. Findings include no IRF, no SRF, abnormal foveal contour, epiretinal membrane, vitreomacular adhesion (Blunted foveal depression -- stable).   Notes *Images captured and stored on drive  Diagnosis / Impression:  ERM OU OD: BRVO w/ CME -- Stable improvement in IRF/cystic changes -- just trace cystic changes remain, central SRHM and patchy ORA; diffuse retinal thinning--stable OS: very mild ERM with blunted foveal depression -- stable; mild VMA  Clinical management:  See below  Abbreviations: NFP - Normal foveal profile. CME - cystoid macular edema. PED - pigment epithelial detachment. IRF - intraretinal fluid. SRF - subretinal fluid. EZ - ellipsoid zone. ERM - epiretinal membrane. ORA - outer retinal atrophy. ORT - outer retinal tubulation. SRHM - subretinal hyper-reflective material       Intravitreal Injection, Pharmacologic Agent - OD - Right Eye       Time Out 12/09/2022. 9:50 AM. Confirmed correct patient, procedure, site, and patient consented.   Anesthesia Topical anesthesia was used. Anesthetic medications included Lidocaine 2%, Proparacaine 0.5%.   Procedure Preparation included 5% betadine to ocular surface, eyelid speculum. A (32g)  needle was used.   Injection: 2 mg aflibercept 2 MG/0.05ML   Route: Intravitreal, Site: Right Eye   NDC: [id number], Lot: [id number], Expiration date: 02/25/2024, Waste: 0 mL   Post-op Post injection exam found visual acuity of at least counting fingers. The patient tolerated the procedure well. There were no complications. The patient received written and verbal post procedure care education. Post injection medications were not given.            ASSESSMENT/PLAN:   ICD-10-CM   1. Branch retinal vein occlusion of right eye with macular edema  H34.8310 OCT, Retina - OU - Both Eyes    Intravitreal Injection, Pharmacologic Agent - OD - Right Eye    aflibercept (EYLEA) SOLN 2 mg    2. Diabetes mellitus type 2 without retinopathy ([place])  E11.9     3. Epiretinal membrane (ERM) of both eyes  H35.373     4. Pseudophakia of both eyes  Z96.1     5. Ocular hypertension of right eye  H40.051     6. Glaucoma suspect of both eyes  H40.003     7. Essential hypertension  I10     8. Hypertensive retinopathy of both eyes  H35.033      1. BRVO with CME OD  - lost to follow up from 02.22.22 to 11.1.22 -- re-presented with massive CME and BCVA 20/400 from 20/30  - initial OCT w/ CME superior macula  - s/p IVA OD #1 (10.18.19), #2 (11.19.19), #3 (12.17.19), #4 (02.19.20), #5 (03.23.20), #6 (04.21.20), #7 (05.27.20), #8 (07.07.20), #9 (08.25.20), #10 (9.29.20), #11 (11.17.20), #12 (12.22.20), #13 (01.27.21), #14 (03.10.21), #15 (04.21.21), #16 (05.26.21), #17 (06.30.21), #18 (  08.04.21), #19 (09.01.21), #20 (10.06.21), #21 (11.17.21), #22 (12.30.21), #23 (2.22.22), #24 (11.1.22), #25 (11.29.22), #26 (01.03.23), #27 (02.14.23), #28 (04.07.23), #29 (05.19.23), #30 (06.20.23) -- IVA resistance  **history of worsening IRF/cystic changes at 6+ weeks interval, IVA**  - s/p IVE OD #1 (08.11.23), #2 (09.08.23), #3 (10.06.23), #4 (11.06.23)  - BCVA OD decreased to 20/300 from 20/100  - OCT shows Stable  improvement in IRF/cystic changes -- just trace cystic changes remain, central SRHM and patchy ORA; diffuse retinal thinning--stable   - recommend IVE OD #5 today, 12.13.23, ext f/u to 6 wks.   - RBA of procedure discussed, questions answered - informed consent obtained and signed - see procedure note  - [place] informed consent form re-signed and scanned on 05.19.23  - Eylea informed consent form signed and scanned on 08.11.23  - f/u ext to 6 weeks -- DFE/OCT, possible injection   2. Diabetes mellitus, type 2 without retinopathy  - The incidence, risk factors for progression, natural history and treatment options for diabetic retinopathy  were discussed with patient.    - The need for close monitoring of blood glucose, blood pressure, and serum lipids, avoiding cigarette or any type of tobacco, and the need for long term follow up was also discussed with patient.    3. Epiretinal membrane, OU  - relatively mild ERM OU -- blunted central foveal depression  - OD with mild cystic changes 2/2 BRVO as above; OS without cystic changes or edema  - discussed findings and prognosis   - continue to monitor  4. Pseudophakia OU  - s/p CE/IOL OS 11.13.19 w/ Dr. [name]  - s/p CE/IOL OD 01.16.20 w/ Dr. [name]  - beautiful surgeries -- IOLs in perfect position  - healing well post-operatively  - IRF/CME OD may have been partly due to post op CME ([place]) in addition to BRVO  5,6. Ocular hypertension / Glaucoma suspect OD>OS  - IOP ~25 OD, 20 OS at prior clinic visits  - today IOP 9,10   - denies any family hx of Glaucoma   - on cosopt and brimonidine bid OU per Dr. Lucianne Lei  7,8. Hypertensive retinopathy OU  - pt presented to 6.24.2020 visit urgently for right sided headache above right eye with extension to occiput  - BP at that time was 201/100 -- pt was sent to primary care clinic for urgent evaluation and meds were adjusted  - BP now under better control and headaches improved  - BP reading  11.1.22 -- 180/84 -- advised f/u with PCP  - discussed importance of tight BP control  Ophthalmic Meds Ordered this visit:  Meds ordered this encounter  Medications   aflibercept (EYLEA) SOLN 2 mg     Return in about 6 weeks (around 01/20/2023) for f/u BRVO OD, DFE, OCT.  There are no Patient Instructions on file for this visit.  This document serves as a record of services personally performed by Gardiner Sleeper, MD, PhD. It was created on their behalf by San Jetty. Owens Shark, OA an ophthalmic technician. The creation of this record is the provider's dictation and/or activities during the visit.    Electronically signed by: San Jetty. Owens Shark, [place] 12.13.2023 9:08 PM  Gardiner Sleeper, M.D., Ph.D. Diseases & Surgery of the Retina and Vitreous [facility]  I have reviewed the above documentation for accuracy and completeness, and I agree with the above. Gardiner Sleeper, M.D., Ph.D. 12/09/22 9:10 PM   Abbreviations: M myopia (nearsighted); A  astigmatism; H hyperopia (farsighted); P presbyopia; Mrx spectacle prescription;  CTL contact lenses; OD right eye; OS left eye; OU both eyes  XT exotropia; ET esotropia; PEK punctate epithelial keratitis; PEE punctate epithelial erosions; DES dry eye syndrome; MGD meibomian gland dysfunction; ATs artificial tears; PFAT's preservative free artificial tears; [place] nuclear sclerotic cataract; PSC posterior subcapsular cataract; ERM epi-retinal membrane; PVD posterior vitreous detachment; RD retinal detachment; DM diabetes mellitus; DR diabetic retinopathy; NPDR non-proliferative diabetic retinopathy; PDR proliferative diabetic retinopathy; CSME clinically significant macular edema; DME diabetic macular edema; dbh dot blot hemorrhages; CWS cotton wool spot; POAG primary open angle glaucoma; C/D cup-to-disc ratio; HVF humphrey visual field; GVF goldmann visual field; OCT optical coherence tomography; IOP intraocular pressure; BRVO Branch retinal vein  occlusion; CRVO central retinal vein occlusion; CRAO central retinal artery occlusion; BRAO branch retinal artery occlusion; RT retinal tear; SB scleral buckle; PPV pars plana vitrectomy; VH Vitreous hemorrhage; PRP panretinal laser photocoagulation; IVK intravitreal kenalog; VMT vitreomacular traction; MH Macular hole;  NVD neovascularization of the disc; NVE neovascularization elsewhere; AREDS age related eye disease study; ARMD age related macular degeneration; POAG primary open angle glaucoma; EBMD epithelial/anterior basement membrane dystrophy; ACIOL anterior chamber intraocular lens; IOL intraocular lens; PCIOL posterior chamber intraocular lens; Phaco/IOL phacoemulsification with intraocular lens placement; [place] photorefractive keratectomy; LASIK laser assisted in situ keratomileusis; HTN hypertension; DM diabetes mellitus; COPD chronic obstructive pulmonary disease

## 2022-12-16 ENCOUNTER — Encounter (INDEPENDENT_AMBULATORY_CARE_PROVIDER_SITE_OTHER): Payer: Self-pay | Admitting: Ophthalmology

## 2022-12-16 DIAGNOSIS — L120 Bullous pemphigoid: Secondary | ICD-10-CM | POA: Diagnosis not present

## 2022-12-30 ENCOUNTER — Ambulatory Visit: Payer: Medicare PPO | Attending: Family Medicine | Admitting: Physical Therapy

## 2022-12-30 ENCOUNTER — Encounter: Payer: Self-pay | Admitting: Physical Therapy

## 2022-12-30 DIAGNOSIS — M5459 Other low back pain: Secondary | ICD-10-CM | POA: Diagnosis not present

## 2022-12-30 DIAGNOSIS — M6281 Muscle weakness (generalized): Secondary | ICD-10-CM | POA: Diagnosis not present

## 2022-12-30 MED ORDER — MEMANTINE HCL ER 7 MG PO CP24: 7.0000 mg | ORAL_CAPSULE | Freq: Every day | ORAL | 3 refills | Status: DC

## 2022-12-30 NOTE — Therapy (Addendum)
OUTPATIENT PHYSICAL THERAPY THORACOLUMBAR PROGRESS NOTE   Patient Name: Alejandra Davis MRN: [id number] DOB:01/01/1936, 87 y.o., female Today's Date: 12/30/2022   PT End of Session - 12/30/22 1056     Visit Number 4    Number of Visits 8    Date for PT Re-Evaluation 01/06/23    Authorization Type [employer] -  8 visits through 01/06/23    Progress Note Due on Visit 10    PT Start Time 1057    PT Stop Time 1143    PT Time Calculation (min) 46 min    Activity Tolerance Patient tolerated treatment well    Behavior During Therapy WFL for tasks assessed/performed               Past Medical History:  Diagnosis Date   Chicken pox    Cystocele    Diabetes mellitus without complication ([place])    Glaucoma    Hypertension    Hypertensive retinopathy    OU   Neuromuscular disorder ([place])    Dementia    Thyroid disease    hypothyroidism   Past Surgical History:  Procedure Laterality Date   ABDOMINAL HYSTERECTOMY  1990   TAH BSO   CATARACT EXTRACTION Bilateral    Dr. [name]   COLONOSCOPY WITH PROPOFOL N/A 04/08/2021   Procedure: COLONOSCOPY WITH PROPOFOL;  Surgeon: [name], MD;  Location: [facility];  Service: Endoscopy;  Laterality: N/A;   EYE SURGERY Bilateral    Cat Sx OU   OOPHORECTOMY     BSO   Vaginal Bx     Papilloma   Patient Active Problem List   Diagnosis Date Noted   Seborrheic dermatitis 08/27/2022   Rectal bleeding 04/10/2021   Chronic kidney disease, stage 3a ([place]) 04/06/2021   Late onset Alzheimer's dementia without behavioral disturbance ([place]) 04/06/2021   Sinus tachycardia 04/06/2021   Lower GI bleed 04/06/2021   Acute lower GI bleeding 04/05/2021   Stage 3b chronic kidney disease ([place]) 09/17/2020   Hyperlipidemia associated with type 2 diabetes mellitus ([place]) 09/17/2020   Flexural atopic dermatitis 05/30/2019   Presbycusis of both ears 05/11/2019   Acquired hypothyroidism 03/13/2019   Murmur 09/01/2018   Telogen effluvium 06/07/2018    Eczema of scalp 03/31/2018   Xerosis cutis 02/25/2018   Immunization reaction 02/08/2018   Keratosis pilaris 02/08/2018   Health care maintenance 02/07/2018   Controlled type 2 diabetes mellitus without complication, without long-term current use of insulin ([place]) 02/07/2018   Cystocele    Osteopenia    Essential hypertension       REFERRING PROVIDER: Faustino Congress, NP  REFERRING DIAG: M54.50 (ICD-10-CM) - Low back pain, unspecified  Rationale for Evaluation and Treatment: Rehabilitation  THERAPY DIAG:  Other low back pain  Muscle weakness (generalized)  ONSET DATE: approx 2 weeks ago, no known injury  SUBJECTIVE:  SUBJECTIVE STATEMENT: I don't have any more back pain.  I am much better.  I am not walking much b/c it's too cold.   PERTINENT HISTORY:  Past medical history significant for stage IIIb chronic kidney disease, hypertension, type II DM, Alzheimer's dementia, significant pelvic floor laxity  PAIN:  PAIN:  Are you having pain? Yes NPRS scale: 0/10, Pain location: central lumbar spreading bil Pain orientation: Bilateral and Lower  PAIN TYPE: aching and sharp Pain description: sharp, dull, and aching  Aggravating factors: transfers, quick movements Relieving factors: sitting in supportive chair   PRECAUTIONS: None  WEIGHT BEARING RESTRICTIONS: No  FALLS:  Has patient fallen in last 6 months? No  LIVING ENVIRONMENT: Lives with: lives alone Lives in: House/apartment Stairs: Yes: External: 5 steps; on right going up Has following equipment at home: Grab bars  OCCUPATION: retired  PLOF: Independent  PATIENT GOALS: get rid of pain, not very interested in too many exercises  NEXT MD VISIT:   OBJECTIVE:   DIAGNOSTIC FINDINGS:  CT pelvis: IMPRESSION: 1. Stable  appearance of penetrating atherosclerotic ulceration arising from the 2 o'clock position of the distal descending thoracic aorta. Maximal aortic diameter at this location remains unchanged at 2.2 cm. 2. Scattered aortic atherosclerotic calcifications throughout. No aneurysm. 3. Severe pelvic floor laxity. 4. Multiple bladder diverticula again noted. 5. Additional ancillary findings as above without significant interval change.  PATIENT SURVEYS:  FOTO 70%, goal 78% 12/30/22: 88%, met goal  SCREENING FOR RED FLAGS: Bowel or bladder incontinence: No Spinal tumors: No Cauda equina syndrome: No Compression fracture: No Abdominal aneurysm: No  COGNITION: Overall cognitive status: Within functional limits for tasks assessed     SENSATION: WFL  MUSCLE LENGTH: Hamstrings: Right 55 deg; Left 60 deg Rt piriformis and gluteals limited 25% compared to Lt   POSTURE: rounded shoulders, forward head, decreased lumbar lordosis, and decreased thoracic kyphosis  PALPATION: Tightness in paraspinals and poor facet mobility present throughout lumbar spine Rt>Lt, non-tender  LUMBAR ROM:   AROM eval  Flexion Fingers to mid shin, mild pain on Rt  Extension 12  Right lateral flexion WNL with contralateral stretch   Left lateral flexion WNL with contralateral stretch  Right rotation 25% painfree  Left rotation 25% painfree   (Blank rows = not tested)  LOWER EXTREMITY ROM:     Passive  Right eval Left eval  Hip flexion 75%   Hip extension    Hip abduction 75%   Hip adduction    Hip internal rotation WNL   Hip external rotation 75%   Knee flexion    Knee extension    Ankle dorsiflexion    Ankle plantarflexion    Ankle inversion    Ankle eversion     (Blank rows = not tested)  LOWER EXTREMITY MMT:   12/30/22: Bil hip flexors, abductors and extensors 4-/5 Core 4-/5 Knees 4/5 bil  Eval: Bil hip flexors, abductors and extensors 3+/5 Core 3+/5 Knees 4/5 bil    LUMBAR SPECIAL  TESTS:  Straight leg raise test: Negative, Slump test: Negative, and FABER test: Positive on Rt  FUNCTIONAL TESTS:  12/30/22: 5x sit to stand: 16 sec light use of UE on thighs  Eval:  5 times sit to stand: 22 sec with bil UE use and knee valgus Unable to perform standing high knee march due to poor balance in SLS Functional squat: performs with bil knee valgus and modified depth due to weakness  GAIT: Distance walked: within clinic Assistive device utilized: None  Level of assistance: Modified independence Comments: slow gait with small step length bil, lacks trunk rotation and arm swing bil  TODAY'S TREATMENT:                                                                                                                               DATE:  12/30/22: Nu-Step L4 (new model) 7 min PT present to monitor FOTO and 5x sit to stand Supine lower trunk rotation Supine figure 4 stretch 2x 30 sec Supine bridge 10x Supine clam yellow band 10x Combined bridge with yellow loop clam x10 Supine SLR 2x5 bil, VC for quad engagement to keep knee straight Standing red band rows 10x Standing red band shoulder extension 10x Standing bil heel raise, hip abd and hip ext x 10 each bil UE support Seated HS stretch 5x10" bil  12/7: Nu-Step L3 (new model) 7 min PT present to monitor Supine lumbar rotation 5x each side  SKTC 2 x 30 sec hold Supine figure 4 stretch 2x 30 sec Supine bridge 10x Supine clam green band 10x Hooklying march alt LE yellow loop at thighs x 20 Combined bridge with green loop clam x10 Supine SLR 2x5 bil Seated yellow band D2 flexion 2x5 bil Red band horiz abd x10 Seated red band rows 10x Seated red band shoulder extension 10x Sit to stand with ball between knees arms crossed x 10 Bil heel raise x 10 and hip abd x 10 with bil UE support   11/30:  Seated HS stretch on foot stool 2x 30 sec hold Supine lumbar rotation 5x each side  SKTC 2 x 30 sec hold Supine figure 4  stretch 2x 30 sec Supine bridge 10x Supine clam green band 10x Seated red plyo ball 2# chops 10x right/left Seated red band rows 10x Seated red band shoulder extension 10x Nu-Step L3 (new model) 5 min    PATIENT EDUCATION:  Education details: Access Code: [email or web address] Person educated: Patient Education method: Consulting civil engineer, Media planner, Verbal cues, and Handouts Education comprehension: verbalized understanding and returned demonstration  HOME EXERCISE PROGRAM: Access Code: [email or web address] URL: [email or web address] Date: 11/26/2022 Prepared by: Ruben Im  Exercises - Supine Lower Trunk Rotation  - 2 x daily - 7 x weekly - 1 sets - 10 reps - Hooklying Single Knee to Chest Stretch  - 2 x daily - 7 x weekly - 1 sets - 10 reps - Supine Figure 4 Piriformis Stretch  - 2 x daily - 7 x weekly - 1 sets - 2 reps - 30 sec hold - Supine Bridge  - 2 x daily - 7 x weekly - 1 sets - 10 reps - Seated Hamstring Stretch  - 2 x daily - 7 x weekly - 1 sets - 2 reps - 30 sec hold - Sit to Stand  - 1 x daily - 7 x weekly - 2 sets - 5 reps  ASSESSMENT:  CLINICAL IMPRESSION: Pt states back pain has consistently resolved.  She has improved 5X sit to stand from 22 sec to 16 sec with less UE use.  FOTO score reached 88% today exceeding goal.  She continues to have LE and trunk weakness.      OBJECTIVE IMPAIRMENTS: Abnormal gait, decreased activity tolerance, decreased balance, decreased mobility, difficulty walking, decreased ROM, decreased strength, hypomobility, impaired flexibility, improper body mechanics, postural dysfunction, and pain.   ACTIVITY LIMITATIONS: carrying, lifting, bending, squatting, stairs, transfers, bed mobility, bathing, and locomotion level  PARTICIPATION LIMITATIONS: meal prep, cleaning, laundry, driving, shopping, community activity, and yard work  PERSONAL FACTORS: Age and 1 comorbidity: memory deficits  are also affecting patient's functional outcome.   REHAB  POTENTIAL: Good  CLINICAL DECISION MAKING: Stable/uncomplicated  EVALUATION COMPLEXITY: Low   GOALS: Goals reviewed with patient? Yes  SHORT TERM GOALS: Target date: 12/09/22  Pt will be ind with initial HEP Baseline: Goal status: met  2.  Pt will return to short walks for exericse at least 3x/week. Baseline:  Goal status: not met, Pt says too cold  3.  Pt will report reduced pain by at least 20% with transitional movements. Baseline:  Goal status: met    LONG TERM GOALS: Target date: 01/06/23  Pt will be compliant with advanced HEP Baseline:  Goal status: INITIAL  2.  Pt will achieve at least 4/5 bil hip strength for improved functional task performance and transfers. Baseline:  Goal status: ongoing  3.  Pt will improve FOTO score to at least 78% Baseline: 70%, 88% on 12/30/22 Goal status: met  4.  Pt will report at least 70% improved pain with transitional movements. Baseline:  Goal status: met  5.  Pt will improve hamstring length to at least 65 deg for improved bending Baseline:  Goal status: ongoing     PLAN:  PT FREQUENCY: 1-2x/week  PT DURATION: 8 weeks  PLANNED INTERVENTIONS: Therapeutic exercises, Therapeutic activity, Neuromuscular re-education, Balance training, Gait training, Patient/Family education, Self Care, Joint mobilization, Stair training, Aquatic Therapy, Dry Needling, Electrical stimulation, Spinal mobilization, Cryotherapy, Moist heat, Taping, Traction, Ionotophoresis 90m/ml Dexamethasone, and Manual therapy.  PLAN FOR NEXT SESSION: finalize HEP, f/u on walking at gym, NuStep, review HEP, LE strength, lumbar stabilization, update HEP but keep short for compliance, encourage walking program  JBaruch Merl PT 12/30/22 11:43 AM  PHYSICAL THERAPY DISCHARGE SUMMARY  Visits from Start of Care: 4  Current functional level related to goals / functional outcomes: Pt cancelled final visit.  She joined a gym and is ready to d/c.  Has  been painfree since after first visit.   Remaining deficits: See above   Education / Equipment: HEP/joined gym   Patient agrees to discharge. Patient goals were partially met. Patient is being discharged due to being pleased with the current functional level.  Alejandra Davis, PT 01/12/23 7:50 AM   Phone: [phone]: [phone]

## 2022-12-30 NOTE — Addendum Note (Signed)
Addended by: Wyvonnia Lora on: 12/30/2022 03:12 PM   Modules accepted: Orders

## 2022-12-30 NOTE — Telephone Encounter (Addendum)
E-scribed rx 

## 2023-01-04 DIAGNOSIS — L100 Pemphigus vulgaris: Secondary | ICD-10-CM | POA: Diagnosis not present

## 2023-01-06 ENCOUNTER — Ambulatory Visit: Payer: Medicare PPO | Admitting: Physical Therapy

## 2023-01-06 DIAGNOSIS — I739 Peripheral vascular disease, unspecified: Secondary | ICD-10-CM | POA: Diagnosis not present

## 2023-01-06 DIAGNOSIS — L603 Nail dystrophy: Secondary | ICD-10-CM | POA: Diagnosis not present

## 2023-01-14 NOTE — Progress Notes (Signed)
[facility] Note  01/20/2023    CHIEF COMPLAINT Patient presents for Retina Follow Up  HISTORY OF PRESENT ILLNESS: Alejandra Davis is a 87 y.o. female who presents to the clinic today for:  HPI     Retina Follow Up   Patient presents with  CRVO/BRVO.  In right eye.  This started 6 weeks ago.  I, the attending physician,  performed the HPI with the patient and updated documentation appropriately.        Comments   Patient here for 6 weeks retina follow up for BRVO OD. Patient states vision doing ok. Sometimes a little blurred. Only lasts 1/2 a second. No eye pain.       Last edited by [name], MD on 01/20/2023 11:02 AM.    Pt states vision is stable   Referring physician: Faustino Congress, NP [place],  [place] [id number]  HISTORICAL INFORMATION:   Selected notes from the MEDICAL RECORD NUMBER Referred by [name] for DM exam   CURRENT MEDICATIONS: Current Outpatient Medications (Ophthalmic Drugs)  Medication Sig   dorzolamide-timolol (COSOPT) 22.3-6.8 MG/ML ophthalmic solution Place 1 drop into both eyes 2 (two) times daily.   No current facility-administered medications for this visit. (Ophthalmic Drugs)   Current Outpatient Medications (Other)  Medication Sig   amLODipine (NORVASC) 10 MG tablet TAKE 1 TABLET(10 MG) BY MOUTH DAILY   ASPIRIN LOW DOSE 81 MG EC tablet Take 81 mg by mouth daily.   levothyroxine (SYNTHROID) 75 MCG tablet Take 1 tablet (75 mcg total) by mouth daily.   lisinopril (ZESTRIL) 2.5 MG tablet Take 1 tablet (2.5 mg total) by mouth daily.   loratadine (CLARITIN) 10 MG tablet TAKE 1 TABLET(10 MG) BY MOUTH DAILY   memantine (NAMENDA XR) 7 MG CP24 24 hr capsule Take 1 capsule (7 mg total) by mouth daily.   metoprolol tartrate (LOPRESSOR) 50 MG tablet TAKE 1 TABLET(50 MG) BY MOUTH DAILY   diclofenac (VOLTAREN) 75 MG EC tablet Take 1 tablet (75 mg total) by mouth 2 (two) times daily.   No  current facility-administered medications for this visit. (Other)   REVIEW OF SYSTEMS: ROS   Positive for: Skin, Genitourinary, Musculoskeletal, Endocrine, Cardiovascular, Eyes Negative for: Constitutional, Gastrointestinal, Neurological, HENT, Respiratory, Psychiatric, Allergic/Imm, Heme/Lymph Last edited by [name], COA on 01/20/2023  9:40 AM.     ALLERGIES Allergies  Allergen Reactions   Donepezil Hcl     Stomach cramps    PAST MEDICAL HISTORY Past Medical History:  Diagnosis Date   Chicken pox    Cystocele    Diabetes mellitus without complication ([place])    Glaucoma    Hypertension    Hypertensive retinopathy    OU   Neuromuscular disorder ([place])    Dementia    Thyroid disease    hypothyroidism   Past Surgical History:  Procedure Laterality Date   ABDOMINAL HYSTERECTOMY  1990   TAH BSO   CATARACT EXTRACTION Bilateral    Dr. [name]   COLONOSCOPY WITH PROPOFOL N/A 04/08/2021   Procedure: COLONOSCOPY WITH PROPOFOL;  Surgeon: [name], MD;  Location: [facility];  Service: Endoscopy;  Laterality: N/A;   EYE SURGERY Bilateral    Cat Sx OU   OOPHORECTOMY     BSO   Vaginal Bx     Papilloma   FAMILY HISTORY Family History  Problem Relation Age of Onset   Sickle cell anemia Other    Hypertension Mother  Cancer Father        LIVER   Heart disease Brother    Cancer Brother        STOMACH   SOCIAL HISTORY Social History   Tobacco Use   Smoking status: Never   Smokeless tobacco: Never  Vaping Use   Vaping Use: Never used  Substance Use Topics   Alcohol use: No   Drug use: No       OPHTHALMIC EXAM: Base Eye Exam     Visual Acuity (Snellen - Linear)       Right Left   Dist cc 20/250 -2 20/100 -2   Dist ph cc 20/200 +2 20/30 -2    Correction: Glasses         Tonometry (Tonopen, 9:37 AM)       Right Left   Pressure 09 15         Pupils       Dark Light Shape React APD   Right 3 2 Round Minimal None   Left 3 2 Round  Minimal None         Visual Fields (Counting fingers)       Left Right    Full    Restrictions  Partial outer superior temporal, inferior temporal deficiencies         Extraocular Movement       Right Left    Full, Ortho Full, Ortho         Neuro/Psych     Oriented x3: Yes   Mood/Affect: Normal         Dilation     Both eyes: 1.0% Mydriacyl, 2.5% Phenylephrine @ 9:37 AM           Slit Lamp and Fundus Exam     Slit Lamp Exam       Right Left   Lids/Lashes Dermatochalasis - upper lid, Meibomian gland dysfunction Dermatochalasis - upper lid, Meibomian gland dysfunction   Conjunctiva/Sclera Melanosis Melanosis   Cornea Arcus, trace Punctate epithelial erosions, tear film debris, 1+ [place], Well healed temporal cataract wound Arcus, Inferior 1+ Punctate epithelial erosions, well healed cataract wound   Anterior Chamber Deep and quiet Deep;  no cell or flare   Iris Round and dilated, No NVI Round and dilated, No NVI   Lens PC IOL in good position, 2+ Posterior capsular opacification Posterior chamber intraocular lens in good position, 1-2+ PCO.   Anterior Vitreous Vitreous syneresis, vitreous condensations, silicone oil bubbles Vitreous syneresis, Vitreous condensations         Fundus Exam       Right Left   Disc Sharp rim, inferior notch, 3+ pallor, inf. Rim thinning, attenuated vessels superiorly, +disc heme at 1100 -- persistent, but fading, fine vascular loops superiorly 2+ pallor, Sharp rim, thin inferior rim   C/D Ratio 0.85 0.75   Macula Blunted foveal reflex, edema/cystic changes superior mac; stable improvement in DBH superior mac, central exudates, fibrosis and atrophy -- stable, no frank heme Flat, good foveal reflex, mild RPE mottling and clumping, Epiretinal membrane, No heme or edema   Vessels superior BRVO with severe attenuation of ST arterioles, Tortuous attenuated, Tortuous   Periphery Attached, DBH superior hemisphere extended from BRVO --  improved, almost resolved Attached, mild, scattered Reticular degeneration, No heme           Refraction     Wearing Rx       Sphere Cylinder Axis Add   Right -0.75 +1.25 170 +2.50   Left -  0.75 +1.00 003 +2.50    Type: PAL         Wearing Rx #2       Sphere Cylinder Axis Add   Right -0.75 +1.25 170 +2.50   Left -0.75 +1.00 003 +2.50    Type: PAL           IMAGING AND PROCEDURES  Imaging and Procedures for 03/07/18  OCT, Retina - OU - Both Eyes       Right Eye Quality was good. Central Foveal Thickness: 230. Progression has improved. Findings include no SRF, abnormal foveal contour, intraretinal hyper-reflective material, epiretinal membrane, intraretinal fluid, outer retinal atrophy, vitreomacular adhesion (trace persistent cystic changes--slightly improved, central SRHM and patchy ORA; diffuse retinal thinning--stable ).   Left Eye Quality was good. Central Foveal Thickness: 252. Progression has been stable. Findings include no IRF, no SRF, abnormal foveal contour, epiretinal membrane, vitreomacular adhesion (Blunted foveal depression -- stable).   Notes *Images captured and stored on drive  Diagnosis / Impression:  ERM OU OD: BRVO w/ CME -- trace persistent cystic changes--slightly improved, central SRHM and patchy ORA; diffuse retinal thinning--stable OS: very mild ERM with blunted foveal depression -- stable; mild VMA  Clinical management:  See below  Abbreviations: NFP - Normal foveal profile. CME - cystoid macular edema. PED - pigment epithelial detachment. IRF - intraretinal fluid. SRF - subretinal fluid. EZ - ellipsoid zone. ERM - epiretinal membrane. ORA - outer retinal atrophy. ORT - outer retinal tubulation. SRHM - subretinal hyper-reflective material       Intravitreal Injection, Pharmacologic Agent - OD - Right Eye       Time Out 01/20/2023. 10:24 AM. Confirmed correct patient, procedure, site, and patient consented.    Anesthesia Topical anesthesia was used. Anesthetic medications included Lidocaine 2%, Proparacaine 0.5%.   Procedure Preparation included 5% betadine to ocular surface, eyelid speculum. A (32g) needle was used.   Injection: 2 mg aflibercept 2 MG/0.05ML   Route: Intravitreal, Site: Right Eye   NDC: [id number], Lot: [id number], Expiration date: 04/27/2023, Waste: 0 mL   Post-op Post injection exam found visual acuity of at least counting fingers. The patient tolerated the procedure well. There were no complications. The patient received written and verbal post procedure care education. Post injection medications were not given.            ASSESSMENT/PLAN:   ICD-10-CM   1. Branch retinal vein occlusion of right eye with macular edema  H34.8310 OCT, Retina - OU - Both Eyes    Intravitreal Injection, Pharmacologic Agent - OD - Right Eye    aflibercept (EYLEA) SOLN 2 mg    2. Diabetes mellitus type 2 without retinopathy ([place])  E11.9     3. Epiretinal membrane (ERM) of both eyes  H35.373     4. Pseudophakia of both eyes  Z96.1     5. Ocular hypertension of right eye  H40.051     6. Glaucoma suspect of both eyes  H40.003     7. Essential hypertension  I10     8. Hypertensive retinopathy of both eyes  H35.033      1. BRVO with CME OD - lost to follow up from 02.22.22 to 11.1.22 -- re-presented with massive CME and BCVA 20/400 from 20/30  - initial OCT w/ CME superior macula - s/p IVA OD #1 (10.18.19), #2 (11.19.19), #3 (12.17.19), #4 (02.19.20), #5 (03.23.20), #6 (04.21.20), #7 (05.27.20), #8 (07.07.20), #9 (08.25.20), #10 (9.29.20), #11 (11.17.20), #12 (12.22.20), #13 (01.27.21), #14 (  03.10.21), #15 (04.21.21), #16 (05.26.21), #17 (06.30.21), #18 (08.04.21), #19 (09.01.21), #20 (10.06.21), #21 (11.17.21), #22 (12.30.21), #23 (2.22.22), #24 (11.1.22), #25 (11.29.22), #26 (01.03.23), #27 (02.14.23), #28 (04.07.23), #29 (05.19.23), #30 (06.20.23) -- IVA resistance  **history of  worsening IRF/cystic changes at 6+ weeks interval, IVA** - s/p IVE OD #1 (08.11.23), #2 (09.08.23), #3 (10.06.23), #4 (11.06.23), #5 (12.13.23)  - BCVA OD 20/200  - OCT shows persistent trace cystic changes--slightly improved, central SRHM and patchy ORA; diffuse retinal thinning--stable at 6 weeks  - recommend IVE OD #6 today, 01.24.24, ext f/u to 7 wks.   - RBA of procedure discussed, questions answered - informed consent obtained and signed - see procedure note   - [place] informed consent form re-signed and scanned on 05.19.23  - Eylea informed consent form signed and scanned on 08.11.23  - f/u ext to 7 weeks -- DFE/OCT, possible injection   2. Diabetes mellitus, type 2 without retinopathy - The incidence, risk factors for progression, natural history and treatment options for diabetic retinopathy  were discussed with patient.   - The need for close monitoring of blood glucose, blood pressure, and serum lipids, avoiding cigarette or any type of tobacco, and the need for long term follow up was also discussed with patient.    3. Epiretinal membrane, OU  - relatively mild ERM OU -- blunted central foveal depression - OD with mild cystic changes 2/2 BRVO as above; OS without cystic changes or edema  - discussed findings and prognosis   - continue to monitor  4. Pseudophakia OU  - s/p CE/IOL OS 11.13.19 w/ Dr. [name]  - s/p CE/IOL OD 01.16.20 w/ Dr. [name]  - beautiful surgeries -- IOLs in perfect position  - healing well post-operatively - IRF/CME OD may have been partly due to post op CME ([place]) in addition to BRVO  5,6. Ocular hypertension / Glaucoma suspect OD>OS  - IOP OD 9, OS 15   - denies any family hx of Glaucoma   - on Cosopt and Brimonidine bid OU per Dr. Lucianne Lei  7,8. Hypertensive retinopathy OU - pt presented to 6.24.2020 visit urgently for right sided headache above right eye with extension to occiput - BP at that time was 201/100 -- pt was sent to primary care  clinic for urgent evaluation and meds were adjusted  - BP now under better control and headaches improved  - BP reading 11.1.22 -- 180/84 -- advised f/u with PCP  - discussed importance of tight BP control  Ophthalmic Meds Ordered this visit:  Meds ordered this encounter  Medications   aflibercept (EYLEA) SOLN 2 mg     Return in about 7 weeks (around 03/10/2023) for BRVO OD , DFE, OCT, Possible Injxn.  There are no Patient Instructions on file for this visit.  This document serves as a record of services personally performed by Gardiner Sleeper, MD, PhD. It was created on their behalf by Orvan Falconer, an ophthalmic technician. The creation of this record is the provider's dictation and/or activities during the visit.    Electronically signed by: Orvan Falconer, OA, 01/20/23  11:06 AM  This document serves as a record of services personally performed by Gardiner Sleeper, MD, PhD. It was created on their behalf by San Jetty. Owens Shark, OA an ophthalmic technician. The creation of this record is the provider's dictation and/or activities during the visit.    Electronically signed by: San Jetty. [place], [place] 01.24.2024 11:06 AM  Gardiner Sleeper, M.D., Ph.D. Diseases &  Surgery of the Retina and Vitreous [facility]  I have reviewed the above documentation for accuracy and completeness, and I agree with the above. Gardiner Sleeper, M.D., Ph.D. 01/20/23 11:07 AM   Abbreviations: M myopia (nearsighted); A astigmatism; H hyperopia (farsighted); P presbyopia; Mrx spectacle prescription;  CTL contact lenses; OD right eye; OS left eye; OU both eyes  XT exotropia; ET esotropia; PEK punctate epithelial keratitis; PEE punctate epithelial erosions; DES dry eye syndrome; MGD meibomian gland dysfunction; ATs artificial tears; PFAT's preservative free artificial tears; [place] nuclear sclerotic cataract; PSC posterior subcapsular cataract; ERM epi-retinal membrane; PVD posterior vitreous  detachment; RD retinal detachment; DM diabetes mellitus; DR diabetic retinopathy; NPDR non-proliferative diabetic retinopathy; PDR proliferative diabetic retinopathy; CSME clinically significant macular edema; DME diabetic macular edema; dbh dot blot hemorrhages; CWS cotton wool spot; POAG primary open angle glaucoma; C/D cup-to-disc ratio; HVF humphrey visual field; GVF goldmann visual field; OCT optical coherence tomography; IOP intraocular pressure; BRVO Branch retinal vein occlusion; CRVO central retinal vein occlusion; CRAO central retinal artery occlusion; BRAO branch retinal artery occlusion; RT retinal tear; SB scleral buckle; PPV pars plana vitrectomy; VH Vitreous hemorrhage; PRP panretinal laser photocoagulation; IVK intravitreal kenalog; VMT vitreomacular traction; MH Macular hole;  NVD neovascularization of the disc; NVE neovascularization elsewhere; AREDS age related eye disease study; ARMD age related macular degeneration; POAG primary open angle glaucoma; EBMD epithelial/anterior basement membrane dystrophy; ACIOL anterior chamber intraocular lens; IOL intraocular lens; PCIOL posterior chamber intraocular lens; Phaco/IOL phacoemulsification with intraocular lens placement; [place] photorefractive keratectomy; LASIK laser assisted in situ keratomileusis; HTN hypertension; DM diabetes mellitus; COPD chronic obstructive pulmonary disease

## 2023-01-20 ENCOUNTER — Ambulatory Visit (INDEPENDENT_AMBULATORY_CARE_PROVIDER_SITE_OTHER): Payer: Medicare PPO | Admitting: Ophthalmology

## 2023-01-20 ENCOUNTER — Encounter (INDEPENDENT_AMBULATORY_CARE_PROVIDER_SITE_OTHER): Payer: Self-pay | Admitting: Ophthalmology

## 2023-01-20 DIAGNOSIS — I10 Essential (primary) hypertension: Secondary | ICD-10-CM | POA: Diagnosis not present

## 2023-01-20 DIAGNOSIS — H35033 Hypertensive retinopathy, bilateral: Secondary | ICD-10-CM

## 2023-01-20 DIAGNOSIS — E119 Type 2 diabetes mellitus without complications: Secondary | ICD-10-CM

## 2023-01-20 DIAGNOSIS — H40051 Ocular hypertension, right eye: Secondary | ICD-10-CM | POA: Diagnosis not present

## 2023-01-20 DIAGNOSIS — H40003 Preglaucoma, unspecified, bilateral: Secondary | ICD-10-CM | POA: Diagnosis not present

## 2023-01-20 DIAGNOSIS — Z961 Presence of intraocular lens: Secondary | ICD-10-CM | POA: Diagnosis not present

## 2023-01-20 DIAGNOSIS — H34831 Tributary (branch) retinal vein occlusion, right eye, with macular edema: Secondary | ICD-10-CM

## 2023-01-20 DIAGNOSIS — H35373 Puckering of macula, bilateral: Secondary | ICD-10-CM | POA: Diagnosis not present

## 2023-01-20 MED ORDER — AFLIBERCEPT 2MG/0.05ML IZ SOLN FOR KALEIDOSCOPE
2.0000 mg | INTRAVITREAL | Status: AC | PRN
  Administered 2023-01-20: 2 mg via INTRAVITREAL

## 2023-03-08 DIAGNOSIS — H401190 Primary open-angle glaucoma, unspecified eye, stage unspecified: Secondary | ICD-10-CM | POA: Diagnosis not present

## 2023-03-08 DIAGNOSIS — R051 Acute cough: Secondary | ICD-10-CM | POA: Diagnosis not present

## 2023-03-08 DIAGNOSIS — I10 Essential (primary) hypertension: Secondary | ICD-10-CM | POA: Diagnosis not present

## 2023-03-08 DIAGNOSIS — I700 Atherosclerosis of aorta: Secondary | ICD-10-CM | POA: Diagnosis not present

## 2023-03-08 DIAGNOSIS — E039 Hypothyroidism, unspecified: Secondary | ICD-10-CM | POA: Diagnosis not present

## 2023-03-08 DIAGNOSIS — E113493 Type 2 diabetes mellitus with severe nonproliferative diabetic retinopathy without macular edema, bilateral: Secondary | ICD-10-CM | POA: Diagnosis not present

## 2023-03-08 DIAGNOSIS — F0390 Unspecified dementia without behavioral disturbance: Secondary | ICD-10-CM | POA: Diagnosis not present

## 2023-03-08 DIAGNOSIS — Z66 Do not resuscitate: Secondary | ICD-10-CM | POA: Diagnosis not present

## 2023-03-08 DIAGNOSIS — N1831 Chronic kidney disease, stage 3a: Secondary | ICD-10-CM | POA: Diagnosis not present

## 2023-03-10 ENCOUNTER — Encounter (INDEPENDENT_AMBULATORY_CARE_PROVIDER_SITE_OTHER): Payer: Medicare PPO | Admitting: Ophthalmology

## 2023-03-10 DIAGNOSIS — H34831 Tributary (branch) retinal vein occlusion, right eye, with macular edema: Secondary | ICD-10-CM

## 2023-03-10 DIAGNOSIS — Z961 Presence of intraocular lens: Secondary | ICD-10-CM

## 2023-03-10 DIAGNOSIS — H40003 Preglaucoma, unspecified, bilateral: Secondary | ICD-10-CM

## 2023-03-10 DIAGNOSIS — E119 Type 2 diabetes mellitus without complications: Secondary | ICD-10-CM

## 2023-03-10 DIAGNOSIS — H35373 Puckering of macula, bilateral: Secondary | ICD-10-CM

## 2023-03-10 DIAGNOSIS — H40051 Ocular hypertension, right eye: Secondary | ICD-10-CM

## 2023-03-14 ENCOUNTER — Encounter (INDEPENDENT_AMBULATORY_CARE_PROVIDER_SITE_OTHER): Payer: Self-pay | Admitting: Ophthalmology

## 2023-03-16 ENCOUNTER — Encounter (INDEPENDENT_AMBULATORY_CARE_PROVIDER_SITE_OTHER): Payer: Medicare PPO | Admitting: Ophthalmology

## 2023-03-16 DIAGNOSIS — H35033 Hypertensive retinopathy, bilateral: Secondary | ICD-10-CM

## 2023-03-16 DIAGNOSIS — I10 Essential (primary) hypertension: Secondary | ICD-10-CM

## 2023-03-16 DIAGNOSIS — Z961 Presence of intraocular lens: Secondary | ICD-10-CM

## 2023-03-16 DIAGNOSIS — E119 Type 2 diabetes mellitus without complications: Secondary | ICD-10-CM

## 2023-03-16 DIAGNOSIS — H35373 Puckering of macula, bilateral: Secondary | ICD-10-CM

## 2023-03-16 DIAGNOSIS — H40003 Preglaucoma, unspecified, bilateral: Secondary | ICD-10-CM

## 2023-03-16 DIAGNOSIS — H34831 Tributary (branch) retinal vein occlusion, right eye, with macular edema: Secondary | ICD-10-CM

## 2023-03-16 DIAGNOSIS — H40051 Ocular hypertension, right eye: Secondary | ICD-10-CM

## 2023-03-18 NOTE — Progress Notes (Signed)
[facility] Note  03/24/2023    CHIEF COMPLAINT Patient presents for Retina Follow Up  HISTORY OF PRESENT ILLNESS: Alejandra Davis is a 87 y.o. female who presents to the clinic today for:   HPI     Retina Follow Up   Patient presents with  CRVO/BRVO.  In right eye.  This started 9 weeks ago.  Duration of 9 weeks.  Since onset it is stable.  I, the attending physician,  performed the HPI with the patient and updated documentation appropriately.        Comments   9 week retina follow up BRVO OD and I'VE OD pt is reporting no vision changes noticed she denies any flashes or floaters       Last edited by [name], MD on 03/24/2023 11:44 AM.    Pt is delayed to follow up due to having covid  Referring physician: Faustino Congress, NP [place],  [place] [id number]  HISTORICAL INFORMATION:   Selected notes from the MEDICAL RECORD NUMBER Referred by [name] for DM exam   CURRENT MEDICATIONS: Current Outpatient Medications (Ophthalmic Drugs)  Medication Sig   dorzolamide-timolol (COSOPT) 22.3-6.8 MG/ML ophthalmic solution Place 1 drop into both eyes 2 (two) times daily.   No current facility-administered medications for this visit. (Ophthalmic Drugs)   Current Outpatient Medications (Other)  Medication Sig   amLODipine (NORVASC) 10 MG tablet TAKE 1 TABLET(10 MG) BY MOUTH DAILY   ASPIRIN LOW DOSE 81 MG EC tablet Take 81 mg by mouth daily.   diclofenac (VOLTAREN) 75 MG EC tablet Take 1 tablet (75 mg total) by mouth 2 (two) times daily.   levothyroxine (SYNTHROID) 75 MCG tablet Take 1 tablet (75 mcg total) by mouth daily.   lisinopril (ZESTRIL) 2.5 MG tablet Take 1 tablet (2.5 mg total) by mouth daily.   loratadine (CLARITIN) 10 MG tablet TAKE 1 TABLET(10 MG) BY MOUTH DAILY   memantine (NAMENDA XR) 7 MG CP24 24 hr capsule Take 1 capsule (7 mg total) by mouth daily.   metoprolol tartrate (LOPRESSOR) 50 MG tablet TAKE 1  TABLET(50 MG) BY MOUTH DAILY   No current facility-administered medications for this visit. (Other)   REVIEW OF SYSTEMS: ROS   Positive for: Skin, Genitourinary, Musculoskeletal, Endocrine, Cardiovascular, Eyes Negative for: Constitutional, Gastrointestinal, Neurological, HENT, Respiratory, Psychiatric, Allergic/Imm, Heme/Lymph Last edited by Parthenia Ames, COT on 03/24/2023  9:35 AM.      ALLERGIES Allergies  Allergen Reactions   Donepezil Hcl     Stomach cramps    PAST MEDICAL HISTORY Past Medical History:  Diagnosis Date   Chicken pox    Cystocele    Diabetes mellitus without complication ([place])    Glaucoma    Hypertension    Hypertensive retinopathy    OU   Neuromuscular disorder ([place])    Dementia    Thyroid disease    hypothyroidism   Past Surgical History:  Procedure Laterality Date   ABDOMINAL HYSTERECTOMY  1990   TAH BSO   CATARACT EXTRACTION Bilateral    Dr. [name]   COLONOSCOPY WITH PROPOFOL N/A 04/08/2021   Procedure: COLONOSCOPY WITH PROPOFOL;  Surgeon: [name], MD;  Location: [facility];  Service: Endoscopy;  Laterality: N/A;   EYE SURGERY Bilateral    Cat Sx OU   OOPHORECTOMY     BSO   Vaginal Bx     Papilloma   FAMILY HISTORY Family History  Problem Relation Age of  Onset   Sickle cell anemia Other    Hypertension Mother    Cancer Father        LIVER   Heart disease Brother    Cancer Brother        STOMACH   SOCIAL HISTORY Social History   Tobacco Use   Smoking status: Never   Smokeless tobacco: Never  Vaping Use   Vaping Use: Never used  Substance Use Topics   Alcohol use: No   Drug use: No       OPHTHALMIC EXAM: Base Eye Exam     Visual Acuity (Snellen - Linear)       Right Left   Dist cc CF at 3' 20/300   Dist ph cc NI 20/70    Correction: Glasses         Tonometry (Tonopen, 9:39 AM)       Right Left   Pressure 12 12         Pupils       Pupils Dark Light Shape React APD   Right PERRL  3 2 Round Brisk None   Left PERRL 3 2 Round Brisk None         Visual Fields       Left Right   Restrictions  Partial outer superior temporal, inferior temporal deficiencies         Extraocular Movement       Right Left    Full, Ortho Full, Ortho         Neuro/Psych     Oriented x3: Yes   Mood/Affect: Normal         Dilation     Both eyes: 2.5% Phenylephrine @ 9:39 AM           Slit Lamp and Fundus Exam     Slit Lamp Exam       Right Left   Lids/Lashes Dermatochalasis - upper lid, Meibomian gland dysfunction Dermatochalasis - upper lid, Meibomian gland dysfunction   Conjunctiva/Sclera Melanosis Melanosis   Cornea Arcus, trace Punctate epithelial erosions, tear film debris, 1+ [place], Well healed temporal cataract wound Arcus, Inferior 1+ Punctate epithelial erosions, well healed cataract wound   Anterior Chamber Deep and quiet Deep;  no cell or flare   Iris Round and dilated, No NVI Round and dilated, No NVI   Lens PC IOL in good position, 2+ Posterior capsular opacification Posterior chamber intraocular lens in good position, 1-2+ PCO.   Anterior Vitreous Vitreous syneresis, vitreous condensations, silicone oil bubbles Vitreous syneresis, Vitreous condensations         Fundus Exam       Right Left   Disc Sharp rim, inferior notch, 3+ pallor, inf. Rim thinning, attenuated vessels superiorly, +disc heme at 1100 -- persistent, but fading, fine vascular loops superiorly 2+ pallor, Sharp rim, thin inferior rim   C/D Ratio 0.85 0.75   Macula Blunted foveal reflex, edema/cystic changes superior mac -- slightly increased; stable improvement in DBH superior mac, central exudates, fibrosis and atrophy -- stable, no frank heme Flat, good foveal reflex, mild RPE mottling and clumping, Epiretinal membrane, No heme or edema   Vessels superior BRVO with severe attenuation of ST arterioles, Tortuous attenuated, Tortuous   Periphery Attached, DBH superior hemisphere  extended from BRVO -- improved, almost resolved Attached, mild, scattered Reticular degeneration, No heme           Refraction     Wearing Rx       Sphere Cylinder Axis Add  Right -0.75 +1.25 170 +2.50   Left -0.75 +1.00 003 +2.50    Type: PAL         Wearing Rx #2       Sphere Cylinder Axis Add   Right -0.75 +1.25 170 +2.50   Left -0.75 +1.00 003 +2.50    Type: PAL           IMAGING AND PROCEDURES  Imaging and Procedures for 03/07/18  OCT, Retina - OU - Both Eyes       Right Eye Quality was good. Central Foveal Thickness: 262. Progression has worsened. Findings include no SRF, abnormal foveal contour, subretinal hyper-reflective material, intraretinal hyper-reflective material, epiretinal membrane, intraretinal fluid, outer retinal atrophy, vitreomacular adhesion (Mild interval increase in cystic changes, central SRHM and patchy ORA; diffuse retinal thinning -- stable ).   Left Eye Quality was good. Central Foveal Thickness: 251. Progression has been stable. Findings include no IRF, no SRF, abnormal foveal contour, epiretinal membrane, vitreomacular adhesion (Blunted foveal depression -- stable).   Notes *Images captured and stored on drive  Diagnosis / Impression:  ERM OU OD: BRVO w/ CME -- Mild interval increase in cystic changes, central SRHM and patchy ORA; diffuse retinal thinning -- stable OS: very mild ERM with blunted foveal depression -- stable; mild VMA  Clinical management:  See below  Abbreviations: NFP - Normal foveal profile. CME - cystoid macular edema. PED - pigment epithelial detachment. IRF - intraretinal fluid. SRF - subretinal fluid. EZ - ellipsoid zone. ERM - epiretinal membrane. ORA - outer retinal atrophy. ORT - outer retinal tubulation. SRHM - subretinal hyper-reflective material       Intravitreal Injection, Pharmacologic Agent - OD - Right Eye       Time Out 03/24/2023. 10:56 AM. Confirmed correct patient, procedure, site,  and patient consented.   Anesthesia Topical anesthesia was used. Anesthetic medications included Lidocaine 2%, Proparacaine 0.5%.   Procedure Preparation included 5% betadine to ocular surface, eyelid speculum. A (32g) needle was used.   Injection: 2 mg aflibercept 2 MG/0.05ML   Route: Intravitreal, Site: Right Eye   NDC: [id number], Lot: [id number], Expiration date: 10/28/2023, Waste: 0 mL   Post-op Post injection exam found visual acuity of at least counting fingers. The patient tolerated the procedure well. There were no complications. The patient received written and verbal post procedure care education. Post injection medications were not given.            ASSESSMENT/PLAN:   ICD-10-CM   1. Branch retinal vein occlusion of right eye with macular edema  H34.8310 OCT, Retina - OU - Both Eyes    Intravitreal Injection, Pharmacologic Agent - OD - Right Eye    aflibercept (EYLEA) SOLN 2 mg    2. Diabetes mellitus type 2 without retinopathy ([place])  E11.9     3. Epiretinal membrane (ERM) of both eyes  H35.373     4. Pseudophakia of both eyes  Z96.1     5. Ocular hypertension of right eye  H40.051     6. Glaucoma suspect of both eyes  H40.003     7. Essential hypertension  I10     8. Hypertensive retinopathy of both eyes  H35.033       1. BRVO with CME OD  - delayed f/u -- 9 wks instead of 6 due to COVID infxn - lost to follow up from 02.22.22 to 11.1.22 -- re-presented with massive CME and BCVA 20/400 from 20/30  - initial OCT w/ CME superior  macula - s/p IVA OD #1 (10.18.19), #2 (11.19.19), #3 (12.17.19), #4 (02.19.20), #5 (03.23.20), #6 (04.21.20), #7 (05.27.20), #8 (07.07.20), #9 (08.25.20), #10 (9.29.20), #11 (11.17.20), #12 (12.22.20), #13 (01.27.21), #14 (03.10.21), #15 (04.21.21), #16 (05.26.21), #17 (06.30.21), #18 (08.04.21), #19 (09.01.21), #20 (10.06.21), #21 (11.17.21), #22 (12.30.21), #23 (2.22.22), #24 (11.1.22), #25 (11.29.22), #26 (01.03.23), #27  (02.14.23), #28 (04.07.23), #29 (05.19.23), #30 (06.20.23) -- IVA resistance  **history of worsening IRF/cystic changes at 6+ weeks interval, IVA** - s/p IVE OD #1 (08.11.23), #2 (09.08.23), #3 (10.06.23), #4 (11.06.23), #5 (12.13.23), #6 (01.24.24)  - BCVA OD decreased to CF from 20/200  - OCT shows OD: Mild interval increase in cystic changes, central SRHM and patchy ORA; diffuse retinal thinning at 9 weeks  - recommend IVE OD #7 today, 03.27.24, w/ f/u back to 6-7 wks.   - RBA of procedure discussed, questions answered - informed consent obtained and signed - see procedure note   - [place] informed consent form re-signed and scanned on 05.19.23  - Eylea informed consent form signed and scanned on 08.11.23  - f/u in 6-7 weeks -- DFE/OCT, possible injection   2. Diabetes mellitus, type 2 without retinopathy - The incidence, risk factors for progression, natural history and treatment options for diabetic retinopathy  were discussed with patient.   - The need for close monitoring of blood glucose, blood pressure, and serum lipids, avoiding cigarette or any type of tobacco, and the need for long term follow up was also discussed with patient.    3. Epiretinal membrane, OU  - relatively mild ERM OU -- blunted central foveal depression - OD with mild cystic changes 2/2 BRVO as above; OS without cystic changes or edema  - discussed findings and prognosis   - continue to monitor  4. Pseudophakia OU  - s/p CE/IOL OS 11.13.19 w/ Dr. [name]  - s/p CE/IOL OD 01.16.20 w/ Dr. [name]  - beautiful surgeries -- IOLs in perfect position  - healing well post-operatively - IRF/CME OD may have been partly due to post op CME ([place]) in addition to BRVO  5,6. Ocular hypertension / Glaucoma suspect OD>OS  - IOP OD 9, OS 15   - denies any family hx of Glaucoma   - on Cosopt and Brimonidine bid OU per Dr. Lucianne Lei  7,8. Hypertensive retinopathy OU - pt presented to 6.24.2020 visit urgently for right  sided headache above right eye with extension to occiput - BP at that time was 201/100 -- pt was sent to primary care clinic for urgent evaluation and meds were adjusted  - BP now under better control and headaches improved  - BP reading 11.1.22 -- 180/84 -- advised f/u with PCP  - discussed importance of tight BP control  Ophthalmic Meds Ordered this visit:  Meds ordered this encounter  Medications   aflibercept (EYLEA) SOLN 2 mg     Return for f/u 6-7 weeks, BRVO OD, DFE, OCT.  There are no Patient Instructions on file for this visit.  This document serves as a record of services personally performed by Gardiner Sleeper, MD, PhD. It was created on their behalf by Orvan Falconer, an ophthalmic technician. The creation of this record is the provider's dictation and/or activities during the visit.    Electronically signed by: Orvan Falconer, OA, 03/24/23  11:53 AM  Gardiner Sleeper, M.D., Ph.D. Diseases & Surgery of the Retina and Vitreous [facility]  I have reviewed the above documentation for accuracy and completeness,  and I agree with the above. Gardiner Sleeper, M.D., Ph.D. 03/25/23 12:20 AM  Abbreviations: M myopia (nearsighted); A astigmatism; H hyperopia (farsighted); P presbyopia; Mrx spectacle prescription;  CTL contact lenses; OD right eye; OS left eye; OU both eyes  XT exotropia; ET esotropia; PEK punctate epithelial keratitis; PEE punctate epithelial erosions; DES dry eye syndrome; MGD meibomian gland dysfunction; ATs artificial tears; PFAT's preservative free artificial tears; [place] nuclear sclerotic cataract; PSC posterior subcapsular cataract; ERM epi-retinal membrane; PVD posterior vitreous detachment; RD retinal detachment; DM diabetes mellitus; DR diabetic retinopathy; NPDR non-proliferative diabetic retinopathy; PDR proliferative diabetic retinopathy; CSME clinically significant macular edema; DME diabetic macular edema; dbh dot blot hemorrhages;  CWS cotton wool spot; POAG primary open angle glaucoma; C/D cup-to-disc ratio; HVF humphrey visual field; GVF goldmann visual field; OCT optical coherence tomography; IOP intraocular pressure; BRVO Branch retinal vein occlusion; CRVO central retinal vein occlusion; CRAO central retinal artery occlusion; BRAO branch retinal artery occlusion; RT retinal tear; SB scleral buckle; PPV pars plana vitrectomy; VH Vitreous hemorrhage; PRP panretinal laser photocoagulation; IVK intravitreal kenalog; VMT vitreomacular traction; MH Macular hole;  NVD neovascularization of the disc; NVE neovascularization elsewhere; AREDS age related eye disease study; ARMD age related macular degeneration; POAG primary open angle glaucoma; EBMD epithelial/anterior basement membrane dystrophy; ACIOL anterior chamber intraocular lens; IOL intraocular lens; PCIOL posterior chamber intraocular lens; Phaco/IOL phacoemulsification with intraocular lens placement; [place] photorefractive keratectomy; LASIK laser assisted in situ keratomileusis; HTN hypertension; DM diabetes mellitus; COPD chronic obstructive pulmonary disease

## 2023-03-24 ENCOUNTER — Ambulatory Visit (INDEPENDENT_AMBULATORY_CARE_PROVIDER_SITE_OTHER): Payer: Medicare PPO | Admitting: Ophthalmology

## 2023-03-24 ENCOUNTER — Encounter (INDEPENDENT_AMBULATORY_CARE_PROVIDER_SITE_OTHER): Payer: Self-pay | Admitting: Ophthalmology

## 2023-03-24 DIAGNOSIS — H35373 Puckering of macula, bilateral: Secondary | ICD-10-CM | POA: Diagnosis not present

## 2023-03-24 DIAGNOSIS — Z961 Presence of intraocular lens: Secondary | ICD-10-CM

## 2023-03-24 DIAGNOSIS — I10 Essential (primary) hypertension: Secondary | ICD-10-CM | POA: Diagnosis not present

## 2023-03-24 DIAGNOSIS — H40051 Ocular hypertension, right eye: Secondary | ICD-10-CM

## 2023-03-24 DIAGNOSIS — E119 Type 2 diabetes mellitus without complications: Secondary | ICD-10-CM

## 2023-03-24 DIAGNOSIS — H35033 Hypertensive retinopathy, bilateral: Secondary | ICD-10-CM | POA: Diagnosis not present

## 2023-03-24 DIAGNOSIS — H34831 Tributary (branch) retinal vein occlusion, right eye, with macular edema: Secondary | ICD-10-CM

## 2023-03-24 DIAGNOSIS — H40003 Preglaucoma, unspecified, bilateral: Secondary | ICD-10-CM

## 2023-03-24 MED ORDER — AFLIBERCEPT 2MG/0.05ML IZ SOLN FOR KALEIDOSCOPE
2.0000 mg | INTRAVITREAL | Status: AC | PRN
  Administered 2023-03-24: 2 mg via INTRAVITREAL

## 2023-04-06 DIAGNOSIS — L84 Corns and callosities: Secondary | ICD-10-CM | POA: Diagnosis not present

## 2023-04-06 DIAGNOSIS — L603 Nail dystrophy: Secondary | ICD-10-CM | POA: Diagnosis not present

## 2023-04-06 DIAGNOSIS — I739 Peripheral vascular disease, unspecified: Secondary | ICD-10-CM | POA: Diagnosis not present

## 2023-04-13 NOTE — Patient Instructions (Signed)
Below is our plan:  We will continue memantine XR 7mg  daily.   Please make sure you are staying well hydrated. I recommend 50-60 ounces daily. Well balanced diet and regular exercise encouraged. Consistent sleep schedule with 6-8 hours recommended.   Please continue follow up with care team as directed.   Follow up with me in 6-12 months   You may receive a survey regarding today's visit. I encourage you to leave honest feed back as I do use this information to improve patient care. Thank you for seeing me today!   Management of Memory Problems   There are some general things you can do to help manage your memory problems.  Your memory may not in fact recover, but by using techniques and strategies you will be able to manage your memory difficulties better.   1)  Establish a routine. Try to establish and then stick to a regular routine.  By doing this, you will get used to what to expect and you will reduce the need to rely on your memory.  Also, try to do things at the same time of day, such as taking your medication or checking your calendar first thing in the morning. Think about think that you can do as a part of a regular routine and make a list.  Then enter them into a daily planner to remind you.  This will help you establish a routine.   2)  Organize your environment. Organize your environment so that it is uncluttered.  Decrease visual stimulation.  Place everyday items such as keys or cell phone in the same place every day (ie.  Basket next to front door) Use post it notes with a brief message to yourself (ie. Turn off light, lock the door) Use labels to indicate where things go (ie. Which cupboards are for food, dishes, etc.) Keep a notepad and pen by the telephone to take messages   3)  Memory Aids A diary or journal/notebook/daily planner Making a list (shopping list, chore list, to do list that needs to be done) Using an alarm as a reminder (kitchen timer or cell phone  alarm) Using cell phone to store information (Notes, Calendar, Reminders) Calendar/White board placed in a prominent position Post-it notes   In order for memory aids to be useful, you need to have good habits.  It's no good remembering to make a note in your journal if you don't remember to look in it.  Try setting aside a certain time of day to look in journal.   4)  Improving mood and managing fatigue. There may be other factors that contribute to memory difficulties.  Factors, such as anxiety, depression and tiredness can affect memory. Regular gentle exercise can help improve your mood and give you more energy. Exercise: there are short videos created by the [employer] on Health specially for older adults: https://bit.ly/2I30q97.  Mediterranean diet: which emphasizes fruits, vegetables, whole grains, legumes, fish, and other seafood; unsaturated fats such as olive oils; and low amounts of red meat, eggs, and sweets. A variation of this, called MIND (Mediterranean-DASH Intervention for Neurodegenerative Delay) incorporates the DASH (Dietary Approaches to Stop Hypertension) diet, which has been shown to lower high blood pressure, a risk factor for Alzheimer's disease. More information at: ExitMarketing.de.  Aerobic exercise that improve heart health is also good for the mind.  [employer] on Aging have short videos for exercises that you can do at home: BlindWorkshop.com.pt Simple relaxation techniques may help relieve symptoms  of anxiety Try to get back to completing activities or hobbies you enjoyed doing in the past. Learn to pace yourself through activities to decrease fatigue. Find out about some local support groups where you can share experiences with others. Try and achieve 7-8 hours of sleep at night.   Resources for Family/Caregiver  Online caregiver support groups can be found at WesternTunes.it  or call Alzheimer's Association's 24/7 hotline: (979)517-9108. [facility] offers in-person, virtual support groups and individual counseling for both care partners and persons with memory loss. Call for more information at [phone].   Advanced care plan: there are two types of Power of Attorney: healthcare and durable. Healthcare POA is a designated person to make healthcare decisions on your behalf if you were too sick to make them yourself. This person can be selected and documented by your physician. Durable POA has to be set up with a lawyer who takes charge of your finances and estate if you were too sick or cognitively impaired to manage your finances accurately. You can find a local Elder Therapist, art here: [place].at.  Check out www.planyourlifespan.org, which will help you plan before a crisis and decide who will take care of life considerations in a circumstance where you may not be able to speak for yourself.   Helpful books (available on [employer] or your local bookstore):  By Dr. Carl Best: Keeping Love Alive as Memories Fade: The 5 Love Languages and the Alzheimer's Journey Sep 28, 2015 The Dementia Care Partner's Workbook: A Guide for Understanding, Education, and [employer] - May 28, 2018.  Both available for less than $15.   "Coping with behavior change in dementia: a family caregiver's guide" by Ricardo Jericho & Valora Piccolo "A Caregiver's Guide to Dementia: Using Activities and Other Strategies to Prevent, Reduce and Manage Behavioral Symptoms" by Mahala Menghini Gitlin and [employer].  Youth worker of Joy for the Person with Alzheimer's or Dementia" 4th edition by Tama High  Caregiver videos on common behaviors related to dementia: PopulationGame.pl  [place] Caregiver Portal: free to sign up, links to local resources: [email or web address]-[email or web address]

## 2023-04-13 NOTE — Progress Notes (Unsigned)
No chief complaint on file.   HISTORY OF PRESENT ILLNESS:  04/13/23 ALL:  Alejandra Davis returns for follow up for AD. She continues memantine XR  daily.   10/05/2022 ALL: Alejandra Davis is a 87 y.o. female here today for follow up for AD. She was last seen by Dr Frances Furbish 02/2022 and memantine XR increased to  daily. Unable to tolerate donepezil in the past. She returns with her daughter, Alejandra Davis, who aids in history. Alejandra Davis reports some progression in short term memory loss. Alejandra Davis feels that she is doing fairly well. Alejandra Davis was hesitant to increase menantine dose. Alejandra Davis has continued  daily and tolerating well. She continues to live alone. She is independent in ADLs. She is able to manage medications without difficulty. She has a home care aide 10-5 W-F and 10-2 on M-T. Her daughter and son rotate every other weekend. She is sleeping well and appetite seems normal. She loves to do crossword and word finding puzzles. She was going to [facility] but hasn't been recently. She does not drive.   HISTORY (copied from Dr Teofilo Pod previous note)  Alejandra Davis is an 87 year old right-handed woman with an underlying medical history of diabetes, hypertension, branch retinal vein occlusion, macular edema, hearing loss, and hypothyroidism, who presents for follow-up consultation of her memory loss.  The patient is accompanied by her Aide, Alejandra Davis, today, and presents after a long gap of over 2 years.  I last saw her on 08/29/2019, at which time she was on Aricept 5 mg daily, had occasional GI complaints including stomach cramping and nausea, no vomiting or diarrhea.  She had weight loss.  She was advised to follow-up with her primary care to make sure there is no medical reason for her weight loss.  She was advised to increase her donepezil to 10 mg daily.  I had previously talked her about pursuing a sleep study but she did not wish to pursue this.   Her daughter called in the interim in September 2020 reporting that she  significant side effects from the donepezil and she was therefore advised to taper off of it.   The patient had a follow-up appointment with Dequandre Cordova, NP on 11/28/2019, at which time she was accompanied by her niece.  Her side effects had resolved after stopping the donepezil.  Her MMSE was 23 at the time.  The addition of Namenda was discussed but it was not started due to her family being concerned about side effects.    She did not return for follow-up since then.   She had an interim evaluation as per PCP request, with Dr. Clayborn Heron, neuropsychologist, in February 2022 and his impression was <<Diagnostic Impressions: Alzheimer's disease, late onset Cerebrovascular small vessel disease>>. She had a feedback appointment with Dr. Roseanne Reno in February 2022.   She was advised no longer to drive.   Today, 03/19/2022: She reports feeling okay, she is not able to provide much in the way of details of her history.  She denies any pain, any depression or stress.  She has not fallen.  Per Alejandra Davis she does her own laundry and also cooks some.  Alejandra Davis is there from 10 AM to 2 PM daily, Mondays through Fridays, there is another caretaker that is there from 4 PM to 7 PM daily, nobody stays with her overnight.  There have been no reports of falls, no wandering, no behavioral escalations, sometimes patient gets frustrated at times if she does not remember something.  No anger outbursts.  She tries to hydrate well, drinks 8 ounce bottles of water, typically 3 bottles well Alejandra Davis is there.  She has not driven a car in the months as far as her caretaker knows.  Patient reports that she drove her car to the grocery store in the afternoon some 2 weeks ago. This is not confirmed by the caretaker.  I reviewed her office visit note with her primary care on 11/18/2021.  She had blood work at the time which I reviewed: BUN was elevated at 28, creatinine elevated at 1.37.  She had a total cholesterol of 216, thyroid function  was benign, LDL 112.  She was started on memantine ER 7 mg strength in late November 2022 as I understand.  She seems to tolerate this well, her caretaker reports occasional diarrhea.   REVIEW OF SYSTEMS: Out of a complete 14 system review of symptoms, the patient complains only of the following symptoms,memory loss, and all other reviewed systems are negative.   ALLERGIES: Allergies  Allergen Reactions   Donepezil Hcl     Stomach cramps      HOME MEDICATIONS: Outpatient Medications Prior to Visit  Medication Sig Dispense Refill   amLODipine (NORVASC) 10 MG tablet TAKE 1 TABLET(10 MG) BY MOUTH DAILY 90 tablet 1   ASPIRIN LOW DOSE 81 MG EC tablet Take 81 mg by mouth daily.     diclofenac (VOLTAREN) 75 MG EC tablet Take 1 tablet (75 mg total) by mouth 2 (two) times daily. 20 tablet 0   dorzolamide-timolol (COSOPT) 22.3-6.8 MG/ML ophthalmic solution Place 1 drop into both eyes 2 (two) times daily.     levothyroxine (SYNTHROID) 75 MCG tablet Take 1 tablet (75 mcg total) by mouth daily. 90 tablet 1   lisinopril (ZESTRIL) 2.5 MG tablet Take 1 tablet (2.5 mg total) by mouth daily. 90 tablet 1   loratadine (CLARITIN) 10 MG tablet TAKE 1 TABLET(10 MG) BY MOUTH DAILY 90 tablet 1   memantine (NAMENDA XR) 7 MG CP24 24 hr capsule Take 1 capsule (7 mg total) by mouth daily. 90 capsule 3   metoprolol tartrate (LOPRESSOR) 50 MG tablet TAKE 1 TABLET(50 MG) BY MOUTH DAILY 90 tablet 1   No facility-administered medications prior to visit.     PAST MEDICAL HISTORY: Past Medical History:  Diagnosis Date   Chicken pox    Cystocele    Diabetes mellitus without complication (HCC)    Glaucoma    Hypertension    Hypertensive retinopathy    OU   Neuromuscular disorder (HCC)    Dementia    Thyroid disease    hypothyroidism     PAST SURGICAL HISTORY: Past Surgical History:  Procedure Laterality Date   ABDOMINAL HYSTERECTOMY  1990   TAH BSO   CATARACT EXTRACTION Bilateral    Dr. Alben Spittle    COLONOSCOPY WITH PROPOFOL N/A 04/08/2021   Procedure: COLONOSCOPY WITH PROPOFOL;  Surgeon: Charlott Rakes, MD;  Location: [facility];  Service: Endoscopy;  Laterality: N/A;   EYE SURGERY Bilateral    Cat Sx OU   OOPHORECTOMY     BSO   Vaginal Bx     Papilloma     FAMILY HISTORY: Family History  Problem Relation Age of Onset   Sickle cell anemia Other    Hypertension Mother    Cancer Father        LIVER   Heart disease Brother    Cancer Brother        STOMACH     SOCIAL HISTORY: Social  History   Socioeconomic History   Marital status: Divorced    Spouse name: Not on file   Number of children: Not on file   Years of education: Not on file   Highest education level: Not on file  Occupational History   Not on file  Tobacco Use   Smoking status: Never   Smokeless tobacco: Never  Vaping Use   Vaping Use: Never used  Substance and Sexual Activity   Alcohol use: No   Drug use: No   Sexual activity: Not Currently    Birth control/protection: Surgical  Other Topics Concern   Not on file  Social History Narrative   Social History      Diet? n/a      Do you drink/eat things with caffeine? Yes   Marital status?      Divorced                               What year were you married? 1962      Do you live in a house, apartment, assisted living, condo, trailer, etc.? house      Is it one or more stories? One story      How many persons live in your home? 1      Do you have any pets in your home? (please list) none       Highest level of education completed? 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               sometimes                       Type & how often? Walking       Advanced Directives      Do you have a living will? No       Do you have a DNR form?                                  If not, do you want to discuss one?   No       Do you have signed POA/HPOA for forms?       Functional Status      Do you have difficulty bathing or dressing yourself? No       Do you have difficulty preparing food or eating? No       Do you have difficulty managing your medications? No       Do you have difficulty managing your finances? No       Do you have difficulty affording your medications? No       Social Determinants of Corporate investment banker Strain: Not on file  Food Insecurity: Not on file  Transportation Needs: Not on file  Physical Activity: Not on file  Stress: Not on file  Social Connections: Not on file  Intimate Partner Violence: Not on file     PHYSICAL EXAM  There were no vitals filed for this visit.  There is no height or weight on file to calculate BMI.  Generalized: Well developed, in no acute distress  Cardiology: normal rate and rhythm, no murmur auscultated  Respiratory: clear to auscultation bilaterally  Neurological examination  Mentation: Alert, not fully oriented to time but is oriented to place and most history taking. Follows all commands speech and language fluent Cranial nerve II-XII: Pupils were equal round reactive to light. Extraocular movements were full, visual field were full on confrontational test. Facial sensation and strength were normal. Head turning and shoulder shrug  were normal and symmetric. Motor: The motor testing reveals 5 over 5 strength of bilateral upper ext, 4+/5 bilateral lowers. Good symmetric motor tone is noted throughout.  Sensory: Sensory testing is intact to soft touch on all 4 extremities. No evidence of extinction is noted.  Coordination: Cerebellar testing reveals good finger-nose-finger and heel-to-shin bilaterally.  Gait and station: Gait is normal.    DIAGNOSTIC DATA (LABS, IMAGING, TESTING) - I reviewed patient records, labs, notes, testing and imaging myself where available.  Lab Results  Component Value Date   WBC 6.6  11/06/2022   HGB 13.9 11/06/2022   HCT 43.2 11/06/2022   MCV 81.5 11/06/2022   PLT 261 11/06/2022      Component Value Date/Time   NA 142 11/06/2022 1520   K 3.9 11/06/2022 1520   CL 104 11/06/2022 1520   CO2 24 11/06/2022 1520   GLUCOSE 85 11/06/2022 1520   BUN 24 (H) 11/06/2022 1520   CREATININE 1.29 (H) 11/06/2022 1520   CREATININE 1.52 (H) 07/21/2021 0808   CALCIUM 10.5 (H) 11/06/2022 1520   PROT 7.4 07/22/2021 1932   ALBUMIN 3.8 07/22/2021 1932   AST 20 07/22/2021 1932   ALT 13 07/22/2021 1932   ALKPHOS 47 07/22/2021 1932   BILITOT 0.2 (L) 07/22/2021 1932   GFRNONAA 41 (L) 11/06/2022 1520   GFRNONAA 33 (L) 04/22/2021 1056   GFRAA 38 (L) 04/22/2021 1056   Lab Results  Component Value Date   CHOL 188 07/21/2021   HDL 74 07/21/2021   LDLCALC 97 07/21/2021   TRIG 80 07/21/2021   CHOLHDL 2.5 07/21/2021   Lab Results  Component Value Date   HGBA1C 5.9 (H) 07/21/2021   No results found for: "VITAMINB12" Lab Results  Component Value Date   TSH 1.566 07/22/2021       10/05/2022    9:15 AM 03/19/2022   10:50 AM 01/29/2021   11:00 AM  MMSE - Mini Mental State Exam  Orientation to time 2 0 0  Orientation to Place Registration Attention/ Calculation Recall 0 0 0  Language- name 2 objects Language- repeat Language- follow 3 step command Language- read & follow direction Write a sentence Copy design 1 1 0  Total score No data to display           ASSESSMENT AND PLAN  87 y.o. year old female  has a past medical history of Chicken pox, Cystocele, Diabetes mellitus without complication (HCC), Glaucoma, Hypertension, Hypertensive retinopathy, Neuromuscular disorder (HCC), and Thyroid disease. here with    No diagnosis found.  ARIHANNA ESTABROOK is doing fairly well, today. Her daughter has noted some progression of short term memory loss. She remains independent with ADLs but does have a  health care aid that comes regularly and her children check on her on the weekends. We have discussed option of increasing memantine, possible side effects and expectations for therapy moving forward.  She agrees to increase memantine XR to 14mg  daily. She will call for any concerns. Memory compensation strategies reviewed. Resources provided to her daughter for community support. Healthy lifestyle habits encouraged. She will follow up with PCP as directed. She will return to see me in 6 months, sooner if needed. She verbalizes understanding and agreement with this plan.   No orders of the defined types were placed in this encounter.    No orders of the defined types were placed in this encounter.   I spent 30 minutes of face-to-face and non-face-to-face time with patient.  This included previsit chart review, lab review, study review, order entry, electronic health record documentation, patient education.   Shawnie Dapper, MSN, FNP-C 04/13/2023, 11:47 AM  [facility] [address], [address] [place], [place] [address] [phone]

## 2023-04-14 ENCOUNTER — Ambulatory Visit: Payer: Medicare PPO | Admitting: Family Medicine

## 2023-04-14 ENCOUNTER — Encounter: Payer: Self-pay | Admitting: Family Medicine

## 2023-04-14 VITALS — BP 129/75 | HR 74 | Ht 65.0 in | Wt 119.5 lb

## 2023-04-14 DIAGNOSIS — G301 Alzheimer's disease with late onset: Secondary | ICD-10-CM | POA: Diagnosis not present

## 2023-04-14 DIAGNOSIS — F0280 Dementia in other diseases classified elsewhere without behavioral disturbance: Secondary | ICD-10-CM

## 2023-04-27 DIAGNOSIS — H401133 Primary open-angle glaucoma, bilateral, severe stage: Secondary | ICD-10-CM | POA: Diagnosis not present

## 2023-04-29 NOTE — Progress Notes (Addendum)
[facility] - Clinic Note  05/05/2023    CHIEF COMPLAINT Patient presents for Retina Follow Up  HISTORY OF PRESENT ILLNESS: Alejandra Davis is a 87 y.o. female who presents to the clinic today for:   HPI     Retina Follow Up   Patient presents with  CRVO/BRVO.  In right eye.  This started 6 weeks ago.  I, the attending physician,  performed the HPI with the patient and updated documentation appropriately.        Comments   Patient here for 6 weeks retina follow up for BRVO OD. Patient states vision is ok. About the same. No eye pain.       Last edited by Rennis Chris, MD on 05/05/2023 11:48 AM.    Patient states that the vision is blurry at times. Her health is stable.   Referring physician: Diona Foley, MD [address] [place],  [place] [address]  HISTORICAL INFORMATION:   Selected notes from the MEDICAL RECORD NUMBER Referred by Dr. Joylene Igo for DM exam   CURRENT MEDICATIONS: Current Outpatient Medications (Ophthalmic Drugs)  Medication Sig   dorzolamide-timolol (COSOPT) 22.3-6.8 MG/ML ophthalmic solution Place 1 drop into both eyes 2 (two) times daily.   No current facility-administered medications for this visit. (Ophthalmic Drugs)   Current Outpatient Medications (Other)  Medication Sig   amLODipine (NORVASC) 10 MG tablet TAKE 1 TABLET(10 MG) BY MOUTH DAILY   ASPIRIN LOW DOSE 81 MG EC tablet Take 81 mg by mouth daily.   levothyroxine (SYNTHROID) 75 MCG tablet Take 1 tablet (75 mcg total) by mouth daily.   lisinopril (ZESTRIL) 2.5 MG tablet Take 1 tablet (2.5 mg total) by mouth daily.   loratadine (CLARITIN) 10 MG tablet TAKE 1 TABLET(10 MG) BY MOUTH DAILY   memantine (NAMENDA XR) 7 MG CP24 24 hr capsule Take 1 capsule (7 mg total) by mouth daily.   metoprolol tartrate (LOPRESSOR) 50 MG tablet TAKE 1 TABLET(50 MG) BY MOUTH DAILY   No current facility-administered medications for this visit. (Other)   REVIEW OF SYSTEMS: ROS    Positive for: Skin, Genitourinary, Musculoskeletal, Endocrine, Cardiovascular, Eyes Negative for: Constitutional, Gastrointestinal, Neurological, HENT, Respiratory, Psychiatric, Allergic/Imm, Heme/Lymph Last edited by Laddie Aquas, COA on 05/05/2023  9:47 AM.     ALLERGIES Allergies  Allergen Reactions   Donepezil Hcl     Stomach cramps    PAST MEDICAL HISTORY Past Medical History:  Diagnosis Date   Chicken pox    Cystocele    Diabetes mellitus without complication (HCC)    Glaucoma    Hypertension    Hypertensive retinopathy    OU   Neuromuscular disorder (HCC)    Dementia    Thyroid disease    hypothyroidism   Past Surgical History:  Procedure Laterality Date   ABDOMINAL HYSTERECTOMY  1990   TAH BSO   CATARACT EXTRACTION Bilateral    Dr. Alben Spittle   COLONOSCOPY WITH PROPOFOL N/A 04/08/2021   Procedure: COLONOSCOPY WITH PROPOFOL;  Surgeon: Charlott Rakes, MD;  Location: [facility];  Service: Endoscopy;  Laterality: N/A;   EYE SURGERY Bilateral    Cat Sx OU   OOPHORECTOMY     BSO   Vaginal Bx     Papilloma   FAMILY HISTORY Family History  Problem Relation Age of Onset   Sickle cell anemia Other    Hypertension Mother    Cancer Father        LIVER   Heart disease  Brother    Cancer Brother        STOMACH   SOCIAL HISTORY Social History   Tobacco Use   Smoking status: Never   Smokeless tobacco: Never  Vaping Use   Vaping Use: Never used  Substance Use Topics   Alcohol use: No   Drug use: No       OPHTHALMIC EXAM: Base Eye Exam     Visual Acuity (Snellen - Linear)       Right Left   Dist cc 20/150 -2 20/70 -2   Dist ph cc 20/150 -1 20/40    Correction: Glasses         Tonometry (Tonopen, 10:37 AM)       Right Left   Pressure 14 11         Pupils       Dark Light Shape React APD   Right 3 2 Round Brisk None   Left              Visual Fields (Counting fingers)       Left Right    Full    Restrictions  Partial outer  superior temporal, inferior temporal deficiencies         Extraocular Movement       Right Left    Full, Ortho Full, Ortho         Neuro/Psych     Oriented x3: Yes   Mood/Affect: Normal         Dilation     Both eyes: 1.0% Mydriacyl, 2.5% Phenylephrine @ 9:45 AM           Slit Lamp and Fundus Exam     Slit Lamp Exam       Right Left   Lids/Lashes Dermatochalasis - upper lid, Meibomian gland dysfunction Dermatochalasis - upper lid, Meibomian gland dysfunction   Conjunctiva/Sclera Melanosis Melanosis   Cornea Arcus, trace Punctate epithelial erosions, tear film debris, 1+ [place], Well healed temporal cataract wound Arcus, Inferior 1+ Punctate epithelial erosions, well healed cataract wound   Anterior Chamber Deep and quiet Deep;  no cell or flare   Iris Round and dilated, No NVI Round and dilated, No NVI   Lens PC IOL in good position, 2+ Posterior capsular opacification Posterior chamber intraocular lens in good position, 1-2+ PCO.   Anterior Vitreous Vitreous syneresis, vitreous condensations, silicone oil bubbles Vitreous syneresis, Vitreous condensations         Fundus Exam       Right Left   Disc Sharp rim, inferior notch, 3-4+ pallor, inf. Rim thinning, attenuated vessels superiorly, +disc heme at 1100 -- resolved, fine vascular loops superiorly 2+ pallor, Sharp rim, thin inferior rim   C/D Ratio 0.85 0.75   Macula Blunted foveal reflex, persistent cystic changes superior mac; stable improvement in DBH superior mac, central exudates, fibrosis and atrophy -- stable, no frank heme Flat, good foveal reflex, mild RPE mottling and clumping, Epiretinal membrane, No heme or edema   Vessels superior BRVO with severe attenuation of ST arterioles, Tortuous attenuated, Tortuous   Periphery Attached, DBH superior hemisphere extended from BRVO- resolved Attached, mild scattered Reticular degeneration, No heme           IMAGING AND PROCEDURES  Imaging and  Procedures for 03/07/18  OCT, Retina - OU - Both Eyes       Right Eye Quality was good. Central Foveal Thickness: 271. Progression has been stable. Findings include no SRF, abnormal foveal contour, subretinal hyper-reflective material,  intraretinal hyper-reflective material, epiretinal membrane, intraretinal fluid, outer retinal atrophy, vitreomacular adhesion (Persistent cystic changes, central SRHM and patchy ORA; diffuse retinal thinning -- stable ).   Left Eye Quality was good. Central Foveal Thickness: 265. Progression has been stable. Findings include no IRF, no SRF, abnormal foveal contour, epiretinal membrane, vitreomacular adhesion (Blunted foveal depression -- stable).   Notes *Images captured and stored on drive  Diagnosis / Impression:  ERM OU OD: BRVO w/ CME -- Persistent cystic changes; stable central SRHM and patchy ORA; diffuse retinal thinning -- stable OS: very mild ERM with blunted foveal depression -- stable; mild VMA  Clinical management:  See below  Abbreviations: NFP - Normal foveal profile. CME - cystoid macular edema. PED - pigment epithelial detachment. IRF - intraretinal fluid. SRF - subretinal fluid. EZ - ellipsoid zone. ERM - epiretinal membrane. ORA - outer retinal atrophy. ORT - outer retinal tubulation. SRHM - subretinal hyper-reflective material       Intravitreal Injection, Pharmacologic Agent - OD - Right Eye       Time Out 05/05/2023. 10:49 AM. Confirmed correct patient, procedure, site, and patient consented.   Anesthesia Topical anesthesia was used. Anesthetic medications included Lidocaine 2%, Proparacaine 0.5%.   Procedure Preparation included 5% betadine to ocular surface, eyelid speculum. A (32g) needle was used.   Injection: 2 mg aflibercept 2 MG/0.05ML   Route: Intravitreal, Site: Right Eye   NDC: L6038910, Lot: [id number], Expiration date: 04/24/2024, Waste: 0 mL   Post-op Post injection exam found visual acuity of at least  counting fingers. The patient tolerated the procedure well. There were no complications. The patient received written and verbal post procedure care education. Post injection medications were not given.            ASSESSMENT/PLAN:   ICD-10-CM   1. Branch retinal vein occlusion of right eye with macular edema  H34.8310 OCT, Retina - OU - Both Eyes    Intravitreal Injection, Pharmacologic Agent - OD - Right Eye    aflibercept (EYLEA) SOLN 2 mg    2. Diabetes mellitus type 2 without retinopathy (HCC)  E11.9     3. Epiretinal membrane (ERM) of both eyes  H35.373     4. Pseudophakia of both eyes  Z96.1     5. Ocular hypertension of right eye  H40.051     6. Glaucoma suspect of both eyes  H40.003     7. Essential hypertension  I10     8. Hypertensive retinopathy of both eyes  H35.033      1. BRVO with CME OD - lost to follow up from 02.22.22 to 11.1.22 -- re-presented with massive CME and BCVA 20/400 from 20/30  - initial OCT w/ CME superior macula - s/p IVA OD #1 (10.18.19), #2 (11.19.19), #3 (12.17.19), #4 (02.19.20), #5 (03.23.20), #6 (04.21.20), #7 (05.27.20), #8 (07.07.20), #9 (08.25.20), #10 (9.29.20), #11 (11.17.20), #12 (12.22.20), #13 (01.27.21), #14 (03.10.21), #15 (04.21.21), #16 (05.26.21), #17 (06.30.21), #18 (08.04.21), #19 (09.01.21), #20 (10.06.21), #21 (11.17.21), #22 (12.30.21), #23 (2.22.22), #24 (11.1.22), #25 (11.29.22), #26 (01.03.23), #27 (02.14.23), #28 (04.07.23), #29 (05.19.23), #30 (06.20.23) -- IVA resistance  **history of worsening IRF/cystic changes at 6+ weeks interval, IVA** - s/p IVE OD #1 (08.11.23), #2 (09.08.23), #3 (10.06.23), #4 (11.06.23), #5 (12.13.23), #6 (01.24.24), #7 (03.27.24)  - BCVA OD 20/150 from CF - OCT shows OD: Persistent cystic changes; stable central SRHM and patchy ORA; diffuse retinal thinning at 6 weeks  - recommend IVE OD #8 today, 05.08.24, w/  f/u in 6 wks.   - RBA of procedure discussed, questions answered - informed consent  obtained and signed - see procedure note   - Avastin informed consent form re-signed and scanned on 05.19.23  - Eylea informed consent form signed and scanned on 08.11.23  - f/u in 6 weeks -- DFE/OCT, possible injection   2. Diabetes mellitus, type 2 without retinopathy - The incidence, risk factors for progression, natural history and treatment options for diabetic retinopathy were discussed with patient.   - The need for close monitoring of blood glucose, blood pressure, and serum lipids, avoiding cigarette or any type of tobacco, and the need for long term follow up was also discussed with patient.    3. Epiretinal membrane, OU  - relatively mild ERM OU -- blunted central foveal depression - OD with mild cystic changes 2/2 BRVO as above; OS without cystic changes or edema  - discussed findings and prognosis   - continue to monitor  4. Pseudophakia OU  - s/p CE/IOL OS 11.13.19 w/ Dr. Alben Spittle  - s/p CE/IOL OD 01.16.20 w/ Dr. Alben Spittle  - beautiful surgeries -- IOLs in perfect position  - healing well post-operatively - IRF/CME OD may have been partly due to post op CME ([place]) in addition to BRVO  5,6. Ocular hypertension / Glaucoma suspect OD>OS  - IOP OD 14, OS 11  - denies any family hx of Glaucoma   - under the expert management of Dr. Zenaida Niece  - most recent notes reviewed (04.30.24 visit w/ Dr. Zenaida Niece)  - on Cosopt BID OU and Brimonidine TID OU per Dr. Zenaida Niece  7,8. Hypertensive retinopathy OU - pt presented to 6.24.2020 visit urgently for right sided headache above right eye with extension to occiput - BP at that time was 201/100 -- pt was sent to primary care clinic for urgent evaluation and meds were adjusted  - BP now under better control and headaches improved  - BP reading 11.1.22 -- 180/84 -- advised f/u with PCP  - discussed importance of tight BP control  Ophthalmic Meds Ordered this visit:  Meds ordered this encounter  Medications   aflibercept (EYLEA) SOLN 2 mg      Return in about 6 weeks (around 06/16/2023) for f/u BRVO OD , DFE, OCT, Possible, IVE, OD.  There are no Patient Instructions on file for this visit.  This document serves as a record of services personally performed by Karie Chimera, MD, PhD. It was created on their behalf by De Blanch, an ophthalmic technician. The creation of this record is the provider's dictation and/or activities during the visit.    Electronically signed by: De Blanch, OA, 05/05/23  12:33 PM  This document serves as a record of services personally performed by Karie Chimera, MD, PhD. It was created on their behalf by Gerilyn Nestle, COT an ophthalmic technician. The creation of this record is the provider's dictation and/or activities during the visit.    Electronically signed by:  Gerilyn Nestle, COT  05.08.24 12:33 PM  Karie Chimera, M.D., Ph.D. Diseases & Surgery of the Retina and Vitreous [facility] [place]  I have reviewed the above documentation for accuracy and completeness, and I agree with the above. Karie Chimera, M.D., Ph.D. 05/05/23 12:33 PM   Abbreviations: M myopia (nearsighted); A astigmatism; H hyperopia (farsighted); P presbyopia; Mrx spectacle prescription;  CTL contact lenses; OD right eye; OS left eye; OU both eyes  XT exotropia; ET esotropia; PEK punctate  epithelial keratitis; PEE punctate epithelial erosions; DES dry eye syndrome; MGD meibomian gland dysfunction; ATs artificial tears; PFAT's preservative free artificial tears; NSC nuclear sclerotic cataract; PSC posterior subcapsular cataract; ERM epi-retinal membrane; PVD posterior vitreous detachment; RD retinal detachment; DM diabetes mellitus; DR diabetic retinopathy; NPDR non-proliferative diabetic retinopathy; PDR proliferative diabetic retinopathy; CSME clinically significant macular edema; DME diabetic macular edema; dbh dot blot hemorrhages; CWS cotton wool spot; POAG primary open angle  glaucoma; C/D cup-to-disc ratio; HVF humphrey visual field; GVF goldmann visual field; OCT optical coherence tomography; IOP intraocular pressure; BRVO Branch retinal vein occlusion; CRVO central retinal vein occlusion; CRAO central retinal artery occlusion; BRAO branch retinal artery occlusion; RT retinal tear; SB scleral buckle; PPV pars plana vitrectomy; VH Vitreous hemorrhage; PRP panretinal laser photocoagulation; IVK intravitreal kenalog; VMT vitreomacular traction; MH Macular hole;  NVD neovascularization of the disc; NVE neovascularization elsewhere; AREDS age related eye disease study; ARMD age related macular degeneration; POAG primary open angle glaucoma; EBMD epithelial/anterior basement membrane dystrophy; ACIOL anterior chamber intraocular lens; IOL intraocular lens; PCIOL posterior chamber intraocular lens; Phaco/IOL phacoemulsification with intraocular lens placement; PRK photorefractive keratectomy; LASIK laser assisted in situ keratomileusis; HTN hypertension; DM diabetes mellitus; COPD chronic obstructive pulmonary disease

## 2023-05-05 ENCOUNTER — Ambulatory Visit (INDEPENDENT_AMBULATORY_CARE_PROVIDER_SITE_OTHER): Payer: Medicare PPO | Admitting: Ophthalmology

## 2023-05-05 ENCOUNTER — Encounter (INDEPENDENT_AMBULATORY_CARE_PROVIDER_SITE_OTHER): Payer: Self-pay | Admitting: Ophthalmology

## 2023-05-05 DIAGNOSIS — I10 Essential (primary) hypertension: Secondary | ICD-10-CM | POA: Diagnosis not present

## 2023-05-05 DIAGNOSIS — E119 Type 2 diabetes mellitus without complications: Secondary | ICD-10-CM

## 2023-05-05 DIAGNOSIS — H40051 Ocular hypertension, right eye: Secondary | ICD-10-CM

## 2023-05-05 DIAGNOSIS — H35033 Hypertensive retinopathy, bilateral: Secondary | ICD-10-CM | POA: Diagnosis not present

## 2023-05-05 DIAGNOSIS — Z961 Presence of intraocular lens: Secondary | ICD-10-CM

## 2023-05-05 DIAGNOSIS — H34831 Tributary (branch) retinal vein occlusion, right eye, with macular edema: Secondary | ICD-10-CM

## 2023-05-05 DIAGNOSIS — H40003 Preglaucoma, unspecified, bilateral: Secondary | ICD-10-CM | POA: Diagnosis not present

## 2023-05-05 DIAGNOSIS — H35373 Puckering of macula, bilateral: Secondary | ICD-10-CM

## 2023-05-05 MED ORDER — AFLIBERCEPT 2MG/0.05ML IZ SOLN FOR KALEIDOSCOPE
2.0000 mg | INTRAVITREAL | Status: AC | PRN
  Administered 2023-05-05: 2 mg via INTRAVITREAL

## 2023-05-17 DIAGNOSIS — I10 Essential (primary) hypertension: Secondary | ICD-10-CM | POA: Diagnosis not present

## 2023-05-17 DIAGNOSIS — I700 Atherosclerosis of aorta: Secondary | ICD-10-CM | POA: Diagnosis not present

## 2023-05-17 DIAGNOSIS — F0280 Dementia in other diseases classified elsewhere without behavioral disturbance: Secondary | ICD-10-CM | POA: Diagnosis not present

## 2023-05-17 DIAGNOSIS — E1122 Type 2 diabetes mellitus with diabetic chronic kidney disease: Secondary | ICD-10-CM | POA: Diagnosis not present

## 2023-05-17 DIAGNOSIS — E039 Hypothyroidism, unspecified: Secondary | ICD-10-CM | POA: Diagnosis not present

## 2023-05-17 DIAGNOSIS — R42 Dizziness and giddiness: Secondary | ICD-10-CM | POA: Diagnosis not present

## 2023-05-17 DIAGNOSIS — N1831 Chronic kidney disease, stage 3a: Secondary | ICD-10-CM | POA: Diagnosis not present

## 2023-05-17 DIAGNOSIS — E1149 Type 2 diabetes mellitus with other diabetic neurological complication: Secondary | ICD-10-CM | POA: Diagnosis not present

## 2023-06-16 ENCOUNTER — Encounter (INDEPENDENT_AMBULATORY_CARE_PROVIDER_SITE_OTHER): Payer: Self-pay | Admitting: Ophthalmology

## 2023-06-16 ENCOUNTER — Ambulatory Visit (INDEPENDENT_AMBULATORY_CARE_PROVIDER_SITE_OTHER): Payer: Medicare PPO | Admitting: Ophthalmology

## 2023-06-16 DIAGNOSIS — H35033 Hypertensive retinopathy, bilateral: Secondary | ICD-10-CM

## 2023-06-16 DIAGNOSIS — H40051 Ocular hypertension, right eye: Secondary | ICD-10-CM

## 2023-06-16 DIAGNOSIS — H40003 Preglaucoma, unspecified, bilateral: Secondary | ICD-10-CM

## 2023-06-16 DIAGNOSIS — H34831 Tributary (branch) retinal vein occlusion, right eye, with macular edema: Secondary | ICD-10-CM | POA: Diagnosis not present

## 2023-06-16 DIAGNOSIS — I10 Essential (primary) hypertension: Secondary | ICD-10-CM | POA: Diagnosis not present

## 2023-06-16 DIAGNOSIS — H35373 Puckering of macula, bilateral: Secondary | ICD-10-CM

## 2023-06-16 DIAGNOSIS — Z961 Presence of intraocular lens: Secondary | ICD-10-CM

## 2023-06-16 DIAGNOSIS — E119 Type 2 diabetes mellitus without complications: Secondary | ICD-10-CM

## 2023-06-16 MED ORDER — AFLIBERCEPT 2MG/0.05ML IZ SOLN FOR KALEIDOSCOPE
2.0000 mg | INTRAVITREAL | Status: AC | PRN
  Administered 2023-06-16: 2 mg via INTRAVITREAL

## 2023-06-16 NOTE — Progress Notes (Signed)
[facility] - Clinic Note  06/16/2023    CHIEF COMPLAINT Patient presents for Retina Follow Up  HISTORY OF PRESENT ILLNESS: Alejandra Davis is a 87 y.o. female who presents to the clinic today for:   HPI     Retina Follow Up   Patient presents with  CRVO/BRVO.  In right eye.  This started months ago.  Duration of 6 weeks.  I, the attending physician,  performed the HPI with the patient and updated documentation appropriately.        Comments   Patient states that the right eye vision is a little blurry. She is using Cosopt OU BID. She does not check her blood sugar.       Last edited by Rennis Chris, MD on 06/16/2023  4:41 PM.    Patient states that the vision is blurry at times. Her health is stable.   Referring physician: Moshe Cipro, NP [address] [place],  [place] [address]  HISTORICAL INFORMATION:   Selected notes from the MEDICAL RECORD NUMBER Referred by Dr. Joylene Igo for DM exam   CURRENT MEDICATIONS: Current Outpatient Medications (Ophthalmic Drugs)  Medication Sig   dorzolamide-timolol (COSOPT) 22.3-6.8 MG/ML ophthalmic solution Place 1 drop into both eyes 2 (two) times daily.   No current facility-administered medications for this visit. (Ophthalmic Drugs)   Current Outpatient Medications (Other)  Medication Sig   amLODipine (NORVASC) 10 MG tablet TAKE 1 TABLET(10 MG) BY MOUTH DAILY   ASPIRIN LOW DOSE 81 MG EC tablet Take 81 mg by mouth daily.   levothyroxine (SYNTHROID) 75 MCG tablet Take 1 tablet (75 mcg total) by mouth daily.   lisinopril (ZESTRIL) 2.5 MG tablet Take 1 tablet (2.5 mg total) by mouth daily.   loratadine (CLARITIN) 10 MG tablet TAKE 1 TABLET(10 MG) BY MOUTH DAILY   memantine (NAMENDA XR) 7 MG CP24 24 hr capsule Take 1 capsule (7 mg total) by mouth daily.   metoprolol tartrate (LOPRESSOR) 50 MG tablet TAKE 1 TABLET(50 MG) BY MOUTH DAILY   No current facility-administered medications for this visit. (Other)    REVIEW OF SYSTEMS: ROS   Positive for: Skin, Genitourinary, Musculoskeletal, Endocrine, Cardiovascular, Eyes Negative for: Constitutional, Gastrointestinal, Neurological, HENT, Respiratory, Psychiatric, Allergic/Imm, Heme/Lymph Last edited by Charlette Caffey, COT on 06/16/2023  9:32 AM.     ALLERGIES Allergies  Allergen Reactions   Donepezil Hcl     Stomach cramps    PAST MEDICAL HISTORY Past Medical History:  Diagnosis Date   Chicken pox    Cystocele    Diabetes mellitus without complication (HCC)    Glaucoma    Hypertension    Hypertensive retinopathy    OU   Neuromuscular disorder (HCC)    Dementia    Thyroid disease    hypothyroidism   Past Surgical History:  Procedure Laterality Date   ABDOMINAL HYSTERECTOMY  1990   TAH BSO   CATARACT EXTRACTION Bilateral    Dr. Alben Spittle   COLONOSCOPY WITH PROPOFOL N/A 04/08/2021   Procedure: COLONOSCOPY WITH PROPOFOL;  Surgeon: Charlott Rakes, MD;  Location: [facility];  Service: Endoscopy;  Laterality: N/A;   EYE SURGERY Bilateral    Cat Sx OU   OOPHORECTOMY     BSO   Vaginal Bx     Papilloma   FAMILY HISTORY Family History  Problem Relation Age of Onset   Sickle cell anemia Other    Hypertension Mother    Cancer Father  LIVER   Heart disease Brother    Cancer Brother        STOMACH   SOCIAL HISTORY Social History   Tobacco Use   Smoking status: Never   Smokeless tobacco: Never  Vaping Use   Vaping Use: Never used  Substance Use Topics   Alcohol use: No   Drug use: No       OPHTHALMIC EXAM: Base Eye Exam     Visual Acuity (Snellen - Linear)       Right Left   Dist cc 20/200+1 20/80 +1   Dist ph cc NI 20/40    Correction: Glasses         Tonometry (Tonopen, 9:38 AM)       Right Left   Pressure 13 13         Pupils       Dark Light Shape React APD   Right 3 2 Round Brisk None   Left 3 2 Round Brisk None         Visual Fields       Left Right    Full     Restrictions  Partial outer superior temporal, inferior temporal deficiencies         Extraocular Movement       Right Left    Full, Ortho Full, Ortho         Neuro/Psych     Oriented x3: Yes   Mood/Affect: Normal         Dilation     Both eyes: 1.0% Mydriacyl, 2.5% Phenylephrine @ 9:34 AM           Slit Lamp and Fundus Exam     Slit Lamp Exam       Right Left   Lids/Lashes Dermatochalasis - upper lid, Meibomian gland dysfunction Dermatochalasis - upper lid, Meibomian gland dysfunction   Conjunctiva/Sclera Melanosis Melanosis   Cornea Arcus, trace Punctate epithelial erosions, tear film debris, 1+ [place]utt[place], Well healed temporal cataract wound Arcus, Inferior 1+ Punctate epithelial erosions, well healed cataract wound   Anterior Chamber Deep and quiet Deep;  no cell or flare   Iris Round and dilated, No NVI Round and dilated, No NVI   Lens PC IOL in good position, 2+ Posterior capsular opacification Posterior chamber intraocular lens in good position, 1-2+ PCO.   Anterior Vitreous Vitreous syneresis, vitreous condensations, silicone oil bubbles Vitreous syneresis, Vitreous condensations         Fundus Exam       Right Left   Disc Sharp rim, inferior notch, 3-4+ pallor, inf. Rim thinning, attenuated vessels superiorly, +disc heme at 1100 -- resolved, fine vascular loops superiorly 2+ pallor, Sharp rim, thin inferior rim   C/D Ratio 0.85 0.75   Macula Blunted foveal reflex, persistent cystic changes superior mac; stable improvement in DBH superior mac, central exudates, fibrosis and atrophy -- stable, no frank heme Flat, good foveal reflex, mild RPE mottling and clumping, Epiretinal membrane, No heme or edema   Vessels superior BRVO with severe attenuation of ST arterioles, Tortuous attenuated, Tortuous   Periphery Attached, DBH superior hemisphere extended from BRVO- resolved Attached, mild scattered Reticular degeneration, No heme           Refraction      Wearing Rx       Sphere Cylinder Axis Add   Right -0.75 +1.25 170 +2.50   Left -0.75 +1.00 003 +2.50    Type: PAL         Wearing  Rx #2       Sphere Cylinder Axis Add   Right -0.75 +1.25 170 +2.50   Left -0.75 +1.00 003 +2.50    Type: PAL           IMAGING AND PROCEDURES  Imaging and Procedures for 03/07/18  OCT, Retina - OU - Both Eyes       Right Eye Quality was good. Central Foveal Thickness: 294. Progression has been stable. Findings include no SRF, abnormal foveal contour, subretinal hyper-reflective material, intraretinal hyper-reflective material, epiretinal membrane, intraretinal fluid, outer retinal atrophy, vitreomacular adhesion (Persistent cystic changes, central SRHM and patchy ORA; diffuse retinal thinning -- stable ).   Left Eye Quality was good. Central Foveal Thickness: 252. Progression has been stable. Findings include no IRF, no SRF, abnormal foveal contour, epiretinal membrane, vitreomacular adhesion (Blunted foveal depression -- stable).   Notes *Images captured and stored on drive  Diagnosis / Impression:  ERM OU OD: BRVO w/ CME -- Persistent cystic changes; stable central SRHM and patchy ORA; diffuse retinal thinning -- stable OS: very mild ERM with blunted foveal depression -- stable; mild VMA  Clinical management:  See below  Abbreviations: NFP - Normal foveal profile. CME - cystoid macular edema. PED - pigment epithelial detachment. IRF - intraretinal fluid. SRF - subretinal fluid. EZ - ellipsoid zone. ERM - epiretinal membrane. ORA - outer retinal atrophy. ORT - outer retinal tubulation. SRHM - subretinal hyper-reflective material       Intravitreal Injection, Pharmacologic Agent - OD - Right Eye       Time Out 06/16/2023. 10:58 AM. Confirmed correct patient, procedure, site, and patient consented.   Anesthesia Topical anesthesia was used. Anesthetic medications included Lidocaine 2%, Proparacaine 0.5%.   Procedure Preparation  included 5% betadine to ocular surface, eyelid speculum. A (32g) needle was used.   Injection: 2 mg aflibercept 2 MG/0.05ML   Route: Intravitreal, Site: Right Eye   NDC: L6038910, Lot: [id number], Expiration date: 05/27/2024, Waste: 0 mL   Post-op Post injection exam found visual acuity of at least counting fingers. The patient tolerated the procedure well. There were no complications. The patient received written and verbal post procedure care education. Post injection medications were not given.            ASSESSMENT/PLAN:   ICD-10-CM   1. Branch retinal vein occlusion of right eye with macular edema  H34.8310 OCT, Retina - OU - Both Eyes    Intravitreal Injection, Pharmacologic Agent - OD - Right Eye    aflibercept (EYLEA) SOLN 2 mg    2. Diabetes mellitus type 2 without retinopathy (HCC)  E11.9     3. Epiretinal membrane (ERM) of both eyes  H35.373     4. Pseudophakia of both eyes  Z96.1     5. Ocular hypertension of right eye  H40.051     6. Glaucoma suspect of both eyes  H40.003     7. Essential hypertension  I10     8. Hypertensive retinopathy of both eyes  H35.033      1. BRVO with CME OD - lost to follow up from 02.22.22 to 11.1.22 -- re-presented with massive CME and BCVA 20/400 from 20/30  - initial OCT w/ CME superior macula - s/p IVA OD #1 (10.18.19), #2 (11.19.19), #3 (12.17.19), #4 (02.19.20), #5 (03.23.20), #6 (04.21.20), #7 (05.27.20), #8 (07.07.20), #9 (08.25.20), #10 (9.29.20), #11 (11.17.20), #12 (12.22.20), #13 (01.27.21), #14 (03.10.21), #15 (04.21.21), #16 (05.26.21), #17 (06.30.21), #18 (08.04.21), #19 (09.01.21), #20 (10.06.21), #  21 (11.17.21), #22 (12.30.21), #23 (2.22.22), #24 (11.1.22), #25 (11.29.22), #26 (01.03.23), #27 (02.14.23), #28 (04.07.23), #29 (05.19.23), #30 (06.20.23) -- IVA resistance  **history of worsening IRF/cystic changes at 6+ weeks interval, IVA** - s/p IVE OD #1 (08.11.23), #2 (09.08.23), #3 (10.06.23), #4 (11.06.23), #5  (12.13.23), #6 (01.24.24), #7 (03.27.24), #8 (05.08.24)  - BCVA OD  - OCT shows OD: Persistent cystic changes; stable central SRHM and patchy ORA; diffuse retinal thinning at 6 weeks  - recommend IVE OD #9 today, 06.19.24, w/ f/u in 6 wks.   - RBA of procedure discussed, questions answered - informed consent obtained and signed - see procedure note   - Avastin informed consent form re-signed and scanned on 05.19.23  - Eylea informed consent form signed and scanned on 08.11.23  - f/u in 6 weeks -- DFE/OCT, possible injection   2. Diabetes mellitus, type 2 without retinopathy - The incidence, risk factors for progression, natural history and treatment options for diabetic retinopathy were discussed with patient.   - The need for close monitoring of blood glucose, blood pressure, and serum lipids, avoiding cigarette or any type of tobacco, and the need for long term follow up was also discussed with patient.    3. Epiretinal membrane, OU  - relatively mild ERM OU -- blunted central foveal depression - OD with mild cystic changes 2/2 BRVO as above; OS without cystic changes or edema  - discussed findings and prognosis   - continue to monitor  4. Pseudophakia OU  - s/p CE/IOL OS 11.13.19 w/ Dr. Alben Spittle  - s/p CE/IOL OD 01.16.20 w/ Dr. Alben Spittle  - beautiful surgeries -- IOLs in perfect position  - healing well post-operatively - IRF/CME OD may have been partly due to post op CME ([place]) in addition to BRVO  5,6. Ocular hypertension / Glaucoma suspect OD>OS  - IOP 13 OU  - denies any family hx of Glaucoma   - under the expert management of Dr. Zenaida Niece  - most recent notes reviewed (04.30.24 visit w/ Dr. Zenaida Niece)  - on Cosopt BID OU and Brimonidine TID OU per Dr. Zenaida Niece  7,8. Hypertensive retinopathy OU - pt presented to 6.24.2020 visit urgently for right sided headache above right eye with extension to occiput - BP at that time was 201/100 -- pt was sent to primary care clinic for urgent  evaluation and meds were adjusted  - BP now under better control and headaches improved  - BP reading 11.1.22 -- 180/84 -- advised f/u with PCP  - discussed importance of tight BP control  Ophthalmic Meds Ordered this visit:  Meds ordered this encounter  Medications   aflibercept (EYLEA) SOLN 2 mg     Return in about 6 weeks (around 07/28/2023) for f/u BRVO OD, DFE, OCT.  There are no Patient Instructions on file for this visit.  This document serves as a record of services personally performed by Karie Chimera, MD, PhD. It was created on their behalf by Glee Arvin. Manson Passey, OA an ophthalmic technician. The creation of this record is the provider's dictation and/or activities during the visit.    Electronically signed by: Glee Arvin. Kristopher Oppenheim 06.19.2024 4:42 PM  Karie Chimera, M.D., Ph.D. Diseases & Surgery of the Retina and Vitreous [facility]  I have reviewed the above documentation for accuracy and completeness, and I agree with the above. Karie Chimera, M.D., Ph.D. 06/16/23 4:43 PM  Abbreviations: M myopia (nearsighted); A astigmatism; H hyperopia (farsighted); P  presbyopia; Mrx spectacle prescription;  CTL contact lenses; OD right eye; OS left eye; OU both eyes  XT exotropia; ET esotropia; PEK punctate epithelial keratitis; PEE punctate epithelial erosions; DES dry eye syndrome; MGD meibomian gland dysfunction; ATs artificial tears; PFAT's preservative free artificial tears; NSC nuclear sclerotic cataract; PSC posterior subcapsular cataract; ERM epi-retinal membrane; PVD posterior vitreous detachment; RD retinal detachment; DM diabetes mellitus; DR diabetic retinopathy; NPDR non-proliferative diabetic retinopathy; PDR proliferative diabetic retinopathy; CSME clinically significant macular edema; DME diabetic macular edema; dbh dot blot hemorrhages; CWS cotton wool spot; POAG primary open angle glaucoma; C/D cup-to-disc ratio; HVF humphrey visual field; GVF  goldmann visual field; OCT optical coherence tomography; IOP intraocular pressure; BRVO Branch retinal vein occlusion; CRVO central retinal vein occlusion; CRAO central retinal artery occlusion; BRAO branch retinal artery occlusion; RT retinal tear; SB scleral buckle; PPV pars plana vitrectomy; VH Vitreous hemorrhage; PRP panretinal laser photocoagulation; IVK intravitreal kenalog; VMT vitreomacular traction; MH Macular hole;  NVD neovascularization of the disc; NVE neovascularization elsewhere; AREDS age related eye disease study; ARMD age related macular degeneration; POAG primary open angle glaucoma; EBMD epithelial/anterior basement membrane dystrophy; ACIOL anterior chamber intraocular lens; IOL intraocular lens; PCIOL posterior chamber intraocular lens; Phaco/IOL phacoemulsification with intraocular lens placement; PRK photorefractive keratectomy; LASIK laser assisted in situ keratomileusis; HTN hypertension; DM diabetes mellitus; COPD chronic obstructive pulmonary disease

## 2023-07-06 DIAGNOSIS — L603 Nail dystrophy: Secondary | ICD-10-CM | POA: Diagnosis not present

## 2023-07-06 DIAGNOSIS — I739 Peripheral vascular disease, unspecified: Secondary | ICD-10-CM | POA: Diagnosis not present

## 2023-07-06 DIAGNOSIS — L84 Corns and callosities: Secondary | ICD-10-CM | POA: Diagnosis not present

## 2023-07-07 DIAGNOSIS — L810 Postinflammatory hyperpigmentation: Secondary | ICD-10-CM | POA: Diagnosis not present

## 2023-07-07 DIAGNOSIS — L821 Other seborrheic keratosis: Secondary | ICD-10-CM | POA: Diagnosis not present

## 2023-07-08 DIAGNOSIS — F0280 Dementia in other diseases classified elsewhere without behavioral disturbance: Secondary | ICD-10-CM | POA: Diagnosis not present

## 2023-07-08 DIAGNOSIS — I10 Essential (primary) hypertension: Secondary | ICD-10-CM | POA: Diagnosis not present

## 2023-07-08 DIAGNOSIS — R011 Cardiac murmur, unspecified: Secondary | ICD-10-CM | POA: Diagnosis not present

## 2023-07-08 DIAGNOSIS — Z6821 Body mass index (BMI) 21.0-21.9, adult: Secondary | ICD-10-CM | POA: Diagnosis not present

## 2023-07-08 DIAGNOSIS — E039 Hypothyroidism, unspecified: Secondary | ICD-10-CM | POA: Diagnosis not present

## 2023-07-09 ENCOUNTER — Other Ambulatory Visit (HOSPITAL_COMMUNITY): Payer: Self-pay | Admitting: Family Medicine

## 2023-07-09 DIAGNOSIS — I10 Essential (primary) hypertension: Secondary | ICD-10-CM

## 2023-07-09 DIAGNOSIS — R011 Cardiac murmur, unspecified: Secondary | ICD-10-CM

## 2023-07-26 NOTE — Progress Notes (Signed)
[facility] - Clinic Note  07/28/2023    CHIEF COMPLAINT Patient presents for Retina Follow Up  HISTORY OF PRESENT ILLNESS: Alejandra Davis is a 87 y.o. female who presents to the clinic today for:   HPI     Retina Follow Up   Patient presents with  CRVO/BRVO.  In right eye.  This started 6 weeks ago.  Duration of 6 weeks.  Since onset it is stable.  I, the attending physician,  performed the HPI with the patient and updated documentation appropriately.        Comments   6 week retina follow up BRVO and IVE OD pt is reporting no vision changes noticed she denies any flashes or floaters       Last edited by Rennis Chris, MD on 07/28/2023 11:42 AM.    Patient feels that vision is slightly improving.   Referring physician: Mliss Sax, MD [address] [place],  [place] [address]  HISTORICAL INFORMATION:   Selected notes from the MEDICAL RECORD NUMBER Referred by Dr. Joylene Igo for DM exam   CURRENT MEDICATIONS: Current Outpatient Medications (Ophthalmic Drugs)  Medication Sig   dorzolamide-timolol (COSOPT) 22.3-6.8 MG/ML ophthalmic solution Place 1 drop into both eyes 2 (two) times daily.   No current facility-administered medications for this visit. (Ophthalmic Drugs)   Current Outpatient Medications (Other)  Medication Sig   amLODipine (NORVASC) 10 MG tablet TAKE 1 TABLET(10 MG) BY MOUTH DAILY   ASPIRIN LOW DOSE 81 MG EC tablet Take 81 mg by mouth daily.   levothyroxine (SYNTHROID) 75 MCG tablet Take 1 tablet (75 mcg total) by mouth daily.   lisinopril (ZESTRIL) 2.5 MG tablet Take 1 tablet (2.5 mg total) by mouth daily.   loratadine (CLARITIN) 10 MG tablet TAKE 1 TABLET(10 MG) BY MOUTH DAILY   memantine (NAMENDA XR) 7 MG CP24 24 hr capsule Take 1 capsule (7 mg total) by mouth daily.   metoprolol tartrate (LOPRESSOR) 50 MG tablet TAKE 1 TABLET(50 MG) BY MOUTH DAILY   No current facility-administered medications for this visit.  (Other)   REVIEW OF SYSTEMS: ROS   Positive for: Skin, Genitourinary, Musculoskeletal, Endocrine, Cardiovascular, Eyes Negative for: Constitutional, Gastrointestinal, Neurological, HENT, Respiratory, Psychiatric, Allergic/Imm, Heme/Lymph Last edited by Etheleen Mayhew, COT on 07/28/2023  9:30 AM.      ALLERGIES Allergies  Allergen Reactions   Donepezil Hcl     Stomach cramps    PAST MEDICAL HISTORY Past Medical History:  Diagnosis Date   Chicken pox    Cystocele    Diabetes mellitus without complication (HCC)    Glaucoma    Hypertension    Hypertensive retinopathy    OU   Neuromuscular disorder (HCC)    Dementia    Thyroid disease    hypothyroidism   Past Surgical History:  Procedure Laterality Date   ABDOMINAL HYSTERECTOMY  1990   TAH BSO   CATARACT EXTRACTION Bilateral    Dr. Alben Spittle   COLONOSCOPY WITH PROPOFOL N/A 04/08/2021   Procedure: COLONOSCOPY WITH PROPOFOL;  Surgeon: Charlott Rakes, MD;  Location: [facility];  Service: Endoscopy;  Laterality: N/A;   EYE SURGERY Bilateral    Cat Sx OU   OOPHORECTOMY     BSO   Vaginal Bx     Papilloma   FAMILY HISTORY Family History  Problem Relation Age of Onset   Sickle cell anemia Other    Hypertension Mother    Cancer Father  LIVER   Heart disease Brother    Cancer Brother        STOMACH   SOCIAL HISTORY Social History   Tobacco Use   Smoking status: Never   Smokeless tobacco: Never  Vaping Use   Vaping status: Never Used  Substance Use Topics   Alcohol use: No   Drug use: No       OPHTHALMIC EXAM: Base Eye Exam     Visual Acuity (Snellen - Linear)       Right Left   Dist cc 20/100 20/200   Dist ph cc NI 20/50 -2         Tonometry (Tonopen, 9:36 AM)       Right Left   Pressure 12 14         Pupils       Pupils Dark Light Shape React APD   Right PERRL 3 2 Round Brisk None   Left PERRL 3 2 Round Brisk None         Visual Fields       Left Right    Full     Restrictions  Partial outer superior temporal, inferior temporal deficiencies         Extraocular Movement       Right Left    Full, Ortho Full, Ortho         Neuro/Psych     Oriented x3: Yes   Mood/Affect: Normal         Dilation     Both eyes: 2.5% Phenylephrine @ 9:36 AM           Slit Lamp and Fundus Exam     Slit Lamp Exam       Right Left   Lids/Lashes Dermatochalasis - upper lid, Meibomian gland dysfunction Dermatochalasis - upper lid, Meibomian gland dysfunction   Conjunctiva/Sclera Melanosis Melanosis   Cornea Arcus, trace Punctate epithelial erosions, tear film debris, 1+ [place]utt[place], Well healed temporal cataract wound Arcus, Inferior 1+ Punctate epithelial erosions, well healed cataract wound   Anterior Chamber Deep and quiet Deep;  no cell or flare   Iris Round and dilated, No NVI Round and dilated, No NVI   Lens PC IOL in good position, 2+ Posterior capsular opacification Posterior chamber intraocular lens in good position, 1-2+ PCO.   Anterior Vitreous Vitreous syneresis, vitreous condensations, silicone oil bubbles Vitreous syneresis, Vitreous condensations         Fundus Exam       Right Left   Disc Sharp rim, inferior notch, 3-4+ pallor, inf. Rim thinning, attenuated vessels superiorly, fine vascular loops superiorly 2+ pallor, Sharp rim, thin inferior rim   C/D Ratio 0.85 0.75   Macula Blunted foveal reflex, persistent cystic changes superior mac; stable improvement in DBH superior mac, central exudates, fibrosis and atrophy -- stable, no frank heme Flat, good foveal reflex, mild RPE mottling and clumping, Epiretinal membrane, No heme or edema   Vessels superior BRVO with severe attenuation of ST arterioles, Tortuous attenuated, Tortuous   Periphery Attached, DBH superior hemisphere extended from BRVO- resolved Attached, mild scattered Reticular degeneration, No heme           Refraction     Wearing Rx       Sphere Cylinder Axis Add    Right -0.75 +1.25 170 +2.50   Left -0.75 +1.00 003 +2.50    Type: PAL         Wearing Rx #2       Sphere Cylinder  Axis Add   Right -0.75 +1.25 170 +2.50   Left -0.75 +1.00 003 +2.50    Type: PAL           IMAGING AND PROCEDURES  Imaging and Procedures for 03/07/18  OCT, Retina - OU - Both Eyes       Right Eye Quality was good. Central Foveal Thickness: 320. Progression has been stable. Findings include no SRF, abnormal foveal contour, subretinal hyper-reflective material, intraretinal hyper-reflective material, epiretinal membrane, intraretinal fluid, outer retinal atrophy, vitreomacular adhesion (Persistent cystic changes, central SRHM and patchy ORA; diffuse retinal thinning -- stable, persistent VMT-- contributing to cystic changes ).   Left Eye Quality was good. Central Foveal Thickness: 250. Progression has been stable. Findings include no IRF, no SRF, abnormal foveal contour, epiretinal membrane, vitreomacular adhesion (Blunted foveal depression -- stable).   Notes *Images captured and stored on drive  Diagnosis / Impression:  ERM OU OD: BRVO w/ CME -- Persistent cystic changes, central SRHM and patchy ORA; diffuse retinal thinning -- stable, persistent VMT-- contributing to cystic changes OS: very mild ERM with blunted foveal depression -- stable; mild VMA  Clinical management:  See below  Abbreviations: NFP - Normal foveal profile. CME - cystoid macular edema. PED - pigment epithelial detachment. IRF - intraretinal fluid. SRF - subretinal fluid. EZ - ellipsoid zone. ERM - epiretinal membrane. ORA - outer retinal atrophy. ORT - outer retinal tubulation. SRHM - subretinal hyper-reflective material       Intravitreal Injection, Pharmacologic Agent - OD - Right Eye       Time Out 07/28/2023. 10:17 AM. Confirmed correct patient, procedure, site, and patient consented.   Anesthesia Topical anesthesia was used. Anesthetic medications included Lidocaine 2%,  Proparacaine 0.5%.   Procedure Preparation included 5% betadine to ocular surface, eyelid speculum. A (32g) needle was used.   Injection: 2 mg aflibercept 2 MG/0.05ML   Route: Intravitreal, Site: Right Eye   NDC: L6038910, Lot: [id number], Expiration date: 08/27/2024, Waste: 0 mL   Post-op Post injection exam found visual acuity of at least counting fingers. The patient tolerated the procedure well. There were no complications. The patient received written and verbal post procedure care education. Post injection medications were not given.            ASSESSMENT/PLAN:   ICD-10-CM   1. Branch retinal vein occlusion of right eye with macular edema  H34.8310 OCT, Retina - OU - Both Eyes    Intravitreal Injection, Pharmacologic Agent - OD - Right Eye    aflibercept (EYLEA) SOLN 2 mg    2. Diabetes mellitus type 2 without retinopathy (HCC)  E11.9     3. Epiretinal membrane (ERM) of both eyes  H35.373     4. Pseudophakia of both eyes  Z96.1     5. Ocular hypertension of right eye  H40.051     6. Glaucoma suspect of both eyes  H40.003     7. Essential hypertension  I10     8. Hypertensive retinopathy of both eyes  H35.033      1. BRVO with CME OD - lost to follow up from 02.22.22 to 11.1.22 -- re-presented with massive CME and BCVA 20/400 from 20/30  - initial OCT w/ CME superior macula - s/p IVA OD #1 (10.18.19), #2 (11.19.19), #3 (12.17.19), #4 (02.19.20), #5 (03.23.20), #6 (04.21.20), #7 (05.27.20), #8 (07.07.20), #9 (08.25.20), #10 (9.29.20), #11 (11.17.20), #12 (12.22.20), #13 (01.27.21), #14 (03.10.21), #15 (04.21.21), #16 (05.26.21), #17 (06.30.21), #18 (08.04.21), #19 (09.01.21), #20 (  10.06.21), #21 (11.17.21), #22 (12.30.21), #23 (2.22.22), #24 (11.1.22), #25 (11.29.22), #26 (01.03.23), #27 (02.14.23), #28 (04.07.23), #29 (05.19.23), #30 (06.20.23) -- IVA resistance ===========================================================  **history of worsening IRF/cystic changes  at 6+ weeks interval, IVA** - s/p IVE OD #1 (08.11.23), #2 (09.08.23), #3 (10.06.23), #4 (11.06.23), #5 (12.13.23), #6 (01.24.24), #7 (03.27.24), #8 (05.08.24), #9 (06.19.24)  - BCVA OD 20/100 from 20/200 - OCT shows OD: Persistent cystic changes, central SRHM and patchy ORA; diffuse retinal thinning -- stable, persistent VMT-- contributing to cystic changes at 6 weeks  - recommend IVE OD #10 today, 07.31.24, w/ f/u in 6 wks   - RBA of procedure discussed, questions answered - informed consent obtained and signed - see procedure note   - Avastin informed consent form re-signed and scanned on 05.19.23  - Eylea informed consent form signed and scanned on 08.11.23  - f/u in 6 weeks -- DFE/OCT, possible injection   2. Diabetes mellitus, type 2 without retinopathy - The incidence, risk factors for progression, natural history and treatment options for diabetic retinopathy were discussed with patient.   - The need for close monitoring of blood glucose, blood pressure, and serum lipids, avoiding cigarette or any type of tobacco, and the need for long term follow up was also discussed with patient.    3. Epiretinal membrane, OU  - relatively mild ERM OU -- blunted central foveal depression - OD with mild cystic changes 2/2 BRVO as above; OS without cystic changes or edema  - discussed findings and prognosis   - continue to monitor  4. Pseudophakia OU  - s/p CE/IOL OS 11.13.19 w/ Dr. Alben Spittle  - s/p CE/IOL OD 01.16.20 w/ Dr. Alben Spittle  - beautiful surgeries -- IOLs in perfect position  - healing well post-operatively - IRF/CME OD may have been partly due to post op CME ([place]) in addition to BRVO  5,6. Ocular hypertension / Glaucoma suspect OD>OS  - IOP 12, 14  - denies any family hx of Glaucoma   - under the expert management of Dr. Zenaida Niece  - most recent notes reviewed (04.30.24 visit w/ Dr. Zenaida Niece)  - on Cosopt BID OU and Brimonidine TID OU per Dr. Zenaida Niece  7,8. Hypertensive retinopathy OU - pt  presented to 6.24.2020 visit urgently for right sided headache above right eye with extension to occiput - BP at that time was 201/100 -- pt was sent to primary care clinic for urgent evaluation and meds were adjusted  - BP now under better control and headaches improved  - BP reading 11.1.22 -- 180/84 -- advised f/u with PCP  - discussed importance of tight BP control  Ophthalmic Meds Ordered this visit:  Meds ordered this encounter  Medications   aflibercept (EYLEA) SOLN 2 mg     Return in about 6 weeks (around 09/08/2023) for f/u BRVO OD, DFE, OCT, Possible, IVE, OD.  There are no Patient Instructions on file for this visit.  This document serves as a record of services personally performed by Karie Chimera, MD, PhD. It was created on their behalf by De Blanch, an ophthalmic technician. The creation of this record is the provider's dictation and/or activities during the visit.    Electronically signed by: De Blanch, OA, 07/28/23  11:45 AM  This document serves as a record of services personally performed by Karie Chimera, MD, PhD. It was created on their behalf by Gerilyn Nestle, COT an ophthalmic technician. The creation of this record is the provider's dictation and/or activities during  the visit.    Electronically signed by:  Charlette Caffey, COT  07/28/23 11:45 AM  Karie Chimera, M.D., Ph.D. Diseases & Surgery of the Retina and Vitreous [facility]  I have reviewed the above documentation for accuracy and completeness, and I agree with the above. Karie Chimera, M.D., Ph.D. 07/28/23 11:46 AM   Abbreviations: M myopia (nearsighted); A astigmatism; H hyperopia (farsighted); P presbyopia; Mrx spectacle prescription;  CTL contact lenses; OD right eye; OS left eye; OU both eyes  XT exotropia; ET esotropia; PEK punctate epithelial keratitis; PEE punctate epithelial erosions; DES dry eye syndrome; MGD meibomian gland dysfunction; ATs  artificial tears; PFAT's preservative free artificial tears; NSC nuclear sclerotic cataract; PSC posterior subcapsular cataract; ERM epi-retinal membrane; PVD posterior vitreous detachment; RD retinal detachment; DM diabetes mellitus; DR diabetic retinopathy; NPDR non-proliferative diabetic retinopathy; PDR proliferative diabetic retinopathy; CSME clinically significant macular edema; DME diabetic macular edema; dbh dot blot hemorrhages; CWS cotton wool spot; POAG primary open angle glaucoma; C/D cup-to-disc ratio; HVF humphrey visual field; GVF goldmann visual field; OCT optical coherence tomography; IOP intraocular pressure; BRVO Branch retinal vein occlusion; CRVO central retinal vein occlusion; CRAO central retinal artery occlusion; BRAO branch retinal artery occlusion; RT retinal tear; SB scleral buckle; PPV pars plana vitrectomy; VH Vitreous hemorrhage; PRP panretinal laser photocoagulation; IVK intravitreal kenalog; VMT vitreomacular traction; MH Macular hole;  NVD neovascularization of the disc; NVE neovascularization elsewhere; AREDS age related eye disease study; ARMD age related macular degeneration; POAG primary open angle glaucoma; EBMD epithelial/anterior basement membrane dystrophy; ACIOL anterior chamber intraocular lens; IOL intraocular lens; PCIOL posterior chamber intraocular lens; Phaco/IOL phacoemulsification with intraocular lens placement; PRK photorefractive keratectomy; LASIK laser assisted in situ keratomileusis; HTN hypertension; DM diabetes mellitus; COPD chronic obstructive pulmonary disease

## 2023-07-28 ENCOUNTER — Encounter (HOSPITAL_COMMUNITY): Payer: Self-pay

## 2023-07-28 ENCOUNTER — Ambulatory Visit (INDEPENDENT_AMBULATORY_CARE_PROVIDER_SITE_OTHER): Payer: Medicare PPO | Admitting: Ophthalmology

## 2023-07-28 ENCOUNTER — Ambulatory Visit (HOSPITAL_COMMUNITY): Payer: Medicare PPO

## 2023-07-28 ENCOUNTER — Encounter (INDEPENDENT_AMBULATORY_CARE_PROVIDER_SITE_OTHER): Payer: Self-pay | Admitting: Ophthalmology

## 2023-07-28 DIAGNOSIS — H35033 Hypertensive retinopathy, bilateral: Secondary | ICD-10-CM

## 2023-07-28 DIAGNOSIS — I10 Essential (primary) hypertension: Secondary | ICD-10-CM

## 2023-07-28 DIAGNOSIS — H40003 Preglaucoma, unspecified, bilateral: Secondary | ICD-10-CM

## 2023-07-28 DIAGNOSIS — H34831 Tributary (branch) retinal vein occlusion, right eye, with macular edema: Secondary | ICD-10-CM

## 2023-07-28 DIAGNOSIS — H35373 Puckering of macula, bilateral: Secondary | ICD-10-CM | POA: Diagnosis not present

## 2023-07-28 DIAGNOSIS — H40051 Ocular hypertension, right eye: Secondary | ICD-10-CM

## 2023-07-28 DIAGNOSIS — Z961 Presence of intraocular lens: Secondary | ICD-10-CM

## 2023-07-28 DIAGNOSIS — E119 Type 2 diabetes mellitus without complications: Secondary | ICD-10-CM

## 2023-07-28 MED ORDER — AFLIBERCEPT 2MG/0.05ML IZ SOLN FOR KALEIDOSCOPE
2.0000 mg | INTRAVITREAL | Status: AC | PRN
  Administered 2023-07-28: 2 mg via INTRAVITREAL

## 2023-07-30 ENCOUNTER — Ambulatory Visit (HOSPITAL_COMMUNITY)
Admission: RE | Admit: 2023-07-30 | Discharge: 2023-07-30 | Disposition: A | Discharge status: Home / Self Care | Payer: Medicare PPO | Source: Ambulatory Visit | Attending: Family Medicine | Admitting: Family Medicine

## 2023-07-30 ENCOUNTER — Encounter (INDEPENDENT_AMBULATORY_CARE_PROVIDER_SITE_OTHER): Payer: Self-pay | Admitting: Ophthalmology

## 2023-07-30 DIAGNOSIS — N183 Chronic kidney disease, stage 3 unspecified: Secondary | ICD-10-CM | POA: Diagnosis not present

## 2023-07-30 DIAGNOSIS — R011 Cardiac murmur, unspecified: Secondary | ICD-10-CM | POA: Diagnosis not present

## 2023-07-30 DIAGNOSIS — I517 Cardiomegaly: Secondary | ICD-10-CM | POA: Insufficient documentation

## 2023-07-30 DIAGNOSIS — I129 Hypertensive chronic kidney disease with stage 1 through stage 4 chronic kidney disease, or unspecified chronic kidney disease: Secondary | ICD-10-CM | POA: Insufficient documentation

## 2023-07-30 DIAGNOSIS — E1122 Type 2 diabetes mellitus with diabetic chronic kidney disease: Secondary | ICD-10-CM | POA: Diagnosis not present

## 2023-07-30 DIAGNOSIS — I10 Essential (primary) hypertension: Secondary | ICD-10-CM | POA: Diagnosis not present

## 2023-07-30 DIAGNOSIS — E785 Hyperlipidemia, unspecified: Secondary | ICD-10-CM | POA: Diagnosis not present

## 2023-07-30 LAB — ECHOCARDIOGRAM COMPLETE
AR max vel: 1.27 cm2
AV Area VTI: 1.33 cm2
AV Area mean vel: 1.2 cm2
AV Mean grad: 6.5 mmHg
AV Peak grad: 12.5 mmHg
AV Vena cont: 0.4 cm
Ao pk vel: 1.77 m/s
Area-P 1/2: 3.6 cm2
P 1/2 time: 509 msec
S' Lateral: 2.1 cm

## 2023-07-30 NOTE — Progress Notes (Signed)
*  PRELIMINARY RESULTS* Echocardiogram 2D Echocardiogram has been performed.  Alejandra Davis 07/30/2023, 9:50 AM

## 2023-08-01 ENCOUNTER — Encounter: Payer: Self-pay | Admitting: Vascular Surgery

## 2023-09-01 NOTE — Progress Notes (Signed)
[facility] - Clinic Note  09/08/2023    CHIEF COMPLAINT Patient presents for Retina Follow Up  HISTORY OF PRESENT ILLNESS: Alejandra Davis is a 87 y.o. female who presents to the clinic today for:   HPI     Retina Follow Up   Patient presents with  CRVO/BRVO.  In right eye.  This started 6 weeks ago.  Duration of 6 weeks.  Since onset it is stable.  I, the attending physician,  performed the HPI with the patient and updated documentation appropriately.        Comments   6 week retina follow up BRVO OD and IVE OD pt reporting no vision changes noticed she denies any flashes or floaters       Last edited by Rennis Chris, MD on 09/08/2023 11:34 PM.      Referring physician: Moshe Cipro, FNP [address] [place],  [place] [address]  HISTORICAL INFORMATION:   Selected notes from the MEDICAL RECORD NUMBER Referred by Dr. Joylene Igo for DM exam   CURRENT MEDICATIONS: Current Outpatient Medications (Ophthalmic Drugs)  Medication Sig   dorzolamide-timolol (COSOPT) 22.3-6.8 MG/ML ophthalmic solution Place 1 drop into both eyes 2 (two) times daily.   No current facility-administered medications for this visit. (Ophthalmic Drugs)   Current Outpatient Medications (Other)  Medication Sig   amLODipine (NORVASC) 10 MG tablet TAKE 1 TABLET(10 MG) BY MOUTH DAILY   ASPIRIN LOW DOSE 81 MG EC tablet Take 81 mg by mouth daily.   levothyroxine (SYNTHROID) 75 MCG tablet Take 1 tablet (75 mcg total) by mouth daily.   lisinopril (ZESTRIL) 2.5 MG tablet Take 1 tablet (2.5 mg total) by mouth daily.   loratadine (CLARITIN) 10 MG tablet TAKE 1 TABLET(10 MG) BY MOUTH DAILY   memantine (NAMENDA XR) 7 MG CP24 24 hr capsule Take 1 capsule (7 mg total) by mouth daily.   metoprolol tartrate (LOPRESSOR) 50 MG tablet TAKE 1 TABLET(50 MG) BY MOUTH DAILY   No current facility-administered medications for this visit. (Other)   REVIEW OF SYSTEMS: ROS   Positive  for: Skin, Genitourinary, Musculoskeletal, Endocrine, Cardiovascular, Eyes Negative for: Constitutional, Gastrointestinal, Neurological, HENT, Respiratory, Psychiatric, Allergic/Imm, Heme/Lymph Last edited by Etheleen Mayhew, COT on 09/08/2023  9:58 AM.       ALLERGIES Allergies  Allergen Reactions   Donepezil Hcl     Stomach cramps    PAST MEDICAL HISTORY Past Medical History:  Diagnosis Date   Chicken pox    Cystocele    Diabetes mellitus without complication (HCC)    Glaucoma    Hypertension    Hypertensive retinopathy    OU   Neuromuscular disorder (HCC)    Dementia    Thyroid disease    hypothyroidism   Past Surgical History:  Procedure Laterality Date   ABDOMINAL HYSTERECTOMY  1990   TAH BSO   CATARACT EXTRACTION Bilateral    Dr. Alben Spittle   COLONOSCOPY WITH PROPOFOL N/A 04/08/2021   Procedure: COLONOSCOPY WITH PROPOFOL;  Surgeon: Charlott Rakes, MD;  Location: [facility];  Service: Endoscopy;  Laterality: N/A;   EYE SURGERY Bilateral    Cat Sx OU   OOPHORECTOMY     BSO   Vaginal Bx     Papilloma   FAMILY HISTORY Family History  Problem Relation Age of Onset   Sickle cell anemia Other    Hypertension Mother    Cancer Father        LIVER  Heart disease Brother    Cancer Brother        STOMACH   SOCIAL HISTORY Social History   Tobacco Use   Smoking status: Never   Smokeless tobacco: Never  Vaping Use   Vaping status: Never Used  Substance Use Topics   Alcohol use: No   Drug use: No       OPHTHALMIC EXAM: Base Eye Exam     Visual Acuity (Snellen - Linear)       Right Left   Dist cc 20/200 20/150   Dist ph cc 20/80 20/40 -1    Correction: Glasses         Tonometry (Tonopen, 10:03 AM)       Right Left   Pressure 9 12         Pupils       Pupils Dark Light Shape React APD   Right PERRL 3 2 Round Brisk None   Left PERRL 3 2 Round Brisk None         Visual Fields       Left Right    Full    Restrictions   Partial outer superior temporal, inferior temporal deficiencies         Extraocular Movement       Right Left    Full, Ortho Full, Ortho         Neuro/Psych     Oriented x3: Yes   Mood/Affect: Normal         Dilation     Both eyes: 2.5% Phenylephrine @ 10:03 AM           Slit Lamp and Fundus Exam     Slit Lamp Exam       Right Left   Lids/Lashes Dermatochalasis - upper lid, Meibomian gland dysfunction Dermatochalasis - upper lid, Meibomian gland dysfunction   Conjunctiva/Sclera Melanosis Melanosis   Cornea Arcus, trace Punctate epithelial erosions, tear film debris, 1+ [place], Well healed temporal cataract wound Arcus, Inferior 1+ Punctate epithelial erosions, well healed cataract wound   Anterior Chamber Deep and quiet Deep;  no cell or flare   Iris Round and dilated, No NVI Round and dilated, No NVI   Lens PC IOL in good position, 2+ Posterior capsular opacification Posterior chamber intraocular lens in good position, 1-2+ PCO.   Anterior Vitreous Vitreous syneresis, vitreous condensations, silicone oil bubbles Vitreous syneresis, Vitreous condensations         Fundus Exam       Right Left   Disc Sharp rim, inferior notch, 3-4+ pallor, inf. Rim thinning, attenuated vessels superiorly, fine vascular loops superiorly 2+ pallor, Sharp rim, thin inferior rim   C/D Ratio 0.85 0.75   Macula Blunted foveal reflex, persistent cystic changes superior mac; stable improvement in DBH superior mac, central exudates, fibrosis and atrophy -- stable, no frank heme Flat, good foveal reflex, mild RPE mottling and clumping, Epiretinal membrane, No heme or edema   Vessels superior BRVO with severe attenuation of ST arterioles, Tortuous attenuated, Tortuous   Periphery Attached, DBH superior hemisphere extended from BRVO- resolved Attached, mild scattered Reticular degeneration, No heme           Refraction     Wearing Rx       Sphere Cylinder Axis Add   Right -0.75  +1.25 170 +2.50   Left -0.75 +1.00 003 +2.50    Type: PAL         Wearing Rx #2  Sphere Cylinder Axis Add   Right -0.75 +1.25 170 +2.50   Left -0.75 +1.00 003 +2.50    Type: PAL           IMAGING AND PROCEDURES  Imaging and Procedures for 03/07/18  OCT, Retina - OU - Both Eyes       Right Eye Quality was good. Central Foveal Thickness: 334. Progression has been stable. Findings include no SRF, abnormal foveal contour, subretinal hyper-reflective material, intraretinal hyper-reflective material, epiretinal membrane, intraretinal fluid, outer retinal atrophy, vitreomacular adhesion (Persistent cystic changes, central SRHM and patchy ORA; diffuse retinal thinning -- stable, persistent VMT -- contributing to cystic changes ).   Left Eye Quality was good. Central Foveal Thickness: 250. Progression has been stable. Findings include no IRF, no SRF, abnormal foveal contour, epiretinal membrane, vitreomacular adhesion (Blunted foveal depression -- stable).   Notes *Images captured and stored on drive  Diagnosis / Impression:  ERM OU OD: BRVO w/ CME -- Persistent cystic changes, central SRHM and patchy ORA; diffuse retinal thinning -- stable, persistent VMT-- contributing to cystic changes OS: very mild ERM with blunted foveal depression -- stable; mild VMA  Clinical management:  See below  Abbreviations: NFP - Normal foveal profile. CME - cystoid macular edema. PED - pigment epithelial detachment. IRF - intraretinal fluid. SRF - subretinal fluid. EZ - ellipsoid zone. ERM - epiretinal membrane. ORA - outer retinal atrophy. ORT - outer retinal tubulation. SRHM - subretinal hyper-reflective material       Intravitreal Injection, Pharmacologic Agent - OD - Right Eye       Time Out 09/08/2023. 11:42 AM. Confirmed correct patient, procedure, site, and patient consented.   Anesthesia Topical anesthesia was used. Anesthetic medications included Lidocaine 2%, Proparacaine  0.5%.   Procedure Preparation included 5% betadine to ocular surface, eyelid speculum. A (32g) needle was used.   Injection: 2 mg aflibercept 2 MG/0.05ML   Route: Intravitreal, Site: Right Eye   NDC: L6038910, Lot: [id number], Expiration date: 11/26/2024, Waste: 0 mL   Post-op Post injection exam found visual acuity of at least counting fingers. The patient tolerated the procedure well. There were no complications. The patient received written and verbal post procedure care education. Post injection medications were not given.            ASSESSMENT/PLAN:   ICD-10-CM   1. Branch retinal vein occlusion of right eye with macular edema  H34.8310 OCT, Retina - OU - Both Eyes    Intravitreal Injection, Pharmacologic Agent - OD - Right Eye    aflibercept (EYLEA) SOLN 2 mg    2. Diabetes mellitus type 2 without retinopathy (HCC)  E11.9     3. Epiretinal membrane (ERM) of both eyes  H35.373     4. Pseudophakia of both eyes  Z96.1     5. Ocular hypertension of right eye  H40.051     6. Glaucoma suspect of both eyes  H40.003     7. Essential hypertension  I10     8. Hypertensive retinopathy of both eyes  H35.033       1. BRVO with CME OD - lost to follow up from 02.22.22 to 11.1.22 -- re-presented with massive CME and BCVA 20/400 from 20/30  - initial OCT w/ CME superior macula - s/p IVA OD #1 (10.18.19), #2 (11.19.19), #3 (12.17.19), #4 (02.19.20), #5 (03.23.20), #6 (04.21.20), #7 (05.27.20), #8 (07.07.20), #9 (08.25.20), #10 (9.29.20), #11 (11.17.20), #12 (12.22.20), #13 (01.27.21), #14 (03.10.21), #15 (04.21.21), #16 (05.26.21), #17 (06.30.21), #18 (  08.04.21), #19 (09.01.21), #20 (10.06.21), #21 (11.17.21), #22 (12.30.21), #23 (2.22.22), #24 (11.1.22), #25 (11.29.22), #26 (01.03.23), #27 (02.14.23), #28 (04.07.23), #29 (05.19.23), #30 (06.20.23) -- IVA resistance ===========================================================  **history of worsening IRF/cystic changes at 6+ weeks  interval, IVA** - s/p IVE OD #1 (08.11.23), #2 (09.08.23), #3 (10.06.23), #4 (11.06.23), #5 (12.13.23), #6 (01.24.24), #7 (03.27.24), #8 (05.08.24), #9 (06.19.24), #10 (07.31.24)  - BCVA OD 20/100 from 20/200 - OCT shows OD: Persistent cystic changes, central SRHM and patchy ORA; diffuse retinal thinning -- stable, persistent VMT-- contributing to cystic changes at 6 weeks  - recommend IVE OD #11 today, 09.11.24, w/ f/u extended to 7 wks   - RBA of procedure discussed, questions answered - informed consent obtained and signed - see procedure note   - Eylea informed consent form signed and scanned on 08.11.23  - f/u in 6 weeks -- DFE/OCT, possible injection   2. Diabetes mellitus, type 2 without retinopathy - The incidence, risk factors for progression, natural history and treatment options for diabetic retinopathy were discussed with patient.   - The need for close monitoring of blood glucose, blood pressure, and serum lipids, avoiding cigarette or any type of tobacco, and the need for long term follow up was also discussed with patient.    3. Epiretinal membrane, OU  - relatively mild ERM OU -- blunted central foveal depression - OD with mild cystic changes 2/2 BRVO as above; OS without cystic changes or edema  - discussed findings and prognosis   - continue to monitor  4. Pseudophakia OU  - s/p CE/IOL OS 11.13.19 w/ Dr. Alben Spittle  - s/p CE/IOL OD 01.16.20 w/ Dr. Alben Spittle  - beautiful surgeries -- IOLs in perfect position  - healing well post-operatively - IRF/CME OD may have been partly due to post op CME ([place]) in addition to BRVO  5,6. Ocular hypertension / Glaucoma suspect OD>OS  - IOP 12, 14  - denies any family hx of Glaucoma   - under the expert management of Dr. Zenaida Niece  - most recent notes reviewed (04.30.24 visit w/ Dr. Zenaida Niece)  - on Cosopt BID OU and Brimonidine TID OU per Dr. Zenaida Niece  7,8. Hypertensive retinopathy OU - pt presented to 6.24.2020 visit urgently for right sided  headache above right eye with extension to occiput - BP at that time was 201/100 -- pt was sent to primary care clinic for urgent evaluation and meds were adjusted  - BP now under better control and headaches improved  - BP reading 11.1.22 -- 180/84 -- advised f/u with PCP  - discussed importance of tight BP control  Ophthalmic Meds Ordered this visit:  Meds ordered this encounter  Medications   aflibercept (EYLEA) SOLN 2 mg     Return in about 7 weeks (around 10/27/2023) for f/u BRVO OD, DFE, OCT.  There are no Patient Instructions on file for this visit.  This document serves as a record of services personally performed by Karie Chimera, MD, PhD. It was created on their behalf by De Blanch, an ophthalmic technician. The creation of this record is the provider's dictation and/or activities during the visit.    Electronically signed by: De Blanch, OA, 09/08/23  11:38 PM  Karie Chimera, M.D., Ph.D. Diseases & Surgery of the Retina and Vitreous [facility]  I have reviewed the above documentation for accuracy and completeness, and I agree with the above. Karie Chimera, M.D., Ph.D. 09/08/23 11:39 PM   Abbreviations: Judie Petit  myopia (nearsighted); A astigmatism; H hyperopia (farsighted); P presbyopia; Mrx spectacle prescription;  CTL contact lenses; OD right eye; OS left eye; OU both eyes  XT exotropia; ET esotropia; PEK punctate epithelial keratitis; PEE punctate epithelial erosions; DES dry eye syndrome; MGD meibomian gland dysfunction; ATs artificial tears; PFAT's preservative free artificial tears; NSC nuclear sclerotic cataract; PSC posterior subcapsular cataract; ERM epi-retinal membrane; PVD posterior vitreous detachment; RD retinal detachment; DM diabetes mellitus; DR diabetic retinopathy; NPDR non-proliferative diabetic retinopathy; PDR proliferative diabetic retinopathy; CSME clinically significant macular edema; DME diabetic macular edema; dbh  dot blot hemorrhages; CWS cotton wool spot; POAG primary open angle glaucoma; C/D cup-to-disc ratio; HVF humphrey visual field; GVF goldmann visual field; OCT optical coherence tomography; IOP intraocular pressure; BRVO Branch retinal vein occlusion; CRVO central retinal vein occlusion; CRAO central retinal artery occlusion; BRAO branch retinal artery occlusion; RT retinal tear; SB scleral buckle; PPV pars plana vitrectomy; VH Vitreous hemorrhage; PRP panretinal laser photocoagulation; IVK intravitreal kenalog; VMT vitreomacular traction; MH Macular hole;  NVD neovascularization of the disc; NVE neovascularization elsewhere; AREDS age related eye disease study; ARMD age related macular degeneration; POAG primary open angle glaucoma; EBMD epithelial/anterior basement membrane dystrophy; ACIOL anterior chamber intraocular lens; IOL intraocular lens; PCIOL posterior chamber intraocular lens; Phaco/IOL phacoemulsification with intraocular lens placement; PRK photorefractive keratectomy; LASIK laser assisted in situ keratomileusis; HTN hypertension; DM diabetes mellitus; COPD chronic obstructive pulmonary disease

## 2023-09-08 ENCOUNTER — Ambulatory Visit (INDEPENDENT_AMBULATORY_CARE_PROVIDER_SITE_OTHER): Payer: Medicare PPO | Admitting: Ophthalmology

## 2023-09-08 ENCOUNTER — Encounter (INDEPENDENT_AMBULATORY_CARE_PROVIDER_SITE_OTHER): Payer: Self-pay | Admitting: Ophthalmology

## 2023-09-08 DIAGNOSIS — H40051 Ocular hypertension, right eye: Secondary | ICD-10-CM

## 2023-09-08 DIAGNOSIS — H40003 Preglaucoma, unspecified, bilateral: Secondary | ICD-10-CM | POA: Diagnosis not present

## 2023-09-08 DIAGNOSIS — H34831 Tributary (branch) retinal vein occlusion, right eye, with macular edema: Secondary | ICD-10-CM | POA: Diagnosis not present

## 2023-09-08 DIAGNOSIS — H35373 Puckering of macula, bilateral: Secondary | ICD-10-CM

## 2023-09-08 DIAGNOSIS — I10 Essential (primary) hypertension: Secondary | ICD-10-CM | POA: Diagnosis not present

## 2023-09-08 DIAGNOSIS — H35033 Hypertensive retinopathy, bilateral: Secondary | ICD-10-CM

## 2023-09-08 DIAGNOSIS — Z961 Presence of intraocular lens: Secondary | ICD-10-CM

## 2023-09-08 DIAGNOSIS — E119 Type 2 diabetes mellitus without complications: Secondary | ICD-10-CM

## 2023-09-08 MED ORDER — AFLIBERCEPT 2MG/0.05ML IZ SOLN FOR KALEIDOSCOPE
2.0000 mg | INTRAVITREAL | Status: AC | PRN
  Administered 2023-09-08: 2 mg via INTRAVITREAL

## 2023-09-16 ENCOUNTER — Other Ambulatory Visit: Payer: Self-pay

## 2023-09-16 DIAGNOSIS — I719 Aortic aneurysm of unspecified site, without rupture: Secondary | ICD-10-CM

## 2023-10-06 ENCOUNTER — Ambulatory Visit
Admission: RE | Admit: 2023-10-06 | Discharge: 2023-10-06 | Disposition: A | Discharge status: Home / Self Care | Payer: Medicare PPO | Source: Ambulatory Visit | Attending: Vascular Surgery | Admitting: Vascular Surgery

## 2023-10-06 DIAGNOSIS — I2510 Atherosclerotic heart disease of native coronary artery without angina pectoris: Secondary | ICD-10-CM | POA: Diagnosis not present

## 2023-10-06 DIAGNOSIS — I719 Aortic aneurysm of unspecified site, without rupture: Secondary | ICD-10-CM

## 2023-10-06 DIAGNOSIS — I77810 Thoracic aortic ectasia: Secondary | ICD-10-CM | POA: Diagnosis not present

## 2023-10-06 DIAGNOSIS — I700 Atherosclerosis of aorta: Secondary | ICD-10-CM | POA: Diagnosis not present

## 2023-10-06 DIAGNOSIS — N210 Calculus in bladder: Secondary | ICD-10-CM | POA: Diagnosis not present

## 2023-10-06 MED ORDER — IOPAMIDOL (ISOVUE-370) INJECTION 76%
500.0000 mL | Freq: Once | INTRAVENOUS | Status: AC | PRN
  Administered 2023-10-06: 60 mL via INTRAVENOUS

## 2023-10-12 DIAGNOSIS — Z6821 Body mass index (BMI) 21.0-21.9, adult: Secondary | ICD-10-CM | POA: Diagnosis not present

## 2023-10-12 DIAGNOSIS — E1122 Type 2 diabetes mellitus with diabetic chronic kidney disease: Secondary | ICD-10-CM | POA: Diagnosis not present

## 2023-10-12 DIAGNOSIS — N1831 Chronic kidney disease, stage 3a: Secondary | ICD-10-CM | POA: Diagnosis not present

## 2023-10-12 DIAGNOSIS — H35033 Hypertensive retinopathy, bilateral: Secondary | ICD-10-CM | POA: Diagnosis not present

## 2023-10-12 DIAGNOSIS — I700 Atherosclerosis of aorta: Secondary | ICD-10-CM | POA: Diagnosis not present

## 2023-10-12 DIAGNOSIS — E113493 Type 2 diabetes mellitus with severe nonproliferative diabetic retinopathy without macular edema, bilateral: Secondary | ICD-10-CM | POA: Diagnosis not present

## 2023-10-12 DIAGNOSIS — I10 Essential (primary) hypertension: Secondary | ICD-10-CM | POA: Diagnosis not present

## 2023-10-20 NOTE — Progress Notes (Signed)
[facility] - Clinic Note  10/27/2023    CHIEF COMPLAINT Patient presents for Retina Follow Up  HISTORY OF PRESENT ILLNESS: Alejandra Davis is a 87 y.o. female who presents to the clinic today for:   HPI     Retina Follow Up   Patient presents with  CRVO/BRVO.  In both eyes.  This started 7 weeks ago.  Duration of 7 weeks.  Since onset it is stable.  I, the attending physician,  performed the HPI with the patient and updated documentation appropriately.        Comments   7 week retina follow up BRVO OD and I'VE OD pt is reporting no vision changes noticed she denies any flashes or floaters  brim tid OU dorz/tim bid OU pt last reading is unknown or A1C she is having little trouble this morning remembering       Last edited by Rennis Chris, MD on 10/27/2023 12:38 PM.    Pt states her vision seems stable   Referring physician: Moshe Cipro, FNP [address] [place],  [place] [address]  HISTORICAL INFORMATION:   Selected notes from the MEDICAL RECORD NUMBER Referred by Dr. Joylene Igo for DM exam   CURRENT MEDICATIONS: Current Outpatient Medications (Ophthalmic Drugs)  Medication Sig   dorzolamide-timolol (COSOPT) 22.3-6.8 MG/ML ophthalmic solution Place 1 drop into both eyes 2 (two) times daily.   No current facility-administered medications for this visit. (Ophthalmic Drugs)   Current Outpatient Medications (Other)  Medication Sig   amLODipine (NORVASC) 10 MG tablet TAKE 1 TABLET(10 MG) BY MOUTH DAILY   ASPIRIN LOW DOSE 81 MG EC tablet Take 81 mg by mouth daily.   levothyroxine (SYNTHROID) 75 MCG tablet Take 1 tablet (75 mcg total) by mouth daily.   lisinopril (ZESTRIL) 2.5 MG tablet Take 1 tablet (2.5 mg total) by mouth daily.   loratadine (CLARITIN) 10 MG tablet TAKE 1 TABLET(10 MG) BY MOUTH DAILY   memantine (NAMENDA XR) 7 MG CP24 24 hr capsule Take 1 capsule (7 mg total) by mouth daily.   metoprolol tartrate (LOPRESSOR) 50 MG  tablet TAKE 1 TABLET(50 MG) BY MOUTH DAILY   No current facility-administered medications for this visit. (Other)   REVIEW OF SYSTEMS: ROS   Positive for: Skin, Genitourinary, Musculoskeletal, Endocrine, Cardiovascular, Eyes Negative for: Constitutional, Gastrointestinal, Neurological, HENT, Respiratory, Psychiatric, Allergic/Imm, Heme/Lymph Last edited by Etheleen Mayhew, COT on 10/27/2023  9:05 AM.     ALLERGIES Allergies  Allergen Reactions   Donepezil Hcl     Stomach cramps    PAST MEDICAL HISTORY Past Medical History:  Diagnosis Date   Chicken pox    Cystocele    Diabetes mellitus without complication (HCC)    Glaucoma    Hypertension    Hypertensive retinopathy    OU   Neuromuscular disorder (HCC)    Dementia    Thyroid disease    hypothyroidism   Past Surgical History:  Procedure Laterality Date   ABDOMINAL HYSTERECTOMY  1990   TAH BSO   CATARACT EXTRACTION Bilateral    Dr. Alben Spittle   COLONOSCOPY WITH PROPOFOL N/A 04/08/2021   Procedure: COLONOSCOPY WITH PROPOFOL;  Surgeon: Charlott Rakes, MD;  Location: [facility];  Service: Endoscopy;  Laterality: N/A;   EYE SURGERY Bilateral    Cat Sx OU   OOPHORECTOMY     BSO   Vaginal Bx     Papilloma   FAMILY HISTORY Family History  Problem Relation Age of  Onset   Sickle cell anemia Other    Hypertension Mother    Cancer Father        LIVER   Heart disease Brother    Cancer Brother        STOMACH   SOCIAL HISTORY Social History   Tobacco Use   Smoking status: Never   Smokeless tobacco: Never  Vaping Use   Vaping status: Never Used  Substance Use Topics   Alcohol use: No   Drug use: No       OPHTHALMIC EXAM: Base Eye Exam     Visual Acuity (Snellen - Linear)       Right Left   Dist cc 20/200 20/150   Dist ph cc 20/150 20/40    Correction: Glasses         Tonometry (Tonopen, 9:16 AM)       Right Left   Pressure 11 13         Pupils       Pupils Dark Light Shape React  APD   Right PERRL 3 2 Round Brisk None   Left PERRL 3 2 Round Brisk None         Visual Fields       Left Right    Full    Restrictions  Partial outer superior temporal, inferior temporal deficiencies         Extraocular Movement       Right Left    Full, Ortho Full, Ortho         Neuro/Psych     Oriented x3: Yes   Mood/Affect: Normal         Dilation     Both eyes: 2.5% Phenylephrine @ 9:16 AM           Slit Lamp and Fundus Exam     Slit Lamp Exam       Right Left   Lids/Lashes Dermatochalasis - upper lid, Meibomian gland dysfunction Dermatochalasis - upper lid, Meibomian gland dysfunction   Conjunctiva/Sclera Melanosis Melanosis   Cornea Arcus, trace Punctate epithelial erosions, tear film debris, 1+ [place], Well healed temporal cataract wound Arcus, Inferior 1+ Punctate epithelial erosions, well healed cataract wound   Anterior Chamber Deep and quiet Deep;  no cell or flare   Iris Round and dilated, No NVI Round and dilated, No NVI   Lens PC IOL in good position, 2+ Posterior capsular opacification Posterior chamber intraocular lens in good position, 1-2+ PCO.   Anterior Vitreous Vitreous syneresis, vitreous condensations, silicone oil bubbles Vitreous syneresis, Vitreous condensations         Fundus Exam       Right Left   Disc Sharp rim, inferior notch, 3-4+ pallor, inf. Rim thinning, attenuated vessels superiorly, fine vascular loops superiorly 2+ pallor, Sharp rim, thin inferior rim   C/D Ratio 0.85 0.75   Macula Blunted foveal reflex, persistent cystic changes superior mac; stable improvement in DBH superior mac, central exudates, fibrosis and atrophy -- stable, no frank heme Flat, good foveal reflex, mild RPE mottling and clumping, Epiretinal membrane, No heme or edema   Vessels superior BRVO with severe attenuation of ST arterioles, Tortuous attenuated, Tortuous   Periphery Attached, DBH superior hemisphere extended from BRVO- resolved  Attached, mild scattered Reticular degeneration, No heme           Refraction     Wearing Rx       Sphere Cylinder Axis Add   Right -0.75 +1.25 170 +2.50   Left -  0.75 +1.00 003 +2.50    Type: PAL         Wearing Rx #2       Sphere Cylinder Axis Add   Right -0.75 +1.25 170 +2.50   Left -0.75 +1.00 003 +2.50    Type: PAL           IMAGING AND PROCEDURES  Imaging and Procedures for 03/07/18  OCT, Retina - OU - Both Eyes       Right Eye Quality was good. Central Foveal Thickness: 322. Progression has been stable. Findings include no SRF, abnormal foveal contour, subretinal hyper-reflective material, intraretinal hyper-reflective material, epiretinal membrane, intraretinal fluid, outer retinal atrophy, vitreomacular adhesion (Persistent cystic changes, central SRHM and patchy ORA; diffuse retinal thinning -- stable, persistent VMT -- contributing to cystic changes ).   Left Eye Quality was good. Central Foveal Thickness: 249. Progression has been stable. Findings include no IRF, no SRF, abnormal foveal contour, epiretinal membrane, vitreomacular adhesion (Blunted foveal depression -- stable).   Notes *Images captured and stored on drive  Diagnosis / Impression:  ERM OU OD: BRVO w/ CME -- Persistent cystic changes, central SRHM and patchy ORA; diffuse retinal thinning -- stable, persistent VMT-- contributing to cystic changes OS: very mild ERM with blunted foveal depression -- stable; mild VMA  Clinical management:  See below  Abbreviations: NFP - Normal foveal profile. CME - cystoid macular edema. PED - pigment epithelial detachment. IRF - intraretinal fluid. SRF - subretinal fluid. EZ - ellipsoid zone. ERM - epiretinal membrane. ORA - outer retinal atrophy. ORT - outer retinal tubulation. SRHM - subretinal hyper-reflective material       Intravitreal Injection, Pharmacologic Agent - OD - Right Eye       Time Out 10/27/2023. 10:37 AM. Confirmed correct  patient, procedure, site, and patient consented.   Anesthesia Topical anesthesia was used. Anesthetic medications included Lidocaine 2%, Proparacaine 0.5%.   Procedure Preparation included 5% betadine to ocular surface, eyelid speculum. A (32g) needle was used.   Injection: 2 mg aflibercept 2 MG/0.05ML   Route: Intravitreal, Site: Right Eye   NDC: L6038910, Lot: [id number], Expiration date: 11/26/2024, Waste: 0 mL   Post-op Post injection exam found visual acuity of at least counting fingers. The patient tolerated the procedure well. There were no complications. The patient received written and verbal post procedure care education. Post injection medications were not given.            ASSESSMENT/PLAN:   ICD-10-CM   1. Branch retinal vein occlusion of right eye with macular edema  H34.8310 OCT, Retina - OU - Both Eyes    Intravitreal Injection, Pharmacologic Agent - OD - Right Eye    aflibercept (EYLEA) SOLN 2 mg    2. Diabetes mellitus type 2 without retinopathy (HCC)  E11.9     3. Epiretinal membrane (ERM) of both eyes  H35.373     4. Pseudophakia of both eyes  Z96.1     5. Ocular hypertension of right eye  H40.051     6. Glaucoma suspect of both eyes  H40.003     7. Essential hypertension  I10     8. Hypertensive retinopathy of both eyes  H35.033      1. BRVO with CME OD - lost to follow up from 02.22.22 to 11.1.22 -- re-presented with massive CME and BCVA 20/400 from 20/30  - initial OCT w/ CME superior macula - s/p IVA OD #1 (10.18.19), #2 (11.19.19), #3 (12.17.19), #4 (02.19.20), #5 (03.23.20), #  6 (04.21.20), #7 (05.27.20), #8 (07.07.20), #9 (08.25.20), #10 (9.29.20), #11 (11.17.20), #12 (12.22.20), #13 (01.27.21), #14 (03.10.21), #15 (04.21.21), #16 (05.26.21), #17 (06.30.21), #18 (08.04.21), #19 (09.01.21), #20 (10.06.21), #21 (11.17.21), #22 (12.30.21), #23 (2.22.22), #24 (11.1.22), #25 (11.29.22), #26 (01.03.23), #27 (02.14.23), #28 (04.07.23), #29  (05.19.23), #30 (06.20.23) -- IVA resistance ===========================================================  **history of worsening IRF/cystic changes at 6+ weeks interval, IVA** - s/p IVE OD #1 (08.11.23), #2 (09.08.23), #3 (10.06.23), #4 (11.06.23), #5 (12.13.23), #6 (01.24.24), #7 (03.27.24), #8 (05.08.24), #9 (06.19.24), #10 (07.31.24), #11 (09.11.24)  - BCVA OD 20/150 from 20/80 - OCT shows OD: Persistent cystic changes, central SRHM and patchy ORA; diffuse retinal thinning -- stable, persistent VMT-- contributing to cystic changes at 7 weeks  - recommend IVE OD #12 today, 10.30.24, w/ f/u at 7 wks again  - RBA of procedure discussed, questions answered - informed consent obtained and signed - see procedure note   - Eylea informed consent form signed and scanned on 08.11.23  - f/u in 7 weeks -- DFE/OCT, possible injection   2. Diabetes mellitus, type 2 without retinopathy - The incidence, risk factors for progression, natural history and treatment options for diabetic retinopathy were discussed with patient.   - The need for close monitoring of blood glucose, blood pressure, and serum lipids, avoiding cigarette or any type of tobacco, and the need for long term follow up was also discussed with patient.    3. Epiretinal membrane, OU  - relatively mild ERM OU -- blunted central foveal depression - OD with mild cystic changes 2/2 BRVO as above; OS without cystic changes or edema  - discussed findings and prognosis   - continue to monitor  4. Pseudophakia OU  - s/p CE/IOL OS 11.13.19 w/ Dr. Alben Spittle  - s/p CE/IOL OD 01.16.20 w/ Dr. Alben Spittle  - beautiful surgeries -- IOLs in perfect position  - healing well post-operatively - IRF/CME OD may have been partly due to post op CME ([place]) in addition to BRVO  5,6. Ocular hypertension / Glaucoma suspect OD>OS  - IOP 11,13  - denies any family hx of Glaucoma   - under the expert management of Dr. Zenaida Niece  - most recent notes reviewed (04.30.24  visit w/ Dr. Zenaida Niece)  - on Cosopt BID OU and Brimonidine TID OU per Dr. Zenaida Niece  7,8. Hypertensive retinopathy OU - pt presented to 6.24.2020 visit urgently for right sided headache above right eye with extension to occiput - BP at that time was 201/100 -- pt was sent to primary care clinic for urgent evaluation and meds were adjusted  - BP now under better control and headaches improved  - BP reading 11.1.22 -- 180/84 -- advised f/u with PCP  - discussed importance of tight BP control  Ophthalmic Meds Ordered this visit:  Meds ordered this encounter  Medications   aflibercept (EYLEA) SOLN 2 mg     Return in about 7 weeks (around 12/15/2023) for f/u BRVO OD, DFE, OCT.  There are no Patient Instructions on file for this visit.  This document serves as a record of services personally performed by Karie Chimera, MD, PhD. It was created on their behalf by De Blanch, an ophthalmic technician. The creation of this record is the provider's dictation and/or activities during the visit.    Electronically signed by: De Blanch, OA, 10/27/23  12:40 PM  This document serves as a record of services personally performed by Karie Chimera, MD, PhD. It was created on their behalf by  Glee Arvin. Manson Passey, OA an ophthalmic technician. The creation of this record is the provider's dictation and/or activities during the visit.    Electronically signed by: Glee Arvin. Manson Passey, OA 10/27/23 12:40 PM  Karie Chimera, M.D., Ph.D. Diseases & Surgery of the Retina and Vitreous [facility]  I have reviewed the above documentation for accuracy and completeness, and I agree with the above. Karie Chimera, M.D., Ph.D. 10/27/23 12:41 PM   Abbreviations: M myopia (nearsighted); A astigmatism; H hyperopia (farsighted); P presbyopia; Mrx spectacle prescription;  CTL contact lenses; OD right eye; OS left eye; OU both eyes  XT exotropia; ET esotropia; PEK punctate epithelial keratitis; PEE  punctate epithelial erosions; DES dry eye syndrome; MGD meibomian gland dysfunction; ATs artificial tears; PFAT's preservative free artificial tears; NSC nuclear sclerotic cataract; PSC posterior subcapsular cataract; ERM epi-retinal membrane; PVD posterior vitreous detachment; RD retinal detachment; DM diabetes mellitus; DR diabetic retinopathy; NPDR non-proliferative diabetic retinopathy; PDR proliferative diabetic retinopathy; CSME clinically significant macular edema; DME diabetic macular edema; dbh dot blot hemorrhages; CWS cotton wool spot; POAG primary open angle glaucoma; C/D cup-to-disc ratio; HVF humphrey visual field; GVF goldmann visual field; OCT optical coherence tomography; IOP intraocular pressure; BRVO Branch retinal vein occlusion; CRVO central retinal vein occlusion; CRAO central retinal artery occlusion; BRAO branch retinal artery occlusion; RT retinal tear; SB scleral buckle; PPV pars plana vitrectomy; VH Vitreous hemorrhage; PRP panretinal laser photocoagulation; IVK intravitreal kenalog; VMT vitreomacular traction; MH Macular hole;  NVD neovascularization of the disc; NVE neovascularization elsewhere; AREDS age related eye disease study; ARMD age related macular degeneration; POAG primary open angle glaucoma; EBMD epithelial/anterior basement membrane dystrophy; ACIOL anterior chamber intraocular lens; IOL intraocular lens; PCIOL posterior chamber intraocular lens; Phaco/IOL phacoemulsification with intraocular lens placement; PRK photorefractive keratectomy; LASIK laser assisted in situ keratomileusis; HTN hypertension; DM diabetes mellitus; COPD chronic obstructive pulmonary disease

## 2023-10-21 ENCOUNTER — Encounter: Payer: Self-pay | Admitting: Vascular Surgery

## 2023-10-25 NOTE — Progress Notes (Unsigned)
REASON FOR VISIT:   Follow-up of ulceration the descending thoracic aorta.  MEDICAL ISSUES:   FOCAL DISSECTION OF THE DESCENDING THORACIC AORTA: There has been no significant change in the irregularity in the descending thoracic aorta.  This was originally noted in September 2020. I have recommended a follow-up CT angio of the chest abdomen pelvis in 18 months.  HPI:   Alejandra Davis is a pleasant 87 y.o. female who I last saw on 12/18/2021.  She had presented initially with a GI bleed which prompted a CT scan of the abdomen and pelvis.  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  When I saw her last, the CT scan showed that the ulcer had progressed slightly.  She had a focal dissection of the descending thoracic aorta but no evidence of aneurysmal degeneration and no significant narrowing.  When I reviewed the films from September 2020 I did not think there been a significant change compared to her most recent study.  She had palpable pedal pulses.  I ordered a follow-up CT angio of the chest abdomen pelvis in 9 months and she comes in to discuss those results.  Since I saw her last she denies any chest pain, abdominal pain, or back pain.  Her blood pressure has been under good control.  She is not a smoker.  There have been no significant changes to her medical history.  10/26/23: Patient returns for surveillance.  We reviewed her CT scan in detail.     Past Medical History:  Diagnosis Date   Chicken pox    Cystocele    Diabetes mellitus without complication (HCC)    Glaucoma    Hypertension    Hypertensive retinopathy    OU   Neuromuscular disorder (HCC)    Dementia    Thyroid disease    hypothyroidism    Family History  Problem Relation Age of Onset   Sickle cell anemia Other    Hypertension Mother    Cancer Father        LIVER   Heart disease Brother    Cancer Brother        STOMACH    SOCIAL HISTORY: Social History   Tobacco Use    Smoking status: Never   Smokeless tobacco: Never  Substance Use Topics   Alcohol use: No    Allergies  Allergen Reactions   Donepezil Hcl     Stomach cramps     Current Outpatient Medications  Medication Sig Dispense Refill   amLODipine (NORVASC) 10 MG tablet TAKE 1 TABLET(10 MG) BY MOUTH DAILY 90 tablet 1   ASPIRIN LOW DOSE 81 MG EC tablet Take 81 mg by mouth daily.     dorzolamide-timolol (COSOPT) 22.3-6.8 MG/ML ophthalmic solution Place 1 drop into both eyes 2 (two) times daily.     levothyroxine (SYNTHROID) 75 MCG tablet Take 1 tablet (75 mcg total) by mouth daily. 90 tablet 1   lisinopril (ZESTRIL) 2.5 MG tablet Take 1 tablet (2.5 mg total) by mouth daily. 90 tablet 1   loratadine (CLARITIN) 10 MG tablet TAKE 1 TABLET(10 MG) BY MOUTH DAILY 90 tablet 1   memantine (NAMENDA XR) 7 MG CP24 24 hr capsule Take 1 capsule (7 mg total) by mouth daily. 90 capsule 3   metoprolol tartrate (LOPRESSOR) 50 MG tablet TAKE 1 TABLET(50 MG) BY MOUTH DAILY 90 tablet 1   No current facility-administered medications for this visit.   PHYSICAL EXAM:   Vitals:  10/26/23 1102  BP: (!) 148/80  Pulse: 76  Resp: 18  Temp: 98.2 F (36.8 C)  SpO2: 98%  Weight: 121 lb (54.9 kg)  Height: 5\' 5"  (1.651 m)     GENERAL: The patient is a well-nourished female, in no acute distress. The vital signs are documented above. CARDIAC: There is a regular rate and rhythm.  VASCULAR: I do not detect carotid bruits. She has palpable dorsalis pedis pulses bilaterally. PULMONARY: There is good air exchange bilaterally without wheezing or rales. ABDOMEN: Soft and non-tender with normal pitched bowel sounds.  I do not palpate any aneurysm. MUSCULOSKELETAL: There are no major deformities or cyanosis. NEUROLOGIC: No focal weakness or paresthesias are detected. SKIN: There are no ulcers or rashes noted. PSYCHIATRIC: The patient has a normal affect.  DATA:    CT angiogram of the chest, abdomen, and pelvis.   Personally reviewed.  Unchanged appearance of the descending thoracic aorta, with mild penetrating atherosclerotic ulceration/dissection  Leonie Douglas [facility] [phone]

## 2023-10-26 ENCOUNTER — Encounter: Payer: Self-pay | Admitting: Vascular Surgery

## 2023-10-26 ENCOUNTER — Ambulatory Visit: Payer: Medicare PPO | Admitting: Vascular Surgery

## 2023-10-26 VITALS — BP 148/80 | HR 76 | Temp 98.2°F | Resp 18 | Ht 65.0 in | Wt 121.0 lb

## 2023-10-26 DIAGNOSIS — I719 Aortic aneurysm of unspecified site, without rupture: Secondary | ICD-10-CM

## 2023-10-27 ENCOUNTER — Ambulatory Visit (INDEPENDENT_AMBULATORY_CARE_PROVIDER_SITE_OTHER): Payer: Medicare PPO | Admitting: Ophthalmology

## 2023-10-27 ENCOUNTER — Encounter (INDEPENDENT_AMBULATORY_CARE_PROVIDER_SITE_OTHER): Payer: Self-pay | Admitting: Ophthalmology

## 2023-10-27 DIAGNOSIS — E119 Type 2 diabetes mellitus without complications: Secondary | ICD-10-CM

## 2023-10-27 DIAGNOSIS — H34831 Tributary (branch) retinal vein occlusion, right eye, with macular edema: Secondary | ICD-10-CM | POA: Diagnosis not present

## 2023-10-27 DIAGNOSIS — H40003 Preglaucoma, unspecified, bilateral: Secondary | ICD-10-CM | POA: Diagnosis not present

## 2023-10-27 DIAGNOSIS — H40051 Ocular hypertension, right eye: Secondary | ICD-10-CM

## 2023-10-27 DIAGNOSIS — I10 Essential (primary) hypertension: Secondary | ICD-10-CM

## 2023-10-27 DIAGNOSIS — Z961 Presence of intraocular lens: Secondary | ICD-10-CM

## 2023-10-27 DIAGNOSIS — H35373 Puckering of macula, bilateral: Secondary | ICD-10-CM | POA: Diagnosis not present

## 2023-10-27 DIAGNOSIS — H35033 Hypertensive retinopathy, bilateral: Secondary | ICD-10-CM | POA: Diagnosis not present

## 2023-10-27 MED ORDER — AFLIBERCEPT 2MG/0.05ML IZ SOLN FOR KALEIDOSCOPE
2.0000 mg | INTRAVITREAL | Status: AC | PRN
  Administered 2023-10-27: 2 mg via INTRAVITREAL

## 2023-11-01 NOTE — Progress Notes (Unsigned)
No chief complaint on file.   HISTORY OF PRESENT ILLNESS:  11/01/23 ALL:  Amberlyn returns for follow up for AD. She was last seen 03/2023 and doing well on memantine XR 7mg  daily. Since,   04/14/2023 ALL:  Khristine returns for follow up for AD. She continues memantine XR 7mg  daily. Unable to tolerate higher dose. She presents with her friend who drove her to appt but waits in lobby. Mrs Ozga feels she is doing well. She reports taking medicaitons as listed on MAR, however, when specifically asked about memantine she is not able to confirm she is taking. She reports being able to perform ADLs. She lives alone. She has a home care aide 10-5 W-F and 10-2 on M-T. Her daughter and son rotate every other weekend. She is sleeping well and appetite seems normal. She loves to do crossword and word finding puzzles. She was going to [facility] but hasn't been recently. She does not drive.  10/05/2022 ALL: AHYANA SKILLIN is a 87 y.o. female here today for follow up for AD. She was last seen by Dr Frances Furbish 02/2022 and memantine XR increased to 14mg  daily. Unable to tolerate donepezil in the past. She returns with her daughter, Campbell Lerner, who aids in history. [name] reports some progression in short term memory loss. Katonya feels that she is doing fairly well. Campbell Lerner was hesitant to increase menantine dose. Jozy has continued 7mg  daily and tolerating well. She continues to live alone. She is independent in ADLs. She is able to manage medications without difficulty. She has a home care aide 10-5 W-F and 10-2 on M-T. Her daughter and son rotate every other weekend. She is sleeping well and appetite seems normal. She loves to do crossword and word finding puzzles. She was going to [facility] but hasn't been recently. She does not drive.   HISTORY (copied from Dr Teofilo Pod previous note)  Ms. Clements is an 87 year old right-handed woman with an underlying medical history of diabetes, hypertension, branch retinal vein occlusion, macular edema,  hearing loss, and hypothyroidism, who presents for follow-up consultation of her memory loss.  The patient is accompanied by her Aide, Caroll, today, and presents after a long gap of over 2 years.  I last saw her on 08/29/2019, at which time she was on Aricept 5 mg daily, had occasional GI complaints including stomach cramping and nausea, no vomiting or diarrhea.  She had weight loss.  She was advised to follow-up with her primary care to make sure there is no medical reason for her weight loss.  She was advised to increase her donepezil to 10 mg daily.  I had previously talked her about pursuing a sleep study but she did not wish to pursue this.   Her daughter called in the interim in September 2020 reporting that she significant side effects from the donepezil and she was therefore advised to taper off of it.   The patient had a follow-up appointment with Ulyssa Walthour, NP on 11/28/2019, at which time she was accompanied by her niece.  Her side effects had resolved after stopping the donepezil.  Her MMSE was 23 at the time.  The addition of Namenda was discussed but it was not started due to her family being concerned about side effects.    She did not return for follow-up since then.   She had an interim evaluation as per PCP request, with Dr. Clayborn Heron, neuropsychologist, in February 2022 and his impression was <<Diagnostic Impressions: Alzheimer's disease, late onset Cerebrovascular  small vessel disease>>. She had a feedback appointment with Dr. Roseanne Reno in February 2022.   She was advised no longer to drive.   Today, 03/19/2022: She reports feeling okay, she is not able to provide much in the way of details of her history.  She denies any pain, any depression or stress.  She has not fallen.  Per Caroll she does her own laundry and also cooks some.  Caroll is there from 10 AM to 2 PM daily, Mondays through Fridays, there is another caretaker that is there from 4 PM to 7 PM daily, nobody stays with her  overnight.  There have been no reports of falls, no wandering, no behavioral escalations, sometimes patient gets frustrated at times if she does not remember something.  No anger outbursts.  She tries to hydrate well, drinks 8 ounce bottles of water, typically 3 bottles well Caroll is there.  She has not driven a car in the months as far as her caretaker knows.  Patient reports that she drove her car to the grocery store in the afternoon some 2 weeks ago. This is not confirmed by the caretaker.  I reviewed her office visit note with her primary care on 11/18/2021.  She had blood work at the time which I reviewed: BUN was elevated at 28, creatinine elevated at 1.37.  She had a total cholesterol of 216, thyroid function was benign, LDL 112.  She was started on memantine ER 7 mg strength in late November 2022 as I understand.  She seems to tolerate this well, her caretaker reports occasional diarrhea.   REVIEW OF SYSTEMS: Out of a complete 14 system review of symptoms, the patient complains only of the following symptoms,memory loss, and all other reviewed systems are negative.   ALLERGIES: Allergies  Allergen Reactions   Donepezil Hcl     Stomach cramps      HOME MEDICATIONS: Outpatient Medications Prior to Visit  Medication Sig Dispense Refill   amLODipine (NORVASC) 10 MG tablet TAKE 1 TABLET(10 MG) BY MOUTH DAILY 90 tablet 1   ASPIRIN LOW DOSE 81 MG EC tablet Take 81 mg by mouth daily.     dorzolamide-timolol (COSOPT) 22.3-6.8 MG/ML ophthalmic solution Place 1 drop into both eyes 2 (two) times daily.     levothyroxine (SYNTHROID) 75 MCG tablet Take 1 tablet (75 mcg total) by mouth daily. 90 tablet 1   lisinopril (ZESTRIL) 2.5 MG tablet Take 1 tablet (2.5 mg total) by mouth daily. 90 tablet 1   loratadine (CLARITIN) 10 MG tablet TAKE 1 TABLET(10 MG) BY MOUTH DAILY 90 tablet 1   memantine (NAMENDA XR) 7 MG CP24 24 hr capsule Take 1 capsule (7 mg total) by mouth daily. 90 capsule 3    metoprolol tartrate (LOPRESSOR) 50 MG tablet TAKE 1 TABLET(50 MG) BY MOUTH DAILY 90 tablet 1   No facility-administered medications prior to visit.     PAST MEDICAL HISTORY: Past Medical History:  Diagnosis Date   Chicken pox    Cystocele    Diabetes mellitus without complication (HCC)    Glaucoma    Hypertension    Hypertensive retinopathy    OU   Neuromuscular disorder (HCC)    Dementia    Thyroid disease    hypothyroidism     PAST SURGICAL HISTORY: Past Surgical History:  Procedure Laterality Date   ABDOMINAL HYSTERECTOMY  1990   TAH BSO   CATARACT EXTRACTION Bilateral    Dr. Alben Spittle   COLONOSCOPY WITH PROPOFOL N/A 04/08/2021  Procedure: COLONOSCOPY WITH PROPOFOL;  Surgeon: Charlott Rakes, MD;  Location: [facility];  Service: Endoscopy;  Laterality: N/A;   EYE SURGERY Bilateral    Cat Sx OU   OOPHORECTOMY     BSO   Vaginal Bx     Papilloma     FAMILY HISTORY: Family History  Problem Relation Age of Onset   Sickle cell anemia Other    Hypertension Mother    Cancer Father        LIVER   Heart disease Brother    Cancer Brother        STOMACH     SOCIAL HISTORY: Social History   Socioeconomic History   Marital status: Divorced    Spouse name: Not on file   Number of children: Not on file   Years of education: Not on file   Highest education level: Not on file  Occupational History   Not on file  Tobacco Use   Smoking status: Never   Smokeless tobacco: Never  Vaping Use   Vaping status: Never Used  Substance and Sexual Activity   Alcohol use: No   Drug use: No   Sexual activity: Not Currently    Birth control/protection: Surgical  Other Topics Concern   Not on file  Social History Narrative   Social History      Diet? n/a      Do you drink/eat things with caffeine? Yes   Marital status?      Divorced                               What year were you married? 1962      Do you live in a house, apartment, assisted living, condo,  trailer, etc.? house      Is it one or more stories? One story      How many persons live in your home? 1      Do you have any pets in your home? (please list) none       Highest level of education completed? 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               sometimes                       Type & how often? Walking       Advanced Directives      Do you have a living will? No       Do you have a DNR form?                                  If not, do you want to discuss one?  No       Do you have signed POA/HPOA for forms?       Functional Status      Do you have difficulty bathing or dressing yourself? No       Do you have difficulty preparing food or eating? No       Do you have difficulty managing your medications? No       Do you have difficulty managing your finances? No       Do you  have difficulty affording your medications? No       Social Determinants of Corporate investment banker Strain: Not on file  Food Insecurity: Not on file  Transportation Needs: Not on file  Physical Activity: Not on file  Stress: Not on file  Social Connections: Not on file  Intimate Partner Violence: Not on file     PHYSICAL EXAM  There were no vitals filed for this visit.   There is no height or weight on file to calculate BMI.  Generalized: Well developed, in no acute distress  Cardiology: normal rate and rhythm, no murmur auscultated  Respiratory: clear to auscultation bilaterally    Neurological examination  Mentation: Alert, not oriented to time but is oriented to place and some history taking. Follows all commands speech and language fluent Cranial nerve II-XII: Pupils were equal round reactive to light. Extraocular movements were full, visual field were full on confrontational test. Facial sensation and strength were normal. Head  turning and shoulder shrug  were normal and symmetric. Motor: The motor testing reveals 5 over 5 strength of bilateral upper ext, 4+/5 bilateral lowers. Good symmetric motor tone is noted throughout.  Sensory: Sensory testing is intact to soft touch on all 4 extremities. No evidence of extinction is noted.  Coordination: Cerebellar testing reveals good finger-nose-finger and heel-to-shin bilaterally.  Gait and station: Gait is normal.    DIAGNOSTIC DATA (LABS, IMAGING, TESTING) - I reviewed patient records, labs, notes, testing and imaging myself where available.  Lab Results  Component Value Date   WBC 6.6 11/06/2022   HGB 13.9 11/06/2022   HCT 43.2 11/06/2022   MCV 81.5 11/06/2022   PLT 261 11/06/2022      Component Value Date/Time   NA 142 11/06/2022 1520   K 3.9 11/06/2022 1520   CL 104 11/06/2022 1520   CO2 24 11/06/2022 1520   GLUCOSE 85 11/06/2022 1520   BUN 24 (H) 11/06/2022 1520   CREATININE 1.29 (H) 11/06/2022 1520   CREATININE 1.52 (H) 07/21/2021 0808   CALCIUM 10.5 (H) 11/06/2022 1520   PROT 7.4 07/22/2021 1932   ALBUMIN 3.8 07/22/2021 1932   AST 20 07/22/2021 1932   ALT 13 07/22/2021 1932   ALKPHOS 47 07/22/2021 1932   BILITOT 0.2 (L) 07/22/2021 1932   GFRNONAA 41 (L) 11/06/2022 1520   GFRNONAA 33 (L) 04/22/2021 1056   GFRAA 38 (L) 04/22/2021 1056   Lab Results  Component Value Date   CHOL 188 07/21/2021   HDL 74 07/21/2021   LDLCALC 97 07/21/2021   TRIG 80 07/21/2021   CHOLHDL 2.5 07/21/2021   Lab Results  Component Value Date   HGBA1C 5.9 (H) 07/21/2021   No results found for: "VITAMINB12" Lab Results  Component Value Date   TSH 1.566 07/22/2021       04/14/2023   10:45 AM 10/05/2022    9:15 AM 03/19/2022   10:50 AM  MMSE - Mini Mental State Exam  Orientation to time 0 2 0  Orientation to Place 5 5 4   Registration 3 3 3   Attention/ Calculation 2 2 5   Recall 0 0 0  Language- name 2 objects 2 2 2   Language- repeat 1 1 1   Language- follow  3 step command 3 3 3   Language- read & follow direction 1 1 1   Write a sentence 1 1 1   Copy design 1 1 1   Total score 19 21 21          No data to  display           ASSESSMENT AND PLAN  87 y.o. year old female  has a past medical history of Chicken pox, Cystocele, Diabetes mellitus without complication (HCC), Glaucoma, Hypertension, Hypertensive retinopathy, Neuromuscular disorder (HCC), and Thyroid disease. here with    No diagnosis found.  RINOA GARRAMONE is doing fairly well, today. She is alone, today. She remains independent with ADLs but does have a health care aid that comes regularly and her children check on her on the weekends. She was unable to tolerate increased dose of memantine. She will continue memantine XR to 7mg  daily. She will call for any concerns. Memory compensation strategies reviewed. Healthy lifestyle habits encouraged. She will follow up with PCP as directed. She will return to see me in 6-12 months, sooner if needed. She verbalizes understanding and agreement with this plan.   No orders of the defined types were placed in this encounter.    No orders of the defined types were placed in this encounter.   I spent 30 minutes of face-to-face and non-face-to-face time with patient.  This included previsit chart review, lab review, study review, order entry, electronic health record documentation, patient education.   Shawnie Dapper, MSN, FNP-C 11/01/2023, 9:55 AM  [facility] [address], [address] [place], [place] [address] [phone]

## 2023-11-01 NOTE — Patient Instructions (Signed)
Below is our plan:  We will continue donepezil 10mg and memantine 10mg daily  Please make sure you are staying well hydrated. I recommend 50-60 ounces daily. Well balanced diet and regular exercise encouraged. Consistent sleep schedule with 6-8 hours recommended.   Please continue follow up with care team as directed.   Follow up with me in 1 year   You may receive a survey regarding today's visit. I encourage you to leave honest feed back as I do use this information to improve patient care. Thank you for seeing me today!   Management of Memory Problems   There are some general things you can do to help manage your memory problems.  Your memory may not in fact recover, but by using techniques and strategies you will be able to manage your memory difficulties better.   1)  Establish a routine. Try to establish and then stick to a regular routine.  By doing this, you will get used to what to expect and you will reduce the need to rely on your memory.  Also, try to do things at the same time of day, such as taking your medication or checking your calendar first thing in the morning. Think about think that you can do as a part of a regular routine and make a list.  Then enter them into a daily planner to remind you.  This will help you establish a routine.   2)  Organize your environment. Organize your environment so that it is uncluttered.  Decrease visual stimulation.  Place everyday items such as keys or cell phone in the same place every day (ie.  Basket next to front door) Use post it notes with a brief message to yourself (ie. Turn off light, lock the door) Use labels to indicate where things go (ie. Which cupboards are for food, dishes, etc.) Keep a notepad and pen by the telephone to take messages   3)  Memory Aids A diary or journal/notebook/daily planner Making a list (shopping list, chore list, to do list that needs to be done) Using an alarm as a reminder (kitchen timer or cell  phone alarm) Using cell phone to store information (Notes, Calendar, Reminders) Calendar/White board placed in a prominent position Post-it notes   In order for memory aids to be useful, you need to have good habits.  It's no good remembering to make a note in your journal if you don't remember to look in it.  Try setting aside a certain time of day to look in journal.   4)  Improving mood and managing fatigue. There may be other factors that contribute to memory difficulties.  Factors, such as anxiety, depression and tiredness can affect memory. Regular gentle exercise can help improve your mood and give you more energy. Exercise: there are short videos created by the National Institute on Health specially for older adults: https://bit.ly/2I30q97.  Mediterranean diet: which emphasizes fruits, vegetables, whole grains, legumes, fish, and other seafood; unsaturated fats such as olive oils; and low amounts of red meat, eggs, and sweets. A variation of this, called MIND (Mediterranean-DASH Intervention for Neurodegenerative Delay) incorporates the DASH (Dietary Approaches to Stop Hypertension) diet, which has been shown to lower high blood pressure, a risk factor for Alzheimer's disease. More information at: https://www.nia.nih.gov/health/what-do-we-know-about-diet-and-prevention-alzheimers-disease.  Aerobic exercise that improve heart health is also good for the mind.  National Institute on Aging have short videos for exercises that you can do at home: www.nia.nih.gov/Go4Life Simple relaxation techniques may help relieve   symptoms of anxiety Try to get back to completing activities or hobbies you enjoyed doing in the past. Learn to pace yourself through activities to decrease fatigue. Find out about some local support groups where you can share experiences with others. Try and achieve 7-8 hours of sleep at night.   Resources for Family/Caregiver  Online caregiver support groups can be found at  alz.org or call Alzheimer's Association's 24/7 hotline: 800.272.3900. [facility] offers in-person, virtual support groups and individual counseling for both care partners and persons with memory loss. Call for more information at [phone].   Advanced care plan: there are two types of Power of Attorney: healthcare and durable. Healthcare POA is a designated person to make healthcare decisions on your behalf if you were too sick to make them yourself. This person can be selected and documented by your physician. Durable POA has to be set up with a lawyer who takes charge of your finances and estate if you were too sick or cognitively impaired to manage your finances accurately. You can find a local Elder Law lawyer here: https://www.naela.org/.  Check out www.planyourlifespan.org, which will help you plan before a crisis and decide who will take care of life considerations in a circumstance where you may not be able to speak for yourself.   Helpful books (available on Amazon or your local bookstore):  By Dr. Ed Shaw: Keeping Love Alive as Memories Fade: The 5 Love Languages and the Alzheimer's Journey Sep 28, 2015 The Dementia Care Partner's Workbook: A Guide for Understanding, Education, and Hope Paperback - May 28, 2018.  Both available for less than $15.   "Coping with behavior change in dementia: a family caregiver's guide" by Beth Spencer & Laurie White "A Caregiver's Guide to Dementia: Using Activities and Other Strategies to Prevent, Reduce and Manage Behavioral Symptoms" by Laura N. Gitlin and Catherine Piersol.  "Creating Moments of Joy for the Person with Alzheimer's or Dementia" 4th edition by Jolene Brackrey  Caregiver videos on common behaviors related to dementia: https://www.uclahealth.org/dementia/caregiver-education-videos  [facility] Caregiver Portal: free to sign up, links to local resources: [email or web address]/login  

## 2023-11-02 ENCOUNTER — Encounter: Payer: Self-pay | Admitting: Family Medicine

## 2023-11-03 ENCOUNTER — Encounter: Payer: Self-pay | Admitting: Family Medicine

## 2023-11-03 ENCOUNTER — Ambulatory Visit (INDEPENDENT_AMBULATORY_CARE_PROVIDER_SITE_OTHER): Payer: Medicare PPO | Admitting: Family Medicine

## 2023-11-03 VITALS — BP 137/76 | HR 71 | Ht 65.0 in | Wt 120.5 lb

## 2023-11-03 DIAGNOSIS — F0280 Dementia in other diseases classified elsewhere without behavioral disturbance: Secondary | ICD-10-CM

## 2023-11-03 DIAGNOSIS — G301 Alzheimer's disease with late onset: Secondary | ICD-10-CM

## 2023-11-03 MED ORDER — MEMANTINE HCL ER 7 MG PO CP24: 7.0000 mg | ORAL_CAPSULE | Freq: Every day | ORAL | 3 refills | Status: DC

## 2023-11-15 DIAGNOSIS — N1832 Chronic kidney disease, stage 3b: Secondary | ICD-10-CM | POA: Diagnosis not present

## 2023-11-15 DIAGNOSIS — Z6821 Body mass index (BMI) 21.0-21.9, adult: Secondary | ICD-10-CM | POA: Diagnosis not present

## 2023-11-15 DIAGNOSIS — E039 Hypothyroidism, unspecified: Secondary | ICD-10-CM | POA: Diagnosis not present

## 2023-11-15 DIAGNOSIS — Z23 Encounter for immunization: Secondary | ICD-10-CM | POA: Diagnosis not present

## 2023-11-15 DIAGNOSIS — E1122 Type 2 diabetes mellitus with diabetic chronic kidney disease: Secondary | ICD-10-CM | POA: Diagnosis not present

## 2023-11-15 DIAGNOSIS — L659 Nonscarring hair loss, unspecified: Secondary | ICD-10-CM | POA: Diagnosis not present

## 2023-11-15 DIAGNOSIS — I700 Atherosclerosis of aorta: Secondary | ICD-10-CM | POA: Diagnosis not present

## 2023-11-15 DIAGNOSIS — Z0000 Encounter for general adult medical examination without abnormal findings: Secondary | ICD-10-CM | POA: Diagnosis not present

## 2023-11-15 DIAGNOSIS — I10 Essential (primary) hypertension: Secondary | ICD-10-CM | POA: Diagnosis not present

## 2023-11-22 DIAGNOSIS — L659 Nonscarring hair loss, unspecified: Secondary | ICD-10-CM | POA: Diagnosis not present

## 2023-11-22 DIAGNOSIS — L810 Postinflammatory hyperpigmentation: Secondary | ICD-10-CM | POA: Diagnosis not present

## 2023-11-22 DIAGNOSIS — L2089 Other atopic dermatitis: Secondary | ICD-10-CM | POA: Diagnosis not present

## 2023-11-22 DIAGNOSIS — L218 Other seborrheic dermatitis: Secondary | ICD-10-CM | POA: Diagnosis not present

## 2023-12-09 NOTE — Progress Notes (Addendum)
[facility] - Clinic Note  12/15/2023    CHIEF COMPLAINT Patient presents for Retina Follow Up  HISTORY OF PRESENT ILLNESS: Alejandra Davis is a 87 y.o. female who presents to the clinic today for:   HPI     Retina Follow Up   Patient presents with  CRVO/BRVO.  In right eye.  Severity is moderate.  Duration of 7 weeks.  Since onset it is stable.  I, the attending physician,  performed the HPI with the patient and updated documentation appropriately.        Comments   Pt here for 7 wk ret f/u BRVO OD. Pt states VA is stable, no changes.       Last edited by Rennis Chris, MD on 12/15/2023 12:48 PM.     Referring physician: Moshe Cipro, FNP [address] [place],  [place] [address]  HISTORICAL INFORMATION:   Selected notes from the MEDICAL RECORD NUMBER Referred by Dr. Joylene Igo for DM exam   CURRENT MEDICATIONS: Current Outpatient Medications (Ophthalmic Drugs)  Medication Sig   dorzolamide-timolol (COSOPT) 22.3-6.8 MG/ML ophthalmic solution Place 1 drop into both eyes 2 (two) times daily.   No current facility-administered medications for this visit. (Ophthalmic Drugs)   Current Outpatient Medications (Other)  Medication Sig   amLODipine (NORVASC) 10 MG tablet TAKE 1 TABLET(10 MG) BY MOUTH DAILY   ASPIRIN LOW DOSE 81 MG EC tablet Take 81 mg by mouth daily.   levothyroxine (SYNTHROID) 75 MCG tablet Take 1 tablet (75 mcg total) by mouth daily.   lisinopril (ZESTRIL) 2.5 MG tablet Take 1 tablet (2.5 mg total) by mouth daily.   loratadine (CLARITIN) 10 MG tablet TAKE 1 TABLET(10 MG) BY MOUTH DAILY   memantine (NAMENDA XR) 7 MG CP24 24 hr capsule Take 1 capsule (7 mg total) by mouth daily.   metoprolol tartrate (LOPRESSOR) 50 MG tablet TAKE 1 TABLET(50 MG) BY MOUTH DAILY   No current facility-administered medications for this visit. (Other)   REVIEW OF SYSTEMS: ROS   Positive for: Skin, Genitourinary, Musculoskeletal,  Endocrine, Cardiovascular, Eyes Negative for: Constitutional, Gastrointestinal, Neurological, HENT, Respiratory, Psychiatric, Allergic/Imm, Heme/Lymph Last edited by Thompson Grayer, COT on 12/15/2023  9:12 AM.      ALLERGIES Allergies  Allergen Reactions   Donepezil Hcl     Stomach cramps    PAST MEDICAL HISTORY Past Medical History:  Diagnosis Date   Chicken pox    Cystocele    Diabetes mellitus without complication (HCC)    Glaucoma    Hypertension    Hypertensive retinopathy    OU   Neuromuscular disorder (HCC)    Dementia    Thyroid disease    hypothyroidism   Past Surgical History:  Procedure Laterality Date   ABDOMINAL HYSTERECTOMY  1990   TAH BSO   CATARACT EXTRACTION Bilateral    Dr. Alben Spittle   COLONOSCOPY WITH PROPOFOL N/A 04/08/2021   Procedure: COLONOSCOPY WITH PROPOFOL;  Surgeon: Charlott Rakes, MD;  Location: [facility];  Service: Endoscopy;  Laterality: N/A;   EYE SURGERY Bilateral    Cat Sx OU   OOPHORECTOMY     BSO   Vaginal Bx     Papilloma   FAMILY HISTORY Family History  Problem Relation Age of Onset   Sickle cell anemia Other    Hypertension Mother    Cancer Father        LIVER   Heart disease Brother    Cancer Brother  STOMACH   SOCIAL HISTORY Social History   Tobacco Use   Smoking status: Never   Smokeless tobacco: Never  Vaping Use   Vaping status: Never Used  Substance Use Topics   Alcohol use: No   Drug use: No       OPHTHALMIC EXAM: Base Eye Exam     Visual Acuity (Snellen - Linear)       Right Left   Dist [place] 20/200 20/50 -2   Dist ph [place] 20/150 -1 20/30  Pt forgot specs MS        Tonometry (Tonopen, 9:20 AM)       Right Left   Pressure 14 17         Pupils       Pupils Dark Light Shape React APD   Right PERRL 3 2 Round Brisk None   Left PERRL 3 2 Round Brisk None         Visual Fields (Counting fingers)       Left Right    Full    Restrictions  Partial outer superior temporal,  inferior temporal deficiencies         Extraocular Movement       Right Left    Full, Ortho Full, Ortho         Neuro/Psych     Oriented x3: Yes   Mood/Affect: Normal         Dilation     Both eyes: 1.0% Mydriacyl, 2.5% Phenylephrine @ 9:20 AM           Slit Lamp and Fundus Exam     Slit Lamp Exam       Right Left   Lids/Lashes Dermatochalasis - upper lid, Meibomian gland dysfunction Dermatochalasis - upper lid, Meibomian gland dysfunction   Conjunctiva/Sclera Melanosis Melanosis   Cornea Arcus, trace Punctate epithelial erosions, tear film debris, 1+ [place], Well healed temporal cataract wound Arcus, Inferior 1+ Punctate epithelial erosions, well healed cataract wound   Anterior Chamber Deep and quiet Deep;  no cell or flare   Iris Round and dilated, No NVI Round and dilated, No NVI   Lens PC IOL in good position, 2+ Posterior capsular opacification Posterior chamber intraocular lens in good position, 1-2+ PCO.   Anterior Vitreous Vitreous syneresis, vitreous condensations, silicone oil bubbles Vitreous syneresis, Vitreous condensations         Fundus Exam       Right Left   Disc Sharp rim, inferior notch, 3-4+ pallor, inf. Rim thinning, attenuated vessels superiorly, fine vascular loops superiorly 2+ pallor, Sharp rim, thin inferior rim   C/D Ratio 0.85 0.75   Macula Blunted foveal reflex, persistent cystic changes superior mac; stable improvement in DBH superior mac, central exudates, fibrosis and atrophy -- stable, no frank heme Flat, good foveal reflex, mild RPE mottling and clumping, Epiretinal membrane, No heme or edema   Vessels superior BRVO with severe attenuation of ST arterioles, Tortuous attenuated, Tortuous   Periphery Attached, DBH superior hemisphere extended from BRVO -- stably resolved, No heme Attached, mild scattered Reticular degeneration, No heme           IMAGING AND PROCEDURES  Imaging and Procedures for 03/07/18  OCT, Retina -  OU - Both Eyes       Right Eye Quality was good. Central Foveal Thickness: 304. Progression has improved. Findings include no SRF, abnormal foveal contour, subretinal hyper-reflective material, intraretinal hyper-reflective material, epiretinal membrane, intraretinal fluid, outer retinal atrophy, vitreomacular adhesion (Central cystic changes --  slightly improved, central SRHM and patchy ORA; diffuse retinal thinning -- stable, persistent VMT -- slightly improved).   Left Eye Quality was good. Central Foveal Thickness: 249. Progression has been stable. Findings include no IRF, no SRF, abnormal foveal contour, epiretinal membrane, vitreomacular adhesion (Blunted foveal depression -- stable).   Notes *Images captured and stored on drive  Diagnosis / Impression:  ERM OU OD: BRVO w/ CME -- Central cystic changes -- slightly improved, central SRHM and patchy ORA; diffuse retinal thinning -- stable, persistent VMT -- slightly improved OS: very mild ERM with blunted foveal depression -- stable; mild VMA  Clinical management:  See below  Abbreviations: NFP - Normal foveal profile. CME - cystoid macular edema. PED - pigment epithelial detachment. IRF - intraretinal fluid. SRF - subretinal fluid. EZ - ellipsoid zone. ERM - epiretinal membrane. ORA - outer retinal atrophy. ORT - outer retinal tubulation. SRHM - subretinal hyper-reflective material       Intravitreal Injection, Pharmacologic Agent - OD - Right Eye       Time Out 12/15/2023. 10:09 AM. Confirmed correct patient, procedure, site, and patient consented.   Anesthesia Topical anesthesia was used. Anesthetic medications included Lidocaine 2%, Proparacaine 0.5%.   Procedure Preparation included 5% betadine to ocular surface, eyelid speculum. A (32g) needle was used.   Injection: 2 mg aflibercept 2 MG/0.05ML   Route: Intravitreal, Site: Right Eye   NDC: L6038910, Lot: [id number], Expiration date: 05/27/2024, Waste: 0 mL    Post-op Post injection exam found visual acuity of at least counting fingers. The patient tolerated the procedure well. There were no complications. The patient received written and verbal post procedure care education. Post injection medications were not given.            ASSESSMENT/PLAN:   ICD-10-CM   1. Branch retinal vein occlusion of right eye with macular edema  H34.8310 OCT, Retina - OU - Both Eyes    Intravitreal Injection, Pharmacologic Agent - OD - Right Eye    aflibercept (EYLEA) SOLN 2 mg    2. Diabetes mellitus type 2 without retinopathy (HCC)  E11.9     3. Epiretinal membrane (ERM) of both eyes  H35.373     4. Pseudophakia of both eyes  Z96.1     5. Ocular hypertension of right eye  H40.051     6. Glaucoma suspect of both eyes  H40.003     7. Essential hypertension  I10     8. Hypertensive retinopathy of both eyes  H35.033       1. BRVO with CME OD - lost to follow up from 02.22.22 to 11.1.22 -- re-presented with massive CME and BCVA 20/400 from 20/30  - initial OCT w/ CME superior macula - s/p IVA OD #1 (10.18.19), #2 (11.19.19), #3 (12.17.19), #4 (02.19.20), #5 (03.23.20), #6 (04.21.20), #7 (05.27.20), #8 (07.07.20), #9 (08.25.20), #10 (9.29.20), #11 (11.17.20), #12 (12.22.20), #13 (01.27.21), #14 (03.10.21), #15 (04.21.21), #16 (05.26.21), #17 (06.30.21), #18 (08.04.21), #19 (09.01.21), #20 (10.06.21), #21 (11.17.21), #22 (12.30.21), #23 (2.22.22), #24 (11.1.22), #25 (11.29.22), #26 (01.03.23), #27 (02.14.23), #28 (04.07.23), #29 (05.19.23), #30 (06.20.23) -- IVA resistance ===========================================================  **history of worsening IRF/cystic changes at 6+ weeks interval, IVA** - s/p IVE OD #1 (08.11.23), #2 (09.08.23), #3 (10.06.23), #4 (11.06.23), #5 (12.13.23), #6 (01.24.24), #7 (03.27.24), #8 (05.08.24), #9 (06.19.24), #10 (07.31.24), #11 (09.11.24), #12 (10.30.24)  - BCVA OD stable at 20/150  - OCT shows BRVO w/ CME -- Central  cystic changes -- slightly improved, central SRHM and  patchy ORA; diffuse retinal thinning -- stable, persistent VMT -- slightly improved at 7 weeks  - recommend IVE OD #13 today, 12.18.24, w/ f/u extended to 8 wks   - RBA of procedure discussed, questions answered - informed consent obtained and signed - see procedure note   - Eylea informed consent form signed and scanned on 08.11.23  - f/u in 8 weeks -- DFE/OCT, possible injection   2. Diabetes mellitus, type 2 without retinopathy - The incidence, risk factors for progression, natural history and treatment options for diabetic retinopathy were discussed with patient.   - The need for close monitoring of blood glucose, blood pressure, and serum lipids, avoiding cigarette or any type of tobacco, and the need for long term follow up was also discussed with patient.    3. Epiretinal membrane, OU  - relatively mild ERM OU -- blunted central foveal depression - OD with mild cystic changes 2/2 BRVO as above; OS without cystic changes or edema  - discussed findings and prognosis   - continue to monitor  4. Pseudophakia OU  - s/p CE/IOL OS 11.13.19 w/ Dr. Alben Spittle  - s/p CE/IOL OD 01.16.20 w/ Dr. Alben Spittle  - beautiful surgeries -- IOLs in perfect position  - healing well post-operatively - IRF/CME OD may have been partly due to post op CME ([place]) in addition to BRVO  5,6. Ocular hypertension / Glaucoma suspect OD>OS  - IOP 14,17  - denies any family hx of Glaucoma   - under the expert management of Dr. Zenaida Niece  - most recent notes reviewed (04.30.24 visit w/ Dr. Zenaida Niece)  - on Cosopt BID OU and Brimonidine TID OU per Dr. Zenaida Niece  7,8. Hypertensive retinopathy OU - pt presented to 6.24.2020 visit urgently for right sided headache above right eye with extension to occiput - BP at that time was 201/100 -- pt was sent to primary care clinic for urgent evaluation and meds were adjusted  - BP now under better control and headaches improved  - BP  reading 11.1.22 -- 180/84 -- advised f/u with PCP  - discussed importance of tight BP control  Ophthalmic Meds Ordered this visit:  Meds ordered this encounter  Medications   aflibercept (EYLEA) SOLN 2 mg     Return in about 8 weeks (around 02/09/2024) for f/u BRVO OD, DFE, OCT, Possible Injxn.  There are no Patient Instructions on file for this visit.  This document serves as a record of services personally performed by Karie Chimera, MD, PhD. It was created on their behalf by De Blanch, an ophthalmic technician. The creation of this record is the provider's dictation and/or activities during the visit.    Electronically signed by: De Blanch, OA, 12/15/23  12:54 PM  This document serves as a record of services personally performed by Karie Chimera, MD, PhD. It was created on their behalf by Glee Arvin. Manson Passey, OA an ophthalmic technician. The creation of this record is the provider's dictation and/or activities during the visit.    Electronically signed by: Glee Arvin. Manson Passey, OA 12/15/23 12:54 PM  Karie Chimera, M.D., Ph.D. Diseases & Surgery of the Retina and Vitreous [facility]  I have reviewed the above documentation for accuracy and completeness, and I agree with the above. Karie Chimera, M.D., Ph.D. 12/15/23 12:54 PM   Abbreviations: M myopia (nearsighted); A astigmatism; H hyperopia (farsighted); P presbyopia; Mrx spectacle prescription;  CTL contact lenses; OD right eye; OS left eye; OU both  eyes  XT exotropia; ET esotropia; PEK punctate epithelial keratitis; PEE punctate epithelial erosions; DES dry eye syndrome; MGD meibomian gland dysfunction; ATs artificial tears; PFAT's preservative free artificial tears; NSC nuclear sclerotic cataract; PSC posterior subcapsular cataract; ERM epi-retinal membrane; PVD posterior vitreous detachment; RD retinal detachment; DM diabetes mellitus; DR diabetic retinopathy; NPDR non-proliferative diabetic  retinopathy; PDR proliferative diabetic retinopathy; CSME clinically significant macular edema; DME diabetic macular edema; dbh dot blot hemorrhages; CWS cotton wool spot; POAG primary open angle glaucoma; C/D cup-to-disc ratio; HVF humphrey visual field; GVF goldmann visual field; OCT optical coherence tomography; IOP intraocular pressure; BRVO Branch retinal vein occlusion; CRVO central retinal vein occlusion; CRAO central retinal artery occlusion; BRAO branch retinal artery occlusion; RT retinal tear; SB scleral buckle; PPV pars plana vitrectomy; VH Vitreous hemorrhage; PRP panretinal laser photocoagulation; IVK intravitreal kenalog; VMT vitreomacular traction; MH Macular hole;  NVD neovascularization of the disc; NVE neovascularization elsewhere; AREDS age related eye disease study; ARMD age related macular degeneration; POAG primary open angle glaucoma; EBMD epithelial/anterior basement membrane dystrophy; ACIOL anterior chamber intraocular lens; IOL intraocular lens; PCIOL posterior chamber intraocular lens; Phaco/IOL phacoemulsification with intraocular lens placement; PRK photorefractive keratectomy; LASIK laser assisted in situ keratomileusis; HTN hypertension; DM diabetes mellitus; COPD chronic obstructive pulmonary disease

## 2023-12-15 ENCOUNTER — Encounter (INDEPENDENT_AMBULATORY_CARE_PROVIDER_SITE_OTHER): Payer: Self-pay | Admitting: Ophthalmology

## 2023-12-15 ENCOUNTER — Ambulatory Visit (INDEPENDENT_AMBULATORY_CARE_PROVIDER_SITE_OTHER): Payer: Medicare PPO | Admitting: Ophthalmology

## 2023-12-15 DIAGNOSIS — H40003 Preglaucoma, unspecified, bilateral: Secondary | ICD-10-CM

## 2023-12-15 DIAGNOSIS — H40051 Ocular hypertension, right eye: Secondary | ICD-10-CM

## 2023-12-15 DIAGNOSIS — H35373 Puckering of macula, bilateral: Secondary | ICD-10-CM | POA: Diagnosis not present

## 2023-12-15 DIAGNOSIS — Z961 Presence of intraocular lens: Secondary | ICD-10-CM | POA: Diagnosis not present

## 2023-12-15 DIAGNOSIS — H35033 Hypertensive retinopathy, bilateral: Secondary | ICD-10-CM

## 2023-12-15 DIAGNOSIS — H34831 Tributary (branch) retinal vein occlusion, right eye, with macular edema: Secondary | ICD-10-CM | POA: Diagnosis not present

## 2023-12-15 DIAGNOSIS — E119 Type 2 diabetes mellitus without complications: Secondary | ICD-10-CM

## 2023-12-15 DIAGNOSIS — I10 Essential (primary) hypertension: Secondary | ICD-10-CM | POA: Diagnosis not present

## 2023-12-15 MED ORDER — AFLIBERCEPT 2MG/0.05ML IZ SOLN FOR KALEIDOSCOPE
2.0000 mg | INTRAVITREAL | Status: AC | PRN
  Administered 2023-12-15: 2 mg via INTRAVITREAL

## 2024-01-28 DIAGNOSIS — E1122 Type 2 diabetes mellitus with diabetic chronic kidney disease: Secondary | ICD-10-CM | POA: Diagnosis not present

## 2024-01-28 DIAGNOSIS — R413 Other amnesia: Secondary | ICD-10-CM | POA: Diagnosis not present

## 2024-01-28 DIAGNOSIS — N1832 Chronic kidney disease, stage 3b: Secondary | ICD-10-CM | POA: Diagnosis not present

## 2024-01-28 DIAGNOSIS — N390 Urinary tract infection, site not specified: Secondary | ICD-10-CM | POA: Diagnosis not present

## 2024-01-28 DIAGNOSIS — I129 Hypertensive chronic kidney disease with stage 1 through stage 4 chronic kidney disease, or unspecified chronic kidney disease: Secondary | ICD-10-CM | POA: Diagnosis not present

## 2024-01-31 ENCOUNTER — Other Ambulatory Visit: Payer: Self-pay | Admitting: Nephrology

## 2024-01-31 DIAGNOSIS — N1832 Chronic kidney disease, stage 3b: Secondary | ICD-10-CM

## 2024-02-03 NOTE — Progress Notes (Signed)
[facility] - Clinic Note  02/09/2024    CHIEF COMPLAINT Patient presents for Retina Follow Up  HISTORY OF PRESENT ILLNESS: Alejandra Davis is a 88 y.o. female who presents to the clinic today for:   HPI     Retina Follow Up   Patient presents with  CRVO/BRVO.  In right eye.  This started 8 weeks ago.  Duration of 8 weeks.  Since onset it is stable.  I, the attending physician,  performed the HPI with the patient and updated documentation appropriately.        Comments   8 week retina follow up BRVO OD and I'VE OD pt is reporting no vision changes noticed she denies any flashes or floaters       Last edited by Rennis Chris, MD on 02/09/2024 11:26 AM.     Referring physician: No referring provider defined for this encounter.  HISTORICAL INFORMATION:   Selected notes from the MEDICAL RECORD NUMBER Referred by Dr. Joylene Igo for DM exam   CURRENT MEDICATIONS: Current Outpatient Medications (Ophthalmic Drugs)  Medication Sig   dorzolamide-timolol (COSOPT) 22.3-6.8 MG/ML ophthalmic solution Place 1 drop into both eyes 2 (two) times daily.   No current facility-administered medications for this visit. (Ophthalmic Drugs)   Current Outpatient Medications (Other)  Medication Sig   amLODipine (NORVASC) 10 MG tablet TAKE 1 TABLET(10 MG) BY MOUTH DAILY   ASPIRIN LOW DOSE 81 MG EC tablet Take 81 mg by mouth daily.   levothyroxine (SYNTHROID) 75 MCG tablet Take 1 tablet (75 mcg total) by mouth daily.   lisinopril (ZESTRIL) 2.5 MG tablet Take 1 tablet (2.5 mg total) by mouth daily.   loratadine (CLARITIN) 10 MG tablet TAKE 1 TABLET(10 MG) BY MOUTH DAILY   memantine (NAMENDA XR) 7 MG CP24 24 hr capsule Take 1 capsule (7 mg total) by mouth daily.   metoprolol tartrate (LOPRESSOR) 50 MG tablet TAKE 1 TABLET(50 MG) BY MOUTH DAILY   No current facility-administered medications for this visit. (Other)   REVIEW OF SYSTEMS: ROS   Positive for: Skin, Genitourinary,  Musculoskeletal, Endocrine, Cardiovascular, Eyes Negative for: Constitutional, Gastrointestinal, Neurological, HENT, Respiratory, Psychiatric, Allergic/Imm, Heme/Lymph Last edited by Etheleen Mayhew, COT on 02/09/2024  9:10 AM.     ALLERGIES Allergies  Allergen Reactions   Donepezil Hcl     Stomach cramps    PAST MEDICAL HISTORY Past Medical History:  Diagnosis Date   Chicken pox    Cystocele    Diabetes mellitus without complication (HCC)    Glaucoma    Hypertension    Hypertensive retinopathy    OU   Neuromuscular disorder (HCC)    Dementia    Thyroid disease    hypothyroidism   Past Surgical History:  Procedure Laterality Date   ABDOMINAL HYSTERECTOMY  1990   TAH BSO   CATARACT EXTRACTION Bilateral    Dr. Alben Spittle   COLONOSCOPY WITH PROPOFOL N/A 04/08/2021   Procedure: COLONOSCOPY WITH PROPOFOL;  Surgeon: Charlott Rakes, MD;  Location: [facility];  Service: Endoscopy;  Laterality: N/A;   EYE SURGERY Bilateral    Cat Sx OU   OOPHORECTOMY     BSO   Vaginal Bx     Papilloma   FAMILY HISTORY Family History  Problem Relation Age of Onset   Sickle cell anemia Other    Hypertension Mother    Cancer Father        LIVER   Heart disease Brother    Cancer Brother  STOMACH   SOCIAL HISTORY Social History   Tobacco Use   Smoking status: Never   Smokeless tobacco: Never  Vaping Use   Vaping status: Never Used  Substance Use Topics   Alcohol use: No   Drug use: No       OPHTHALMIC EXAM: Base Eye Exam     Visual Acuity (Snellen - Linear)       Right Left   Dist [place] 20/200 20/50 -3   Dist ph [place] NI 20/30 -2         Tonometry (Tonopen, 9:16 AM)       Right Left   Pressure 16 18         Pupils       Pupils Dark Light Shape React APD   Right PERRL 3 2 Round Brisk None   Left PERRL 3 2 Round Brisk None         Visual Fields       Left Right    Full Full         Extraocular Movement       Right Left    Full, Ortho  Full, Ortho         Neuro/Psych     Oriented x3: Yes   Mood/Affect: Normal         Dilation     Both eyes: 2.5% Phenylephrine @ 9:16 AM           Slit Lamp and Fundus Exam     Slit Lamp Exam       Right Left   Lids/Lashes Dermatochalasis - upper lid, Meibomian gland dysfunction Dermatochalasis - upper lid, Meibomian gland dysfunction   Conjunctiva/Sclera Melanosis Melanosis   Cornea Arcus, trace Punctate epithelial erosions, tear film debris, 1+ [place], Well healed temporal cataract wound Arcus, Inferior 1+ Punctate epithelial erosions, well healed cataract wound   Anterior Chamber Deep and quiet Deep;  no cell or flare   Iris Round and dilated, No NVI Round and dilated, No NVI   Lens PC IOL in good position, 2+ Posterior capsular opacification Posterior chamber intraocular lens in good position, 1-2+ PCO.   Anterior Vitreous Vitreous syneresis, vitreous condensations, silicone oil bubbles Vitreous syneresis, Vitreous condensations         Fundus Exam       Right Left   Disc Sharp rim, inferior notch, 3-4+ pallor, inf. Rim thinning, attenuated vessels superiorly, fine vascular loops superiorly 2+ pallor, Sharp rim, thin inferior rim   C/D Ratio 0.85 0.75   Macula Blunted foveal reflex, persistent cystic changes superior mac; stable improvement in DBH superior mac, central exudates, fibrosis and atrophy -- stable, no frank heme Flat, good foveal reflex, mild RPE mottling and clumping, Epiretinal membrane, No heme or edema   Vessels superior BRVO with severe attenuation of ST arterioles, Tortuous attenuated, Tortuous   Periphery Attached, DBH superior hemisphere extended from BRVO -- stably resolved, No heme Attached, mild scattered Reticular degeneration, No heme           Refraction     Wearing Rx       Sphere Cylinder Axis Add   Right -0.75 +1.25 170 +2.50   Left -0.75 +1.00 003 +2.50    Type: PAL         Wearing Rx #2       Sphere Cylinder Axis  Add   Right -0.75 +1.25 170 +2.50   Left -0.75 +1.00 003 +2.50    Type: PAL  IMAGING AND PROCEDURES  Imaging and Procedures for 03/07/18  OCT, Retina - OU - Both Eyes       Right Eye Quality was good. Central Foveal Thickness: 274. Progression has improved. Findings include no SRF, abnormal foveal contour, subretinal hyper-reflective material, intraretinal hyper-reflective material, epiretinal membrane, intraretinal fluid, outer retinal atrophy, vitreomacular adhesion (Central cystic changes -- slightly improved, central SRHM and patchy ORA; diffuse retinal thinning -- stable, persistent VMT -- slightly improved).   Left Eye Quality was good. Central Foveal Thickness: 246. Progression has been stable. Findings include no IRF, no SRF, abnormal foveal contour, epiretinal membrane, vitreomacular adhesion (Blunted foveal depression -- stable).   Notes *Images captured and stored on drive  Diagnosis / Impression:  ERM OU OD: BRVO w/ CME -- Central cystic changes -- slightly improved, central SRHM and patchy ORA; diffuse retinal thinning -- stable, persistent VMT -- slightly improved OS: very mild ERM with blunted foveal depression -- stable; mild VMA  Clinical management:  See below  Abbreviations: NFP - Normal foveal profile. CME - cystoid macular edema. PED - pigment epithelial detachment. IRF - intraretinal fluid. SRF - subretinal fluid. EZ - ellipsoid zone. ERM - epiretinal membrane. ORA - outer retinal atrophy. ORT - outer retinal tubulation. SRHM - subretinal hyper-reflective material       Intravitreal Injection, Pharmacologic Agent - OD - Right Eye       Time Out 02/09/2024. 9:39 AM. Confirmed correct patient, procedure, site, and patient consented.   Anesthesia Topical anesthesia was used. Anesthetic medications included Lidocaine 2%, Proparacaine 0.5%.   Procedure Preparation included 5% betadine to ocular surface, eyelid speculum. A supplied (32g)  needle was used.   Injection: 1.25 mg Bevacizumab 1.25mg /0.74ml   Route: Intravitreal, Site: Right Eye   NDC: P3213405, Lot: [id number], Expiration date: 03/14/2024   Post-op Post injection exam found visual acuity of at least counting fingers. The patient tolerated the procedure well. There were no complications. The patient received written and verbal post procedure care education. Post injection medications were not given.            ASSESSMENT/PLAN:   ICD-10-CM   1. Branch retinal vein occlusion of right eye with macular edema  H34.8310 OCT, Retina - OU - Both Eyes    Intravitreal Injection, Pharmacologic Agent - OD - Right Eye    Bevacizumab (AVASTIN) SOLN 1.25 mg    2. Diabetes mellitus type 2 without retinopathy (HCC)  E11.9     3. Epiretinal membrane (ERM) of both eyes  H35.373     4. Pseudophakia of both eyes  Z96.1     5. Ocular hypertension of right eye  H40.051     6. Glaucoma suspect of both eyes  H40.003     7. Essential hypertension  I10     8. Hypertensive retinopathy of both eyes  H35.033      1. BRVO with CME OD - lost to follow up from 02.22.22 to 11.1.22 -- re-presented with massive CME and BCVA 20/400 from 20/30  - initial OCT w/ CME superior macula - s/p IVA OD #1 (10.18.19), #2 (11.19.19), #3 (12.17.19), #4 (02.19.20), #5 (03.23.20), #6 (04.21.20), #7 (05.27.20), #8 (07.07.20), #9 (08.25.20), #10 (9.29.20), #11 (11.17.20), #12 (12.22.20), #13 (01.27.21), #14 (03.10.21), #15 (04.21.21), #16 (05.26.21), #17 (06.30.21), #18 (08.04.21), #19 (09.01.21), #20 (10.06.21), #21 (11.17.21), #22 (12.30.21), #23 (2.22.22), #24 (11.1.22), #25 (11.29.22), #26 (01.03.23), #27 (02.14.23), #28 (04.07.23), #29 (05.19.23), #30 (06.20.23) **history of worsening IRF/cystic changes at 6+ weeks interval, IVA** -  s/p IVE OD #1 (08.11.23), #2 (09.08.23), #3 (10.06.23), #4 (11.06.23), #5 (12.13.23), #6 (01.24.24), #7 (03.27.24), #8 (05.08.24), #9 (06.19.24), #10 (07.31.24), #11  (09.11.24), #12 (10.30.24), #13 (12.18.24)  - BCVA OD 20/200 from 20/150  - OCT shows BRVO w/ CME -- Central cystic changes -- slightly improved, central SRHM and patchy ORA; diffuse retinal thinning -- stable, persistent VMT -- slightly improved at 8 weeks  - recommend switching back to IVA OD #31 today, 02.12.25 w/ f/u decreased back to 6 wks   - [employer] funding unavailable  - RBA of procedure discussed, questions answered - informed consent obtained and signed - see procedure note   - Avastin informed consent form signed and scanned on 02.12.25  - f/u in 6 weeks -- DFE/OCT, possible injection   2. Diabetes mellitus, type 2 without retinopathy - The incidence, risk factors for progression, natural history and treatment options for diabetic retinopathy were discussed with patient.   - The need for close monitoring of blood glucose, blood pressure, and serum lipids, avoiding cigarette or any type of tobacco, and the need for long term follow up was also discussed with patient.    3. Epiretinal membrane, OU  - relatively mild ERM OU -- blunted central foveal depression - OD with mild cystic changes 2/2 BRVO as above; OS without cystic changes or edema  - discussed findings and prognosis   - continue to monitor  4. Pseudophakia OU  - s/p CE/IOL OS 11.13.19 w/ Dr. Alben Spittle  - s/p CE/IOL OD 01.16.20 w/ Dr. Alben Spittle  - beautiful surgeries -- IOLs in perfect position  - healing well post-operatively - IRF/CME OD may have been partly due to post op CME ([place]) in addition to BRVO  5,6. Ocular hypertension / Glaucoma suspect OD>OS  - IOP 16,18  - denies any family hx of Glaucoma   - under the expert management of Dr. Zenaida Niece  - most recent notes reviewed (04.30.24 visit w/ Dr. Zenaida Niece)  - on Cosopt BID OU and Brimonidine TID OU per Dr. Zenaida Niece  7,8. Hypertensive retinopathy OU - pt presented to 6.24.2020 visit urgently for right sided headache above right eye with extension to occiput - BP at  that time was 201/100 -- pt was sent to primary care clinic for urgent evaluation and meds were adjusted  - BP now under better control and headaches improved  - BP reading 11.1.22 -- 180/84 -- advised f/u with PCP  - discussed importance of tight BP control  Ophthalmic Meds Ordered this visit:  Meds ordered this encounter  Medications   Bevacizumab (AVASTIN) SOLN 1.25 mg     Return in about 6 weeks (around 03/22/2024) for f/u BRVO OD, DFE, OCT, Possible Injxn.  There are no Patient Instructions on file for this visit.  This document serves as a record of services personally performed by Karie Chimera, MD, PhD. It was created on their behalf by Glee Arvin. Manson Passey, OA an ophthalmic technician. The creation of this record is the provider's dictation and/or activities during the visit.    Electronically signed by: Glee Arvin. Manson Passey, OA 02/09/24 11:28 AM  Karie Chimera, M.D., Ph.D. Diseases & Surgery of the Retina and Vitreous [facility]  I have reviewed the above documentation for accuracy and completeness, and I agree with the above. Karie Chimera, M.D., Ph.D. 02/09/24 11:30 AM   Abbreviations: M myopia (nearsighted); A astigmatism; H hyperopia (farsighted); P presbyopia; Mrx spectacle prescription;  CTL contact lenses;  OD right eye; OS left eye; OU both eyes  XT exotropia; ET esotropia; PEK punctate epithelial keratitis; PEE punctate epithelial erosions; DES dry eye syndrome; MGD meibomian gland dysfunction; ATs artificial tears; PFAT's preservative free artificial tears; NSC nuclear sclerotic cataract; PSC posterior subcapsular cataract; ERM epi-retinal membrane; PVD posterior vitreous detachment; RD retinal detachment; DM diabetes mellitus; DR diabetic retinopathy; NPDR non-proliferative diabetic retinopathy; PDR proliferative diabetic retinopathy; CSME clinically significant macular edema; DME diabetic macular edema; dbh dot blot hemorrhages; CWS cotton wool spot;  POAG primary open angle glaucoma; C/D cup-to-disc ratio; HVF humphrey visual field; GVF goldmann visual field; OCT optical coherence tomography; IOP intraocular pressure; BRVO Branch retinal vein occlusion; CRVO central retinal vein occlusion; CRAO central retinal artery occlusion; BRAO branch retinal artery occlusion; RT retinal tear; SB scleral buckle; PPV pars plana vitrectomy; VH Vitreous hemorrhage; PRP panretinal laser photocoagulation; IVK intravitreal kenalog; VMT vitreomacular traction; MH Macular hole;  NVD neovascularization of the disc; NVE neovascularization elsewhere; AREDS age related eye disease study; ARMD age related macular degeneration; POAG primary open angle glaucoma; EBMD epithelial/anterior basement membrane dystrophy; ACIOL anterior chamber intraocular lens; IOL intraocular lens; PCIOL posterior chamber intraocular lens; Phaco/IOL phacoemulsification with intraocular lens placement; PRK photorefractive keratectomy; LASIK laser assisted in situ keratomileusis; HTN hypertension; DM diabetes mellitus; COPD chronic obstructive pulmonary disease

## 2024-02-07 ENCOUNTER — Other Ambulatory Visit: Payer: Medicare PPO

## 2024-02-09 ENCOUNTER — Encounter (INDEPENDENT_AMBULATORY_CARE_PROVIDER_SITE_OTHER): Payer: Self-pay | Admitting: Ophthalmology

## 2024-02-09 ENCOUNTER — Ambulatory Visit (INDEPENDENT_AMBULATORY_CARE_PROVIDER_SITE_OTHER): Payer: Medicare PPO | Admitting: Ophthalmology

## 2024-02-09 DIAGNOSIS — H40003 Preglaucoma, unspecified, bilateral: Secondary | ICD-10-CM | POA: Diagnosis not present

## 2024-02-09 DIAGNOSIS — H35033 Hypertensive retinopathy, bilateral: Secondary | ICD-10-CM

## 2024-02-09 DIAGNOSIS — H35373 Puckering of macula, bilateral: Secondary | ICD-10-CM | POA: Diagnosis not present

## 2024-02-09 DIAGNOSIS — E119 Type 2 diabetes mellitus without complications: Secondary | ICD-10-CM

## 2024-02-09 DIAGNOSIS — H40051 Ocular hypertension, right eye: Secondary | ICD-10-CM

## 2024-02-09 DIAGNOSIS — H34831 Tributary (branch) retinal vein occlusion, right eye, with macular edema: Secondary | ICD-10-CM

## 2024-02-09 DIAGNOSIS — Z961 Presence of intraocular lens: Secondary | ICD-10-CM

## 2024-02-09 DIAGNOSIS — I10 Essential (primary) hypertension: Secondary | ICD-10-CM | POA: Diagnosis not present

## 2024-02-09 MED ORDER — BEVACIZUMAB CHEMO INJECTION 1.25MG/0.05ML SYRINGE FOR KALEIDOSCOPE
1.2500 mg | INTRAVITREAL | Status: AC | PRN
  Administered 2024-02-09: 1.25 mg via INTRAVITREAL

## 2024-02-10 ENCOUNTER — Ambulatory Visit
Admission: RE | Admit: 2024-02-10 | Discharge: 2024-02-10 | Disposition: A | Discharge status: Home / Self Care | Payer: Medicare PPO | Source: Ambulatory Visit | Attending: Nephrology | Admitting: Nephrology

## 2024-02-10 DIAGNOSIS — N323 Diverticulum of bladder: Secondary | ICD-10-CM | POA: Diagnosis not present

## 2024-02-10 DIAGNOSIS — N261 Atrophy of kidney (terminal): Secondary | ICD-10-CM | POA: Diagnosis not present

## 2024-02-10 DIAGNOSIS — N281 Cyst of kidney, acquired: Secondary | ICD-10-CM | POA: Diagnosis not present

## 2024-02-10 DIAGNOSIS — N1832 Chronic kidney disease, stage 3b: Secondary | ICD-10-CM

## 2024-03-06 DIAGNOSIS — R42 Dizziness and giddiness: Secondary | ICD-10-CM | POA: Diagnosis not present

## 2024-03-06 DIAGNOSIS — I10 Essential (primary) hypertension: Secondary | ICD-10-CM | POA: Diagnosis not present

## 2024-03-09 NOTE — Progress Notes (Addendum)
 [facility] - Clinic Note  03/21/2024    CHIEF COMPLAINT Patient presents for Retina Follow Up  HISTORY OF PRESENT ILLNESS: Alejandra Davis is a 88 y.o. female who presents to the clinic today for:   HPI     Retina Follow Up   Patient presents with  CRVO/BRVO.  In right eye.  This started 6 weeks ago.  Duration of 6 weeks.  Since onset it is stable.  I, the attending physician,  performed the HPI with the patient and updated documentation appropriately.        Comments   Patient feels the vision is the same. She is not using eye drops at this time. She does not check her blood sugar daily.       Last edited by Rennis Chris, MD on 03/21/2024  5:01 PM.    Pt states vision is the same  Referring physician: No referring provider defined for this encounter.  HISTORICAL INFORMATION:   Selected notes from the MEDICAL RECORD NUMBER Referred by Dr. Joylene Igo for DM exam   CURRENT MEDICATIONS: Current Outpatient Medications (Ophthalmic Drugs)  Medication Sig   dorzolamide-timolol (COSOPT) 22.3-6.8 MG/ML ophthalmic solution Place 1 drop into both eyes 2 (two) times daily.   No current facility-administered medications for this visit. (Ophthalmic Drugs)   Current Outpatient Medications (Other)  Medication Sig   amLODipine (NORVASC) 10 MG tablet TAKE 1 TABLET(10 MG) BY MOUTH DAILY   ASPIRIN LOW DOSE 81 MG EC tablet Take 81 mg by mouth daily.   levothyroxine (SYNTHROID) 75 MCG tablet Take 1 tablet (75 mcg total) by mouth daily.   lisinopril (ZESTRIL) 2.5 MG tablet Take 1 tablet (2.5 mg total) by mouth daily.   loratadine (CLARITIN) 10 MG tablet TAKE 1 TABLET(10 MG) BY MOUTH DAILY   memantine (NAMENDA XR) 7 MG CP24 24 hr capsule Take 1 capsule (7 mg total) by mouth daily.   metoprolol tartrate (LOPRESSOR) 50 MG tablet TAKE 1 TABLET(50 MG) BY MOUTH DAILY   No current facility-administered medications for this visit. (Other)   REVIEW OF SYSTEMS: ROS   Positive  for: Skin, Genitourinary, Musculoskeletal, Endocrine, Cardiovascular, Eyes Negative for: Constitutional, Gastrointestinal, Neurological, HENT, Respiratory, Psychiatric, Allergic/Imm, Heme/Lymph Last edited by Charlette Caffey, COT on 03/21/2024  9:26 AM.      ALLERGIES Allergies  Allergen Reactions   Donepezil Hcl     Stomach cramps    PAST MEDICAL HISTORY Past Medical History:  Diagnosis Date   Chicken pox    Cystocele    Diabetes mellitus without complication (HCC)    Glaucoma    Hypertension    Hypertensive retinopathy    OU   Neuromuscular disorder (HCC)    Dementia    Thyroid disease    hypothyroidism   Past Surgical History:  Procedure Laterality Date   ABDOMINAL HYSTERECTOMY  1990   TAH BSO   CATARACT EXTRACTION Bilateral    Dr. Alben Spittle   COLONOSCOPY WITH PROPOFOL N/A 04/08/2021   Procedure: COLONOSCOPY WITH PROPOFOL;  Surgeon: Charlott Rakes, MD;  Location: [facility];  Service: Endoscopy;  Laterality: N/A;   EYE SURGERY Bilateral    Cat Sx OU   OOPHORECTOMY     BSO   Vaginal Bx     Papilloma   FAMILY HISTORY Family History  Problem Relation Age of Onset   Sickle cell anemia Other    Hypertension Mother    Cancer Father        LIVER  Heart disease Brother    Cancer Brother        STOMACH   SOCIAL HISTORY Social History   Tobacco Use   Smoking status: Never   Smokeless tobacco: Never  Vaping Use   Vaping status: Never Used  Substance Use Topics   Alcohol use: No   Drug use: No       OPHTHALMIC EXAM: Base Eye Exam     Visual Acuity (Snellen - Linear)       Right Left   Dist cc 20/200 20/50   Dist ph cc NI NI    Correction: Glasses         Tonometry (Tonopen, 9:32 AM)       Right Left   Pressure 17 19         Pupils       Dark Light Shape React APD   Right 3 2 Round Brisk None   Left 3 2 Round Brisk None         Visual Fields       Left Right    Full Full         Extraocular Movement       Right  Left    Full, Ortho Full, Ortho         Neuro/Psych     Oriented x3: Yes   Mood/Affect: Normal         Dilation     Both eyes: 1.0% Mydriacyl, 2.5% Phenylephrine @ 9:27 AM           Slit Lamp and Fundus Exam     Slit Lamp Exam       Right Left   Lids/Lashes Dermatochalasis - upper lid, Meibomian gland dysfunction Dermatochalasis - upper lid, Meibomian gland dysfunction   Conjunctiva/Sclera Melanosis Melanosis   Cornea Arcus, trace Punctate epithelial erosions, tear film debris, 1+ [place]utt[place], Well healed temporal cataract wound Arcus, Inferior 1+ Punctate epithelial erosions, well healed cataract wound   Anterior Chamber Deep and quiet Deep;  no cell or flare   Iris Round and dilated, No NVI Round and dilated, No NVI   Lens PC IOL in good position, 2+ Posterior capsular opacification Posterior chamber intraocular lens in good position, 1-2+ PCO.   Anterior Vitreous Vitreous syneresis, vitreous condensations, silicone oil bubbles Vitreous syneresis, Vitreous condensations         Fundus Exam       Right Left   Disc Sharp rim, inferior notch, 3-4+ pallor, inf. Rim thinning, attenuated vessels superiorly, fine vascular loops superiorly 2+ pallor, Sharp rim, thin inferior rim   C/D Ratio 0.85 0.75   Macula Blunted foveal reflex, persistent cystic changes superior mac; stable improvement in DBH superior mac, central exudates, fibrosis and atrophy -- stable, no frank heme Flat, good foveal reflex, mild RPE mottling and clumping, Epiretinal membrane, No heme or edema   Vessels superior BRVO with severe attenuation of ST arterioles, Tortuous attenuated, mild tortuosity   Periphery Attached, DBH superior hemisphere extended from BRVO -- stably resolved, No heme Attached, mild scattered Reticular degeneration, No heme           IMAGING AND PROCEDURES  Imaging and Procedures for 03/07/18  OCT, Retina - OU - Both Eyes       Right Eye Quality was good. Central Foveal  Thickness: 255. Progression has improved. Findings include no SRF, abnormal foveal contour, subretinal hyper-reflective material, intraretinal hyper-reflective material, epiretinal membrane, intraretinal fluid, outer retinal atrophy, vitreomacular adhesion (Central cystic changes --  slightly improved, central SRHM and patchy ORA; diffuse retinal thinning -- stable, persistent VMT -- slightly improved centrally).   Left Eye Quality was good. Central Foveal Thickness: 246. Progression has been stable. Findings include no IRF, no SRF, abnormal foveal contour, epiretinal membrane, vitreomacular adhesion (Blunted foveal depression -- stable).   Notes *Images captured and stored on drive  Diagnosis / Impression:  ERM OU OD: BRVO w/ CME -- Central cystic changes -- slightly improved, central SRHM and patchy ORA; diffuse retinal thinning -- stable, persistent VMT -- slightly improved centrally OS: very mild ERM with blunted foveal depression -- stable; mild VMA  Clinical management:  See below  Abbreviations: NFP - Normal foveal profile. CME - cystoid macular edema. PED - pigment epithelial detachment. IRF - intraretinal fluid. SRF - subretinal fluid. EZ - ellipsoid zone. ERM - epiretinal membrane. ORA - outer retinal atrophy. ORT - outer retinal tubulation. SRHM - subretinal hyper-reflective material       Intravitreal Injection, Pharmacologic Agent - OD - Right Eye       Time Out 03/21/2024. 10:04 AM. Confirmed correct patient, procedure, site, and patient consented.   Anesthesia Topical anesthesia was used. Anesthetic medications included Lidocaine 2%, Proparacaine 0.5%.   Procedure Preparation included 5% betadine to ocular surface, eyelid speculum. A supplied (32g) needle was used.   Injection: 1.25 mg Bevacizumab 1.25mg /0.16ml   Route: Intravitreal, Site: Right Eye   NDC: [id number], Lot: [id number], Expiration date: 04/30/2024   Post-op Post injection exam found visual acuity of at  least counting fingers. The patient tolerated the procedure well. There were no complications. The patient received written and verbal post procedure care education. Post injection medications were not given.            ASSESSMENT/PLAN:   ICD-10-CM   1. Branch retinal vein occlusion of right eye with macular edema  H34.8310 OCT, Retina - OU - Both Eyes    Intravitreal Injection, Pharmacologic Agent - OD - Right Eye    Bevacizumab (AVASTIN) SOLN 1.25 mg    2. Diabetes mellitus type 2 without retinopathy (HCC)  E11.9     3. Epiretinal membrane (ERM) of both eyes  H35.373     4. Pseudophakia of both eyes  Z96.1     5. Ocular hypertension of right eye  H40.051     6. Glaucoma suspect of both eyes  H40.003     7. Essential hypertension  I10     8. Hypertensive retinopathy of both eyes  H35.033       1. BRVO with CME OD - lost to follow up from 02.22.22 to 11.1.22 -- re-presented with massive CME and BCVA 20/400 from 20/30  - initial OCT w/ CME superior macula - s/p IVA OD #1 (10.18.19), #2 (11.19.19), #3 (12.17.19), #4 (02.19.20), #5 (03.23.20), #6 (04.21.20), #7 (05.27.20), #8 (07.07.20), #9 (08.25.20), #10 (9.29.20), #11 (11.17.20), #12 (12.22.20), #13 (01.27.21), #14 (03.10.21), #15 (04.21.21), #16 (05.26.21), #17 (06.30.21), #18 (08.04.21), #19 (09.01.21), #20 (10.06.21), #21 (11.17.21), #22 (12.30.21), #23 (2.22.22), #24 (11.1.22), #25 (11.29.22), #26 (01.03.23), #27 (02.14.23), #28 (04.07.23), #29 (05.19.23), #30 (06.20.23), #31 (02.12.25) **history of worsening IRF/cystic changes at 6+ weeks interval, IVA** - s/p IVE OD #1 (08.11.23), #2 (09.08.23), #3 (10.06.23), #4 (11.06.23), #5 (12.13.23), #6 (01.24.24), #7 (03.27.24), #8 (05.08.24), #9 (06.19.24), #10 (07.31.24), #11 (09.11.24), #12 (10.30.24), #13 (12.18.24)  - BCVA OD 20/200 -- stable - OCT shows central cystic changes -- slightly improved, central SRHM and patchy ORA; diffuse retinal thinning -- stable, persistent  VMT --  slightly improved centrally at 8 weeks  - recommend IVA OD #32 today, 03.25.25 w/ f/u extended to 6-8 wks   - [employer] funding unavailable  - RBA of procedure discussed, questions answered - informed consent obtained and signed - see procedure note   - Avastin informed consent form signed and scanned on 02.12.25  - f/u in 6 weeks -- DFE/OCT, possible injection   2. Diabetes mellitus, type 2 without retinopathy - The incidence, risk factors for progression, natural history and treatment options for diabetic retinopathy were discussed with patient.   - The need for close monitoring of blood glucose, blood pressure, and serum lipids, avoiding cigarette or any type of tobacco, and the need for long term follow up was also discussed with patient.    3. Epiretinal membrane, OU  - relatively mild ERM OU -- blunted central foveal depression - OD with mild cystic changes 2/2 BRVO as above; OS without cystic changes or edema  - discussed findings and prognosis   - continue to monitor  4. Pseudophakia OU  - s/p CE/IOL OS 11.13.19 w/ Dr. Alben Spittle  - s/p CE/IOL OD 01.16.20 w/ Dr. Alben Spittle  - beautiful surgeries -- IOLs in perfect position  - healing well post-operatively - IRF/CME OD may have been partly due to post op CME ([place]) in addition to BRVO  5,6. Ocular hypertension / Glaucoma suspect OD>OS  - IOP 17,19  - denies any family hx of Glaucoma   - under the expert management of Dr. Zenaida Niece  - most recent notes reviewed (04.30.24 visit w/ Dr. Zenaida Niece)  - on Cosopt BID OU and Brimonidine TID OU per Dr. Zenaida Niece  7,8. Hypertensive retinopathy OU - pt presented to 6.24.2020 visit urgently for right sided headache above right eye with extension to occiput - BP at that time was 201/100 -- pt was sent to primary care clinic for urgent evaluation and meds were adjusted  - BP now under better control and headaches improved  - BP reading 11.1.22 -- 180/84 -- advised f/u with PCP  - discussed importance of  tight BP control  Ophthalmic Meds Ordered this visit:  Meds ordered this encounter  Medications   Bevacizumab (AVASTIN) SOLN 1.25 mg     Return for f/u 6-8 weeks, BRVO OD, DFE, OCT.  There are no Patient Instructions on file for this visit.  This document serves as a record of services personally performed by Karie Chimera, MD, PhD. It was created on their behalf by Charlette Caffey, COT an ophthalmic technician. The creation of this record is the provider's dictation and/or activities during the visit.    Electronically signed by:  Charlette Caffey, COT  03/21/24 5:09 PM  This document serves as a record of services personally performed by Karie Chimera, MD, PhD. It was created on their behalf by Glee Arvin. Manson Passey, OA an ophthalmic technician. The creation of this record is the provider's dictation and/or activities during the visit.    Electronically signed by: Glee Arvin. Manson Passey, OA 03/21/24 5:09 PM  Karie Chimera, M.D., Ph.D. Diseases & Surgery of the Retina and Vitreous [facility]  I have reviewed the above documentation for accuracy and completeness, and I agree with the above. Karie Chimera, M.D., Ph.D. 03/21/24 5:09 PM   Abbreviations: M myopia (nearsighted); A astigmatism; H hyperopia (farsighted); P presbyopia; Mrx spectacle prescription;  CTL contact lenses; OD right eye; OS left eye; OU both eyes  XT exotropia; ET esotropia; PEK punctate epithelial keratitis; PEE punctate epithelial erosions; DES dry eye syndrome; MGD meibomian gland dysfunction; ATs artificial tears; PFAT's preservative free artificial tears; NSC nuclear sclerotic cataract; PSC posterior subcapsular cataract; ERM epi-retinal membrane; PVD posterior vitreous detachment; RD retinal detachment; DM diabetes mellitus; DR diabetic retinopathy; NPDR non-proliferative diabetic retinopathy; PDR proliferative diabetic retinopathy; CSME clinically significant macular edema; DME diabetic  macular edema; dbh dot blot hemorrhages; CWS cotton wool spot; POAG primary open angle glaucoma; C/D cup-to-disc ratio; HVF humphrey visual field; GVF goldmann visual field; OCT optical coherence tomography; IOP intraocular pressure; BRVO Branch retinal vein occlusion; CRVO central retinal vein occlusion; CRAO central retinal artery occlusion; BRAO branch retinal artery occlusion; RT retinal tear; SB scleral buckle; PPV pars plana vitrectomy; VH Vitreous hemorrhage; PRP panretinal laser photocoagulation; IVK intravitreal kenalog; VMT vitreomacular traction; MH Macular hole;  NVD neovascularization of the disc; NVE neovascularization elsewhere; AREDS age related eye disease study; ARMD age related macular degeneration; POAG primary open angle glaucoma; EBMD epithelial/anterior basement membrane dystrophy; ACIOL anterior chamber intraocular lens; IOL intraocular lens; PCIOL posterior chamber intraocular lens; Phaco/IOL phacoemulsification with intraocular lens placement; PRK photorefractive keratectomy; LASIK laser assisted in situ keratomileusis; HTN hypertension; DM diabetes mellitus; COPD chronic obstructive pulmonary disease

## 2024-03-20 ENCOUNTER — Other Ambulatory Visit: Payer: Self-pay | Admitting: *Deleted

## 2024-03-20 MED ORDER — MEMANTINE HCL ER 7 MG PO CP24: 7.0000 mg | ORAL_CAPSULE | Freq: Every day | ORAL | 0 refills | Status: DC

## 2024-03-20 NOTE — Telephone Encounter (Signed)
 Last seen on 11/03/23 Follow up scheduled on 05/23/24

## 2024-03-21 ENCOUNTER — Encounter (INDEPENDENT_AMBULATORY_CARE_PROVIDER_SITE_OTHER): Payer: Self-pay | Admitting: Ophthalmology

## 2024-03-21 ENCOUNTER — Ambulatory Visit (INDEPENDENT_AMBULATORY_CARE_PROVIDER_SITE_OTHER): Payer: Medicare PPO | Admitting: Ophthalmology

## 2024-03-21 DIAGNOSIS — H40051 Ocular hypertension, right eye: Secondary | ICD-10-CM

## 2024-03-21 DIAGNOSIS — H34831 Tributary (branch) retinal vein occlusion, right eye, with macular edema: Secondary | ICD-10-CM

## 2024-03-21 DIAGNOSIS — H40003 Preglaucoma, unspecified, bilateral: Secondary | ICD-10-CM | POA: Diagnosis not present

## 2024-03-21 DIAGNOSIS — Z961 Presence of intraocular lens: Secondary | ICD-10-CM

## 2024-03-21 DIAGNOSIS — E119 Type 2 diabetes mellitus without complications: Secondary | ICD-10-CM | POA: Diagnosis not present

## 2024-03-21 DIAGNOSIS — I10 Essential (primary) hypertension: Secondary | ICD-10-CM

## 2024-03-21 DIAGNOSIS — H35033 Hypertensive retinopathy, bilateral: Secondary | ICD-10-CM

## 2024-03-21 DIAGNOSIS — H35373 Puckering of macula, bilateral: Secondary | ICD-10-CM

## 2024-03-21 MED ORDER — BEVACIZUMAB CHEMO INJECTION 1.25MG/0.05ML SYRINGE FOR KALEIDOSCOPE
1.2500 mg | INTRAVITREAL | Status: AC | PRN
  Administered 2024-03-21: 1.25 mg via INTRAVITREAL

## 2024-04-07 DIAGNOSIS — R011 Cardiac murmur, unspecified: Secondary | ICD-10-CM | POA: Diagnosis not present

## 2024-04-07 DIAGNOSIS — N1832 Chronic kidney disease, stage 3b: Secondary | ICD-10-CM | POA: Diagnosis not present

## 2024-04-07 DIAGNOSIS — I10 Essential (primary) hypertension: Secondary | ICD-10-CM | POA: Diagnosis not present

## 2024-04-18 NOTE — Progress Notes (Signed)
 [facility] - Clinic Note  05/02/2024    CHIEF COMPLAINT Patient presents for Retina Follow Up  HISTORY OF PRESENT ILLNESS: Alejandra Davis is a 88 y.o. female who presents to the clinic today for:   HPI     Retina Follow Up   Patient presents with  CRVO/BRVO.  In right eye.  This started 6 weeks ago.  Duration of 6 weeks.  Since onset it is stable.  I, the attending physician,  performed the HPI with the patient and updated documentation appropriately.        Comments   6 week BRVO OD and IVA OD pt is reporting vision seems about the same she denies any flashes or floaters       Last edited by Ronelle Coffee, MD on 05/02/2024 12:10 PM.     Pt states vision is the same  Referring physician: No referring provider defined for this encounter.  HISTORICAL INFORMATION:   Selected notes from the MEDICAL RECORD NUMBER Referred by Dr. Burrell Casa for DM exam   CURRENT MEDICATIONS: Current Outpatient Medications (Ophthalmic Drugs)  Medication Sig   dorzolamide -timolol  (COSOPT ) 22.3-6.8 MG/ML ophthalmic solution Place 1 drop into both eyes 2 (two) times daily.   No current facility-administered medications for this visit. (Ophthalmic Drugs)   Current Outpatient Medications (Other)  Medication Sig   amLODipine  (NORVASC ) 10 MG tablet TAKE 1 TABLET(10 MG) BY MOUTH DAILY   ASPIRIN  LOW DOSE 81 MG EC tablet Take 81 mg by mouth daily.   levothyroxine  (SYNTHROID ) 75 MCG tablet Take 1 tablet (75 mcg total) by mouth daily.   lisinopril  (ZESTRIL ) 2.5 MG tablet Take 1 tablet (2.5 mg total) by mouth daily.   loratadine  (CLARITIN ) 10 MG tablet TAKE 1 TABLET(10 MG) BY MOUTH DAILY   memantine  (NAMENDA  XR) 7 MG CP24 24 hr capsule Take 1 capsule (7 mg total) by mouth daily.   metoprolol  tartrate (LOPRESSOR ) 50 MG tablet TAKE 1 TABLET(50 MG) BY MOUTH DAILY   No current facility-administered medications for this visit. (Other)   REVIEW OF SYSTEMS: ROS   Positive for: Skin,  Genitourinary, Musculoskeletal, Endocrine, Cardiovascular, Eyes Negative for: Constitutional, Gastrointestinal, Neurological, HENT, Respiratory, Psychiatric, Allergic/Imm, Heme/Lymph Last edited by Alise Appl, COT on 05/02/2024  8:52 AM.     ALLERGIES Allergies  Allergen Reactions   Donepezil  Hcl     Stomach cramps    PAST MEDICAL HISTORY Past Medical History:  Diagnosis Date   Chicken pox    Cystocele    Diabetes mellitus without complication (HCC)    Glaucoma    Hypertension    Hypertensive retinopathy    OU   Neuromuscular disorder (HCC)    Dementia    Thyroid  disease    hypothyroidism   Past Surgical History:  Procedure Laterality Date   ABDOMINAL HYSTERECTOMY  1990   TAH BSO   CATARACT EXTRACTION Bilateral    Dr. Reyne Cave   COLONOSCOPY WITH PROPOFOL  N/A 04/08/2021   Procedure: COLONOSCOPY WITH PROPOFOL ;  Surgeon: Baldo Bonds, MD;  Location: [facility];  Service: Endoscopy;  Laterality: N/A;   EYE SURGERY Bilateral    Cat Sx OU   OOPHORECTOMY     BSO   Vaginal Bx     Papilloma   FAMILY HISTORY Family History  Problem Relation Age of Onset   Sickle cell anemia Other    Hypertension Mother    Cancer Father        LIVER   Heart disease Brother  Cancer Brother        STOMACH   SOCIAL HISTORY Social History   Tobacco Use   Smoking status: Never   Smokeless tobacco: Never  Vaping Use   Vaping status: Never Used  Substance Use Topics   Alcohol use: No   Drug use: No       OPHTHALMIC EXAM: Base Eye Exam     Visual Acuity (Snellen - Linear)       Right Left   Dist [place] 20/200 20/50 -1   Dist ph [place] NI NI         Tonometry (Tonopen, 8:55 AM)       Right Left   Pressure 11 13         Pupils       Pupils Dark Light Shape React APD   Right PERRL 3 2 Round Brisk None   Left PERRL 3 2 Round Brisk None         Visual Fields       Left Right    Full Full         Extraocular Movement       Right Left    Full,  Ortho Full, Ortho         Neuro/Psych     Oriented x3: Yes   Mood/Affect: Normal           Slit Lamp and Fundus Exam     Slit Lamp Exam       Right Left   Lids/Lashes Dermatochalasis - upper lid, Meibomian gland dysfunction Dermatochalasis - upper lid, Meibomian gland dysfunction   Conjunctiva/Sclera Melanosis Melanosis   Cornea Arcus, trace Punctate epithelial erosions, tear film debris, 1+ [place], Well healed temporal cataract wound Arcus, Inferior 1+ Punctate epithelial erosions, well healed cataract wound   Anterior Chamber Deep and quiet Deep;  no cell or flare   Iris Round and dilated, No NVI Round and dilated, No NVI   Lens PC IOL in good position, 2+ Posterior capsular opacification Posterior chamber intraocular lens in good position, 1-2+ PCO.   Anterior Vitreous Vitreous syneresis, vitreous condensations, silicone oil bubbles Vitreous syneresis, Vitreous condensations         Fundus Exam       Right Left   Disc Sharp rim, inferior notch, 3-4+ pallor, inf. Rim thinning, attenuated vessels superiorly, fine vascular loops superiorly 2+ pallor, Sharp rim, thin inferior rim, +cupping   C/D Ratio 0.85 0.75   Macula Blunted foveal reflex, persistent cystic changes superior mac -- stably improved; stable improvement in DBH superior mac, central exudates, fibrosis and atrophy -- stable, no frank heme Flat, good foveal reflex, mild RPE mottling and clumping, Epiretinal membrane, No heme or edema   Vessels superior BRVO with severe attenuation of ST arterioles, Tortuous attenuated, mild tortuosity   Periphery Attached, DBH superior hemisphere extended from BRVO -- stably resolved, No heme Attached, mild scattered Reticular degeneration, No heme           Refraction     Wearing Rx       Sphere Cylinder Axis Add   Right -0.75 +1.25 170 +2.50   Left -0.75 +1.00 003 +2.50    Type: PAL         Wearing Rx #2       Sphere Cylinder Axis Add   Right -0.75 +1.25 170  +2.50   Left -0.75 +1.00 003 +2.50    Type: PAL  IMAGING AND PROCEDURES  Imaging and Procedures for 03/07/18  OCT, Retina - OU - Both Eyes       Right Eye Quality was good. Central Foveal Thickness: 213. Progression has improved. Findings include no SRF, abnormal foveal contour, subretinal hyper-reflective material, intraretinal hyper-reflective material, epiretinal membrane, intraretinal fluid, outer retinal atrophy (Interval release of central VMT to partial PVD, Central cystic changes -- improved, central SRHM and patchy ORA; diffuse retinal thinning -- stable).   Left Eye Quality was good. Central Foveal Thickness: 246. Progression has been stable. Findings include no IRF, no SRF, abnormal foveal contour, epiretinal membrane, vitreomacular adhesion (Blunted foveal depression -- stable).   Notes *Images captured and stored on drive  Diagnosis / Impression:  ERM OU OD: BRVO w/ CME -- Interval release of central VMT to partial PVD, Central cystic changes -- improved, central SRHM and patchy ORA; diffuse retinal thinning -- stable OS: very mild ERM with blunted foveal depression -- stable; mild VMA  Clinical management:  See below  Abbreviations: NFP - Normal foveal profile. CME - cystoid macular edema. PED - pigment epithelial detachment. IRF - intraretinal fluid. SRF - subretinal fluid. EZ - ellipsoid zone. ERM - epiretinal membrane. ORA - outer retinal atrophy. ORT - outer retinal tubulation. SRHM - subretinal hyper-reflective material       Intravitreal Injection, Pharmacologic Agent - OD - Right Eye       Time Out 05/02/2024. 9:41 AM. Confirmed correct patient, procedure, site, and patient consented.   Anesthesia Topical anesthesia was used. Anesthetic medications included Lidocaine  2%, Proparacaine 0.5%.   Procedure Preparation included 5% betadine to ocular surface, eyelid speculum. A supplied (32g) needle was used.   Injection: 1.25 mg Bevacizumab   1.25mg /0.35ml   Route: Intravitreal, Site: Right Eye   NDC: [id number], Lot: [id number], Expiration date: 05/25/2024   Post-op Post injection exam found visual acuity of at least counting fingers. The patient tolerated the procedure well. There were no complications. The patient received written and verbal post procedure care education. Post injection medications were not given.             ASSESSMENT/PLAN:   ICD-10-CM   1. Branch retinal vein occlusion of right eye with macular edema  H34.8310 OCT, Retina - OU - Both Eyes    Intravitreal Injection, Pharmacologic Agent - OD - Right Eye    Bevacizumab  (AVASTIN ) SOLN 1.25 mg    2. Diabetes mellitus type 2 without retinopathy (HCC)  E11.9     3. Epiretinal membrane (ERM) of both eyes  H35.373     4. Pseudophakia of both eyes  Z96.1     5. Ocular hypertension of right eye  H40.051     6. Glaucoma suspect of both eyes  H40.003     7. Essential hypertension  I10     8. Hypertensive retinopathy of both eyes  H35.033      1. BRVO with CME OD - lost to follow up from 02.22.22 to 11.1.22 -- re-presented with massive CME and BCVA 20/400 from 20/30  - initial OCT w/ CME superior macula - s/p IVA OD #1 (10.18.19), #2 (11.19.19), #3 (12.17.19), #4 (02.19.20), #5 (03.23.20), #6 (04.21.20), #7 (05.27.20), #8 (07.07.20), #9 (08.25.20), #10 (9.29.20), #11 (11.17.20), #12 (12.22.20), #13 (01.27.21), #14 (03.10.21), #15 (04.21.21), #16 (05.26.21), #17 (06.30.21), #18 (08.04.21), #19 (09.01.21), #20 (10.06.21), #21 (11.17.21), #22 (12.30.21), #23 (2.22.22), #24 (11.1.22), #25 (11.29.22), #26 (01.03.23), #27 (02.14.23), #28 (04.07.23), #29 (05.19.23), #30 (06.20.23), #31 (02.12.25), #32(03.25.25) **history of worsening IRF/cystic changes  at 6+ weeks interval, IVA** - s/p IVE OD #1 (08.11.23), #2 (09.08.23), #3 (10.06.23), #4 (11.06.23), #5 (12.13.23), #6 (01.24.24), #7 (03.27.24), #8 (05.08.24), #9 (06.19.24), #10 (07.31.24), #11 (09.11.24), #12  (10.30.24), #13 (12.18.24)  - BCVA OD 20/200 -- stable - OCT shows interval release of central VMT to partial PVD, Central cystic changes -- improved, central SRHM and patchy ORA; diffuse retinal thinning -- stable at 6 weeks  - recommend IVA OD #33 today, 05.06.25 w/ f/u at 6 wks again   - [employer] funding unavailable  - RBA of procedure discussed, questions answered - informed consent obtained and signed - see procedure note   - Avastin  informed consent form signed and scanned on 02.12.25  - f/u in 6 weeks -- DFE/OCT, possible injection   2. Diabetes mellitus, type 2 without retinopathy - The incidence, risk factors for progression, natural history and treatment options for diabetic retinopathy were discussed with patient.   - The need for close monitoring of blood glucose, blood pressure, and serum lipids, avoiding cigarette or any type of tobacco, and the need for long term follow up was also discussed with patient.    3. Epiretinal membrane, OU  - relatively mild ERM OU -- blunted central foveal depression - OD with mild cystic changes 2/2 BRVO as above; OS without cystic changes or edema  - discussed findings and prognosis   - continue to monitor  4. Pseudophakia OU  - s/p CE/IOL OS 11.13.19 w/ Dr. Reyne Cave  - s/p CE/IOL OD 01.16.20 w/ Dr. Reyne Cave  - beautiful surgeries -- IOLs in perfect position  - healing well post-operatively - IRF/CME OD may have been partly due to post op CME ([place]) in addition to BRVO  5,6. Ocular hypertension / Glaucoma suspect OD>OS  - IOP 11,13  - denies any family hx of Glaucoma   - under the expert management of Dr. Carloyn Chi  - most recent notes reviewed (04.30.24 visit w/ Dr. Carloyn Chi)  - on Cosopt  BID OU and Brimonidine  TID OU per Dr. Carloyn Chi  7,8. Hypertensive retinopathy OU - pt presented to 6.24.2020 visit urgently for right sided headache above right eye with extension to occiput - BP at that time was 201/100 -- pt was sent to primary care clinic  for urgent evaluation and meds were adjusted  - BP now under better control and headaches improved  - BP reading 11.1.22 -- 180/84 -- advised f/u with PCP  - discussed importance of tight BP control  Ophthalmic Meds Ordered this visit:  Meds ordered this encounter  Medications   Bevacizumab  (AVASTIN ) SOLN 1.25 mg     Return in about 6 weeks (around 06/13/2024) for f/u BRVO OD, DFE, OCT.  There are no Patient Instructions on file for this visit.  This document serves as a record of services personally performed by Jeanice Millard, MD, PhD. It was created on their behalf by Olene Berne, COT an ophthalmic technician. The creation of this record is the provider's dictation and/or activities during the visit.    Electronically signed by:  Olene Berne, COT  05/02/24 12:11 PM  This document serves as a record of services personally performed by Jeanice Millard, MD, PhD. It was created on their behalf by Morley Arabia. Bevin Bucks, OA an ophthalmic technician. The creation of this record is the provider's dictation and/or activities during the visit.    Electronically signed by: Morley Arabia. Bevin Bucks, OA 05/02/24 12:11 PM   Jeanice Millard, M.D., Ph.D. Diseases &  Surgery of the Retina and Vitreous [facility]  I have reviewed the above documentation for accuracy and completeness, and I agree with the above. Jeanice Millard, M.D., Ph.D. 05/02/24 12:13 PM   Abbreviations: M myopia (nearsighted); A astigmatism; H hyperopia (farsighted); P presbyopia; Mrx spectacle prescription;  CTL contact lenses; OD right eye; OS left eye; OU both eyes  XT exotropia; ET esotropia; PEK punctate epithelial keratitis; PEE punctate epithelial erosions; DES dry eye syndrome; MGD meibomian gland dysfunction; ATs artificial tears; PFAT's preservative free artificial tears; NSC nuclear sclerotic cataract; PSC posterior subcapsular cataract; ERM epi-retinal membrane; PVD posterior vitreous  detachment; RD retinal detachment; DM diabetes mellitus; DR diabetic retinopathy; NPDR non-proliferative diabetic retinopathy; PDR proliferative diabetic retinopathy; CSME clinically significant macular edema; DME diabetic macular edema; dbh dot blot hemorrhages; CWS cotton wool spot; POAG primary open angle glaucoma; C/D cup-to-disc ratio; HVF humphrey visual field; GVF goldmann visual field; OCT optical coherence tomography; IOP intraocular pressure; BRVO Branch retinal vein occlusion; CRVO central retinal vein occlusion; CRAO central retinal artery occlusion; BRAO branch retinal artery occlusion; RT retinal tear; SB scleral buckle; PPV pars plana vitrectomy; VH Vitreous hemorrhage; PRP panretinal laser photocoagulation; IVK intravitreal kenalog ; VMT vitreomacular traction; MH Macular hole;  NVD neovascularization of the disc; NVE neovascularization elsewhere; AREDS age related eye disease study; ARMD age related macular degeneration; POAG primary open angle glaucoma; EBMD epithelial/anterior basement membrane dystrophy; ACIOL anterior chamber intraocular lens; IOL intraocular lens; PCIOL posterior chamber intraocular lens; Phaco/IOL phacoemulsification with intraocular lens placement; PRK photorefractive keratectomy; LASIK laser assisted in situ keratomileusis; HTN hypertension; DM diabetes mellitus; COPD chronic obstructive pulmonary disease

## 2024-05-02 ENCOUNTER — Encounter (INDEPENDENT_AMBULATORY_CARE_PROVIDER_SITE_OTHER): Payer: Self-pay | Admitting: Ophthalmology

## 2024-05-02 ENCOUNTER — Ambulatory Visit (INDEPENDENT_AMBULATORY_CARE_PROVIDER_SITE_OTHER): Admitting: Ophthalmology

## 2024-05-02 DIAGNOSIS — H34831 Tributary (branch) retinal vein occlusion, right eye, with macular edema: Secondary | ICD-10-CM

## 2024-05-02 DIAGNOSIS — H40051 Ocular hypertension, right eye: Secondary | ICD-10-CM

## 2024-05-02 DIAGNOSIS — Z961 Presence of intraocular lens: Secondary | ICD-10-CM

## 2024-05-02 DIAGNOSIS — H35033 Hypertensive retinopathy, bilateral: Secondary | ICD-10-CM

## 2024-05-02 DIAGNOSIS — H35373 Puckering of macula, bilateral: Secondary | ICD-10-CM | POA: Diagnosis not present

## 2024-05-02 DIAGNOSIS — I10 Essential (primary) hypertension: Secondary | ICD-10-CM | POA: Diagnosis not present

## 2024-05-02 DIAGNOSIS — H40003 Preglaucoma, unspecified, bilateral: Secondary | ICD-10-CM | POA: Diagnosis not present

## 2024-05-02 DIAGNOSIS — E119 Type 2 diabetes mellitus without complications: Secondary | ICD-10-CM

## 2024-05-02 MED ORDER — BEVACIZUMAB CHEMO INJECTION 1.25MG/0.05ML SYRINGE FOR KALEIDOSCOPE
1.2500 mg | INTRAVITREAL | Status: AC | PRN
  Administered 2024-05-02: 1.25 mg via INTRAVITREAL

## 2024-05-23 ENCOUNTER — Encounter: Payer: Self-pay | Admitting: Family Medicine

## 2024-05-23 ENCOUNTER — Ambulatory Visit: Payer: Medicare PPO | Admitting: Family Medicine

## 2024-05-23 VITALS — BP 142/92 | HR 108 | Ht 62.0 in | Wt 123.5 lb

## 2024-05-23 DIAGNOSIS — G301 Alzheimer's disease with late onset: Secondary | ICD-10-CM

## 2024-05-23 DIAGNOSIS — F0280 Dementia in other diseases classified elsewhere without behavioral disturbance: Secondary | ICD-10-CM

## 2024-05-23 MED ORDER — MEMANTINE HCL ER 7 MG PO CP24: 7.0000 mg | ORAL_CAPSULE | Freq: Every day | ORAL | 3 refills | Status: AC

## 2024-05-23 NOTE — Progress Notes (Signed)
 Chief Complaint  Patient presents with   RM2/MEMORY    Pt is here with her niece. Pt's niece states that pt had a fall last week either on Wednesday or Friday. Pt's niece states that pt's memory has declined some, pt is doing a lot of repeating things. Pt's niece states pt is Sundowning some. Pt's appetite has been okay. Pt is still able to dress herself, do laundry, pt cooks breakfast. Pt's niece states that pt will ask about or bring up people that has passed away and has to be reminded that they have passed.     HISTORY OF PRESENT ILLNESS:  05/23/24 ALL:  Alejandra Davis returns for follow up for AD. She was last seen 10/2023 and doing fairly well on memantine  XR 7mg  daily. She presents with her niece, Alejandra Davis, today who aids in history. Cognitive function seems fairly stable. More repetitive questioning at times. She has had more difficulty with sundowning. Seems more agitated about finances at times. Doesn't remember being retired. She continues to be independent with ADLs. She is able to take meds with assistance. Sleeping well. Alejandra Davis continues to sit with her during the day.   11/03/2023 ALL:  Alejandra Davis returns for follow up for AD. She was last seen 03/2023 and doing well on memantine  XR 7mg  daily. Since, she feels memory is stable. She presents with Alejandra Davis, caregiver, today. Daughter, Alejandra Davis, is with us  via telephone and reports she is doing fairly well. She does have more confusion at night. She will call her daughter on the phone but does not seem to wonder. They have cameras to monitor. She has a two caregivers during the day from about 8a-9p. She does stay alone at night. Her son, Alejandra Davis, and her niece live close by. She seems to be eating well. Caregivers prepare meals and assist with meds. She is usually able to perform ADLs. Sleep waxes and wanes. She is followed by PCP.   04/14/2023 ALL:  Alejandra Davis returns for follow up for AD. She continues memantine  XR 7mg  daily. Unable to tolerate higher dose. She  presents with her friend who drove her to appt but waits in lobby. Alejandra Davis feels she is doing well. She reports taking medicaitons as listed on MAR, however, when specifically asked about memantine  she is not able to confirm she is taking. She reports being able to perform ADLs. She lives alone. She has a home care aide 10-5 W-F and 10-2 on M-T. Her daughter and son rotate every other weekend. She is sleeping well and appetite seems normal. She loves to do crossword and word finding puzzles. She was going to [facility] but hasn't been recently. She does not drive.  10/05/2022 ALL: Alejandra Davis is a 88 y.o. female here today for follow up for AD. She was last seen by Dr Omar Bibber 02/2022 and memantine  XR increased to 14mg  daily. Unable to tolerate donepezil  in the past. She returns with her daughter, Alejandra Davis, who aids in history. Alejandra Davis reports some progression in short term memory loss. Alejandra Davis feels that she is doing fairly well. Alejandra Davis was hesitant to increase menantine dose. Alejandra Davis has continued 7mg  daily and tolerating well. She continues to live alone. She is independent in ADLs. She is able to manage medications without difficulty. She has a home care aide 10-5 W-F and 10-2 on M-T. Her daughter and son rotate every other weekend. She is sleeping well and appetite seems normal. She loves to do crossword and word finding puzzles. She was going to  [employer] but hasn't been recently. She does not drive.   HISTORY (copied from Dr Dail Drought previous note)  Alejandra Davis is an 88 year old right-handed woman with an underlying medical history of diabetes, hypertension, branch retinal vein occlusion, macular edema, hearing loss, and hypothyroidism, who presents for follow-up consultation of her memory loss.  The patient is accompanied by her Aide, Alejandra Davis, today, and presents after a long gap of over 2 years.  I last saw her on 08/29/2019, at which time she was on Aricept  5 mg daily, had occasional GI complaints including stomach cramping  and nausea, no vomiting or diarrhea.  She had weight loss.  She was advised to follow-up with her primary care to make sure there is no medical reason for her weight loss.  She was advised to increase her donepezil  to 10 mg daily.  I had previously talked her about pursuing a sleep study but she did not wish to pursue this.   Her daughter called in the interim in September 2020 reporting that she significant side effects from the donepezil  and she was therefore advised to taper off of it.   The patient had a follow-up appointment with Kenlie Seki, NP on 11/28/2019, at which time she was accompanied by her niece.  Her side effects had resolved after stopping the donepezil .  Her MMSE was 23 at the time.  The addition of Namenda  was discussed but it was not started due to her family being concerned about side effects.    She did not return for follow-up since then.   She had an interim evaluation as per PCP request, with Dr. Elmer Hackney, neuropsychologist, in February 2022 and his impression was <<Diagnostic Impressions: Alzheimer's disease, late onset Cerebrovascular small vessel disease>>. She had a feedback appointment with Dr. Annette Barters in February 2022.   She was advised no longer to drive.   Today, 03/19/2022: She reports feeling okay, she is not able to provide much in the way of details of her history.  She denies any pain, any depression or stress.  She has not fallen.  Per Alejandra Davis she does her own laundry and also cooks some.  Alejandra Davis is there from 10 AM to 2 PM daily, Mondays through Fridays, there is another caretaker that is there from 4 PM to 7 PM daily, nobody stays with her overnight.  There have been no reports of falls, no wandering, no behavioral escalations, sometimes patient gets frustrated at times if she does not remember something.  No anger outbursts.  She tries to hydrate well, drinks 8 ounce bottles of water, typically 3 bottles well Alejandra Davis is there.  She has not driven a car in the  months as far as her caretaker knows.  Patient reports that she drove her car to the grocery store in the afternoon some 2 weeks ago. This is not confirmed by the caretaker.  I reviewed her office visit note with her primary care on 11/18/2021.  She had blood work at the time which I reviewed: BUN was elevated at 28, creatinine elevated at 1.37.  She had a total cholesterol of 216, thyroid  function was benign, LDL 112.  She was started on memantine  ER 7 mg strength in late November 2022 as I understand.  She seems to tolerate this well, her caretaker reports occasional diarrhea.   REVIEW OF SYSTEMS: Out of a complete 14 system review of symptoms, the patient complains only of the following symptoms, memory loss, and all other reviewed systems are negative.   ALLERGIES:  Allergies  Allergen Reactions   Donepezil  Hcl     Stomach cramps      HOME MEDICATIONS: Outpatient Medications Prior to Visit  Medication Sig Dispense Refill   amLODipine  (NORVASC ) 10 MG tablet TAKE 1 TABLET(10 MG) BY MOUTH DAILY 90 tablet 1   ketoconazole (NIZORAL) 2 % shampoo Apply topically.     levothyroxine  (SYNTHROID ) 75 MCG tablet Take 1 tablet (75 mcg total) by mouth daily. 90 tablet 1   lisinopril  (ZESTRIL ) 2.5 MG tablet Take 1 tablet (2.5 mg total) by mouth daily. 90 tablet 1   metoprolol  tartrate (LOPRESSOR ) 50 MG tablet TAKE 1 TABLET(50 MG) BY MOUTH DAILY 90 tablet 1   memantine  (NAMENDA  XR) 7 MG CP24 24 hr capsule Take 1 capsule (7 mg total) by mouth daily. 90 capsule 0   ASPIRIN  LOW DOSE 81 MG EC tablet Take 81 mg by mouth daily. (Patient not taking: Reported on 05/23/2024)     dorzolamide -timolol  (COSOPT ) 22.3-6.8 MG/ML ophthalmic solution Place 1 drop into both eyes 2 (two) times daily. (Patient not taking: Reported on 05/23/2024)     loratadine  (CLARITIN ) 10 MG tablet TAKE 1 TABLET(10 MG) BY MOUTH DAILY (Patient not taking: Reported on 05/23/2024) 90 tablet 1   No facility-administered medications prior to  visit.     PAST MEDICAL HISTORY: Past Medical History:  Diagnosis Date   Chicken pox    Cystocele    Diabetes mellitus without complication (HCC)    Glaucoma    Hypertension    Hypertensive retinopathy    OU   Neuromuscular disorder (HCC)    Dementia    Thyroid  disease    hypothyroidism     PAST SURGICAL HISTORY: Past Surgical History:  Procedure Laterality Date   ABDOMINAL HYSTERECTOMY  1990   TAH BSO   CATARACT EXTRACTION Bilateral    Dr. Reyne Cave   COLONOSCOPY WITH PROPOFOL  N/A 04/08/2021   Procedure: COLONOSCOPY WITH PROPOFOL ;  Surgeon: Baldo Bonds, MD;  Location: [facility];  Service: Endoscopy;  Laterality: N/A;   EYE SURGERY Bilateral    Cat Sx OU   OOPHORECTOMY     BSO   Vaginal Bx     Papilloma     FAMILY HISTORY: Family History  Problem Relation Age of Onset   Hypertension Mother    Memory loss Mother    Cancer Father        LIVER   Heart disease Brother    Cancer Brother        STOMACH   Sickle cell anemia Other      SOCIAL HISTORY: Social History   Socioeconomic History   Marital status: Divorced    Spouse name: Not on file   Number of children: Not on file   Years of education: Not on file   Highest education level: Not on file  Occupational History   Not on file  Tobacco Use   Smoking status: Never   Smokeless tobacco: Never  Vaping Use   Vaping status: Never Used  Substance and Sexual Activity   Alcohol use: No   Drug use: No   Sexual activity: Not Currently    Birth control/protection: Surgical  Other Topics Concern   Not on file  Social History Narrative   Social History      Diet? n/a      Do you drink/eat things with caffeine? Yes   Marital status?      Divorced  What year were you married? 1962      Do you live in a house, apartment, assisted living, condo, trailer, etc.? house      Is it one or more stories? One story      How many persons live in your home? 1      Do you  have any pets in your home? (please list) none       Highest level of education completed? 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               sometimes                       Type & how often? Walking       Advanced Directives      Do you have a living will? No       Do you have a DNR form?                                  If not, do you want to discuss one?  No       Do you have signed POA/HPOA for forms?       Functional Status      Do you have difficulty bathing or dressing yourself? No       Do you have difficulty preparing food or eating? No       Do you have difficulty managing your medications? No       Do you have difficulty managing your finances? No       Do you have difficulty affording your medications? No       Social Drivers of Corporate investment banker Strain: Not on file  Food Insecurity: Not on file  Transportation Needs: Not on file  Physical Activity: Not on file  Stress: Not on file  Social Connections: Not on file  Intimate Partner Violence: Not on file     PHYSICAL EXAM  Vitals:   05/23/24 1456  BP: (!) 142/92  Pulse: (!) 108  Weight: 123 lb 8 oz (56 kg)  Height: 5\' 2"  (1.575 m)      Body mass index is 22.59 kg/m.  Generalized: Well developed, in no acute distress  Cardiology: normal rate and rhythm, no murmur auscultated  Respiratory: clear to auscultation bilaterally    Neurological examination  Mentation: Alert, not oriented to time but is oriented to place and some history taking. Follows all commands speech and language fluent Cranial nerve II-XII: Pupils were equal round reactive to light. Extraocular movements were full, visual field were full on confrontational test. Facial sensation and strength were normal. Head turning and shoulder shrug  were normal and symmetric. Motor: The motor  testing reveals 5 over 5 strength of bilateral upper ext, 4+/5 bilateral lowers. Good symmetric motor tone is noted throughout.  Sensory: Sensory testing is intact to soft touch on all 4 extremities. No evidence of extinction is noted.  Coordination: Cerebellar testing reveals good finger-nose-finger and heel-to-shin bilaterally.  Gait and station: Gait is normal.    DIAGNOSTIC DATA (LABS, IMAGING, TESTING) - I reviewed patient records, labs, notes, testing and imaging myself where available.  Lab Results  Component Value Date   WBC 6.6  11/06/2022   HGB 13.9 11/06/2022   HCT 43.2 11/06/2022   MCV 81.5 11/06/2022   PLT 261 11/06/2022      Component Value Date/Time   NA 142 11/06/2022 1520   K 3.9 11/06/2022 1520   CL 104 11/06/2022 1520   CO2 24 11/06/2022 1520   GLUCOSE 85 11/06/2022 1520   BUN 24 (H) 11/06/2022 1520   CREATININE 1.29 (H) 11/06/2022 1520   CREATININE 1.52 (H) 07/21/2021 0808   CALCIUM  10.5 (H) 11/06/2022 1520   PROT 7.4 07/22/2021 1932   ALBUMIN 3.8 07/22/2021 1932   AST 20 07/22/2021 1932   ALT 13 07/22/2021 1932   ALKPHOS 47 07/22/2021 1932   BILITOT 0.2 (L) 07/22/2021 1932   GFRNONAA 41 (L) 11/06/2022 1520   GFRNONAA 33 (L) 04/22/2021 1056   GFRAA 38 (L) 04/22/2021 1056   Lab Results  Component Value Date   CHOL 188 07/21/2021   HDL 74 07/21/2021   LDLCALC 97 07/21/2021   TRIG 80 07/21/2021   CHOLHDL 2.5 07/21/2021   Lab Results  Component Value Date   HGBA1C 5.9 (H) 07/21/2021   No results found for: "VITAMINB12" Lab Results  Component Value Date   TSH 1.566 07/22/2021       05/23/2024    3:00 PM 11/03/2023   10:17 AM 04/14/2023   10:45 AM  MMSE - Mini Mental State Exam  Not completed: Unable to complete;Refused    Orientation to time  1 0  Orientation to Place  5 5  Registration  3 3  Attention/ Calculation  1 2  Recall  0 0  Language- name 2 objects  2 2  Language- repeat  1 1  Language- follow 3 step command  3 3  Language-  read & follow direction  1 1  Write a sentence  1 1  Copy design  1 1  Total score  19 19         No data to display           ASSESSMENT AND PLAN  88 y.o. year old female  has a past medical history of Chicken pox, Cystocele, Diabetes mellitus without complication (HCC), Glaucoma, Hypertension, Hypertensive retinopathy, Neuromuscular disorder (HCC), and Thyroid  disease. here with    Late onset Alzheimer dementia, unspecified dementia severity, unspecified whether behavioral, psychotic, or mood disturbance or anxiety (HCC)  Alejandra Davis is doing fairly well, today. We discussed intermittent sundowning. She remains independent with ADLs but does have a health care aid that comes regularly and her children check on her on the weekends. She was unable to tolerate increased dose of memantine  and unable to tolerate donepezil . She will continue memantine  XR to 7mg  daily. Consider melatonin PRN or could consider adding escitalopram daily. Family will discuss and let me know. She will call for any concerns. Memory compensation strategies reviewed. Healthy lifestyle habits encouraged. She will follow up with PCP as directed. She will return to see me in 6-12 months, sooner if needed. She verbalizes understanding and agreement with this plan.    No orders of the defined types were placed in this encounter.    Meds ordered this encounter  Medications   memantine  (NAMENDA  XR) 7 MG CP24 24 hr capsule    Sig: Take 1 capsule (7 mg total) by mouth daily.    Dispense:  90 capsule    Refill:  3    Supervising Provider:   Glory Larsen [id number]    I spent  30 minutes of face-to-face and non-face-to-face time with patient.  This included previsit chart review, lab review, study review, order entry, electronic health record documentation, patient education.   Terrilyn Fick, MSN, FNP-C 05/23/2024, 3:50 PM  [facility] [address], [address] [place], [place] [address] [phone]

## 2024-05-23 NOTE — Patient Instructions (Signed)
 Below is our plan:  We will continue memantine  XR 7mg  daily. We can consider adding melatonin at dinner time. If not effective we could consider escitalopram 5mg  daily.   Please make sure you are staying well hydrated. I recommend 50-60 ounces daily. Well balanced diet and regular exercise encouraged. Consistent sleep schedule with 6-8 hours recommended.   Please continue follow up with care team as directed.   Follow up with me in 6 months   You may receive a survey regarding today's visit. I encourage you to leave honest feed back as I do use this information to improve patient care. Thank you for seeing me today!   Management of Memory Problems   There are some general things you can do to help manage your memory problems.  Your memory may not in fact recover, but by using techniques and strategies you will be able to manage your memory difficulties better.   1)  Establish a routine. Try to establish and then stick to a regular routine.  By doing this, you will get used to what to expect and you will reduce the need to rely on your memory.  Also, try to do things at the same time of day, such as taking your medication or checking your calendar first thing in the morning. Think about think that you can do as a part of a regular routine and make a list.  Then enter them into a daily planner to remind you.  This will help you establish a routine.   2)  Organize your environment. Organize your environment so that it is uncluttered.  Decrease visual stimulation.  Place everyday items such as keys or cell phone in the same place every day (ie.  Basket next to front door) Use post it notes with a brief message to yourself (ie. Turn off light, lock the door) Use labels to indicate where things go (ie. Which cupboards are for food, dishes, etc.) Keep a notepad and pen by the telephone to take messages   3)  Memory Aids A diary or journal/notebook/daily planner Making a list (shopping list,  chore list, to do list that needs to be done) Using an alarm as a reminder (kitchen timer or cell phone alarm) Using cell phone to store information (Notes, Calendar, Reminders) Calendar/White board placed in a prominent position Post-it notes   In order for memory aids to be useful, you need to have good habits.  It's no good remembering to make a note in your journal if you don't remember to look in it.  Try setting aside a certain time of day to look in journal.   4)  Improving mood and managing fatigue. There may be other factors that contribute to memory difficulties.  Factors, such as anxiety, depression and tiredness can affect memory. Regular gentle exercise can help improve your mood and give you more energy. Exercise: there are short videos created by the [employer] on Health specially for older adults: https://bit.ly/2I30q97.  Mediterranean diet: which emphasizes fruits, vegetables, whole grains, legumes, fish, and other seafood; unsaturated fats such as olive oils; and low amounts of red meat, eggs, and sweets. A variation of this, called MIND (Mediterranean-DASH Intervention for Neurodegenerative Delay) incorporates the DASH (Dietary Approaches to Stop Hypertension) diet, which has been shown to lower high blood pressure, a risk factor for Alzheimer's disease. More information at: ExitMarketing.de.  Aerobic exercise that improve heart health is also good for the mind.  [employer] on Aging have short  videos for exercises that you can do at home: BlindWorkshop.com.pt Simple relaxation techniques may help relieve symptoms of anxiety Try to get back to completing activities or hobbies you enjoyed doing in the past. Learn to pace yourself through activities to decrease fatigue. Find out about some local support groups where you can share experiences with others. Try and achieve 7-8 hours of sleep  at night.   Resources for Family/Caregiver  Online caregiver support groups can be found at WesternTunes.it or call Alzheimer's Association's 24/7 hotline: 316-882-4662. [facility] offers in-person, virtual support groups and individual counseling for both care partners and persons with memory loss. Call for more information at [phone].   Advanced care plan: there are two types of Power of Attorney: healthcare and durable. Healthcare POA is a designated person to make healthcare decisions on your behalf if you were too sick to make them yourself. This person can be selected and documented by your physician. Durable POA has to be set up with a lawyer who takes charge of your finances and estate if you were too sick or cognitively impaired to manage your finances accurately. You can find a local Elder Therapist, art here: [place]Ranch.at.  Check out www.planyourlifespan.org, which will help you plan before a crisis and decide who will take care of life considerations in a circumstance where you may not be able to speak for yourself.   Helpful books (available on [employer] or your local bookstore):  By Dr. Albina Hull: Keeping Love Alive as Memories Fade: The 5 Love Languages and the Alzheimer's Journey Sep 28, 2015 The Dementia Care Partner's Workbook: A Guide for Understanding, Education, and [employer] - May 28, 2018.  Both available for less than $15.   "Coping with behavior change in dementia: a family caregiver's guide" by Asberry Lav & Leonides Ramp "A Caregiver's Guide to Dementia: Using Activities and Other Strategies to Prevent, Reduce and Manage Behavioral Symptoms" by Beth Brooke Gitlin and Catherine Piersol.  Youth worker of Joy for the Person with Alzheimer's or Dementia" 4th edition by Hardy Lia  Caregiver videos on common behaviors related to dementia: PopulationGame.pl  [place] Caregiver Portal: free to sign up,  links to local resources: [email or web address]-caregivers.[email or web address]/login

## 2024-06-05 NOTE — Progress Notes (Signed)
 [facility] - Clinic Note  06/14/2024    CHIEF COMPLAINT Patient presents for Retina Follow Up  HISTORY OF PRESENT ILLNESS: Alejandra Davis is a 88 y.o. female who presents to the clinic today for:   HPI     Retina Follow Up   Patient presents with  Wet AMD.  In both eyes.  This started 6 weeks ago.  I, the attending physician,  performed the HPI with the patient and updated documentation appropriately.        Comments   Patient here for 6 weeks retina follow up for exu ARMD OU/BRVO OD. Patient states vision doing ok. No eye pain.       Last edited by Ronelle Coffee, MD on 06/14/2024 12:22 PM.    Pt states vision is the same  Referring physician: No referring provider defined for this encounter.  HISTORICAL INFORMATION:   Selected notes from the MEDICAL RECORD NUMBER Referred by Dr. Burrell Casa for DM exam   CURRENT MEDICATIONS: No current outpatient medications on file. (Ophthalmic Drugs)   No current facility-administered medications for this visit. (Ophthalmic Drugs)   Current Outpatient Medications (Other)  Medication Sig   amLODipine  (NORVASC ) 10 MG tablet TAKE 1 TABLET(10 MG) BY MOUTH DAILY   ketoconazole (NIZORAL) 2 % shampoo Apply topically.   levothyroxine  (SYNTHROID ) 75 MCG tablet Take 1 tablet (75 mcg total) by mouth daily.   lisinopril  (ZESTRIL ) 2.5 MG tablet Take 1 tablet (2.5 mg total) by mouth daily.   memantine  (NAMENDA  XR) 7 MG CP24 24 hr capsule Take 1 capsule (7 mg total) by mouth daily.   metoprolol  tartrate (LOPRESSOR ) 50 MG tablet TAKE 1 TABLET(50 MG) BY MOUTH DAILY   ASPIRIN  LOW DOSE 81 MG EC tablet Take 81 mg by mouth daily. (Patient not taking: Reported on 06/14/2024)   No current facility-administered medications for this visit. (Other)   REVIEW OF SYSTEMS: ROS   Positive for: Skin, Genitourinary, Musculoskeletal, Endocrine, Cardiovascular, Eyes Negative for: Constitutional, Gastrointestinal, Neurological, HENT,  Respiratory, Psychiatric, Allergic/Imm, Heme/Lymph Last edited by Sylvan Evener, COA on 06/14/2024  9:58 AM.     ALLERGIES Allergies  Allergen Reactions   Donepezil  Hcl     Stomach cramps    PAST MEDICAL HISTORY Past Medical History:  Diagnosis Date   Chicken pox    Cystocele    Diabetes mellitus without complication (HCC)    Glaucoma    Hypertension    Hypertensive retinopathy    OU   Neuromuscular disorder (HCC)    Dementia    Thyroid  disease    hypothyroidism   Past Surgical History:  Procedure Laterality Date   ABDOMINAL HYSTERECTOMY  1990   TAH BSO   CATARACT EXTRACTION Bilateral    Dr. Reyne Cave   COLONOSCOPY WITH PROPOFOL  N/A 04/08/2021   Procedure: COLONOSCOPY WITH PROPOFOL ;  Surgeon: Baldo Bonds, MD;  Location: [facility];  Service: Endoscopy;  Laterality: N/A;   EYE SURGERY Bilateral    Cat Sx OU   OOPHORECTOMY     BSO   Vaginal Bx     Papilloma   FAMILY HISTORY Family History  Problem Relation Age of Onset   Hypertension Mother    Memory loss Mother    Cancer Father        LIVER   Heart disease Brother    Cancer Brother        STOMACH   Sickle cell anemia Other    SOCIAL HISTORY Social History   Tobacco  Use   Smoking status: Never   Smokeless tobacco: Never  Vaping Use   Vaping status: Never Used  Substance Use Topics   Alcohol use: No   Drug use: No       OPHTHALMIC EXAM: Base Eye Exam     Visual Acuity (Snellen - Linear)       Right Left   Dist cc 20/200 +2 20/80 -2   Dist ph cc NI 20/50    Correction: Glasses         Tonometry (Tonopen, 9:55 AM)       Right Left   Pressure 18 16         Pupils       Dark Light Shape React APD   Right 3 2 Round Brisk None   Left 3 2 Round Brisk None         Visual Fields (Counting fingers)       Left Right    Full Full         Extraocular Movement       Right Left    Full, Ortho Full, Ortho         Neuro/Psych     Oriented x3: Yes   Mood/Affect:  Normal         Dilation     Both eyes: 1.0% Mydriacyl, 2.5% Phenylephrine @ 9:55 AM           Slit Lamp and Fundus Exam     Slit Lamp Exam       Right Left   Lids/Lashes Dermatochalasis - upper lid, Meibomian gland dysfunction Dermatochalasis - upper lid, Meibomian gland dysfunction   Conjunctiva/Sclera Melanosis Melanosis   Cornea Arcus, trace Punctate epithelial erosions, tear film debris, 1+ Guttata, Well healed temporal cataract wound Arcus, Inferior 1+ Punctate epithelial erosions, well healed cataract wound   Anterior Chamber Deep and quiet Deep;  no cell or flare   Iris Round and dilated, No NVI Round and dilated, No NVI   Lens PC IOL in good position, 2+ Posterior capsular opacification Posterior chamber intraocular lens in good position, 1-2+ PCO.   Anterior Vitreous Vitreous syneresis, vitreous condensations, silicone oil bubbles Vitreous syneresis, Vitreous condensations         Fundus Exam       Right Left   Disc Sharp rim, inferior notch, 3-4+ pallor, inf. Rim thinning, attenuated vessels superiorly, fine vascular loops superiorly 2+ pallor, Sharp rim, thin inferior rim, +cupping   C/D Ratio 0.85 0.75   Macula Blunted foveal reflex, persistent cystic changes superior mac -- stably improved; stable improvement in DBH superior mac, central exudates, fibrosis and atrophy -- stable, no frank heme Flat, good foveal reflex, mild RPE mottling and clumping, Epiretinal membrane, No heme or edema   Vessels superior BRVO with severe attenuation of ST arterioles, Tortuous attenuated, mild tortuosity   Periphery Attached, DBH superior hemisphere extended from BRVO -- stably resolved, No heme Attached, mild scattered Reticular degeneration, No heme           Refraction     Wearing Rx       Sphere Cylinder Axis Add   Right -0.75 +1.25 170 +2.50   Left -0.75 +1.00 003 +2.50    Type: PAL         Wearing Rx #2       Sphere Cylinder Axis Add   Right -0.75 +1.25  170 +2.50   Left -0.75 +1.00 003 +2.50    Type: PAL  IMAGING AND PROCEDURES  Imaging and Procedures for 03/07/18  OCT, Retina - OU - Both Eyes       Right Eye Quality was good. Central Foveal Thickness: 227. Progression has been stable. Findings include no SRF, abnormal foveal contour, subretinal hyper-reflective material, intraretinal hyper-reflective material, epiretinal membrane, intraretinal fluid, outer retinal atrophy (stable release of central VMT to partial PVD, stable improvement in central cystic changes, central SRHM and patchy ORA; diffuse retinal thinning -- stable).   Left Eye Quality was good. Central Foveal Thickness: 252. Progression has been stable. Findings include no IRF, no SRF, abnormal foveal contour, epiretinal membrane, vitreomacular adhesion (Blunted foveal depression -- stable).   Notes *Images captured and stored on drive  Diagnosis / Impression:  ERM OU OD: BRVO w/ CME -- stable release of central VMT to partial PVD, stable improvement in central cystic changes, central SRHM and patchy ORA; diffuse retinal thinning -- stable OS: very mild ERM with blunted foveal depression -- stable; mild VMA  Clinical management:  See below  Abbreviations: NFP - Normal foveal profile. CME - cystoid macular edema. PED - pigment epithelial detachment. IRF - intraretinal fluid. SRF - subretinal fluid. EZ - ellipsoid zone. ERM - epiretinal membrane. ORA - outer retinal atrophy. ORT - outer retinal tubulation. SRHM - subretinal hyper-reflective material       Intravitreal Injection, Pharmacologic Agent - OD - Right Eye       Time Out 06/14/2024. 11:10 AM. Confirmed correct patient, procedure, site, and patient consented.   Anesthesia Topical anesthesia was used. Anesthetic medications included Lidocaine  2%, Proparacaine 0.5%.   Procedure Preparation included 5% betadine to ocular surface, eyelid speculum. A (32g) needle was used.   Injection: 1.25  mg Bevacizumab  1.25mg /0.43ml   Route: Intravitreal, Site: Right Eye   NDC: [id number], Lot: [id number], Expiration date: 08/31/2024   Post-op Post injection exam found visual acuity of at least counting fingers. The patient tolerated the procedure well. There were no complications. The patient received written and verbal post procedure care education. Post injection medications were not given.            ASSESSMENT/PLAN:   ICD-10-CM   1. Branch retinal vein occlusion of right eye with macular edema  H34.8310 OCT, Retina - OU - Both Eyes    Intravitreal Injection, Pharmacologic Agent - OD - Right Eye    Bevacizumab  (AVASTIN ) SOLN 1.25 mg    2. Diabetes mellitus type 2 without retinopathy (HCC)  E11.9     3. Epiretinal membrane (ERM) of both eyes  H35.373     4. Pseudophakia of both eyes  Z96.1     5. Ocular hypertension of right eye  H40.051     6. Glaucoma suspect of both eyes  H40.003     7. Essential hypertension  I10     8. Hypertensive retinopathy of both eyes  H35.033      1. BRVO with CME OD - lost to follow up from 02.22.22 to 11.1.22 -- re-presented with massive CME and BCVA 20/400 from 20/30  - initial OCT w/ CME superior macula - s/p IVA OD #1 (10.18.19), #2 (11.19.19), #3 (12.17.19), #4 (02.19.20), #5 (03.23.20), #6 (04.21.20), #7 (05.27.20), #8 (07.07.20), #9 (08.25.20), #10 (9.29.20), #11 (11.17.20), #12 (12.22.20), #13 (01.27.21), #14 (03.10.21), #15 (04.21.21), #16 (05.26.21), #17 (06.30.21), #18 (08.04.21), #19 (09.01.21), #20 (10.06.21), #21 (11.17.21), #22 (12.30.21), #23 (2.22.22), #24 (11.1.22), #25 (11.29.22), #26 (01.03.23), #27 (02.14.23), #28 (04.07.23), #29 (05.19.23), #30 (06.20.23), #31 (02.12.25), #32(03.25.25), #33 (05.06.25) **history of  worsening IRF/cystic changes at 6+ weeks interval, IVA** - s/p IVE OD #1 (08.11.23), #2 (09.08.23), #3 (10.06.23), #4 (11.06.23), #5 (12.13.23), #6 (01.24.24), #7 (03.27.24), #8 (05.08.24), #9 (06.19.24), #10  (07.31.24), #11 (09.11.24), #12 (10.30.24), #13 (12.18.24)  - BCVA OD 20/200 -- stable - OCT shows interval release of central VMT to partial PVD, stable improvement in central cystic changes, central SRHM and patchy ORA; diffuse retinal thinning -- stable at 6 weeks  - recommend IVA OD #34 today, 06.18.25 w/ f/u at 6 wks again   - Good Days funding unavailable  - RBA of procedure discussed, questions answered - informed consent obtained and signed - see procedure note   - Avastin  informed consent form signed and scanned on 02.12.25  - f/u in 6 weeks -- DFE/OCT, possible injection   2. Diabetes mellitus, type 2 without retinopathy - The incidence, risk factors for progression, natural history and treatment options for diabetic retinopathy were discussed with patient.   - The need for close monitoring of blood glucose, blood pressure, and serum lipids, avoiding cigarette or any type of tobacco, and the need for long term follow up was also discussed with patient.    3. Epiretinal membrane, OU  - relatively mild ERM OU -- blunted central foveal depression - OD with mild cystic changes 2/2 BRVO as above; OS without cystic changes or edema  - discussed findings and prognosis   - continue to monitor  4. Pseudophakia OU  - s/p CE/IOL OS 11.13.19 w/ Dr. Reyne Cave  - s/p CE/IOL OD 01.16.20 w/ Dr. Reyne Cave  - beautiful surgeries -- IOLs in perfect position  - healing well post-operatively - IRF/CME OD may have been partly due to post op CME ([place]) in addition to BRVO  5,6. Ocular hypertension / Glaucoma suspect OD>OS  - IOP 11,13  - denies any family hx of Glaucoma   - under the expert management of Dr. Carloyn Chi  - most recent notes reviewed (04.30.24 visit w/ Dr. Carloyn Chi)  - on Cosopt  BID OU and Brimonidine  TID OU per Dr. Carloyn Chi  7,8. Hypertensive retinopathy OU - pt presented to 6.24.2020 visit urgently for right sided headache above right eye with extension to occiput - BP at that time was  201/100 -- pt was sent to primary care clinic for urgent evaluation and meds were adjusted  - BP now under better control and headaches improved  - BP reading 11.1.22 -- 180/84 -- advised f/u with PCP  - discussed importance of tight BP control  Ophthalmic Meds Ordered this visit:  Meds ordered this encounter  Medications   Bevacizumab  (AVASTIN ) SOLN 1.25 mg     Return in about 6 weeks (around 07/26/2024) for RVO OD, Dilated Exam, OCT, Possible Injxn.  There are no Patient Instructions on file for this visit.  This document serves as a record of services personally performed by Jeanice Millard, MD, PhD. It was created on their behalf by Angelia Kelp, an ophthalmic technician. The creation of this record is the provider's dictation and/or activities during the visit.    Electronically signed by: Angelia Kelp, OA, 06/14/24  12:49 PM  This document serves as a record of services personally performed by Jeanice Millard, MD, PhD. It was created on their behalf by Morley Arabia. Bevin Bucks, OA an ophthalmic technician. The creation of this record is the provider's dictation and/or activities during the visit.    Electronically signed by: Morley Arabia. Bevin Bucks, OA 06/14/24 12:49 PM  Jeanice Millard, M.D., Ph.D.  Diseases & Surgery of the Retina and Vitreous [facility]  I have reviewed the above documentation for accuracy and completeness, and I agree with the above. Jeanice Millard, M.D., Ph.D. 06/14/24 12:51 PM   Abbreviations: M myopia (nearsighted); A astigmatism; H hyperopia (farsighted); P presbyopia; Mrx spectacle prescription;  CTL contact lenses; OD right eye; OS left eye; OU both eyes  XT exotropia; ET esotropia; PEK punctate epithelial keratitis; PEE punctate epithelial erosions; DES dry eye syndrome; MGD meibomian gland dysfunction; ATs artificial tears; PFAT's preservative free artificial tears; NSC nuclear sclerotic cataract; PSC posterior subcapsular cataract; ERM  epi-retinal membrane; PVD posterior vitreous detachment; RD retinal detachment; DM diabetes mellitus; DR diabetic retinopathy; NPDR non-proliferative diabetic retinopathy; PDR proliferative diabetic retinopathy; CSME clinically significant macular edema; DME diabetic macular edema; dbh dot blot hemorrhages; CWS cotton wool spot; POAG primary open angle glaucoma; C/D cup-to-disc ratio; HVF humphrey visual field; GVF goldmann visual field; OCT optical coherence tomography; IOP intraocular pressure; BRVO Branch retinal vein occlusion; CRVO central retinal vein occlusion; CRAO central retinal artery occlusion; BRAO branch retinal artery occlusion; RT retinal tear; SB scleral buckle; PPV pars plana vitrectomy; VH Vitreous hemorrhage; PRP panretinal laser photocoagulation; IVK intravitreal kenalog ; VMT vitreomacular traction; MH Macular hole;  NVD neovascularization of the disc; NVE neovascularization elsewhere; AREDS age related eye disease study; ARMD age related macular degeneration; POAG primary open angle glaucoma; EBMD epithelial/anterior basement membrane dystrophy; ACIOL anterior chamber intraocular lens; IOL intraocular lens; PCIOL posterior chamber intraocular lens; Phaco/IOL phacoemulsification with intraocular lens placement; PRK photorefractive keratectomy; LASIK laser assisted in situ keratomileusis; HTN hypertension; DM diabetes mellitus; COPD chronic obstructive pulmonary disease

## 2024-06-14 ENCOUNTER — Ambulatory Visit (INDEPENDENT_AMBULATORY_CARE_PROVIDER_SITE_OTHER): Admitting: Ophthalmology

## 2024-06-14 ENCOUNTER — Encounter (INDEPENDENT_AMBULATORY_CARE_PROVIDER_SITE_OTHER): Payer: Self-pay | Admitting: Ophthalmology

## 2024-06-14 DIAGNOSIS — H35033 Hypertensive retinopathy, bilateral: Secondary | ICD-10-CM | POA: Diagnosis not present

## 2024-06-14 DIAGNOSIS — H35373 Puckering of macula, bilateral: Secondary | ICD-10-CM

## 2024-06-14 DIAGNOSIS — Z961 Presence of intraocular lens: Secondary | ICD-10-CM | POA: Diagnosis not present

## 2024-06-14 DIAGNOSIS — H34831 Tributary (branch) retinal vein occlusion, right eye, with macular edema: Secondary | ICD-10-CM

## 2024-06-14 DIAGNOSIS — E119 Type 2 diabetes mellitus without complications: Secondary | ICD-10-CM | POA: Diagnosis not present

## 2024-06-14 DIAGNOSIS — H40003 Preglaucoma, unspecified, bilateral: Secondary | ICD-10-CM

## 2024-06-14 DIAGNOSIS — H40051 Ocular hypertension, right eye: Secondary | ICD-10-CM | POA: Diagnosis not present

## 2024-06-14 DIAGNOSIS — I10 Essential (primary) hypertension: Secondary | ICD-10-CM

## 2024-06-14 MED ORDER — BEVACIZUMAB CHEMO INJECTION 1.25MG/0.05ML SYRINGE FOR KALEIDOSCOPE
1.2500 mg | INTRAVITREAL | Status: AC | PRN
  Administered 2024-06-14: 1.25 mg via INTRAVITREAL

## 2024-06-23 ENCOUNTER — Ambulatory Visit: Admitting: Podiatry

## 2024-06-23 ENCOUNTER — Encounter: Payer: Self-pay | Admitting: Podiatry

## 2024-06-23 DIAGNOSIS — M79671 Pain in right foot: Secondary | ICD-10-CM | POA: Diagnosis not present

## 2024-06-23 DIAGNOSIS — M79672 Pain in left foot: Secondary | ICD-10-CM | POA: Diagnosis not present

## 2024-06-23 DIAGNOSIS — B351 Tinea unguium: Secondary | ICD-10-CM | POA: Diagnosis not present

## 2024-06-23 DIAGNOSIS — E119 Type 2 diabetes mellitus without complications: Secondary | ICD-10-CM | POA: Diagnosis not present

## 2024-06-23 NOTE — Progress Notes (Signed)
 Patient presents for evaluation and treatment of tenderness and some redness around nails feet.  Tenderness around toes with walking and wearing shoes.  Physical exam:  General appearance: Alert, pleasant, and in no acute distress.  Vascular: Pedal pulses: DP 2/4 B/L, PT 1/4 B/L.  Mild edema lower legs bilaterally  Neurological:    Dermatologic:  Nails thickened, disfigured, discolored 1-5 BL with subungual debris.  Redness and hypertrophic nail folds along nail folds bilaterally but no signs of drainage or infection.  Musculoskeletal:      Diagnosis: 1. Painful onychomycotic nails 1 through 5 bilaterally. 2. Pain toes 1 through 5 bilaterally. 3.  Diabetes mellitus type 2 without complications  Plan: Debrided onychomycotic nails 1 through 5 bilaterally.  Return 3 months

## 2024-07-06 DIAGNOSIS — E538 Deficiency of other specified B group vitamins: Secondary | ICD-10-CM | POA: Diagnosis not present

## 2024-07-06 DIAGNOSIS — F0390 Unspecified dementia without behavioral disturbance: Secondary | ICD-10-CM | POA: Diagnosis not present

## 2024-07-06 DIAGNOSIS — E113493 Type 2 diabetes mellitus with severe nonproliferative diabetic retinopathy without macular edema, bilateral: Secondary | ICD-10-CM | POA: Diagnosis not present

## 2024-07-06 DIAGNOSIS — I129 Hypertensive chronic kidney disease with stage 1 through stage 4 chronic kidney disease, or unspecified chronic kidney disease: Secondary | ICD-10-CM | POA: Diagnosis not present

## 2024-07-06 DIAGNOSIS — N1832 Chronic kidney disease, stage 3b: Secondary | ICD-10-CM | POA: Diagnosis not present

## 2024-07-06 DIAGNOSIS — E2839 Other primary ovarian failure: Secondary | ICD-10-CM | POA: Diagnosis not present

## 2024-07-12 DIAGNOSIS — Z20828 Contact with and (suspected) exposure to other viral communicable diseases: Secondary | ICD-10-CM | POA: Diagnosis not present

## 2024-07-20 NOTE — Progress Notes (Shared)
 [facility] - Clinic Note  07/26/2024    CHIEF COMPLAINT Patient presents for No chief complaint on file.  HISTORY OF PRESENT ILLNESS: Alejandra Davis is a 88 y.o. female who presents to the clinic today for:    Pt states   Referring physician: No referring provider defined for this encounter.  HISTORICAL INFORMATION:   Selected notes from the MEDICAL RECORD NUMBER Referred by Dr. MICAEL Lent for DM exam   CURRENT MEDICATIONS: No current outpatient medications on file. (Ophthalmic Drugs)   No current facility-administered medications for this visit. (Ophthalmic Drugs)   Current Outpatient Medications (Other)  Medication Sig   amLODipine  (NORVASC ) 10 MG tablet TAKE 1 TABLET(10 MG) BY MOUTH DAILY   ASPIRIN  LOW DOSE 81 MG EC tablet Take 81 mg by mouth daily.   ketoconazole (NIZORAL) 2 % shampoo Apply topically.   levothyroxine  (SYNTHROID ) 75 MCG tablet Take 1 tablet (75 mcg total) by mouth daily.   lisinopril  (ZESTRIL ) 2.5 MG tablet Take 1 tablet (2.5 mg total) by mouth daily.   memantine  (NAMENDA  XR) 7 MG CP24 24 hr capsule Take 1 capsule (7 mg total) by mouth daily.   metoprolol  tartrate (LOPRESSOR ) 50 MG tablet TAKE 1 TABLET(50 MG) BY MOUTH DAILY   No current facility-administered medications for this visit. (Other)   REVIEW OF SYSTEMS:   ALLERGIES Allergies  Allergen Reactions   Donepezil  Hcl     Stomach cramps    PAST MEDICAL HISTORY Past Medical History:  Diagnosis Date   Chicken pox    Cystocele    Diabetes mellitus without complication (HCC)    Glaucoma    Hypertension    Hypertensive retinopathy    OU   Neuromuscular disorder (HCC)    Dementia    Thyroid  disease    hypothyroidism   Past Surgical History:  Procedure Laterality Date   ABDOMINAL HYSTERECTOMY  1990   TAH BSO   CATARACT EXTRACTION Bilateral    Dr. Lelon   COLONOSCOPY WITH PROPOFOL  N/A 04/08/2021   Procedure: COLONOSCOPY WITH PROPOFOL ;  Surgeon: Dianna Specking,  MD;  Location: [facility];  Service: Endoscopy;  Laterality: N/A;   EYE SURGERY Bilateral    Cat Sx OU   OOPHORECTOMY     BSO   Vaginal Bx     Papilloma   FAMILY HISTORY Family History  Problem Relation Age of Onset   Hypertension Mother    Memory loss Mother    Cancer Father        LIVER   Heart disease Brother    Cancer Brother        STOMACH   Sickle cell anemia Other    SOCIAL HISTORY Social History   Tobacco Use   Smoking status: Never   Smokeless tobacco: Never  Vaping Use   Vaping status: Never Used  Substance Use Topics   Alcohol use: No   Drug use: No       OPHTHALMIC EXAM: Not recorded    IMAGING AND PROCEDURES  Imaging and Procedures for 03/07/18          ASSESSMENT/PLAN: No diagnosis found.  1. BRVO with CME OD - lost to follow up from 02.22.22 to 11.1.22 -- re-presented with massive CME and BCVA 20/400 from 20/30  - initial OCT w/ CME superior macula - s/p IVA OD #1 (10.18.19), #2 (11.19.19), #3 (12.17.19), #4 (02.19.20), #5 (03.23.20), #6 (04.21.20), #7 (05.27.20), #8 (07.07.20), #9 (08.25.20), #10 (9.29.20), #11 (11.17.20), #12 (12.22.20), #13 (01.27.21), #14 (  03.10.21), #15 (04.21.21), #16 (05.26.21), #17 (06.30.21), #18 (08.04.21), #19 (09.01.21), #20 (10.06.21), #21 (11.17.21), #22 (12.30.21), #23 (2.22.22), #24 (11.1.22), #25 (11.29.22), #26 (01.03.23), #27 (02.14.23), #28 (04.07.23), #29 (05.19.23), #30 (06.20.23), #31 (02.12.25), #32(03.25.25), #33 (05.06.25), #34 (06.18.25) **history of worsening IRF/cystic changes at 6+ weeks interval, IVA** - s/p IVE OD #1 (08.11.23), #2 (09.08.23), #3 (10.06.23), #4 (11.06.23), #5 (12.13.23), #6 (01.24.24), #7 (03.27.24), #8 (05.08.24), #9 (06.19.24), #10 (07.31.24), #11 (09.11.24), #12 (10.30.24), #13 (12.18.24)  - BCVA OD 20/200 -- stable - OCT shows interval release of central VMT to partial PVD, stable improvement in central cystic changes, central SRHM and patchy ORA; diffuse retinal thinning --  stable at 6 weeks  - recommend IVA OD #35 today, 07.30.25 w/ f/u at 6 wks again   - Good Days funding unavailable  - RBA of procedure discussed, questions answered - informed consent obtained and signed - see procedure note   - Avastin  informed consent form signed and scanned on 02.12.25  - f/u in 6 weeks -- DFE/OCT, possible injection   2. Diabetes mellitus, type 2 without retinopathy - The incidence, risk factors for progression, natural history and treatment options for diabetic retinopathy were discussed with patient.   - The need for close monitoring of blood glucose, blood pressure, and serum lipids, avoiding cigarette or any type of tobacco, and the need for long term follow up was also discussed with patient.    3. Epiretinal membrane, OU  - relatively mild ERM OU -- blunted central foveal depression - OD with mild cystic changes 2/2 BRVO as above; OS without cystic changes or edema  - discussed findings and prognosis   - continue to monitor  4. Pseudophakia OU  - s/p CE/IOL OS 11.13.19 w/ Dr. Lelon  - s/p CE/IOL OD 01.16.20 w/ Dr. Lelon  - beautiful surgeries -- IOLs in perfect position  - healing well post-operatively - IRF/CME OD may have been partly due to post op CME ([place]) in addition to BRVO  5,6. Ocular hypertension / Glaucoma suspect OD>OS  - IOP 11,13  - denies any family hx of Glaucoma   - under the expert management of Dr. Fleeta  - most recent notes reviewed (04.30.24 visit w/ Dr. Fleeta)  - on Cosopt  BID OU and Brimonidine  TID OU per Dr. Fleeta  7,8. Hypertensive retinopathy OU - pt presented to 6.24.2020 visit urgently for right sided headache above right eye with extension to occiput - BP at that time was 201/100 -- pt was sent to primary care clinic for urgent evaluation and meds were adjusted  - BP now under better control and headaches improved  - BP reading 11.1.22 -- 180/84 -- advised f/u with PCP  - discussed importance of tight BP  control  Ophthalmic Meds Ordered this visit:  No orders of the defined types were placed in this encounter.    No follow-ups on file.  There are no Patient Instructions on file for this visit.  This document serves as a record of services personally performed by Redell JUDITHANN Hans, MD, PhD. It was created on their behalf by Almetta Pesa, an ophthalmic technician. The creation of this record is the provider's dictation and/or activities during the visit.    Electronically signed by: Almetta Pesa, OA, 07/20/24  12:41 PM   Redell JUDITHANN Hans, M.D., Ph.D. Diseases & Surgery of the Retina and Vitreous [facility]   Abbreviations: M myopia (nearsighted); A astigmatism; H hyperopia (farsighted); P presbyopia; Mrx spectacle  prescription;  CTL contact lenses; OD right eye; OS left eye; OU both eyes  XT exotropia; ET esotropia; PEK punctate epithelial keratitis; PEE punctate epithelial erosions; DES dry eye syndrome; MGD meibomian gland dysfunction; ATs artificial tears; PFAT's preservative free artificial tears; NSC nuclear sclerotic cataract; PSC posterior subcapsular cataract; ERM epi-retinal membrane; PVD posterior vitreous detachment; RD retinal detachment; DM diabetes mellitus; DR diabetic retinopathy; NPDR non-proliferative diabetic retinopathy; PDR proliferative diabetic retinopathy; CSME clinically significant macular edema; DME diabetic macular edema; dbh dot blot hemorrhages; CWS cotton wool spot; POAG primary open angle glaucoma; C/D cup-to-disc ratio; HVF humphrey visual field; GVF goldmann visual field; OCT optical coherence tomography; IOP intraocular pressure; BRVO Branch retinal vein occlusion; CRVO central retinal vein occlusion; CRAO central retinal artery occlusion; BRAO branch retinal artery occlusion; RT retinal tear; SB scleral buckle; PPV pars plana vitrectomy; VH Vitreous hemorrhage; PRP panretinal laser photocoagulation; IVK intravitreal kenalog ; VMT  vitreomacular traction; MH Macular hole;  NVD neovascularization of the disc; NVE neovascularization elsewhere; AREDS age related eye disease study; ARMD age related macular degeneration; POAG primary open angle glaucoma; EBMD epithelial/anterior basement membrane dystrophy; ACIOL anterior chamber intraocular lens; IOL intraocular lens; PCIOL posterior chamber intraocular lens; Phaco/IOL phacoemulsification with intraocular lens placement; PRK photorefractive keratectomy; LASIK laser assisted in situ keratomileusis; HTN hypertension; DM diabetes mellitus; COPD chronic obstructive pulmonary disease

## 2024-07-24 DIAGNOSIS — N1832 Chronic kidney disease, stage 3b: Secondary | ICD-10-CM | POA: Diagnosis not present

## 2024-07-26 ENCOUNTER — Encounter (INDEPENDENT_AMBULATORY_CARE_PROVIDER_SITE_OTHER): Payer: Self-pay

## 2024-07-26 ENCOUNTER — Encounter (INDEPENDENT_AMBULATORY_CARE_PROVIDER_SITE_OTHER): Admitting: Ophthalmology

## 2024-07-26 DIAGNOSIS — H35373 Puckering of macula, bilateral: Secondary | ICD-10-CM

## 2024-07-26 DIAGNOSIS — H40051 Ocular hypertension, right eye: Secondary | ICD-10-CM

## 2024-07-26 DIAGNOSIS — H35033 Hypertensive retinopathy, bilateral: Secondary | ICD-10-CM

## 2024-07-26 DIAGNOSIS — E119 Type 2 diabetes mellitus without complications: Secondary | ICD-10-CM

## 2024-07-26 DIAGNOSIS — I10 Essential (primary) hypertension: Secondary | ICD-10-CM

## 2024-07-26 DIAGNOSIS — H34831 Tributary (branch) retinal vein occlusion, right eye, with macular edema: Secondary | ICD-10-CM

## 2024-07-26 DIAGNOSIS — H40003 Preglaucoma, unspecified, bilateral: Secondary | ICD-10-CM

## 2024-07-26 DIAGNOSIS — Z961 Presence of intraocular lens: Secondary | ICD-10-CM

## 2024-07-31 DIAGNOSIS — E1122 Type 2 diabetes mellitus with diabetic chronic kidney disease: Secondary | ICD-10-CM | POA: Diagnosis not present

## 2024-07-31 DIAGNOSIS — E559 Vitamin D deficiency, unspecified: Secondary | ICD-10-CM | POA: Diagnosis not present

## 2024-07-31 DIAGNOSIS — N261 Atrophy of kidney (terminal): Secondary | ICD-10-CM | POA: Diagnosis not present

## 2024-07-31 DIAGNOSIS — R809 Proteinuria, unspecified: Secondary | ICD-10-CM | POA: Diagnosis not present

## 2024-07-31 DIAGNOSIS — N1832 Chronic kidney disease, stage 3b: Secondary | ICD-10-CM | POA: Diagnosis not present

## 2024-07-31 DIAGNOSIS — I129 Hypertensive chronic kidney disease with stage 1 through stage 4 chronic kidney disease, or unspecified chronic kidney disease: Secondary | ICD-10-CM | POA: Diagnosis not present

## 2024-07-31 NOTE — Progress Notes (Shared)
 [facility] - Clinic Note  08/02/2024    CHIEF COMPLAINT Patient presents for No chief complaint on file.  HISTORY OF PRESENT ILLNESS: Alejandra Davis is a 88 y.o. female who presents to the clinic today for:    Pt states   Referring physician: No referring provider defined for this encounter.  HISTORICAL INFORMATION:   Selected notes from the MEDICAL RECORD NUMBER Referred by Dr. MICAEL Lent for DM exam   CURRENT MEDICATIONS: No current outpatient medications on file. (Ophthalmic Drugs)   No current facility-administered medications for this visit. (Ophthalmic Drugs)   Current Outpatient Medications (Other)  Medication Sig   amLODipine  (NORVASC ) 10 MG tablet TAKE 1 TABLET(10 MG) BY MOUTH DAILY   ASPIRIN  LOW DOSE 81 MG EC tablet Take 81 mg by mouth daily.   ketoconazole (NIZORAL) 2 % shampoo Apply topically.   levothyroxine  (SYNTHROID ) 75 MCG tablet Take 1 tablet (75 mcg total) by mouth daily.   lisinopril  (ZESTRIL ) 2.5 MG tablet Take 1 tablet (2.5 mg total) by mouth daily.   memantine  (NAMENDA  XR) 7 MG CP24 24 hr capsule Take 1 capsule (7 mg total) by mouth daily.   metoprolol  tartrate (LOPRESSOR ) 50 MG tablet TAKE 1 TABLET(50 MG) BY MOUTH DAILY   No current facility-administered medications for this visit. (Other)   REVIEW OF SYSTEMS:   ALLERGIES Allergies  Allergen Reactions   Donepezil  Hcl     Stomach cramps    PAST MEDICAL HISTORY Past Medical History:  Diagnosis Date   Chicken pox    Cystocele    Diabetes mellitus without complication (HCC)    Glaucoma    Hypertension    Hypertensive retinopathy    OU   Neuromuscular disorder (HCC)    Dementia    Thyroid  disease    hypothyroidism   Past Surgical History:  Procedure Laterality Date   ABDOMINAL HYSTERECTOMY  1990   TAH BSO   CATARACT EXTRACTION Bilateral    Dr. Lelon   COLONOSCOPY WITH PROPOFOL  N/A 04/08/2021   Procedure: COLONOSCOPY WITH PROPOFOL ;  Surgeon: Dianna Specking,  MD;  Location: [facility];  Service: Endoscopy;  Laterality: N/A;   EYE SURGERY Bilateral    Cat Sx OU   OOPHORECTOMY     BSO   Vaginal Bx     Papilloma   FAMILY HISTORY Family History  Problem Relation Age of Onset   Hypertension Mother    Memory loss Mother    Cancer Father        LIVER   Heart disease Brother    Cancer Brother        STOMACH   Sickle cell anemia Other    SOCIAL HISTORY Social History   Tobacco Use   Smoking status: Never   Smokeless tobacco: Never  Vaping Use   Vaping status: Never Used  Substance Use Topics   Alcohol use: No   Drug use: No       OPHTHALMIC EXAM: Not recorded    IMAGING AND PROCEDURES  Imaging and Procedures for 03/07/18          ASSESSMENT/PLAN: No diagnosis found.  1. BRVO with CME OD - lost to follow up from 02.22.22 to 11.1.22 -- re-presented with massive CME and BCVA 20/400 from 20/30  - initial OCT w/ CME superior macula - s/p IVA OD #1 (10.18.19), #2 (11.19.19), #3 (12.17.19), #4 (02.19.20), #5 (03.23.20), #6 (04.21.20), #7 (05.27.20), #8 (07.07.20), #9 (08.25.20), #10 (9.29.20), #11 (11.17.20), #12 (12.22.20), #13 (01.27.21), #14 (  03.10.21), #15 (04.21.21), #16 (05.26.21), #17 (06.30.21), #18 (08.04.21), #19 (09.01.21), #20 (10.06.21), #21 (11.17.21), #22 (12.30.21), #23 (2.22.22), #24 (11.1.22), #25 (11.29.22), #26 (01.03.23), #27 (02.14.23), #28 (04.07.23), #29 (05.19.23), #30 (06.20.23), #31 (02.12.25), #32(03.25.25), #33 (05.06.25), #34 (06.18.25),  **history of worsening IRF/cystic changes at 6+ weeks interval, IVA** - s/p IVE OD #1 (08.11.23), #2 (09.08.23), #3 (10.06.23), #4 (11.06.23), #5 (12.13.23), #6 (01.24.24), #7 (03.27.24), #8 (05.08.24), #9 (06.19.24), #10 (07.31.24), #11 (09.11.24), #12 (10.30.24), #13 (12.18.24)  - BCVA OD 20/200 -- stable - OCT shows interval release of central VMT to partial PVD, stable improvement in central cystic changes, central SRHM and patchy ORA; diffuse retinal thinning  -- stable at 6 weeks  - recommend IVA OD #35 today, 08.06.25 w/ f/u at 6 wks again   - [employer] funding unavailable  - RBA of procedure discussed, questions answered - informed consent obtained and signed - see procedure note   - Avastin  informed consent form signed and scanned on 02.12.25  - f/u in 6 weeks -- DFE/OCT, possible injection   2. Diabetes mellitus, type 2 without retinopathy - The incidence, risk factors for progression, natural history and treatment options for diabetic retinopathy were discussed with patient.   - The need for close monitoring of blood glucose, blood pressure, and serum lipids, avoiding cigarette or any type of tobacco, and the need for long term follow up was also discussed with patient.    3. Epiretinal membrane, OU  - relatively mild ERM OU -- blunted central foveal depression - OD with mild cystic changes 2/2 BRVO as above; OS without cystic changes or edema  - discussed findings and prognosis   - continue to monitor  4. Pseudophakia OU  - s/p CE/IOL OS 11.13.19 w/ Dr. Lelon  - s/p CE/IOL OD 01.16.20 w/ Dr. Lelon  - beautiful surgeries -- IOLs in perfect position  - healing well post-operatively - IRF/CME OD may have been partly due to post op CME ([place]) in addition to BRVO  5,6. Ocular hypertension / Glaucoma suspect OD>OS  - IOP 11,13  - denies any family hx of Glaucoma   - under the expert management of Dr. Fleeta  - most recent notes reviewed (04.30.24 visit w/ Dr. Fleeta)  - on Cosopt  BID OU and Brimonidine  TID OU per Dr. Fleeta  7,8. Hypertensive retinopathy OU - pt presented to 6.24.2020 visit urgently for right sided headache above right eye with extension to occiput - BP at that time was 201/100 -- pt was sent to primary care clinic for urgent evaluation and meds were adjusted  - BP now under better control and headaches improved  - BP reading 11.1.22 -- 180/84 -- advised f/u with PCP  - discussed importance of tight BP  control  Ophthalmic Meds Ordered this visit:  No orders of the defined types were placed in this encounter.    No follow-ups on file.  There are no Patient Instructions on file for this visit.  This document serves as a record of services personally performed by Redell JUDITHANN Hans, MD, PhD. It was created on their behalf by Almetta Pesa, an ophthalmic technician. The creation of this record is the provider's dictation and/or activities during the visit.    Electronically signed by: Almetta Pesa, OA, 07/31/24  12:58 PM   Redell JUDITHANN Hans, M.D., Ph.D. Diseases & Surgery of the Retina and Vitreous [facility]   Abbreviations: M myopia (nearsighted); A astigmatism; H hyperopia (farsighted); P presbyopia; Mrx  spectacle prescription;  CTL contact lenses; OD right eye; OS left eye; OU both eyes  XT exotropia; ET esotropia; PEK punctate epithelial keratitis; PEE punctate epithelial erosions; DES dry eye syndrome; MGD meibomian gland dysfunction; ATs artificial tears; PFAT's preservative free artificial tears; NSC nuclear sclerotic cataract; PSC posterior subcapsular cataract; ERM epi-retinal membrane; PVD posterior vitreous detachment; RD retinal detachment; DM diabetes mellitus; DR diabetic retinopathy; NPDR non-proliferative diabetic retinopathy; PDR proliferative diabetic retinopathy; CSME clinically significant macular edema; DME diabetic macular edema; dbh dot blot hemorrhages; CWS cotton wool spot; POAG primary open angle glaucoma; C/D cup-to-disc ratio; HVF humphrey visual field; GVF goldmann visual field; OCT optical coherence tomography; IOP intraocular pressure; BRVO Branch retinal vein occlusion; CRVO central retinal vein occlusion; CRAO central retinal artery occlusion; BRAO branch retinal artery occlusion; RT retinal tear; SB scleral buckle; PPV pars plana vitrectomy; VH Vitreous hemorrhage; PRP panretinal laser photocoagulation; IVK intravitreal kenalog ; VMT  vitreomacular traction; MH Macular hole;  NVD neovascularization of the disc; NVE neovascularization elsewhere; AREDS age related eye disease study; ARMD age related macular degeneration; POAG primary open angle glaucoma; EBMD epithelial/anterior basement membrane dystrophy; ACIOL anterior chamber intraocular lens; IOL intraocular lens; PCIOL posterior chamber intraocular lens; Phaco/IOL phacoemulsification with intraocular lens placement; PRK photorefractive keratectomy; LASIK laser assisted in situ keratomileusis; HTN hypertension; DM diabetes mellitus; COPD chronic obstructive pulmonary disease

## 2024-08-02 ENCOUNTER — Encounter (INDEPENDENT_AMBULATORY_CARE_PROVIDER_SITE_OTHER): Payer: Self-pay

## 2024-08-02 ENCOUNTER — Encounter (INDEPENDENT_AMBULATORY_CARE_PROVIDER_SITE_OTHER): Admitting: Ophthalmology

## 2024-08-03 ENCOUNTER — Encounter (INDEPENDENT_AMBULATORY_CARE_PROVIDER_SITE_OTHER): Admitting: Ophthalmology

## 2024-08-03 NOTE — Progress Notes (Incomplete)
 [facility] - Clinic Note  08/04/2024    CHIEF COMPLAINT Patient presents for No chief complaint on file.  HISTORY OF PRESENT ILLNESS: Alejandra Davis is a 88 y.o. female who presents to the clinic today for:    Pt states vision is the same  Referring physician: No referring provider defined for this encounter.  HISTORICAL INFORMATION:   Selected notes from the MEDICAL RECORD NUMBER Referred by Dr. MICAEL Lent for DM exam   CURRENT MEDICATIONS: No current outpatient medications on file. (Ophthalmic Drugs)   No current facility-administered medications for this visit. (Ophthalmic Drugs)   Current Outpatient Medications (Other)  Medication Sig   amLODipine  (NORVASC ) 10 MG tablet TAKE 1 TABLET(10 MG) BY MOUTH DAILY   ASPIRIN  LOW DOSE 81 MG EC tablet Take 81 mg by mouth daily.   ketoconazole (NIZORAL) 2 % shampoo Apply topically.   levothyroxine  (SYNTHROID ) 75 MCG tablet Take 1 tablet (75 mcg total) by mouth daily.   lisinopril  (ZESTRIL ) 2.5 MG tablet Take 1 tablet (2.5 mg total) by mouth daily.   memantine  (NAMENDA  XR) 7 MG CP24 24 hr capsule Take 1 capsule (7 mg total) by mouth daily.   metoprolol  tartrate (LOPRESSOR ) 50 MG tablet TAKE 1 TABLET(50 MG) BY MOUTH DAILY   No current facility-administered medications for this visit. (Other)   REVIEW OF SYSTEMS:   ALLERGIES Allergies  Allergen Reactions   Donepezil  Hcl     Stomach cramps    PAST MEDICAL HISTORY Past Medical History:  Diagnosis Date   Chicken pox    Cystocele    Diabetes mellitus without complication (HCC)    Glaucoma    Hypertension    Hypertensive retinopathy    OU   Neuromuscular disorder (HCC)    Dementia    Thyroid  disease    hypothyroidism   Past Surgical History:  Procedure Laterality Date   ABDOMINAL HYSTERECTOMY  1990   TAH BSO   CATARACT EXTRACTION Bilateral    Dr. Lelon   COLONOSCOPY WITH PROPOFOL  N/A 04/08/2021   Procedure: COLONOSCOPY WITH PROPOFOL ;  Surgeon:  Dianna Specking, MD;  Location: [facility];  Service: Endoscopy;  Laterality: N/A;   EYE SURGERY Bilateral    Cat Sx OU   OOPHORECTOMY     BSO   Vaginal Bx     Papilloma   FAMILY HISTORY Family History  Problem Relation Age of Onset   Hypertension Mother    Memory loss Mother    Cancer Father        LIVER   Heart disease Brother    Cancer Brother        STOMACH   Sickle cell anemia Other    SOCIAL HISTORY Social History   Tobacco Use   Smoking status: Never   Smokeless tobacco: Never  Vaping Use   Vaping status: Never Used  Substance Use Topics   Alcohol use: No   Drug use: No       OPHTHALMIC EXAM: Not recorded    IMAGING AND PROCEDURES  Imaging and Procedures for 03/07/18          ASSESSMENT/PLAN: No diagnosis found.  1. BRVO with CME OD - lost to follow up from 02.22.22 to 11.1.22 -- re-presented with massive CME and BCVA 20/400 from 20/30  - initial OCT w/ CME superior macula - s/p IVA OD #1 (10.18.19), #2 (11.19.19), #3 (12.17.19), #4 (02.19.20), #5 (03.23.20), #6 (04.21.20), #7 (05.27.20), #8 (07.07.20), #9 (08.25.20), #10 (9.29.20), #11 (11.17.20), #12 (12.22.20), #  13 (01.27.21), #14 (03.10.21), #15 (04.21.21), #16 (05.26.21), #17 (06.30.21), #18 (08.04.21), #19 (09.01.21), #20 (10.06.21), #21 (11.17.21), #22 (12.30.21), #23 (2.22.22), #24 (11.1.22), #25 (11.29.22), #26 (01.03.23), #27 (02.14.23), #28 (04.07.23), #29 (05.19.23), #30 (06.20.23), #31 (02.12.25), #32(03.25.25), #33 (05.06.25) **history of worsening IRF/cystic changes at 6+ weeks interval, IVA** - s/p IVE OD #1 (08.11.23), #2 (09.08.23), #3 (10.06.23), #4 (11.06.23), #5 (12.13.23), #6 (01.24.24), #7 (03.27.24), #8 (05.08.24), #9 (06.19.24), #10 (07.31.24), #11 (09.11.24), #12 (10.30.24), #13 (12.18.24)  - BCVA OD 20/200 -- stable - OCT shows interval release of central VMT to partial PVD, stable improvement in central cystic changes, central SRHM and patchy ORA; diffuse retinal thinning  -- stable at 6 weeks  - recommend IVA OD #34 today, 06.18.25 w/ f/u at 6 wks again   - [employer] funding unavailable  - RBA of procedure discussed, questions answered - informed consent obtained and signed - see procedure note   - Avastin  informed consent form signed and scanned on 02.12.25  - f/u in 6 weeks -- DFE/OCT, possible injection   2. Diabetes mellitus, type 2 without retinopathy - The incidence, risk factors for progression, natural history and treatment options for diabetic retinopathy were discussed with patient.   - The need for close monitoring of blood glucose, blood pressure, and serum lipids, avoiding cigarette or any type of tobacco, and the need for long term follow up was also discussed with patient.    3. Epiretinal membrane, OU  - relatively mild ERM OU -- blunted central foveal depression - OD with mild cystic changes 2/2 BRVO as above; OS without cystic changes or edema  - discussed findings and prognosis   - continue to monitor  4. Pseudophakia OU  - s/p CE/IOL OS 11.13.19 w/ Dr. Lelon  - s/p CE/IOL OD 01.16.20 w/ Dr. Lelon  - beautiful surgeries -- IOLs in perfect position  - healing well post-operatively - IRF/CME OD may have been partly due to post op CME ([place]) in addition to BRVO  5,6. Ocular hypertension / Glaucoma suspect OD>OS  - IOP 11,13  - denies any family hx of Glaucoma   - under the expert management of Dr. Fleeta  - most recent notes reviewed (04.30.24 visit w/ Dr. Fleeta)  - on Cosopt  BID OU and Brimonidine  TID OU per Dr. Fleeta  7,8. Hypertensive retinopathy OU - pt presented to 6.24.2020 visit urgently for right sided headache above right eye with extension to occiput - BP at that time was 201/100 -- pt was sent to primary care clinic for urgent evaluation and meds were adjusted  - BP now under better control and headaches improved  - BP reading 11.1.22 -- 180/84 -- advised f/u with PCP  - discussed importance of tight BP  control  Ophthalmic Meds Ordered this visit:  No orders of the defined types were placed in this encounter.    No follow-ups on file.  There are no Patient Instructions on file for this visit.  This document serves as a record of services personally performed by Redell JUDITHANN Hans, MD, PhD. It was created on their behalf by Almetta Pesa, an ophthalmic technician. The creation of this record is the provider's dictation and/or activities during the visit.    Electronically signed by: Almetta Pesa, OA, 08/03/24  8:44 AM  This document serves as a record of services personally performed by Redell JUDITHANN Hans, MD, PhD. It was created on their behalf by Alan PARAS. Delores, OA an ophthalmic technician. The creation of this  record is the provider's dictation and/or activities during the visit.    Electronically signed by: Alan PARAS. Delores, OA 08/03/24 8:44 AM  Redell JUDITHANN Hans, M.D., Ph.D. Diseases & Surgery of the Retina and Vitreous [facility]  I have reviewed the above documentation for accuracy and completeness, and I agree with the above. Redell JUDITHANN Hans, M.D., Ph.D. 06/14/24 8:44 AM   Abbreviations: M myopia (nearsighted); A astigmatism; H hyperopia (farsighted); P presbyopia; Mrx spectacle prescription;  CTL contact lenses; OD right eye; OS left eye; OU both eyes  XT exotropia; ET esotropia; PEK punctate epithelial keratitis; PEE punctate epithelial erosions; DES dry eye syndrome; MGD meibomian gland dysfunction; ATs artificial tears; PFAT's preservative free artificial tears; NSC nuclear sclerotic cataract; PSC posterior subcapsular cataract; ERM epi-retinal membrane; PVD posterior vitreous detachment; RD retinal detachment; DM diabetes mellitus; DR diabetic retinopathy; NPDR non-proliferative diabetic retinopathy; PDR proliferative diabetic retinopathy; CSME clinically significant macular edema; DME diabetic macular edema; dbh dot blot hemorrhages; CWS cotton wool spot;  POAG primary open angle glaucoma; C/D cup-to-disc ratio; HVF humphrey visual field; GVF goldmann visual field; OCT optical coherence tomography; IOP intraocular pressure; BRVO Branch retinal vein occlusion; CRVO central retinal vein occlusion; CRAO central retinal artery occlusion; BRAO branch retinal artery occlusion; RT retinal tear; SB scleral buckle; PPV pars plana vitrectomy; VH Vitreous hemorrhage; PRP panretinal laser photocoagulation; IVK intravitreal kenalog ; VMT vitreomacular traction; MH Macular hole;  NVD neovascularization of the disc; NVE neovascularization elsewhere; AREDS age related eye disease study; ARMD age related macular degeneration; POAG primary open angle glaucoma; EBMD epithelial/anterior basement membrane dystrophy; ACIOL anterior chamber intraocular lens; IOL intraocular lens; PCIOL posterior chamber intraocular lens; Phaco/IOL phacoemulsification with intraocular lens placement; PRK photorefractive keratectomy; LASIK laser assisted in situ keratomileusis; HTN hypertension; DM diabetes mellitus; COPD chronic obstructive pulmonary disease

## 2024-08-04 ENCOUNTER — Encounter (INDEPENDENT_AMBULATORY_CARE_PROVIDER_SITE_OTHER): Payer: Self-pay | Admitting: Ophthalmology

## 2024-08-04 ENCOUNTER — Ambulatory Visit (INDEPENDENT_AMBULATORY_CARE_PROVIDER_SITE_OTHER): Admitting: Ophthalmology

## 2024-08-04 DIAGNOSIS — H40003 Preglaucoma, unspecified, bilateral: Secondary | ICD-10-CM

## 2024-08-04 DIAGNOSIS — H34831 Tributary (branch) retinal vein occlusion, right eye, with macular edema: Secondary | ICD-10-CM | POA: Diagnosis not present

## 2024-08-04 DIAGNOSIS — Z961 Presence of intraocular lens: Secondary | ICD-10-CM

## 2024-08-04 DIAGNOSIS — H40051 Ocular hypertension, right eye: Secondary | ICD-10-CM | POA: Diagnosis not present

## 2024-08-04 DIAGNOSIS — H35373 Puckering of macula, bilateral: Secondary | ICD-10-CM | POA: Diagnosis not present

## 2024-08-04 DIAGNOSIS — E119 Type 2 diabetes mellitus without complications: Secondary | ICD-10-CM | POA: Diagnosis not present

## 2024-08-04 DIAGNOSIS — I10 Essential (primary) hypertension: Secondary | ICD-10-CM

## 2024-08-04 DIAGNOSIS — H35033 Hypertensive retinopathy, bilateral: Secondary | ICD-10-CM

## 2024-08-04 MED ORDER — BEVACIZUMAB CHEMO INJECTION 1.25MG/0.05ML SYRINGE FOR KALEIDOSCOPE
1.2500 mg | INTRAVITREAL | Status: AC | PRN
Start: 2024-08-04 — End: 2024-08-04
  Administered 2024-08-04: 1.25 mg via INTRAVITREAL

## 2024-08-04 NOTE — Progress Notes (Signed)
 [facility] - Clinic Note  08/04/2024    CHIEF COMPLAINT Patient presents for Retina Follow Up  HISTORY OF PRESENT ILLNESS: Alejandra Davis is a 88 y.o. female who presents to the clinic today for:   HPI     Retina Follow Up   Patient presents with  Wet AMD.  In both eyes.  This started 6 years ago.  Severity is moderate.  Duration of 7 weeks.  I, the attending physician,  performed the HPI with the patient and updated documentation appropriately.        Comments   Pt states she does not think she has had any vision changes. Pt denies FOL/floaters/pain. Pt does not use any ats.      Last edited by Valdemar Rogue, MD on 08/09/2024  2:09 AM.     Pt states vision is the same  Referring physician: No referring provider defined for this encounter.  HISTORICAL INFORMATION:   Selected notes from the MEDICAL RECORD NUMBER Referred by Dr. MICAEL Lent for DM exam   CURRENT MEDICATIONS: No current outpatient medications on file. (Ophthalmic Drugs)   No current facility-administered medications for this visit. (Ophthalmic Drugs)   Current Outpatient Medications (Other)  Medication Sig   amLODipine  (NORVASC ) 10 MG tablet TAKE 1 TABLET(10 MG) BY MOUTH DAILY   ASPIRIN  LOW DOSE 81 MG EC tablet Take 81 mg by mouth daily.   ketoconazole (NIZORAL) 2 % shampoo Apply topically.   levothyroxine  (SYNTHROID ) 75 MCG tablet Take 1 tablet (75 mcg total) by mouth daily.   lisinopril  (ZESTRIL ) 2.5 MG tablet Take 1 tablet (2.5 mg total) by mouth daily.   memantine  (NAMENDA  XR) 7 MG CP24 24 hr capsule Take 1 capsule (7 mg total) by mouth daily.   metoprolol  tartrate (LOPRESSOR ) 50 MG tablet TAKE 1 TABLET(50 MG) BY MOUTH DAILY   No current facility-administered medications for this visit. (Other)   REVIEW OF SYSTEMS: ROS   Positive for: Skin, Genitourinary, Musculoskeletal, Endocrine, Cardiovascular, Eyes Negative for: Constitutional, Gastrointestinal, Neurological, HENT,  Respiratory, Psychiatric, Allergic/Imm, Heme/Lymph Last edited by Elnor Avelina RAMAN, COT on 08/04/2024  1:51 PM.      ALLERGIES Allergies  Allergen Reactions   Donepezil  Hcl     Stomach cramps    PAST MEDICAL HISTORY Past Medical History:  Diagnosis Date   Chicken pox    Cystocele    Diabetes mellitus without complication (HCC)    Glaucoma    Hypertension    Hypertensive retinopathy    OU   Neuromuscular disorder (HCC)    Dementia    Thyroid  disease    hypothyroidism   Past Surgical History:  Procedure Laterality Date   ABDOMINAL HYSTERECTOMY  1990   TAH BSO   CATARACT EXTRACTION Bilateral    Dr. Lelon   COLONOSCOPY WITH PROPOFOL  N/A 04/08/2021   Procedure: COLONOSCOPY WITH PROPOFOL ;  Surgeon: Dianna Specking, MD;  Location: [facility];  Service: Endoscopy;  Laterality: N/A;   EYE SURGERY Bilateral    Cat Sx OU   OOPHORECTOMY     BSO   Vaginal Bx     Papilloma   FAMILY HISTORY Family History  Problem Relation Age of Onset   Hypertension Mother    Memory loss Mother    Cancer Father        LIVER   Heart disease Brother    Cancer Brother        STOMACH   Sickle cell anemia Other    SOCIAL HISTORY  Social History   Tobacco Use   Smoking status: Never   Smokeless tobacco: Never  Vaping Use   Vaping status: Never Used  Substance Use Topics   Alcohol use: No   Drug use: No       OPHTHALMIC EXAM: Base Eye Exam     Visual Acuity (Snellen - Linear)       Right Left   Dist [place] 20/150 +2 20/80 +2   Dist ph [place] NI 20/30         Tonometry (Tonopen, 1:47 PM)       Right Left   Pressure 16 16         Pupils       Pupils Dark Light Shape React APD   Right PERRL 3 2 Round Brisk None   Left PERRL 3 2 Round Brisk m         Visual Fields       Left Right    Full Full         Extraocular Movement       Right Left    Full, Ortho Full, Ortho         Neuro/Psych     Oriented x3: Yes   Mood/Affect: Normal         Dilation      Both eyes: 1.0% Mydriacyl, 2.5% Phenylephrine @ 1:49 PM           Slit Lamp and Fundus Exam     Slit Lamp Exam       Right Left   Lids/Lashes Dermatochalasis - upper lid, Meibomian gland dysfunction Dermatochalasis - upper lid, Meibomian gland dysfunction   Conjunctiva/Sclera Melanosis Melanosis   Cornea Arcus, trace Punctate epithelial erosions, tear film debris, 1+ [place], Well healed temporal cataract wound Arcus, Inferior 1+ Punctate epithelial erosions, well healed cataract wound   Anterior Chamber Deep and quiet Deep;  no cell or flare   Iris Round and dilated, No NVI Round and dilated, No NVI   Lens PC IOL in good position, 2+ Posterior capsular opacification Posterior chamber intraocular lens in good position, 1-2+ PCO.   Anterior Vitreous Vitreous syneresis, vitreous condensations, silicone oil bubbles Vitreous syneresis, Vitreous condensations         Fundus Exam       Right Left   Disc Sharp rim, inferior notch, 3-4+ pallor, inf. Rim thinning, attenuated vessels superiorly, fine vascular loops superiorly 2+ pallor, Sharp rim, thin inferior rim, +cupping   C/D Ratio 0.85 0.75   Macula Blunted foveal reflex, persistent cystic changes superior mac -- stably improved; stable improvement in DBH superior mac, central exudates, fibrosis and atrophy -- stable, no frank heme Flat, good foveal reflex, mild RPE mottling and clumping, Epiretinal membrane, No heme or edema   Vessels superior BRVO with severe attenuation of ST arterioles, Tortuous attenuated, mild tortuosity   Periphery Attached, DBH superior hemisphere extended from BRVO -- stably resolved, No heme Attached, mild scattered Reticular degeneration, No heme           IMAGING AND PROCEDURES  Imaging and Procedures for 03/07/18  OCT, Retina - OU - Both Eyes       Right Eye Quality was good. Central Foveal Thickness: 202. Progression has been stable. Findings include no SRF, abnormal foveal contour,  subretinal hyper-reflective material, intraretinal hyper-reflective material, epiretinal membrane, intraretinal fluid, outer retinal atrophy (stable release of central VMT to partial PVD, stable improvement in central cystic changes, central SRHM and patchy ORA; diffuse retinal  thinning -- stable).   Left Eye Quality was good. Central Foveal Thickness: 267. Progression has been stable. Findings include no IRF, no SRF, abnormal foveal contour, epiretinal membrane, vitreomacular adhesion (Blunted foveal depression -- stable).   Notes *Images captured and stored on drive  Diagnosis / Impression:  ERM OU OD: BRVO w/ CME -- stable release of central VMT to partial PVD, stable improvement in central cystic changes, central SRHM and patchy ORA; diffuse retinal thinning -- stable OS: very mild ERM with blunted foveal depression -- stable; mild VMA  Clinical management:  See below  Abbreviations: NFP - Normal foveal profile. CME - cystoid macular edema. PED - pigment epithelial detachment. IRF - intraretinal fluid. SRF - subretinal fluid. EZ - ellipsoid zone. ERM - epiretinal membrane. ORA - outer retinal atrophy. ORT - outer retinal tubulation. SRHM - subretinal hyper-reflective material       Intravitreal Injection, Pharmacologic Agent - OD - Right Eye       Time Out 08/04/2024. 2:43 PM. Confirmed correct patient, procedure, site, and patient consented.   Anesthesia Topical anesthesia was used. Anesthetic medications included Lidocaine  2%, Tetracaine  0.5%.   Procedure Preparation included 5% betadine to ocular surface, eyelid speculum. A supplied needle was used.   Injection: 1.25 mg Bevacizumab  1.25mg /0.19ml   Route: Intravitreal, Site: Right Eye   NDC: [id number], Lot: [id number], Expiration date: 08/21/2024   Post-op Post injection exam found visual acuity of at least counting fingers. The patient tolerated the procedure well. There were no complications. The patient received written and  verbal post procedure care education.             ASSESSMENT/PLAN:   ICD-10-CM   1. Branch retinal vein occlusion of right eye with macular edema  H34.8310 OCT, Retina - OU - Both Eyes    Intravitreal Injection, Pharmacologic Agent - OD - Right Eye    Bevacizumab  (AVASTIN ) SOLN 1.25 mg    2. Diabetes mellitus type 2 without retinopathy (HCC)  E11.9     3. Epiretinal membrane (ERM) of both eyes  H35.373     4. Pseudophakia of both eyes  Z96.1     5. Ocular hypertension of right eye  H40.051     6. Glaucoma suspect of both eyes  H40.003     7. Essential hypertension  I10     8. Hypertensive retinopathy of both eyes  H35.033       1. BRVO with CME OD  - delayed f/u - 7 wks instead of 6 (06.18.25 to 08.08.25) - lost to follow up from 02.22.22 to 11.1.22 -- re-presented with massive CME and BCVA 20/400 from 20/30  - initial OCT w/ CME superior macula - s/p IVA OD #1 (10.18.19), #2 (11.19.19), #3 (12.17.19), #4 (02.19.20), #5 (03.23.20), #6 (04.21.20), #7 (05.27.20), #8 (07.07.20), #9 (08.25.20), #10 (9.29.20), #11 (11.17.20), #12 (12.22.20), #13 (01.27.21), #14 (03.10.21), #15 (04.21.21), #16 (05.26.21), #17 (06.30.21), #18 (08.04.21), #19 (09.01.21), #20 (10.06.21), #21 (11.17.21), #22 (12.30.21), #23 (2.22.22), #24 (11.1.22), #25 (11.29.22), #26 (01.03.23), #27 (02.14.23), #28 (04.07.23), #29 (05.19.23), #30 (06.20.23), #31 (02.12.25), #32(03.25.25), #33 (05.06.25), #34 (06.18.25) **history of worsening IRF/cystic changes at 6+ weeks interval, IVA** - s/p IVE OD #1 (08.11.23), #2 (09.08.23), #3 (10.06.23), #4 (11.06.23), #5 (12.13.23), #6 (01.24.24), #7 (03.27.24), #8 (05.08.24), #9 (06.19.24), #10 (07.31.24), #11 (09.11.24), #12 (10.30.24), #13 (12.18.24)  - BCVA OD 20/150--slight improvement from 20/200 - OCT shows stable release of central VMT to partial PVD, stable improvement in central cystic changes, central SRHM and  patchy ORA; diffuse retinal thinning -- stable at 7  weeks  - recommend IVA OD #35 today, 08.08.25 w/ f/u at 7 wks again   - [employer] funding unavailable  - RBA of procedure discussed, questions answered - informed consent obtained and signed - see procedure note   - Avastin  informed consent form signed and scanned on 02.12.25  - f/u in 7 weeks -- DFE/OCT, possible injection   2. Diabetes mellitus, type 2 without retinopathy - The incidence, risk factors for progression, natural history and treatment options for diabetic retinopathy were discussed with patient.   - The need for close monitoring of blood glucose, blood pressure, and serum lipids, avoiding cigarette or any type of tobacco, and the need for long term follow up was also discussed with patient.    3. Epiretinal membrane, OU  - relatively mild ERM OU -- blunted central foveal depression - OD with mild cystic changes 2/2 BRVO as above; OS without cystic changes or edema  - discussed findings and prognosis   - continue to monitor  4. Pseudophakia OU  - s/p CE/IOL OS 11.13.19 w/ Dr. Lelon  - s/p CE/IOL OD 01.16.20 w/ Dr. Lelon  - beautiful surgeries -- IOLs in perfect position  - healing well post-operatively - IRF/CME OD may have been partly due to post op CME ([place]) in addition to BRVO  5,6. Ocular hypertension / Glaucoma suspect OD>OS  - IOP 11,13  - denies any family hx of Glaucoma   - under the expert management of Dr. Fleeta  - most recent notes reviewed (04.30.24 visit w/ Dr. Fleeta)  - on Cosopt  BID OU and Brimonidine  TID OU per Dr. Fleeta  7,8. Hypertensive retinopathy OU - pt presented to 6.24.2020 visit urgently for right sided headache above right eye with extension to occiput - BP at that time was 201/100 -- pt was sent to primary care clinic for urgent evaluation and meds were adjusted  - BP now under better control and headaches improved  - BP reading 11.1.22 -- 180/84 -- advised f/u with PCP  - discussed importance of tight BP control  Ophthalmic Meds  Ordered this visit:  Meds ordered this encounter  Medications   Bevacizumab  (AVASTIN ) SOLN 1.25 mg     Return in 7 weeks (on 09/22/2024) for BRVO OD, Dilated Exam, OCT, Possible Injxn.  There are no Patient Instructions on file for this visit.  This document serves as a record of services personally performed by Redell JUDITHANN Hans, MD, PhD. It was created on their behalf by Almetta Pesa, an ophthalmic technician. The creation of this record is the provider's dictation and/or activities during the visit.    Electronically signed by: Almetta Pesa, OA, 08/09/24  2:13 AM  This document serves as a record of services personally performed by Redell JUDITHANN Hans, MD, PhD. It was created on their behalf by Auston Muzzy, COMT. The creation of this record is the provider's dictation and/or activities during the visit.  Electronically signed by: Auston Muzzy, COMT 08/09/24 2:13 AM  Redell JUDITHANN Hans, M.D., Ph.D. Diseases & Surgery of the Retina and Vitreous [facility]  I have reviewed the above documentation for accuracy and completeness, and I agree with the above. Redell JUDITHANN Hans, M.D., Ph.D. 08/09/24 2:13 AM   Abbreviations: M myopia (nearsighted); A astigmatism; H hyperopia (farsighted); P presbyopia; Mrx spectacle prescription;  CTL contact lenses; OD right eye; OS left eye; OU both eyes  XT exotropia; ET esotropia;  PEK punctate epithelial keratitis; PEE punctate epithelial erosions; DES dry eye syndrome; MGD meibomian gland dysfunction; ATs artificial tears; PFAT's preservative free artificial tears; NSC nuclear sclerotic cataract; PSC posterior subcapsular cataract; ERM epi-retinal membrane; PVD posterior vitreous detachment; RD retinal detachment; DM diabetes mellitus; DR diabetic retinopathy; NPDR non-proliferative diabetic retinopathy; PDR proliferative diabetic retinopathy; CSME clinically significant macular edema; DME diabetic macular edema; dbh dot blot hemorrhages;  CWS cotton wool spot; POAG primary open angle glaucoma; C/D cup-to-disc ratio; HVF humphrey visual field; GVF goldmann visual field; OCT optical coherence tomography; IOP intraocular pressure; BRVO Branch retinal vein occlusion; CRVO central retinal vein occlusion; CRAO central retinal artery occlusion; BRAO branch retinal artery occlusion; RT retinal tear; SB scleral buckle; PPV pars plana vitrectomy; VH Vitreous hemorrhage; PRP panretinal laser photocoagulation; IVK intravitreal kenalog ; VMT vitreomacular traction; MH Macular hole;  NVD neovascularization of the disc; NVE neovascularization elsewhere; AREDS age related eye disease study; ARMD age related macular degeneration; POAG primary open angle glaucoma; EBMD epithelial/anterior basement membrane dystrophy; ACIOL anterior chamber intraocular lens; IOL intraocular lens; PCIOL posterior chamber intraocular lens; Phaco/IOL phacoemulsification with intraocular lens placement; PRK photorefractive keratectomy; LASIK laser assisted in situ keratomileusis; HTN hypertension; DM diabetes mellitus; COPD chronic obstructive pulmonary disease

## 2024-08-31 ENCOUNTER — Telehealth: Payer: Self-pay | Admitting: Podiatry

## 2024-08-31 NOTE — Telephone Encounter (Signed)
 Called and left message on daughters phone to reschedule appointment.

## 2024-09-22 ENCOUNTER — Ambulatory Visit: Admitting: Podiatry

## 2024-09-23 ENCOUNTER — Encounter: Payer: Self-pay | Admitting: Family Medicine

## 2024-09-25 ENCOUNTER — Telehealth: Payer: Self-pay | Admitting: Family Medicine

## 2024-09-25 ENCOUNTER — Encounter: Payer: Self-pay | Admitting: Family Medicine

## 2024-09-25 NOTE — Telephone Encounter (Signed)
 error

## 2024-09-26 ENCOUNTER — Encounter: Payer: Self-pay | Admitting: Podiatry

## 2024-09-26 ENCOUNTER — Ambulatory Visit (INDEPENDENT_AMBULATORY_CARE_PROVIDER_SITE_OTHER): Admitting: Podiatry

## 2024-09-26 VITALS — Ht 62.0 in | Wt 123.0 lb

## 2024-09-26 DIAGNOSIS — B351 Tinea unguium: Secondary | ICD-10-CM | POA: Diagnosis not present

## 2024-09-26 DIAGNOSIS — M79671 Pain in right foot: Secondary | ICD-10-CM | POA: Diagnosis not present

## 2024-09-26 DIAGNOSIS — M79672 Pain in left foot: Secondary | ICD-10-CM | POA: Diagnosis not present

## 2024-09-26 NOTE — Progress Notes (Signed)
 Patient presents for evaluation and treatment of tenderness and some redness around nails feet.  Tenderness around toes with walking and wearing shoes.  Physical exam:  General appearance: Alert, pleasant, and in no acute distress.  Vascular: Pedal pulses: DP 2/4 B/L, PT 1/4 B/L. Mild edema lower legs bilaterally  Neu  Dermatologic:  Nails thickened, disfigured, discolored 1-5 BL with subungual debris.  Redness and hypertrophic nail folds along nail folds bilaterally but no signs of drainage or infection.  Musculoskeletal:     Diagnosis: 1. Painful onychomycotic nails 1 through 5 bilaterally. 2. Pain toes 1 through 5 bilaterally.  Plan: -Debrided onychomycotic nails 1 through 5 bilaterally.  Sharply debrided nails with nail clipper and reduced with a power bur.  Return 3 months [facility]

## 2024-09-27 NOTE — Progress Notes (Signed)
 [facility] - Clinic Note  09/29/2024    CHIEF COMPLAINT Patient presents for Retina Follow Up  HISTORY OF PRESENT ILLNESS: Alejandra Davis is a 88 y.o. female who presents to the clinic today for:   HPI     Retina Follow Up   Patient presents with  CRVO/BRVO.  In right eye.  This started 8 weeks ago.  I, the attending physician,  performed the HPI with the patient and updated documentation appropriately.        Comments   Patient here for 8 weeks retina follow up for BRVO OD/DFE. Patient states vision doing ok. Sees pretty well. No eye pain.       Last edited by Valdemar Rogue, MD on 09/29/2024  2:41 PM.    Pt states vision is the same, stable   Referring physician: No referring provider defined for this encounter.  HISTORICAL INFORMATION:   Selected notes from the MEDICAL RECORD NUMBER Referred by Dr. MICAEL Lent for DM exam   CURRENT MEDICATIONS: No current outpatient medications on file. (Ophthalmic Drugs)   No current facility-administered medications for this visit. (Ophthalmic Drugs)   Current Outpatient Medications (Other)  Medication Sig   amLODipine  (NORVASC ) 10 MG tablet TAKE 1 TABLET(10 MG) BY MOUTH DAILY   ASPIRIN  LOW DOSE 81 MG EC tablet Take 81 mg by mouth daily.   ketoconazole (NIZORAL) 2 % shampoo Apply topically.   levothyroxine  (SYNTHROID ) 75 MCG tablet Take 1 tablet (75 mcg total) by mouth daily.   lisinopril  (ZESTRIL ) 2.5 MG tablet Take 1 tablet (2.5 mg total) by mouth daily.   memantine  (NAMENDA  XR) 7 MG CP24 24 hr capsule Take 1 capsule (7 mg total) by mouth daily.   metoprolol  tartrate (LOPRESSOR ) 50 MG tablet TAKE 1 TABLET(50 MG) BY MOUTH DAILY   No current facility-administered medications for this visit. (Other)   REVIEW OF SYSTEMS: ROS   Positive for: Skin, Genitourinary, Musculoskeletal, Endocrine, Cardiovascular, Eyes Negative for: Constitutional, Gastrointestinal, Neurological, HENT, Respiratory, Psychiatric,  Allergic/Imm, Heme/Lymph Last edited by Orval Asberry RAMAN, COA on 09/29/2024  1:36 PM.       ALLERGIES Allergies  Allergen Reactions   Donepezil  Hcl     Stomach cramps    PAST MEDICAL HISTORY Past Medical History:  Diagnosis Date   Chicken pox    Cystocele    Diabetes mellitus without complication (HCC)    Glaucoma    Hypertension    Hypertensive retinopathy    OU   Neuromuscular disorder (HCC)    Dementia    Thyroid  disease    hypothyroidism   Past Surgical History:  Procedure Laterality Date   ABDOMINAL HYSTERECTOMY  1990   TAH BSO   CATARACT EXTRACTION Bilateral    Dr. Lelon   COLONOSCOPY WITH PROPOFOL  N/A 04/08/2021   Procedure: COLONOSCOPY WITH PROPOFOL ;  Surgeon: Dianna Specking, MD;  Location: [facility];  Service: Endoscopy;  Laterality: N/A;   EYE SURGERY Bilateral    Cat Sx OU   OOPHORECTOMY     BSO   Vaginal Bx     Papilloma   FAMILY HISTORY Family History  Problem Relation Age of Onset   Hypertension Mother    Memory loss Mother    Cancer Father        LIVER   Heart disease Brother    Cancer Brother        STOMACH   Sickle cell anemia Other    SOCIAL HISTORY Social History   Tobacco Use  Smoking status: Never   Smokeless tobacco: Never  Vaping Use   Vaping status: Never Used  Substance Use Topics   Alcohol use: No   Drug use: No       OPHTHALMIC EXAM: Base Eye Exam     Visual Acuity (Snellen - Linear)       Right Left   Dist cc 20/150 -1 20/70 +2   Dist ph cc NI 20/40 +1    Correction: Glasses         Tonometry (Tonopen, 1:34 PM)       Right Left   Pressure 15 14         Pupils       Dark Light Shape React APD   Right 3 2 Round Brisk None   Left 3 2 Round Brisk None         Visual Fields (Counting fingers)       Left Right    Full Full         Extraocular Movement       Right Left    Full, Ortho Full, Ortho         Neuro/Psych     Oriented x3: Yes   Mood/Affect: Normal          Dilation     Both eyes: 1.0% Mydriacyl, 2.5% Phenylephrine @ 1:34 PM           Slit Lamp and Fundus Exam     Slit Lamp Exam       Right Left   Lids/Lashes Dermatochalasis - upper lid, Meibomian gland dysfunction Dermatochalasis - upper lid, Meibomian gland dysfunction   Conjunctiva/Sclera Melanosis Melanosis   Cornea Arcus, trace Punctate epithelial erosions, tear film debris, 1+ [place], Well healed temporal cataract wound Arcus, Inferior 1+ Punctate epithelial erosions, well healed cataract wound   Anterior Chamber Deep and quiet Deep;  no cell or flare   Iris Round and dilated, No NVI Round and dilated, No NVI   Lens PC IOL in good position, 2+ Posterior capsular opacification Posterior chamber intraocular lens in good position, 1-2+ PCO.   Anterior Vitreous Vitreous syneresis, vitreous condensations, silicone oil bubbles Vitreous syneresis, Vitreous condensations         Fundus Exam       Right Left   Disc Sharp rim, inferior notch, 3-4+ pallor, inf. Rim thinning, attenuated vessels superiorly, fine vascular loops superiorly 2+ pallor, Sharp rim, thin inferior rim, +cupping   C/D Ratio 0.85 0.75   Macula Blunted foveal reflex, persistent cystic changes superior mac -- stably improved; stable improvement in DBH superior mac, central exudates, fibrosis and atrophy -- stable, no frank heme Flat, good foveal reflex, mild RPE mottling and clumping, Epiretinal membrane, No heme or edema   Vessels superior BRVO with severe attenuation of ST arterioles, Tortuous attenuated, mild tortuosity   Periphery Attached, DBH superior hemisphere extended from BRVO -- stably resolved, No heme Attached, mild scattered Reticular degeneration, No heme           Refraction     Wearing Rx       Sphere Cylinder Axis Add   Right -0.75 +1.25 170 +2.50   Left -0.75 +1.00 003 +2.50    Type: PAL         Wearing Rx #2       Sphere Cylinder Axis Add   Right -0.75 +1.25 170 +2.50   Left  -0.75 +1.00 003 +2.50    Type: PAL  IMAGING AND PROCEDURES  Imaging and Procedures for 03/07/18  OCT, Retina - OU - Both Eyes       Right Eye Quality was good. Central Foveal Thickness: 204. Progression has been stable. Findings include no SRF, abnormal foveal contour, subretinal hyper-reflective material, intraretinal hyper-reflective material, epiretinal membrane, intraretinal fluid, outer retinal atrophy (stable release of central VMT to partial PVD, trace central cystic changes, central SRHM and patchy ORA; diffuse retinal thinning -- stable).   Left Eye Quality was good. Central Foveal Thickness: 254. Progression has been stable. Findings include no IRF, no SRF, abnormal foveal contour, epiretinal membrane, vitreomacular adhesion (Blunted foveal depression -- stable).   Notes *Images captured and stored on drive  Diagnosis / Impression:  ERM OU OD: BRVO w/ CME -- stable release of central VMT to partial PVD, trace central cystic changes, central SRHM and patchy ORA; diffuse retinal thinning -- stable OS: very mild ERM with blunted foveal depression -- stable; mild VMA  Clinical management:  See below  Abbreviations: NFP - Normal foveal profile. CME - cystoid macular edema. PED - pigment epithelial detachment. IRF - intraretinal fluid. SRF - subretinal fluid. EZ - ellipsoid zone. ERM - epiretinal membrane. ORA - outer retinal atrophy. ORT - outer retinal tubulation. SRHM - subretinal hyper-reflective material       Intravitreal Injection, Pharmacologic Agent - OD - Right Eye       Time Out 09/29/2024. 1:37 PM. Confirmed correct patient, procedure, site, and patient consented.   Anesthesia Topical anesthesia was used. Anesthetic medications included Lidocaine  2%, Tetracaine  0.5%.   Procedure Preparation included 5% betadine to ocular surface, eyelid speculum. A supplied needle was used.   Injection: 1.25 mg Bevacizumab  1.25mg /0.75ml   Route:  Intravitreal, Site: Right Eye   NDC: [id number], Lot: [id number], Expiration date: 10/15/2024   Post-op Post injection exam found visual acuity of at least counting fingers. The patient tolerated the procedure well. There were no complications. The patient received written and verbal post procedure care education.              ASSESSMENT/PLAN:   ICD-10-CM   1. Branch retinal vein occlusion of right eye with macular edema (HCC)  H34.8310 OCT, Retina - OU - Both Eyes    Intravitreal Injection, Pharmacologic Agent - OD - Right Eye    Bevacizumab  (AVASTIN ) SOLN 1.25 mg    2. Diabetes mellitus type 2 without retinopathy (HCC)  E11.9     3. Epiretinal membrane (ERM) of both eyes  H35.373     4. Pseudophakia of both eyes  Z96.1     5. Ocular hypertension of right eye  H40.051     6. Glaucoma suspect of both eyes  H40.003     7. Essential hypertension  I10     8. Hypertensive retinopathy of both eyes  H35.033      1. BRVO with CME OD  - delayed f/u - 7 wks instead of 6 (06.18.25 to 08.08.25) - lost to follow up from 02.22.22 to 11.1.22 -- re-presented with massive CME and BCVA 20/400 from 20/30  - initial OCT w/ CME superior macula - s/p IVA OD #1 (10.18.19), #2 (11.19.19), #3 (12.17.19), #4 (02.19.20), #5 (03.23.20), #6 (04.21.20), #7 (05.27.20), #8 (07.07.20), #9 (08.25.20), #10 (9.29.20), #11 (11.17.20), #12 (12.22.20), #13 (01.27.21), #14 (03.10.21), #15 (04.21.21), #16 (05.26.21), #17 (06.30.21), #18 (08.04.21), #19 (09.01.21), #20 (10.06.21), #21 (11.17.21), #22 (12.30.21), #23 (2.22.22), #24 (11.1.22), #25 (11.29.22), #26 (01.03.23), #27 (02.14.23), #28 (04.07.23), #29 (05.19.23), #30 (06.20.23), #31 (  02.12.25), #32(03.25.25), #33 (05.06.25), #34 (06.18.25) #35(08.08.2025) **history of worsening IRF/cystic changes at 6+ weeks interval, IVA** - s/p IVE OD #1 (08.11.23), #2 (09.08.23), #3 (10.06.23), #4 (11.06.23), #5 (12.13.23), #6 (01.24.24), #7 (03.27.24), #8 (05.08.24), #9  (06.19.24), #10 (07.31.24), #11 (09.11.24), #12 (10.30.24), #13 (12.18.24)  - BCVA OD 20/150--stable - OCT shows stable release of central VMT to partial PVD, trace central cystic changes, central SRHM and patchy ORA; diffuse retinal thinning -- stable at 8 weeks  - recommend IVA OD #36 today, 10.03.25 w/ f/u at 7-8 wks again   - [employer] funding unavailable  - RBA of procedure discussed, questions answered - informed consent obtained and signed - see procedure note   - Avastin  informed consent form signed and scanned on 02.12.25  - f/u in 7-8 weeks -- DFE/OCT, possible injection   2. Diabetes mellitus, type 2 without retinopathy - The incidence, risk factors for progression, natural history and treatment options for diabetic retinopathy were discussed with patient.   - The need for close monitoring of blood glucose, blood pressure, and serum lipids, avoiding cigarette or any type of tobacco, and the need for long term follow up was also discussed with patient.    3. Epiretinal membrane, OU  - relatively mild ERM OU -- blunted central foveal depression - OD with mild cystic changes 2/2 BRVO as above; OS without cystic changes or edema  - discussed findings and prognosis   - continue to monitor  4. Pseudophakia OU  - s/p CE/IOL OS 11.13.19 w/ Dr. Lelon  - s/p CE/IOL OD 01.16.20 w/ Dr. Lelon  - beautiful surgeries -- IOLs in perfect position  - healing well post-operatively - IRF/CME OD may have been partly due to post op CME ([place]) in addition to BRVO  5,6. Ocular hypertension / Glaucoma suspect OD>OS  - IOP 11,13  - denies any family hx of Glaucoma   - under the expert management of Dr. Fleeta  - most recent notes reviewed (04.30.24 visit w/ Dr. Fleeta)  - on Cosopt  BID OU and Brimonidine  TID OU per Dr. Fleeta  7,8. Hypertensive retinopathy OU - pt presented to 6.24.2020 visit urgently for right sided headache above right eye with extension to occiput - BP at that time was  201/100 -- pt was sent to primary care clinic for urgent evaluation and meds were adjusted  - BP now under better control and headaches improved  - BP reading 11.1.22 -- 180/84 -- advised f/u with PCP  - discussed importance of tight BP control  Ophthalmic Meds Ordered this visit:  Meds ordered this encounter  Medications   Bevacizumab  (AVASTIN ) SOLN 1.25 mg     Return for 7-8wks BRVO OD, DFE, OCT, Possible Injxn.  There are no Patient Instructions on file for this visit.  This document serves as a record of services personally performed by Redell JUDITHANN Hans, MD, PhD. It was created on their behalf by Delon Newness COT, an ophthalmic technician. The creation of this record is the provider's dictation and/or activities during the visit.    Electronically signed by: Delon Newness COT 10.01.2025  3:24 PM  This document serves as a record of services personally performed by Redell JUDITHANN Hans, MD, PhD. It was created on their behalf by Almetta Pesa, an ophthalmic technician. The creation of this record is the provider's dictation and/or activities during the visit.    Electronically signed by: Almetta Pesa, OA, 10/01/24  3:24 PM  Redell JUDITHANN Hans, M.D., Ph.D. Diseases &  Surgery of the Retina and Vitreous [facility] 09/29/2024   I have reviewed the above documentation for accuracy and completeness, and I agree with the above. Redell JUDITHANN Hans, M.D., Ph.D. 10/01/24 3:33 PM   Abbreviations: M myopia (nearsighted); A astigmatism; H hyperopia (farsighted); P presbyopia; Mrx spectacle prescription;  CTL contact lenses; OD right eye; OS left eye; OU both eyes  XT exotropia; ET esotropia; PEK punctate epithelial keratitis; PEE punctate epithelial erosions; DES dry eye syndrome; MGD meibomian gland dysfunction; ATs artificial tears; PFAT's preservative free artificial tears; NSC nuclear sclerotic cataract; PSC posterior subcapsular cataract; ERM epi-retinal  membrane; PVD posterior vitreous detachment; RD retinal detachment; DM diabetes mellitus; DR diabetic retinopathy; NPDR non-proliferative diabetic retinopathy; PDR proliferative diabetic retinopathy; CSME clinically significant macular edema; DME diabetic macular edema; dbh dot blot hemorrhages; CWS cotton wool spot; POAG primary open angle glaucoma; C/D cup-to-disc ratio; HVF humphrey visual field; GVF goldmann visual field; OCT optical coherence tomography; IOP intraocular pressure; BRVO Branch retinal vein occlusion; CRVO central retinal vein occlusion; CRAO central retinal artery occlusion; BRAO branch retinal artery occlusion; RT retinal tear; SB scleral buckle; PPV pars plana vitrectomy; VH Vitreous hemorrhage; PRP panretinal laser photocoagulation; IVK intravitreal kenalog ; VMT vitreomacular traction; MH Macular hole;  NVD neovascularization of the disc; NVE neovascularization elsewhere; AREDS age related eye disease study; ARMD age related macular degeneration; POAG primary open angle glaucoma; EBMD epithelial/anterior basement membrane dystrophy; ACIOL anterior chamber intraocular lens; IOL intraocular lens; PCIOL posterior chamber intraocular lens; Phaco/IOL phacoemulsification with intraocular lens placement; PRK photorefractive keratectomy; LASIK laser assisted in situ keratomileusis; HTN hypertension; DM diabetes mellitus; COPD chronic obstructive pulmonary disease

## 2024-09-29 ENCOUNTER — Encounter (INDEPENDENT_AMBULATORY_CARE_PROVIDER_SITE_OTHER): Payer: Self-pay | Admitting: Ophthalmology

## 2024-09-29 ENCOUNTER — Ambulatory Visit (INDEPENDENT_AMBULATORY_CARE_PROVIDER_SITE_OTHER): Admitting: Ophthalmology

## 2024-09-29 DIAGNOSIS — H35033 Hypertensive retinopathy, bilateral: Secondary | ICD-10-CM

## 2024-09-29 DIAGNOSIS — H40051 Ocular hypertension, right eye: Secondary | ICD-10-CM

## 2024-09-29 DIAGNOSIS — E119 Type 2 diabetes mellitus without complications: Secondary | ICD-10-CM

## 2024-09-29 DIAGNOSIS — Z961 Presence of intraocular lens: Secondary | ICD-10-CM

## 2024-09-29 DIAGNOSIS — H40003 Preglaucoma, unspecified, bilateral: Secondary | ICD-10-CM | POA: Diagnosis not present

## 2024-09-29 DIAGNOSIS — H34831 Tributary (branch) retinal vein occlusion, right eye, with macular edema: Secondary | ICD-10-CM

## 2024-09-29 DIAGNOSIS — H35373 Puckering of macula, bilateral: Secondary | ICD-10-CM

## 2024-09-29 DIAGNOSIS — I10 Essential (primary) hypertension: Secondary | ICD-10-CM

## 2024-09-29 MED ORDER — BEVACIZUMAB CHEMO INJECTION 1.25MG/0.05ML SYRINGE FOR KALEIDOSCOPE
1.2500 mg | INTRAVITREAL | Status: AC | PRN
  Administered 2024-09-29: 1.25 mg via INTRAVITREAL

## 2024-10-09 DIAGNOSIS — F0390 Unspecified dementia without behavioral disturbance: Secondary | ICD-10-CM | POA: Diagnosis not present

## 2024-10-09 DIAGNOSIS — H35033 Hypertensive retinopathy, bilateral: Secondary | ICD-10-CM | POA: Diagnosis not present

## 2024-10-09 DIAGNOSIS — Z23 Encounter for immunization: Secondary | ICD-10-CM | POA: Diagnosis not present

## 2024-10-09 DIAGNOSIS — H401133 Primary open-angle glaucoma, bilateral, severe stage: Secondary | ICD-10-CM | POA: Diagnosis not present

## 2024-10-09 DIAGNOSIS — E1149 Type 2 diabetes mellitus with other diabetic neurological complication: Secondary | ICD-10-CM | POA: Diagnosis not present

## 2024-10-25 DIAGNOSIS — M85851 Other specified disorders of bone density and structure, right thigh: Secondary | ICD-10-CM | POA: Diagnosis not present

## 2024-10-25 DIAGNOSIS — M85852 Other specified disorders of bone density and structure, left thigh: Secondary | ICD-10-CM | POA: Diagnosis not present

## 2024-11-14 NOTE — Progress Notes (Signed)
 [facility] - Clinic Note  11/17/2024    CHIEF COMPLAINT Patient presents for Retina Follow Up  HISTORY OF PRESENT ILLNESS: Alejandra Davis is a 88 y.o. female who presents to the clinic today for:   HPI     Retina Follow Up   Patient presents with  CRVO/BRVO.  In right eye.  This started 6 years ago.  Duration of 7 weeks.  I, the attending physician,  performed the HPI with the patient and updated documentation appropriately.        Comments   Pt denies any changes in vision, she  hasn't been wearing her glasses because they do not seem to  help much. Pt denies FOL/floaters/pain. Pt is not consistent with IOP drops and does not allow her daughter to put them in every day.      Last edited by Valdemar Rogue, MD on 11/17/2024  4:40 PM.      Referring physician: No referring provider defined for this encounter.  HISTORICAL INFORMATION:   Selected notes from the MEDICAL RECORD NUMBER Referred by Dr. MICAEL Lent for DM exam   CURRENT MEDICATIONS: No current outpatient medications on file. (Ophthalmic Drugs)   No current facility-administered medications for this visit. (Ophthalmic Drugs)   Current Outpatient Medications (Other)  Medication Sig   amLODipine  (NORVASC ) 10 MG tablet TAKE 1 TABLET(10 MG) BY MOUTH DAILY   ASPIRIN  LOW DOSE 81 MG EC tablet Take 81 mg by mouth daily.   ketoconazole (NIZORAL) 2 % shampoo Apply topically.   levothyroxine  (SYNTHROID ) 75 MCG tablet Take 1 tablet (75 mcg total) by mouth daily.   lisinopril  (ZESTRIL ) 2.5 MG tablet Take 1 tablet (2.5 mg total) by mouth daily.   memantine  (NAMENDA  XR) 7 MG CP24 24 hr capsule Take 1 capsule (7 mg total) by mouth daily.   metoprolol  tartrate (LOPRESSOR ) 50 MG tablet TAKE 1 TABLET(50 MG) BY MOUTH DAILY   No current facility-administered medications for this visit. (Other)   REVIEW OF SYSTEMS: ROS   Positive for: Skin, Genitourinary, Musculoskeletal, Endocrine, Cardiovascular,  Eyes Negative for: Constitutional, Gastrointestinal, Neurological, HENT, Respiratory, Psychiatric, Allergic/Imm, Heme/Lymph Last edited by Elnor Avelina RAMAN, COT on 11/17/2024  1:39 PM.     ALLERGIES Allergies  Allergen Reactions   Donepezil  Hcl     Stomach cramps    PAST MEDICAL HISTORY Past Medical History:  Diagnosis Date   Chicken pox    Cystocele    Diabetes mellitus without complication (HCC)    Glaucoma    Hypertension    Hypertensive retinopathy    OU   Neuromuscular disorder (HCC)    Dementia    Thyroid  disease    hypothyroidism   Past Surgical History:  Procedure Laterality Date   ABDOMINAL HYSTERECTOMY  1990   TAH BSO   CATARACT EXTRACTION Bilateral    Dr. Lelon   COLONOSCOPY WITH PROPOFOL  N/A 04/08/2021   Procedure: COLONOSCOPY WITH PROPOFOL ;  Surgeon: Dianna Specking, MD;  Location: [facility];  Service: Endoscopy;  Laterality: N/A;   EYE SURGERY Bilateral    Cat Sx OU   OOPHORECTOMY     BSO   Vaginal Bx     Papilloma   FAMILY HISTORY Family History  Problem Relation Age of Onset   Hypertension Mother    Memory loss Mother    Cancer Father        LIVER   Heart disease Brother    Cancer Brother        STOMACH  Sickle cell anemia Other    SOCIAL HISTORY Social History   Tobacco Use   Smoking status: Never   Smokeless tobacco: Never  Vaping Use   Vaping status: Never Used  Substance Use Topics   Alcohol use: No   Drug use: No       OPHTHALMIC EXAM: Base Eye Exam     Visual Acuity (Snellen - Linear)       Right Left   Dist [place] 20/150 -2 20/60 -2   Dist ph [place] 20/150 +2 20/40 -1         Tonometry (Tonopen, 1:48 PM)       Right Left   Pressure 16 17         Pupils       Pupils Dark Light Shape React APD   Right PERRL 4 2 Round Brisk None   Left PERRL 4 2 Round Brisk None         Visual Fields       Left Right    Full Full         Extraocular Movement       Right Left    Full, Ortho Full, Ortho          Neuro/Psych     Oriented x3: Yes   Mood/Affect: Normal         Dilation     Both eyes: 1.0% Mydriacyl, 2.5% Phenylephrine @ 1:50 PM           Slit Lamp and Fundus Exam     Slit Lamp Exam       Right Left   Lids/Lashes Dermatochalasis - upper lid, Meibomian gland dysfunction Dermatochalasis - upper lid, Meibomian gland dysfunction   Conjunctiva/Sclera Melanosis Melanosis   Cornea Arcus, trace Punctate epithelial erosions, tear film debris, 1+ [place], Well healed temporal cataract wound Arcus, Inferior 1+ Punctate epithelial erosions, well healed cataract wound   Anterior Chamber Deep and quiet Deep;  no cell or flare   Iris Round and dilated, No NVI Round and dilated, No NVI   Lens PC IOL in good position, 2+ Posterior capsular opacification Posterior chamber intraocular lens in good position, 1-2+ PCO.   Anterior Vitreous Vitreous syneresis, vitreous condensations, silicone oil bubbles Vitreous syneresis, Vitreous condensations         Fundus Exam       Right Left   Disc Sharp rim, inferior notch, 3-4+ pallor, inf. Rim thinning, attenuated vessels superiorly, fine vascular loops superiorly 2+ pallor, Sharp rim, thin inferior rim, +cupping   C/D Ratio 0.85 0.75   Macula Blunted foveal reflex, trace cystic changes superior mac; stable improvement in DBH superior mac, central exudates, fibrosis and atrophy -- stable, no frank heme Flat, good foveal reflex, mild RPE mottling and clumping, Epiretinal membrane, No heme or edema   Vessels superior BRVO with severe attenuation of ST arterioles, Tortuous attenuated, mild tortuosity   Periphery Attached, DBH superior hemisphere extended from BRVO -- stably resolved, No heme Attached, mild scattered Reticular degeneration, No heme           IMAGING AND PROCEDURES  Imaging and Procedures for 03/07/18  OCT, Retina - OU - Both Eyes       Right Eye Quality was good. Central Foveal Thickness: 228. Progression has been  stable. Findings include no SRF, abnormal foveal contour, subretinal hyper-reflective material, intraretinal hyper-reflective material, epiretinal membrane, intraretinal fluid, outer retinal atrophy (stable release of central VMT to partial PVD, trace central cystic changes, central  SRHM and patchy ORA; diffuse retinal thinning -- stable).   Left Eye Quality was good. Central Foveal Thickness: 299. Progression has been stable. Findings include no IRF, no SRF, abnormal foveal contour, epiretinal membrane, vitreomacular adhesion (Blunted foveal depression -- stable).   Notes *Images captured and stored on drive  Diagnosis / Impression:  ERM OU OD: BRVO w/ CME -- stable release of central VMT to partial PVD, trace central cystic changes, central SRHM and patchy ORA; diffuse retinal thinning -- stable OS: very mild ERM with blunted foveal depression -- stable; mild VMA  Clinical management:  See below  Abbreviations: NFP - Normal foveal profile. CME - cystoid macular edema. PED - pigment epithelial detachment. IRF - intraretinal fluid. SRF - subretinal fluid. EZ - ellipsoid zone. ERM - epiretinal membrane. ORA - outer retinal atrophy. ORT - outer retinal tubulation. SRHM - subretinal hyper-reflective material       Intravitreal Injection, Pharmacologic Agent - OD - Right Eye       Time Out 11/17/2024. 2:51 PM. Confirmed correct patient, procedure, site, and patient consented.   Anesthesia Topical anesthesia was used. Anesthetic medications included Lidocaine  2%, Proparacaine 0.5%.   Procedure Preparation included 5% betadine to ocular surface, eyelid speculum. A (32g) needle was used.   Injection: 1.25 mg Bevacizumab  1.25mg /0.37ml   Route: Intravitreal, Site: Right Eye   NDC: [id number], Lot: [id number], Expiration date: 02/01/2025   Post-op Post injection exam found visual acuity of at least counting fingers. The patient tolerated the procedure well. There were no complications. The  patient received written and verbal post procedure care education.            ASSESSMENT/PLAN:   ICD-10-CM   1. Branch retinal vein occlusion of right eye with macular edema (HCC)  H34.8310 OCT, Retina - OU - Both Eyes    Intravitreal Injection, Pharmacologic Agent - OD - Right Eye    Bevacizumab  (AVASTIN ) SOLN 1.25 mg    2. Diabetes mellitus type 2 without retinopathy (HCC)  E11.9     3. Epiretinal membrane (ERM) of both eyes  H35.373     4. Pseudophakia of both eyes  Z96.1     5. Ocular hypertension of right eye  H40.051     6. Glaucoma suspect of both eyes  H40.003     7. Essential hypertension  I10     8. Hypertensive retinopathy of both eyes  H35.033      1. BRVO with CME OD  - delayed f/u - 7 wks instead of 6 (06.18.25 to 08.08.25) - lost to follow up from 02.22.22 to 11.1.22 -- re-presented with massive CME and BCVA 20/400 from 20/30  - initial OCT w/ CME superior macula - s/p IVA OD #1 (10.18.19), #2 (11.19.19), #3 (12.17.19), #4 (02.19.20), #5 (03.23.20), #6 (04.21.20), #7 (05.27.20), #8 (07.07.20), #9 (08.25.20), #10 (9.29.20), #11 (11.17.20), #12 (12.22.20), #13 (01.27.21), #14 (03.10.21), #15 (04.21.21), #16 (05.26.21), #17 (06.30.21), #18 (08.04.21), #19 (09.01.21), #20 (10.06.21), #21 (11.17.21), #22 (12.30.21), #23 (2.22.22), #24 (11.1.22), #25 (11.29.22), #26 (01.03.23), #27 (02.14.23), #28 (04.07.23), #29 (05.19.23), #30 (06.20.23), #31 (02.12.25), #32(03.25.25), #33 (05.06.25), #34 (06.18.25) #35(08.08.2025) #36(10.03.25) **history of worsening IRF/cystic changes at 6+ weeks interval, IVA** - s/p IVE OD #1 (08.11.23), #2 (09.08.23), #3 (10.06.23), #4 (11.06.23), #5 (12.13.23), #6 (01.24.24), #7 (03.27.24), #8 (05.08.24), #9 (06.19.24), #10 (07.31.24), #11 (09.11.24), #12 (10.30.24), #13 (12.18.24)  - BCVA OD 20/150--stable - OCT shows stable release of central VMT to partial PVD, trace central cystic changes, central SRHM and  patchy ORA; diffuse retinal thinning  -- stable at 7 weeks  - recommend IVA OD #37 today, 11.21.25 w/ f/u at 7 wks again   - [employer] funding unavailable  - RBA of procedure discussed, questions answered - informed consent obtained and signed - see procedure note   - Avastin  informed consent form signed and scanned on 02.12.25  - f/u in 7 weeks -- DFE/OCT, possible injection   2. Diabetes mellitus, type 2 without retinopathy - The incidence, risk factors for progression, natural history and treatment options for diabetic retinopathy were discussed with patient.   - The need for close monitoring of blood glucose, blood pressure, and serum lipids, avoiding cigarette or any type of tobacco, and the need for long term follow up was also discussed with patient.    3. Epiretinal membrane, OU  - relatively mild ERM OU -- blunted central foveal depression - OD with mild cystic changes 2/2 BRVO as above; OS without cystic changes or edema  - discussed findings and prognosis   - continue to monitor  4. Pseudophakia OU  - s/p CE/IOL OS 11.13.19 w/ Dr. Lelon  - s/p CE/IOL OD 01.16.20 w/ Dr. Lelon  - beautiful surgeries -- IOLs in perfect position  - healing well post-operatively - IRF/CME OD may have been partly due to post op CME ([place]) in addition to BRVO  5,6. Ocular hypertension / Glaucoma suspect OD>OS  - IOP 16,17  - denies any family hx of Glaucoma   - under the expert management of Dr. Fleeta  - most recent notes reviewed (04.30.24 visit w/ Dr. Fleeta)  - on Cosopt  BID OU and Brimonidine  TID OU per Dr. Fleeta  7,8. Hypertensive retinopathy OU - pt presented to 6.24.2020 visit urgently for right sided headache above right eye with extension to occiput - BP at that time was 201/100 -- pt was sent to primary care clinic for urgent evaluation and meds were adjusted  - BP now under better control and headaches improved  - BP reading 11.1.22 -- 180/84 -- advised f/u with PCP  - discussed importance of tight BP  control  Ophthalmic Meds Ordered this visit:  Meds ordered this encounter  Medications   Bevacizumab  (AVASTIN ) SOLN 1.25 mg     Return in about 7 weeks (around 01/05/2025) for BRVO OD, DFE, OCT, Possible Injxn.  There are no Patient Instructions on file for this visit.  This document serves as a record of services personally performed by Redell JUDITHANN Hans, MD, PhD. It was created on their behalf by Delon Newness COT, an ophthalmic technician. The creation of this record is the provider's dictation and/or activities during the visit.    Electronically signed by: Delon Newness COT 11.18.25  3:11 AM  This document serves as a record of services personally performed by Redell JUDITHANN Hans, MD, PhD. It was created on their behalf by Almetta Pesa, an ophthalmic technician. The creation of this record is the provider's dictation and/or activities during the visit.    Electronically signed by: Almetta Pesa, OA, 11/20/24  3:11 AM  Redell JUDITHANN Hans, M.D., Ph.D. Diseases & Surgery of the Retina and Vitreous [facility] 11/17/2024    I have reviewed the above documentation for accuracy and completeness, and I agree with the above. Redell JUDITHANN Hans, M.D., Ph.D. 11/20/24 3:12 AM   Abbreviations: M myopia (nearsighted); A astigmatism; H hyperopia (farsighted); P presbyopia; Mrx spectacle prescription;  CTL contact lenses; OD right eye; OS left  eye; OU both eyes  XT exotropia; ET esotropia; PEK punctate epithelial keratitis; PEE punctate epithelial erosions; DES dry eye syndrome; MGD meibomian gland dysfunction; ATs artificial tears; PFAT's preservative free artificial tears; NSC nuclear sclerotic cataract; PSC posterior subcapsular cataract; ERM epi-retinal membrane; PVD posterior vitreous detachment; RD retinal detachment; DM diabetes mellitus; DR diabetic retinopathy; NPDR non-proliferative diabetic retinopathy; PDR proliferative diabetic retinopathy; CSME clinically  significant macular edema; DME diabetic macular edema; dbh dot blot hemorrhages; CWS cotton wool spot; POAG primary open angle glaucoma; C/D cup-to-disc ratio; HVF humphrey visual field; GVF goldmann visual field; OCT optical coherence tomography; IOP intraocular pressure; BRVO Branch retinal vein occlusion; CRVO central retinal vein occlusion; CRAO central retinal artery occlusion; BRAO branch retinal artery occlusion; RT retinal tear; SB scleral buckle; PPV pars plana vitrectomy; VH Vitreous hemorrhage; PRP panretinal laser photocoagulation; IVK intravitreal kenalog ; VMT vitreomacular traction; MH Macular hole;  NVD neovascularization of the disc; NVE neovascularization elsewhere; AREDS age related eye disease study; ARMD age related macular degeneration; POAG primary open angle glaucoma; EBMD epithelial/anterior basement membrane dystrophy; ACIOL anterior chamber intraocular lens; IOL intraocular lens; PCIOL posterior chamber intraocular lens; Phaco/IOL phacoemulsification with intraocular lens placement; PRK photorefractive keratectomy; LASIK laser assisted in situ keratomileusis; HTN hypertension; DM diabetes mellitus; COPD chronic obstructive pulmonary disease

## 2024-11-17 ENCOUNTER — Encounter (INDEPENDENT_AMBULATORY_CARE_PROVIDER_SITE_OTHER): Payer: Self-pay | Admitting: Ophthalmology

## 2024-11-17 ENCOUNTER — Ambulatory Visit (INDEPENDENT_AMBULATORY_CARE_PROVIDER_SITE_OTHER): Admitting: Ophthalmology

## 2024-11-17 DIAGNOSIS — H35033 Hypertensive retinopathy, bilateral: Secondary | ICD-10-CM

## 2024-11-17 DIAGNOSIS — I10 Essential (primary) hypertension: Secondary | ICD-10-CM | POA: Diagnosis not present

## 2024-11-17 DIAGNOSIS — H35373 Puckering of macula, bilateral: Secondary | ICD-10-CM | POA: Diagnosis not present

## 2024-11-17 DIAGNOSIS — H34831 Tributary (branch) retinal vein occlusion, right eye, with macular edema: Secondary | ICD-10-CM

## 2024-11-17 DIAGNOSIS — E119 Type 2 diabetes mellitus without complications: Secondary | ICD-10-CM

## 2024-11-17 DIAGNOSIS — H40051 Ocular hypertension, right eye: Secondary | ICD-10-CM

## 2024-11-17 DIAGNOSIS — Z961 Presence of intraocular lens: Secondary | ICD-10-CM

## 2024-11-17 DIAGNOSIS — H40003 Preglaucoma, unspecified, bilateral: Secondary | ICD-10-CM

## 2024-11-17 MED ORDER — BEVACIZUMAB CHEMO INJECTION 1.25MG/0.05ML SYRINGE FOR KALEIDOSCOPE
1.2500 mg | INTRAVITREAL | Status: AC | PRN
  Administered 2024-11-17: 1.25 mg via INTRAVITREAL

## 2024-11-24 ENCOUNTER — Encounter: Payer: Self-pay | Admitting: Family Medicine

## 2024-12-12 NOTE — Progress Notes (Unsigned)
 No chief complaint on file.   HISTORY OF PRESENT ILLNESS:  12/12/2024 ALL:  Alejandra Davis returns for follow up for AD.   05/23/2024 ALL:  Alejandra Davis returns for follow up for AD. She was last seen 10/2023 and doing fairly well on memantine  XR 7mg  daily. She presents with her niece, Alejandra Davis, today who aids in history. Cognitive function seems fairly stable. More repetitive questioning at times. She has had more difficulty with sundowning. Seems more agitated about finances at times. Doesn't remember being retired. She continues to be independent with ADLs. She is able to take meds with assistance. Sleeping well. Alejandra Davis continues to sit with her during the day.   11/03/2023 ALL:  Alejandra Davis returns for follow up for AD. She was last seen 03/2023 and doing well on memantine  XR 7mg  daily. Since, she feels memory is stable. She presents with Alejandra Davis, caregiver, today. Daughter, Alejandra Davis, is with us  via telephone and reports she is doing fairly well. She does have more confusion at night. She will call her daughter on the phone but does not seem to wonder. They have cameras to monitor. She has a two caregivers during the day from about 8a-9p. She does stay alone at night. Her son, Alejandra Davis, and her niece live close by. She seems to be eating well. Caregivers prepare meals and assist with meds. She is usually able to perform ADLs. Sleep waxes and wanes. She is followed by PCP.   04/14/2023 ALL:  Alejandra Davis returns for follow up for AD. She continues memantine  XR 7mg  daily. Unable to tolerate higher dose. She presents with her friend who drove her to appt but waits in lobby. Alejandra Davis feels she is doing well. She reports taking medicaitons as listed on MAR, however, when specifically asked about memantine  she is not able to confirm she is taking. She reports being able to perform ADLs. She lives alone. She has a home care aide 10-5 W-F and 10-2 on M-T. Her daughter and son rotate every other weekend. She is sleeping well and appetite  seems normal. She loves to do crossword and word finding puzzles. She was going to [facility] but hasn't been recently. She does not drive.  10/05/2022 ALL: Alejandra Davis is a 88 y.o. female here today for follow up for AD. She was last seen by Alejandra Davis 02/2022 and memantine  XR increased to 14mg  daily. Unable to tolerate donepezil  in the past. She returns with her daughter, Alejandra Davis, who aids in history. Alejandra Davis reports some progression in short term memory loss. Alejandra Davis feels that she is doing fairly well. Alejandra Davis was hesitant to increase menantine dose. Alejandra Davis has continued 7mg  daily and tolerating well. She continues to live alone. She is independent in ADLs. She is able to manage medications without difficulty. She has a home care aide 10-5 W-F and 10-2 on M-T. Her daughter and son rotate every other weekend. She is sleeping well and appetite seems normal. She loves to do crossword and word finding puzzles. She was going to [place] but hasn't been recently. She does not drive.   HISTORY (copied from Alejandra Davis previous note)  Alejandra Davis is an 88 year old right-handed woman with an underlying medical history of diabetes, hypertension, branch retinal vein occlusion, macular edema, hearing loss, and hypothyroidism, who presents for follow-up consultation of her memory loss.  The patient is accompanied by her Aide, Alejandra Davis, today, and presents after a long gap of over 2 years.  I last saw her on 08/29/2019, at which time she  was on Aricept  5 mg daily, had occasional GI complaints including stomach cramping and nausea, no vomiting or diarrhea.  She had weight loss.  She was advised to follow-up with her primary care to make sure there is no medical reason for her weight loss.  She was advised to increase her donepezil  to 10 mg daily.  I had previously talked her about pursuing a sleep study but she did not wish to pursue this.   Her daughter called in the interim in September 2020 reporting that she significant side effects from  the donepezil  and she was therefore advised to taper off of it.   The patient had a follow-up appointment with Alejandra Mordan, NP on 11/28/2019, at which time she was accompanied by her niece.  Her side effects had resolved after stopping the donepezil .  Her MMSE was 23 at the time.  The addition of Namenda  was discussed but it was not started due to her family being concerned about side effects.    She did not return for follow-up since then.   She had an interim evaluation as per PCP request, with Alejandra. Maude Rattler, neuropsychologist, in February 2022 and his impression was <<Diagnostic Impressions: Alzheimer's disease, late onset Cerebrovascular small vessel disease>>. She had a feedback appointment with Alejandra. Rattler in February 2022.   She was advised no longer to drive.   Today, 03/19/2022: She reports feeling okay, she is not able to provide much in the way of details of her history.  She denies any pain, any depression or stress.  She has not fallen.  Per Alejandra Davis she does her own laundry and also cooks some.  Alejandra Davis is there from 10 AM to 2 PM daily, Mondays through Fridays, there is another caretaker that is there from 4 PM to 7 PM daily, nobody stays with her overnight.  There have been no reports of falls, no wandering, no behavioral escalations, sometimes patient gets frustrated at times if she does not remember something.  No anger outbursts.  She tries to hydrate well, drinks 8 ounce bottles of water, typically 3 bottles well Alejandra Davis is there.  She has not driven a car in the months as far as her caretaker knows.  Patient reports that she drove her car to the grocery store in the afternoon some 2 weeks ago. This is not confirmed by the caretaker.  I reviewed her office visit note with her primary care on 11/18/2021.  She had blood work at the time which I reviewed: BUN was elevated at 28, creatinine elevated at 1.37.  She had a total cholesterol of 216, thyroid  function was benign, LDL 112.  She was  started on memantine  ER 7 mg strength in late November 2022 as I understand.  She seems to tolerate this well, her caretaker reports occasional diarrhea.   REVIEW OF SYSTEMS: Out of a complete 14 system review of symptoms, the patient complains only of the following symptoms, memory loss, and all other reviewed systems are negative.   ALLERGIES: Allergies  Allergen Reactions   Donepezil  Hcl     Stomach cramps      HOME MEDICATIONS: Outpatient Medications Prior to Visit  Medication Sig Dispense Refill   amLODipine  (NORVASC ) 10 MG tablet TAKE 1 TABLET(10 MG) BY MOUTH DAILY 90 tablet 1   ASPIRIN  LOW DOSE 81 MG EC tablet Take 81 mg by mouth daily.     ketoconazole (NIZORAL) 2 % shampoo Apply topically.     levothyroxine  (SYNTHROID ) 75 MCG tablet Take  1 tablet (75 mcg total) by mouth daily. 90 tablet 1   lisinopril  (ZESTRIL ) 2.5 MG tablet Take 1 tablet (2.5 mg total) by mouth daily. 90 tablet 1   memantine  (NAMENDA  XR) 7 MG CP24 24 hr capsule Take 1 capsule (7 mg total) by mouth daily. 90 capsule 3   metoprolol  tartrate (LOPRESSOR ) 50 MG tablet TAKE 1 TABLET(50 MG) BY MOUTH DAILY 90 tablet 1   No facility-administered medications prior to visit.     PAST MEDICAL HISTORY: Past Medical History:  Diagnosis Date   Chicken pox    Cystocele    Diabetes mellitus without complication (HCC)    Glaucoma    Hypertension    Hypertensive retinopathy    OU   Neuromuscular disorder (HCC)    Dementia    Thyroid  disease    hypothyroidism     PAST SURGICAL HISTORY: Past Surgical History:  Procedure Laterality Date   ABDOMINAL HYSTERECTOMY  1990   TAH BSO   CATARACT EXTRACTION Bilateral    Alejandra. Lelon   COLONOSCOPY WITH PROPOFOL  N/A 04/08/2021   Procedure: COLONOSCOPY WITH PROPOFOL ;  Surgeon: Dianna Specking, MD;  Location: [facility];  Service: Endoscopy;  Laterality: N/A;   EYE SURGERY Bilateral    Cat Sx OU   OOPHORECTOMY     BSO   Vaginal Bx     Papilloma     FAMILY  HISTORY: Family History  Problem Relation Age of Onset   Hypertension Mother    Memory loss Mother    Cancer Father        LIVER   Heart disease Brother    Cancer Brother        STOMACH   Sickle cell anemia Other      SOCIAL HISTORY: Social History   Socioeconomic History   Marital status: Divorced    Spouse name: Not on file   Number of children: Not on file   Years of education: Not on file   Highest education level: Not on file  Occupational History   Not on file  Tobacco Use   Smoking status: Never   Smokeless tobacco: Never  Vaping Use   Vaping status: Never Used  Substance and Sexual Activity   Alcohol use: No   Drug use: No   Sexual activity: Not Currently    Birth control/protection: Surgical  Other Topics Concern   Not on file  Social History Narrative   Social History      Diet? n/a      Do you drink/eat things with caffeine? Yes   Marital status?      Divorced                               What year were you married? 1962      Do you live in a house, apartment, assisted living, condo, trailer, etc.? house      Is it one or more stories? One story      How many persons live in your home? 1      Do you have any pets in your home? (please list) none       Highest level of education completed? 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  sometimes                       Type & how often? Walking       Advanced Directives      Do you have a living will? No       Do you have a DNR form?                                  If not, do you want to discuss one?  No       Do you have signed POA/HPOA for forms?       Functional Status      Do you have difficulty bathing or dressing yourself? No       Do you have difficulty preparing food or eating? No       Do you have difficulty managing your medications? No        Do you have difficulty managing your finances? No       Do you have difficulty affording your medications? No       Social Drivers of Health   Tobacco Use: Low Risk (11/17/2024)   Patient History    Smoking Tobacco Use: Never    Smokeless Tobacco Use: Never    Passive Exposure: Not on file  Financial Resource Strain: Not on file  Food Insecurity: Not on file  Transportation Needs: Not on file  Physical Activity: Not on file  Stress: Not on file  Social Connections: Not on file  Intimate Partner Violence: Not on file  Depression (EYV7-0): Not on file  Alcohol Screen: Not on file  Housing: Not on file  Utilities: Not on file  Health Literacy: Not on file     PHYSICAL EXAM  There were no vitals filed for this visit.     There is no height or weight on file to calculate BMI.  Generalized: Well developed, in no acute distress  Cardiology: normal rate and rhythm, no murmur auscultated  Respiratory: clear to auscultation bilaterally    Neurological examination  Mentation: Alert, not oriented to time but is oriented to place and some history taking. Follows all commands speech and language fluent Cranial nerve II-XII: Pupils were equal round reactive to light. Extraocular movements were full, visual field were full on confrontational test. Facial sensation and strength were normal. Head turning and shoulder shrug  were normal and symmetric. Motor: The motor testing reveals 5 over 5 strength of bilateral upper ext, 4+/5 bilateral lowers. Good symmetric motor tone is noted throughout.  Sensory: Sensory testing is intact to soft touch on all 4 extremities. No evidence of extinction is noted.  Coordination: Cerebellar testing reveals good finger-nose-finger and heel-to-shin bilaterally.  Gait and station: Gait is normal.    DIAGNOSTIC DATA (LABS, IMAGING, TESTING) - I reviewed patient records, labs, notes, testing and imaging myself where available.  Lab Results   Component Value Date   WBC 6.6 11/06/2022   HGB 13.9 11/06/2022   HCT 43.2 11/06/2022   MCV 81.5 11/06/2022   PLT 261 11/06/2022      Component Value Date/Time   NA 142 11/06/2022 1520   K 3.9 11/06/2022 1520   CL 104 11/06/2022 1520   CO2 24 11/06/2022 1520   GLUCOSE 85 11/06/2022 1520   BUN 24 (H) 11/06/2022 1520   CREATININE 1.29 (H) 11/06/2022 1520   CREATININE 1.52 (H) 07/21/2021 9191  CALCIUM  10.5 (H) 11/06/2022 1520   PROT 7.4 07/22/2021 1932   ALBUMIN 3.8 07/22/2021 1932   AST 20 07/22/2021 1932   ALT 13 07/22/2021 1932   ALKPHOS 47 07/22/2021 1932   BILITOT 0.2 (L) 07/22/2021 1932   GFRNONAA 41 (L) 11/06/2022 1520   GFRNONAA 33 (L) 04/22/2021 1056   GFRAA 38 (L) 04/22/2021 1056   Lab Results  Component Value Date   CHOL 188 07/21/2021   HDL 74 07/21/2021   LDLCALC 97 07/21/2021   TRIG 80 07/21/2021   CHOLHDL 2.5 07/21/2021   Lab Results  Component Value Date   HGBA1C 5.9 (H) 07/21/2021   No results found for: VITAMINB12 Lab Results  Component Value Date   TSH 1.566 07/22/2021       05/23/2024    3:00 PM 11/03/2023   10:17 AM 04/14/2023   10:45 AM  MMSE - Mini Mental State Exam  Not completed: Unable to complete;Refused    Orientation to time  1 0  Orientation to Place  5 5  Registration  3 3  Attention/ Calculation  1 2  Recall  0 0  Language- name 2 objects  2 2  Language- repeat  1 1  Language- follow 3 step command  3 3  Language- read & follow direction  1 1  Write a sentence  1 1  Copy design  1 1  Total score  19 19         No data to display           ASSESSMENT AND PLAN  88 y.o. year old female  has a past medical history of Chicken pox, Cystocele, Diabetes mellitus without complication (HCC), Glaucoma, Hypertension, Hypertensive retinopathy, Neuromuscular disorder (HCC), and Thyroid  disease. here with    No diagnosis found.  SUNSET JOSHI is doing fairly well, today. We discussed intermittent sundowning. She  remains independent with ADLs but does have a health care aid that comes regularly and her children check on her on the weekends. She was unable to tolerate increased dose of memantine  and unable to tolerate donepezil . She will continue memantine  XR to 7mg  daily. Consider melatonin PRN or could consider adding escitalopram daily. Family will discuss and let me know. She will call for any concerns. Memory compensation strategies reviewed. Healthy lifestyle habits encouraged. She will follow up with PCP as directed. She will return to see me in 6-12 months, sooner if needed. She verbalizes understanding and agreement with this plan.    No orders of the defined types were placed in this encounter.    No orders of the defined types were placed in this encounter.   I spent 30 minutes of face-to-face and non-face-to-face time with patient.  This included previsit chart review, lab review, study review, order entry, electronic health record documentation, patient education.   Greig Forbes, MSN, FNP-C 12/12/2024, 12:54 PM  [facility] [address] [place], [place] [address] [phone]

## 2024-12-12 NOTE — Patient Instructions (Signed)
Below is our plan:  We will continue donepezil 10mg and memantine 10mg daily  Please make sure you are staying well hydrated. I recommend 50-60 ounces daily. Well balanced diet and regular exercise encouraged. Consistent sleep schedule with 6-8 hours recommended.   Please continue follow up with care team as directed.   Follow up with me in 1 year   You may receive a survey regarding today's visit. I encourage you to leave honest feed back as I do use this information to improve patient care. Thank you for seeing me today!   Management of Memory Problems   There are some general things you can do to help manage your memory problems.  Your memory may not in fact recover, but by using techniques and strategies you will be able to manage your memory difficulties better.   1)  Establish a routine. Try to establish and then stick to a regular routine.  By doing this, you will get used to what to expect and you will reduce the need to rely on your memory.  Also, try to do things at the same time of day, such as taking your medication or checking your calendar first thing in the morning. Think about think that you can do as a part of a regular routine and make a list.  Then enter them into a daily planner to remind you.  This will help you establish a routine.   2)  Organize your environment. Organize your environment so that it is uncluttered.  Decrease visual stimulation.  Place everyday items such as keys or cell phone in the same place every day (ie.  Basket next to front door) Use post it notes with a brief message to yourself (ie. Turn off light, lock the door) Use labels to indicate where things go (ie. Which cupboards are for food, dishes, etc.) Keep a notepad and pen by the telephone to take messages   3)  Memory Aids A diary or journal/notebook/daily planner Making a list (shopping list, chore list, to do list that needs to be done) Using an alarm as a reminder (kitchen timer or cell  phone alarm) Using cell phone to store information (Notes, Calendar, Reminders) Calendar/White board placed in a prominent position Post-it notes   In order for memory aids to be useful, you need to have good habits.  It's no good remembering to make a note in your journal if you don't remember to look in it.  Try setting aside a certain time of day to look in journal.   4)  Improving mood and managing fatigue. There may be other factors that contribute to memory difficulties.  Factors, such as anxiety, depression and tiredness can affect memory. Regular gentle exercise can help improve your mood and give you more energy. Exercise: there are short videos created by the National Institute on Health specially for older adults: https://bit.ly/2I30q97.  Mediterranean diet: which emphasizes fruits, vegetables, whole grains, legumes, fish, and other seafood; unsaturated fats such as olive oils; and low amounts of red meat, eggs, and sweets. A variation of this, called MIND (Mediterranean-DASH Intervention for Neurodegenerative Delay) incorporates the DASH (Dietary Approaches to Stop Hypertension) diet, which has been shown to lower high blood pressure, a risk factor for Alzheimer's disease. More information at: https://www.nia.nih.gov/health/what-do-we-know-about-diet-and-prevention-alzheimers-disease.  Aerobic exercise that improve heart health is also good for the mind.  National Institute on Aging have short videos for exercises that you can do at home: www.nia.nih.gov/Go4Life Simple relaxation techniques may help relieve   symptoms of anxiety Try to get back to completing activities or hobbies you enjoyed doing in the past. Learn to pace yourself through activities to decrease fatigue. Find out about some local support groups where you can share experiences with others. Try and achieve 7-8 hours of sleep at night.   Resources for Family/Caregiver  Online caregiver support groups can be found at  alz.org or call Alzheimer's Association's 24/7 hotline: 800.272.3900. [facility] offers in-person, virtual support groups and individual counseling for both care partners and persons with memory loss. Call for more information at [phone].   Advanced care plan: there are two types of Power of Attorney: healthcare and durable. Healthcare POA is a designated person to make healthcare decisions on your behalf if you were too sick to make them yourself. This person can be selected and documented by your physician. Durable POA has to be set up with a lawyer who takes charge of your finances and estate if you were too sick or cognitively impaired to manage your finances accurately. You can find a local Elder Law lawyer here: https://www.naela.org/.  Check out www.planyourlifespan.org, which will help you plan before a crisis and decide who will take care of life considerations in a circumstance where you may not be able to speak for yourself.   Helpful books (available on Amazon or your local bookstore):  By Dr. Ed Shaw: Keeping Love Alive as Memories Fade: The 5 Love Languages and the Alzheimer's Journey Sep 28, 2015 The Dementia Care Partner's Workbook: A Guide for Understanding, Education, and Hope Paperback - May 28, 2018.  Both available for less than $15.   "Coping with behavior change in dementia: a family caregiver's guide" by Beth Spencer & Laurie White "A Caregiver's Guide to Dementia: Using Activities and Other Strategies to Prevent, Reduce and Manage Behavioral Symptoms" by Laura N. Gitlin and Catherine Piersol.  "Creating Moments of Joy for the Person with Alzheimer's or Dementia" 4th edition by Jolene Brackrey  Caregiver videos on common behaviors related to dementia: https://www.uclahealth.org/dementia/caregiver-education-videos  [facility] Caregiver Portal: free to sign up, links to local resources: [email or web address]/login  

## 2024-12-13 ENCOUNTER — Encounter: Payer: Self-pay | Admitting: Family Medicine

## 2024-12-13 ENCOUNTER — Ambulatory Visit: Admitting: Family Medicine

## 2024-12-13 VITALS — BP 136/78 | HR 105 | Resp 16 | Ht 63.5 in | Wt 117.5 lb

## 2024-12-13 DIAGNOSIS — G301 Alzheimer's disease with late onset: Secondary | ICD-10-CM | POA: Diagnosis not present

## 2024-12-13 DIAGNOSIS — F0280 Dementia in other diseases classified elsewhere without behavioral disturbance: Secondary | ICD-10-CM | POA: Diagnosis not present

## 2024-12-29 NOTE — Progress Notes (Signed)
 " [facility] - Clinic Note  01/05/2025    CHIEF COMPLAINT Patient presents for Retina Follow Up  HISTORY OF PRESENT ILLNESS: Alejandra Davis is a 89 y.o. female who presents to the clinic today for:   HPI     Retina Follow Up   Patient presents with  CRVO/BRVO.  In right eye.  This started 7 weeks ago.  Duration of 7 weeks.  Since onset it is stable.        Comments   7 week retina follow up BRVO OD and IVA OD pt is reporting no vision changes noticed she denies any flashes or floaters pt is a little confused today on why she is here pt is accompanied by her driver who is unaware of her history pt is not using drops lately some times she will let caretaker put them in other times she will not on  Cosopt  BID OU and Brimonidine  TID she is not checking her blood sugar at this time will not let caregiver do it       Last edited by Resa Delon ORN, COT on 01/05/2025  2:06 PM.     Pt states issues with vision is so small she hardly notices.   Referring physician: No referring provider defined for this encounter.  HISTORICAL INFORMATION:   Selected notes from the MEDICAL RECORD NUMBER Referred by Dr. MICAEL Lent for DM exam   CURRENT MEDICATIONS: No current outpatient medications on file. (Ophthalmic Drugs)   No current facility-administered medications for this visit. (Ophthalmic Drugs)   Current Outpatient Medications (Other)  Medication Sig   amLODipine  (NORVASC ) 10 MG tablet TAKE 1 TABLET(10 MG) BY MOUTH DAILY (Patient taking differently: Take 20 mg by mouth daily. TAKE 1 TABLET(10 MG) BY MOUTH DAILY)   ASPIRIN  LOW DOSE 81 MG EC tablet Take 81 mg by mouth daily.   ketoconazole (NIZORAL) 2 % shampoo Apply topically.   levothyroxine  (SYNTHROID ) 75 MCG tablet Take 1 tablet (75 mcg total) by mouth daily.   lisinopril  (ZESTRIL ) 2.5 MG tablet Take 1 tablet (2.5 mg total) by mouth daily.   memantine  (NAMENDA  XR) 7 MG CP24 24 hr capsule Take 1 capsule (7 mg  total) by mouth daily.   metoprolol  tartrate (LOPRESSOR ) 50 MG tablet TAKE 1 TABLET(50 MG) BY MOUTH DAILY   No current facility-administered medications for this visit. (Other)   REVIEW OF SYSTEMS: ROS   Positive for: Skin, Genitourinary, Musculoskeletal, Endocrine, Cardiovascular, Eyes Negative for: Constitutional, Gastrointestinal, Neurological, HENT, Respiratory, Psychiatric, Allergic/Imm, Heme/Lymph Last edited by Resa Delon ORN, COT on 01/05/2025  1:53 PM.      ALLERGIES Allergies  Allergen Reactions   Donepezil      Other Reaction(s): unknown   Donepezil  Hcl     Stomach cramps    PAST MEDICAL HISTORY Past Medical History:  Diagnosis Date   Chicken pox    Cystocele    Diabetes mellitus without complication (HCC)    Glaucoma    Hypertension    Hypertensive retinopathy    OU   Neuromuscular disorder (HCC)    Dementia    Thyroid  disease    hypothyroidism   Past Surgical History:  Procedure Laterality Date   ABDOMINAL HYSTERECTOMY  1990   TAH BSO   CATARACT EXTRACTION Bilateral    Dr. Lelon   COLONOSCOPY WITH PROPOFOL  N/A 04/08/2021   Procedure: COLONOSCOPY WITH PROPOFOL ;  Surgeon: Dianna Specking, MD;  Location: [facility];  Service: Endoscopy;  Laterality: N/A;   EYE  SURGERY Bilateral    Cat Sx OU   OOPHORECTOMY     BSO   Vaginal Bx     Papilloma   FAMILY HISTORY Family History  Problem Relation Age of Onset   Hypertension Mother    Memory loss Mother    Cancer Father        LIVER   Heart disease Brother    Cancer Brother        STOMACH   Sickle cell anemia Other    SOCIAL HISTORY Social History   Tobacco Use   Smoking status: Never   Smokeless tobacco: Never  Vaping Use   Vaping status: Never Used  Substance Use Topics   Alcohol use: No   Drug use: No       OPHTHALMIC EXAM: Base Eye Exam     Visual Acuity (Snellen - Linear)       Right Left   Dist [place] 20/150 -2 20/70 -2   Dist ph [place] NI 20/50 -2         Tonometry  (Tonopen, 2:18 PM)       Right Left   Pressure 22 26  1  gtss brom / Cosopt          Pupils       Pupils Dark Light Shape React APD   Right PERRL 3 2 Round Brisk None   Left PERRL 3 2 Round Brisk None         Visual Fields       Left Right    Full Full         Extraocular Movement       Right Left    Full, Ortho Full, Ortho         Neuro/Psych     Oriented x3: Yes   Mood/Affect: Normal         Dilation     Both eyes: 2.5% Phenylephrine @ 2:18 PM           Slit Lamp and Fundus Exam     Slit Lamp Exam       Right Left   Lids/Lashes Dermatochalasis - upper lid, Meibomian gland dysfunction Dermatochalasis - upper lid, Meibomian gland dysfunction   Conjunctiva/Sclera Melanosis Melanosis   Cornea Arcus, trace Punctate epithelial erosions, tear film debris, 1+ [place], Well healed temporal cataract wound Arcus, Inferior 1+ Punctate epithelial erosions, well healed cataract wound   Anterior Chamber Deep and quiet Deep;  no cell or flare   Iris Round and dilated, No NVI Round and dilated, No NVI   Lens PC IOL in good position, 2+ Posterior capsular opacification Posterior chamber intraocular lens in good position, 1-2+ PCO.   Anterior Vitreous Vitreous syneresis, vitreous condensations, silicone oil bubbles Vitreous syneresis, Vitreous condensations         Fundus Exam       Right Left   Disc Sharp rim, inferior notch, 3-4+ pallor, inf. Rim thinning, attenuated vessels superiorly, fine vascular loops superiorly 2+ pallor, Sharp rim, thin inferior rim, +cupping   C/D Ratio 0.85 0.75   Macula Blunted foveal reflex, trace cystic changes superior mac; stable improvement in DBH superior mac, central exudates, fibrosis and atrophy -- stable, no frank heme Flat, good foveal reflex, mild RPE mottling and clumping, Epiretinal membrane, No heme or edema   Vessels superior BRVO with severe attenuation of ST arterioles, Tortuous attenuated, mild tortuosity    Periphery Attached, DBH superior hemisphere extended from BRVO -- stably resolved, No heme Attached, mild scattered  Reticular degeneration, No heme           IMAGING AND PROCEDURES  Imaging and Procedures for 03/07/18  OCT, Retina - OU - Both Eyes       Right Eye Quality was good. Central Foveal Thickness: 228. Progression has been stable. Findings include no SRF, abnormal foveal contour, subretinal hyper-reflective material, intraretinal hyper-reflective material, epiretinal membrane, intraretinal fluid, outer retinal atrophy (stable release of central VMT to partial PVD, trace central cystic changes, central SRHM and patchy ORA; diffuse retinal thinning -- stable).   Left Eye Quality was good. Central Foveal Thickness: 299. Progression has been stable. Findings include no IRF, no SRF, abnormal foveal contour, epiretinal membrane, vitreomacular adhesion (Blunted foveal depression -- stable).   Notes *Images captured and stored on drive  Diagnosis / Impression:  ERM OU OD: BRVO w/ CME -- stable release of central VMT to partial PVD, trace central cystic changes, central SRHM and patchy ORA; diffuse retinal thinning -- stable OS: very mild ERM with blunted foveal depression -- stable; mild VMA  Clinical management:  See below  Abbreviations: NFP - Normal foveal profile. CME - cystoid macular edema. PED - pigment epithelial detachment. IRF - intraretinal fluid. SRF - subretinal fluid. EZ - ellipsoid zone. ERM - epiretinal membrane. ORA - outer retinal atrophy. ORT - outer retinal tubulation. SRHM - subretinal hyper-reflective material              ASSESSMENT/PLAN:   ICD-10-CM   1. Branch retinal vein occlusion of right eye with macular edema (HCC)  H34.8310 OCT, Retina - OU - Both Eyes    2. Diabetes mellitus type 2 without retinopathy (HCC)  E11.9     3. Epiretinal membrane (ERM) of both eyes  H35.373     4. Pseudophakia of both eyes  Z96.1     5. Ocular hypertension  of right eye  H40.051     6. Glaucoma suspect of both eyes  H40.003     7. Essential hypertension  I10     8. Hypertensive retinopathy of both eyes  H35.033       1. BRVO with CME OD  - delayed f/u - 7 wks instead of 6 (06.18.25 to 08.08.25) - lost to follow up from 02.22.22 to 11.1.22 -- re-presented with massive CME and BCVA 20/400 from 20/30  - initial OCT w/ CME superior macula - s/p IVA OD #1 (10.18.19), #2 (11.19.19), #3 (12.17.19), #4 (02.19.20), #5 (03.23.20), #6 (04.21.20), #7 (05.27.20), #8 (07.07.20), #9 (08.25.20), #10 (9.29.20), #11 (11.17.20), #12 (12.22.20), #13 (01.27.21), #14 (03.10.21), #15 (04.21.21), #16 (05.26.21), #17 (06.30.21), #18 (08.04.21), #19 (09.01.21), #20 (10.06.21), #21 (11.17.21), #22 (12.30.21), #23 (2.22.22), #24 (11.1.22), #25 (11.29.22), #26 (01.03.23), #27 (02.14.23), #28 (04.07.23), #29 (05.19.23), #30 (06.20.23), #31 (02.12.25), #32(03.25.25), #33 (05.06.25), #34 (06.18.25) #35(08.08.2025) #36(10.03.25)#37(11.21.25), **history of worsening IRF/cystic changes at 6+ weeks interval, IVA** - s/p IVE OD #1 (08.11.23), #2 (09.08.23), #3 (10.06.23), #4 (11.06.23), #5 (12.13.23), #6 (01.24.24), #7 (03.27.24), #8 (05.08.24), #9 (06.19.24), #10 (07.31.24), #11 (09.11.24), #12 (10.30.24), #13 (12.18.24)  - BCVA OD 20/150--stable - OCT shows stable release of central VMT to partial PVD, trace central cystic changes, central SRHM and patchy ORA; diffuse retinal thinning -- stable at 7 weeks  - recommend IVA OD #38 today, 01.09.26 w/ f/u at 7 wks again   - Good Days funding unavailable  - RBA of procedure discussed, questions answered - informed consent obtained and signed - see procedure note   - Avastin  informed consent form  signed and scanned on 02.12.25  - f/u in 7 weeks -- DFE/OCT, possible injection   2. Diabetes mellitus, type 2 without retinopathy - The incidence, risk factors for progression, natural history and treatment options for diabetic  retinopathy were discussed with patient.   - The need for close monitoring of blood glucose, blood pressure, and serum lipids, avoiding cigarette or any type of tobacco, and the need for long term follow up was also discussed with patient.    3. Epiretinal membrane, OU  - relatively mild ERM OU -- blunted central foveal depression - OD with mild cystic changes 2/2 BRVO as above; OS without cystic changes or edema  - discussed findings and prognosis   - continue to monitor  4. Pseudophakia OU  - s/p CE/IOL OS 11.13.19 w/ Dr. Lelon  - s/p CE/IOL OD 01.16.20 w/ Dr. Lelon  - beautiful surgeries -- IOLs in perfect position  - healing well post-operatively - IRF/CME OD may have been partly due to post op CME ([place]) in addition to BRVO  5,6. Ocular hypertension / Glaucoma suspect OD>OS  - IOP 22,26  - denies any family hx of Glaucoma   - under the expert management of Dr. Fleeta  - most recent notes reviewed (04.30.24 visit w/ Dr. Fleeta)  - on Cosopt  BID OU and Brimonidine  TID OU per Dr. Fleeta  7,8. Hypertensive retinopathy OU - pt presented to 6.24.2020 visit urgently for right sided headache above right eye with extension to occiput - BP at that time was 201/100 -- pt was sent to primary care clinic for urgent evaluation and meds were adjusted  - BP now under better control and headaches improved  - BP reading 11.1.22 -- 180/84 -- advised f/u with PCP  - discussed importance of tight BP control  Ophthalmic Meds Ordered this visit:  No orders of the defined types were placed in this encounter.    No follow-ups on file.  There are no Patient Instructions on file for this visit.  This document serves as a record of services personally performed by Redell JUDITHANN Hans, MD, PhD. It was created on their behalf by Delon Newness COT, an ophthalmic technician. The creation of this record is the provider's dictation and/or activities during the visit.    Electronically signed by:  Delon Newness COT 01.02.26 2:40 PM   This document serves as a record of services personally performed by Redell JUDITHANN Hans, MD, PhD. It was created on their behalf by Almetta Pesa, an ophthalmic technician. The creation of this record is the provider's dictation and/or activities during the visit.    Electronically signed by: Almetta Pesa, OA, 01/05/2025  2:40 PM  Abbreviations: M myopia (nearsighted); A astigmatism; H hyperopia (farsighted); P presbyopia; Mrx spectacle prescription;  CTL contact lenses; OD right eye; OS left eye; OU both eyes  XT exotropia; ET esotropia; PEK punctate epithelial keratitis; PEE punctate epithelial erosions; DES dry eye syndrome; MGD meibomian gland dysfunction; ATs artificial tears; PFAT's preservative free artificial tears; NSC nuclear sclerotic cataract; PSC posterior subcapsular cataract; ERM epi-retinal membrane; PVD posterior vitreous detachment; RD retinal detachment; DM diabetes mellitus; DR diabetic retinopathy; NPDR non-proliferative diabetic retinopathy; PDR proliferative diabetic retinopathy; CSME clinically significant macular edema; DME diabetic macular edema; dbh dot blot hemorrhages; CWS cotton wool spot; POAG primary open angle glaucoma; C/D cup-to-disc ratio; HVF humphrey visual field; GVF goldmann visual field; OCT optical coherence tomography; IOP intraocular pressure; BRVO Branch retinal vein occlusion; CRVO central retinal vein occlusion; CRAO central retinal  artery occlusion; BRAO branch retinal artery occlusion; RT retinal tear; SB scleral buckle; PPV pars plana vitrectomy; VH Vitreous hemorrhage; PRP panretinal laser photocoagulation; IVK intravitreal kenalog ; VMT vitreomacular traction; MH Macular hole;  NVD neovascularization of the disc; NVE neovascularization elsewhere; AREDS age related eye disease study; ARMD age related macular degeneration; POAG primary open angle glaucoma; EBMD epithelial/anterior basement membrane dystrophy; ACIOL  anterior chamber intraocular lens; IOL intraocular lens; PCIOL posterior chamber intraocular lens; Phaco/IOL phacoemulsification with intraocular lens placement; PRK photorefractive keratectomy; LASIK laser assisted in situ keratomileusis; HTN hypertension; DM diabetes mellitus; COPD chronic obstructive pulmonary disease "

## 2025-01-05 ENCOUNTER — Encounter (INDEPENDENT_AMBULATORY_CARE_PROVIDER_SITE_OTHER): Payer: Self-pay | Admitting: Ophthalmology

## 2025-01-05 ENCOUNTER — Ambulatory Visit (INDEPENDENT_AMBULATORY_CARE_PROVIDER_SITE_OTHER): Admitting: Ophthalmology

## 2025-01-05 DIAGNOSIS — H35373 Puckering of macula, bilateral: Secondary | ICD-10-CM

## 2025-01-05 DIAGNOSIS — I10 Essential (primary) hypertension: Secondary | ICD-10-CM

## 2025-01-05 DIAGNOSIS — H40051 Ocular hypertension, right eye: Secondary | ICD-10-CM | POA: Diagnosis not present

## 2025-01-05 DIAGNOSIS — H40003 Preglaucoma, unspecified, bilateral: Secondary | ICD-10-CM

## 2025-01-05 DIAGNOSIS — H35033 Hypertensive retinopathy, bilateral: Secondary | ICD-10-CM | POA: Diagnosis not present

## 2025-01-05 DIAGNOSIS — E119 Type 2 diabetes mellitus without complications: Secondary | ICD-10-CM

## 2025-01-05 DIAGNOSIS — Z961 Presence of intraocular lens: Secondary | ICD-10-CM

## 2025-01-05 DIAGNOSIS — H34831 Tributary (branch) retinal vein occlusion, right eye, with macular edema: Secondary | ICD-10-CM | POA: Diagnosis not present

## 2025-01-06 ENCOUNTER — Encounter (INDEPENDENT_AMBULATORY_CARE_PROVIDER_SITE_OTHER): Payer: Self-pay | Admitting: Ophthalmology

## 2025-01-07 MED ORDER — BEVACIZUMAB CHEMO INJECTION 1.25MG/0.05ML SYRINGE FOR KALEIDOSCOPE
1.2500 mg | INTRAVITREAL | Status: AC | PRN
  Administered 2025-01-07: 1.25 mg via INTRAVITREAL

## 2025-02-23 ENCOUNTER — Encounter (INDEPENDENT_AMBULATORY_CARE_PROVIDER_SITE_OTHER): Admitting: Ophthalmology

## 2025-06-13 ENCOUNTER — Ambulatory Visit: Admitting: Neurology
